# Patient Record
Sex: Female | Born: 1935
Health system: Southern US, Community
[De-identification: ages and names within clinical notes are randomized; demographics above are authoritative.]

## PROBLEM LIST (undated history)

## (undated) DIAGNOSIS — G629 Polyneuropathy, unspecified: Secondary | ICD-10-CM

## (undated) DIAGNOSIS — E119 Type 2 diabetes mellitus without complications: Secondary | ICD-10-CM

## (undated) DIAGNOSIS — J45909 Unspecified asthma, uncomplicated: Secondary | ICD-10-CM

## (undated) DIAGNOSIS — Z8709 Personal history of other diseases of the respiratory system: Secondary | ICD-10-CM

## (undated) DIAGNOSIS — K5730 Diverticulosis of large intestine without perforation or abscess without bleeding: Secondary | ICD-10-CM

## (undated) DIAGNOSIS — K635 Polyp of colon: Secondary | ICD-10-CM

## (undated) DIAGNOSIS — E039 Hypothyroidism, unspecified: Secondary | ICD-10-CM

## (undated) DIAGNOSIS — N183 Chronic kidney disease, stage 3 unspecified: Secondary | ICD-10-CM

## (undated) DIAGNOSIS — H409 Unspecified glaucoma: Secondary | ICD-10-CM

## (undated) DIAGNOSIS — I639 Cerebral infarction, unspecified: Secondary | ICD-10-CM

## (undated) DIAGNOSIS — R011 Cardiac murmur, unspecified: Secondary | ICD-10-CM

## (undated) DIAGNOSIS — M1990 Unspecified osteoarthritis, unspecified site: Secondary | ICD-10-CM

## (undated) DIAGNOSIS — I10 Essential (primary) hypertension: Secondary | ICD-10-CM

## (undated) DIAGNOSIS — Z972 Presence of dental prosthetic device (complete) (partial): Secondary | ICD-10-CM

## (undated) DIAGNOSIS — E785 Hyperlipidemia, unspecified: Secondary | ICD-10-CM

## (undated) DIAGNOSIS — Z974 Presence of external hearing-aid: Secondary | ICD-10-CM

## (undated) DIAGNOSIS — K8020 Calculus of gallbladder without cholecystitis without obstruction: Secondary | ICD-10-CM

## (undated) DIAGNOSIS — Z794 Long term (current) use of insulin: Secondary | ICD-10-CM

## (undated) DIAGNOSIS — F419 Anxiety disorder, unspecified: Secondary | ICD-10-CM

## (undated) DIAGNOSIS — D649 Anemia, unspecified: Secondary | ICD-10-CM

## (undated) DIAGNOSIS — Z8719 Personal history of other diseases of the digestive system: Secondary | ICD-10-CM

## (undated) DIAGNOSIS — K8050 Calculus of bile duct without cholangitis or cholecystitis without obstruction: Secondary | ICD-10-CM

## (undated) DIAGNOSIS — K259 Gastric ulcer, unspecified as acute or chronic, without hemorrhage or perforation: Secondary | ICD-10-CM

## (undated) HISTORY — DX: Hypothyroidism, unspecified: E03.9

## (undated) HISTORY — DX: Unspecified glaucoma: H40.9

## (undated) HISTORY — PX: ABDOMINAL HYSTERECTOMY: SHX81

## (undated) HISTORY — DX: Polyp of colon: K63.5

## (undated) HISTORY — PX: OTHER SURGICAL HISTORY: SHX169

## (undated) HISTORY — DX: Gastric ulcer, unspecified as acute or chronic, without hemorrhage or perforation: K25.9

## (undated) HISTORY — DX: Cerebral infarction, unspecified: I63.9

## (undated) HISTORY — DX: Unspecified asthma, uncomplicated: J45.909

---

## 2006-11-10 ENCOUNTER — Encounter: Payer: Self-pay | Admitting: Gastroenterology

## 2007-01-09 ENCOUNTER — Encounter: Payer: Self-pay | Admitting: Gastroenterology

## 2007-01-26 ENCOUNTER — Encounter: Payer: Self-pay | Admitting: Gastroenterology

## 2007-05-03 ENCOUNTER — Emergency Department (HOSPITAL_COMMUNITY): Admission: EM | Admit: 2007-05-03 | Discharge: 2007-05-03 | Payer: Self-pay | Admitting: Emergency Medicine

## 2008-09-24 ENCOUNTER — Encounter: Admission: RE | Admit: 2008-09-24 | Discharge: 2008-09-24 | Payer: Self-pay | Admitting: Family Medicine

## 2008-10-08 ENCOUNTER — Encounter: Admission: RE | Admit: 2008-10-08 | Discharge: 2008-10-08 | Payer: Self-pay | Admitting: Family Medicine

## 2008-12-20 ENCOUNTER — Encounter: Admission: RE | Admit: 2008-12-20 | Discharge: 2008-12-20 | Payer: Self-pay | Admitting: Family Medicine

## 2008-12-31 ENCOUNTER — Encounter: Admission: RE | Admit: 2008-12-31 | Discharge: 2008-12-31 | Payer: Self-pay | Admitting: Family Medicine

## 2009-01-14 ENCOUNTER — Encounter: Admission: RE | Admit: 2009-01-14 | Discharge: 2009-01-14 | Payer: Self-pay | Admitting: Neurosurgery

## 2009-07-28 ENCOUNTER — Encounter: Admission: RE | Admit: 2009-07-28 | Discharge: 2009-07-28 | Payer: Self-pay | Admitting: Family Medicine

## 2009-07-30 HISTORY — PX: CARDIOVASCULAR STRESS TEST: SHX262

## 2009-09-22 ENCOUNTER — Encounter: Admission: RE | Admit: 2009-09-22 | Discharge: 2009-09-22 | Payer: Self-pay | Admitting: Neurosurgery

## 2010-05-06 ENCOUNTER — Encounter: Admission: RE | Admit: 2010-05-06 | Discharge: 2010-05-06 | Payer: Self-pay | Admitting: Neurosurgery

## 2010-05-20 ENCOUNTER — Encounter: Admission: RE | Admit: 2010-05-20 | Discharge: 2010-05-20 | Payer: Self-pay | Admitting: Neurosurgery

## 2010-08-17 ENCOUNTER — Encounter (INDEPENDENT_AMBULATORY_CARE_PROVIDER_SITE_OTHER): Payer: Self-pay | Admitting: *Deleted

## 2010-08-25 ENCOUNTER — Encounter: Admission: RE | Admit: 2010-08-25 | Discharge: 2010-08-25 | Payer: Self-pay | Admitting: Family Medicine

## 2010-09-14 ENCOUNTER — Encounter (INDEPENDENT_AMBULATORY_CARE_PROVIDER_SITE_OTHER): Payer: Self-pay | Admitting: *Deleted

## 2010-09-16 ENCOUNTER — Encounter (INDEPENDENT_AMBULATORY_CARE_PROVIDER_SITE_OTHER): Payer: Self-pay | Admitting: *Deleted

## 2010-09-16 ENCOUNTER — Encounter: Admission: RE | Admit: 2010-09-16 | Discharge: 2010-09-16 | Payer: Self-pay | Admitting: Family Medicine

## 2010-09-16 ENCOUNTER — Ambulatory Visit: Payer: Self-pay | Admitting: Gastroenterology

## 2010-09-30 ENCOUNTER — Ambulatory Visit: Payer: Self-pay | Admitting: Gastroenterology

## 2010-12-20 ENCOUNTER — Encounter: Payer: Self-pay | Admitting: Family Medicine

## 2010-12-20 ENCOUNTER — Encounter: Payer: Self-pay | Admitting: Neurosurgery

## 2010-12-30 NOTE — Letter (Signed)
Summary: Diabetic Instructions  [facility]  [place], [place] [address]   Phone: [phone]  Fax: [phone]    Diana Harper [birth year] MRN: [id number]     X  _   INSULIN PUMP MEDICATION INSTRUCTIONS  We will contact the physician managing your diabetic care for written dosage instructions for the day before your procedure and the day of your procedure.  Once we have received the instructions, we will contact you.

## 2010-12-30 NOTE — Letter (Signed)
Summary: [facility] Instructions  [facility]  [place], [place] [address]   Phone: [phone]  Fax: [phone]       Diana Harper    1936/01/17    MRN: [id number]        Procedure Day Sudie Grumbling: Wednesday 09/30/10     Arrival Time: 9:30 AM      Procedure Time: 10:30 AM     Location of Procedure:                    _X_  [facility] (4th Floor)              [place]   Starting 5 days prior to your procedure _Friday 10/28/11_ do not eat nuts, seeds, popcorn, corn, beans, peas,  salads, or any raw vegetables.  Do not take any fiber supplements (e.g. Metamucil, Citrucel, and Benefiber).  THE DAY BEFORE YOUR PROCEDURE         DATE:  09/29/10  DAY: Tuesday    1.  Drink clear liquids the entire day-NO SOLID FOOD  2.  Do not drink anything colored red or purple.  Avoid juices with pulp.  No orange juice.  3.  Drink at least 64 oz. (8 glasses) of fluid/clear liquids during the day to prevent dehydration and help the prep work efficiently.  CLEAR LIQUIDS INCLUDE: Water Jello Ice Popsicles Tea (sugar ok, no milk/cream) Powdered fruit flavored drinks Coffee (sugar ok, no milk/cream) Gatorade Juice: apple, white grape, white cranberry  Lemonade Clear bullion, consomm, broth Carbonated beverages (any kind) Strained chicken noodle soup Hard Candy                             4.  In the morning, mix first dose of MoviPrep solution:    Empty 1 Pouch A and 1 Pouch B into the disposable container    Add lukewarm drinking water to the top line of the container. Mix to dissolve    Refrigerate (mixed solution should be used within 24 hrs)  5.  Begin drinking the prep at 5:00 p.m. The MoviPrep container is divided by 4 marks.   Every 15 minutes drink the solution down to the next mark (approximately 8 oz) until the full liter is complete.   6.  Follow completed prep with 16 oz of clear liquid of your choice  (Nothing red or purple).  Continue to drink clear liquids until bedtime.  7.  Before going to bed, mix second dose of MoviPrep solution:    Empty 1 Pouch A and 1 Pouch B into the disposable container    Add lukewarm drinking water to the top line of the container. Mix to dissolve    Refrigerate  THE DAY OF YOUR PROCEDURE      DATE: 09/30/10   DAY: Wednesday   Beginning at 5:30 a.m. (5 hours before procedure):         1. Every 15 minutes, drink the solution down to the next mark (approx 8 oz) until the full liter is complete.  2. Follow completed prep with 16 oz. of clear liquid of your choice.    3. You may drink clear liquids until 8:30 AM (2 HOURS BEFORE PROCEDURE).   MEDICATION INSTRUCTIONS  Unless otherwise instructed, you should take regular prescription medications with a small sip of water   as early as possible the morning of your procedure.  Diabetic patients - see separate instructions.   Additional medication instructions: Hold HCTZ the morning  of procedure.         OTHER INSTRUCTIONS  You will need a responsible adult at least 73 years of age to accompany you and drive you home.   This person must remain in the waiting room during your procedure.  Wear loose fitting clothing that is easily removed.  Leave jewelry and other valuables at home.  However, you may wish to bring a book to read or  an iPod/MP3 player to listen to music as you wait for your procedure to start.  Remove all body piercing jewelry and leave at home.  Total time from sign-in until discharge is approximately 2-3 hours.  You should go home directly after your procedure and rest.  You can resume normal activities the  day after your procedure.  The day of your procedure you should not:   Drive   Make legal decisions   Operate machinery   Drink alcohol   Return to work  You will receive specific instructions about eating, activities and medications before you  leave.    The above instructions have been reviewed and explained to me by   Emerson Monte RN  September 16, 2010 8:47 AM     I fully understand and can verbalize these instructions _____________________________ Date _________

## 2010-12-30 NOTE — Miscellaneous (Signed)
Summary: [facility] Previsit/prep  Clinical Lists Changes  Medications: Added new medication of MOVIPREP 100 GM  SOLR (PEG-KCL-NACL-NASULF-NA ASC-C) As per prep instructions. - Signed Rx of MOVIPREP 100 GM  SOLR (PEG-KCL-NACL-NASULF-NA ASC-C) As per prep instructions.;  #1 x 0;  Signed;  Entered by: Emerson Monte RN;  Authorized by: Sable Feil MD [facility];  Method used: Electronically to [facility] [address], [place], [place], [place]  [address], Ph: [phone], Fax: [phone] Allergies: Added new allergy or adverse reaction of * LATEX Added new allergy or adverse reaction of ZOCOR Added new allergy or adverse reaction of TETRACYCLINE Added new allergy or adverse reaction of ACCROMYCIN Observations: Added new observation of NKA: F (09/16/2010 8:03)    Prescriptions: MOVIPREP 100 GM  SOLR (PEG-KCL-NACL-NASULF-NA ASC-C) As per prep instructions.  #1 x 0   Entered by:   Emerson Monte RN   Authorized by:   Sable Feil MD [facility]   Signed by:   Emerson Monte RN on 09/16/2010   Method used:   Electronically to        [facility] (retail)       [address]       [place], [place]  [address]       Ph: [phone]       Fax: [phone]   RxID:   [id number]

## 2010-12-30 NOTE — Procedures (Signed)
Summary: Colonoscopy  Patient: Diana Harper Note: All result statuses are Final unless otherwise noted.  Tests: (1) Colonoscopy (COL)   COL Colonoscopy           [facility]     [place], [place]  [id number]           COLONOSCOPY PROCEDURE REPORT           PATIENT:  Diana, Harper  MR#:  [id number]     BIRTHDATE:  [birth year], 84 yrs. old  GENDER:  female     ENDOSCOPIST:  Loralee Pacas. Sharlett Iles, MD, [facility]     REF. BY:  Rachell Cipro, M.D.     PROCEDURE DATE:  09/30/2010     PROCEDURE:  Higher-risk screening colonoscopy G0105           ASA CLASS:  Class III     INDICATIONS:  history of polyps RECENT DIVERTICULITIS.     MEDICATIONS:   Fentanyl 75 mcg IV, Versed 8 mg IV           DESCRIPTION OF PROCEDURE:   After the risks benefits and     alternatives of the procedure were thoroughly explained, informed     consent was obtained.  Digital rectal exam was performed and     revealed no abnormalities.   The LB CF-[id number] endoscope     was introduced through the anus and advanced to the cecum, which     was identified by both the appendix and ileocecal valve, without     limitations.  The quality of the prep was excellent, using     MoviPrep.  The instrument was then slowly withdrawn as the colon     was fully examined.     <<PROCEDUREIMAGES>>           FINDINGS:  Moderate diverticulosis was found in the sigmoid to     descending colon segments.  No polyps or cancers were seen.     Retroflexed views in the rectum revealed no abnormalities.    The     scope was then withdrawn from the patient and the procedure     completed.           COMPLICATIONS:  None     ENDOSCOPIC IMPRESSION:     1) Moderate diverticulosis in the sigmoid to descending colon     segments     2) No polyps or cancers     RECOMMENDATIONS:     1) high fiber diet     2) Continue current colorectal screening recommendations for     "routine risk" patients with a repeat  colonoscopy in 10 years.     REPEAT EXAM:  No           ______________________________     Loralee Pacas. Sharlett Iles, MD, [facility]           CC:           n.     eSIGNED:   Loralee Pacas. Aundreya Souffrant at 09/30/2010 10:51 AM           [name], [id number]  Note: An exclamation mark (!) indicates a result that was not dispersed into the flowsheet. Document Creation Date: 09/30/2010 10:52 AM _______________________________________________________________________  (1) Order result status: Final Collection or observation date-time: 09/30/2010 10:43 Requested date-time:  Receipt date-time:  Reported date-time:  Referring Physician:   Ordering Physician: Verl Blalock ([phone]) Specimen Source:  Source: Tawanna Cooler Order Number: [id number] Lab site:   Appended Document: Colonoscopy    Clinical Lists Changes  Observations: Added new observation of COLONNXTDUE: 09/2020 (09/30/2010 12:04)

## 2010-12-30 NOTE — Procedures (Signed)
Summary: [facility]  [facility]   Imported By: Bubba Hales 10/01/2010 10:17:58  _____________________________________________________________________  External Attachment:    Type:   Image     Comment:   External Document

## 2010-12-30 NOTE — Letter (Signed)
Summary: Insulin pump letter-Colonoscopy  [facility]  [place], [place] [address]   Phone: [phone]  Fax: [phone]      Date: September 16, 2010  Re: Diana Harper DOB: 12/21/1935 MRN: [id number]     Dear Dr. Ernie Hew:   Dr. Sharlett Iles has scheduled the above patient for a colonoscopy at 10:30 am on      October 01, 2010.  Our records show that she is on insulin therapy via an insulin pump.  Our colonoscopy prep protocol requires that:   the patient must be on a clear liquid diet the entire day prior to the procedure date as well as the morning of the procedure   the patient must be NPO for 2 hours prior to the procedure    the patient must consume a PEG 3350 solution to prepare for the procedure.  Please advise [name] of any adjustments that need to be made to the patient's insulin pump therapy prior to the above procedure date.    Please fax this completed form to [facility] [place] at [phone].    If you have any question, please call me at [phone].  Thank you for your help with this matter.  Sincerely,  Abelino Derrick, CMA Deborra Medina)    Physician Recommendation:  ________________________________________________  ________________________________________________________________________  ________________________________________________________________________  ________________________________________________________________________  Appended Document: Insulin pump letter-Colonoscopy Left a message on for the nurse to call me back about the letter.   Appended Document: Insulin pump letter-Colonoscopy letter with adjustments sent down to be scanned and a copy for [name] chart given to [employer]. I called pt to make sure she understood the adjustments she does.

## 2010-12-30 NOTE — Letter (Signed)
Summary: [facility] pump [facility]  Insulin pump therapy/[facility]   Imported By: Phillis Knack 09/28/2010 10:11:44  _____________________________________________________________________  External Attachment:    Type:   Image     Comment:   External Document

## 2010-12-30 NOTE — Letter (Signed)
Summary: Pre Visit Letter Revised  [facility]  [place], [place] [address]   Phone: [phone]  Fax: [phone]        08/17/2010 MRN: [id number] Diana Harper [address] [place], [place]  [address]             Procedure Date:  09/30/2010   Welcome to the Gastroenterology Division at [facility].    You are scheduled to see a nurse for your pre-procedure visit on 09/16/2010 at 8:00AM on the 3rd floor at [facility], [place] [employer].  We ask that you try to arrive at our office 15 minutes prior to your appointment time to allow for check-in.  Please take a minute to review the attached form.  If you answer "Yes" to one or more of the questions on the first page, we ask that you call the person listed at your earliest opportunity.  If you answer "No" to all of the questions, please complete the rest of the form and bring it to your appointment.    Your nurse visit will consist of discussing your medical and surgical history, your immediate family medical history, and your medications.   If you are unable to list all of your medications on the form, please bring the medication bottles to your appointment and we will list them.  We will need to be aware of both prescribed and over the counter drugs.  We will need to know exact dosage information as well.    Please be prepared to read and sign documents such as consent forms, a financial agreement, and acknowledgement forms.  If necessary, and with your consent, a friend or relative is welcome to sit-in on the nurse visit with you.  Please bring your insurance card so that we may make a copy of it.  If your insurance requires a referral to see a specialist, please bring your referral form from your primary care physician.  No co-pay is required for this nurse visit.     If you cannot keep your appointment, please call [phone] to cancel or reschedule prior to your appointment date.  This  allows [name] the opportunity to schedule an appointment for another patient in need of care.    Thank you for choosing [facility] for your medical needs.  We appreciate the opportunity to care for you.  Please visit [name] at our website  to learn more about our practice.  Sincerely, The Gastroenterology Division

## 2010-12-30 NOTE — Letter (Signed)
[facility]   Imported By: Bubba Hales 10/01/2010 10:19:14  _____________________________________________________________________  External Attachment:    Type:   Image     Comment:   External Document

## 2010-12-30 NOTE — Letter (Signed)
[facility]   Imported By: Bubba Hales 10/01/2010 10:20:31  _____________________________________________________________________  External Attachment:    Type:   Image     Comment:   External Document

## 2011-02-09 LAB — GLUCOSE, CAPILLARY
Glucose-Capillary: 118 mg/dL — ABNORMAL HIGH (ref 70–99)
Glucose-Capillary: 126 mg/dL — ABNORMAL HIGH (ref 70–99)

## 2011-09-16 LAB — I-STAT 8, (EC8 V) (CONVERTED LAB)
BUN: 26 — ABNORMAL HIGH
Bicarbonate: 27.1 — ABNORMAL HIGH
Glucose, Bld: 250 — ABNORMAL HIGH
pCO2, Ven: 47

## 2011-09-16 LAB — POCT CARDIAC MARKERS: Myoglobin, poc: 93.6

## 2011-09-16 LAB — POCT I-STAT CREATININE: Creatinine, Ser: 1.5 — ABNORMAL HIGH

## 2011-11-14 ENCOUNTER — Other Ambulatory Visit: Payer: Self-pay

## 2011-11-14 ENCOUNTER — Emergency Department (HOSPITAL_COMMUNITY): Payer: Medicare Other

## 2011-11-14 ENCOUNTER — Observation Stay (HOSPITAL_COMMUNITY)
Admission: EM | Admit: 2011-11-14 | Discharge: 2011-11-15 | Disposition: A | Discharge status: Home / Self Care | Payer: Medicare Other | Attending: Family Medicine | Admitting: Family Medicine

## 2011-11-14 ENCOUNTER — Encounter: Payer: Self-pay | Admitting: Adult Health

## 2011-11-14 DIAGNOSIS — R55 Syncope and collapse: Principal | ICD-10-CM | POA: Diagnosis present

## 2011-11-14 DIAGNOSIS — K5732 Diverticulitis of large intestine without perforation or abscess without bleeding: Secondary | ICD-10-CM | POA: Insufficient documentation

## 2011-11-14 DIAGNOSIS — Z79899 Other long term (current) drug therapy: Secondary | ICD-10-CM | POA: Insufficient documentation

## 2011-11-14 DIAGNOSIS — R109 Unspecified abdominal pain: Secondary | ICD-10-CM | POA: Insufficient documentation

## 2011-11-14 DIAGNOSIS — K5730 Diverticulosis of large intestine without perforation or abscess without bleeding: Secondary | ICD-10-CM | POA: Insufficient documentation

## 2011-11-14 DIAGNOSIS — Z794 Long term (current) use of insulin: Secondary | ICD-10-CM | POA: Insufficient documentation

## 2011-11-14 DIAGNOSIS — K5792 Diverticulitis of intestine, part unspecified, without perforation or abscess without bleeding: Secondary | ICD-10-CM | POA: Diagnosis present

## 2011-11-14 DIAGNOSIS — R51 Headache: Secondary | ICD-10-CM | POA: Insufficient documentation

## 2011-11-14 HISTORY — DX: Essential (primary) hypertension: I10

## 2011-11-14 LAB — DIFFERENTIAL
Basophils Relative: 0 % (ref 0–1)
Lymphs Abs: 1.9 10*3/uL (ref 0.7–4.0)
Monocytes Relative: 7 % (ref 3–12)
Neutro Abs: 6.5 10*3/uL (ref 1.7–7.7)
Neutrophils Relative %: 71 % (ref 43–77)

## 2011-11-14 LAB — POCT I-STAT, CHEM 8
BUN: 26 mg/dL — ABNORMAL HIGH (ref 6–23)
Chloride: 106 mEq/L (ref 96–112)
Creatinine, Ser: 1.8 mg/dL — ABNORMAL HIGH (ref 0.50–1.10)
Glucose, Bld: 78 mg/dL (ref 70–99)
Hemoglobin: 11.6 g/dL — ABNORMAL LOW (ref 12.0–15.0)
Potassium: 3.7 mEq/L (ref 3.5–5.1)
Sodium: 143 mEq/L (ref 135–145)

## 2011-11-14 LAB — CBC
Hemoglobin: 10.6 g/dL — ABNORMAL LOW (ref 12.0–15.0)
MCHC: 33.3 g/dL (ref 30.0–36.0)
Platelets: 244 10*3/uL (ref 150–400)
RBC: 3.56 MIL/uL — ABNORMAL LOW (ref 3.87–5.11)

## 2011-11-14 LAB — POCT I-STAT TROPONIN I

## 2011-11-14 LAB — GLUCOSE, CAPILLARY
Glucose-Capillary: 81 mg/dL (ref 70–99)
Glucose-Capillary: 65 mg/dL — ABNORMAL LOW (ref 70–99)

## 2011-11-14 MED ORDER — ONDANSETRON HCL 4 MG/2ML IJ SOLN
4.0000 mg | Freq: Once | INTRAMUSCULAR | Status: AC
  Administered 2011-11-14: 4 mg via INTRAVENOUS
  Filled 2011-11-14: qty 2

## 2011-11-14 MED ORDER — SODIUM CHLORIDE 0.9 % IV BOLUS (SEPSIS)
500.0000 mL | Freq: Once | INTRAVENOUS | Status: AC
  Administered 2011-11-14: 500 mL via INTRAVENOUS

## 2011-11-14 MED ORDER — MORPHINE SULFATE 2 MG/ML IJ SOLN
2.0000 mg | Freq: Once | INTRAMUSCULAR | Status: AC
  Administered 2011-11-14: 2 mg via INTRAVENOUS
  Filled 2011-11-14: qty 1

## 2011-11-14 MED ORDER — DEXTROSE 50 % IV SOLN
25.0000 mL | Freq: Once | INTRAVENOUS | Status: AC
  Administered 2011-11-14: 25 mL via INTRAVENOUS
  Filled 2011-11-14: qty 50

## 2011-11-14 MED ORDER — ONDANSETRON HCL 4 MG/2ML IJ SOLN: 4.0000 mg | Freq: Three times a day (TID) | INTRAMUSCULAR | Status: DC | PRN

## 2011-11-14 NOTE — ED Provider Notes (Signed)
8:00 PM patient discussed in sign out. Patient awaiting CT scan of head and abdomen. Patient with questionable syncopal/near syncopal episode while having BM. Will await results.  11:30 PM spoke with triad hospitalists. They will see patient and admit to observation bed on telemetry floor under Dr. Wendee Beavers.  Martie Lee, [place] 11/14/11 [phone]

## 2011-11-14 NOTE — ED Notes (Signed)
Called EMS due to syncope while sitting on toilet. Pt became light headed and had tunnel vision. CBG 88, pt c/o lower abdominal pain and back pain, headache. Denies Dysuria and hematuria.

## 2011-11-14 NOTE — ED Notes (Signed)
Blood glucose is 65 . Made md aware and order was given

## 2011-11-14 NOTE — ED Provider Notes (Signed)
History     CSN: [id number] Arrival date & time: 11/14/2011  4:14 PM   First MD Initiated Contact with Patient 11/14/11 1751      Chief Complaint  Patient presents with  . Near Syncope  . Abdominal Pain    (Consider location/radiation/quality/duration/timing/severity/associated sxs/prior treatment) Patient is a 75 y.o. female presenting with abdominal pain. The history is provided by the patient and a relative.  Abdominal Pain The primary symptoms of the illness include abdominal pain. The primary symptoms of the illness do not include fever, nausea, vomiting, diarrhea or dysuria. The current episode started 6 to 12 hours ago. The onset of the illness was gradual. The problem has been rapidly worsening.  Associated with: She had been having lower abdominal pain since this morning. She went to the bathroom and strained for a bowel movement. She then became diaphoretic and had a near syncopal episode. The patient has not had a change in bowel habit. Risk factors for an acute abdominal problem include being elderly. Symptoms associated with the illness do not include chills. Associated symptoms comments: At this time, she reports "I just feel so bad". No nausea, bloody bowel movements, fever. She does complain of back pain, also since this morning..    Past Medical History  Diagnosis Date  . Back pain   . Hypertension   . Asthma   . Diabetes mellitus     Past Surgical History  Procedure Date  . Abdominal hysterectomy     History reviewed. No pertinent family history.  History  Substance Use Topics  . Smoking status: Never Smoker   . Smokeless tobacco: Not on file  . Alcohol Use: No    OB History    Grav Para Term Preterm Abortions TAB SAB Ect Mult Living                  Review of Systems  Constitutional: Negative for fever and chills.  Respiratory: Negative.   Cardiovascular: Negative.   Gastrointestinal: Positive for abdominal pain. Negative for nausea, vomiting,  diarrhea and blood in stool.  Genitourinary: Negative for dysuria.  Musculoskeletal: Negative.   Skin: Negative.   Neurological: Positive for weakness, light-headedness and headaches.    Allergies  Latex; Simvastatin; and Tetracycline  Home Medications   Current Outpatient Rx  Name Route Sig Dispense Refill  . AMLODIPINE BESYLATE 5 MG PO TABS Oral Take 5 mg by mouth daily.      [name] BIOTIN PO Oral Take 1 tablet by mouth daily.      [name] CALCIUM CARBONATE 600 MG PO TABS Oral Take 600 mg by mouth daily.      [name] GLIMEPIRIDE 4 MG PO TABS Oral Take 4 mg by mouth daily before breakfast.      . HYDROCHLOROTHIAZIDE 12.5 MG PO CAPS Oral Take 12.5 mg by mouth daily.      . INSULIN ASPART PROT & ASPART (70-30) 100 UNIT/ML [place] SUSP Subcutaneous Inject 10 Units into the skin 2 (two) times daily with a meal.      . INSULIN GLARGINE 100 UNIT/ML [place] SOLN Subcutaneous Inject 60 Units into the skin at bedtime.      [name] LOSARTAN POTASSIUM 100 MG PO TABS Oral Take 100 mg by mouth daily.      . TRAMADOL HCL 50 MG PO TABS Oral Take 50 mg by mouth every 6 (six) hours as needed. Pain.Marland KitchenMarland KitchenMaximum dose= 8 tablets per day       BP 107/59  Pulse 77  Temp(Src)  99 F (37.2 C) (Oral)  Resp 20  SpO2 100%  Physical Exam  Constitutional: She appears well-developed and well-nourished.       Uncomfortable appearing.  HENT:  Head: Normocephalic.  Neck: Normal range of motion. Neck supple.  Cardiovascular: Normal rate and regular rhythm.   Pulmonary/Chest: Effort normal and breath sounds normal.  Abdominal: Soft. Bowel sounds are normal. There is no rebound and no guarding.  Musculoskeletal: Normal range of motion.  Neurological: She is alert. No cranial nerve deficit.  Skin: Skin is warm and dry. No rash noted.  Psychiatric: She has a normal mood and affect.    ED Course  Procedures (including critical care time)   Labs Reviewed  POCT CBG MONITORING  GLUCOSE, CAPILLARY  CBC  DIFFERENTIAL  URINALYSIS, ROUTINE W  REFLEX MICROSCOPIC  URINE CULTURE  I-STAT, CHEM 8  I-STAT TROPONIN I  OCCULT BLOOD X 1 CARD TO LAB, STOOL   No results found.   No diagnosis found.    MDM  Patient care left to Dr. Tomi Bamberger, who has seen the patient, and with Hazel Sams, PA-C for disposition after CT scans resulted.        Leotis Shames, PA 11/14/11 [phone]

## 2011-11-14 NOTE — ED Notes (Signed)
Patient taken to bathroom to void unable to void at this time. Patient aware of need for urine specimen. Patient advised to call when needing to void.

## 2011-11-14 NOTE — ED Provider Notes (Signed)
Patient presents with abdominal pain and syncope. Physical Exam  BP 116/57  Pulse 82  Temp(Src) 99 F (37.2 C) (Oral)  Resp 20  SpO2 100%  Physical Exam  Nursing note and vitals reviewed. Abdominal: Soft. Normal appearance. She exhibits no pulsatile midline mass. There is tenderness in the suprapubic area. There is no guarding. No hernia.    ED Course  Procedures  Date: 11/14/2011  Rate: 78  Rhythm: normal sinus rhythm  QRS Axis: normal  Intervals: normal  ST/T Wave abnormalities: normal  Conduction Disutrbances:none  Narrative Interpretation:   Old EKG Reviewed: unchanged   MDM Pt with abdominal pain and syncope.  No pulsatile mass on exam and prior ct did not show aneurysm , will include abdominal CT imaging today.      Kathalene Frames, MD 11/14/11 [phone]

## 2011-11-15 DIAGNOSIS — K5792 Diverticulitis of intestine, part unspecified, without perforation or abscess without bleeding: Secondary | ICD-10-CM | POA: Diagnosis present

## 2011-11-15 DIAGNOSIS — R55 Syncope and collapse: Secondary | ICD-10-CM | POA: Diagnosis present

## 2011-11-15 LAB — URINALYSIS, ROUTINE W REFLEX MICROSCOPIC
Hgb urine dipstick: NEGATIVE
Specific Gravity, Urine: 1.004 — ABNORMAL LOW (ref 1.005–1.030)
Urobilinogen, UA: 0.2 mg/dL (ref 0.0–1.0)

## 2011-11-15 LAB — GLUCOSE, CAPILLARY: Glucose-Capillary: 93 mg/dL (ref 70–99)

## 2011-11-15 LAB — CBC
HCT: 29.6 % — ABNORMAL LOW (ref 36.0–46.0)
MCH: 29.7 pg (ref 26.0–34.0)
MCHC: 33.4 g/dL (ref 30.0–36.0)
MCV: 88.9 fL (ref 78.0–100.0)
Platelets: 223 10*3/uL (ref 150–400)
RDW: 12.5 % (ref 11.5–15.5)
WBC: 8.9 10*3/uL (ref 4.0–10.5)

## 2011-11-15 LAB — BASIC METABOLIC PANEL
BUN: 21 mg/dL (ref 6–23)
CO2: 29 mEq/L (ref 19–32)
Calcium: 9.3 mg/dL (ref 8.4–10.5)
Chloride: 103 mEq/L (ref 96–112)
Creatinine, Ser: 1.6 mg/dL — ABNORMAL HIGH (ref 0.50–1.10)

## 2011-11-15 LAB — CARDIAC PANEL(CRET KIN+CKTOT+MB+TROPI)
Relative Index: 1.1 (ref 0.0–2.5)
Relative Index: 1.1 (ref 0.0–2.5)
Total CK: 307 U/L — ABNORMAL HIGH (ref 7–177)
Total CK: 302 U/L — ABNORMAL HIGH (ref 7–177)

## 2011-11-15 LAB — URINE MICROSCOPIC-ADD ON

## 2011-11-15 MED ORDER — OXYCODONE HCL 5 MG PO TABS
5.0000 mg | ORAL_TABLET | ORAL | Status: DC | PRN
  Administered 2011-11-15 (×2): 5 mg via ORAL
  Filled 2011-11-15 (×2): qty 1

## 2011-11-15 MED ORDER — HYDROCODONE-ACETAMINOPHEN 5-500 MG PO TABS: 1.0000 | ORAL_TABLET | Freq: Four times a day (QID) | ORAL | Status: DC | PRN

## 2011-11-15 MED ORDER — ONDANSETRON HCL 4 MG/2ML IJ SOLN: 4.0000 mg | Freq: Four times a day (QID) | INTRAMUSCULAR | Status: DC | PRN

## 2011-11-15 MED ORDER — HYDROCODONE-ACETAMINOPHEN 5-500 MG PO TABS
1.0000 | ORAL_TABLET | Freq: Four times a day (QID) | ORAL | Status: DC | PRN
Start: 2011-11-15 — End: 2011-11-15

## 2011-11-15 MED ORDER — CIPROFLOXACIN HCL 500 MG PO TABS: 500.0000 mg | ORAL_TABLET | Freq: Two times a day (BID) | ORAL | Status: AC

## 2011-11-15 MED ORDER — ALUM & MAG HYDROXIDE-SIMETH 200-200-20 MG/5ML PO SUSP: 30.0000 mL | Freq: Four times a day (QID) | ORAL | Status: DC | PRN

## 2011-11-15 MED ORDER — ZOLPIDEM TARTRATE 5 MG PO TABS: 5.0000 mg | ORAL_TABLET | Freq: Every evening | ORAL | Status: DC | PRN

## 2011-11-15 MED ORDER — INSULIN ASPART 100 UNIT/ML ~~LOC~~ SOLN: 0.0000 [IU] | Freq: Every day | SUBCUTANEOUS | Status: DC

## 2011-11-15 MED ORDER — INSULIN ASPART 100 UNIT/ML ~~LOC~~ SOLN
0.0000 [IU] | Freq: Three times a day (TID) | SUBCUTANEOUS | Status: DC
  Administered 2011-11-15: 2 [IU] via SUBCUTANEOUS
  Administered 2011-11-15: 3 [IU] via SUBCUTANEOUS
  Filled 2011-11-15: qty 3

## 2011-11-15 MED ORDER — METRONIDAZOLE 500 MG PO TABS: 500.0000 mg | ORAL_TABLET | Freq: Three times a day (TID) | ORAL | Status: AC

## 2011-11-15 MED ORDER — HYDROCODONE-ACETAMINOPHEN 5-500 MG PO TABS: 1.0000 | ORAL_TABLET | Freq: Four times a day (QID) | ORAL | Status: AC | PRN

## 2011-11-15 MED ORDER — ONDANSETRON HCL 4 MG PO TABS: 4.0000 mg | ORAL_TABLET | Freq: Four times a day (QID) | ORAL | Status: DC | PRN

## 2011-11-15 MED ORDER — SODIUM CHLORIDE 0.9 % IV SOLN
INTRAVENOUS | Status: DC
  Administered 2011-11-15 (×2): via INTRAVENOUS

## 2011-11-15 MED ORDER — ENOXAPARIN SODIUM 40 MG/0.4ML ~~LOC~~ SOLN
40.0000 mg | SUBCUTANEOUS | Status: DC
  Administered 2011-11-15: 40 mg via SUBCUTANEOUS
  Filled 2011-11-15: qty 0.4

## 2011-11-15 MED ORDER — CIPROFLOXACIN IN D5W 200 MG/100ML IV SOLN
200.0000 mg | Freq: Two times a day (BID) | INTRAVENOUS | Status: DC
  Administered 2011-11-15 (×2): 200 mg via INTRAVENOUS
  Filled 2011-11-15 (×4): qty 100

## 2011-11-15 MED ORDER — HYDROMORPHONE HCL PF 1 MG/ML IJ SOLN: 0.5000 mg | INTRAMUSCULAR | Status: DC | PRN

## 2011-11-15 MED ORDER — ACETAMINOPHEN 650 MG RE SUPP: 650.0000 mg | Freq: Four times a day (QID) | RECTAL | Status: DC | PRN

## 2011-11-15 MED ORDER — METRONIDAZOLE IN NACL 5-0.79 MG/ML-% IV SOLN
500.0000 mg | Freq: Three times a day (TID) | INTRAVENOUS | Status: DC
  Administered 2011-11-15 (×2): 500 mg via INTRAVENOUS
  Filled 2011-11-15 (×5): qty 100

## 2011-11-15 MED ORDER — ACETAMINOPHEN 325 MG PO TABS: 650.0000 mg | ORAL_TABLET | Freq: Four times a day (QID) | ORAL | Status: DC | PRN

## 2011-11-15 NOTE — ED Notes (Signed)
Attempt to call report to 5E unsuccessful; receiving RN will return call to ED when available for report.

## 2011-11-15 NOTE — ED Notes (Signed)
Jenkins. MD at bedside.

## 2011-11-15 NOTE — Progress Notes (Signed)
Subjective: Pt feels better mentions that she felt like she was going to pass out yesterday when she sat down to have a bowel movement.  Denies any falling, palpitations, urine or fecal incontinence.  States that in seconds it self resolved.  Currently denies any palpitations, chest pain, fever, chills.  But has had some abdominal discomfort.  Objective: Filed Vitals:   11/15/11 0132 11/15/11 0550 11/15/11 1018 11/15/11 1355  BP: 123/73 120/76 113/55 121/52  Pulse: 81 77 88 87  Temp: 98.2 F (36.8 C) 98.1 F (36.7 C) 98.6 F (37 C) 98.1 F (36.7 C)  TempSrc: Oral Oral Oral Oral  Resp: 18 18 18 18   Height: 5\' 6"  (1.676 m)     Weight: 74.571 kg (164 lb 6.4 oz)     SpO2: 97% 94% 98% 94%   Weight change:   Intake/Output Summary (Last 24 hours) at 11/15/11 1451 Last data filed at 11/15/11 1354  Gross per 24 hour  Intake   1995 ml  Output      1 ml  Net   1994 ml    General: Alert, awake, oriented x3, in no acute distress.  HEENT: No bruits, no goiter.  Heart: Regular rate and rhythm, without murmurs, rubs, gallops.  Lungs: CTA BL Abdomen: Soft, Left lower quadrant pain no guarding, nondistended, positive bowel sounds.  Neuro: Grossly intact, nonfocal.   Lab Results:  Basename 11/15/11 0157 11/14/11 1830  NA 138 143  K 3.6 3.7  CL 103 106  CO2 29 --  GLUCOSE 106* 78  BUN 21 26*  CREATININE 1.60* 1.80*  CALCIUM 9.3 --  MG -- --  PHOS -- --   No results found for this basename: AST:2,ALT:2,ALKPHOS:2,BILITOT:2,PROT:2,ALBUMIN:2 in the last 72 hours No results found for this basename: LIPASE:2,AMYLASE:2 in the last 72 hours  Basename 11/15/11 0157 11/14/11 1830 11/14/11 1818  WBC 8.9 -- 9.2  NEUTROABS -- -- 6.5  HGB 9.9* 11.6* --  HCT 29.6* 34.0* --  MCV 88.9 -- 89.3  PLT 223 -- 244    Basename 11/15/11 0930 11/15/11 0157  CKTOTAL 302* 307*  CKMB 3.2 3.4  CKMBINDEX -- --  TROPONINI <0.30 <0.30   No components found with this basename: POCBNP:3 No results  found for this basename: DDIMER:2 in the last 72 hours  Basename 11/15/11 0156  HGBA1C 9.4*   No results found for this basename: CHOL:2,HDL:2,LDLCALC:2,TRIG:2,CHOLHDL:2,LDLDIRECT:2 in the last 72 hours No results found for this basename: TSH,T4TOTAL,FREET3,T3FREE,THYROIDAB in the last 72 hours No results found for this basename: VITAMINB12:2,FOLATE:2,FERRITIN:2,TIBC:2,IRON:2,RETICCTPCT:2 in the last 72 hours  Micro Results: No results found for this or any previous visit (from the past 240 hour(s)).  Studies/Results: Ct Abdomen Pelvis Wo Contrast  11/14/2011  *RADIOLOGY REPORT*  Clinical Data: Abdominal pain, history of diverticulitis  CT ABDOMEN AND PELVIS WITHOUT CONTRAST  Technique:  Multidetector CT imaging of the abdomen and pelvis was performed following the standard protocol without intravenous contrast.  Comparison: [facility] CT abdomen pelvis dated 12/20/2008  Findings: Mild subpleural reticulation in the lung bases.  Unenhanced liver, spleen, pancreas, and adrenal glands are within normal limits.  Gallbladder is grossly unremarkable.  No intrahepatic or extrahepatic ductal dilatation.  Kidneys are within normal limits.  No renal calculi or hydronephrosis.  No evidence of bowel obstruction.  Normal appendix.  Extensive colonic diverticulosis with mild inflammatory stranding involving a loop of sigmoid colon, compatible with sigmoid diverticulitis (series 2/image 65).  No drainable fluid collection or abscess.  No free air.  No evidence of abdominal aortic aneurysm.  No abdominopelvic ascites.  No suspicious abdominopelvic lymphadenopathy.  Uterus and bilateral ovaries are unremarkable.  Bladder is within normal limits.  Degenerative changes of the visualized thoracolumbar spine.  IMPRESSION: Mild sigmoid diverticulitis.  No drainable fluid collection or abscess.  No free air.  Original Report Authenticated By: Julian Hy, M.D.   Ct Head Wo Contrast  11/14/2011  *RADIOLOGY  REPORT*  Clinical Data: Headache, near syncope  CT HEAD WITHOUT CONTRAST  Technique:  Contiguous axial images were obtained from the base of the skull through the vertex without contrast.  Comparison: [facility] CT head dated 09/24/2008  Findings: No evidence of parenchymal hemorrhage or extra-axial fluid collection. No mass lesion, mass effect, or midline shift.  No CT evidence of acute infarction.  Mild subcortical white matter and periventricular small vessel ischemic changes.  Cerebral volume is age appropriate.  No ventriculomegaly.  The visualized paranasal sinuses are essentially clear. The mastoid air cells are unopacified.  No evidence of calvarial fracture.  IMPRESSION: No evidence of acute intracranial abnormality.  Mild small vessel ischemic changes.  Original Report Authenticated By: Julian Hy, M.D.    Medications: I have reviewed the patient's current medications.  Syncope:  Pt has been on Tele.  No arrythmias present on monitor and patient's ecg was sinus rhythm as well.  Pt has not had any other syncopal episodes while observed at the hospital  Diverticulitis:  Will plan on discharging patient on cipro and metronidazole for a 10 day total course.      LOS: 1 day   Velvet Bathe M.D.  [facility] 11/15/2011, 2:51 PM

## 2011-11-15 NOTE — H&P (Signed)
DATE OF ADMISSION:  11/15/2011  PCP:   Dr. Rachell Cipro  Chief Complaint:  Passed out   HPI: Diana Harper is an 75 y.o. female who complains of passing out while on the commode at about 3 pm PTA. When she regained consciousness she called her daughter who called EMS.  She reports also having increased lower Abdominal pain for the past 2 days, especially in the left lower quadrant.  She reports taking her tramadol which she takes for her chronic back pain, which gave her some temporary relief.  She reports having nausea as well but no vomiting.    She denies having fevers or chills, and also denies having any hematemesis, hematochezia or melena passage.    Past Medical History  Diagnosis Date  . Back pain   . Hypertension   . Asthma   . Diabetes mellitus     Past Surgical History  Procedure Date  . Abdominal hysterectomy        Bilateral Cataract Surgeries      Cyst Excision LUE  Medications:  HOME MEDS: Prior to Admission medications   Medication Sig Start Date End Date Taking? Authorizing Provider  amLODipine (NORVASC) 5 MG tablet Take 5 mg by mouth daily.     Yes Historical Provider, MD  BIOTIN PO Take 1 tablet by mouth daily.     Yes Historical Provider, MD  calcium carbonate (OS-CAL) 600 MG TABS Take 600 mg by mouth daily.     Yes Historical Provider, MD  glimepiride (AMARYL) 4 MG tablet Take 4 mg by mouth daily before breakfast.     Yes Historical Provider, MD  hydrochlorothiazide (MICROZIDE) 12.5 MG capsule Take 12.5 mg by mouth daily.     Yes Historical Provider, MD  insulin aspart protamine-insulin aspart (NOVOLOG 70/30) (70-30) 100 UNIT/ML injection Inject 10 Units into the skin 2 (two) times daily with a meal.     Yes Historical Provider, MD  insulin glargine (LANTUS) 100 UNIT/ML injection Inject 60 Units into the skin at bedtime.     Yes Historical Provider, MD  losartan (COZAAR) 100 MG tablet Take 100 mg by mouth daily.     Yes Historical Provider, MD  traMADol  (ULTRAM) 50 MG tablet Take 50 mg by mouth every 6 (six) hours as needed. Pain.Marland KitchenMarland KitchenMaximum dose= 8 tablets per day    Yes Historical Provider, MD    Allergies:  Allergies  Allergen Reactions  . Latex     REACTION: severe respiratory distress  . Simvastatin     REACTION: rash  . Tetracycline     REACTION: rash    Social History: lives with Daughter  reports that she has never smoked. She does not have any smokeless tobacco history on file. She reports that she does not drink alcohol or use illicit drugs.  Family History: HTN- in all 5 siblings DM2- in 4 siblings    Review of Systems:  The patient denies anorexia, fever, weight loss, vision loss, decreased hearing, hoarseness, chest pain, syncope, dyspnea on exertion, peripheral edema, balance deficits, hemoptysis, severe indigestion/heartburn, hematuria, incontinence, genital sores, muscle weakness, suspicious skin lesions, transient blindness, difficulty walking,  depression, unusual weight change, abnormal bleeding, enlarged lymph nodes, angioedema, and breast masses.   Physical Exam:  GEN:  Pleasant Obese 75 year old African American Female examined  and in no acute distress; cooperative with exam Filed Vitals:   11/14/11 1840 11/14/11 1840 11/14/11 2029 11/15/11 0043  BP: 116/57 123/59 121/57 121/45  Pulse: 82 89 82  78  Temp:   98 F (36.7 C) 98.4 F (36.9 C)  TempSrc:   Oral Oral  Resp:   18   SpO2:   98% 98%   Blood pressure 121/45, pulse 78, temperature 98.4 F (36.9 C), temperature source Oral, resp. rate 18, SpO2 98.00%. PSYCH: SHe is alert and oriented x4; does not appear anxious does not appear depressed; affect is normal HEENT: Normocephalic and Atraumatic, Mucous membranes pink; PERRLA; EOM intact; Fundi:  Benign;  No scleral icterus, Nares: Patent, Oropharynx: Clear, Fair Dentition, Neck:  FROM, no cervical lymphadenopathy nor thyromegaly or carotid bruit; no JVD; Breasts:: Not examined CHEST WALL: No  tenderness CHEST: Normal respiration, clear to auscultation bilaterally HEART: Regular rate and rhythm; no murmurs rubs or gallops BACK: No kyphosis or scoliosis; no CVA tenderness ABDOMEN: Positive Bowel Sounds, Obese, soft non-tender; no masses, no organomegaly. Rectal Exam: Not done EXTREMITIES: No bone or joint deformity; age-appropriate arthropathy of the hands and knees; no cyanosis, clubbing or edema; no ulcerations. Genitalia: not examined PULSES: 2+ and symmetric SKIN: Normal hydration no rash or ulceration CNS: Cranial nerves 2-12 grossly intact no focal neurologic deficit   Labs & Imaging Results for orders placed during the hospital encounter of 11/14/11 (from the past 48 hour(s))  GLUCOSE, CAPILLARY     Status: Normal   Collection Time   11/14/11  6:12 PM      Component Value Range Comment   Glucose-Capillary 81  70 - 99 (mg/dL)   CBC     Status: Abnormal   Collection Time   11/14/11  6:18 PM      Component Value Range Comment   WBC 9.2  4.0 - 10.5 (K/uL)    RBC 3.56 (*) 3.87 - 5.11 (MIL/uL)    Hemoglobin 10.6 (*) 12.0 - 15.0 (g/dL)    HCT 31.8 (*) 36.0 - 46.0 (%)    MCV 89.3  78.0 - 100.0 (fL)    MCH 29.8  26.0 - 34.0 (pg)    MCHC 33.3  30.0 - 36.0 (g/dL)    RDW 12.4  11.5 - 15.5 (%)    Platelets 244  150 - 400 (K/uL)   DIFFERENTIAL     Status: Normal   Collection Time   11/14/11  6:18 PM      Component Value Range Comment   Neutrophils Relative 71  43 - 77 (%)    Neutro Abs 6.5  1.7 - 7.7 (K/uL)    Lymphocytes Relative 20  12 - 46 (%)    Lymphs Abs 1.9  0.7 - 4.0 (K/uL)    Monocytes Relative 7  3 - 12 (%)    Monocytes Absolute 0.7  0.1 - 1.0 (K/uL)    Eosinophils Relative 1  0 - 5 (%)    Eosinophils Absolute 0.1  0.0 - 0.7 (K/uL)    Basophils Relative 0  0 - 1 (%)    Basophils Absolute 0.0  0.0 - 0.1 (K/uL)   OCCULT BLOOD, POC DEVICE     Status: Normal   Collection Time   11/14/11  6:23 PM      Component Value Range Comment   Fecal Occult Bld NEGATIVE      POCT I-STAT, CHEM 8     Status: Abnormal   Collection Time   11/14/11  6:30 PM      Component Value Range Comment   Sodium 143  135 - 145 (mEq/L)    Potassium 3.7  3.5 - 5.1 (mEq/L)  Chloride 106  96 - 112 (mEq/L)    BUN 26 (*) 6 - 23 (mg/dL)    Creatinine, Ser 1.80 (*) 0.50 - 1.10 (mg/dL)    Glucose, Bld 78  70 - 99 (mg/dL)    Calcium, Ion 1.24  1.12 - 1.32 (mmol/L)    TCO2 27  0 - 100 (mmol/L)    Hemoglobin 11.6 (*) 12.0 - 15.0 (g/dL)    HCT 34.0 (*) 36.0 - 46.0 (%)   POCT I-STAT TROPONIN I     Status: Normal   Collection Time   11/14/11  6:35 PM      Component Value Range Comment   Troponin i, poc 0.00  0.00 - 0.08 (ng/mL)    Comment 3            GLUCOSE, CAPILLARY     Status: Abnormal   Collection Time   11/14/11  8:47 PM      Component Value Range Comment   Glucose-Capillary 65 (*) 70 - 99 (mg/dL)   URINALYSIS, ROUTINE W REFLEX MICROSCOPIC     Status: Abnormal   Collection Time   11/14/11 11:32 PM      Component Value Range Comment   Color, Urine YELLOW  YELLOW     APPearance CLEAR  CLEAR     Specific Gravity, Urine 1.004 (*) 1.005 - 1.030     pH 6.0  5.0 - 8.0     Glucose, UA NEGATIVE  NEGATIVE (mg/dL)    Hgb urine dipstick NEGATIVE  NEGATIVE     Bilirubin Urine NEGATIVE  NEGATIVE     Ketones, ur NEGATIVE  NEGATIVE (mg/dL)    Protein, ur NEGATIVE  NEGATIVE (mg/dL)    Urobilinogen, UA 0.2  0.0 - 1.0 (mg/dL)    Nitrite NEGATIVE  NEGATIVE     Leukocytes, UA TRACE (*) NEGATIVE    URINE MICROSCOPIC-ADD ON     Status: Normal   Collection Time   11/14/11 11:32 PM      Component Value Range Comment   WBC, UA 0-2  <3 (WBC/hpf)   GLUCOSE, CAPILLARY     Status: Normal   Collection Time   11/15/11 12:38 AM      Component Value Range Comment   Glucose-Capillary 93  70 - 99 (mg/dL)    Comment 1 Notify RN      Comment 2 Documented in Chart      Ct Abdomen Pelvis Wo Contrast  11/14/2011  *RADIOLOGY REPORT*  Clinical Data: Abdominal pain, history of diverticulitis   CT ABDOMEN AND PELVIS WITHOUT CONTRAST  Technique:  Multidetector CT imaging of the abdomen and pelvis was performed following the standard protocol without intravenous contrast.  Comparison: [facility] CT abdomen pelvis dated 12/20/2008  Findings: Mild subpleural reticulation in the lung bases.  Unenhanced liver, spleen, pancreas, and adrenal glands are within normal limits.  Gallbladder is grossly unremarkable.  No intrahepatic or extrahepatic ductal dilatation.  Kidneys are within normal limits.  No renal calculi or hydronephrosis.  No evidence of bowel obstruction.  Normal appendix.  Extensive colonic diverticulosis with mild inflammatory stranding involving a loop of sigmoid colon, compatible with sigmoid diverticulitis (series 2/image 65).  No drainable fluid collection or abscess.  No free air.  No evidence of abdominal aortic aneurysm.  No abdominopelvic ascites.  No suspicious abdominopelvic lymphadenopathy.  Uterus and bilateral ovaries are unremarkable.  Bladder is within normal limits.  Degenerative changes of the visualized thoracolumbar spine.  IMPRESSION: Mild sigmoid diverticulitis.  No drainable fluid  collection or abscess.  No free air.  Original Report Authenticated By: Julian Hy, M.D.   Ct Head Wo Contrast  11/14/2011  *RADIOLOGY REPORT*  Clinical Data: Headache, near syncope  CT HEAD WITHOUT CONTRAST  Technique:  Contiguous axial images were obtained from the base of the skull through the vertex without contrast.  Comparison: [facility] CT head dated 09/24/2008  Findings: No evidence of parenchymal hemorrhage or extra-axial fluid collection. No mass lesion, mass effect, or midline shift.  No CT evidence of acute infarction.  Mild subcortical white matter and periventricular small vessel ischemic changes.  Cerebral volume is age appropriate.  No ventriculomegaly.  The visualized paranasal sinuses are essentially clear. The mastoid air cells are unopacified.  No evidence  of calvarial fracture.  IMPRESSION: No evidence of acute intracranial abnormality.  Mild small vessel ischemic changes.  Original Report Authenticated By: Julian Hy, M.D.      Assessment/Plan: 1.  Syncope- due to ?Vasovagal event, orthostatic and neuro checks ordered, IVFs for rehydration.   2.  Acute Diverticulitis- IV Cipro and flagyl ordered, and Pain Control PRN.  3.  ABD Pain- due to #2., plan the same as #2.   4.  MIld Anemia-  monitor 5.  HTN-  Monitor.   6.  DM 2-  Continue Reg Meds, and SSI coverage ordered 7.  Obesity- stable. 8.  Other plans as per orders.    CODE STATUS:      FULL CODE         Ezmae Speers C 11/15/2011, 1:22 AM

## 2011-11-15 NOTE — ED Provider Notes (Signed)
Medical screening examination/treatment/procedure(s) were conducted as a shared visit with non-physician practitioner(s) and myself.  I personally evaluated the patient during the encounter   Kathalene Frames, MD 11/15/11 [phone]

## 2011-11-15 NOTE — Discharge Summary (Signed)
Physician Discharge Summary  Patient ID: Diana Harper MRN: [id number] DOB/AGE: 03-03-1936 74 y.o.  Admit date: 11/14/2011 Discharge date: 11/15/2011  Admission Diagnoses:  Discharge Diagnoses:  Active Problems:  Syncope  Diverticulitis   Discharged Condition: good  Hospital Course: Pt is a 75 y/o AAF that was admitted secondary to syncope and diverticulitis.  Head CT was negative and impression states No evidence of acute intracranial abnormality.  Pt was also placed on Tele and was sinus rhythm.  Her ecg also showed a sinus rhythm.  Pt was placed on IV abx's and mentions that she has been eating well.  While hospitalized had no other events of syncope.  Consults: none   Significant Diagnostic Studies: Pt had ct of abdomen, ct of head, ECG.  Reports are in chart please review for details.  Treatments: IV abx's and opiods for discomfort please review chart for details.   Discharge Exam: Blood pressure 121/52, pulse 87, temperature 98.1 F (36.7 C), temperature source Oral, resp. rate 18, height 5\' 6"  (1.676 m), weight 74.571 kg (164 lb 6.4 oz), SpO2 94.00%. General appearance: alert and cooperative Head: Normocephalic, without obvious abnormality, atraumatic Eyes: conjunctivae/corneas clear. PERRL, EOM's intact. Fundi benign. Throat: lips, mucosa, and tongue normal; teeth and gums normal Neck: no adenopathy, no carotid bruit, no JVD, supple, symmetrical, trachea midline and thyroid not enlarged, symmetric, no tenderness/mass/nodules Back: symmetric, no curvature. ROM normal. No CVA tenderness. Resp: clear to auscultation bilaterally Chest wall: no tenderness Cardio: regular rate and rhythm, S1, S2 normal, no murmur, click, rub or gallop GI: abnormal findings: abdominal tenderness at RLQ no rebound tenderness or guarding. + BS Extremities: extremities normal, atraumatic, no cyanosis or edema Skin: Skin color, texture, turgor normal. No rashes or lesions  Disposition: Final  discharge disposition not confirmed  Discharge Orders    Future Orders Please Complete By Expires   Diet - low sodium heart healthy      Increase activity slowly      Driving Restrictions      Comments:   May not drive on opiod medication   Call MD for:  temperature >100.4      Call MD for:  persistant nausea and vomiting      Call MD for:  severe uncontrolled pain      Discharge instructions      Comments:   Pt is to take medication as directed and complete full course.  Is to f/u with primary care physician in 1 wk.  If taking Vicodin patient is not to take tramadol.     Medication List  As of 11/15/2011  3:11 PM   START taking these medications         ciprofloxacin 500 MG tablet   Commonly known as: CIPRO   Take 1 tablet (500 mg total) by mouth 2 (two) times daily.      HYDROcodone-acetaminophen 5-500 MG per tablet   Commonly known as: VICODIN   Take 1 tablet by mouth every 6 (six) hours as needed for pain.      metroNIDAZOLE 500 MG tablet   Commonly known as: FLAGYL   Take 1 tablet (500 mg total) by mouth 3 (three) times daily.         CONTINUE taking these medications         amLODipine 5 MG tablet   Commonly known as: NORVASC      BIOTIN PO      calcium carbonate 600 MG Tabs   Commonly known as: OS-CAL  glimepiride 4 MG tablet   Commonly known as: AMARYL      hydrochlorothiazide 12.5 MG capsule   Commonly known as: MICROZIDE      insulin aspart protamine-insulin aspart (70-30) 100 UNIT/ML injection   Commonly known as: NOVOLOG 70/30      insulin glargine 100 UNIT/ML injection   Commonly known as: LANTUS      losartan 100 MG tablet   Commonly known as: COZAAR      traMADol 50 MG tablet   Commonly known as: ULTRAM          Where to get your medications    These are the prescriptions that you need to pick up.   You may get these medications from any pharmacy.         ciprofloxacin 500 MG tablet   HYDROcodone-acetaminophen 5-500 MG per  tablet   metroNIDAZOLE 500 MG tablet             Signed: Velvet Bathe 11/15/2011, 3:11 PM

## 2011-11-16 LAB — URINE CULTURE: Culture: NO GROWTH

## 2012-04-10 ENCOUNTER — Emergency Department (HOSPITAL_COMMUNITY): Payer: Medicare Other

## 2012-04-10 ENCOUNTER — Emergency Department (HOSPITAL_COMMUNITY)
Admission: EM | Admit: 2012-04-10 | Discharge: 2012-04-10 | Disposition: A | Discharge status: Home / Self Care | Payer: Medicare Other | Attending: Emergency Medicine | Admitting: Emergency Medicine

## 2012-04-10 ENCOUNTER — Encounter (HOSPITAL_COMMUNITY): Payer: Self-pay | Admitting: *Deleted

## 2012-04-10 DIAGNOSIS — E119 Type 2 diabetes mellitus without complications: Secondary | ICD-10-CM | POA: Insufficient documentation

## 2012-04-10 DIAGNOSIS — E860 Dehydration: Secondary | ICD-10-CM | POA: Insufficient documentation

## 2012-04-10 DIAGNOSIS — G9389 Other specified disorders of brain: Secondary | ICD-10-CM | POA: Insufficient documentation

## 2012-04-10 DIAGNOSIS — N289 Disorder of kidney and ureter, unspecified: Secondary | ICD-10-CM | POA: Insufficient documentation

## 2012-04-10 DIAGNOSIS — Z794 Long term (current) use of insulin: Secondary | ICD-10-CM | POA: Insufficient documentation

## 2012-04-10 DIAGNOSIS — R55 Syncope and collapse: Secondary | ICD-10-CM | POA: Insufficient documentation

## 2012-04-10 DIAGNOSIS — J45909 Unspecified asthma, uncomplicated: Secondary | ICD-10-CM | POA: Insufficient documentation

## 2012-04-10 DIAGNOSIS — Z79899 Other long term (current) drug therapy: Secondary | ICD-10-CM | POA: Insufficient documentation

## 2012-04-10 DIAGNOSIS — I10 Essential (primary) hypertension: Secondary | ICD-10-CM | POA: Insufficient documentation

## 2012-04-10 LAB — DIFFERENTIAL
Basophils Absolute: 0 10*3/uL (ref 0.0–0.1)
Eosinophils Relative: 0 % (ref 0–5)
Lymphocytes Relative: 42 % (ref 12–46)
Lymphs Abs: 5.3 10*3/uL — ABNORMAL HIGH (ref 0.7–4.0)
Monocytes Absolute: 0.7 10*3/uL (ref 0.1–1.0)
Monocytes Relative: 6 % (ref 3–12)
Neutro Abs: 6.5 10*3/uL (ref 1.7–7.7)

## 2012-04-10 LAB — CBC
HCT: 33.5 % — ABNORMAL LOW (ref 36.0–46.0)
Hemoglobin: 11 g/dL — ABNORMAL LOW (ref 12.0–15.0)
MCV: 90.5 fL (ref 78.0–100.0)
RBC: 3.7 MIL/uL — ABNORMAL LOW (ref 3.87–5.11)
RDW: 13 % (ref 11.5–15.5)
WBC: 12.6 10*3/uL — ABNORMAL HIGH (ref 4.0–10.5)

## 2012-04-10 LAB — COMPREHENSIVE METABOLIC PANEL
ALT: 11 U/L (ref 0–35)
AST: 10 U/L (ref 0–37)
Albumin: 3.2 g/dL — ABNORMAL LOW (ref 3.5–5.2)
Alkaline Phosphatase: 27 U/L — ABNORMAL LOW (ref 39–117)
BUN: 35 mg/dL — ABNORMAL HIGH (ref 6–23)
Chloride: 103 mEq/L (ref 96–112)
Potassium: 3.6 mEq/L (ref 3.5–5.1)
Sodium: 139 mEq/L (ref 135–145)
Total Bilirubin: 0.3 mg/dL (ref 0.3–1.2)
Total Protein: 5.9 g/dL — ABNORMAL LOW (ref 6.0–8.3)

## 2012-04-10 LAB — POCT I-STAT TROPONIN I: Troponin i, poc: 0.01 ng/mL (ref 0.00–0.08)

## 2012-04-10 MED ORDER — SODIUM CHLORIDE 0.9 % IV BOLUS (SEPSIS)
1000.0000 mL | Freq: Once | INTRAVENOUS | Status: AC
  Administered 2012-04-10: 1000 mL via INTRAVENOUS

## 2012-04-10 MED ORDER — SODIUM CHLORIDE 0.9 % IV SOLN: Freq: Once | INTRAVENOUS | Status: DC

## 2012-04-10 NOTE — ED Notes (Signed)
Patient aware of need for urine specimen. Patient unable to void at this time. Patient given urinal. Encouraged to call for assistance if needed.   

## 2012-04-10 NOTE — ED Provider Notes (Signed)
History     CSN: [id number]  Arrival date & time 04/10/12  1124   First MD Initiated Contact with Patient 04/10/12 1134      Chief Complaint  Patient presents with  . Hypotension    (Consider location/radiation/quality/duration/timing/severity/associated sxs/prior treatment) The history is provided by the patient and a relative.   Pt had a syncopal event today at home 1-2 hours after taken vicodin on an empty stomach--no preceeding sob, chest pain, abd pain, or headache. No recent vomiting, fever, diarrhea--cbg was > 200--relative found pt slumped over in her chair with questionable seizure activity without post-ictal period--no prior h/o same Past Medical History  Diagnosis Date  . Back pain   . Hypertension   . Asthma   . Diabetes mellitus     Past Surgical History  Procedure Date  . Abdominal hysterectomy     No family history on file.  History  Substance Use Topics  . Smoking status: Never Smoker   . Smokeless tobacco: Not on file  . Alcohol Use: No    OB History    Grav Para Term Preterm Abortions TAB SAB Ect Mult Living                  Review of Systems  All other systems reviewed and are negative.    Allergies  Latex; Simvastatin; and Tetracycline  Home Medications   Current Outpatient Rx  Name Route Sig Dispense Refill  . AMLODIPINE BESYLATE 5 MG PO TABS Oral Take 5 mg by mouth daily.      [name] BIOTIN PO Oral Take 1 tablet by mouth daily.      [name] CALCIUM CARBONATE 600 MG PO TABS Oral Take 600 mg by mouth daily.      [name] GLIMEPIRIDE 4 MG PO TABS Oral Take 4 mg by mouth daily before breakfast.      . HYDROCHLOROTHIAZIDE 12.5 MG PO CAPS Oral Take 12.5 mg by mouth daily.      . INSULIN ASPART PROT & ASPART (70-30) 100 UNIT/ML [place] SUSP Subcutaneous Inject 10 Units into the skin 2 (two) times daily with a meal.      . INSULIN GLARGINE 100 UNIT/ML [place] SOLN Subcutaneous Inject 60 Units into the skin at bedtime.      [name] LOSARTAN POTASSIUM 100 MG PO TABS Oral  Take 100 mg by mouth daily.        BP 98/48  Pulse 77  Resp 20  SpO2 99%  Physical Exam  Nursing note and vitals reviewed. Constitutional: She is oriented to person, place, and time. She appears well-developed and well-nourished.  Non-toxic appearance. No distress.  HENT:  Head: Normocephalic and atraumatic.  Eyes: Conjunctivae, EOM and lids are normal. Pupils are equal, round, and reactive to light.  Neck: Normal range of motion. Neck supple. No tracheal deviation present. No mass present.  Cardiovascular: Normal rate, regular rhythm and normal heart sounds.  Exam reveals no gallop.   No murmur heard. Pulmonary/Chest: Effort normal and breath sounds normal. No stridor. No respiratory distress. She has no decreased breath sounds. She has no wheezes. She has no rhonchi. She has no rales.  Abdominal: Soft. Normal appearance and bowel sounds are normal. She exhibits no distension. There is no tenderness. There is no rebound and no CVA tenderness.  Musculoskeletal: Normal range of motion. She exhibits no edema and no tenderness.  Neurological: She is alert and oriented to person, place, and time. She has normal strength. No cranial nerve deficit or  sensory deficit. GCS eye subscore is 4. GCS verbal subscore is 5. GCS motor subscore is 6.  Skin: Skin is warm and dry. No abrasion and no rash noted.  Psychiatric: She has a normal mood and affect. Her speech is normal and behavior is normal.    ED Course  Procedures (including critical care time)   Labs Reviewed  CBC  DIFFERENTIAL  COMPREHENSIVE METABOLIC PANEL  URINALYSIS, ROUTINE W REFLEX MICROSCOPIC  URINE CULTURE   No results found.   No diagnosis found.    MDM   Date: 04/10/2012  Rate: 65  Rhythm: normal sinus rhythm  QRS Axis: normal  Intervals: normal  ST/T Wave abnormalities: normal  Conduction Disutrbances:none  Narrative Interpretation:   Old EKG Reviewed: unchanged   2:15 PM Patient given IV fluids here.  Renal insufficiency likely due 2 dehydration. Repeat orthostatics were negative. She felt much better at this time. She is stable for discharge       Leota Jacobsen, MD 04/10/12 1416

## 2012-04-10 NOTE — ED Notes (Signed)
Asked pt if she can provide urine spec, pt states she cannot go at this time

## 2012-04-10 NOTE — ED Notes (Signed)
Pt reports taking vicodin this am prior to syncopal episode.  Pt reports she does not remember what happened.  Takes vicodin for her chronic back pain.  Pt's granddaughter reports pt was "slumped over on the kitchen table and was not responding."  She reports that pt was shaking "like she's nervous".  She reports that pt was confused when she came to.

## 2012-04-10 NOTE — ED Notes (Signed)
Pt's daughter at bedside to take pt home. Pt denied feeling lightheaded or dizzy when she stood up.

## 2012-04-10 NOTE — ED Notes (Signed)
BO:[id number] Expected date:04/10/12<BR> Expected time:<BR> Means of arrival:<BR> Comments:<BR> EMS 31 Gc - weakness/orthostatic

## 2012-04-10 NOTE — ED Notes (Signed)
Family at bedside. 

## 2012-04-10 NOTE — ED Notes (Signed)
Patient transported to CT 

## 2012-04-10 NOTE — Discharge Instructions (Signed)
Follow up with your doctor next week for repeat kidney function tests Dehydration, Adult Dehydration is when you lose more fluids from the body than you take in. Vital organs like the kidneys, brain, and heart cannot function without a proper amount of fluids and salt. Any loss of fluids from the body can cause dehydration.  CAUSES   Vomiting.   Diarrhea.   Excessive sweating.   Excessive urine output.   Fever.  SYMPTOMS  Mild dehydration  Thirst.   Dry lips.   Slightly dry mouth.  Moderate dehydration  Very dry mouth.   Sunken eyes.   Skin does not bounce back quickly when lightly pinched and released.   Dark urine and decreased urine production.   Decreased tear production.   Headache.  Severe dehydration  Very dry mouth.   Extreme thirst.   Rapid, weak pulse (more than 100 beats per minute at rest).   Cold hands and feet.   Not able to sweat in spite of heat and temperature.   Rapid breathing.   Blue lips.   Confusion and lethargy.   Difficulty being awakened.   Minimal urine production.   No tears.  DIAGNOSIS  Your caregiver will diagnose dehydration based on your symptoms and your exam. Blood and urine tests will help confirm the diagnosis. The diagnostic evaluation should also identify the cause of dehydration. TREATMENT  Treatment of mild or moderate dehydration can often be done at home by increasing the amount of fluids that you drink. It is best to drink small amounts of fluid more often. Drinking too much at one time can make vomiting worse. Refer to the home care instructions below. Severe dehydration needs to be treated at the hospital where you will probably be given intravenous (IV) fluids that contain water and electrolytes. HOME CARE INSTRUCTIONS   Ask your caregiver about specific rehydration instructions.   Drink enough fluids to keep your urine clear or pale yellow.   Drink small amounts frequently if you have nausea and  vomiting.   Eat as you normally do.   Avoid:   Foods or drinks high in sugar.   Carbonated drinks.   Juice.   Extremely hot or cold fluids.   Drinks with caffeine.   Fatty, greasy foods.   Alcohol.   Tobacco.   Overeating.   Gelatin desserts.   Wash your hands well to avoid spreading bacteria and viruses.   Only take over-the-counter or prescription medicines for pain, discomfort, or fever as directed by your caregiver.   Ask your caregiver if you should continue all prescribed and over-the-counter medicines.   Keep all follow-up appointments with your caregiver.  SEEK MEDICAL CARE IF:  You have abdominal pain and it increases or stays in one area (localizes).   You have a rash, stiff neck, or severe headache.   You are irritable, sleepy, or difficult to awaken.   You are weak, dizzy, or extremely thirsty.  SEEK IMMEDIATE MEDICAL CARE IF:   You are unable to keep fluids down or you get worse despite treatment.   You have frequent episodes of vomiting or diarrhea.   You have blood or green matter (bile) in your vomit.   You have blood in your stool or your stool looks black and tarry.   You have not urinated in 6 to 8 hours, or you have only urinated a small amount of very dark urine.   You have a fever.   You faint.  MAKE SURE YOU:  Understand these instructions.   Will watch your condition.   Will get help right away if you are not doing well or get worse.  Document Released: 11/15/2005 Document Revised: 11/04/2011 Document Reviewed: 07/05/2011 [facility] Patient Information 2012 [place].

## 2012-04-10 NOTE — ED Notes (Signed)
Per EMS, pt from home, pt's PMD reports weakness and  Near syncopal episode.  Pt is A&Ox 4.  Pt denies any pain at this time.

## 2012-04-15 ENCOUNTER — Encounter (HOSPITAL_COMMUNITY): Payer: Self-pay | Admitting: *Deleted

## 2012-04-15 ENCOUNTER — Emergency Department (INDEPENDENT_AMBULATORY_CARE_PROVIDER_SITE_OTHER): Payer: Medicare Other

## 2012-04-15 ENCOUNTER — Emergency Department (HOSPITAL_COMMUNITY): Payer: Medicare Other

## 2012-04-15 ENCOUNTER — Emergency Department (INDEPENDENT_AMBULATORY_CARE_PROVIDER_SITE_OTHER)
Admission: EM | Admit: 2012-04-15 | Discharge: 2012-04-15 | Disposition: A | Discharge status: Nursing Home (Includes ICF) | Payer: Medicare Other | Source: Home / Self Care | Attending: Emergency Medicine | Admitting: Emergency Medicine

## 2012-04-15 ENCOUNTER — Emergency Department (HOSPITAL_COMMUNITY)
Admission: EM | Admit: 2012-04-15 | Discharge: 2012-04-15 | Disposition: A | Discharge status: Home / Self Care | Payer: Medicare Other | Attending: Emergency Medicine | Admitting: Emergency Medicine

## 2012-04-15 ENCOUNTER — Encounter (HOSPITAL_COMMUNITY): Payer: Self-pay | Admitting: Emergency Medicine

## 2012-04-15 DIAGNOSIS — K5732 Diverticulitis of large intestine without perforation or abscess without bleeding: Secondary | ICD-10-CM

## 2012-04-15 DIAGNOSIS — E7800 Pure hypercholesterolemia, unspecified: Secondary | ICD-10-CM | POA: Insufficient documentation

## 2012-04-15 DIAGNOSIS — N179 Acute kidney failure, unspecified: Secondary | ICD-10-CM

## 2012-04-15 DIAGNOSIS — R5383 Other fatigue: Secondary | ICD-10-CM | POA: Insufficient documentation

## 2012-04-15 DIAGNOSIS — R142 Eructation: Secondary | ICD-10-CM | POA: Insufficient documentation

## 2012-04-15 DIAGNOSIS — K5792 Diverticulitis of intestine, part unspecified, without perforation or abscess without bleeding: Secondary | ICD-10-CM

## 2012-04-15 DIAGNOSIS — J45909 Unspecified asthma, uncomplicated: Secondary | ICD-10-CM | POA: Insufficient documentation

## 2012-04-15 DIAGNOSIS — Z794 Long term (current) use of insulin: Secondary | ICD-10-CM | POA: Insufficient documentation

## 2012-04-15 DIAGNOSIS — E119 Type 2 diabetes mellitus without complications: Secondary | ICD-10-CM | POA: Insufficient documentation

## 2012-04-15 DIAGNOSIS — I10 Essential (primary) hypertension: Secondary | ICD-10-CM | POA: Insufficient documentation

## 2012-04-15 DIAGNOSIS — R1032 Left lower quadrant pain: Secondary | ICD-10-CM | POA: Insufficient documentation

## 2012-04-15 DIAGNOSIS — R110 Nausea: Secondary | ICD-10-CM | POA: Insufficient documentation

## 2012-04-15 DIAGNOSIS — E1165 Type 2 diabetes mellitus with hyperglycemia: Secondary | ICD-10-CM

## 2012-04-15 DIAGNOSIS — R5381 Other malaise: Secondary | ICD-10-CM | POA: Insufficient documentation

## 2012-04-15 DIAGNOSIS — R141 Gas pain: Secondary | ICD-10-CM | POA: Insufficient documentation

## 2012-04-15 DIAGNOSIS — R51 Headache: Secondary | ICD-10-CM | POA: Insufficient documentation

## 2012-04-15 DIAGNOSIS — Z79899 Other long term (current) drug therapy: Secondary | ICD-10-CM | POA: Insufficient documentation

## 2012-04-15 HISTORY — DX: Cardiac murmur, unspecified: R01.1

## 2012-04-15 HISTORY — DX: Anxiety disorder, unspecified: F41.9

## 2012-04-15 HISTORY — DX: Anemia, unspecified: D64.9

## 2012-04-15 LAB — DIFFERENTIAL
Basophils Absolute: 0 10*3/uL (ref 0.0–0.1)
Basophils Relative: 0 % (ref 0–1)
Eosinophils Relative: 1 % (ref 0–5)
Eosinophils Relative: 1 % (ref 0–5)
Lymphocytes Relative: 27 % (ref 12–46)
Lymphocytes Relative: 32 % (ref 12–46)
Monocytes Absolute: 0.6 10*3/uL (ref 0.1–1.0)
Monocytes Absolute: 0.7 10*3/uL (ref 0.1–1.0)
Monocytes Relative: 7 % (ref 3–12)
Neutro Abs: 5 10*3/uL (ref 1.7–7.7)

## 2012-04-15 LAB — POCT I-STAT, CHEM 8
BUN: 30 mg/dL — ABNORMAL HIGH (ref 6–23)
Calcium, Ion: 1.3 mmol/L (ref 1.12–1.32)
Chloride: 103 mEq/L (ref 96–112)
Glucose, Bld: 344 mg/dL — ABNORMAL HIGH (ref 70–99)
TCO2: 27 mmol/L (ref 0–100)

## 2012-04-15 LAB — POCT URINALYSIS DIP (DEVICE)
Bilirubin Urine: NEGATIVE
Ketones, ur: NEGATIVE mg/dL
Protein, ur: NEGATIVE mg/dL
Specific Gravity, Urine: 1.015 (ref 1.005–1.030)
pH: 6 (ref 5.0–8.0)

## 2012-04-15 LAB — CBC
HCT: 30.9 % — ABNORMAL LOW (ref 36.0–46.0)
HCT: 31 % — ABNORMAL LOW (ref 36.0–46.0)
Hemoglobin: 10.2 g/dL — ABNORMAL LOW (ref 12.0–15.0)
MCHC: 33 g/dL (ref 30.0–36.0)
MCHC: 33.2 g/dL (ref 30.0–36.0)
MCV: 90.6 fL (ref 78.0–100.0)
MCV: 90.1 fL (ref 78.0–100.0)
RDW: 13 % (ref 11.5–15.5)
WBC: 7.5 10*3/uL (ref 4.0–10.5)

## 2012-04-15 LAB — COMPREHENSIVE METABOLIC PANEL
ALT: 14 U/L (ref 0–35)
AST: 12 U/L (ref 0–37)
Albumin: 3.5 g/dL (ref 3.5–5.2)
Alkaline Phosphatase: 33 U/L — ABNORMAL LOW (ref 39–117)
Potassium: 4.3 mEq/L (ref 3.5–5.1)
Sodium: 137 mEq/L (ref 135–145)
Total Protein: 7 g/dL (ref 6.0–8.3)

## 2012-04-15 MED ORDER — IOHEXOL 300 MG/ML  SOLN
20.0000 mL | INTRAMUSCULAR | Status: AC
  Administered 2012-04-15: 20 mL via ORAL

## 2012-04-15 MED ORDER — CIPROFLOXACIN IN D5W 400 MG/200ML IV SOLN
400.0000 mg | Freq: Once | INTRAVENOUS | Status: AC
  Administered 2012-04-15: 400 mg via INTRAVENOUS
  Filled 2012-04-15: qty 200

## 2012-04-15 MED ORDER — METRONIDAZOLE IN NACL 5-0.79 MG/ML-% IV SOLN
500.0000 mg | Freq: Once | INTRAVENOUS | Status: AC
  Administered 2012-04-15: 500 mg via INTRAVENOUS
  Filled 2012-04-15: qty 100

## 2012-04-15 MED ORDER — ONDANSETRON HCL 4 MG PO TABS: 4.0000 mg | ORAL_TABLET | Freq: Four times a day (QID) | ORAL | Status: AC

## 2012-04-15 MED ORDER — METRONIDAZOLE 500 MG PO TABS: 500.0000 mg | ORAL_TABLET | Freq: Two times a day (BID) | ORAL | Status: AC

## 2012-04-15 MED ORDER — CIPROFLOXACIN HCL 250 MG PO TABS: 500.0000 mg | ORAL_TABLET | Freq: Two times a day (BID) | ORAL | Status: AC

## 2012-04-15 MED ORDER — OXYCODONE-ACETAMINOPHEN 5-325 MG PO TABS: 1.0000 | ORAL_TABLET | ORAL | Status: AC | PRN

## 2012-04-15 MED ORDER — SODIUM CHLORIDE 0.9 % IV BOLUS (SEPSIS)
1000.0000 mL | Freq: Once | INTRAVENOUS | Status: AC
  Administered 2012-04-15: 1000 mL via INTRAVENOUS

## 2012-04-15 MED ORDER — MORPHINE SULFATE 4 MG/ML IJ SOLN
4.0000 mg | Freq: Once | INTRAMUSCULAR | Status: AC
  Administered 2012-04-15: 4 mg via INTRAVENOUS
  Filled 2012-04-15: qty 1

## 2012-04-15 MED ORDER — ONDANSETRON HCL 4 MG/2ML IJ SOLN
4.0000 mg | Freq: Once | INTRAMUSCULAR | Status: AC
  Administered 2012-04-15: 4 mg via INTRAVENOUS
  Filled 2012-04-15: qty 2

## 2012-04-15 NOTE — ED Notes (Signed)
Pt reports abdominal pain with nausea and headache onset Sunday. Pt reports had a syncopal episode on Monday where her heart rate and BP dropped. Pt was seen at [facility].

## 2012-04-15 NOTE — ED Notes (Signed)
CBG 276 Rn notified angela

## 2012-04-15 NOTE — ED Notes (Signed)
Dr. Lita Mains updated re: abx complete and pt status.

## 2012-04-15 NOTE — ED Provider Notes (Signed)
Chief Complaint  Patient presents with  . Headache  . Abdominal Pain  . Shoulder Pain  . Nausea    History of Present Illness:   [name] is a 76 year old female with diabetes who has had a six-day history of left lower corner abdominal pain which radiates up in her chest and towards her left shoulder. She feels abdominal bloating. The pain also radiates to the right lower quadrant and is better after bowel movement or after passing gas. She has had a history of diverticulitis in the past and was hospitalized for this at least once. She feels achy all over, tired, fatigued, has no energy, and feels weak. She was seen Monday in the emergency department because of an episode of syncope with a systolic blood pressure in the 80s and a pulse in the 50s. This was thought to be due to dehydration. She was given some IV fluids and sent home. She states she was having the left lower quadrant pain at that time but wasn't very severe. She also has chronic back pain and had an MRI is on meds for that. She's been somewhat constipated and felt nauseated and not had much of an appetite. She denies any fever, chills, diaphoresis, vomiting, or blood in the stool. She has no urinary or GYN complaints.  Review of Systems:  Other than noted above, the patient denies any of the following symptoms: Constitutional:  No fever, chills, fatigue, weight loss or anorexia. Lungs:  No cough or shortness of breath. Heart:  No chest pain, palpitations, syncope or edema.  No cardiac history. Abdomen:  No nausea, vomiting, hematememesis, melena, diarrhea, or hematochezia. GU:  No dysuria, frequency, urgency, or hematuria. Gyn:  No vaginal discharge, itching, abnormal bleeding or pelvic pain. Skin:  No rash or itching.  [place]:  Past medical history, family history, social history, meds, and allergies were reviewed.  No prior abdominal surgeries, past history of GI problems, STDs or GYN problems.  No history of aspirin or NSAID  use.  No excessive alcohol intake.  Physical Exam:   Vital signs:  BP 121/45  Pulse 79  Temp(Src) 98.2 F (36.8 C) (Oral)  Resp 19  SpO2 99% Gen:  Alert, oriented, in no distress. Lungs:  Breath sounds clear and equal bilaterally.  No wheezes, rales or rhonchi. Heart:  Regular rhythm.  No gallops or murmers.   Abdomen:  Soft, flat, nondistended, and she has moderate tenderness to palpation in the left lower quadrant without guarding or rebound. No organomegaly or mass. Bowel sounds were not heard. Skin:  Clear, warm and dry.  No rash.  Labs:   Results for orders placed during the hospital encounter of 04/15/12  POCT URINALYSIS DIP (DEVICE)      Component Value Range   Glucose, UA 500 (*) NEGATIVE (mg/dL)   Bilirubin Urine NEGATIVE  NEGATIVE    Ketones, ur NEGATIVE  NEGATIVE (mg/dL)   Specific Gravity, Urine 1.015  1.005 - 1.030    Hgb urine dipstick NEGATIVE  NEGATIVE    pH 6.0  5.0 - 8.0    Protein, ur NEGATIVE  NEGATIVE (mg/dL)   Urobilinogen, UA 0.2  0.0 - 1.0 (mg/dL)   Nitrite NEGATIVE  NEGATIVE    Leukocytes, UA TRACE (*) NEGATIVE   CBC      Component Value Range   WBC 7.7  4.0 - 10.5 (K/uL)   RBC 3.41 (*) 3.87 - 5.11 (MIL/uL)   Hemoglobin 10.2 (*) 12.0 - 15.0 (g/dL)   HCT  30.9 (*) 36.0 - 46.0 (%)   MCV 90.6  78.0 - 100.0 (fL)   MCH 29.9  26.0 - 34.0 (pg)   MCHC 33.0  30.0 - 36.0 (g/dL)   RDW 13.2  11.5 - 15.5 (%)   Platelets 217  150 - 400 (K/uL)  DIFFERENTIAL      Component Value Range   Neutrophils Relative 65  43 - 77 (%)   Neutro Abs 5.0  1.7 - 7.7 (K/uL)   Lymphocytes Relative 27  12 - 46 (%)   Lymphs Abs 2.1  0.7 - 4.0 (K/uL)   Monocytes Relative 7  3 - 12 (%)   Monocytes Absolute 0.6  0.1 - 1.0 (K/uL)   Eosinophils Relative 1  0 - 5 (%)   Eosinophils Absolute 0.0  0.0 - 0.7 (K/uL)   Basophils Relative 0  0 - 1 (%)   Basophils Absolute 0.0  0.0 - 0.1 (K/uL)  POCT I-STAT, CHEM 8      Component Value Range   Sodium 137  135 - 145 (mEq/L)   Potassium  3.9  3.5 - 5.1 (mEq/L)   Chloride 103  96 - 112 (mEq/L)   BUN 30 (*) 6 - 23 (mg/dL)   Creatinine, Ser 1.90 (*) 0.50 - 1.10 (mg/dL)   Glucose, Bld 344 (*) 70 - 99 (mg/dL)   Calcium, Ion 1.30  1.12 - 1.32 (mmol/L)   TCO2 27  0 - 100 (mmol/L)   Hemoglobin 10.9 (*) 12.0 - 15.0 (g/dL)   HCT 32.0 (*) 36.0 - 46.0 (%)     Radiology:  Dg Abd Acute W/chest  04/15/2012  *RADIOLOGY REPORT*  Clinical Data: Left lower quadrant abdominal pain for the past week.  ACUTE ABDOMEN SERIES (ABDOMEN 2 VIEW & CHEST 1 VIEW)  Comparison: Chest x-ray 09/16/2010.  Findings: Lung volumes are normal.  No consolidative airspace disease.  No pleural effusions.  No pneumothorax.  No pulmonary nodule or mass noted.  Pulmonary vasculature and the cardiomediastinal silhouette are within normal limits.  No pneumoperitoneum.  Gas and stool is seen scattered throughout the colon extending to the level of the distal rectum.  No pathologic distension of small bowel.  S-shaped scoliosis of the thoracolumbar spine is incidentally noted, convex to the right in the lower thoracic region and convex to the left in the upper lumbar region.  IMPRESSION: 1.  Nonobstructive bowel gas pattern. 2.  No pneumoperitoneum. 3.  No radiographic evidence of acute cardiopulmonary disease. 4.  Thoracolumbar scoliosis, as above.  Original Report Authenticated By: Etheleen Mayhew, M.D.     Date: 04/15/2012  Rate: 78  Rhythm: normal sinus rhythm  QRS Axis: normal  Intervals: normal  ST/T Wave abnormalities: normal  Conduction Disutrbances:none  Narrative Interpretation: Normal sinus rhythm, normal EKG.  Old EKG Reviewed: none available  Assessment:  The primary encounter diagnosis was Diverticulitis. Diagnoses of Acute renal failure and Diabetes mellitus type 2, uncontrolled were also pertinent to this visit.  Plan:   1.  The following meds were prescribed:   New Prescriptions   No medications on file   2.  The patient will be transported to the  emergency department via shuttle.  Harden Mo, MD 04/15/12 [phone]

## 2012-04-15 NOTE — ED Notes (Signed)
Pt with c/o abdominal pain onset 5/12 increased gas - syncopal episode 13th seen ED head CT at that time - this am increased abdominal pain - gas increases when eating - c/o shoulder pain bilateral - and onset of headache this am - frontal headache - has not taken pain medication today - left lower abd tender to palpation - per pt last bm this am has had loose bm's x 3 - 4 days once a day

## 2012-04-15 NOTE — ED Notes (Signed)
Pt requesting something to drink and eat. Spoke with Dr. Lita Mains and pt can have something to drink only. Pt updated.

## 2012-04-15 NOTE — ED Provider Notes (Signed)
History     CSN: [id number]  Arrival date & time 04/15/12  1351   First MD Initiated Contact with Patient 04/15/12 1501      Chief Complaint  Patient presents with  . Abdominal Pain  . Headache  . Nausea    (Consider location/radiation/quality/duration/timing/severity/associated sxs/prior treatment) HPI Pt p/w with progressively worsening LLQ pain, bloating, nausea, generalized weakness x 6 days. No fever, chills, urinary complaints. Seen in ED on Monday after syncopal episode and had CT of head. Given IVF and d/c home. Seen earlier in the urgent care clinic. Referred to ED for further workup of suspected diverticulitis.  Past Medical History  Diagnosis Date  . Back pain   . Hypertension   . Asthma   . Diabetes mellitus   . Murmur   . Eczema   . Degenerative disc disease   . Vitamin d deficiency   . Anemia   . Anxiety   . Kidney disease   . High cholesterol   . Hyperkalemia     Past Surgical History  Procedure Date  . Abdominal hysterectomy     No family history on file.  History  Substance Use Topics  . Smoking status: Never Smoker   . Smokeless tobacco: Not on file  . Alcohol Use: No    OB History    Grav Para Term Preterm Abortions TAB SAB Ect Mult Living                  Review of Systems  Constitutional: Positive for fatigue. Negative for fever and chills.  Respiratory: Negative for shortness of breath.   Cardiovascular: Negative for chest pain.  Gastrointestinal: Positive for nausea and abdominal pain. Negative for vomiting, diarrhea, constipation and blood in stool.  Genitourinary: Negative for dysuria and flank pain.  Neurological: Negative for dizziness and numbness.    Allergies  Latex; Metformin and related; Simvastatin; Tetracycline; and Zithromax  Home Medications   Current Outpatient Rx  Name Route Sig Dispense Refill  . AMLODIPINE BESYLATE 5 MG PO TABS Oral Take 5 mg by mouth daily.      [name] CALCIUM CARBONATE 600 MG PO TABS Oral  Take 600 mg by mouth daily.      [name] VITAMIN D 1000 UNITS PO TABS Oral Take 1,000 Units by mouth daily.    [name] CITALOPRAM HYDROBROMIDE 40 MG PO TABS Oral Take 40 mg by mouth daily.    . FENOFIBRATE 160 MG PO TABS Oral Take 160 mg by mouth daily.    [name] GLIMEPIRIDE 4 MG PO TABS Oral Take 4 mg by mouth daily before breakfast.      . GLUCAGON EMERGENCY IJ Injection Inject 1 application as directed daily as needed. For low blood sugar    . HYDROCHLOROTHIAZIDE 12.5 MG PO CAPS Oral Take 12.5 mg by mouth daily.      [name] HYDROCODONE-ACETAMINOPHEN 5-325 MG PO TABS Oral Take 1 tablet by mouth every 6 (six) hours as needed.    . INSULIN ASPART PROT & ASPART (70-30) 100 UNIT/ML [place] SUSP Subcutaneous Inject 10 Units into the skin 2 (two) times daily with a meal.      . INSULIN GLARGINE 100 UNIT/ML [place] SOLN Subcutaneous Inject 60 Units into the skin at bedtime.      [name] LORATADINE 10 MG PO TABS Oral Take 10 mg by mouth daily as needed. For allergies    . LOSARTAN POTASSIUM 100 MG PO TABS Oral Take 100 mg by mouth daily.      [name]  PRO HERBS VISION HEALTH PO Oral Take 1 capsule by mouth daily.    [name] OVER THE COUNTER MEDICATION Oral Take 1 tablet by mouth daily.    [name] OVER THE COUNTER MEDICATION Oral Take 1 tablet by mouth daily. Multivitamin for hair, skin and nails    . PRAVASTATIN SODIUM 40 MG PO TABS Oral Take 40 mg by mouth daily.    [name] PRESCRIPTION MEDICATION  lipoderm patch    . PSYLLIUM 58.6 % PO PACK Oral Take 1 packet by mouth daily.    [name] TIZANIDINE HCL 2 MG PO CAPS Oral Take by mouth 3 (three) times daily.    . TRAMADOL HCL 50 MG PO TABS Oral Take 50 mg by mouth every 6 (six) hours as needed. For pain    . CIPROFLOXACIN HCL 250 MG PO TABS Oral Take 2 tablets (500 mg total) by mouth 2 (two) times daily. 20 tablet 0  . METRONIDAZOLE 500 MG PO TABS Oral Take 1 tablet (500 mg total) by mouth 2 (two) times daily. 20 tablet 0  . ONDANSETRON HCL 4 MG PO TABS Oral Take 1 tablet (4 mg total) by mouth every 6 (six) hours. 12  tablet 0  . OXYCODONE-ACETAMINOPHEN 5-325 MG PO TABS Oral Take 1 tablet by mouth every 4 (four) hours as needed for pain. 15 tablet 0    BP 111/62  Pulse 69  Temp(Src) 98.5 F (36.9 C) (Oral)  Resp 20  SpO2 96%  Physical Exam  Nursing note and vitals reviewed. Constitutional: She is oriented to person, place, and time. She appears well-developed and well-nourished. No distress.  HENT:  Head: Normocephalic and atraumatic.  Mouth/Throat: Oropharynx is clear and moist.  Eyes: EOM are normal. Pupils are equal, round, and reactive to light.  Neck: Normal range of motion. Neck supple.  Cardiovascular: Normal rate and regular rhythm.   Pulmonary/Chest: Effort normal and breath sounds normal. No respiratory distress. She has no wheezes. She has no rales.  Abdominal: Soft. Bowel sounds are normal. She exhibits no mass. There is tenderness (LLQ TTP w/o R/G). There is no rebound and no guarding.  Musculoskeletal: Normal range of motion. She exhibits no edema and no tenderness.  Neurological: She is alert and oriented to person, place, and time.       No focal motor deficits  Skin: Skin is warm and dry. No rash noted. No erythema.  Psychiatric: She has a normal mood and affect. Her behavior is normal.    ED Course  Procedures (including critical care time)  Labs Reviewed  COMPREHENSIVE METABOLIC PANEL - Abnormal; Notable for the following:    Glucose, Bld 287 (*)    BUN 28 (*)    Creatinine, Ser 1.65 (*)    Alkaline Phosphatase 33 (*)    GFR calc non Af Amer 30 (*)    GFR calc Af Amer 34 (*)    All other components within normal limits  CBC - Abnormal; Notable for the following:    RBC 3.44 (*)    Hemoglobin 10.3 (*)    HCT 31.0 (*)    All other components within normal limits  GLUCOSE, CAPILLARY - Abnormal; Notable for the following:    Glucose-Capillary 276 (*)    All other components within normal limits  DIFFERENTIAL   Ct Abdomen Pelvis Wo Contrast  04/15/2012  *RADIOLOGY  REPORT*  Clinical Data: Left lower quadrant pain  CT ABDOMEN AND PELVIS WITHOUT CONTRAST  Technique:  Multidetector CT imaging of the abdomen and  pelvis was performed following the standard protocol without intravenous contrast.  Comparison: CT 11/14/2011  Findings: Sigmoid diverticulitis.  This is the same segment involved on the prior study.  There is thickening of the sigmoid colon with associated diverticula.  Scattered diverticula also present throughout the remainder of  the colon.  There is mild constipation.  No abscess or free fluid is present. No free air. The appendix is normal.  Lung bases are clear.  The liver and gallbladder are normal. Pancreas and spleen are normal.  Kidneys are normal without kidney stone or obstruction.  Lumbar scoliosis and spondylosis.  IMPRESSION: Sigmoid diverticulitis without abscess or free fluid.  Original Report Authenticated By: Truett Perna, M.D.   Dg Abd Acute W/chest  04/15/2012  *RADIOLOGY REPORT*  Clinical Data: Left lower quadrant abdominal pain for the past week.  ACUTE ABDOMEN SERIES (ABDOMEN 2 VIEW & CHEST 1 VIEW)  Comparison: Chest x-ray 09/16/2010.  Findings: Lung volumes are normal.  No consolidative airspace disease.  No pleural effusions.  No pneumothorax.  No pulmonary nodule or mass noted.  Pulmonary vasculature and the cardiomediastinal silhouette are within normal limits.  No pneumoperitoneum.  Gas and stool is seen scattered throughout the colon extending to the level of the distal rectum.  No pathologic distension of small bowel.  S-shaped scoliosis of the thoracolumbar spine is incidentally noted, convex to the right in the lower thoracic region and convex to the left in the upper lumbar region.  IMPRESSION: 1.  Nonobstructive bowel gas pattern. 2.  No pneumoperitoneum. 3.  No radiographic evidence of acute cardiopulmonary disease. 4.  Thoracolumbar scoliosis, as above.  Original Report Authenticated By: Etheleen Mayhew, M.D.     1.  Diverticulitis       MDM   Pt is well-appearing, VSS. Pain is improved. Tolerating PO's. Will d/c home with PO abx and GI f/u.        Julianne Rice, MD 04/15/12 2252

## 2012-04-15 NOTE — Discharge Instructions (Signed)
Low Fiber and Residue Restricted Diet A low fiber diet restricts foods that contain carbohydrates that are not digested in the small intestine. A diet containing about 10 g of fiber is considered low fiber. The diet needs to be individualized to suit patient tolerances and preferences and to avoid unnecessary restrictions. Generally, the foods emphasized in a low fiber diet have no skins or seeds. They may have been processed to remove bran, germ, or husks. Cooking may not necessarily eliminate the fiber. Cooking may, in fact, enable a greater quantity of fiber to be consumed in a lesser volume. Legumes and nuts are also restricted. The term low residue has also been used to describe low fiber diets, although the two are not the same. Residue refers to any substance that adds to bowel (colonic) contents, such as sloughed cells and intestinal bacteria, in addition to fiber. Residue-containing foods, prunes and prune juice, milk, and connective tissue from meats may also need to be eliminated. It is important to eliminate these foods during sudden (acute) attacks of inflammatory bowel disease, when there is a partial obstruction due to another reason, or when minimal fecal output is desired. When these problems are gone, a more normal diet may be used. PURPOSE  Prevent blockage of a partially obstructed or narrowed gastrointestinal tract.   Reduce stool weight and volume.   Slow the movement of waste.  WHEN IS THIS DIET USED?  Acute phase of Crohn's disease, ulcerative colitis, regional enteritis, or diverticulitis.   Narrowing (stenosis) of intestinal or esophageal tubes (lumina).   Transitional diet following surgery, injury (trauma), or illness.  ADEQUACY This diet is nutritionally adequate based on individual food choices according to the Recommended Dietary Allowances of the [employer]. CHOOSING FOODS Check labels, especially on foods from the starch list. Often, dietary fiber  content is listed with the Nutrition Facts panel.  Breads and Starches  Allowed: White, [place], and pita breads, plain rolls, buns, or sweet rolls, doughnuts, waffles, pancakes, bagels. Plain muffins, sweet breads, biscuits, matzoth. Flour. Soda, saltine, or graham crackers. Pretzels, rusks, melba toast, zwieback. Cooked cereals: cornmeal, farina, cream cereals. Dry cereals: refined corn, wheat, rice, and oat cereals (check label). Potatoes prepared any way without skins, refined macaroni, spaghetti, noodles, refined rice.   Avoid: Bread, rolls, or crackers made with whole-wheat, multigrains, rye, bran seeds, nuts, or coconut. Corn tortillas, table-shells. Corn chips, tortilla chips. Cereals containing whole-grains, multigrains, bran, coconut, nuts, or raisins. Cooked or dry oatmeal. Coarse wheat cereals, granola. Cereals advertised as "high fiber." Potato skins. Whole-grain pasta, wild or brown rice. Popcorn.  Vegetables  Allowed:  Strained tomato and vegetable juices. Fresh: tender lettuce, cucumber, cabbage, spinach, bean sprouts. Cooked, canned: asparagus, bean sprouts, cut green or wax beans, cauliflower, pumpkin, beets, mushrooms, olives, spinach, yellow squash, tomato, tomato sauce (no seeds), zucchini (peeled), turnips. Canned sweet potatoes. Small amounts of celery, onion, radish, and green pepper may be used. Keep servings limited to  cup.   Avoid: Fresh, cooked, or canned: artichokes, baked beans, beet greens, broccoli, Brussels sprouts, French-style green beans, corn, kale, legumes, peas, sweet potatoes. Cooked: green or red cabbage, spinach. Avoid large servings of any vegetables.  Fruit  Allowed:  All fruit juices except prune juice. Cooked or canned: apricots applesauce, cantaloupe, cherries, grapefruit, grapes, kiwi, mandarin oranges, peaches, pears, fruit cocktail, pineapple, plums, watermelon. Fresh: banana, grapes, cantaloupe, avocado, cherries, pineapple, grapefruit, kiwi,  nectarines, peaches, oranges, blueberries, plums. Keep servings limited to  cup or 1 piece.  Avoid: Fresh: apple with or without skin, apricots, mango, pears, raspberries, strawberries. Prune juice, stewed or dried prunes. Dried fruits, raisins, dates. Avoid large servings of all fresh fruits.  Meat and Meat Substitutes  Allowed:  Ground or well-cooked tender beef, ham, veal, lamb, pork, or poultry. Eggs, plain cheese. Fish, oysters, shrimp, lobster, other seafood. Liver, organ meats.   Avoid: Tough, fibrous meats with gristle. Peanut butter, smooth or chunky. Cheese with seeds, nuts, or other foods not allowed. Nuts, seeds, legumes, dried peas, beans, lentils.  Milk  Allowed:  All milk products except those not allowed. Milk and milk product consumption should be minimal when low residue is desired.   Avoid: Yogurt that contains nuts or seeds.  Soups and Combination Foods  Allowed:  Bouillon, broth, or cream soups made from allowed foods. Any strained soup. Casseroles or mixed dishes made with allowed foods.   Avoid: Soups made from vegetables that are not allowed or that contain other foods not allowed.  Desserts and Sweets  Allowed:  Plain cakes and cookies, pie made with allowed fruit, pudding, custard, cream pie. Gelatin, fruit, ice, sherbet, frozen ice pops. Ice cream, ice milk without nuts. Plain hard candy, honey, jelly, molasses, syrup, sugar, chocolate syrup, gumdrops, marshmallows.   Avoid: Desserts, cookies, or candies that contain nuts, peanut butter, or dried fruits. Jams, preserves with seeds, marmalade.  Fats and Oils  Allowed:  Margarine, butter, cream, mayonnaise, salad oils, plain salad dressings made from allowed foods. Plain gravy, crisp bacon without rind.   Avoid: Seeds, nuts, olives. Avocados.  Beverages  Allowed:  All, except those listed to avoid.   Avoid: Fruit juices with high pulp, prune juice.  Condiments  Allowed:  Ketchup, mustard, horseradish,  vinegar, cream sauce, cheese sauce, cocoa powder. Spices in moderation: allspice, basil, bay leaves, celery powder or leaves, cinnamon, cumin powder, curry powder, ginger, mace, marjoram, onion or garlic powder, oregano, paprika, parsley flakes, ground pepper, rosemary, sage, savory, tarragon, thyme, turmeric.   Avoid: Coconut, pickles.  SAMPLE MEAL PLAN The following menu is provided as a sample. Your daily menu plans will vary. Be sure to include a minimum of the following each day in order to provide essential nutrients for the adult:  Starch/Bread/Cereal Group, 6 servings.   Fruit/Vegetable Group, 5 servings.   Meat/Meat Substitute Group, 2 servings.   Milk/Milk Substitute Group, 2 servings.  A serving is equal to  cup for fruits, vegetables, and cooked cereals or 1 piece for foods such as a piece of bread, 1 orange, or 1 apple. For dry cereals and crackers, use serving sizes listed on the label. Combination foods may count as full or partial servings from various food groups. Fats, desserts, and sweets may be added to the meal plan after the requirements for essential nutrients are met. SAMPLE MENU Breakfast   cup orange juice.   1 boiled egg.   1 slice white toast.   Margarine.    cup cornflakes.   1 cup milk.   Beverage.  Lunch   cup chicken noodle soup.   2 to 3 oz sliced roast beef.   2 slices seedless rye bread.   Mayonnaise.    cup tomato juice.   1 small banana.   Beverage.  Dinner  3 oz baked chicken.    cup scalloped potatoes.    cup cooked beets.   White dinner roll.   Margarine.    cup canned peaches.   Beverage.  Document Released: 05/07/2002 Document Revised: 11/04/2011  Document Reviewed: 10/18/2011 [facility] Patient Information 2012 [place].  Diverticulitis A diverticulum is a small pouch or sac on the colon. Diverticulosis is the presence of these diverticula on the colon. Diverticulitis is the irritation  (inflammation) or infection of diverticula. CAUSES  The colon and its diverticula contain bacteria. If food particles block the tiny opening to a diverticulum, the bacteria inside can grow and cause an increase in pressure. This leads to infection and inflammation and is called diverticulitis. SYMPTOMS   Abdominal pain and tenderness. Usually, the pain is located on the left side of your abdomen. However, it could be located elsewhere.   Fever.   Bloating.   Feeling sick to your stomach (nausea).   Throwing up (vomiting).   Abnormal stools.  DIAGNOSIS  Your caregiver will take a history and perform a physical exam. Since many things can cause abdominal pain, other tests may be necessary. Tests may include:  Blood tests.   Urine tests.   X-ray of the abdomen.   CT scan of the abdomen.  Sometimes, surgery is needed to determine if diverticulitis or other conditions are causing your symptoms. TREATMENT  Most of the time, you can be treated without surgery. Treatment includes:  Resting the bowels by only having liquids for a few days. As you improve, you will need to eat a low-fiber diet.   Intravenous (IV) fluids if you are losing body fluids (dehydrated).   Antibiotic medicines that treat infections may be given.   Pain and nausea medicine, if needed.   Surgery if the inflamed diverticulum has burst.  HOME CARE INSTRUCTIONS   Try a clear liquid diet (broth, tea, or water for as long as directed by your caregiver). You may then gradually begin a low-fiber diet as tolerated. A low-fiber diet is a diet with less than 10 grams of fiber. Choose the foods below to reduce fiber in the diet:   White breads, cereals, rice, and pasta.   Cooked fruits and vegetables or soft fresh fruits and vegetables without the skin.   Ground or well-cooked tender beef, ham, veal, lamb, pork, or poultry.   Eggs and seafood.   After your diverticulitis symptoms have improved, your caregiver may  put you on a high-fiber diet. A high-fiber diet includes 14 grams of fiber for every 1000 calories consumed. For a standard 2000 calorie diet, you would need 28 grams of fiber. Follow these diet guidelines to help you increase the fiber in your diet. It is important to slowly increase the amount fiber in your diet to avoid gas, constipation, and bloating.   Choose whole-grain breads, cereals, pasta, and brown rice.   Choose fresh fruits and vegetables with the skin on. Do not overcook vegetables because the more vegetables are cooked, the more fiber is lost.   Choose more nuts, seeds, legumes, dried peas, beans, and lentils.   Look for food products that have greater than 3 grams of fiber per serving on the Nutrition Facts label.   Take all medicine as directed by your caregiver.   If your caregiver has given you a follow-up appointment, it is very important that you go. Not going could result in lasting (chronic) or permanent injury, pain, and disability. If there is any problem keeping the appointment, call to reschedule.  SEEK MEDICAL CARE IF:   Your pain does not improve.   You have a hard time advancing your diet beyond clear liquids.   Your bowel movements do not return to normal.  SEEK IMMEDIATE MEDICAL CARE IF:   Your pain becomes worse.   You have an oral temperature above 102 F (38.9 C), not controlled by medicine.   You have repeated vomiting.   You have bloody or black, tarry stools.   Symptoms that brought you to your caregiver become worse or are not getting better.  MAKE SURE YOU:   Understand these instructions.   Will watch your condition.   Will get help right away if you are not doing well or get worse.  Document Released: 08/25/2005 Document Revised: 11/04/2011 Document Reviewed: 12/21/2010 [facility] Patient Information 2012 [employer].

## 2012-04-15 NOTE — Discharge Instructions (Signed)
We have determined that your problem requires further evaluation in the emergency department.  We will take care of your transport there.  Once at the emergency department, you will be evaluated by a provider and they will order whatever treatment or tests they deem necessary.  We cannot guarantee that they will do any specific test or do any specific treatment.    Diverticulitis A diverticulum is a small pouch or sac on the colon. Diverticulosis is the presence of these diverticula on the colon. Diverticulitis is the irritation (inflammation) or infection of diverticula. CAUSES  The colon and its diverticula contain bacteria. If food particles block the tiny opening to a diverticulum, the bacteria inside can grow and cause an increase in pressure. This leads to infection and inflammation and is called diverticulitis. SYMPTOMS   Abdominal pain and tenderness. Usually, the pain is located on the left side of your abdomen. However, it could be located elsewhere.   Fever.   Bloating.   Feeling sick to your stomach (nausea).   Throwing up (vomiting).   Abnormal stools.  DIAGNOSIS  Your caregiver will take a history and perform a physical exam. Since many things can cause abdominal pain, other tests may be necessary. Tests may include:  Blood tests.   Urine tests.   X-ray of the abdomen.   CT scan of the abdomen.  Sometimes, surgery is needed to determine if diverticulitis or other conditions are causing your symptoms. TREATMENT  Most of the time, you can be treated without surgery. Treatment includes:  Resting the bowels by only having liquids for a few days. As you improve, you will need to eat a low-fiber diet.   Intravenous (IV) fluids if you are losing body fluids (dehydrated).   Antibiotic medicines that treat infections may be given.   Pain and nausea medicine, if needed.   Surgery if the inflamed diverticulum has burst.  HOME CARE INSTRUCTIONS   Try a clear liquid diet  (broth, tea, or water for as long as directed by your caregiver). You may then gradually begin a low-fiber diet as tolerated. A low-fiber diet is a diet with less than 10 grams of fiber. Choose the foods below to reduce fiber in the diet:   White breads, cereals, rice, and pasta.   Cooked fruits and vegetables or soft fresh fruits and vegetables without the skin.   Ground or well-cooked tender beef, ham, veal, lamb, pork, or poultry.   Eggs and seafood.   After your diverticulitis symptoms have improved, your caregiver may put you on a high-fiber diet. A high-fiber diet includes 14 grams of fiber for every 1000 calories consumed. For a standard 2000 calorie diet, you would need 28 grams of fiber. Follow these diet guidelines to help you increase the fiber in your diet. It is important to slowly increase the amount fiber in your diet to avoid gas, constipation, and bloating.   Choose whole-grain breads, cereals, pasta, and brown rice.   Choose fresh fruits and vegetables with the skin on. Do not overcook vegetables because the more vegetables are cooked, the more fiber is lost.   Choose more nuts, seeds, legumes, dried peas, beans, and lentils.   Look for food products that have greater than 3 grams of fiber per serving on the Nutrition Facts label.   Take all medicine as directed by your caregiver.   If your caregiver has given you a follow-up appointment, it is very important that you go. Not going could result in lasting (  chronic) or permanent injury, pain, and disability. If there is any problem keeping the appointment, call to reschedule.  SEEK MEDICAL CARE IF:   Your pain does not improve.   You have a hard time advancing your diet beyond clear liquids.   Your bowel movements do not return to normal.  SEEK IMMEDIATE MEDICAL CARE IF:   Your pain becomes worse.   You have an oral temperature above 102 F (38.9 C), not controlled by medicine.   You have repeated vomiting.    You have bloody or black, tarry stools.   Symptoms that brought you to your caregiver become worse or are not getting better.  MAKE SURE YOU:   Understand these instructions.   Will watch your condition.   Will get help right away if you are not doing well or get worse.  Document Released: 08/25/2005 Document Revised: 11/04/2011 Document Reviewed: 12/21/2010 [facility] Patient Information 2012 [place].

## 2012-04-15 NOTE — ED Notes (Signed)
Pt given something to drink.  °

## 2012-04-15 NOTE — ED Notes (Signed)
Returned from CT.

## 2012-05-04 ENCOUNTER — Encounter: Payer: Self-pay | Admitting: Cardiovascular Disease

## 2012-11-29 HISTORY — PX: CATARACT EXTRACTION W/ INTRAOCULAR LENS  IMPLANT, BILATERAL: SHX1307

## 2012-11-29 HISTORY — PX: LUMBAR SPINE SURGERY: SHX701

## 2013-03-27 ENCOUNTER — Encounter: Payer: Self-pay | Admitting: Cardiovascular Disease

## 2013-03-27 ENCOUNTER — Other Ambulatory Visit: Payer: Self-pay

## 2013-06-02 ENCOUNTER — Encounter (HOSPITAL_COMMUNITY): Payer: Self-pay | Admitting: Emergency Medicine

## 2013-06-02 ENCOUNTER — Emergency Department (INDEPENDENT_AMBULATORY_CARE_PROVIDER_SITE_OTHER)
Admission: EM | Admit: 2013-06-02 | Discharge: 2013-06-02 | Disposition: A | Discharge status: Nursing Home (Includes ICF) | Payer: Medicare Other | Source: Home / Self Care | Attending: Emergency Medicine | Admitting: Emergency Medicine

## 2013-06-02 ENCOUNTER — Emergency Department (HOSPITAL_COMMUNITY): Payer: Medicare Other

## 2013-06-02 ENCOUNTER — Emergency Department (HOSPITAL_COMMUNITY)
Admission: EM | Admit: 2013-06-02 | Discharge: 2013-06-02 | Disposition: A | Discharge status: Home / Self Care | Payer: Medicare Other | Attending: Emergency Medicine | Admitting: Emergency Medicine

## 2013-06-02 DIAGNOSIS — Z79899 Other long term (current) drug therapy: Secondary | ICD-10-CM | POA: Insufficient documentation

## 2013-06-02 DIAGNOSIS — K5732 Diverticulitis of large intestine without perforation or abscess without bleeding: Secondary | ICD-10-CM | POA: Insufficient documentation

## 2013-06-02 DIAGNOSIS — Z8639 Personal history of other endocrine, nutritional and metabolic disease: Secondary | ICD-10-CM | POA: Insufficient documentation

## 2013-06-02 DIAGNOSIS — Z90710 Acquired absence of both cervix and uterus: Secondary | ICD-10-CM | POA: Insufficient documentation

## 2013-06-02 DIAGNOSIS — Z794 Long term (current) use of insulin: Secondary | ICD-10-CM | POA: Insufficient documentation

## 2013-06-02 DIAGNOSIS — K5792 Diverticulitis of intestine, part unspecified, without perforation or abscess without bleeding: Secondary | ICD-10-CM

## 2013-06-02 DIAGNOSIS — R1032 Left lower quadrant pain: Secondary | ICD-10-CM

## 2013-06-02 DIAGNOSIS — R011 Cardiac murmur, unspecified: Secondary | ICD-10-CM | POA: Insufficient documentation

## 2013-06-02 DIAGNOSIS — R110 Nausea: Secondary | ICD-10-CM | POA: Insufficient documentation

## 2013-06-02 DIAGNOSIS — Z862 Personal history of diseases of the blood and blood-forming organs and certain disorders involving the immune mechanism: Secondary | ICD-10-CM | POA: Insufficient documentation

## 2013-06-02 DIAGNOSIS — E559 Vitamin D deficiency, unspecified: Secondary | ICD-10-CM | POA: Insufficient documentation

## 2013-06-02 DIAGNOSIS — Z872 Personal history of diseases of the skin and subcutaneous tissue: Secondary | ICD-10-CM | POA: Insufficient documentation

## 2013-06-02 DIAGNOSIS — J45909 Unspecified asthma, uncomplicated: Secondary | ICD-10-CM | POA: Insufficient documentation

## 2013-06-02 DIAGNOSIS — R509 Fever, unspecified: Secondary | ICD-10-CM | POA: Insufficient documentation

## 2013-06-02 DIAGNOSIS — Z91040 Latex allergy status: Secondary | ICD-10-CM | POA: Insufficient documentation

## 2013-06-02 DIAGNOSIS — IMO0002 Reserved for concepts with insufficient information to code with codable children: Secondary | ICD-10-CM | POA: Insufficient documentation

## 2013-06-02 DIAGNOSIS — I10 Essential (primary) hypertension: Secondary | ICD-10-CM | POA: Insufficient documentation

## 2013-06-02 DIAGNOSIS — Z87448 Personal history of other diseases of urinary system: Secondary | ICD-10-CM | POA: Insufficient documentation

## 2013-06-02 DIAGNOSIS — F411 Generalized anxiety disorder: Secondary | ICD-10-CM | POA: Insufficient documentation

## 2013-06-02 DIAGNOSIS — E7800 Pure hypercholesterolemia, unspecified: Secondary | ICD-10-CM | POA: Insufficient documentation

## 2013-06-02 DIAGNOSIS — E119 Type 2 diabetes mellitus without complications: Secondary | ICD-10-CM | POA: Insufficient documentation

## 2013-06-02 LAB — CBC WITH DIFFERENTIAL/PLATELET
Basophils Absolute: 0 10*3/uL (ref 0.0–0.1)
Hemoglobin: 11.3 g/dL — ABNORMAL LOW (ref 12.0–15.0)
MCH: 29.8 pg (ref 26.0–34.0)
MCHC: 34.1 g/dL (ref 30.0–36.0)
Monocytes Absolute: 0.5 10*3/uL (ref 0.1–1.0)
Monocytes Relative: 6 % (ref 3–12)
Neutrophils Relative %: 61 % (ref 43–77)

## 2013-06-02 LAB — COMPREHENSIVE METABOLIC PANEL
ALT: 14 U/L (ref 0–35)
AST: 14 U/L (ref 0–37)
Alkaline Phosphatase: 42 U/L (ref 39–117)
CO2: 26 mEq/L (ref 19–32)
Chloride: 103 mEq/L (ref 96–112)
GFR calc Af Amer: 33 mL/min — ABNORMAL LOW (ref >90)
GFR calc non Af Amer: 29 mL/min — ABNORMAL LOW (ref >90)
Glucose, Bld: 129 mg/dL — ABNORMAL HIGH (ref 70–99)
Sodium: 140 mEq/L (ref 135–145)
Total Bilirubin: 0.4 mg/dL (ref 0.3–1.2)

## 2013-06-02 LAB — OCCULT BLOOD, POC DEVICE: Fecal Occult Bld: POSITIVE — AB

## 2013-06-02 MED ORDER — IOHEXOL 300 MG/ML  SOLN
60.0000 mL | Freq: Once | INTRAMUSCULAR | Status: AC | PRN
  Administered 2013-06-02: 60 mL via INTRAVENOUS

## 2013-06-02 MED ORDER — CIPROFLOXACIN HCL 500 MG PO TABS: 500.0000 mg | ORAL_TABLET | Freq: Two times a day (BID) | ORAL | Status: DC

## 2013-06-02 MED ORDER — METRONIDAZOLE 500 MG PO TABS: 500.0000 mg | ORAL_TABLET | Freq: Two times a day (BID) | ORAL | Status: DC

## 2013-06-02 MED ORDER — HYDROCODONE-ACETAMINOPHEN 5-325 MG PO TABS: 1.0000 | ORAL_TABLET | ORAL | Status: DC | PRN

## 2013-06-02 MED ORDER — IOHEXOL 300 MG/ML  SOLN
25.0000 mL | Freq: Once | INTRAMUSCULAR | Status: AC | PRN
  Administered 2013-06-02: 25 mL via ORAL

## 2013-06-02 MED ORDER — MORPHINE SULFATE 4 MG/ML IJ SOLN
4.0000 mg | Freq: Once | INTRAMUSCULAR | Status: AC
  Administered 2013-06-02: 4 mg via INTRAVENOUS
  Filled 2013-06-02: qty 1

## 2013-06-02 MED ORDER — ONDANSETRON HCL 4 MG/2ML IJ SOLN
4.0000 mg | Freq: Once | INTRAMUSCULAR | Status: AC
  Administered 2013-06-02: 4 mg via INTRAVENOUS
  Filled 2013-06-02: qty 2

## 2013-06-02 NOTE — ED Provider Notes (Signed)
History    CSN: [id number] Arrival date & time 06/02/13  1125  First MD Initiated Contact with Patient 06/02/13 1139     Chief Complaint  Patient presents with  . Abdominal Pain    epigastic and lower abdominal pain since yesterday with nausea and bloating.    (Consider location/radiation/quality/duration/timing/severity/associated sxs/prior Treatment) HPI Comments: 77 year old female presents complaining of worsening left lower quadrant abdominal pain past 6 days, getting much worse yesterday and today. She also complains of a bloating feeling in the epigastrium as well as upper left back pain beneath the shoulder blade. She has been nauseous as well but has not vomited. She did have a bowel movement today which made the pain in the left lower quadrant worse. She has a history of diverticulitis, 3 attacks in the past. She states that her pain today he feels like it did previously with diverticulitis with the only difference being that this time she also has epigastric pain/bloating and pain in her upper back. She denies any urinary symptoms, diarrhea, chest pain, shortness of breath, diaphoresis. She has not tried taking anything for the pain. She currently states the pain is about a 4/10 in the left lower quadrant  Patient is a 77 y.o. female presenting with abdominal pain.  Abdominal Pain Associated symptoms include abdominal pain. Pertinent negatives include no chest pain and no shortness of breath.   Past Medical History  Diagnosis Date  . Back pain   . Hypertension   . Asthma   . Diabetes mellitus   . Murmur   . Eczema   . Degenerative disc disease   . Vitamin D deficiency   . Anemia   . Anxiety   . Kidney disease   . High cholesterol   . Hyperkalemia    Past Surgical History  Procedure Laterality Date  . Abdominal hysterectomy     History reviewed. No pertinent family history. History  Substance Use Topics  . Smoking status: Never Smoker   . Smokeless tobacco: Not  on file  . Alcohol Use: No   OB History   Grav Para Term Preterm Abortions TAB SAB Ect Mult Living                 Review of Systems  Constitutional: Positive for fever. Negative for chills.  Eyes: Negative for visual disturbance.  Respiratory: Negative for cough and shortness of breath.   Cardiovascular: Negative for chest pain, palpitations and leg swelling.  Gastrointestinal: Positive for nausea and abdominal pain. Negative for vomiting, diarrhea and constipation.  Endocrine: Negative for polydipsia and polyuria.  Genitourinary: Negative for dysuria, urgency and frequency.  Musculoskeletal: Positive for back pain. Negative for myalgias and arthralgias.  Skin: Negative for rash.  Neurological: Negative for dizziness, weakness and light-headedness.    Allergies  Latex; Metformin and related; Simvastatin; Tetracycline; and Zithromax  Home Medications   Current Outpatient Rx  Name  Route  Sig  Dispense  Refill  . amLODipine (NORVASC) 5 MG tablet   Oral   Take 5 mg by mouth daily.           . calcium carbonate (OS-CAL) 600 MG TABS   Oral   Take 600 mg by mouth daily.           . cholecalciferol (VITAMIN D) 1000 UNITS tablet   Oral   Take 1,000 Units by mouth daily.         . citalopram (CELEXA) 40 MG tablet   Oral   Take  40 mg by mouth daily.         . fenofibrate 160 MG tablet   Oral   Take 160 mg by mouth daily.         [name] glimepiride (AMARYL) 4 MG tablet   Oral   Take 4 mg by mouth daily before breakfast.           . hydrochlorothiazide (MICROZIDE) 12.5 MG capsule   Oral   Take 12.5 mg by mouth daily.           . insulin glargine (LANTUS) 100 UNIT/ML injection   Subcutaneous   Inject 60 Units into the skin at bedtime.           [name] loratadine (CLARITIN) 10 MG tablet   Oral   Take 10 mg by mouth daily as needed. For allergies         . losartan (COZAAR) 100 MG tablet   Oral   Take 100 mg by mouth daily.           . pravastatin  (PRAVACHOL) 40 MG tablet   Oral   Take 40 mg by mouth daily.         . Glucagon, rDNA, (GLUCAGON EMERGENCY IJ)   Injection   Inject 1 application as directed daily as needed. For low blood sugar         . HYDROcodone-acetaminophen (NORCO) 5-325 MG per tablet   Oral   Take 1 tablet by mouth every 6 (six) hours as needed.         . insulin aspart protamine-insulin aspart (NOVOLOG 70/30) (70-30) 100 UNIT/ML injection   Subcutaneous   Inject 10 Units into the skin 2 (two) times daily with a meal.           . Misc Natural Products (PRO HERBS VISION HEALTH PO)   Oral   Take 1 capsule by mouth daily.         [name] OVER THE COUNTER MEDICATION   Oral   Take 1 tablet by mouth daily.         [name] OVER THE COUNTER MEDICATION   Oral   Take 1 tablet by mouth daily. Multivitamin for hair, skin and nails         . PRESCRIPTION MEDICATION      lipoderm patch         . psyllium (METAMUCIL) 58.6 % packet   Oral   Take 1 packet by mouth daily.         . tizanidine (ZANAFLEX) 2 MG capsule   Oral   Take by mouth 3 (three) times daily.         . traMADol (ULTRAM) 50 MG tablet   Oral   Take 50 mg by mouth every 6 (six) hours as needed. For pain          BP 124/48  Pulse 95  Temp(Src) 100.4 F (38 C) (Oral)  Resp 20  SpO2 97% Physical Exam  Nursing note and vitals reviewed. Constitutional: She is oriented to person, place, and time. Vital signs are normal. She appears well-developed and well-nourished. She appears ill. She appears distressed.  HENT:  Head: Atraumatic.  Eyes: EOM are normal. Pupils are equal, round, and reactive to light.  Abdominal: Soft. There is tenderness (diffuse, but much worse in the LLQ). There is no rebound and no guarding.  Neurological: She is alert and oriented to person, place, and time. She has normal strength.  Skin: Skin is warm and  dry. She is not diaphoretic.  Psychiatric: She has a normal mood and affect. Her behavior is normal.  Judgment normal.    ED Course  Procedures (including critical care time) Labs Reviewed - No data to display No results found. 1. Abdominal pain, left lower quadrant     MDM  Suspect this patient has recurrence of diverticulitis. This is been going on for almost one week. I believe this patient needs a CT to rule out abscess formation. She is being sent to the emergency department for further violation and treatment.  Liam Graham, PA-C 06/02/13 [id number]

## 2013-06-02 NOTE — ED Provider Notes (Signed)
History    CSN: [id number] Arrival date & time 06/02/13  1234  First MD Initiated Contact with Patient 06/02/13 1243     Chief Complaint  Patient presents with  . Abdominal Pain   (Consider location/radiation/quality/duration/timing/severity/associated sxs/prior Treatment) The history is provided by the patient, a relative and medical records. No language interpreter was used.   Diana Harper is a 77 y.o. female  with a hx of diverticulitis, hypertension, diabetes, anemia, high cholesterol presents to the Emergency Department complaining of gradual, persistent, progressively worsening diffuse and left lower quadrant abdominal pain beginning 3 days ago. The patient has a history of diverticulitis and states that this feels the same. She has taken Vicodin at home with minimal relief. She presents today from the urgent care who sent her here for further evaluation. Patient also endorses associated low-grade fevers at home with chills. She denies headache, neck pain, chest pain, shortness of breath, nausea, vomiting, diarrhea weakness, dizziness, syncope, dysuria, hematuria.  Past Medical History  Diagnosis Date  . Back pain   . Hypertension   . Asthma   . Diabetes mellitus   . Murmur   . Eczema   . Degenerative disc disease   . Vitamin D deficiency   . Anemia   . Anxiety   . Kidney disease   . High cholesterol   . Hyperkalemia    Past Surgical History  Procedure Laterality Date  . Abdominal hysterectomy     No family history on file. History  Substance Use Topics  . Smoking status: Never Smoker   . Smokeless tobacco: Not on file  . Alcohol Use: No   OB History   Grav Para Term Preterm Abortions TAB SAB Ect Mult Living                 Review of Systems  Constitutional: Negative for fever, diaphoresis, appetite change, fatigue and unexpected weight change.  HENT: Negative for mouth sores, trouble swallowing, neck pain and neck stiffness.   Respiratory: Negative for  cough, chest tightness, shortness of breath, wheezing and stridor.   Cardiovascular: Negative for chest pain and palpitations.  Gastrointestinal: Positive for nausea and abdominal pain. Negative for vomiting, diarrhea, constipation, blood in stool, abdominal distention and rectal pain.  Genitourinary: Negative for dysuria, urgency, frequency, hematuria, flank pain and difficulty urinating.  Musculoskeletal: Negative for back pain.  Skin: Negative for rash.  Neurological: Negative for weakness.  Hematological: Negative for adenopathy.  Psychiatric/Behavioral: Negative for confusion.  All other systems reviewed and are negative.    Allergies  Latex; Metformin and related; Simvastatin; Tetracycline; and Zithromax  Home Medications   Current Outpatient Rx  Name  Route  Sig  Dispense  Refill  . amLODipine (NORVASC) 5 MG tablet   Oral   Take 5 mg by mouth daily.           . calcium carbonate (OS-CAL) 600 MG TABS   Oral   Take 600 mg by mouth daily.          . cholecalciferol (VITAMIN D) 1000 UNITS tablet   Oral   Take 1,000 Units by mouth daily.         [name] escitalopram (LEXAPRO) 20 MG tablet   Oral   Take 20 mg by mouth daily.         . fenofibrate 160 MG tablet   Oral   Take 160 mg by mouth daily.         [name] glimepiride (AMARYL) 4 MG tablet  Oral   Take 4 mg by mouth daily before breakfast.           . Glucagon, rDNA, (GLUCAGON EMERGENCY IJ)   Injection   Inject 1 application as directed daily as needed. For low blood sugar         . hydrochlorothiazide (MICROZIDE) 12.5 MG capsule   Oral   Take 12.5 mg by mouth daily.           [name] HYDROcodone-acetaminophen (NORCO) 5-325 MG per tablet   Oral   Take 1 tablet by mouth every 6 (six) hours as needed.         . insulin aspart protamine-insulin aspart (NOVOLOG 70/30) (70-30) 100 UNIT/ML injection   Subcutaneous   Inject 15 Units into the skin 2 (two) times daily with a meal.          . insulin detemir  (LEVEMIR) 100 UNIT/ML injection   Subcutaneous   Inject 40 Units into the skin 2 (two) times daily.         [name] loratadine (CLARITIN) 10 MG tablet   Oral   Take 10 mg by mouth daily as needed. For allergies         . losartan (COZAAR) 100 MG tablet   Oral   Take 100 mg by mouth daily.           . Misc Natural Products (PRO HERBS VISION HEALTH PO)   Oral   Take 1 capsule by mouth daily.         [name] OVER THE COUNTER MEDICATION   Oral   Take 1 tablet by mouth daily. Multivitamin for hair, skin and nails         . pravastatin (PRAVACHOL) 40 MG tablet   Oral   Take 40 mg by mouth every evening.          . psyllium (METAMUCIL) 58.6 % packet   Oral   Take 1 packet by mouth daily.         . ciprofloxacin (CIPRO) 500 MG tablet   Oral   Take 1 tablet (500 mg total) by mouth every 12 (twelve) hours.   20 tablet   0   . HYDROcodone-acetaminophen (NORCO/VICODIN) 5-325 MG per tablet   Oral   Take 1-2 tablets by mouth every 4 (four) hours as needed for pain.   9 tablet   0   . metroNIDAZOLE (FLAGYL) 500 MG tablet   Oral   Take 1 tablet (500 mg total) by mouth 2 (two) times daily. One po bid x 7 days   14 tablet   0    BP 105/34  Pulse 49  Temp(Src) 99.1 F (37.3 C) (Oral)  Resp 20  SpO2 98% Physical Exam  Nursing note and vitals reviewed. Constitutional: She is oriented to person, place, and time. She appears well-developed and well-nourished. No distress.  HENT:  Head: Normocephalic and atraumatic.  Mouth/Throat: Uvula is midline, oropharynx is clear and moist and mucous membranes are normal. No edematous. No oropharyngeal exudate, posterior oropharyngeal edema, posterior oropharyngeal erythema or tonsillar abscesses.  Eyes: Conjunctivae and EOM are normal. Pupils are equal, round, and reactive to light. No scleral icterus.  Neck: Normal range of motion. Neck supple.  Cardiovascular: Normal rate, regular rhythm, S1 normal, S2 normal, normal heart sounds and  intact distal pulses.   Pulses:      Radial pulses are 2+ on the right side, and 2+ on the left side.  Dorsalis pedis pulses are 2+ on the right side, and 2+ on the left side.  Pulmonary/Chest: Effort normal and breath sounds normal. No accessory muscle usage. Not tachypneic. No respiratory distress. She has no wheezes. She has no rhonchi. She has no rales. She exhibits no tenderness and no bony tenderness.  Abdominal: Soft. Normal appearance and bowel sounds are normal. She exhibits no mass. There is tenderness in the left lower quadrant. There is guarding. There is no rigidity, no rebound, no CVA tenderness, no tenderness at McBurney's point and negative Murphy's sign.  Musculoskeletal: Normal range of motion. She exhibits no edema.  Neurological: She is alert and oriented to person, place, and time. She exhibits normal muscle tone. Coordination normal.  Speech is clear and goal oriented Moves extremities without ataxia  Skin: Skin is warm and dry. No rash noted. She is not diaphoretic. No erythema.  Psychiatric: She has a normal mood and affect.    ED Course  Procedures (including critical care time) Labs Reviewed  CBC WITH DIFFERENTIAL - Abnormal; Notable for the following:    RBC 3.79 (*)    Hemoglobin 11.3 (*)    HCT 33.1 (*)    All other components within normal limits  COMPREHENSIVE METABOLIC PANEL - Abnormal; Notable for the following:    Glucose, Bld 129 (*)    Creatinine, Ser 1.69 (*)    GFR calc non Af Amer 29 (*)    GFR calc Af Amer 33 (*)    All other components within normal limits  OCCULT BLOOD, POC DEVICE - Abnormal; Notable for the following:    Fecal Occult Bld POSITIVE (*)    All other components within normal limits  LIPASE, BLOOD  URINALYSIS, ROUTINE W REFLEX MICROSCOPIC   Ct Abdomen Pelvis W Contrast  06/02/2013   *RADIOLOGY REPORT*  Clinical Data: Epigastric/lower abdominal pain, nausea  CT ABDOMEN AND PELVIS WITH CONTRAST  Technique:  Multidetector CT  imaging of the abdomen and pelvis was performed following the standard protocol during bolus administration of intravenous contrast.  Contrast: 38mL OMNIPAQUE IOHEXOL 300 MG/ML  SOLN  Comparison: 04/15/2012  Findings: Minimal dependent atelectasis at the lung bases.  Liver, spleen, pancreas, and adrenal glands within normal limits.  Suspected calcified gallstones.  No intrahepatic ductal lesion. Common duct measures 11 mm (series 5/image 40), mildly prominent, although of questionable clinical significance in a setting of normal LFTs.  Kidneys are within normal limits.  No hydronephrosis.  No evidence of bowel obstruction.  Extensive colonic diverticulosis with mild diverticulitis along the distal descending colon (series 2/image 45).  No drainable fluid collection/abscess.  No free air.  No evidence of abdominal aortic aneurysm.  Trace pelvic ascites.  No suspicious abdominopelvic lymphadenopathy.  Status post hysterectomy.  Bilateral ovaries are unremarkable.  Bladder is within normal limits.  Degenerative changes of the visualized thoracolumbar spine.  IMPRESSION: Mild colonic diverticulitis.  No drainable fluid collection/abscess.  No free air.  Suspected noncalcified gallstones.  No associated inflammatory changes.   Original Report Authenticated By: Julian Hy, M.D.   1. Diverticulitis     MDM  Diana Harper presents with abd pain, hx of diverticulitis and low grade fever (100.4).  Likely recurrent diverticulitis.  Also consideration of abd abscess and/or perforation.  NO peritoneal signs on exam, but TTP LLQ of abd.  Will obtain labs, hemoccult and CT scan along with pain control.    2:08 PM  Pt without leukocytosis but with mild anemia and positive fecal occult.  CT scan  pending.    3:54 PM CT with extensive colonic diverticulosis with mild diverticulitis along the distal descending colon without drainable fluid collection/abscess or free air.  I personally reviewed the imaging tests  through PACS system.  Patient is nontoxic, nonseptic appearing, in no apparent distress.  Patient's pain and other symptoms adequately managed in emergency department.  Fluid bolus given.  Labs, imaging and vitals reviewed.  Patient does not meet the SIRS or Sepsis criteria.  On repeat exam patient does not have a surgical abdomin and there are no peritoneal signs.  I reviewed available ER/hospitalization records through the EMR.  Will d/c home with flagyl and cipro and f/u with PCP and GI for re-evaluation.  I have also discussed reasons to return immediately to the ER.  Patient expresses understanding and agrees with plan.  Dr. Kingsley Spittle was consulted, evaluated this patient with me and agrees with the plan.      Diana Soho Ermagene Saidi, PA-C 06/02/13 1610

## 2013-06-02 NOTE — ED Provider Notes (Signed)
Medical screening examination/treatment/procedure(s) were performed by non-physician practitioner and as supervising physician I was immediately available for consultation/collaboration.  Philipp Deputy, M.D.  Harden Mo, MD 06/02/13 1728

## 2013-06-02 NOTE — ED Notes (Signed)
Reg. Asst. Requests evaluation of this pt for c/o severe abdominal pn. Pt states pn has been building for previous three days, but felt worse this morning with added nausea and "gassy, bloated" sensation in belly/upper abd. Vitals were obtained; 127/42, HR 94, Temp. 99.67F, Resp. 20, and 100% spO2. Pt roomed for expedited evaluation by provider.

## 2013-06-02 NOTE — ED Notes (Signed)
Pt reports severe epigastic and lower abdominal pain with nausea.  Also c/o pain in upper left back below shoulder blade. Symptoms present since yesterday. Pt used an enema yesterday to have bowel movement which was normal but no relief in symptoms.  Pt states that she has a bloat feeling in the epigastric area. Pt has a hx of diverticulitis.  Pt is alert and oriented no signs of acute distress.

## 2013-06-02 NOTE — ED Notes (Signed)
Pt here from urgent care with c/o abd pain that started 3 days ago , pt does have a history of diverticulosis, pt has nausea but no vomiting

## 2013-06-02 NOTE — ED Notes (Signed)
Patient unable to give urine specimen at this time.

## 2013-06-02 NOTE — ED Provider Notes (Signed)
Medical screening examination/treatment/procedure(s) were conducted as a shared visit with non-physician practitioner(s) and myself.  I personally evaluated the patient during the encounter  Pt with h/o recurrent diverticulitis, has similar pain with gradual onset over 3 days, possible fevers at home.  Had fever, abd pain at urgent care, sent to the ED for further evaluation and likely needs CT scan.  Pt with diffuse lower but more on left side tenderness on exam.  Pt has nausea, no vomiting, declines need for pain meds at this moment.    Plan to obtain abd CT.  If uncomplicated diverticulitis, will start abx and rx for same plus analgesics and refer to pcp and gi for follow up.   Saddie Benders. Dorna Mai, MD 06/02/13 2217

## 2013-07-04 ENCOUNTER — Other Ambulatory Visit: Payer: Self-pay

## 2013-10-04 ENCOUNTER — Other Ambulatory Visit: Payer: Self-pay

## 2013-10-12 ENCOUNTER — Encounter (INDEPENDENT_AMBULATORY_CARE_PROVIDER_SITE_OTHER): Payer: Medicare Other | Admitting: Ophthalmology

## 2013-10-12 DIAGNOSIS — H3530 Unspecified macular degeneration: Secondary | ICD-10-CM

## 2013-10-12 DIAGNOSIS — I10 Essential (primary) hypertension: Secondary | ICD-10-CM

## 2013-10-12 DIAGNOSIS — E11319 Type 2 diabetes mellitus with unspecified diabetic retinopathy without macular edema: Secondary | ICD-10-CM

## 2013-10-12 DIAGNOSIS — E1139 Type 2 diabetes mellitus with other diabetic ophthalmic complication: Secondary | ICD-10-CM

## 2013-10-12 DIAGNOSIS — H3581 Retinal edema: Secondary | ICD-10-CM

## 2013-10-12 DIAGNOSIS — H35039 Hypertensive retinopathy, unspecified eye: Secondary | ICD-10-CM

## 2013-10-12 DIAGNOSIS — H43819 Vitreous degeneration, unspecified eye: Secondary | ICD-10-CM

## 2013-12-10 DIAGNOSIS — E1165 Type 2 diabetes mellitus with hyperglycemia: Secondary | ICD-10-CM | POA: Diagnosis not present

## 2013-12-10 DIAGNOSIS — N390 Urinary tract infection, site not specified: Secondary | ICD-10-CM | POA: Diagnosis not present

## 2013-12-10 DIAGNOSIS — E1129 Type 2 diabetes mellitus with other diabetic kidney complication: Secondary | ICD-10-CM | POA: Diagnosis not present

## 2013-12-10 DIAGNOSIS — R300 Dysuria: Secondary | ICD-10-CM | POA: Diagnosis not present

## 2014-01-14 DIAGNOSIS — N289 Disorder of kidney and ureter, unspecified: Secondary | ICD-10-CM | POA: Diagnosis not present

## 2014-01-14 DIAGNOSIS — N183 Chronic kidney disease, stage 3 unspecified: Secondary | ICD-10-CM | POA: Diagnosis not present

## 2014-01-14 DIAGNOSIS — E559 Vitamin D deficiency, unspecified: Secondary | ICD-10-CM | POA: Diagnosis not present

## 2014-01-14 DIAGNOSIS — D649 Anemia, unspecified: Secondary | ICD-10-CM | POA: Diagnosis not present

## 2014-01-14 DIAGNOSIS — E039 Hypothyroidism, unspecified: Secondary | ICD-10-CM | POA: Diagnosis not present

## 2014-01-14 DIAGNOSIS — F329 Major depressive disorder, single episode, unspecified: Secondary | ICD-10-CM | POA: Diagnosis not present

## 2014-01-14 DIAGNOSIS — E785 Hyperlipidemia, unspecified: Secondary | ICD-10-CM | POA: Diagnosis not present

## 2014-01-14 DIAGNOSIS — E1165 Type 2 diabetes mellitus with hyperglycemia: Secondary | ICD-10-CM | POA: Diagnosis not present

## 2014-01-14 DIAGNOSIS — F3289 Other specified depressive episodes: Secondary | ICD-10-CM | POA: Diagnosis not present

## 2014-01-14 DIAGNOSIS — I129 Hypertensive chronic kidney disease with stage 1 through stage 4 chronic kidney disease, or unspecified chronic kidney disease: Secondary | ICD-10-CM | POA: Diagnosis not present

## 2014-01-14 DIAGNOSIS — E1129 Type 2 diabetes mellitus with other diabetic kidney complication: Secondary | ICD-10-CM | POA: Diagnosis not present

## 2014-01-29 DIAGNOSIS — E1129 Type 2 diabetes mellitus with other diabetic kidney complication: Secondary | ICD-10-CM | POA: Diagnosis not present

## 2014-02-11 DIAGNOSIS — Z1231 Encounter for screening mammogram for malignant neoplasm of breast: Secondary | ICD-10-CM | POA: Diagnosis not present

## 2014-02-20 ENCOUNTER — Ambulatory Visit (INDEPENDENT_AMBULATORY_CARE_PROVIDER_SITE_OTHER): Payer: Medicare Other | Admitting: Ophthalmology

## 2014-02-20 DIAGNOSIS — E1139 Type 2 diabetes mellitus with other diabetic ophthalmic complication: Secondary | ICD-10-CM | POA: Diagnosis not present

## 2014-02-20 DIAGNOSIS — H35039 Hypertensive retinopathy, unspecified eye: Secondary | ICD-10-CM

## 2014-02-20 DIAGNOSIS — E1165 Type 2 diabetes mellitus with hyperglycemia: Secondary | ICD-10-CM

## 2014-02-20 DIAGNOSIS — H3530 Unspecified macular degeneration: Secondary | ICD-10-CM

## 2014-02-20 DIAGNOSIS — E11319 Type 2 diabetes mellitus with unspecified diabetic retinopathy without macular edema: Secondary | ICD-10-CM

## 2014-02-20 DIAGNOSIS — I10 Essential (primary) hypertension: Secondary | ICD-10-CM

## 2014-04-02 DIAGNOSIS — H1045 Other chronic allergic conjunctivitis: Secondary | ICD-10-CM | POA: Diagnosis not present

## 2014-04-02 DIAGNOSIS — H04129 Dry eye syndrome of unspecified lacrimal gland: Secondary | ICD-10-CM | POA: Diagnosis not present

## 2014-04-02 DIAGNOSIS — Z961 Presence of intraocular lens: Secondary | ICD-10-CM | POA: Diagnosis not present

## 2014-05-01 DIAGNOSIS — E1129 Type 2 diabetes mellitus with other diabetic kidney complication: Secondary | ICD-10-CM | POA: Diagnosis not present

## 2014-05-14 DIAGNOSIS — F3289 Other specified depressive episodes: Secondary | ICD-10-CM | POA: Diagnosis not present

## 2014-05-14 DIAGNOSIS — F329 Major depressive disorder, single episode, unspecified: Secondary | ICD-10-CM | POA: Diagnosis not present

## 2014-05-14 DIAGNOSIS — Z6379 Other stressful life events affecting family and household: Secondary | ICD-10-CM | POA: Diagnosis not present

## 2014-05-14 DIAGNOSIS — I10 Essential (primary) hypertension: Secondary | ICD-10-CM | POA: Diagnosis not present

## 2014-05-14 DIAGNOSIS — E1129 Type 2 diabetes mellitus with other diabetic kidney complication: Secondary | ICD-10-CM | POA: Diagnosis not present

## 2014-05-14 DIAGNOSIS — E1165 Type 2 diabetes mellitus with hyperglycemia: Secondary | ICD-10-CM | POA: Diagnosis not present

## 2014-05-27 DIAGNOSIS — E559 Vitamin D deficiency, unspecified: Secondary | ICD-10-CM | POA: Diagnosis not present

## 2014-05-27 DIAGNOSIS — D638 Anemia in other chronic diseases classified elsewhere: Secondary | ICD-10-CM | POA: Diagnosis not present

## 2014-05-27 DIAGNOSIS — N183 Chronic kidney disease, stage 3 unspecified: Secondary | ICD-10-CM | POA: Diagnosis not present

## 2014-05-27 DIAGNOSIS — N2581 Secondary hyperparathyroidism of renal origin: Secondary | ICD-10-CM | POA: Diagnosis not present

## 2014-06-10 DIAGNOSIS — E1129 Type 2 diabetes mellitus with other diabetic kidney complication: Secondary | ICD-10-CM | POA: Diagnosis not present

## 2014-06-12 DIAGNOSIS — M549 Dorsalgia, unspecified: Secondary | ICD-10-CM | POA: Diagnosis not present

## 2014-06-12 DIAGNOSIS — R55 Syncope and collapse: Secondary | ICD-10-CM | POA: Diagnosis not present

## 2014-06-12 DIAGNOSIS — G8929 Other chronic pain: Secondary | ICD-10-CM | POA: Diagnosis not present

## 2014-06-12 DIAGNOSIS — N289 Disorder of kidney and ureter, unspecified: Secondary | ICD-10-CM | POA: Diagnosis not present

## 2014-06-12 DIAGNOSIS — E1129 Type 2 diabetes mellitus with other diabetic kidney complication: Secondary | ICD-10-CM | POA: Diagnosis not present

## 2014-06-12 DIAGNOSIS — R002 Palpitations: Secondary | ICD-10-CM | POA: Diagnosis not present

## 2014-07-30 DIAGNOSIS — E1129 Type 2 diabetes mellitus with other diabetic kidney complication: Secondary | ICD-10-CM | POA: Diagnosis not present

## 2014-08-28 DIAGNOSIS — N2581 Secondary hyperparathyroidism of renal origin: Secondary | ICD-10-CM | POA: Diagnosis not present

## 2014-08-28 DIAGNOSIS — I129 Hypertensive chronic kidney disease with stage 1 through stage 4 chronic kidney disease, or unspecified chronic kidney disease: Secondary | ICD-10-CM | POA: Diagnosis not present

## 2014-08-28 DIAGNOSIS — D638 Anemia in other chronic diseases classified elsewhere: Secondary | ICD-10-CM | POA: Diagnosis not present

## 2014-08-28 DIAGNOSIS — N183 Chronic kidney disease, stage 3 unspecified: Secondary | ICD-10-CM | POA: Diagnosis not present

## 2014-09-12 DIAGNOSIS — J309 Allergic rhinitis, unspecified: Secondary | ICD-10-CM | POA: Diagnosis not present

## 2014-09-12 DIAGNOSIS — J45998 Other asthma: Secondary | ICD-10-CM | POA: Diagnosis not present

## 2014-09-12 DIAGNOSIS — I10 Essential (primary) hypertension: Secondary | ICD-10-CM | POA: Diagnosis not present

## 2014-09-12 DIAGNOSIS — E1165 Type 2 diabetes mellitus with hyperglycemia: Secondary | ICD-10-CM | POA: Diagnosis not present

## 2014-09-12 DIAGNOSIS — Z23 Encounter for immunization: Secondary | ICD-10-CM | POA: Diagnosis not present

## 2014-10-29 DIAGNOSIS — E1129 Type 2 diabetes mellitus with other diabetic kidney complication: Secondary | ICD-10-CM | POA: Diagnosis not present

## 2014-10-29 DIAGNOSIS — E1165 Type 2 diabetes mellitus with hyperglycemia: Secondary | ICD-10-CM | POA: Diagnosis not present

## 2014-11-04 DIAGNOSIS — E1165 Type 2 diabetes mellitus with hyperglycemia: Secondary | ICD-10-CM | POA: Diagnosis not present

## 2014-11-04 DIAGNOSIS — M5416 Radiculopathy, lumbar region: Secondary | ICD-10-CM | POA: Diagnosis not present

## 2014-11-12 DIAGNOSIS — E1165 Type 2 diabetes mellitus with hyperglycemia: Secondary | ICD-10-CM | POA: Diagnosis not present

## 2014-11-12 DIAGNOSIS — H9193 Unspecified hearing loss, bilateral: Secondary | ICD-10-CM | POA: Diagnosis not present

## 2014-11-12 DIAGNOSIS — I10 Essential (primary) hypertension: Secondary | ICD-10-CM | POA: Diagnosis not present

## 2014-11-12 DIAGNOSIS — M5416 Radiculopathy, lumbar region: Secondary | ICD-10-CM | POA: Diagnosis not present

## 2014-11-25 ENCOUNTER — Ambulatory Visit (INDEPENDENT_AMBULATORY_CARE_PROVIDER_SITE_OTHER): Payer: Medicare Other | Admitting: Ophthalmology

## 2015-01-28 DIAGNOSIS — E1129 Type 2 diabetes mellitus with other diabetic kidney complication: Secondary | ICD-10-CM | POA: Diagnosis not present

## 2015-01-28 DIAGNOSIS — E1165 Type 2 diabetes mellitus with hyperglycemia: Secondary | ICD-10-CM | POA: Diagnosis not present

## 2015-02-06 DIAGNOSIS — E1165 Type 2 diabetes mellitus with hyperglycemia: Secondary | ICD-10-CM | POA: Diagnosis not present

## 2015-02-06 DIAGNOSIS — E1129 Type 2 diabetes mellitus with other diabetic kidney complication: Secondary | ICD-10-CM | POA: Diagnosis not present

## 2015-03-04 DIAGNOSIS — M19049 Primary osteoarthritis, unspecified hand: Secondary | ICD-10-CM | POA: Diagnosis not present

## 2015-03-04 DIAGNOSIS — E1165 Type 2 diabetes mellitus with hyperglycemia: Secondary | ICD-10-CM | POA: Diagnosis not present

## 2015-03-04 DIAGNOSIS — Z136 Encounter for screening for cardiovascular disorders: Secondary | ICD-10-CM | POA: Diagnosis not present

## 2015-03-04 DIAGNOSIS — M65359 Trigger finger, unspecified little finger: Secondary | ICD-10-CM | POA: Diagnosis not present

## 2015-03-04 DIAGNOSIS — F330 Major depressive disorder, recurrent, mild: Secondary | ICD-10-CM | POA: Diagnosis not present

## 2015-03-10 ENCOUNTER — Ambulatory Visit (INDEPENDENT_AMBULATORY_CARE_PROVIDER_SITE_OTHER): Payer: Medicare Other | Admitting: Ophthalmology

## 2015-03-12 DIAGNOSIS — L918 Other hypertrophic disorders of the skin: Secondary | ICD-10-CM | POA: Diagnosis not present

## 2015-04-30 DIAGNOSIS — E1129 Type 2 diabetes mellitus with other diabetic kidney complication: Secondary | ICD-10-CM | POA: Diagnosis not present

## 2015-04-30 DIAGNOSIS — E1165 Type 2 diabetes mellitus with hyperglycemia: Secondary | ICD-10-CM | POA: Diagnosis not present

## 2015-05-26 ENCOUNTER — Other Ambulatory Visit: Payer: Self-pay

## 2015-05-28 DIAGNOSIS — I129 Hypertensive chronic kidney disease with stage 1 through stage 4 chronic kidney disease, or unspecified chronic kidney disease: Secondary | ICD-10-CM | POA: Diagnosis not present

## 2015-05-28 DIAGNOSIS — D638 Anemia in other chronic diseases classified elsewhere: Secondary | ICD-10-CM | POA: Diagnosis not present

## 2015-05-28 DIAGNOSIS — E1129 Type 2 diabetes mellitus with other diabetic kidney complication: Secondary | ICD-10-CM | POA: Diagnosis not present

## 2015-05-28 DIAGNOSIS — N2581 Secondary hyperparathyroidism of renal origin: Secondary | ICD-10-CM | POA: Diagnosis not present

## 2015-05-28 DIAGNOSIS — N183 Chronic kidney disease, stage 3 (moderate): Secondary | ICD-10-CM | POA: Diagnosis not present

## 2015-06-04 DIAGNOSIS — E1165 Type 2 diabetes mellitus with hyperglycemia: Secondary | ICD-10-CM | POA: Diagnosis not present

## 2015-06-04 DIAGNOSIS — E782 Mixed hyperlipidemia: Secondary | ICD-10-CM | POA: Diagnosis not present

## 2015-06-04 DIAGNOSIS — M65359 Trigger finger, unspecified little finger: Secondary | ICD-10-CM | POA: Diagnosis not present

## 2015-06-04 DIAGNOSIS — I10 Essential (primary) hypertension: Secondary | ICD-10-CM | POA: Diagnosis not present

## 2015-06-13 DIAGNOSIS — R2232 Localized swelling, mass and lump, left upper limb: Secondary | ICD-10-CM | POA: Diagnosis not present

## 2015-06-13 DIAGNOSIS — E119 Type 2 diabetes mellitus without complications: Secondary | ICD-10-CM | POA: Diagnosis not present

## 2015-06-13 DIAGNOSIS — M65351 Trigger finger, right little finger: Secondary | ICD-10-CM | POA: Diagnosis not present

## 2015-06-13 DIAGNOSIS — M65332 Trigger finger, left middle finger: Secondary | ICD-10-CM | POA: Diagnosis not present

## 2015-07-02 ENCOUNTER — Encounter: Payer: Self-pay | Admitting: Gastroenterology

## 2015-07-25 ENCOUNTER — Ambulatory Visit
Admission: RE | Admit: 2015-07-25 | Discharge: 2015-07-25 | Disposition: A | Discharge status: Home / Self Care | Payer: Medicare Other | Source: Ambulatory Visit | Attending: Family Medicine | Admitting: Family Medicine

## 2015-07-25 ENCOUNTER — Other Ambulatory Visit: Payer: Self-pay | Admitting: Family Medicine

## 2015-07-25 DIAGNOSIS — M179 Osteoarthritis of knee, unspecified: Secondary | ICD-10-CM | POA: Diagnosis not present

## 2015-07-25 DIAGNOSIS — S8991XA Unspecified injury of right lower leg, initial encounter: Secondary | ICD-10-CM

## 2015-07-25 DIAGNOSIS — M25561 Pain in right knee: Secondary | ICD-10-CM | POA: Diagnosis not present

## 2015-08-20 DIAGNOSIS — E1165 Type 2 diabetes mellitus with hyperglycemia: Secondary | ICD-10-CM | POA: Diagnosis not present

## 2015-08-20 DIAGNOSIS — E1129 Type 2 diabetes mellitus with other diabetic kidney complication: Secondary | ICD-10-CM | POA: Diagnosis not present

## 2015-08-27 DIAGNOSIS — Z23 Encounter for immunization: Secondary | ICD-10-CM | POA: Diagnosis not present

## 2015-09-02 DIAGNOSIS — Z961 Presence of intraocular lens: Secondary | ICD-10-CM | POA: Diagnosis not present

## 2015-09-02 DIAGNOSIS — E113293 Type 2 diabetes mellitus with mild nonproliferative diabetic retinopathy without macular edema, bilateral: Secondary | ICD-10-CM | POA: Diagnosis not present

## 2015-10-07 DIAGNOSIS — E782 Mixed hyperlipidemia: Secondary | ICD-10-CM | POA: Diagnosis not present

## 2015-10-07 DIAGNOSIS — M179 Osteoarthritis of knee, unspecified: Secondary | ICD-10-CM | POA: Diagnosis not present

## 2015-10-07 DIAGNOSIS — E1165 Type 2 diabetes mellitus with hyperglycemia: Secondary | ICD-10-CM | POA: Diagnosis not present

## 2015-10-21 DIAGNOSIS — M179 Osteoarthritis of knee, unspecified: Secondary | ICD-10-CM | POA: Diagnosis not present

## 2015-10-21 DIAGNOSIS — I10 Essential (primary) hypertension: Secondary | ICD-10-CM | POA: Diagnosis not present

## 2015-10-21 DIAGNOSIS — E782 Mixed hyperlipidemia: Secondary | ICD-10-CM | POA: Diagnosis not present

## 2015-10-21 DIAGNOSIS — E1165 Type 2 diabetes mellitus with hyperglycemia: Secondary | ICD-10-CM | POA: Diagnosis not present

## 2015-10-22 DIAGNOSIS — D638 Anemia in other chronic diseases classified elsewhere: Secondary | ICD-10-CM | POA: Diagnosis not present

## 2015-10-22 DIAGNOSIS — E559 Vitamin D deficiency, unspecified: Secondary | ICD-10-CM | POA: Diagnosis not present

## 2015-10-22 DIAGNOSIS — N2581 Secondary hyperparathyroidism of renal origin: Secondary | ICD-10-CM | POA: Diagnosis not present

## 2015-10-22 DIAGNOSIS — N183 Chronic kidney disease, stage 3 (moderate): Secondary | ICD-10-CM | POA: Diagnosis not present

## 2015-11-19 DIAGNOSIS — E1121 Type 2 diabetes mellitus with diabetic nephropathy: Secondary | ICD-10-CM | POA: Diagnosis not present

## 2015-11-19 DIAGNOSIS — K5792 Diverticulitis of intestine, part unspecified, without perforation or abscess without bleeding: Secondary | ICD-10-CM | POA: Diagnosis not present

## 2015-11-19 DIAGNOSIS — E1165 Type 2 diabetes mellitus with hyperglycemia: Secondary | ICD-10-CM | POA: Diagnosis not present

## 2015-11-19 DIAGNOSIS — K5900 Constipation, unspecified: Secondary | ICD-10-CM | POA: Diagnosis not present

## 2015-11-19 DIAGNOSIS — Z794 Long term (current) use of insulin: Secondary | ICD-10-CM | POA: Diagnosis not present

## 2015-11-25 DIAGNOSIS — E1140 Type 2 diabetes mellitus with diabetic neuropathy, unspecified: Secondary | ICD-10-CM | POA: Diagnosis not present

## 2015-11-25 DIAGNOSIS — M79604 Pain in right leg: Secondary | ICD-10-CM | POA: Diagnosis not present

## 2015-11-25 DIAGNOSIS — K5792 Diverticulitis of intestine, part unspecified, without perforation or abscess without bleeding: Secondary | ICD-10-CM | POA: Diagnosis not present

## 2015-12-07 ENCOUNTER — Emergency Department (HOSPITAL_COMMUNITY)
Admission: EM | Admit: 2015-12-07 | Discharge: 2015-12-08 | Disposition: A | Discharge status: Home / Self Care | Payer: Medicare Other | Attending: Emergency Medicine | Admitting: Emergency Medicine

## 2015-12-07 ENCOUNTER — Encounter (HOSPITAL_COMMUNITY): Payer: Self-pay

## 2015-12-07 DIAGNOSIS — E119 Type 2 diabetes mellitus without complications: Secondary | ICD-10-CM | POA: Diagnosis not present

## 2015-12-07 DIAGNOSIS — Z8739 Personal history of other diseases of the musculoskeletal system and connective tissue: Secondary | ICD-10-CM | POA: Diagnosis not present

## 2015-12-07 DIAGNOSIS — Z91040 Latex allergy status: Secondary | ICD-10-CM | POA: Insufficient documentation

## 2015-12-07 DIAGNOSIS — Z79899 Other long term (current) drug therapy: Secondary | ICD-10-CM | POA: Diagnosis not present

## 2015-12-07 DIAGNOSIS — R011 Cardiac murmur, unspecified: Secondary | ICD-10-CM | POA: Diagnosis not present

## 2015-12-07 DIAGNOSIS — Z862 Personal history of diseases of the blood and blood-forming organs and certain disorders involving the immune mechanism: Secondary | ICD-10-CM | POA: Insufficient documentation

## 2015-12-07 DIAGNOSIS — I129 Hypertensive chronic kidney disease with stage 1 through stage 4 chronic kidney disease, or unspecified chronic kidney disease: Secondary | ICD-10-CM | POA: Diagnosis not present

## 2015-12-07 DIAGNOSIS — E559 Vitamin D deficiency, unspecified: Secondary | ICD-10-CM | POA: Insufficient documentation

## 2015-12-07 DIAGNOSIS — J45909 Unspecified asthma, uncomplicated: Secondary | ICD-10-CM | POA: Insufficient documentation

## 2015-12-07 DIAGNOSIS — R1031 Right lower quadrant pain: Secondary | ICD-10-CM

## 2015-12-07 DIAGNOSIS — Z872 Personal history of diseases of the skin and subcutaneous tissue: Secondary | ICD-10-CM | POA: Diagnosis not present

## 2015-12-07 DIAGNOSIS — K8020 Calculus of gallbladder without cholecystitis without obstruction: Secondary | ICD-10-CM | POA: Diagnosis not present

## 2015-12-07 DIAGNOSIS — K5900 Constipation, unspecified: Secondary | ICD-10-CM | POA: Insufficient documentation

## 2015-12-07 DIAGNOSIS — Z794 Long term (current) use of insulin: Secondary | ICD-10-CM | POA: Diagnosis not present

## 2015-12-07 DIAGNOSIS — Z792 Long term (current) use of antibiotics: Secondary | ICD-10-CM | POA: Diagnosis not present

## 2015-12-07 DIAGNOSIS — N189 Chronic kidney disease, unspecified: Secondary | ICD-10-CM | POA: Insufficient documentation

## 2015-12-07 DIAGNOSIS — E7800 Pure hypercholesterolemia, unspecified: Secondary | ICD-10-CM | POA: Diagnosis not present

## 2015-12-07 DIAGNOSIS — Z8659 Personal history of other mental and behavioral disorders: Secondary | ICD-10-CM | POA: Diagnosis not present

## 2015-12-07 LAB — COMPREHENSIVE METABOLIC PANEL
ALBUMIN: 4 g/dL (ref 3.5–5.0)
ALK PHOS: 40 U/L (ref 38–126)
ALT: 14 U/L (ref 14–54)
ANION GAP: 11 (ref 5–15)
AST: 22 U/L (ref 15–41)
BUN: 23 mg/dL — AB (ref 6–20)
CALCIUM: 10 mg/dL (ref 8.9–10.3)
CO2: 25 mmol/L (ref 22–32)
Chloride: 106 mmol/L (ref 101–111)
Creatinine, Ser: 1.79 mg/dL — ABNORMAL HIGH (ref 0.44–1.00)
GFR calc Af Amer: 30 mL/min — ABNORMAL LOW (ref >60)
GFR calc non Af Amer: 26 mL/min — ABNORMAL LOW (ref >60)
GLUCOSE: 91 mg/dL (ref 65–99)
POTASSIUM: 4.3 mmol/L (ref 3.5–5.1)
SODIUM: 142 mmol/L (ref 135–145)
Total Bilirubin: 0.3 mg/dL (ref 0.3–1.2)
Total Protein: 6.9 g/dL (ref 6.5–8.1)

## 2015-12-07 LAB — CBC
HEMATOCRIT: 34 % — AB (ref 36.0–46.0)
HEMOGLOBIN: 11 g/dL — AB (ref 12.0–15.0)
MCH: 29.1 pg (ref 26.0–34.0)
MCHC: 32.4 g/dL (ref 30.0–36.0)
MCV: 89.9 fL (ref 78.0–100.0)
Platelets: 273 10*3/uL (ref 150–400)
RBC: 3.78 MIL/uL — ABNORMAL LOW (ref 3.87–5.11)
RDW: 13.2 % (ref 11.5–15.5)
WBC: 6 10*3/uL (ref 4.0–10.5)

## 2015-12-07 LAB — LIPASE, BLOOD: Lipase: 41 U/L (ref 11–51)

## 2015-12-07 MED ORDER — IOHEXOL 300 MG/ML  SOLN
50.0000 mL | INTRAMUSCULAR | Status: AC
  Administered 2015-12-07: 25 mL via ORAL

## 2015-12-07 NOTE — ED Notes (Signed)
Onset today lower abd pain, worse on right and radiates to right side of back and nausea.  Pt is currently on Flagyl and Cipro for diverticulitis pain- pain ion left side of abd has resolved.

## 2015-12-07 NOTE — ED Notes (Signed)
Pt could not void at this time

## 2015-12-07 NOTE — ED Provider Notes (Addendum)
CSN: [id number]     Arrival date & time 12/07/15  2027 History   First MD Initiated Contact with Patient 12/07/15 2148     Chief Complaint  Patient presents with  . Abdominal Pain     (Consider location/radiation/quality/duration/timing/severity/associated sxs/prior Treatment) HPI Comments: Patient is a 80 year old female with a history of hypertension, diabetes, anemia, chronic kidney disease who presents today with abrupt onset of severe abdominal tenderness which is improved in the last 3 hours with some Tylenol but is still present most pronounced in the right lower quadrant radiating to the right flank. She states within the last week she had an episode of diverticulitis treated with Cipro and Flagyl but that had resolved. Today she been eating and drinking normally and had no abdominal pain until abrupt onset at about 5:30. The pain is sharp and stabbing in nature. There are no exacerbating symptoms. Since the pain started she has not attempted to eat. She had a normal bowel movement prior to arrival which did not change the pain.  She took Tylenol and states the pain is now improved to a 5 out of 10 but is still present. She has never had pain like this before. She had some mild nausea but denies any vomiting or dysuria.  Patient is a 80 y.o. female presenting with abdominal pain. The history is provided by the patient.  Abdominal Pain Pain location:  RLQ Pain quality: cramping, sharp and shooting   Pain radiates to:  R flank Pain severity:  Moderate Onset quality:  Sudden Duration:  3 hours Timing:  Constant Progression:  Improving Chronicity:  New Context comment:  Started today suddenly around 5:30 for no apparent cause Relieved by:  Acetaminophen Worsened by:  Nothing tried Associated symptoms: nausea   Associated symptoms: no anorexia, no constipation, no cough, no diarrhea, no dysuria, no fever, no hematuria, no shortness of breath and no vomiting   Risk factors: being elderly    Risk factors: has not had multiple surgeries and no recent hospitalization     Past Medical History  Diagnosis Date  . Back pain   . Hypertension   . Asthma   . Diabetes mellitus   . Murmur   . Eczema   . Degenerative disc disease   . Vitamin D deficiency   . Anemia   . Anxiety   . Kidney disease   . High cholesterol   . Hyperkalemia    Past Surgical History  Procedure Laterality Date  . Abdominal hysterectomy     History reviewed. No pertinent family history. Social History  Substance Use Topics  . Smoking status: Never Smoker   . Smokeless tobacco: None  . Alcohol Use: No   OB History    No data available     Review of Systems  Constitutional: Negative for fever.  Respiratory: Negative for cough and shortness of breath.   Gastrointestinal: Positive for nausea and abdominal pain. Negative for vomiting, diarrhea, constipation and anorexia.  Genitourinary: Negative for dysuria and hematuria.  All other systems reviewed and are negative.     Allergies  Latex; Metformin and related; Simvastatin; Tetracycline; and Zithromax  Home Medications   Prior to Admission medications   Medication Sig Start Date End Date Taking? Authorizing Provider  amLODipine (NORVASC) 5 MG tablet Take 5 mg by mouth daily.      Historical Provider, MD  calcium carbonate (OS-CAL) 600 MG TABS Take 600 mg by mouth daily.     Historical Provider, MD  cholecalciferol (  VITAMIN D) 1000 UNITS tablet Take 1,000 Units by mouth daily.    Historical Provider, MD  ciprofloxacin (CIPRO) 500 MG tablet Take 1 tablet (500 mg total) by mouth every 12 (twelve) hours. 06/02/13   Hannah Muthersbaugh, PA-C  escitalopram (LEXAPRO) 20 MG tablet Take 20 mg by mouth daily.    Historical Provider, MD  fenofibrate 160 MG tablet Take 160 mg by mouth daily.    Historical Provider, MD  glimepiride (AMARYL) 4 MG tablet Take 4 mg by mouth daily before breakfast.      Historical Provider, MD  Glucagon, rDNA, (GLUCAGON  EMERGENCY IJ) Inject 1 application as directed daily as needed. For low blood sugar    Historical Provider, MD  hydrochlorothiazide (MICROZIDE) 12.5 MG capsule Take 12.5 mg by mouth daily.      Historical Provider, MD  HYDROcodone-acetaminophen (NORCO) 5-325 MG per tablet Take 1 tablet by mouth every 6 (six) hours as needed.    Historical Provider, MD  HYDROcodone-acetaminophen (NORCO/VICODIN) 5-325 MG per tablet Take 1-2 tablets by mouth every 4 (four) hours as needed for pain. 06/02/13   Hannah Muthersbaugh, PA-C  insulin aspart protamine-insulin aspart (NOVOLOG 70/30) (70-30) 100 UNIT/ML injection Inject 15 Units into the skin 2 (two) times daily with a meal.     Historical Provider, MD  insulin detemir (LEVEMIR) 100 UNIT/ML injection Inject 40 Units into the skin 2 (two) times daily.    Historical Provider, MD  loratadine (CLARITIN) 10 MG tablet Take 10 mg by mouth daily as needed. For allergies    Historical Provider, MD  losartan (COZAAR) 100 MG tablet Take 100 mg by mouth daily.      Historical Provider, MD  metroNIDAZOLE (FLAGYL) 500 MG tablet Take 1 tablet (500 mg total) by mouth 2 (two) times daily. One po bid x 7 days 06/02/13   Jarrett Soho Muthersbaugh, PA-C  Misc Natural Products (PRO HERBS VISION HEALTH PO) Take 1 capsule by mouth daily.    Historical Provider, MD  OVER THE COUNTER MEDICATION Take 1 tablet by mouth daily. Multivitamin for hair, skin and nails    Historical Provider, MD  pravastatin (PRAVACHOL) 40 MG tablet Take 40 mg by mouth every evening.     Historical Provider, MD  psyllium (METAMUCIL) 58.6 % packet Take 1 packet by mouth daily.    Historical Provider, MD   BP 123/57 mmHg  Pulse 79  Temp(Src) 98.4 F (36.9 C) (Oral)  Resp 16  Ht 5\' 6"  (1.676 m)  Wt 169 lb 6.4 oz (76.839 kg)  BMI 27.35 kg/m2  SpO2 97% Physical Exam  Constitutional: She is oriented to person, place, and time. She appears well-developed and well-nourished. No distress.  HENT:  Head: Normocephalic and  atraumatic.  Mouth/Throat: Oropharynx is clear and moist.  Eyes: Conjunctivae and EOM are normal. Pupils are equal, round, and reactive to light.  Neck: Normal range of motion. Neck supple.  Cardiovascular: Normal rate, regular rhythm and intact distal pulses.   No murmur heard. Pulmonary/Chest: Effort normal and breath sounds normal. No respiratory distress. She has no wheezes. She has no rales.  Abdominal: Soft. She exhibits no distension. There is tenderness in the right lower quadrant. There is guarding and CVA tenderness. There is no rebound.  Right flank pain  Musculoskeletal: Normal range of motion. She exhibits no edema or tenderness.  Neurological: She is alert and oriented to person, place, and time.  Skin: Skin is warm and dry. No rash noted. No erythema.  Psychiatric: She has a  normal mood and affect. Her behavior is normal.  Nursing note and vitals reviewed.   ED Course  Procedures (including critical care time) Labs Review Labs Reviewed  COMPREHENSIVE METABOLIC PANEL - Abnormal; Notable for the following:    BUN 23 (*)    Creatinine, Ser 1.79 (*)    GFR calc non Af Amer 26 (*)    GFR calc Af Amer 30 (*)    All other components within normal limits  CBC - Abnormal; Notable for the following:    RBC 3.78 (*)    Hemoglobin 11.0 (*)    HCT 34.0 (*)    All other components within normal limits  LIPASE, BLOOD  URINALYSIS, ROUTINE W REFLEX MICROSCOPIC (NOT AT [facility])    Imaging Review Ct Abdomen Pelvis Wo Contrast  12/08/2015  CLINICAL DATA:  80 year old female with right lower quadrant/right flank abdominal pain. EXAM: CT ABDOMEN AND PELVIS WITHOUT CONTRAST TECHNIQUE: Multidetector CT imaging of the abdomen and pelvis was performed following the standard protocol without IV contrast. COMPARISON:  CT dated 06/02/2013 FINDINGS: Evaluation of this exam is limited in the absence of intravenous contrast. The visualized lung bases are clear. A 3 mm right lung base subpleural  nodule (series 2 image 7). No intra-abdominal free air or free fluid. Noncalcified stones noted within the gallbladder. No definite pericholecystic fluid. Ultrasound may provide better evaluation of the gallbladder. The liver, pancreas, spleen, and the adrenal glands appear unremarkable. There is no hydronephrosis or nephrolithiasis on either side. The visualized ureters and urinary bladder appear unremarkable. Hysterectomy. There is sigmoid diverticulosis with muscular hypertrophy. Mild perisigmoid haziness likely represent chronic inflammation. There scattered colonic diverticula without active inflammatory changes. Constipation. No evidence of bowel obstruction. Normal appendix. Mild aortoiliac atherosclerotic disease. No portal venous gas identified. There is no adenopathy. The abdominal wall soft tissues appear unremarkable. There is multilevel degenerative changes of the spine. No acute fracture. IMPRESSION: Cholelithiasis. Ultrasound may provide better evaluation of the gallbladder. Diverticulosis. Constipation. No evidence of bowel obstruction or inflammation. Normal appendix. Electronically Signed   By: Anner Crete M.D.   On: 12/08/2015 00:59   I have personally reviewed and evaluated these images and lab results as part of my medical decision-making.   EKG Interpretation None      MDM   Final diagnoses:  Right lower quadrant abdominal pain  Constipation, unspecified constipation type    Patient is a 80 year old female who was recently treated for diverticulitis with Cipro and Flagyl who presents today with 5 hours of abrupt onset abdominal pain localized to the right lower quadrant and radiating to the right flank.  She states this feels different than her diverticulitis. She denies any alcohol use and she has no upper abdominal pain concerning for cholecystitis or pancreatitis.  She denies any urinary complaints.  Patient has normal distal pulses and normal blood pressure making a AAA  less likely.  On exam patient has right lower quadrant tenderness and right flank tenderness with mild guarding and no rebound. She has no pulsating masses. No left-sided abdominal tenderness.  CBC without acute findings. CMP with persistent kidney insufficiency which is unchanged. Lipase within normal limits. Patient currently is unable to urinate but pending. Concern for possible kidney stone versus perforated viscus versus appendicitis as the cause of patient's symptoms. Low suspicion for AAA or ovarian pathology.  CT of the abdomen and pelvis with oral contrast pending. At this time patient is refusing pain medication.  1:07 AM CT is negative for acute findings.  Cholelithiasis is seen however patient has no upper abdominal pain and on reevaluation still has no right upper quadrant pain and does not have pain after eating or any symptoms suggestive of cholecystitis at this time. Findings were discussed with the patient and she will follow-up with her doctor if she begins to have right upper quadrant pain concerning for gallbladder. Also CT showed constipation which is most likely the cause of her symptoms today but no evidence of appendicitis. Patient is pain-free at this time without intervention. She was discharged home with her daughter.   Blanchie Dessert, MD 12/08/15 [id number]  Blanchie Dessert, MD 12/08/15 [id number]

## 2015-12-08 ENCOUNTER — Emergency Department (HOSPITAL_COMMUNITY): Payer: Medicare Other

## 2015-12-08 ENCOUNTER — Encounter (HOSPITAL_COMMUNITY): Payer: Self-pay | Admitting: Radiology

## 2015-12-08 DIAGNOSIS — K8020 Calculus of gallbladder without cholecystitis without obstruction: Secondary | ICD-10-CM | POA: Diagnosis not present

## 2015-12-08 DIAGNOSIS — K5900 Constipation, unspecified: Secondary | ICD-10-CM | POA: Diagnosis not present

## 2015-12-08 LAB — URINALYSIS, ROUTINE W REFLEX MICROSCOPIC
BILIRUBIN URINE: NEGATIVE
GLUCOSE, UA: NEGATIVE mg/dL
HGB URINE DIPSTICK: NEGATIVE
KETONES UR: NEGATIVE mg/dL
Nitrite: NEGATIVE
Protein, ur: NEGATIVE mg/dL
Specific Gravity, Urine: 1.008 (ref 1.005–1.030)
pH: 6 (ref 5.0–8.0)

## 2015-12-08 LAB — URINE MICROSCOPIC-ADD ON: Bacteria, UA: NONE SEEN

## 2015-12-08 NOTE — Discharge Instructions (Signed)

## 2015-12-17 DIAGNOSIS — I10 Essential (primary) hypertension: Secondary | ICD-10-CM | POA: Diagnosis not present

## 2015-12-17 DIAGNOSIS — M5416 Radiculopathy, lumbar region: Secondary | ICD-10-CM | POA: Diagnosis not present

## 2015-12-17 DIAGNOSIS — L989 Disorder of the skin and subcutaneous tissue, unspecified: Secondary | ICD-10-CM | POA: Diagnosis not present

## 2015-12-17 DIAGNOSIS — M1711 Unilateral primary osteoarthritis, right knee: Secondary | ICD-10-CM | POA: Diagnosis not present

## 2016-01-22 DIAGNOSIS — D220 Melanocytic nevi of lip: Secondary | ICD-10-CM | POA: Diagnosis not present

## 2016-01-22 DIAGNOSIS — L820 Inflamed seborrheic keratosis: Secondary | ICD-10-CM | POA: Diagnosis not present

## 2016-01-22 DIAGNOSIS — D485 Neoplasm of uncertain behavior of skin: Secondary | ICD-10-CM | POA: Diagnosis not present

## 2016-01-29 DIAGNOSIS — Z0000 Encounter for general adult medical examination without abnormal findings: Secondary | ICD-10-CM | POA: Diagnosis not present

## 2016-01-29 DIAGNOSIS — I10 Essential (primary) hypertension: Secondary | ICD-10-CM | POA: Diagnosis not present

## 2016-01-29 DIAGNOSIS — E1165 Type 2 diabetes mellitus with hyperglycemia: Secondary | ICD-10-CM | POA: Diagnosis not present

## 2016-01-29 DIAGNOSIS — Z23 Encounter for immunization: Secondary | ICD-10-CM | POA: Diagnosis not present

## 2016-01-29 DIAGNOSIS — E782 Mixed hyperlipidemia: Secondary | ICD-10-CM | POA: Diagnosis not present

## 2016-02-18 DIAGNOSIS — E1165 Type 2 diabetes mellitus with hyperglycemia: Secondary | ICD-10-CM | POA: Diagnosis not present

## 2016-02-18 DIAGNOSIS — E1140 Type 2 diabetes mellitus with diabetic neuropathy, unspecified: Secondary | ICD-10-CM | POA: Diagnosis not present

## 2016-03-04 DIAGNOSIS — L304 Erythema intertrigo: Secondary | ICD-10-CM | POA: Diagnosis not present

## 2016-03-04 DIAGNOSIS — L821 Other seborrheic keratosis: Secondary | ICD-10-CM | POA: Diagnosis not present

## 2016-03-17 ENCOUNTER — Ambulatory Visit
Admission: RE | Admit: 2016-03-17 | Discharge: 2016-03-17 | Disposition: A | Discharge status: Home / Self Care | Payer: Medicare Other | Source: Ambulatory Visit | Attending: Family Medicine | Admitting: Family Medicine

## 2016-03-17 ENCOUNTER — Other Ambulatory Visit: Payer: Self-pay | Admitting: Family Medicine

## 2016-03-17 DIAGNOSIS — R1084 Generalized abdominal pain: Secondary | ICD-10-CM

## 2016-03-17 DIAGNOSIS — R109 Unspecified abdominal pain: Secondary | ICD-10-CM | POA: Diagnosis not present

## 2016-03-17 DIAGNOSIS — K5900 Constipation, unspecified: Secondary | ICD-10-CM | POA: Diagnosis not present

## 2016-03-17 DIAGNOSIS — K219 Gastro-esophageal reflux disease without esophagitis: Secondary | ICD-10-CM | POA: Diagnosis not present

## 2016-03-24 DIAGNOSIS — K5900 Constipation, unspecified: Secondary | ICD-10-CM | POA: Diagnosis not present

## 2016-03-24 DIAGNOSIS — I10 Essential (primary) hypertension: Secondary | ICD-10-CM | POA: Diagnosis not present

## 2016-03-24 DIAGNOSIS — K219 Gastro-esophageal reflux disease without esophagitis: Secondary | ICD-10-CM | POA: Diagnosis not present

## 2016-03-24 DIAGNOSIS — R109 Unspecified abdominal pain: Secondary | ICD-10-CM | POA: Diagnosis not present

## 2016-03-26 DIAGNOSIS — K5792 Diverticulitis of intestine, part unspecified, without perforation or abscess without bleeding: Secondary | ICD-10-CM | POA: Diagnosis not present

## 2016-04-21 DIAGNOSIS — N2581 Secondary hyperparathyroidism of renal origin: Secondary | ICD-10-CM | POA: Diagnosis not present

## 2016-04-21 DIAGNOSIS — I129 Hypertensive chronic kidney disease with stage 1 through stage 4 chronic kidney disease, or unspecified chronic kidney disease: Secondary | ICD-10-CM | POA: Diagnosis not present

## 2016-04-21 DIAGNOSIS — E559 Vitamin D deficiency, unspecified: Secondary | ICD-10-CM | POA: Diagnosis not present

## 2016-04-21 DIAGNOSIS — E11319 Type 2 diabetes mellitus with unspecified diabetic retinopathy without macular edema: Secondary | ICD-10-CM | POA: Diagnosis not present

## 2016-04-21 DIAGNOSIS — D638 Anemia in other chronic diseases classified elsewhere: Secondary | ICD-10-CM | POA: Diagnosis not present

## 2016-04-21 DIAGNOSIS — E039 Hypothyroidism, unspecified: Secondary | ICD-10-CM | POA: Diagnosis not present

## 2016-04-21 DIAGNOSIS — E1165 Type 2 diabetes mellitus with hyperglycemia: Secondary | ICD-10-CM | POA: Diagnosis not present

## 2016-04-21 DIAGNOSIS — E1129 Type 2 diabetes mellitus with other diabetic kidney complication: Secondary | ICD-10-CM | POA: Diagnosis not present

## 2016-04-21 DIAGNOSIS — N183 Chronic kidney disease, stage 3 (moderate): Secondary | ICD-10-CM | POA: Diagnosis not present

## 2016-05-13 DIAGNOSIS — Z794 Long term (current) use of insulin: Secondary | ICD-10-CM | POA: Diagnosis not present

## 2016-05-13 DIAGNOSIS — E1142 Type 2 diabetes mellitus with diabetic polyneuropathy: Secondary | ICD-10-CM | POA: Diagnosis not present

## 2016-05-13 DIAGNOSIS — E1165 Type 2 diabetes mellitus with hyperglycemia: Secondary | ICD-10-CM | POA: Diagnosis not present

## 2016-05-13 DIAGNOSIS — E1122 Type 2 diabetes mellitus with diabetic chronic kidney disease: Secondary | ICD-10-CM | POA: Diagnosis not present

## 2016-05-13 DIAGNOSIS — N183 Chronic kidney disease, stage 3 (moderate): Secondary | ICD-10-CM | POA: Diagnosis not present

## 2016-06-22 ENCOUNTER — Other Ambulatory Visit: Payer: Self-pay | Admitting: Family Medicine

## 2016-06-22 ENCOUNTER — Ambulatory Visit
Admission: RE | Admit: 2016-06-22 | Discharge: 2016-06-22 | Disposition: A | Discharge status: Home / Self Care | Payer: Medicare Other | Source: Ambulatory Visit | Attending: Family Medicine | Admitting: Family Medicine

## 2016-06-22 DIAGNOSIS — R197 Diarrhea, unspecified: Secondary | ICD-10-CM | POA: Diagnosis not present

## 2016-06-22 DIAGNOSIS — R1084 Generalized abdominal pain: Secondary | ICD-10-CM

## 2016-06-22 DIAGNOSIS — R1013 Epigastric pain: Secondary | ICD-10-CM | POA: Diagnosis not present

## 2016-06-22 DIAGNOSIS — K219 Gastro-esophageal reflux disease without esophagitis: Secondary | ICD-10-CM | POA: Diagnosis not present

## 2016-06-22 DIAGNOSIS — R109 Unspecified abdominal pain: Secondary | ICD-10-CM | POA: Diagnosis not present

## 2016-06-22 DIAGNOSIS — E860 Dehydration: Secondary | ICD-10-CM | POA: Diagnosis not present

## 2016-06-28 ENCOUNTER — Ambulatory Visit
Admission: RE | Admit: 2016-06-28 | Discharge: 2016-06-28 | Disposition: A | Discharge status: Home / Self Care | Payer: Medicare Other | Source: Ambulatory Visit | Attending: Family Medicine | Admitting: Family Medicine

## 2016-06-28 DIAGNOSIS — K8020 Calculus of gallbladder without cholecystitis without obstruction: Secondary | ICD-10-CM | POA: Diagnosis not present

## 2016-06-28 DIAGNOSIS — R1084 Generalized abdominal pain: Secondary | ICD-10-CM

## 2016-06-29 DIAGNOSIS — I10 Essential (primary) hypertension: Secondary | ICD-10-CM | POA: Diagnosis not present

## 2016-06-29 DIAGNOSIS — R109 Unspecified abdominal pain: Secondary | ICD-10-CM | POA: Diagnosis not present

## 2016-06-29 DIAGNOSIS — R7989 Other specified abnormal findings of blood chemistry: Secondary | ICD-10-CM | POA: Diagnosis not present

## 2016-06-29 DIAGNOSIS — K8020 Calculus of gallbladder without cholecystitis without obstruction: Secondary | ICD-10-CM | POA: Diagnosis not present

## 2016-06-29 DIAGNOSIS — K219 Gastro-esophageal reflux disease without esophagitis: Secondary | ICD-10-CM | POA: Diagnosis not present

## 2016-07-09 ENCOUNTER — Ambulatory Visit: Payer: Self-pay | Admitting: General Surgery

## 2016-07-09 DIAGNOSIS — K8050 Calculus of bile duct without cholangitis or cholecystitis without obstruction: Secondary | ICD-10-CM | POA: Diagnosis not present

## 2016-07-09 NOTE — H&P (Signed)
History of Present Illness Geoffery Spruce MD; 07/09/2016 11:44 AM) The patient is a 80 year old female who presents for evaluation of gall stones. Patient has multiple month history of different she notes 34 months ago having a one-month history of diarrhea. This is now resolved since then she has developed intermittent epigastric pain. She notes hamburger being the thing that brought this on. She has occasional nausea. She currently is more constipated than diarrhea. She has lost 3 pounds in the last 3 months. Her pain is epigastric that she also has a more gas-like pain happening in her lower abdomen.  Last A1c was 8.2.   Other Problems Davy Pique Cloer, CMA; 07/09/2016 11:19 AM) Arthritis Back Pain Cholelithiasis Chronic Renal Failure Syndrome Depression Diabetes Mellitus Diverticulosis Emphysema Of Lung Heart murmur High blood pressure Hypercholesterolemia Oophorectomy Bilateral. Transfusion history  Past Surgical History Janeshia Galas, CMA; 07/09/2016 11:19 AM) Cataract Surgery Bilateral. Colon Polyp Removal - Colonoscopy Hysterectomy (due to cancer) - Partial Hysterectomy (not due to cancer) - Partial Sentinel Lymph Node Biopsy Spinal Surgery - Lower Back  Diagnostic Studies History Antia Berish, CMA; 07/09/2016 11:19 AM) Colonoscopy >10 years ago Mammogram within last year Pap Smear >5 years ago  Allergies Kristyana Bisceglia, CMA; 07/09/2016 11:19 AM) No Known Drug Allergies08/09/2016  Medication History (Sonya Stamas, CMA; 07/09/2016 11:20 AM) AmLODIPine Besylate (5MG  Tablet, Oral) Active. Fenofibrate (160MG  Tablet, Oral) Active. HydrALAZINE HCl (25MG  Tablet, Oral) Active. Losartan Potassium-HCTZ (100-25MG  Tablet, Oral) Active. Omeprazole (40MG  Capsule DR, Oral) Active. Sertraline HCl (50MG  Tablet, Oral) Active. Trulicity (1.5MG /0.5ML Soln Pen-inj, Subcutaneous) Active. Medications Reconciled  Social History Pina Kelm, CMA; 07/09/2016  11:19 AM) Alcohol use Occasional alcohol use. Caffeine use Carbonated beverages, Coffee, Tea. No drug use Tobacco use Never smoker.  Family History Shirleen Snarski, CMA; 07/09/2016 11:19 AM) Alcohol Abuse Brother, Family Members In General. Arthritis Family Members In General, Mother. Bleeding disorder Mother. Cancer Family Members In General. Cerebrovascular Accident Brother, Family Members In General. Depression Brother, Daughter, Family Members In [place], Sister. Diabetes Mellitus Brother, Daughter, Family Members In [place], Father, Mother, Sister, Son. Heart Disease Brother, Family Members In General. Heart disease in female family member before age 3 Hypertension Brother, Daughter, Family Members In [place], Father, Mother, Sister. Kidney Disease Brother, Family Members In [place], Father. Migraine Headache Mother. Prostate Cancer Family Members In General. Respiratory Condition Family Members In General. Seizure disorder Brother.  Pregnancy / Birth History Amparo [place], [place]; 07/09/2016 11:19 AM) Age at menarche 70 years. Age of menopause 26-60 Contraceptive History Oral contraceptives. Gravida 4 Maternal age 43-20 Para 4    Review of Systems ([place]; 07/09/2016 11:19 AM) General Present- Appetite Loss, Fatigue and Weight Loss. Not Present- Chills, Fever, Night Sweats and Weight Gain. Skin Present- Dryness. Not Present- Change in Wart/Mole, Hives, Jaundice, New Lesions, Non-Healing Wounds, Rash and Ulcer. HEENT Present- Hearing Loss and Wears glasses/contact lenses. Not Present- Earache, Hoarseness, Nose Bleed, Oral Ulcers, Ringing in the Ears, Seasonal Allergies, Sinus Pain, Sore Throat, Visual Disturbances and Yellow Eyes. Respiratory Not Present- Bloody sputum, Chronic Cough, Difficulty Breathing, Snoring and Wheezing. Breast Not Present- Breast Mass, Breast Pain, Nipple Discharge and Skin Changes. Cardiovascular Present- Leg Cramps. Not  Present- Chest Pain, Difficulty Breathing Lying Down, Palpitations, Rapid Heart Rate, Shortness of Breath and Swelling of Extremities. Gastrointestinal Present- Abdominal Pain, Bloating, Change in Bowel Habits, Constipation, Excessive gas, Gets full quickly at meals and Nausea. Not Present- Bloody Stool, Chronic diarrhea, Difficulty Swallowing, Hemorrhoids, Indigestion, Rectal Pain and Vomiting. Female Genitourinary Present-  Urgency. Not Present- Frequency, Nocturia, Painful Urination and Pelvic Pain. Musculoskeletal Present- Back Pain, Joint Pain, Joint Stiffness, Muscle Pain and Muscle Weakness. Not Present- Swelling of Extremities. Neurological Present- Tingling, Trouble walking and Weakness. Not Present- Decreased Memory, Fainting, Headaches, Numbness, Seizures and Tremor. Psychiatric Present- Change in Sleep Pattern and Depression. Not Present- Anxiety, Bipolar, Fearful and Frequent crying. Endocrine Present- Cold Intolerance. Not Present- Excessive Hunger, Hair Changes, Heat Intolerance, Hot flashes and New Diabetes. Hematology Not Present- Blood Thinners, Easy Bruising, Excessive bleeding, Gland problems, HIV and Persistent Infections.  Vitals (Sonya Emigh CMA; 07/09/2016 11:19 AM) 07/09/2016 11:19 AM Weight: 158 lb Height: 66in Body Surface Area: 1.81 m Body Mass Index: 25.5 kg/m  Pulse: 80 (Regular)  BP: 128/72 (Sitting, Left Arm, Standard)       Physical Exam Geoffery Spruce, MD; 07/09/2016 11:45 AM) General Mental Status-Alert. General Appearance-Cooperative. Orientation-Oriented X4. Posture-Normal posture.  Integumentary Global Assessment Normal Exam - Head/Face: no rashes, ulcers, lesions or evidence of photo damage. No palpable nodules or masses and Neck: no visible lesions or palpable masses.  Head and Neck Head-normocephalic, atraumatic with no lesions or palpable masses. Face Global Assessment - atraumatic. Thyroid Gland Characteristics -  normal size and consistency.  Eye Eyeball - Bilateral-Extraocular movements intact. Sclera/Conjunctiva - Bilateral-No scleral icterus, No Discharge.  ENMT Nose and Sinuses Nose - no deformities observed, no swelling present.  Chest and Lung Exam Palpation Normal exam - Non-tender. Auscultation Breath sounds - Normal.  Cardiovascular Auscultation Rhythm - Regular. Heart Sounds - S1 WNL and S2 WNL. Carotid arteries - No Carotid bruit.  Abdomen Inspection Normal Exam - No Visible peristalsis, No Abnormal pulsations and No Paradoxical movements. Palpation/Percussion Normal exam - Soft, No Rebound tenderness, No Rigidity (guarding), No hepatosplenomegaly and No Palpable abdominal masses. Tenderness - Right Upper Quadrant. Gallbladder - Negative Murphy's sign.  Peripheral Vascular Upper Extremity Palpation - Pulses bilaterally normal. Lower Extremity Palpation - Edema - Bilateral - No edema.  Neurologic Neurologic evaluation reveals -normal sensation and normal coordination.  Neuropsychiatric Mental status exam performed with findings of-able to articulate well with normal speech/language, rate, volume and coherence and thought content normal with ability to perform basic computations and apply abstract reasoning.  Musculoskeletal Normal Exam - Bilateral-Upper Extremity Strength Normal and Lower Extremity Strength Normal.    Assessment & Plan Geoffery Spruce MD; 07/09/2016 11:45 AM) CHOLEDOCHOLITHIASIS (K80.50) Impression: 80 year old female with intermittent epigastric pain and ultrasound findings consistent with concern for choledocholithiasis without biliary obstruction. We discussed the etiology of gallstones and probably cause pain. We discussed exacerbating factors including fatty meals. We discussed the details of surgery for removal of the gallbladder including general anesthesia, 4 small incisions in the patient's abdomen, removal of the patient's  gallbladder with the liver and common bile duct, and most likely outpatient procedure. We discussed risks of common bile duct injury, cystic duct stump leak, injury to liver, bleeding, infection, need for open procedure, and this cholecystectomy syndrome. The patient showed good understanding and wanted to proceed with laparoscopic cholecystectomy.  We did discuss her lower abdominal pain. This sounds more like gas related pain I recommended continuing Gas-X. I do not think that this will improve from gallbladder removal but due to the concern for choledocholithiasis I think it is still best choice to remove the gallbladder at this time. Current Plans You are being scheduled for surgery - Our schedulers will call you.  You should hear from our office's scheduling department within 5 working days about the location,  date, and time of surgery. We try to make accommodations for patient's preferences in scheduling surgery, but sometimes the OR schedule or the surgeon's schedule prevents [name] from making those accommodations.  If you have not heard from our office [phone] in 5 working days, call the office and ask for your surgeon's nurse.  If you have other questions about your diagnosis, plan, or surgery, call the office and ask for your surgeon's nurse.  Pt Education - Pamphlet Given - Laparoscopic Gallbladder Surgery: discussed with patient and provided information. Pt Education - Laparoscopic Cholecystectomy: gallbladder The anatomy & physiology of hepatobiliary & pancreatic function was discussed. The pathophysiology of gallbladder dysfunction was discussed. Natural history risks without surgery was discussed. I feel the risks of no intervention will lead to serious problems that outweigh the operative risks; therefore, I recommended cholecystectomy to remove the pathology. I explained laparoscopic techniques with possible need for an open approach. Probable cholangiogram to evaluate the bilary  tract was explained as well.  Risks such as bleeding, infection, abscess, leak, injury to other organs, need for further treatment, heart attack, death, and other risks were discussed. I noted a good likelihood this will help address the problem. Possibility that this will not correct all abdominal symptoms was explained. Goals of post-operative recovery were discussed as well. We will work to minimize complications. An educational handout further explaining the pathology and treatment options was given as well. Questions were answered. The patient expresses understanding & wishes to proceed with surgery.

## 2016-08-04 DIAGNOSIS — I10 Essential (primary) hypertension: Secondary | ICD-10-CM | POA: Diagnosis not present

## 2016-08-04 DIAGNOSIS — Z79899 Other long term (current) drug therapy: Secondary | ICD-10-CM | POA: Diagnosis not present

## 2016-08-06 DIAGNOSIS — K219 Gastro-esophageal reflux disease without esophagitis: Secondary | ICD-10-CM | POA: Diagnosis not present

## 2016-08-06 DIAGNOSIS — E782 Mixed hyperlipidemia: Secondary | ICD-10-CM | POA: Diagnosis not present

## 2016-08-06 DIAGNOSIS — K8020 Calculus of gallbladder without cholecystitis without obstruction: Secondary | ICD-10-CM | POA: Diagnosis not present

## 2016-08-06 DIAGNOSIS — I10 Essential (primary) hypertension: Secondary | ICD-10-CM | POA: Diagnosis not present

## 2016-08-12 DIAGNOSIS — E1165 Type 2 diabetes mellitus with hyperglycemia: Secondary | ICD-10-CM | POA: Diagnosis not present

## 2016-08-12 DIAGNOSIS — E1140 Type 2 diabetes mellitus with diabetic neuropathy, unspecified: Secondary | ICD-10-CM | POA: Diagnosis not present

## 2016-08-23 ENCOUNTER — Encounter (HOSPITAL_BASED_OUTPATIENT_CLINIC_OR_DEPARTMENT_OTHER): Payer: Self-pay | Admitting: *Deleted

## 2016-08-23 NOTE — Progress Notes (Signed)
NPO AFTER MN.  ARRIVE AT 0945.  NEEDS CBC, CMET,  AND EKG.  WILL TAKE GABAPENTIN, NORVASC, AND ZOLOFT AM DOS W/ SIPS OF WATER.  WILL DO HIBICLENS SHOWER HS BEFORE AND AM DOS.

## 2016-08-25 ENCOUNTER — Other Ambulatory Visit: Payer: Self-pay

## 2016-08-25 ENCOUNTER — Encounter (HOSPITAL_COMMUNITY)
Admission: RE | Disposition: A | Discharge status: Home / Self Care | Payer: Self-pay | Source: Ambulatory Visit | Attending: General Surgery

## 2016-08-25 ENCOUNTER — Ambulatory Visit (HOSPITAL_BASED_OUTPATIENT_CLINIC_OR_DEPARTMENT_OTHER): Payer: Medicare Other | Admitting: Anesthesiology

## 2016-08-25 ENCOUNTER — Encounter (HOSPITAL_BASED_OUTPATIENT_CLINIC_OR_DEPARTMENT_OTHER): Payer: Self-pay | Admitting: *Deleted

## 2016-08-25 ENCOUNTER — Ambulatory Visit (HOSPITAL_COMMUNITY): Payer: Medicare Other

## 2016-08-25 ENCOUNTER — Observation Stay (HOSPITAL_BASED_OUTPATIENT_CLINIC_OR_DEPARTMENT_OTHER)
Admission: RE | Admit: 2016-08-25 | Discharge: 2016-08-26 | Disposition: A | Discharge status: Home / Self Care | Payer: Medicare Other | Source: Ambulatory Visit | Attending: General Surgery | Admitting: General Surgery

## 2016-08-25 DIAGNOSIS — D649 Anemia, unspecified: Secondary | ICD-10-CM | POA: Diagnosis not present

## 2016-08-25 DIAGNOSIS — K8080 Other cholelithiasis without obstruction: Secondary | ICD-10-CM | POA: Diagnosis not present

## 2016-08-25 DIAGNOSIS — N183 Chronic kidney disease, stage 3 (moderate): Secondary | ICD-10-CM | POA: Insufficient documentation

## 2016-08-25 DIAGNOSIS — K8010 Calculus of gallbladder with chronic cholecystitis without obstruction: Secondary | ICD-10-CM | POA: Diagnosis present

## 2016-08-25 DIAGNOSIS — E1122 Type 2 diabetes mellitus with diabetic chronic kidney disease: Secondary | ICD-10-CM | POA: Diagnosis not present

## 2016-08-25 DIAGNOSIS — I129 Hypertensive chronic kidney disease with stage 1 through stage 4 chronic kidney disease, or unspecified chronic kidney disease: Secondary | ICD-10-CM | POA: Insufficient documentation

## 2016-08-25 DIAGNOSIS — E1142 Type 2 diabetes mellitus with diabetic polyneuropathy: Secondary | ICD-10-CM | POA: Insufficient documentation

## 2016-08-25 DIAGNOSIS — K8020 Calculus of gallbladder without cholecystitis without obstruction: Secondary | ICD-10-CM

## 2016-08-25 DIAGNOSIS — K8044 Calculus of bile duct with chronic cholecystitis without obstruction: Secondary | ICD-10-CM | POA: Diagnosis not present

## 2016-08-25 DIAGNOSIS — R339 Retention of urine, unspecified: Secondary | ICD-10-CM | POA: Insufficient documentation

## 2016-08-25 DIAGNOSIS — Z794 Long term (current) use of insulin: Secondary | ICD-10-CM | POA: Diagnosis not present

## 2016-08-25 DIAGNOSIS — E785 Hyperlipidemia, unspecified: Secondary | ICD-10-CM | POA: Insufficient documentation

## 2016-08-25 DIAGNOSIS — Z0389 Encounter for observation for other suspected diseases and conditions ruled out: Secondary | ICD-10-CM | POA: Diagnosis not present

## 2016-08-25 HISTORY — DX: Chronic kidney disease, stage 3 (moderate): N18.3

## 2016-08-25 HISTORY — PX: LAPAROSCOPIC CHOLECYSTECTOMY SINGLE SITE WITH INTRAOPERATIVE CHOLANGIOGRAM: SHX6538

## 2016-08-25 HISTORY — DX: Calculus of bile duct without cholangitis or cholecystitis without obstruction: K80.50

## 2016-08-25 HISTORY — DX: Personal history of other diseases of the digestive system: Z87.19

## 2016-08-25 HISTORY — DX: Unspecified osteoarthritis, unspecified site: M19.90

## 2016-08-25 HISTORY — DX: Chronic kidney disease, stage 3 unspecified: N18.30

## 2016-08-25 HISTORY — DX: Diverticulosis of large intestine without perforation or abscess without bleeding: K57.30

## 2016-08-25 HISTORY — DX: Type 2 diabetes mellitus without complications: E11.9

## 2016-08-25 HISTORY — DX: Calculus of gallbladder without cholecystitis without obstruction: K80.20

## 2016-08-25 HISTORY — DX: Presence of dental prosthetic device (complete) (partial): Z97.2

## 2016-08-25 HISTORY — DX: Personal history of other diseases of the respiratory system: Z87.09

## 2016-08-25 HISTORY — DX: Type 2 diabetes mellitus without complications: Z79.4

## 2016-08-25 HISTORY — DX: Polyneuropathy, unspecified: G62.9

## 2016-08-25 HISTORY — DX: Presence of external hearing-aid: Z97.4

## 2016-08-25 HISTORY — DX: Hyperlipidemia, unspecified: E78.5

## 2016-08-25 LAB — CBC WITH DIFFERENTIAL/PLATELET
BASOS ABS: 0 10*3/uL (ref 0.0–0.1)
BASOS PCT: 1 %
EOS PCT: 3 %
Eosinophils Absolute: 0.2 10*3/uL (ref 0.0–0.7)
HCT: 31.8 % — ABNORMAL LOW (ref 36.0–46.0)
Hemoglobin: 10.5 g/dL — ABNORMAL LOW (ref 12.0–15.0)
LYMPHS PCT: 48 %
Lymphs Abs: 2.7 10*3/uL (ref 0.7–4.0)
MCH: 29.6 pg (ref 26.0–34.0)
MCHC: 33 g/dL (ref 30.0–36.0)
MCV: 89.6 fL (ref 78.0–100.0)
MONO ABS: 0.4 10*3/uL (ref 0.1–1.0)
Monocytes Relative: 7 %
NEUTROS ABS: 2.2 10*3/uL (ref 1.7–7.7)
Neutrophils Relative %: 41 %
PLATELETS: 237 10*3/uL (ref 150–400)
RBC: 3.55 MIL/uL — AB (ref 3.87–5.11)
RDW: 13.1 % (ref 11.5–15.5)
WBC: 5.5 10*3/uL (ref 4.0–10.5)

## 2016-08-25 LAB — COMPREHENSIVE METABOLIC PANEL
ALBUMIN: 4.2 g/dL (ref 3.5–5.0)
ALT: 16 U/L (ref 14–54)
AST: 22 U/L (ref 15–41)
Alkaline Phosphatase: 36 U/L — ABNORMAL LOW (ref 38–126)
Anion gap: 8 (ref 5–15)
BUN: 22 mg/dL — AB (ref 6–20)
CHLORIDE: 110 mmol/L (ref 101–111)
CO2: 24 mmol/L (ref 22–32)
CREATININE: 1.63 mg/dL — AB (ref 0.44–1.00)
Calcium: 9.5 mg/dL (ref 8.9–10.3)
GFR calc Af Amer: 34 mL/min — ABNORMAL LOW (ref >60)
GFR, EST NON AFRICAN AMERICAN: 29 mL/min — AB (ref >60)
GLUCOSE: 169 mg/dL — AB (ref 65–99)
POTASSIUM: 3.6 mmol/L (ref 3.5–5.1)
Sodium: 142 mmol/L (ref 135–145)
Total Bilirubin: 0.7 mg/dL (ref 0.3–1.2)
Total Protein: 7.2 g/dL (ref 6.5–8.1)

## 2016-08-25 LAB — GLUCOSE, CAPILLARY
GLUCOSE-CAPILLARY: 250 mg/dL — AB (ref 65–99)
GLUCOSE-CAPILLARY: 260 mg/dL — AB (ref 65–99)
GLUCOSE-CAPILLARY: 407 mg/dL — AB (ref 65–99)
Glucose-Capillary: 265 mg/dL — ABNORMAL HIGH (ref 65–99)
Glucose-Capillary: 404 mg/dL — ABNORMAL HIGH (ref 65–99)

## 2016-08-25 SURGERY — LAPAROSCOPIC CHOLECYSTECTOMY SINGLE SITE WITH INTRAOPERATIVE CHOLANGIOGRAM
Anesthesia: General | Site: Abdomen

## 2016-08-25 MED ORDER — DEXAMETHASONE SODIUM PHOSPHATE 10 MG/ML IJ SOLN
INTRAMUSCULAR | Status: AC
  Filled 2016-08-25: qty 1

## 2016-08-25 MED ORDER — ONDANSETRON HCL 4 MG/2ML IJ SOLN
INTRAMUSCULAR | Status: AC
  Filled 2016-08-25: qty 2

## 2016-08-25 MED ORDER — SUGAMMADEX SODIUM 200 MG/2ML IV SOLN
INTRAVENOUS | Status: DC | PRN
  Administered 2016-08-25: 400 mg via INTRAVENOUS

## 2016-08-25 MED ORDER — FENTANYL CITRATE (PF) 100 MCG/2ML IJ SOLN
INTRAMUSCULAR | Status: DC | PRN
  Administered 2016-08-25: 50 ug via INTRAVENOUS
  Administered 2016-08-25: 25 ug via INTRAVENOUS

## 2016-08-25 MED ORDER — DEXAMETHASONE SODIUM PHOSPHATE 4 MG/ML IJ SOLN
INTRAMUSCULAR | Status: DC | PRN
  Administered 2016-08-25: 10 mg via INTRAVENOUS

## 2016-08-25 MED ORDER — HEPARIN SODIUM (PORCINE) 5000 UNIT/ML IJ SOLN
5000.0000 [IU] | Freq: Three times a day (TID) | INTRAMUSCULAR | Status: DC
  Administered 2016-08-26 (×2): 5000 [IU] via SUBCUTANEOUS
  Filled 2016-08-25 (×2): qty 1

## 2016-08-25 MED ORDER — DIPHENHYDRAMINE HCL 50 MG/ML IJ SOLN: 12.5000 mg | Freq: Four times a day (QID) | INTRAMUSCULAR | Status: DC | PRN

## 2016-08-25 MED ORDER — INSULIN ASPART 100 UNIT/ML ~~LOC~~ SOLN
3.0000 [IU] | Freq: Once | SUBCUTANEOUS | Status: AC
  Administered 2016-08-25: 3 [IU] via INTRAVENOUS
  Filled 2016-08-25: qty 0.03

## 2016-08-25 MED ORDER — GABAPENTIN 300 MG PO CAPS
ORAL_CAPSULE | ORAL | Status: AC
  Filled 2016-08-25: qty 1

## 2016-08-25 MED ORDER — ACETAMINOPHEN 500 MG PO TABS
ORAL_TABLET | ORAL | Status: AC
  Filled 2016-08-25: qty 2

## 2016-08-25 MED ORDER — PROPOFOL 10 MG/ML IV BOLUS
INTRAVENOUS | Status: DC | PRN
  Administered 2016-08-25: 40 mg via INTRAVENOUS
  Administered 2016-08-25: 160 mg via INTRAVENOUS

## 2016-08-25 MED ORDER — ACETAMINOPHEN 500 MG PO TABS
1000.0000 mg | ORAL_TABLET | ORAL | Status: AC
  Administered 2016-08-25: 1000 mg via ORAL
  Filled 2016-08-25: qty 2

## 2016-08-25 MED ORDER — GLYCOPYRROLATE 0.2 MG/ML IJ SOLN
INTRAMUSCULAR | Status: DC | PRN
  Administered 2016-08-25: 0.4 mg via INTRAVENOUS

## 2016-08-25 MED ORDER — NALOXONE HCL 0.4 MG/ML IJ SOLN
INTRAMUSCULAR | Status: DC | PRN
  Administered 2016-08-25 (×3): .1 ug via INTRAVENOUS

## 2016-08-25 MED ORDER — ACETAMINOPHEN 325 MG PO TABS: 650.0000 mg | ORAL_TABLET | Freq: Four times a day (QID) | ORAL | Status: DC | PRN

## 2016-08-25 MED ORDER — GLYCOPYRROLATE 0.2 MG/ML IV SOSY
PREFILLED_SYRINGE | INTRAVENOUS | Status: AC
  Filled 2016-08-25: qty 3

## 2016-08-25 MED ORDER — ONDANSETRON HCL 4 MG/2ML IJ SOLN: 4.0000 mg | Freq: Four times a day (QID) | INTRAMUSCULAR | Status: DC | PRN

## 2016-08-25 MED ORDER — ONDANSETRON 4 MG PO TBDP: 4.0000 mg | ORAL_TABLET | Freq: Four times a day (QID) | ORAL | Status: DC | PRN

## 2016-08-25 MED ORDER — ROCURONIUM BROMIDE 10 MG/ML (PF) SYRINGE
PREFILLED_SYRINGE | INTRAVENOUS | Status: DC | PRN
  Administered 2016-08-25: 40 mg via INTRAVENOUS

## 2016-08-25 MED ORDER — HYDROCODONE-ACETAMINOPHEN 5-325 MG PO TABS: 1.0000 | ORAL_TABLET | Freq: Four times a day (QID) | ORAL | 0 refills | Status: DC | PRN

## 2016-08-25 MED ORDER — INSULIN ASPART 100 UNIT/ML ~~LOC~~ SOLN
SUBCUTANEOUS | Status: AC
  Filled 2016-08-25: qty 1

## 2016-08-25 MED ORDER — ONDANSETRON HCL 4 MG/2ML IJ SOLN
INTRAMUSCULAR | Status: DC | PRN
  Administered 2016-08-25: 4 mg via INTRAVENOUS

## 2016-08-25 MED ORDER — DEXTROSE 5 % IV SOLN
2.0000 g | INTRAVENOUS | Status: AC
  Administered 2016-08-25: 2 g via INTRAVENOUS
  Filled 2016-08-25: qty 2

## 2016-08-25 MED ORDER — SODIUM CHLORIDE 0.9 % IV SOLN
INTRAVENOUS | Status: DC
  Administered 2016-08-25 (×2): via INTRAVENOUS
  Filled 2016-08-25: qty 1000

## 2016-08-25 MED ORDER — CHLORHEXIDINE GLUCONATE CLOTH 2 % EX PADS
6.0000 | MEDICATED_PAD | Freq: Once | CUTANEOUS | Status: DC
  Filled 2016-08-25: qty 6

## 2016-08-25 MED ORDER — IOHEXOL 300 MG/ML  SOLN
INTRAMUSCULAR | Status: DC | PRN
  Administered 2016-08-25: 7.5 mL

## 2016-08-25 MED ORDER — DIPHENHYDRAMINE HCL 12.5 MG/5ML PO ELIX: 12.5000 mg | ORAL_SOLUTION | Freq: Four times a day (QID) | ORAL | Status: DC | PRN

## 2016-08-25 MED ORDER — FENTANYL CITRATE (PF) 100 MCG/2ML IJ SOLN
25.0000 ug | INTRAMUSCULAR | Status: DC | PRN
  Administered 2016-08-25: 25 ug via INTRAVENOUS
  Filled 2016-08-25: qty 1

## 2016-08-25 MED ORDER — MIDAZOLAM HCL 2 MG/2ML IJ SOLN
INTRAMUSCULAR | Status: AC
  Filled 2016-08-25: qty 2

## 2016-08-25 MED ORDER — DEXTROSE-NACL 5-0.45 % IV SOLN: INTRAVENOUS | Status: DC

## 2016-08-25 MED ORDER — EPHEDRINE SULFATE-NACL 50-0.9 MG/10ML-% IV SOSY
PREFILLED_SYRINGE | INTRAVENOUS | Status: DC | PRN
  Administered 2016-08-25 (×2): 15 mg via INTRAVENOUS

## 2016-08-25 MED ORDER — GABAPENTIN 300 MG PO CAPS
300.0000 mg | ORAL_CAPSULE | ORAL | Status: AC
  Administered 2016-08-25: 300 mg via ORAL
  Filled 2016-08-25: qty 1

## 2016-08-25 MED ORDER — ACETAMINOPHEN 650 MG RE SUPP: 650.0000 mg | Freq: Four times a day (QID) | RECTAL | Status: DC | PRN

## 2016-08-25 MED ORDER — SODIUM CHLORIDE 0.9 % IR SOLN
Status: DC | PRN
  Administered 2016-08-25: 1000 mL

## 2016-08-25 MED ORDER — INSULIN ASPART 100 UNIT/ML ~~LOC~~ SOLN
5.0000 [IU] | Freq: Once | SUBCUTANEOUS | Status: AC
  Administered 2016-08-25: 5 [IU] via SUBCUTANEOUS

## 2016-08-25 MED ORDER — WHITE PETROLATUM GEL
Status: AC
  Filled 2016-08-25: qty 10

## 2016-08-25 MED ORDER — HEPARIN SODIUM (PORCINE) 5000 UNIT/ML IJ SOLN
INTRAMUSCULAR | Status: AC
  Filled 2016-08-25: qty 1

## 2016-08-25 MED ORDER — BUPIVACAINE HCL (PF) 0.25 % IJ SOLN
INTRAMUSCULAR | Status: DC | PRN
  Administered 2016-08-25: 20 mL

## 2016-08-25 MED ORDER — BUPIVACAINE-EPINEPHRINE 0.25% -1:200000 IJ SOLN: INTRAMUSCULAR | Status: DC | PRN

## 2016-08-25 MED ORDER — FENTANYL CITRATE (PF) 100 MCG/2ML IJ SOLN
INTRAMUSCULAR | Status: AC
  Filled 2016-08-25: qty 4

## 2016-08-25 MED ORDER — LIDOCAINE 2% (20 MG/ML) 5 ML SYRINGE
INTRAMUSCULAR | Status: DC | PRN
  Administered 2016-08-25: 100 mg via INTRAVENOUS

## 2016-08-25 MED ORDER — FENTANYL CITRATE (PF) 100 MCG/2ML IJ SOLN
INTRAMUSCULAR | Status: AC
  Filled 2016-08-25: qty 2

## 2016-08-25 MED ORDER — SIMETHICONE 80 MG PO CHEW
40.0000 mg | CHEWABLE_TABLET | Freq: Four times a day (QID) | ORAL | Status: DC | PRN
Start: 2016-08-25 — End: 2016-08-26
  Administered 2016-08-26: 40 mg via ORAL
  Filled 2016-08-25: qty 1

## 2016-08-25 MED ORDER — HYDROCODONE-ACETAMINOPHEN 5-325 MG PO TABS
1.0000 | ORAL_TABLET | ORAL | Status: DC | PRN
  Administered 2016-08-26: 1 via ORAL
  Filled 2016-08-25: qty 1

## 2016-08-25 MED ORDER — HEPARIN SODIUM (PORCINE) 5000 UNIT/ML IJ SOLN
5000.0000 [IU] | Freq: Once | INTRAMUSCULAR | Status: AC
  Administered 2016-08-25: 5000 [IU] via SUBCUTANEOUS
  Filled 2016-08-25: qty 1

## 2016-08-25 MED ORDER — INSULIN ASPART 100 UNIT/ML ~~LOC~~ SOLN: 0.0000 [IU] | Freq: Every day | SUBCUTANEOUS | Status: DC

## 2016-08-25 MED ORDER — CEFOTETAN DISODIUM-DEXTROSE 2-2.08 GM-% IV SOLR
INTRAVENOUS | Status: AC
  Filled 2016-08-25: qty 50

## 2016-08-25 MED ORDER — INSULIN ASPART 100 UNIT/ML ~~LOC~~ SOLN
0.0000 [IU] | Freq: Three times a day (TID) | SUBCUTANEOUS | Status: DC
  Administered 2016-08-26: 5 [IU] via SUBCUTANEOUS
  Administered 2016-08-26: 3 [IU] via SUBCUTANEOUS
  Administered 2016-08-26: 5 [IU] via SUBCUTANEOUS

## 2016-08-25 SURGICAL SUPPLY — 39 items
BAG SPEC RTRVL 10 TROC 200 (ENDOMECHANICALS) ×1
BLADE SURG 11 STRL SS (BLADE) ×2 IMPLANT
CATH CHOLANG 76X19 KUMAR (CATHETERS) ×1 IMPLANT
CHLORAPREP W/TINT 26ML (MISCELLANEOUS) ×2 IMPLANT
CLIP LIGATING HEM O LOK PURPLE (MISCELLANEOUS) ×2 IMPLANT
COVER BACK TABLE 60X90IN (DRAPES) ×2 IMPLANT
COVER MAYO STAND STRL (DRAPES) ×2 IMPLANT
DEVICE PMI PUNCTURE CLOSURE (MISCELLANEOUS) ×1 IMPLANT
DRAPE C-ARM 42X72 X-RAY (DRAPES) ×1 IMPLANT
DRAPE LAPAROSCOPIC ABDOMINAL (DRAPES) ×2 IMPLANT
DRAPE UTILITY XL STRL (DRAPES) ×2 IMPLANT
ELECT REM PT RETURN 9FT ADLT (ELECTROSURGICAL) ×2
ELECTRODE REM PT RTRN 9FT ADLT (ELECTROSURGICAL) ×1 IMPLANT
GLOVE BIO SURGEON STRL SZ 6.5 (GLOVE) ×1 IMPLANT
GLOVE BIOGEL PI IND STRL 7.0 (GLOVE) ×1 IMPLANT
GLOVE BIOGEL PI INDICATOR 7.0 (GLOVE) ×2
GLOVE INDICATOR 6.5 STRL GRN (GLOVE) ×2 IMPLANT
GLOVE INDICATOR 7.0 STRL GRN (GLOVE) ×2 IMPLANT
GLOVE INDICATOR 7.5 STRL GRN (GLOVE) ×5 IMPLANT
GLOVE SURG SS PI 7.0 STRL IVOR (GLOVE) ×3 IMPLANT
GOWN STRL REUS W/TWL LRG LVL3 (GOWN DISPOSABLE) ×5 IMPLANT
IV NS 1000ML (IV SOLUTION) ×2
IV NS 1000ML BAXH (IV SOLUTION) ×1 IMPLANT
LIQUID BAND (GAUZE/BANDAGES/DRESSINGS) ×3 IMPLANT
NEEDLE HYPO 22GX1.5 SAFETY (NEEDLE) ×2 IMPLANT
PACK BASIN DAY SURGERY FS (CUSTOM PROCEDURE TRAY) ×2 IMPLANT
POUCH RETRIEVAL ECOSAC 10 (ENDOMECHANICALS) ×1 IMPLANT
POUCH RETRIEVAL ECOSAC 10MM (ENDOMECHANICALS) ×1
SET IRRIG TUBING LAPAROSCOPIC (IRRIGATION / IRRIGATOR) ×1 IMPLANT
SOLUTION ANTI FOG 6CC (MISCELLANEOUS) ×2 IMPLANT
STOPCOCK 4 WAY LG BORE MALE ST (IV SETS) ×1 IMPLANT
SUT MNCRL AB 4-0 PS2 18 (SUTURE) ×2 IMPLANT
SUT VICRYL 0 UR6 27IN ABS (SUTURE) ×1 IMPLANT
SYR 20CC LL (SYRINGE) ×1 IMPLANT
SYR CONTROL 10ML LL (SYRINGE) ×2 IMPLANT
TOWEL OR 17X24 6PK STRL BLUE (TOWEL DISPOSABLE) ×4 IMPLANT
TROCAR BLADELESS OPT 5 100 (ENDOMECHANICALS) ×6 IMPLANT
TROCAR XCEL 12X100 BLDLESS (ENDOMECHANICALS) ×2 IMPLANT
TUBING INSUF HEATED (TUBING) ×2 IMPLANT

## 2016-08-25 NOTE — Progress Notes (Signed)
Dr. Kieth Brightly aware of continued sleepiness, CBG 265, and insulin order from Dr. Smith Robert. Will admit overnight.

## 2016-08-25 NOTE — H&P (Signed)
Diana Harper is an 80 y.o. female.   Chief Complaint: abdominal pain HPI: 80 yo female with multiple gallbladder attacks. These are described and RUQ pain with some nausea. She went to the ER once and was diagnosed with cholelithiasis with CBD dilation. Since she has not had as severe attacks but does have occasional symptoms.  Past Medical History:  Diagnosis Date  . Anemia   . Anxiety   . Arthritis   . Biliary colic   . Cholelithiasis   . CKD (chronic kidney disease), stage III    nephrologist--  dr Meredeth Ide  . Diverticulosis of colon   . History of asthma    1980's  no longer problem since 1980's  . History of diverticulitis of colon    recurrent--  2014;  2013;  2012  . Hyperlipidemia   . Hypertension   . Insulin dependent type 2 diabetes mellitus ([place])    followed by dr Jenny Reichmann lambeth ([facility])   . Murmur   . Peripheral neuropathy ([place])   . Vitamin D deficiency   . Wears hearing aid    bilateral  . Wears partial dentures    lower partial and upper full    Past Surgical History:  Procedure Laterality Date  . ABDOMINAL HYSTERECTOMY  1970's  . CARDIOVASCULAR STRESS TEST  07/30/2009   normal exercise lexiscan nuclear study w/ no ischemia/  normal LV funciton and wall motion, ef 77%  . CATARACT EXTRACTION W/ INTRAOCULAR LENS  IMPLANT, BILATERAL  2014  . LUMBAR SPINE SURGERY  2014  . REMOVAL AXILLA CYST Right 1990's    History reviewed. No pertinent family history. Social History:  reports that she has never smoked. She has never used smokeless tobacco. She reports that she does not drink alcohol or use drugs.  Allergies:  Allergies  Allergen Reactions  . Latex Anaphylaxis and Shortness Of Breath    : severe respiratory distress  . Asa [Aspirin] Other (See Comments)    Avoids due to hx gastric ulcer  . Lisinopril Cough  . Metformin And Related Diarrhea  . Simvastatin Rash  . Tetracycline Rash  . Zithromax [Azithromycin] Rash    Medications Prior to Admission   Medication Sig Dispense Refill  . amLODipine (NORVASC) 5 MG tablet Take 10 mg by mouth every morning.     . Calcium Carbonate-Vitamin D (CALTRATE 600+D PO) Take 1 tablet by mouth daily.    . cholecalciferol (VITAMIN D) 1000 UNITS tablet Take 1,000 Units by mouth daily.    . fenofibrate 160 MG tablet Take 160 mg by mouth every morning.     . gabapentin (NEURONTIN) 100 MG capsule Take 100 mg by mouth every morning.    . hydrALAZINE (APRESOLINE) 25 MG tablet Take 25 mg by mouth 2 (two) times daily.    . insulin regular human CONCENTRATED (HUMULIN R) 500 UNIT/ML injection Inject 40-45 Units into the skin 2 (two) times daily with a meal. Takes 45u at 1100 am and 40u at 2000 pm    . losartan-hydrochlorothiazide (HYZAAR) 100-25 MG tablet Take 1 tablet by mouth every morning.    . Multiple Vitamins-Minerals (HAIR/SKIN/NAILS) CAPS Take 1 tablet by mouth every morning.    . Multiple Vitamins-Minerals (PRESERVISION/LUTEIN) CAPS Take 1 capsule by mouth daily.    . sertraline (ZOLOFT) 50 MG tablet Take 50 mg by mouth every morning.    . loratadine (CLARITIN) 10 MG tablet Take 10 mg by mouth daily as needed. For allergies      No results  found for this or any previous visit (from the past 48 hour(s)). No results found.  Review of Systems  Constitutional: Negative for chills and fever.  HENT: Negative for hearing loss.   Eyes: Negative for blurred vision and double vision.  Respiratory: Negative for cough and hemoptysis.   Cardiovascular: Negative for chest pain and palpitations.  Gastrointestinal: Negative for abdominal pain, nausea and vomiting.  Genitourinary: Negative for dysuria and urgency.  Musculoskeletal: Negative for myalgias and neck pain.  Skin: Negative for itching and rash.  Neurological: Negative for dizziness, tingling and headaches.  Endo/Heme/Allergies: Does not bruise/bleed easily.  Psychiatric/Behavioral: Negative for depression and suicidal ideas.    Blood pressure (!)  141/58, pulse 90, temperature 98.7 F (37.1 C), temperature source Oral, resp. rate 16, height 5\' 6"  (1.676 m), weight 72.6 kg (160 lb), SpO2 99 %. Physical Exam  Vitals reviewed. Constitutional: She is oriented to person, place, and time. She appears well-developed and well-nourished.  HENT:  Head: Normocephalic and atraumatic.  Eyes: Conjunctivae and EOM are normal. Pupils are equal, round, and reactive to light.  Neck: Normal range of motion. Neck supple.  Cardiovascular: Normal rate and regular rhythm.   Respiratory: Effort normal and breath sounds normal.  GI: Soft. Bowel sounds are normal. She exhibits no distension. There is no tenderness.  Musculoskeletal: Normal range of motion.  Neurological: She is alert and oriented to person, place, and time.  Skin: Skin is warm and dry.  Psychiatric: She has a normal mood and affect. Her behavior is normal.     Assessment/Plan 80 yo female with symptomatic cholelithiasis with possible choledocholithiasis by ductal dilation -lap chole w ioc today -planned outpatient -multimodal analgesia  [name], MD 08/25/2016, 10:29 AM

## 2016-08-25 NOTE — Anesthesia Preprocedure Evaluation (Addendum)
Anesthesia Evaluation  Patient identified by MRN, date of birth, ID band Patient awake    Reviewed: Allergy & Precautions, NPO status , Patient's Chart, lab work & pertinent test results  Airway Mallampati: II  TM Distance: >3 FB Neck ROM: Full    Dental no notable dental hx. (+) Dental Advisory Given, Edentulous Upper, Partial Lower   Pulmonary neg pulmonary ROS,    Pulmonary exam normal breath sounds clear to auscultation       Cardiovascular hypertension, Pt. on medications Normal cardiovascular exam Rhythm:Regular Rate:Normal     Neuro/Psych  Neuromuscular disease negative psych ROS   GI/Hepatic negative GI ROS, Neg liver ROS,   Endo/Other  diabetes, Type 2, Insulin Dependent  Renal/GU Renal disease  negative genitourinary   Musculoskeletal negative musculoskeletal ROS (+) Arthritis ,   Abdominal   Peds negative pediatric ROS (+)  Hematology  (+) anemia ,   Anesthesia Other Findings   Reproductive/Obstetrics negative OB ROS                          Anesthesia Physical Anesthesia Plan  ASA: III  Anesthesia Plan: General   Post-op Pain Management:    Induction: Intravenous  Airway Management Planned: Oral ETT  Additional Equipment:   Intra-op Plan:   Post-operative Plan: Extubation in OR  Informed Consent: I have reviewed the patients History and Physical, chart, labs and discussed the procedure including the risks, benefits and alternatives for the proposed anesthesia with the patient or authorized representative who has indicated his/her understanding and acceptance.   Dental advisory given  Plan Discussed with: CRNA  Anesthesia Plan Comments:         Anesthesia Quick Evaluation

## 2016-08-25 NOTE — Anesthesia Postprocedure Evaluation (Signed)
Anesthesia Post Note  Patient: Diana Harper  Procedure(s) Performed: Procedure(s) (LRB): LAPAROSCOPIC CHOLECYSTECTOMY WITH INTRAOPERATIVE CHOLANGIOGRAM (N/A)  Patient location during evaluation: PACU Anesthesia Type: General Level of consciousness: awake and alert Pain management: pain level controlled Vital Signs Assessment: post-procedure vital signs reviewed and stable Respiratory status: spontaneous breathing, nonlabored ventilation, respiratory function stable and patient connected to nasal cannula oxygen Cardiovascular status: blood pressure returned to baseline and stable Postop Assessment: no signs of nausea or vomiting Anesthetic complications: no    Last Vitals:  Vitals:   08/25/16 1745 08/25/16 1827  BP:  (!) 111/53  Pulse: 91 93  Resp: (!) 21 12  Temp: 36.1 C 36.6 C    Last Pain:  Vitals:   08/25/16 1730  TempSrc:   PainSc: 0-No pain                 Effie Berkshire

## 2016-08-25 NOTE — Discharge Instructions (Signed)
HOME CARE INSTRUCTIONS - LAPAROSCOPY ° °Wound Care: The bandaids or dressing which are placed over the skin openings may be removed the day after surgery. The incision should be kept clean and dry. The stitches do not need to be removed. Should the incision become sore, red, and swollen after the first week, check with your doctor. ° °Personal Hygiene: Shower the day after your procedure. Always wipe from front to back after elimination.  ° °Activity: Do not drive or operate any equipment today. The effects of the anesthesia are still present and drowsiness may result. Rest today, not necessarily flat bed rest, just take it easy. You may resume your normal activity in one to three days or as instructed by your physician. ° °Sexual Activity: You resume sexual activity as indicated by your physician. °If your laparoscopy was for a sterilization ( tubes tied ), continue current method of birth control until after your next period or ask for specific instructions from your doctor. ° °Diet: Eat a light diet as desired this evening. You may resume a regular diet tomorrow. ° °Return to Work: Two to three days or as indicated by your doctor. ° °Expectations °After Surgery: Your surgery will cause vaginal drainage or spotting which may continue for 2-3 days. Mild abdominal discomfort or tenderness is not unusual and some shoulder pain may also be noted which can be relieved by lying flat in pain. ° °Call Your Doctor °If these Occur:  Persistent or heavy bleeding at incision site °      Redness or swelling around incision °      Elevation of temperature greater than 100 degrees F ° °Call for follow-up appointment . ° ° ° ° °Post Anesthesia Home Care Instructions ° °Activity: °Get plenty of rest for the remainder of the day. A responsible adult should stay with you for 24 hours following the procedure.  °For the next 24 hours, DO NOT: °-Drive a car °-Operate machinery °-Drink alcoholic beverages °-Take any medication unless  instructed by your physician °-Make any legal decisions or sign important papers. ° °Meals: °Start with liquid foods such as gelatin or soup. Progress to regular foods as tolerated. Avoid greasy, spicy, heavy foods. If nausea and/or vomiting occur, drink only clear liquids until the nausea and/or vomiting subsides. Call your physician if vomiting continues. ° °Special Instructions/Symptoms: °Your throat may feel dry or sore from the anesthesia or the breathing tube placed in your throat during surgery. If this causes discomfort, gargle with warm salt water. The discomfort should disappear within 24 hours. ° °If you had a scopolamine patch placed behind your ear for the management of post- operative nausea and/or vomiting: ° °1. The medication in the patch is effective for 72 hours, after which it should be removed.  Wrap patch in a tissue and discard in the trash. Wash hands thoroughly with soap and water. °2. You may remove the patch earlier than 72 hours if you experience unpleasant side effects which may include dry mouth, dizziness or visual disturbances. °3. Avoid touching the patch. Wash your hands with soap and water after contact with the patch. °  ° °

## 2016-08-25 NOTE — Transfer of Care (Signed)
Immediate Anesthesia Transfer of Care Note  Patient: Diana Harper  Procedure(s) Performed: Procedure(s): LAPAROSCOPIC CHOLECYSTECTOMY WITH INTRAOPERATIVE CHOLANGIOGRAM (N/A)  Patient Location: PACU  Anesthesia Type:General  Level of Consciousness: awake, alert , oriented, patient cooperative and responds to stimulation  Airway & Oxygen Therapy: Patient Spontanous Breathing and Patient connected to nasal cannula oxygen  Post-op Assessment: Report given to RN and Post -op Vital signs reviewed and stable  Post vital signs: Reviewed and stable  Last Vitals:  Vitals:   08/25/16 0953  BP: (!) 141/58  Pulse: 90  Resp: 16  Temp: 37.1 C    Last Pain:  Vitals:   08/25/16 1009  TempSrc:   PainSc: 1       Patients Stated Pain Goal: 8 (56/25/63 8937)  Complications: No apparent anesthesia complications

## 2016-08-25 NOTE — Anesthesia Procedure Notes (Addendum)
Procedure Name: Intubation Date/Time: 08/25/2016 1:06 PM Performed by: Wanita Chamberlain Pre-anesthesia Checklist: Patient identified, Timeout performed, Emergency Drugs available, Suction available and Patient being monitored Patient Re-evaluated:Patient Re-evaluated prior to inductionOxygen Delivery Method: Circle system utilized Preoxygenation: Pre-oxygenation with 100% oxygen Intubation Type: IV induction Ventilation: Mask ventilation without difficulty Tube type: Oral Tube size: 7.0 mm Number of attempts: 1 Airway Equipment and Method: Stylet Placement Confirmation: ETT inserted through vocal cords under direct vision,  positive ETCO2 and breath sounds checked- equal and bilateral Secured at: 21 cm Tube secured with: Tape Dental Injury: Teeth and Oropharynx as per pre-operative assessment

## 2016-08-25 NOTE — Progress Notes (Signed)
CRITICAL VALUE ALERT  Critical value received:  CBG 407  Date of notification:  08/25/2016   Time of notification:  9:37 PM   Critical value read back:Yes.    Nurse who received alert:  Jeanie Sewer   MD notified (1st page):  DR Lamar Blinks  Time of first page:  9:38 PM   MD notified (2nd page):  Time of second page:  Responding MD:  Lamar Blinks  Time MD responded:

## 2016-08-25 NOTE — Op Note (Signed)
PATIENT:  Diana Harper  80 y.o. female  PRE-OPERATIVE DIAGNOSIS:   choledocholithiasis  POST-OPERATIVE DIAGNOSIS:   Chronic calculous cholecystitis  PROCEDURE:  Procedure(s): LAPAROSCOPIC CHOLECYSTECTOMY WITH INTRAOPERATIVE CHOLANGIOGRAM   SURGEON:  Surgeon(s): Arta Bruce Kinsinger, MD  ASSISTANT: none  ANESTHESIA:   local and general  Indications for procedure: [name] is a 80 y.o. female with symptoms of Abdominal pain, RUQ pain and Nausea consistent with gallbladder disease, Confirmed by Ultrasound.  Description of procedure: The patient was brought into the operative suite, placed supine. Anesthesia was administered with endotracheal tube. Patient was strapped in place and foot board was secured. All pressure points were offloaded by foam padding. The patient was prepped and draped in the usual sterile fashion.  A small incision was made to the right of the umbilicus. A 4mm trocar was inserted into the peritoneal cavity with optical entry. Pneumoperitoneum was applied with high flow low pressure. 2 28mm trocars were placed in the RUQ. A 44mm trocar was placed in the subxiphoid space. All trocars sites were first anesthesized with 0.25% marcaine with epinephrine in the subcutaneous and preperitoneal layers. Next the patient was placed in reverse trendelenberg. The gallbladder had a few small adhesions of the omentum, these were taken down with cautery.  The gallbladder was retracted cephalad and lateral. The peritoneum was reflected off the infundibulum working lateral to medial. The cystic duct and cystic artery were identified and further dissection revealed a critical view, due to concern for choledocholithiasis a cholangiogram was performed with [place]. Normal ductal anatomy was visualized. The cystic duct and cystic artery were doubly clipped and ligated.   The gallbladder was removed off the liver bed with cautery. The Gallbladder was placed in a specimen bag. The  gallbladder fossa was irrigated and hemostasis was applied with cautery. The gallbladder was removed via the 63mm trocar. The fascial defect was closed with interrupted 0 vicryl suture via laparoscopic trans-fascial suture passer. Pneumoperitoneum was removed, all trocar were removed. All incisions were closed with 4-0 monocryl subcuticular stitch. The patient woke from anesthesia and was brought to PACU in stable condition. All counts were correct  Findings: multiple medium sized stones of the gallbladder, normal ductal anatomy without filling defect.  Specimen: gallbladder  Blood loss: Total I/O In: 500 [I.V.:500] Out: -  ml  Local anesthesia: 53ml 3.2% marcaine  Complications: none  PLAN OF CARE: Discharge to home after PACU  PATIENT DISPOSITION:  PACU - hemodynamically stable.  Gurney Maxin, M.D. General, Bariatric, & Minimally Invasive Surgery [facility]

## 2016-08-25 NOTE — Progress Notes (Signed)
Received pt from PACU, stable, arousable, abd sites d/i, denies pain. VSS. Report to night shift RN to follow. Daughter at bedside, ordered pt CL tray. Able to tolerate ginger ale without nausea. SRP, RN

## 2016-08-26 ENCOUNTER — Encounter (HOSPITAL_BASED_OUTPATIENT_CLINIC_OR_DEPARTMENT_OTHER): Payer: Self-pay | Admitting: General Surgery

## 2016-08-26 DIAGNOSIS — N183 Chronic kidney disease, stage 3 (moderate): Secondary | ICD-10-CM | POA: Diagnosis not present

## 2016-08-26 DIAGNOSIS — E1122 Type 2 diabetes mellitus with diabetic chronic kidney disease: Secondary | ICD-10-CM | POA: Diagnosis not present

## 2016-08-26 DIAGNOSIS — I129 Hypertensive chronic kidney disease with stage 1 through stage 4 chronic kidney disease, or unspecified chronic kidney disease: Secondary | ICD-10-CM | POA: Diagnosis not present

## 2016-08-26 DIAGNOSIS — K8044 Calculus of bile duct with chronic cholecystitis without obstruction: Secondary | ICD-10-CM | POA: Diagnosis not present

## 2016-08-26 DIAGNOSIS — E1142 Type 2 diabetes mellitus with diabetic polyneuropathy: Secondary | ICD-10-CM | POA: Diagnosis not present

## 2016-08-26 DIAGNOSIS — E785 Hyperlipidemia, unspecified: Secondary | ICD-10-CM | POA: Diagnosis not present

## 2016-08-26 LAB — GLUCOSE, CAPILLARY
GLUCOSE-CAPILLARY: 243 mg/dL — AB (ref 65–99)
GLUCOSE-CAPILLARY: 188 mg/dL — AB (ref 65–99)
Glucose-Capillary: 248 mg/dL — ABNORMAL HIGH (ref 65–99)

## 2016-08-26 MED ORDER — SERTRALINE HCL 50 MG PO TABS
50.0000 mg | ORAL_TABLET | Freq: Every morning | ORAL | Status: DC
  Administered 2016-08-26: 50 mg via ORAL
  Filled 2016-08-26: qty 1

## 2016-08-26 MED ORDER — LOSARTAN POTASSIUM-HCTZ 100-25 MG PO TABS: 1.0000 | ORAL_TABLET | Freq: Every day | ORAL | Status: DC

## 2016-08-26 MED ORDER — LORATADINE 10 MG PO TABS: 10.0000 mg | ORAL_TABLET | Freq: Every day | ORAL | Status: DC | PRN

## 2016-08-26 MED ORDER — LOSARTAN POTASSIUM 50 MG PO TABS
100.0000 mg | ORAL_TABLET | Freq: Every day | ORAL | Status: DC
  Administered 2016-08-26: 100 mg via ORAL
  Filled 2016-08-26: qty 2

## 2016-08-26 MED ORDER — DULAGLUTIDE 1.5 MG/0.5ML ~~LOC~~ SOAJ: 1.5000 mg | SUBCUTANEOUS | Status: DC

## 2016-08-26 MED ORDER — INSULIN REGULAR HUMAN (CONC) 500 UNIT/ML ~~LOC~~ SOPN: 40.0000 [IU] | PEN_INJECTOR | Freq: Every day | SUBCUTANEOUS | Status: DC

## 2016-08-26 MED ORDER — FENOFIBRATE 160 MG PO TABS
160.0000 mg | ORAL_TABLET | Freq: Every morning | ORAL | Status: DC
  Administered 2016-08-26: 160 mg via ORAL
  Filled 2016-08-26: qty 1

## 2016-08-26 MED ORDER — HYDRALAZINE HCL 25 MG PO TABS
25.0000 mg | ORAL_TABLET | Freq: Two times a day (BID) | ORAL | Status: DC
Start: 2016-08-26 — End: 2016-08-26
  Administered 2016-08-26: 25 mg via ORAL
  Filled 2016-08-26: qty 1

## 2016-08-26 MED ORDER — AMLODIPINE BESYLATE 10 MG PO TABS
10.0000 mg | ORAL_TABLET | Freq: Every day | ORAL | Status: DC
  Administered 2016-08-26: 10 mg via ORAL
  Filled 2016-08-26: qty 1

## 2016-08-26 MED ORDER — INSULIN REGULAR HUMAN (CONC) 500 UNIT/ML ~~LOC~~ SOPN
45.0000 [IU] | PEN_INJECTOR | Freq: Every day | SUBCUTANEOUS | Status: DC
  Administered 2016-08-26: 45 [IU] via SUBCUTANEOUS
  Filled 2016-08-26: qty 3

## 2016-08-26 MED ORDER — GABAPENTIN 100 MG PO CAPS
200.0000 mg | ORAL_CAPSULE | Freq: Every day | ORAL | Status: DC
  Administered 2016-08-26: 200 mg via ORAL
  Filled 2016-08-26: qty 2

## 2016-08-26 MED ORDER — CALCIUM CARBONATE-VITAMIN D 500-200 MG-UNIT PO TABS
ORAL_TABLET | Freq: Every day | ORAL | Status: DC
  Administered 2016-08-26: 1 via ORAL
  Filled 2016-08-26: qty 1

## 2016-08-26 MED ORDER — HYDROCHLOROTHIAZIDE 25 MG PO TABS
25.0000 mg | ORAL_TABLET | Freq: Every day | ORAL | Status: DC
  Administered 2016-08-26: 25 mg via ORAL
  Filled 2016-08-26: qty 1

## 2016-08-26 NOTE — Progress Notes (Signed)
Inpatient Diabetes Program Recommendations  AACE/ADA: New Consensus Statement on Inpatient Glycemic Control (2015)  Target Ranges:  Prepandial:   less than 140 mg/dL      Peak postprandial:   less than 180 mg/dL (1-2 hours)      Critically ill patients:  140 - 180 mg/dL   Lab Results  Component Value Date   GLUCAP 248 (H) 08/26/2016   HGBA1C 9.4 (H) 11/15/2011   Review of Glycemic Control  Diabetes history: DM 2 Outpatient Diabetes medications: Trulicity 1.5 weekly on Friday, U-500 concentrated insulin 45 units lunch, 40 units supper Current orders for Inpatient glycemic control: Trulicity 1.5, E-332 concentrated insulin 45 units lunch, 40 units supper, Novolog Moderate + HS scale   Inpatient Diabetes Program Recommendations:   Patient received 10 of Decadron yesterday for surgery at 1336 pm. Patient also did not get his U-500 insulin as well. Home meds to be started today. Will watch trends while inpatient.  Thanks,  Tama Headings RN, MSN, [facility] Inpatient Diabetes Coordinator Team Pager [phone] (8a-5p)

## 2016-08-26 NOTE — Progress Notes (Signed)
  Progress Note: General Surgery Service   Subjective: Slow to wake up yesterday, urinary retention overnight, glc >400 overnight  Objective: Vital signs in last 24 hours: Temp:  [97 F (36.1 C)-99.1 F (37.3 C)] 98.8 F (37.1 C) (09/28 0506) Pulse Rate:  [90-104] 95 (09/28 0506) Resp:  [12-21] 18 (09/28 0506) BP: (96-146)/(50-71) 146/71 (09/28 0506) SpO2:  [90 %-100 %] 100 % (09/28 0506) Weight:  [72.6 kg (160 lb)] 72.6 kg (160 lb) (09/27 0953)    Intake/Output from previous day: 09/27 0701 - 09/28 0700 In: 800 [I.V.:800] Out: 1100 [Urine:1050; Blood:50] Intake/Output this shift: No intake/output data recorded.  Lungs: CTAB  Cardiovascular: RRR  Abd: soft, ATTP, nondistended, wounds c/d/i  Extremities: no edema  Neuro: AOx4  Lab Results: CBC   Recent Labs  08/25/16 1000  WBC 5.5  HGB 10.5*  HCT 31.8*  PLT 237   BMET  Recent Labs  08/25/16 1000  NA 142  K 3.6  CL 110  CO2 24  GLUCOSE 169*  BUN 22*  CREATININE 1.63*  CALCIUM 9.5   PT/INR No results for input(s): LABPROT, INR in the last 72 hours. ABG No results for input(s): PHART, HCO3 in the last 72 hours.  Invalid input(s): PCO2, PO2  Studies/Results:  Anti-infectives: Anti-infectives    Start     Dose/Rate Route Frequency Ordered Stop   08/25/16 1032  cefoTEtan (CEFOTAN) 2 g in dextrose 5 % 50 mL IVPB     2 g 100 mL/hr over 30 Minutes Intravenous On call to O.R. 08/25/16 1032 08/25/16 1310      Medications: Scheduled Meds: . heparin subcutaneous  5,000 Units Subcutaneous Q8H  . insulin aspart  0-15 Units Subcutaneous TID WC  . insulin aspart  0-5 Units Subcutaneous QHS   Continuous Infusions: . dextrose 5 % and 0.45% NaCl     PRN Meds:.acetaminophen **OR** acetaminophen, diphenhydrAMINE **OR** diphenhydrAMINE, HYDROcodone-acetaminophen, ondansetron **OR** ondansetron (ZOFRAN) IV, simethicone  Assessment/Plan: Patient Active Problem List   Diagnosis Date Noted  . Chronic  cholecystitis with calculus 08/25/2016  . Syncope 11/15/2011  . Diverticulitis 11/15/2011   s/p Procedure(s): LAPAROSCOPIC CHOLECYSTECTOMY WITH INTRAOPERATIVE CHOLANGIOGRAM 08/25/2016 POD 1 -restart home medications -dc foley at 0900 -ambulate with assistance -advance to diabetic diet   LOS: 0 days   [name], MD Pg# [phone] [facility]

## 2016-08-26 NOTE — Anesthesia Postprocedure Evaluation (Deleted)
Anesthesia Post Note  Patient: Diana Harper  Procedure(s) Performed: Procedure(s) (LRB): LAPAROSCOPIC CHOLECYSTECTOMY WITH INTRAOPERATIVE CHOLANGIOGRAM (N/A)  Patient location during evaluation: PACU Anesthesia Type: General Level of consciousness: awake and alert Pain management: pain level controlled Vital Signs Assessment: post-procedure vital signs reviewed and stable Respiratory status: spontaneous breathing, nonlabored ventilation, respiratory function stable and patient connected to nasal cannula oxygen Cardiovascular status: blood pressure returned to baseline and stable Postop Assessment: no signs of nausea or vomiting Anesthetic complications: no    Last Vitals:  Vitals:   08/26/16 0506 08/26/16 0945  BP: (!) 146/71 (!) 119/53  Pulse: 95   Resp: 18   Temp: 37.1 C     Last Pain:  Vitals:   08/26/16 0506  TempSrc: Oral  PainSc:                  Marshel Golubski J

## 2016-08-26 NOTE — Care Management Obs Status (Signed)
[place] NOTIFICATION   Patient Details  Name: Diana Harper MRN: [id number] Date of Birth: 03-Apr-1936   Medicare Observation Status Notification Given:  Yes    Purcell Mouton, RN 08/26/2016, 1:39 PM

## 2016-08-30 NOTE — Discharge Summary (Signed)
Physician Discharge Summary  Diana Harper YKD:[id number] DOB: December 11, 1935 DOA: 08/25/2016  PCP: Diana Pilar, MD  Admit date: 08/25/2016 Discharge date: 08/30/2016  Recommendations for Outpatient Follow-up:  1.  (include homehealth, outpatient follow-up instructions, specific recommendations for PCP to follow-up on, etc.)  Follow-up Information    Diana Skinner, MD In 3 weeks.   Specialty:  General Surgery Contact information: [name] [address] [phone]          Discharge Diagnoses:  Active Problems:   Chronic cholecystitis with calculus   Surgical Procedure: lap chole  Discharge Condition: Good Disposition: Home  Diet recommendation: low fat diet   Hospital Course:  80 yo female underwent lap chole, post op she was slow to awake and therefore kept overnight. POD 1 she had urinary retention and required foley placement. Later in the day she was able to void, ambulate and tolerate a diet and was discharged home.  Discharge Instructions  Discharge Instructions    Call MD for:  difficulty breathing, headache or visual disturbances    Complete by:  As directed    Call MD for:  difficulty breathing, headache or visual disturbances    Complete by:  As directed    Call MD for:  hives    Complete by:  As directed    Call MD for:  persistant nausea and vomiting    Complete by:  As directed    Call MD for:  persistant nausea and vomiting    Complete by:  As directed    Call MD for:  redness, tenderness, or signs of infection (pain, swelling, redness, odor or green/yellow discharge around incision site)    Complete by:  As directed    Call MD for:  redness, tenderness, or signs of infection (pain, swelling, redness, odor or green/yellow discharge around incision site)    Complete by:  As directed    Call MD for:  severe uncontrolled pain    Complete by:  As directed    Call MD for:  severe uncontrolled pain    Complete by:   As directed    Call MD for:  temperature >100.4    Complete by:  As directed    Call MD for:  temperature >100.4    Complete by:  As directed    Diet - low sodium heart healthy    Complete by:  As directed    Diet - low sodium heart healthy    Complete by:  As directed    Discharge wound care:    Complete by:  As directed    Ok to shower tomorrow. Glue will likely peel off in 1-3 weeks. No bandage required   Discharge wound care:    Complete by:  As directed    Ok to shower tomorrow  Glue will likely peel off in 1-3 weeks   Driving Restrictions    Complete by:  As directed    No driving while on narcotics   Increase activity slowly    Complete by:  As directed    Increase activity slowly    Complete by:  As directed    Lifting restrictions    Complete by:  As directed    No lifting greater than 20 pounds for 3 weeks   Lifting restrictions    Complete by:  As directed    Do not lift more than 20 pounds for 3-4 weeks       Medication List  TAKE these medications   acetaminophen 500 MG tablet Commonly known as:  TYLENOL Take 1,000 mg by mouth every 6 (six) hours as needed for mild pain, moderate pain, fever or headache.   amLODipine 5 MG tablet Commonly known as:  NORVASC Take 10 mg by mouth daily with breakfast.   CALTRATE 600+D PO Take 1 tablet by mouth daily with breakfast.   fenofibrate 160 MG tablet Take 160 mg by mouth every morning.   gabapentin 100 MG capsule Commonly known as:  NEURONTIN Take 200 mg by mouth daily with breakfast.   hydrALAZINE 25 MG tablet Commonly known as:  APRESOLINE Take 25 mg by mouth 2 (two) times daily.   HYDROcodone-acetaminophen 5-325 MG tablet Commonly known as:  NORCO/VICODIN Take 1-2 tablets by mouth every 6 (six) hours as needed for moderate pain.   insulin regular human CONCENTRATED 500 UNIT/ML injection Commonly known as:  HUMULIN R Inject 40-45 Units into the skin 2 (two) times daily with a meal. Takes 45u at  1100 am and 40u at 2000 pm   loratadine 10 MG tablet Commonly known as:  CLARITIN Take 10 mg by mouth daily as needed for allergies.   losartan-hydrochlorothiazide 100-25 MG tablet Commonly known as:  HYZAAR Take 1 tablet by mouth daily with breakfast.   PRESERVISION/LUTEIN Caps Take 1 capsule by mouth daily with breakfast.   HAIR/SKIN/NAILS Caps Take 1 tablet by mouth every morning.   sertraline 50 MG tablet Commonly known as:  ZOLOFT Take 50 mg by mouth every morning.   TRULICITY 1.5 WG/9.5AO Sopn Generic drug:  Dulaglutide Inject 1.5 mg into the skin every Friday.   Vitamin D3 5000 units Tabs Take 1 tablet by mouth daily with breakfast.      Follow-up Information    Diana Skinner, MD In 3 weeks.   Specialty:  General Surgery Contact information: [name] [address] [phone]            The results of significant diagnostics from this hospitalization (including imaging, microbiology, ancillary and laboratory) are listed below for reference.    Significant Diagnostic Studies: Dg Cholangiogram Operative  Result Date: 08/25/2016 CLINICAL DATA:  Intraoperative cholangiogram during laparoscopic cholecystectomy. EXAM: INTRAOPERATIVE CHOLANGIOGRAM FLUOROSCOPY TIME:  18 seconds COMPARISON:  Abdominal ultrasound - 06/28/2016 FINDINGS: Intraoperative cholangiographic images of the right upper abdominal quadrant during laparoscopic cholecystectomy are provided for review. Contrast injection demonstrates selective cannulation of the central aspect of the cystic duct. There is passage of contrast through the central aspect of the cystic duct with filling of a mildly dilated common bile duct. There is passage of contrast though the CBD and into the descending portion of the duodenum. There is minimal reflux of injected contrast into the common hepatic duct and central aspect of the non dilated intrahepatic biliary system. There are no discrete  filling defects within the opacified portions of the biliary system to suggest the presence of choledocholithiasis. IMPRESSION: No evidence of choledocholithiasis. Electronically Signed   By: Diana Harper M.D.   On: 08/25/2016 15:40    Labs: Basic Metabolic Panel:  Recent Labs Lab 08/25/16 1000  NA 142  K 3.6  CL 110  CO2 24  GLUCOSE 169*  BUN 22*  CREATININE 1.63*  CALCIUM 9.5   Liver Function Tests:  Recent Labs Lab 08/25/16 1000  AST 22  ALT 16  ALKPHOS 36*  BILITOT 0.7  PROT 7.2  ALBUMIN 4.2    CBC:  Recent Labs Lab 08/25/16  1000  WBC 5.5  NEUTROABS 2.2  HGB 10.5*  HCT 31.8*  MCV 89.6  PLT 237    CBG:  Recent Labs Lab 08/25/16 2131 08/25/16 2221 08/26/16 0747 08/26/16 1152 08/26/16 1649  GLUCAP 407* 404* 248* 243* 188*    Active Problems:   Chronic cholecystitis with calculus   Time coordinating discharge: <86min

## 2016-09-01 DIAGNOSIS — Z961 Presence of intraocular lens: Secondary | ICD-10-CM | POA: Diagnosis not present

## 2016-09-01 DIAGNOSIS — E113293 Type 2 diabetes mellitus with mild nonproliferative diabetic retinopathy without macular edema, bilateral: Secondary | ICD-10-CM | POA: Diagnosis not present

## 2016-09-01 DIAGNOSIS — H353131 Nonexudative age-related macular degeneration, bilateral, early dry stage: Secondary | ICD-10-CM | POA: Diagnosis not present

## 2016-09-21 DIAGNOSIS — I10 Essential (primary) hypertension: Secondary | ICD-10-CM | POA: Diagnosis not present

## 2016-09-21 DIAGNOSIS — R7989 Other specified abnormal findings of blood chemistry: Secondary | ICD-10-CM | POA: Diagnosis not present

## 2016-09-24 DIAGNOSIS — R944 Abnormal results of kidney function studies: Secondary | ICD-10-CM | POA: Diagnosis not present

## 2016-09-24 DIAGNOSIS — Z23 Encounter for immunization: Secondary | ICD-10-CM | POA: Diagnosis not present

## 2016-09-24 DIAGNOSIS — K8020 Calculus of gallbladder without cholecystitis without obstruction: Secondary | ICD-10-CM | POA: Diagnosis not present

## 2016-09-24 DIAGNOSIS — I10 Essential (primary) hypertension: Secondary | ICD-10-CM | POA: Diagnosis not present

## 2016-09-24 DIAGNOSIS — K219 Gastro-esophageal reflux disease without esophagitis: Secondary | ICD-10-CM | POA: Diagnosis not present

## 2016-10-12 IMAGING — CR DG ABDOMEN ACUTE W/ 1V CHEST
3 series · 3 of 3 positions shown · non-contrast
Comparison: 03/17/2016

CLINICAL DATA: Epigastric, lower abdominal pain. Diarrhea, nausea
for 4 weeks.

EXAM:
DG ABDOMEN ACUTE W/ 1V CHEST

[w chest pa]
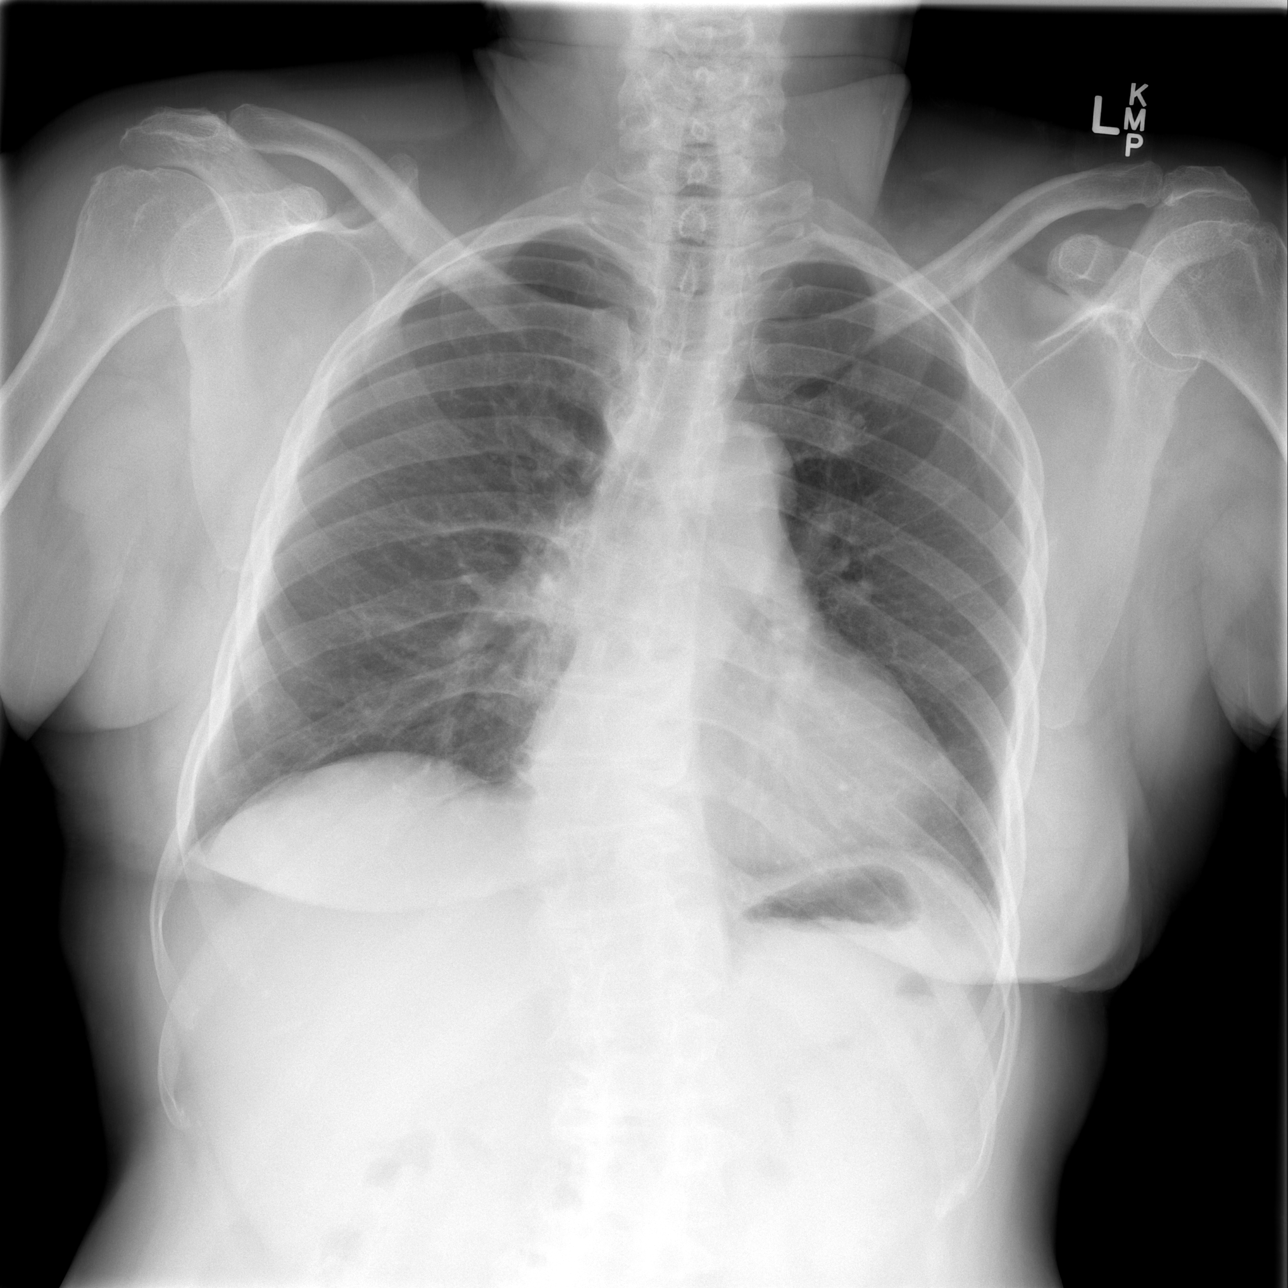

[w abdomen upright *]
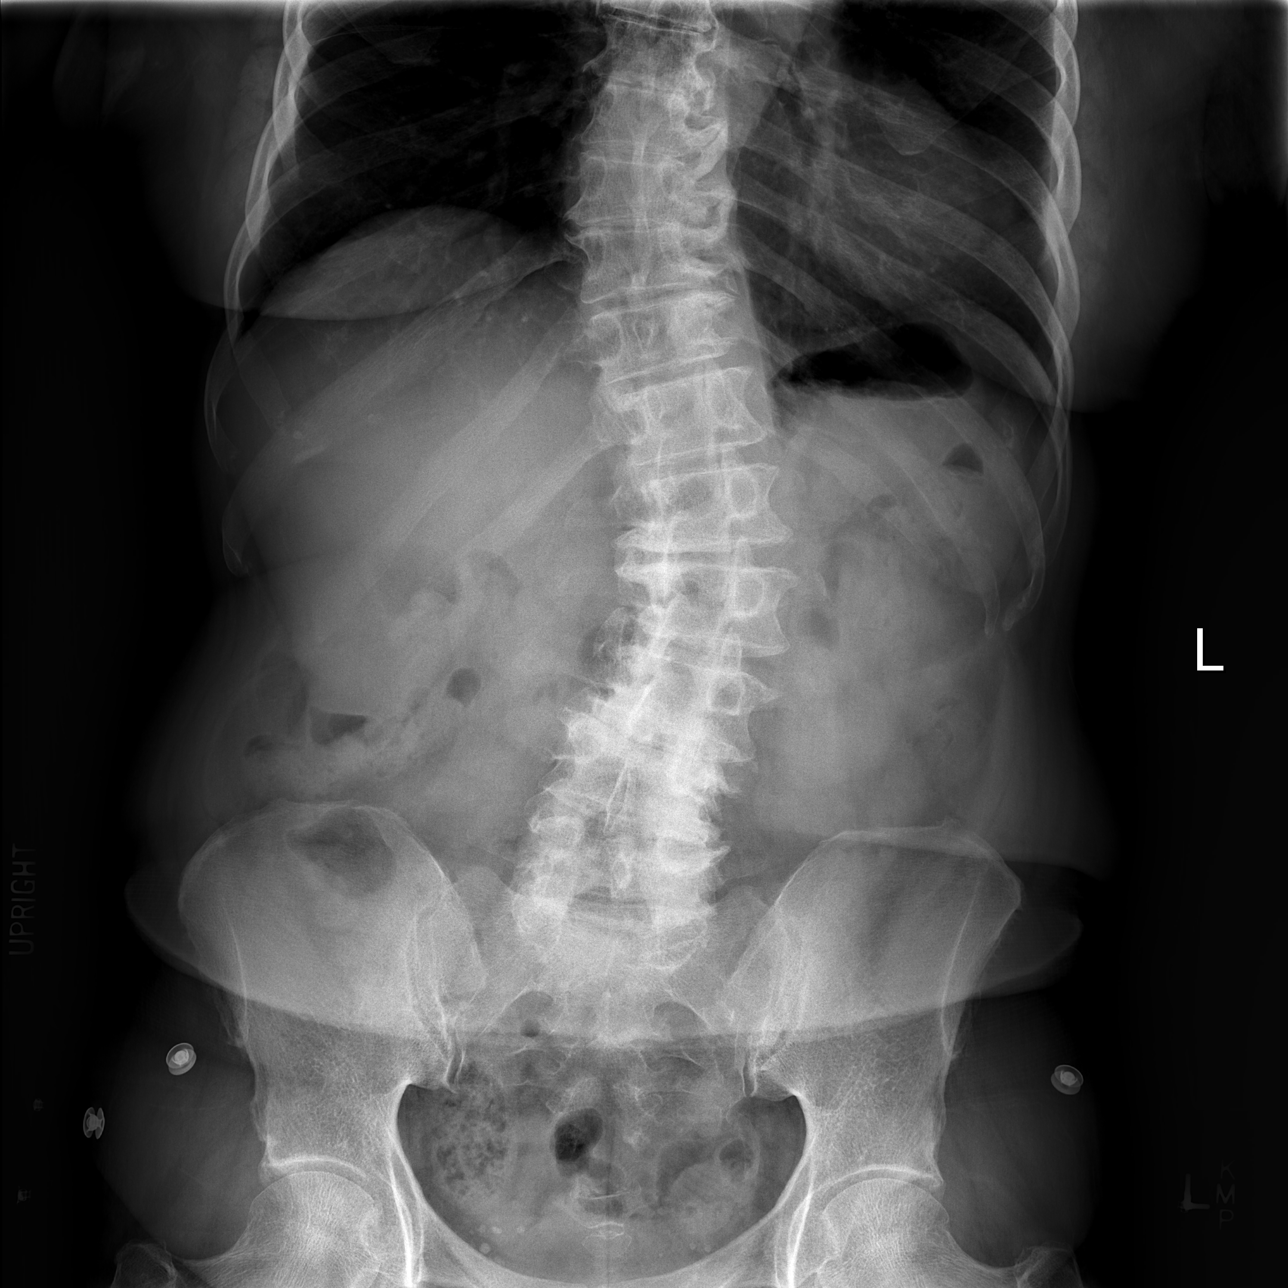

[t abdomen supine]
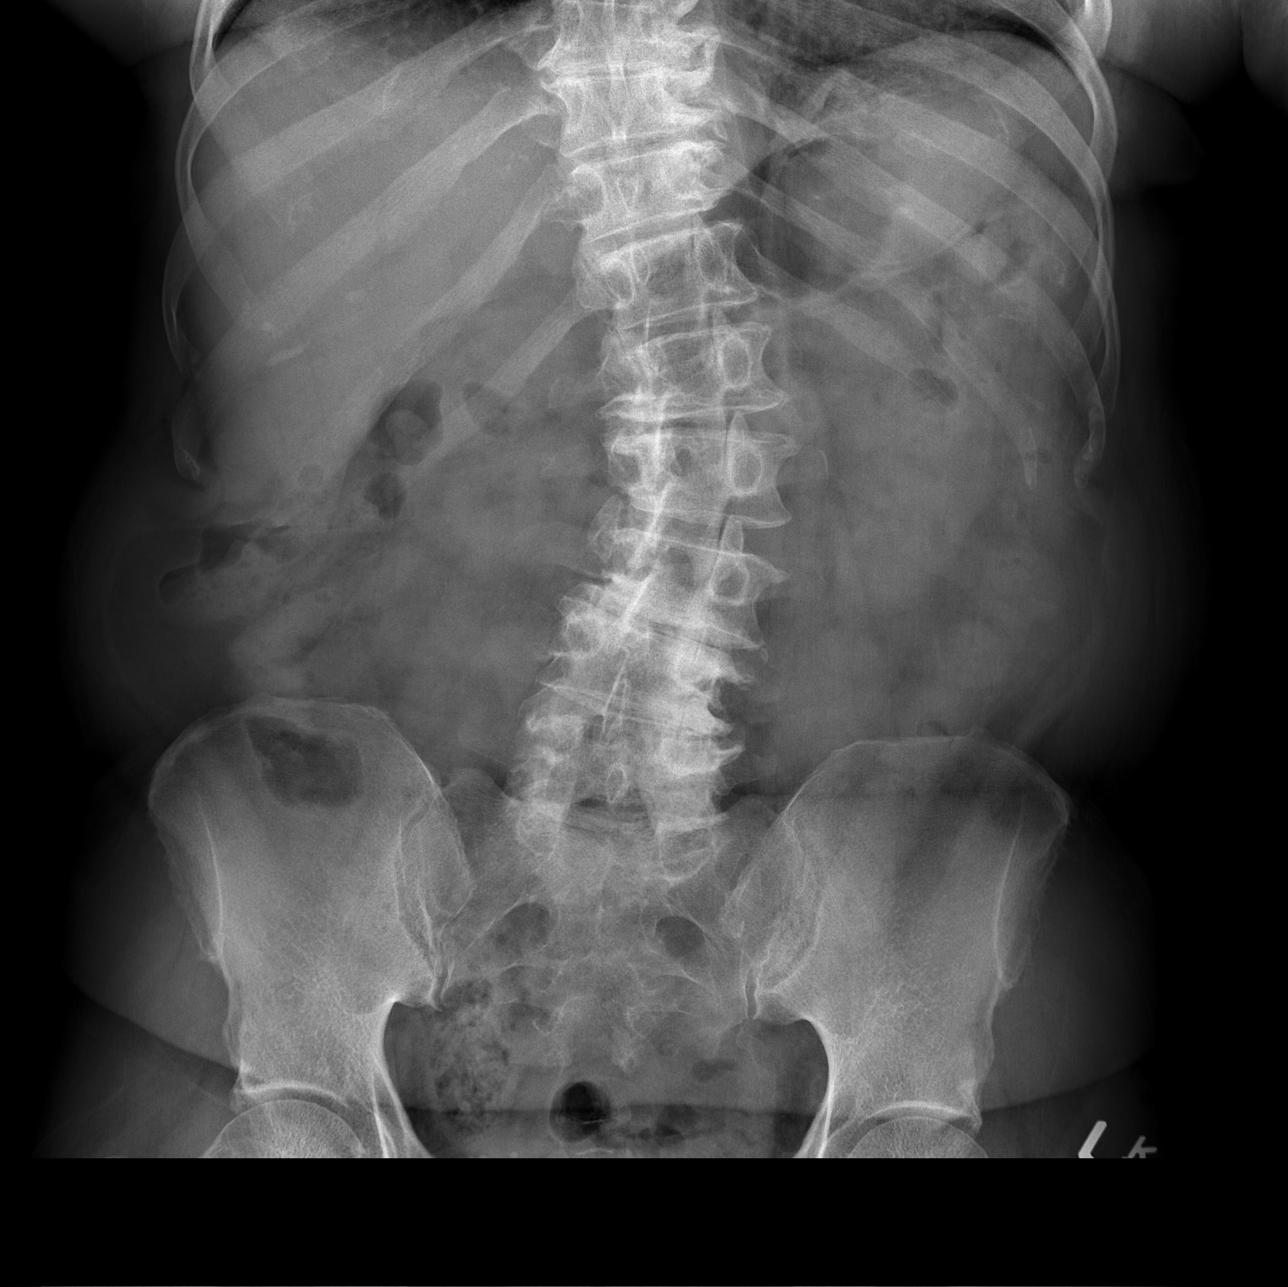

[3 of 3 positions shown; findings below may reference images not displayed]

FINDINGS: The bowel gas pattern is normal. There is no evidence of free
intraperitoneal air. No suspicious radio-opaque calculi or other
significant radiographic abnormality is seen. Heart size and
mediastinal contours are within normal limits. Both lungs are clear.
Thoracolumbar scoliosis. No acute bony abnormality.
IMPRESSION: No acute findings.

## 2016-11-11 DIAGNOSIS — I10 Essential (primary) hypertension: Secondary | ICD-10-CM | POA: Diagnosis not present

## 2016-11-11 DIAGNOSIS — E1165 Type 2 diabetes mellitus with hyperglycemia: Secondary | ICD-10-CM | POA: Diagnosis not present

## 2016-11-11 DIAGNOSIS — E1121 Type 2 diabetes mellitus with diabetic nephropathy: Secondary | ICD-10-CM | POA: Diagnosis not present

## 2016-12-29 DIAGNOSIS — E039 Hypothyroidism, unspecified: Secondary | ICD-10-CM | POA: Diagnosis not present

## 2016-12-29 DIAGNOSIS — N183 Chronic kidney disease, stage 3 (moderate): Secondary | ICD-10-CM | POA: Diagnosis not present

## 2016-12-29 DIAGNOSIS — E1129 Type 2 diabetes mellitus with other diabetic kidney complication: Secondary | ICD-10-CM | POA: Diagnosis not present

## 2016-12-29 DIAGNOSIS — N2581 Secondary hyperparathyroidism of renal origin: Secondary | ICD-10-CM | POA: Diagnosis not present

## 2016-12-29 DIAGNOSIS — E11319 Type 2 diabetes mellitus with unspecified diabetic retinopathy without macular edema: Secondary | ICD-10-CM | POA: Diagnosis not present

## 2016-12-29 DIAGNOSIS — D638 Anemia in other chronic diseases classified elsewhere: Secondary | ICD-10-CM | POA: Diagnosis not present

## 2016-12-29 DIAGNOSIS — E1165 Type 2 diabetes mellitus with hyperglycemia: Secondary | ICD-10-CM | POA: Diagnosis not present

## 2016-12-29 DIAGNOSIS — I129 Hypertensive chronic kidney disease with stage 1 through stage 4 chronic kidney disease, or unspecified chronic kidney disease: Secondary | ICD-10-CM | POA: Diagnosis not present

## 2017-02-09 DIAGNOSIS — Z794 Long term (current) use of insulin: Secondary | ICD-10-CM | POA: Diagnosis not present

## 2017-02-09 DIAGNOSIS — E1122 Type 2 diabetes mellitus with diabetic chronic kidney disease: Secondary | ICD-10-CM | POA: Diagnosis not present

## 2017-02-09 DIAGNOSIS — N183 Chronic kidney disease, stage 3 (moderate): Secondary | ICD-10-CM | POA: Diagnosis not present

## 2017-02-09 DIAGNOSIS — E1165 Type 2 diabetes mellitus with hyperglycemia: Secondary | ICD-10-CM | POA: Diagnosis not present

## 2017-02-09 DIAGNOSIS — E1121 Type 2 diabetes mellitus with diabetic nephropathy: Secondary | ICD-10-CM | POA: Diagnosis not present

## 2017-02-09 DIAGNOSIS — I10 Essential (primary) hypertension: Secondary | ICD-10-CM | POA: Diagnosis not present

## 2017-02-09 DIAGNOSIS — E1142 Type 2 diabetes mellitus with diabetic polyneuropathy: Secondary | ICD-10-CM | POA: Diagnosis not present

## 2017-02-11 DIAGNOSIS — K219 Gastro-esophageal reflux disease without esophagitis: Secondary | ICD-10-CM | POA: Diagnosis not present

## 2017-02-11 DIAGNOSIS — J45909 Unspecified asthma, uncomplicated: Secondary | ICD-10-CM | POA: Diagnosis not present

## 2017-02-11 DIAGNOSIS — R252 Cramp and spasm: Secondary | ICD-10-CM | POA: Diagnosis not present

## 2017-02-11 DIAGNOSIS — Z6827 Body mass index (BMI) 27.0-27.9, adult: Secondary | ICD-10-CM | POA: Diagnosis not present

## 2017-02-14 ENCOUNTER — Other Ambulatory Visit: Payer: Self-pay | Admitting: Family Medicine

## 2017-02-14 ENCOUNTER — Ambulatory Visit
Admission: RE | Admit: 2017-02-14 | Discharge: 2017-02-14 | Disposition: A | Discharge status: Home / Self Care | Payer: Medicare Other | Source: Ambulatory Visit | Attending: Family Medicine | Admitting: Family Medicine

## 2017-02-14 DIAGNOSIS — K219 Gastro-esophageal reflux disease without esophagitis: Secondary | ICD-10-CM | POA: Diagnosis not present

## 2017-02-14 DIAGNOSIS — R0602 Shortness of breath: Secondary | ICD-10-CM

## 2017-02-14 DIAGNOSIS — J45909 Unspecified asthma, uncomplicated: Secondary | ICD-10-CM | POA: Diagnosis not present

## 2017-02-14 DIAGNOSIS — R059 Cough, unspecified: Secondary | ICD-10-CM

## 2017-02-14 DIAGNOSIS — R05 Cough: Secondary | ICD-10-CM | POA: Diagnosis not present

## 2017-02-14 DIAGNOSIS — J309 Allergic rhinitis, unspecified: Secondary | ICD-10-CM | POA: Diagnosis not present

## 2017-02-23 DIAGNOSIS — J45909 Unspecified asthma, uncomplicated: Secondary | ICD-10-CM | POA: Diagnosis not present

## 2017-02-23 DIAGNOSIS — R55 Syncope and collapse: Secondary | ICD-10-CM | POA: Diagnosis not present

## 2017-02-23 DIAGNOSIS — Z6827 Body mass index (BMI) 27.0-27.9, adult: Secondary | ICD-10-CM | POA: Diagnosis not present

## 2017-02-23 DIAGNOSIS — J309 Allergic rhinitis, unspecified: Secondary | ICD-10-CM | POA: Diagnosis not present

## 2017-03-22 DIAGNOSIS — E782 Mixed hyperlipidemia: Secondary | ICD-10-CM | POA: Diagnosis not present

## 2017-03-22 DIAGNOSIS — R944 Abnormal results of kidney function studies: Secondary | ICD-10-CM | POA: Diagnosis not present

## 2017-03-22 DIAGNOSIS — I10 Essential (primary) hypertension: Secondary | ICD-10-CM | POA: Diagnosis not present

## 2017-03-22 DIAGNOSIS — Z79899 Other long term (current) drug therapy: Secondary | ICD-10-CM | POA: Diagnosis not present

## 2017-03-24 DIAGNOSIS — Z0000 Encounter for general adult medical examination without abnormal findings: Secondary | ICD-10-CM | POA: Diagnosis not present

## 2017-03-24 DIAGNOSIS — R944 Abnormal results of kidney function studies: Secondary | ICD-10-CM | POA: Diagnosis not present

## 2017-03-24 DIAGNOSIS — E782 Mixed hyperlipidemia: Secondary | ICD-10-CM | POA: Diagnosis not present

## 2017-03-24 DIAGNOSIS — M25519 Pain in unspecified shoulder: Secondary | ICD-10-CM | POA: Diagnosis not present

## 2017-03-24 DIAGNOSIS — I10 Essential (primary) hypertension: Secondary | ICD-10-CM | POA: Diagnosis not present

## 2017-05-02 DIAGNOSIS — Z79899 Other long term (current) drug therapy: Secondary | ICD-10-CM | POA: Diagnosis not present

## 2017-05-02 DIAGNOSIS — E039 Hypothyroidism, unspecified: Secondary | ICD-10-CM | POA: Diagnosis not present

## 2017-05-05 DIAGNOSIS — R944 Abnormal results of kidney function studies: Secondary | ICD-10-CM | POA: Diagnosis not present

## 2017-05-05 DIAGNOSIS — I10 Essential (primary) hypertension: Secondary | ICD-10-CM | POA: Diagnosis not present

## 2017-05-05 DIAGNOSIS — R7989 Other specified abnormal findings of blood chemistry: Secondary | ICD-10-CM | POA: Diagnosis not present

## 2017-05-05 DIAGNOSIS — F330 Major depressive disorder, recurrent, mild: Secondary | ICD-10-CM | POA: Diagnosis not present

## 2017-05-12 DIAGNOSIS — E1121 Type 2 diabetes mellitus with diabetic nephropathy: Secondary | ICD-10-CM | POA: Diagnosis not present

## 2017-05-12 DIAGNOSIS — E1165 Type 2 diabetes mellitus with hyperglycemia: Secondary | ICD-10-CM | POA: Diagnosis not present

## 2017-05-12 DIAGNOSIS — I10 Essential (primary) hypertension: Secondary | ICD-10-CM | POA: Diagnosis not present

## 2017-05-12 DIAGNOSIS — E1140 Type 2 diabetes mellitus with diabetic neuropathy, unspecified: Secondary | ICD-10-CM | POA: Diagnosis not present

## 2017-08-15 DIAGNOSIS — E1121 Type 2 diabetes mellitus with diabetic nephropathy: Secondary | ICD-10-CM | POA: Diagnosis not present

## 2017-08-15 DIAGNOSIS — E1165 Type 2 diabetes mellitus with hyperglycemia: Secondary | ICD-10-CM | POA: Diagnosis not present

## 2017-08-15 DIAGNOSIS — I10 Essential (primary) hypertension: Secondary | ICD-10-CM | POA: Diagnosis not present

## 2017-09-01 DIAGNOSIS — H43822 Vitreomacular adhesion, left eye: Secondary | ICD-10-CM | POA: Diagnosis not present

## 2017-09-01 DIAGNOSIS — Z961 Presence of intraocular lens: Secondary | ICD-10-CM | POA: Diagnosis not present

## 2017-09-01 DIAGNOSIS — E113393 Type 2 diabetes mellitus with moderate nonproliferative diabetic retinopathy without macular edema, bilateral: Secondary | ICD-10-CM | POA: Diagnosis not present

## 2017-09-01 DIAGNOSIS — H353131 Nonexudative age-related macular degeneration, bilateral, early dry stage: Secondary | ICD-10-CM | POA: Diagnosis not present

## 2017-09-01 DIAGNOSIS — H26493 Other secondary cataract, bilateral: Secondary | ICD-10-CM | POA: Diagnosis not present

## 2017-09-09 DIAGNOSIS — H26493 Other secondary cataract, bilateral: Secondary | ICD-10-CM | POA: Diagnosis not present

## 2017-09-09 DIAGNOSIS — Z961 Presence of intraocular lens: Secondary | ICD-10-CM | POA: Diagnosis not present

## 2017-10-05 DIAGNOSIS — H26491 Other secondary cataract, right eye: Secondary | ICD-10-CM | POA: Diagnosis not present

## 2017-10-05 DIAGNOSIS — Z961 Presence of intraocular lens: Secondary | ICD-10-CM | POA: Diagnosis not present

## 2017-10-10 ENCOUNTER — Emergency Department (HOSPITAL_COMMUNITY): Payer: Medicare Other

## 2017-10-10 ENCOUNTER — Other Ambulatory Visit: Payer: Self-pay

## 2017-10-10 ENCOUNTER — Inpatient Hospital Stay (HOSPITAL_COMMUNITY): Payer: Medicare Other

## 2017-10-10 ENCOUNTER — Observation Stay (HOSPITAL_COMMUNITY)
Admission: EM | Admit: 2017-10-10 | Discharge: 2017-10-11 | Disposition: A | Discharge status: Home / Self Care | Payer: Medicare Other | Attending: Family Medicine | Admitting: Family Medicine

## 2017-10-10 ENCOUNTER — Encounter (HOSPITAL_COMMUNITY): Payer: Self-pay

## 2017-10-10 DIAGNOSIS — R2681 Unsteadiness on feet: Secondary | ICD-10-CM | POA: Diagnosis not present

## 2017-10-10 DIAGNOSIS — G459 Transient cerebral ischemic attack, unspecified: Secondary | ICD-10-CM | POA: Diagnosis not present

## 2017-10-10 DIAGNOSIS — R4701 Aphasia: Secondary | ICD-10-CM | POA: Diagnosis not present

## 2017-10-10 DIAGNOSIS — E785 Hyperlipidemia, unspecified: Secondary | ICD-10-CM | POA: Diagnosis not present

## 2017-10-10 DIAGNOSIS — E1122 Type 2 diabetes mellitus with diabetic chronic kidney disease: Secondary | ICD-10-CM | POA: Insufficient documentation

## 2017-10-10 DIAGNOSIS — I6789 Other cerebrovascular disease: Secondary | ICD-10-CM | POA: Diagnosis not present

## 2017-10-10 DIAGNOSIS — Z79899 Other long term (current) drug therapy: Secondary | ICD-10-CM | POA: Diagnosis not present

## 2017-10-10 DIAGNOSIS — R4781 Slurred speech: Secondary | ICD-10-CM | POA: Diagnosis not present

## 2017-10-10 DIAGNOSIS — Z794 Long term (current) use of insulin: Secondary | ICD-10-CM | POA: Diagnosis not present

## 2017-10-10 DIAGNOSIS — N183 Chronic kidney disease, stage 3 (moderate): Secondary | ICD-10-CM | POA: Insufficient documentation

## 2017-10-10 DIAGNOSIS — I619 Nontraumatic intracerebral hemorrhage, unspecified: Secondary | ICD-10-CM

## 2017-10-10 DIAGNOSIS — E1140 Type 2 diabetes mellitus with diabetic neuropathy, unspecified: Secondary | ICD-10-CM | POA: Insufficient documentation

## 2017-10-10 DIAGNOSIS — F419 Anxiety disorder, unspecified: Secondary | ICD-10-CM | POA: Diagnosis not present

## 2017-10-10 DIAGNOSIS — I129 Hypertensive chronic kidney disease with stage 1 through stage 4 chronic kidney disease, or unspecified chronic kidney disease: Secondary | ICD-10-CM | POA: Insufficient documentation

## 2017-10-10 DIAGNOSIS — G9340 Encephalopathy, unspecified: Secondary | ICD-10-CM

## 2017-10-10 DIAGNOSIS — I609 Nontraumatic subarachnoid hemorrhage, unspecified: Secondary | ICD-10-CM

## 2017-10-10 DIAGNOSIS — R531 Weakness: Secondary | ICD-10-CM | POA: Diagnosis not present

## 2017-10-10 DIAGNOSIS — I639 Cerebral infarction, unspecified: Secondary | ICD-10-CM | POA: Diagnosis present

## 2017-10-10 DIAGNOSIS — I6330 Cerebral infarction due to thrombosis of unspecified cerebral artery: Secondary | ICD-10-CM

## 2017-10-10 DIAGNOSIS — Z8673 Personal history of transient ischemic attack (TIA), and cerebral infarction without residual deficits: Secondary | ICD-10-CM | POA: Diagnosis present

## 2017-10-10 DIAGNOSIS — I6340 Cerebral infarction due to embolism of unspecified cerebral artery: Secondary | ICD-10-CM

## 2017-10-10 DIAGNOSIS — R270 Ataxia, unspecified: Secondary | ICD-10-CM | POA: Diagnosis not present

## 2017-10-10 LAB — URINALYSIS, ROUTINE W REFLEX MICROSCOPIC
BACTERIA UA: NONE SEEN
Bilirubin Urine: NEGATIVE
Glucose, UA: NEGATIVE mg/dL
HGB URINE DIPSTICK: NEGATIVE
Ketones, ur: NEGATIVE mg/dL
NITRITE: NEGATIVE
Protein, ur: NEGATIVE mg/dL
SPECIFIC GRAVITY, URINE: 1.009 (ref 1.005–1.030)
pH: 6 (ref 5.0–8.0)

## 2017-10-10 LAB — COMPREHENSIVE METABOLIC PANEL
ALBUMIN: 4 g/dL (ref 3.5–5.0)
ALK PHOS: 58 U/L (ref 38–126)
ALT: 17 U/L (ref 14–54)
AST: 20 U/L (ref 15–41)
Anion gap: 6 (ref 5–15)
BUN: 22 mg/dL — ABNORMAL HIGH (ref 6–20)
CALCIUM: 9.4 mg/dL (ref 8.9–10.3)
CO2: 26 mmol/L (ref 22–32)
CREATININE: 1.71 mg/dL — AB (ref 0.44–1.00)
Chloride: 103 mmol/L (ref 101–111)
GFR calc Af Amer: 32 mL/min — ABNORMAL LOW (ref >60)
GFR calc non Af Amer: 27 mL/min — ABNORMAL LOW (ref >60)
GLUCOSE: 234 mg/dL — AB (ref 65–99)
Potassium: 4.1 mmol/L (ref 3.5–5.1)
SODIUM: 135 mmol/L (ref 135–145)
Total Bilirubin: 0.8 mg/dL (ref 0.3–1.2)
Total Protein: 7 g/dL (ref 6.5–8.1)

## 2017-10-10 LAB — CBC
HCT: 37.8 % (ref 36.0–46.0)
Hemoglobin: 12.8 g/dL (ref 12.0–15.0)
MCH: 30 pg (ref 26.0–34.0)
MCHC: 33.9 g/dL (ref 30.0–36.0)
MCV: 88.5 fL (ref 78.0–100.0)
PLATELETS: 239 10*3/uL (ref 150–400)
RBC: 4.27 MIL/uL (ref 3.87–5.11)
RDW: 13.6 % (ref 11.5–15.5)
WBC: 6.8 10*3/uL (ref 4.0–10.5)

## 2017-10-10 LAB — DIFFERENTIAL
Basophils Absolute: 0 10*3/uL (ref 0.0–0.1)
Basophils Relative: 1 %
Eosinophils Absolute: 0.1 10*3/uL (ref 0.0–0.7)
Eosinophils Relative: 2 %
Lymphocytes Relative: 34 %
Lymphs Abs: 2.3 10*3/uL (ref 0.7–4.0)
Monocytes Absolute: 0.3 10*3/uL (ref 0.1–1.0)
Monocytes Relative: 5 %
NEUTROS ABS: 4 10*3/uL (ref 1.7–7.7)
NEUTROS PCT: 58 %

## 2017-10-10 LAB — I-STAT CHEM 8, ED
BUN: 25 mg/dL — AB (ref 6–20)
CALCIUM ION: 1.14 mmol/L — AB (ref 1.15–1.40)
CHLORIDE: 102 mmol/L (ref 101–111)
Creatinine, Ser: 1.6 mg/dL — ABNORMAL HIGH (ref 0.44–1.00)
Glucose, Bld: 233 mg/dL — ABNORMAL HIGH (ref 65–99)
HCT: 38 % (ref 36.0–46.0)
Hemoglobin: 12.9 g/dL (ref 12.0–15.0)
Potassium: 4.1 mmol/L (ref 3.5–5.1)
SODIUM: 138 mmol/L (ref 135–145)
TCO2: 25 mmol/L (ref 22–32)

## 2017-10-10 LAB — GLUCOSE, CAPILLARY: Glucose-Capillary: 203 mg/dL — ABNORMAL HIGH (ref 65–99)

## 2017-10-10 LAB — RAPID URINE DRUG SCREEN, HOSP PERFORMED
Amphetamines: NOT DETECTED
Barbiturates: NOT DETECTED
Benzodiazepines: NOT DETECTED
Cocaine: NOT DETECTED
OPIATES: NOT DETECTED
TETRAHYDROCANNABINOL: NOT DETECTED

## 2017-10-10 LAB — APTT: aPTT: 36 seconds (ref 24–36)

## 2017-10-10 LAB — CBG MONITORING, ED: GLUCOSE-CAPILLARY: 229 mg/dL — AB (ref 65–99)

## 2017-10-10 LAB — I-STAT TROPONIN, ED: Troponin i, poc: 0 ng/mL (ref 0.00–0.08)

## 2017-10-10 LAB — PROTIME-INR
INR: 0.99
PROTHROMBIN TIME: 13 s (ref 11.4–15.2)

## 2017-10-10 LAB — ETHANOL: Alcohol, Ethyl (B): <10 mg/dL (ref <10)

## 2017-10-10 MED ORDER — ASPIRIN 325 MG PO TABS
325.0000 mg | ORAL_TABLET | Freq: Every day | ORAL | Status: DC
  Administered 2017-10-10: 325 mg via ORAL
  Filled 2017-10-10 (×2): qty 1

## 2017-10-10 MED ORDER — ACETAMINOPHEN 650 MG RE SUPP: 650.0000 mg | RECTAL | Status: DC | PRN

## 2017-10-10 MED ORDER — INSULIN ASPART 100 UNIT/ML ~~LOC~~ SOLN
0.0000 [IU] | Freq: Three times a day (TID) | SUBCUTANEOUS | Status: DC
  Administered 2017-10-11: 3 [IU] via SUBCUTANEOUS
  Administered 2017-10-11: 5 [IU] via SUBCUTANEOUS

## 2017-10-10 MED ORDER — STROKE: EARLY STAGES OF RECOVERY BOOK
Freq: Once | Status: AC
  Administered 2017-10-10: 22:00:00

## 2017-10-10 MED ORDER — INSULIN DEGLUDEC 200 UNIT/ML ~~LOC~~ SOPN: 40.0000 [IU] | PEN_INJECTOR | Freq: Every day | SUBCUTANEOUS | Status: DC

## 2017-10-10 MED ORDER — HYDRALAZINE HCL 25 MG PO TABS
25.0000 mg | ORAL_TABLET | Freq: Every day | ORAL | Status: DC
  Administered 2017-10-10 – 2017-10-11 (×2): 25 mg via ORAL
  Filled 2017-10-10 (×2): qty 1

## 2017-10-10 MED ORDER — ACETAMINOPHEN 325 MG PO TABS
650.0000 mg | ORAL_TABLET | ORAL | Status: DC | PRN
  Administered 2017-10-10: 650 mg via ORAL
  Filled 2017-10-10: qty 2

## 2017-10-10 MED ORDER — ENOXAPARIN SODIUM 30 MG/0.3ML ~~LOC~~ SOLN
30.0000 mg | SUBCUTANEOUS | Status: DC
Start: 2017-10-10 — End: 2017-10-12
  Administered 2017-10-10: 30 mg via SUBCUTANEOUS
  Filled 2017-10-10 (×2): qty 0.3

## 2017-10-10 MED ORDER — SENNOSIDES-DOCUSATE SODIUM 8.6-50 MG PO TABS
1.0000 | ORAL_TABLET | Freq: Every day | ORAL | Status: DC
  Administered 2017-10-10 – 2017-10-11 (×2): 1 via ORAL
  Filled 2017-10-10 (×2): qty 1

## 2017-10-10 MED ORDER — ASPIRIN 300 MG RE SUPP: 300.0000 mg | Freq: Every day | RECTAL | Status: DC

## 2017-10-10 MED ORDER — INSULIN GLARGINE 100 UNIT/ML ~~LOC~~ SOLN
40.0000 [IU] | Freq: Every day | SUBCUTANEOUS | Status: DC
  Administered 2017-10-10 – 2017-10-11 (×2): 40 [IU] via SUBCUTANEOUS
  Filled 2017-10-10 (×2): qty 0.4

## 2017-10-10 MED ORDER — AMLODIPINE BESYLATE 5 MG PO TABS
5.0000 mg | ORAL_TABLET | Freq: Every day | ORAL | Status: DC
  Administered 2017-10-11: 5 mg via ORAL
  Filled 2017-10-10: qty 1

## 2017-10-10 MED ORDER — ACETAMINOPHEN 160 MG/5ML PO SOLN: 650.0000 mg | ORAL | Status: DC | PRN

## 2017-10-10 MED ORDER — ACETAMINOPHEN 500 MG PO TABS: 1000.0000 mg | ORAL_TABLET | Freq: Four times a day (QID) | ORAL | Status: DC | PRN

## 2017-10-10 MED ORDER — GABAPENTIN 100 MG PO CAPS
100.0000 mg | ORAL_CAPSULE | Freq: Every day | ORAL | Status: DC
  Administered 2017-10-10 – 2017-10-11 (×2): 100 mg via ORAL
  Filled 2017-10-10 (×3): qty 1

## 2017-10-10 MED ORDER — FENOFIBRATE 160 MG PO TABS
160.0000 mg | ORAL_TABLET | Freq: Every morning | ORAL | Status: DC
  Administered 2017-10-11: 160 mg via ORAL
  Filled 2017-10-10: qty 1

## 2017-10-10 MED ORDER — INSULIN ASPART 100 UNIT/ML ~~LOC~~ SOLN
0.0000 [IU] | Freq: Every day | SUBCUTANEOUS | Status: DC
  Administered 2017-10-10: 2 [IU] via SUBCUTANEOUS

## 2017-10-10 MED ORDER — SERTRALINE HCL 50 MG PO TABS
50.0000 mg | ORAL_TABLET | Freq: Every day | ORAL | Status: DC
  Administered 2017-10-10 – 2017-10-11 (×2): 50 mg via ORAL
  Filled 2017-10-10 (×2): qty 1

## 2017-10-10 MED ORDER — ACETAMINOPHEN 325 MG PO TABS
650.0000 mg | ORAL_TABLET | Freq: Once | ORAL | Status: AC
  Administered 2017-10-10: 650 mg via ORAL
  Filled 2017-10-10: qty 2

## 2017-10-10 NOTE — ED Triage Notes (Signed)
Pt was at PCP's office and was unable to sign her name or talk in complete sentences. This occurred at approx 1515. Pt states last normal was 1315 today. Upon EMS arrival Pt exhibited normal mentation and skills however upon being loaded into the ambulance EMS reported expressive aphasia.

## 2017-10-10 NOTE — ED Provider Notes (Signed)
[facility] EMERGENCY DEPARTMENT Provider Note   CSN: [id number] Arrival date & time: 10/10/17  1606   An emergency department physician performed an initial assessment on this suspected stroke patient at 1606.  History   Chief Complaint Chief Complaint  Patient presents with  . Code Stroke    HPI Diana Harper is a 81 y.o. female.  HPI Patient with history of hypertension, hyperlipidemia, insulin-dependent diabetes, CKD comes in with chief complaint of slurred speech, right upper extremity numbness, aphasia.  Patient has no history of strokes.  She reports that she went to her primary care doctor because she was feeling unwell and wanted basic labs checked.  While she was at the clinic she started having difficulty writing her name, and she soon noticed numbness in her right upper extremity over the distal forearm and hands.  Patient then started having slurred speech and difficulty getting words out.  Patient is still stuttering, which is new for her, but all the other symptoms have resolved.  Patient has history of anxiety, but she does not have any history of symptoms similar to the current symptoms as a manifestation of her anxiety.  Past Medical History:  Diagnosis Date  . Anemia   . Anxiety   . Arthritis   . Biliary colic   . Cholelithiasis   . CKD (chronic kidney disease), stage III [facility])    nephrologist--  dr Meredeth Ide  . Diverticulosis of colon   . History of asthma    1980's  no longer problem since 1980's  . History of diverticulitis of colon    recurrent--  2014;  2013;  2012  . Hyperlipidemia   . Hypertension   . Insulin dependent type 2 diabetes mellitus ([place])    followed by dr Jenny Reichmann lambeth ([facility])   . Murmur   . Peripheral neuropathy   . Vitamin D deficiency   . Wears hearing aid    bilateral  . Wears partial dentures    lower partial and upper full    Patient Active Problem List   Diagnosis Date Noted  . Cerebral thrombosis with  cerebral infarction 10/10/2017  . Cerebral embolism with cerebral infarction 10/10/2017  . Subarachnoid hemorrhage 10/10/2017  . Intracerebral hemorrhage 10/10/2017  . Chronic cholecystitis with calculus 08/25/2016  . Syncope 11/15/2011  . Diverticulitis 11/15/2011    Past Surgical History:  Procedure Laterality Date  . ABDOMINAL HYSTERECTOMY  1970's  . CARDIOVASCULAR STRESS TEST  07/30/2009   normal exercise lexiscan nuclear study w/ no ischemia/  normal LV funciton and wall motion, ef 77%  . CATARACT EXTRACTION W/ INTRAOCULAR LENS  IMPLANT, BILATERAL  2014  . LUMBAR SPINE SURGERY  2014  . REMOVAL AXILLA CYST Right 1990's    OB History    No data available       Home Medications    Prior to Admission medications   Medication Sig Start Date End Date Taking? Authorizing Provider  amLODipine (NORVASC) 5 MG tablet Take 5 mg daily with breakfast by mouth.    Yes [provider]  losartan-hydrochlorothiazide (HYZAAR) 100-25 MG tablet Take 1 tablet by mouth daily with breakfast.    Yes [provider]  acetaminophen (TYLENOL) 500 MG tablet Take 1,000 mg by mouth every 6 (six) hours as needed for mild pain, moderate pain, fever or headache.    [provider]  Calcium Carbonate-Vitamin D (CALTRATE 600+D PO) Take 1 tablet by mouth daily with breakfast.     [provider]  Cholecalciferol (VITAMIN D3) 5000 units TABS Take 1 tablet by mouth daily with breakfast.    [provider]  Dulaglutide (TRULICITY) 1.5 FX/9.0WI SOPN Inject 1.5 mg into the skin every Friday.    [provider]  fenofibrate 160 MG tablet Take 160 mg by mouth every morning.     [provider]  gabapentin (NEURONTIN) 100 MG capsule Take 200 mg by mouth daily with breakfast.     [provider]  hydrALAZINE (APRESOLINE) 25 MG tablet Take 25 mg by mouth 2 (two) times daily.    [provider]  HYDROcodone-acetaminophen (NORCO/VICODIN)  5-325 MG tablet Take 1-2 tablets by mouth every 6 (six) hours as needed for moderate pain. 08/25/16   Kinsinger, Arta Bruce, MD  insulin regular human CONCENTRATED (HUMULIN R) 500 UNIT/ML injection Inject 40-45 Units into the skin 2 (two) times daily with a meal. Takes 45u at 1100 am and 40u at 2000 pm    [provider]  loratadine (CLARITIN) 10 MG tablet Take 10 mg by mouth daily as needed for allergies.     [provider]  Multiple Vitamins-Minerals (HAIR/SKIN/NAILS) CAPS Take 1 tablet by mouth every morning.    [provider]  Multiple Vitamins-Minerals (PRESERVISION/LUTEIN) CAPS Take 1 capsule by mouth daily with breakfast.     [provider]  sertraline (ZOLOFT) 50 MG tablet Take 50 mg by mouth every morning.    [provider]    Family History No family history on file.  Social History Social History   Tobacco Use  . Smoking status: Never Smoker  . Smokeless tobacco: Never Used  Substance Use Topics  . Alcohol use: No  . Drug use: No     Allergies   Latex; Onion; Asa [aspirin]; Caffeine; Lisinopril; Metformin and related; Simvastatin; Tetracycline; and Zithromax [azithromycin]   Review of Systems Review of Systems  Constitutional: Positive for activity change.  Respiratory: Negative for shortness of breath.   Cardiovascular: Negative for chest pain.  Gastrointestinal: Negative for abdominal pain.  Neurological: Positive for tremors, speech difficulty and numbness.  All other systems reviewed and are negative.    Physical Exam Updated Vital Signs BP (!) 157/77   Pulse 84   Temp 98.1 F (36.7 C)   Resp (!) 22   Ht 5\' 3"  (1.6 m)   Wt 72.4 kg (159 lb 9.8 oz)   SpO2 100%   BMI 28.27 kg/m   Physical Exam  Constitutional: She is oriented to person, place, and time. She appears well-developed.  HENT:  Head: Normocephalic and atraumatic.  Eyes: EOM are normal. Pupils are equal, round, and reactive to light.  Neck:  Normal range of motion. Neck supple.  Cardiovascular: Normal rate.  Pulmonary/Chest: Effort normal.  Abdominal: Bowel sounds are normal.  Neurological: She is alert and oriented to person, place, and time. No cranial nerve deficit. Coordination normal.  Stuttering of speech  Skin: Skin is warm and dry.  Nursing note and vitals reviewed.    ED Treatments / Results  Labs (all labs ordered are listed, but only abnormal results are displayed) Labs Reviewed  COMPREHENSIVE METABOLIC PANEL - Abnormal; Notable for the following components:      Result Value   Glucose, Bld 234 (*)    BUN 22 (*)    Creatinine, Ser 1.71 (*)    GFR calc non Af Amer 27 (*)    GFR calc Af Amer 32 (*)    All other components within normal limits  I-STAT CHEM 8, ED - Abnormal; Notable for the following components:   BUN 25 (*)    Creatinine, Ser 1.60 (*)    Glucose, Bld 233 (*)    Calcium, Ion 1.14 (*)    All other components within normal limits  CBG MONITORING, ED - Abnormal; Notable for the following components:   Glucose-Capillary 229 (*)    All other components within normal limits  ETHANOL  PROTIME-INR  APTT  CBC  DIFFERENTIAL  RAPID URINE DRUG SCREEN, HOSP PERFORMED  URINALYSIS, ROUTINE W REFLEX MICROSCOPIC  I-STAT TROPONIN, ED    EKG  EKG Interpretation  Date/Time:  Monday October 10 2017 16:28:37 EST Ventricular Rate:  84 PR Interval:    QRS Duration: 87 QT Interval:  370 QTC Calculation: 438 R Axis:   23 Text Interpretation:  Sinus rhythm Low voltage, precordial leads Anteroseptal infarct, old No acute changes No significant change since last tracing Confirmed by Varney Biles [phone]) on 10/10/2017 5:52:31 PM       Radiology Ct Head Code Stroke Wo Contrast  Result Date: 10/10/2017 CLINICAL DATA:  Code stroke.  Aphasia EXAM: CT HEAD WITHOUT CONTRAST TECHNIQUE: Contiguous axial images were obtained from the base of the skull through the vertex without intravenous contrast.  COMPARISON:  Head CT 04/10/2012 FINDINGS: Brain: No mass lesion or acute hemorrhage. No focal hypoattenuation of the basal ganglia or cortex to indicate infarcted tissue. There is periventricular hypoattenuation compatible with chronic microvascular disease. No hydrocephalus or extra-axial collection. Vascular: No hyperdense vessel. No advanced atherosclerotic calcification of the arteries at the skull base. Skull: Normal visualized skull base, calvarium and extracranial soft tissues. Sinuses/Orbits: No sinus fluid levels or advanced mucosal thickening. No mastoid effusion. Normal orbits. ASPECTS [facility] Stroke Program Early CT Score) - Ganglionic level infarction (caudate, lentiform nuclei, internal capsule, insula, M1-M3 cortex): 7 - Supraganglionic infarction (M4-M6 cortex): 3 Total score (0-10 with 10 being normal): 10 IMPRESSION: 1. No hemorrhage or mass effect. 2. Findings of chronic ischemic microangiopathy without acute abnormality. 3. ASPECTS is 10. These results were called by telephone at the time of interpretation on 10/10/2017 at 4:21 pm to Dr. Roland Rack who verbally acknowledged these results. Electronically Signed   By: Ulyses Jarred M.D.   On: 10/10/2017 16:22    Procedures Procedures (including critical care time)  Medications Ordered in ED Medications - No data to display   Initial Impression / Assessment and Plan / ED Course  I have reviewed the triage vital signs and the nursing notes.  Pertinent labs & imaging results that were available during my care of the patient were reviewed by me and considered in my medical decision making (see chart for details).     Patient comes in to the ER via EMS from her PCP clinic as code stroke. By the time patient arrived to the ER she has stuttering, all the other symptoms have resolved.  Patient has multiple risk factors for stroke therefore stroke/TIA considered high in the differential diagnosis.  Patient also has a history of  anxiety, and the symptoms could have been manifestation of anxiety.  Neuro team has evaluated the patient.  They to reach the same conclusion of stroke/TIA versus anxiety, and given the significant risk factors that patient has they would want her to be admitted to the hospital for TIA workup.  Results from the ER workup discussed with the patient face to face and all questions answered to the best of my ability.   Final Clinical Impressions(s) /  ED Diagnoses   Final diagnoses:  TIA (transient ischemic attack)  Slurred speech    ED Discharge Orders    None       Varney Biles, MD 10/10/17 1758

## 2017-10-10 NOTE — Code Documentation (Signed)
81yo female arriving to [facility] via [facility] at 36.  Patient went to the doctor's office where she had difficulty signing in at 1515.  Patient apparently had difficulty earlier today that resolved but reports she was LKW at 1315 when she took a shower.  Patient with h/o TIA.  Stroke team at the bedside on patient arrival.  Labs drawn and patient to CT with team.  CT completed. NIHSS 1, see documentation for details and code stroke times.  Patient with right arm and leg decreased sensation on exam.  Patient with stuttering speech at times as well as tremor which patient reports is new.  No acute stroke treatment at this time per MD.  Code stroke canceled.  Bedside handoff with ED RNs Larkin Ina and Lennette Bihari.

## 2017-10-10 NOTE — ED Notes (Addendum)
Family at bedside.Pt complains of a headache 4/10 but states "I feel better than I did before I got here"

## 2017-10-10 NOTE — Progress Notes (Signed)
Diana Harper VQM:[id number] DOB: [birth year] DOA: 10/10/2017  Referring physician: ED PCP: Saralyn Pilar, MD  Specialists: Neurology  Chief Complaint:   HPI:   81 year old female HTN Diabetes mellitus type 2 on insulin with neuropathy Osteoporosis stg III ckd seen by Nephro Prior diverticulitis Prior admission for syncope 11/15/2011 Prior hospital admission for lap chole 07/2017  Was at the primary care physician office earlier today and in the process of checking in episodic difficulty writing with the right hand and some a aphasia noted this is at around 315 this lasted until EMS tracks were called in Patient did not have any slurred speech but was unable to speak as above She states that her right hand felt numb as well and this is happened before the past couple months In the past 6 months she has had 4 syncopal episodes, 3 of them have been on the toilet while passing stool-the last 1 was not provoked by any positional changes or any orthostatic changes and was about a month ago she did not receive workup for the same There was no seizure-like activity, there was no tongue biting or loss of continence In the past when she has had syncopal episodes they usually last for about 4 to 5 minutes  She has had no other illnesses recently is eating and drinking okay normally no chest pain no shortness of breath no other findings She has not had any ill contact exposure Emergency room workup showed BUN/creatinine 25/1.6 potassium 4.1 hemoglobin 12.9 EKG was done showing normal sinus rhythm QRS axis XX 3 PR interval 0.02 no ST-T wave changes although wandering baseline based on EKG chest x-ray two-view did not show any findings   Review of Systems:    Past Medical History:  Diagnosis Date  . Anemia   . Anxiety   . Arthritis   . Biliary colic   . Cholelithiasis   . CKD (chronic kidney disease), stage III [facility])    nephrologist--  dr Meredeth Ide  . Diverticulosis of  colon   . History of asthma    1980's  no longer problem since 1980's  . History of diverticulitis of colon    recurrent--  2014;  2013;  2012  . Hyperlipidemia   . Hypertension   . Insulin dependent type 2 diabetes mellitus ([place])    followed by dr Jenny Reichmann lambeth ([facility])   . Murmur   . Peripheral neuropathy   . Vitamin D deficiency   . Wears hearing aid    bilateral  . Wears partial dentures    lower partial and upper full   Past Surgical History:  Procedure Laterality Date  . ABDOMINAL HYSTERECTOMY  1970's  . CARDIOVASCULAR STRESS TEST  07/30/2009   normal exercise lexiscan nuclear study w/ no ischemia/  normal LV funciton and wall motion, ef 77%  . CATARACT EXTRACTION W/ INTRAOCULAR LENS  IMPLANT, BILATERAL  2014  . LUMBAR SPINE SURGERY  2014  . REMOVAL AXILLA CYST Right 1990's   Social History:  reports that  has never smoked. she has never used smokeless tobacco. She reports that she does not drink alcohol or use drugs. Never smoker never drinker  Allergies  Allergen Reactions  . Latex Anaphylaxis and Shortness Of Breath    : severe respiratory distress  . Onion Diarrhea  . Asa [Aspirin] Other (See Comments)    Avoids due to hx gastric ulcer  . Caffeine Other (See Comments)    Stomach pain from pepsi  .  Lisinopril Cough  . Metformin And Related Diarrhea  . Simvastatin Rash  . Tetracycline Rash  . Zithromax [Azithromycin] Rash    No family history on file. Mother still alive lives with the patient and the patient lives with her daughter in addition Mother has diabetes mellitus Father died of acute renal failure Used to work as a Banker in the past Prior to Admission medications   Medication Sig Start Date End Date Taking? Authorizing Provider  amLODipine (NORVASC) 5 MG tablet Take 5 mg daily with breakfast by mouth.    Yes [provider]  losartan-hydrochlorothiazide (HYZAAR) 100-25 MG tablet Take 1 tablet by mouth daily with breakfast.     Yes [provider]  acetaminophen (TYLENOL) 500 MG tablet Take 1,000 mg by mouth every 6 (six) hours as needed for mild pain, moderate pain, fever or headache.    [provider]  Calcium Carbonate-Vitamin D (CALTRATE 600+D PO) Take 1 tablet by mouth daily with breakfast.     [provider]  Cholecalciferol (VITAMIN D3) 5000 units TABS Take 1 tablet by mouth daily with breakfast.    [provider]  Dulaglutide (TRULICITY) 1.5 XB/1.4NW SOPN Inject 1.5 mg into the skin every Friday.    [provider]  fenofibrate 160 MG tablet Take 160 mg by mouth every morning.     [provider]  gabapentin (NEURONTIN) 100 MG capsule Take 200 mg by mouth daily with breakfast.     [provider]  hydrALAZINE (APRESOLINE) 25 MG tablet Take 25 mg by mouth 2 (two) times daily.    [provider]  HYDROcodone-acetaminophen (NORCO/VICODIN) 5-325 MG tablet Take 1-2 tablets by mouth every 6 (six) hours as needed for moderate pain. 08/25/16   Kinsinger, Arta Bruce, MD  insulin regular human CONCENTRATED (HUMULIN R) 500 UNIT/ML injection Inject 40-45 Units into the skin 2 (two) times daily with a meal. Takes 45u at 1100 am and 40u at 2000 pm    [provider]  loratadine (CLARITIN) 10 MG tablet Take 10 mg by mouth daily as needed for allergies.     [provider]  Multiple Vitamins-Minerals (HAIR/SKIN/NAILS) CAPS Take 1 tablet by mouth every morning.    [provider]  Multiple Vitamins-Minerals (PRESERVISION/LUTEIN) CAPS Take 1 capsule by mouth daily with breakfast.     [provider]  sertraline (ZOLOFT) 50 MG tablet Take 50 mg by mouth every morning.    [provider]   Physical Exam: Vitals:   10/10/17 1700 10/10/17 1715  BP: (!) 145/77 (!) 157/77  Pulse: 89 84  Resp: 20 (!) 22  Temp:    SpO2: 100% 100%    Alert oriented alopecia No icterus no pallor extraocular movements intact smile  symmetric uvula midline neck soft supple finger-nose-finger test is within normal limits power is 5/5 sensory is grossly intact Chest is clinically clear without added sound Diminished soft nontender no rebound no guarding Plantars are downgoing Lower extremity power is 5/5 able to resist gravity with force    Labs on Admission:  Basic Metabolic Panel: Recent Labs  Lab 10/10/17 1605 10/10/17 1618  NA 135 138  K 4.1 4.1  CL 103 102  CO2 26  --   GLUCOSE 234* 233*  BUN 22* 25*  CREATININE 1.71* 1.60*  CALCIUM 9.4  --    Liver Function Tests: Recent Labs  Lab 10/10/17 1605  AST 20  ALT 17  ALKPHOS 58  BILITOT 0.8  PROT  7.0  ALBUMIN 4.0   No results for input(s): LIPASE, AMYLASE in the last 168 hours. No results for input(s): AMMONIA in the last 168 hours. CBC: Recent Labs  Lab 10/10/17 1605 10/10/17 1618  WBC 6.8  --   NEUTROABS 4.0  --   HGB 12.8 12.9  HCT 37.8 38.0  MCV 88.5  --   PLT 239  --    Cardiac Enzymes: No results for input(s): CKTOTAL, CKMB, CKMBINDEX, TROPONINI in the last 168 hours.  BNP (last 3 results) No results for input(s): BNP in the last 8760 hours.  ProBNP (last 3 results) No results for input(s): PROBNP in the last 8760 hours.  CBG: Recent Labs  Lab 10/10/17 1609  GLUCAP 229*    Radiological Exams on Admission: Ct Head Code Stroke Wo Contrast  Result Date: 10/10/2017 CLINICAL DATA:  Code stroke.  Aphasia EXAM: CT HEAD WITHOUT CONTRAST TECHNIQUE: Contiguous axial images were obtained from the base of the skull through the vertex without intravenous contrast. COMPARISON:  Head CT 04/10/2012 FINDINGS: Brain: No mass lesion or acute hemorrhage. No focal hypoattenuation of the basal ganglia or cortex to indicate infarcted tissue. There is periventricular hypoattenuation compatible with chronic microvascular disease. No hydrocephalus or extra-axial collection. Vascular: No hyperdense vessel. No advanced atherosclerotic calcification of  the arteries at the skull base. Skull: Normal visualized skull base, calvarium and extracranial soft tissues. Sinuses/Orbits: No sinus fluid levels or advanced mucosal thickening. No mastoid effusion. Normal orbits. ASPECTS [facility] Stroke Program Early CT Score) - Ganglionic level infarction (caudate, lentiform nuclei, internal capsule, insula, M1-M3 cortex): 7 - Supraganglionic infarction (M4-M6 cortex): 3 Total score (0-10 with 10 being normal): 10 IMPRESSION: 1. No hemorrhage or mass effect. 2. Findings of chronic ischemic microangiopathy without acute abnormality. 3. ASPECTS is 10. These results were called by telephone at the time of interpretation on 10/10/2017 at 4:21 pm to Dr. Roland Rack who verbally acknowledged these results. Electronically Signed   By: Ulyses Jarred M.D.   On: 10/10/2017 16:22    EKG: Independently reviewed.  As above  Assessment/Plan Active Problems:   Cerebral thrombosis with cerebral infarction   Cerebral embolism with cerebral infarction   Subarachnoid hemorrhage   Intracerebral hemorrhage   Possible TIA versus small stroke Placed on TIA pathway get MRI of the brain and further workup as determined by that For now feel reasonable to continue amlodipine 5 As CT head shows no hemorrhage or mass-effect but chronic ischemic microangiopathy, feel reasonable to continue aspirin that was started by emergency room 325 mg She has passed swallow eval and will need therapy evaluations as an outpatient  Hyperlipidemia continue fenofibrate 160 a.m.  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 Probably need to discontinue Hyzaar as has HCTZ has component and would discontinue at this time  Hypertension for now can control with amlodipine 5  Bipolar Continue sertraline 50 daily  Full  code Lovenox Inpatient     Time spent: [place], [facility] Pager [phone]  If 7PM-7AM, please contact night-coverage [email or web address] Password [facility] 10/10/2017, 5:45 PM

## 2017-10-10 NOTE — ED Notes (Signed)
ED Provider at bedside. 

## 2017-10-10 NOTE — Consult Note (Addendum)
Requesting Physician: ED MD    Chief Complaint: aphasia  History obtained from:  Patient     HPI:                                                                                                                                         Diana Harper is an 81 y.o. female presenting to the ED after noted to be normal at [address].  She went to her PCP and was noted to have expressive difficulties.  She states that she went to use her right hand to sign in, and the right arm was not working properly.  She states that she could not make it do what it was supposed to do.  Currently she still has some slow and methodical speech and appears anxious. Currently no other findings have been noted.   She currently feels like she is essentially back to her baseline.  Date last known well: Date: 10/10/2017 Time last known well: Time: 13:15 tPA Given: No: minimal symptoms   Modified Rankin: Rankin Score=0   Past Medical History:  Diagnosis Date  . Anemia   . Anxiety   . Arthritis   . Biliary colic   . Cholelithiasis   . CKD (chronic kidney disease), stage III    nephrologist--  dr Meredeth Ide  . Diverticulosis of colon   . History of asthma    1980's  no longer problem since 1980's  . History of diverticulitis of colon    recurrent--  2014;  2013;  2012  . Hyperlipidemia   . Hypertension   . Insulin dependent type 2 diabetes mellitus ([place])    followed by dr Jenny Reichmann lambeth ([facility])   . Murmur   . Peripheral neuropathy ([place])   . Vitamin D deficiency   . Wears hearing aid    bilateral  . Wears partial dentures    lower partial and upper full    Past Surgical History:  Procedure Laterality Date  . ABDOMINAL HYSTERECTOMY  1970's  . CARDIOVASCULAR STRESS TEST  07/30/2009   normal exercise lexiscan nuclear study w/ no ischemia/  normal LV funciton and wall motion, ef 77%  . CATARACT EXTRACTION W/ INTRAOCULAR LENS  IMPLANT, BILATERAL  2014  . LUMBAR SPINE SURGERY  2014  . REMOVAL AXILLA CYST  Right 1990's    No family history on file. Social History:  reports that  has never smoked. she has never used smokeless tobacco. She reports that she does not drink alcohol or use drugs.  Allergies:  Allergies  Allergen Reactions  . Latex Anaphylaxis and Shortness Of Breath    : severe respiratory distress  . Onion Diarrhea  . Asa [Aspirin] Other (See Comments)    Avoids due to hx gastric ulcer  . Lisinopril Cough  . Metformin And Related Diarrhea  . Simvastatin Rash  . Tetracycline Rash  . Zithromax [Azithromycin] Rash  Medications:                                                                                                                          No current facility-administered medications for this encounter.    Current Outpatient Medications  Medication Sig Dispense Refill  . acetaminophen (TYLENOL) 500 MG tablet Take 1,000 mg by mouth every 6 (six) hours as needed for mild pain, moderate pain, fever or headache.    [name] amLODipine (NORVASC) 5 MG tablet Take 10 mg by mouth daily with breakfast.     . Calcium Carbonate-Vitamin D (CALTRATE 600+D PO) Take 1 tablet by mouth daily with breakfast.     . Cholecalciferol (VITAMIN D3) 5000 units TABS Take 1 tablet by mouth daily with breakfast.    . Dulaglutide (TRULICITY) 1.5 IR/4.4RX SOPN Inject 1.5 mg into the skin every Friday.    . fenofibrate 160 MG tablet Take 160 mg by mouth every morning.     . gabapentin (NEURONTIN) 100 MG capsule Take 200 mg by mouth daily with breakfast.     . hydrALAZINE (APRESOLINE) 25 MG tablet Take 25 mg by mouth 2 (two) times daily.    [name] HYDROcodone-acetaminophen (NORCO/VICODIN) 5-325 MG tablet Take 1-2 tablets by mouth every 6 (six) hours as needed for moderate pain. 30 tablet 0  . insulin regular human CONCENTRATED (HUMULIN R) 500 UNIT/ML injection Inject 40-45 Units into the skin 2 (two) times daily with a meal. Takes 45u at 1100 am and 40u at 2000 pm    . loratadine (CLARITIN) 10 MG tablet Take  10 mg by mouth daily as needed for allergies.     [name] losartan-hydrochlorothiazide (HYZAAR) 100-25 MG tablet Take 1 tablet by mouth daily with breakfast.     . Multiple Vitamins-Minerals (HAIR/SKIN/NAILS) CAPS Take 1 tablet by mouth every morning.    . Multiple Vitamins-Minerals (PRESERVISION/LUTEIN) CAPS Take 1 capsule by mouth daily with breakfast.     . sertraline (ZOLOFT) 50 MG tablet Take 50 mg by mouth every morning.       ROS:                                                                                                                                       History obtained from the patient  General ROS: negative for - chills, fatigue, fever, night sweats, weight gain or weight loss Psychological  ROS: negative for - behavioral disorder, hallucinations, memory difficulties, mood swings or suicidal ideation Ophthalmic ROS: negative for - blurry vision, double vision, eye pain or loss of vision ENT ROS: negative for - epistaxis, nasal discharge, oral lesions, sore throat, tinnitus or vertigo Allergy and Immunology ROS: negative for - hives or itchy/watery eyes Hematological and Lymphatic ROS: negative for - bleeding problems, bruising or swollen lymph nodes Endocrine ROS: negative for - galactorrhea, hair pattern changes, polydipsia/polyuria or temperature intolerance Respiratory ROS: negative for - cough, hemoptysis, shortness of breath or wheezing Cardiovascular ROS: negative for - chest pain, dyspnea on exertion, edema or irregular heartbeat Gastrointestinal ROS: negative for - abdominal pain, diarrhea, hematemesis, nausea/vomiting or stool incontinence Genito-Urinary ROS: negative for - dysuria, hematuria, incontinence or urinary frequency/urgency Musculoskeletal ROS: negative for - joint swelling or muscular weakness Neurological ROS: as noted in HPI Dermatological ROS: negative for rash and skin lesion changes  Neurologic Examination:                                                                                                       There were no vitals taken for this visit.  HEENT-  Normocephalic, no lesions, without obvious abnormality.  Normal external eye and conjunctiva.  Normal TM's bilaterally.  Normal auditory canals and external ears. Normal external nose, mucus membranes and septum.  Normal pharynx. Cardiovascular- S1, S2 normal, pulses palpable throughout   Lungs- chest clear, no wheezing, rales, normal symmetric air entry Abdomen- normal findings: bowel sounds normal Extremities- no edema Lymph-no adenopathy palpable Musculoskeletal-no joint tenderness, deformity or swelling Skin-warm and dry, no hyperpigmentation, vitiligo, or suspicious lesions  Neurological Examination Mental Status: Alert, oriented, thought content appropriate.  Speech fluent with hesitation without evidence of aphasia.  Difficulty  Doing multiple step commands at once.  Cranial Nerves: II: Discs flat bilaterally; Visual fields grossly normal,  III,IV, VI: ptosis not present, extra-ocular motions intact bilaterally, pupils equal, round, reactive to light and accommodation V,VII: smile symmetric, facial light touch sensation normal bilaterally VIII: hearing normal bilaterally IX,X: uvula rises symmetrically XI: bilateral shoulder shrug XII: midline tongue extension Motor: Right : Upper extremity   5/5    Left:     Upper extremity   5/5  Lower extremity   5/5     Lower extremity   5/5 Tone and bulk:normal tone throughout; no atrophy noted She has a diffuse tremor which appears most consistent with a markedly enhanced physiologic tremor, she does not perform distraction well enough to tell if it is a distractible tremor or not. Sensory: decreased sensation on right arm and leg Deep Tendon Reflexes: 1+ and symmetric throughout but will not relax Plantars: Right: downgoing   Left: downgoing Cerebellar: normal finger-to-nose--but done with jerky motions  and normal heel-to-shin  test Gait: not tested       Lab Results: Basic Metabolic Panel: No results for input(s): NA, K, CL, CO2, GLUCOSE, BUN, CREATININE, CALCIUM, MG, PHOS in the last 168 hours.  Liver Function Tests: No results for input(s): AST, ALT, ALKPHOS, BILITOT, PROT, ALBUMIN in the last 168  hours. No results for input(s): LIPASE, AMYLASE in the last 168 hours. No results for input(s): AMMONIA in the last 168 hours.  CBC: No results for input(s): WBC, NEUTROABS, HGB, HCT, MCV, PLT in the last 168 hours.  Cardiac Enzymes: No results for input(s): CKTOTAL, CKMB, CKMBINDEX, TROPONINI in the last 168 hours.  Lipid Panel: No results for input(s): CHOL, TRIG, HDL, CHOLHDL, VLDL, LDLCALC in the last 168 hours.  CBG: No results for input(s): GLUCAP in the last 168 hours.  Microbiology: Results for orders placed or performed during the hospital encounter of 11/14/11  Urine culture     Status: None   Collection Time: 11/14/11 11:32 PM  Result Value Ref Range Status   Specimen Description URINE, RANDOM  Final   Special Requests NONE  Final   Culture  Setup Time [id number]  Final   Colony Count NO GROWTH  Final   Culture NO GROWTH  Final   Report Status 11/16/2011 FINAL  Final    Coagulation Studies: No results for input(s): LABPROT, INR in the last 72 hours.  Imaging: No results found.     Assessment and plan discussed with with attending physician and they are in agreement.    Etta Quill PA-C [facility] [phone]  10/10/2017, 4:09 PM   Assessment: 81 y.o. female with transient right arm dysfunction and difficulty speaking.  With her multiple risk factors, I think that there would be prudent to treat this as likely TIA.  Stroke Risk Factors - diabetes mellitus, hyperlipidemia and hypertension  1. HgbA1c, fasting lipid panel 2. MRI, MRA  of the brain without contrast 3. Frequent neuro checks 4. Echocardiogram 5. Carotid dopplers 6. Prophylactic  therapy-Antiplatelet med: Aspirin - dose 325mg  PO or 300mg  PR 7. Risk factor modification 8. Telemetry monitoring 9. please page stroke NP  Or  PA  Or MD  from 8am -4 pm as this patient will be followed by the stroke team at this point.   You can look them up on [email or web address]    Roland Rack, MD [facility] [phone]  If 7pm- 7am, please page neurology on call as listed in [place].

## 2017-10-10 NOTE — ED Notes (Signed)
Pt is aware that a urine sample is needed.  

## 2017-10-11 ENCOUNTER — Inpatient Hospital Stay (HOSPITAL_BASED_OUTPATIENT_CLINIC_OR_DEPARTMENT_OTHER): Payer: Medicare Other

## 2017-10-11 DIAGNOSIS — I6310 Cerebral infarction due to embolism of unspecified precerebral artery: Secondary | ICD-10-CM | POA: Diagnosis not present

## 2017-10-11 DIAGNOSIS — G459 Transient cerebral ischemic attack, unspecified: Secondary | ICD-10-CM | POA: Diagnosis not present

## 2017-10-11 DIAGNOSIS — I5030 Unspecified diastolic (congestive) heart failure: Secondary | ICD-10-CM | POA: Diagnosis not present

## 2017-10-11 LAB — LIPID PANEL
CHOL/HDL RATIO: 5.4 ratio
Cholesterol: 167 mg/dL (ref 0–200)
HDL: 31 mg/dL — ABNORMAL LOW (ref >40)
LDL Cholesterol: 96 mg/dL (ref 0–99)
Triglycerides: 198 mg/dL — ABNORMAL HIGH (ref <150)
VLDL: 40 mg/dL (ref 0–40)

## 2017-10-11 LAB — HEMOGLOBIN A1C
Hgb A1c MFr Bld: 7.9 % — ABNORMAL HIGH (ref 4.8–5.6)
MEAN PLASMA GLUCOSE: 180.03 mg/dL

## 2017-10-11 LAB — ECHOCARDIOGRAM COMPLETE
HEIGHTINCHES: 66 in
WEIGHTICAEL: 2566.4 [oz_av]

## 2017-10-11 LAB — GLUCOSE, CAPILLARY
GLUCOSE-CAPILLARY: 86 mg/dL (ref 65–99)
Glucose-Capillary: 186 mg/dL — ABNORMAL HIGH (ref 65–99)
Glucose-Capillary: 229 mg/dL — ABNORMAL HIGH (ref 65–99)
Glucose-Capillary: 194 mg/dL — ABNORMAL HIGH (ref 65–99)

## 2017-10-11 MED ORDER — ASPIRIN 81 MG PO TBEC: 81.0000 mg | DELAYED_RELEASE_TABLET | Freq: Every day | ORAL | 12 refills | Status: DC

## 2017-10-11 MED ORDER — ROSUVASTATIN CALCIUM 5 MG PO TABS
2.5000 mg | ORAL_TABLET | Freq: Every day | ORAL | Status: DC
  Administered 2017-10-11: 2.5 mg via ORAL
  Filled 2017-10-11: qty 1

## 2017-10-11 MED ORDER — ATORVASTATIN CALCIUM 40 MG PO TABS: 40.0000 mg | ORAL_TABLET | Freq: Every day | ORAL | 12 refills | Status: DC

## 2017-10-11 MED ORDER — ASPIRIN EC 81 MG PO TBEC
81.0000 mg | DELAYED_RELEASE_TABLET | Freq: Every day | ORAL | Status: DC
  Administered 2017-10-11: 81 mg via ORAL
  Filled 2017-10-11: qty 1

## 2017-10-11 NOTE — Evaluation (Signed)
Speech Language Pathology Evaluation Patient Details Name: Diana Harper MRN: [id number] DOB: May 11, 1936 Today's Date: 10/11/2017 Time: 4098-1191 SLP Time Calculation (min) (ACUTE ONLY): 33 min  Problem List:  Patient Active Problem List   Diagnosis Date Noted  . Cerebral thrombosis with cerebral infarction 10/10/2017  . Cerebral embolism with cerebral infarction 10/10/2017  . Subarachnoid hemorrhage 10/10/2017  . Intracerebral hemorrhage 10/10/2017  . CVA (cerebral vascular accident) ([place]) 10/10/2017  . Chronic cholecystitis with calculus 08/25/2016  . Syncope 11/15/2011  . Diverticulitis 11/15/2011   Past Medical History:  Past Medical History:  Diagnosis Date  . Anemia   . Anxiety   . Arthritis   . Biliary colic   . Cholelithiasis   . CKD (chronic kidney disease), stage III [facility])    nephrologist--  dr Meredeth Ide  . Diverticulosis of colon   . History of asthma    1980's  no longer problem since 1980's  . History of diverticulitis of colon    recurrent--  2014;  2013;  2012  . Hyperlipidemia   . Hypertension   . Insulin dependent type 2 diabetes mellitus ([place])    followed by dr Jenny Reichmann lambeth ([facility])   . Murmur   . Peripheral neuropathy   . Vitamin D deficiency   . Wears hearing aid    bilateral  . Wears partial dentures    lower partial and upper full   Past Surgical History:  Past Surgical History:  Procedure Laterality Date  . ABDOMINAL HYSTERECTOMY  1970's  . CARDIOVASCULAR STRESS TEST  07/30/2009   normal exercise lexiscan nuclear study w/ no ischemia/  normal LV funciton and wall motion, ef 77%  . CATARACT EXTRACTION W/ INTRAOCULAR LENS  IMPLANT, BILATERAL  2014  . LUMBAR SPINE SURGERY  2014  . REMOVAL AXILLA CYST Right 1990's   HPI:  81 yo female adm to [facility] with increased RUE weakness, aphasia, and ataxia.  PMH + for CKD, asthma, bilateral hearing aids, no h/o CVA and she is a retired Banker.  Speech eval ordered.  MRi negative for acute  event.    Assessment / Plan / Recommendation Clinical Impression  Pt scoring on MOCA 7.1 was 19/30 indicative of cognitive deficits.  She was noted to require repetition of directions - likely due to decreased attention *as SLP spoke clearly for pt to understand given no hearing aids present.  Pt with difficulties with clock drawing, memory, attention and verbal fluency.  She is noted to have some dysfluencies with verbal expression and required repetition of several directions concerning for decreased attention.  Delayed processing of information apparent.  Of note, pt reports progressive memory loss x several months resulting in her missing her medications despite using a smart speaker to alarm at appropriate times.  She demonstrates excellent awareness to these difficulties and is concerned.  Given lack of new findings on MRI - and progressive nature of impairment, would recommend consider neuropsychcologist consult to help determine source.  Of note, pt's mother and sister have dementia.  Will follow up x1 for family education and confirmation of premorbid status as only pt present.  Educated pt to recommendations and provided memory strategies to pt.     SLP Assessment  SLP Recommendation/Assessment: Patient needs continued Speech Lanaguage Pathology Services SLP Visit Diagnosis: Cognitive communication deficit (R41.841)    Follow Up Recommendations  Other (comment)(consider neuropyschology consult as OP)    Frequency and Duration min 1 x/week  1 week      SLP Evaluation  Cognition  Overall Cognitive Status: No family/caregiver present to determine baseline cognitive functioning(pt reports progressive memory deficits) Arousal/Alertness: Awake/alert Orientation Level: Oriented to person;Oriented to place;Oriented to time;Oriented to situation Attention: Sustained Sustained Attention: Impaired Sustained Attention Impairment: Verbal complex(pt required repetition of directions) Memory:  Impaired(recalled 3/5 words independently, 2/5 with cues) Memory Impairment: Retrieval deficit Awareness: Impaired Awareness Impairment: Other (comment)(pt reporting difficulty with drawing for inaccurate clock drawing when she has placed hands in incorrect position) Problem Solving: Impaired Problem Solving Impairment: Functional complex(self correcting clock drawing, uses alexa at home for medicine reminders but can still forget) Safety/Judgment: Other (comment)(concern for awareness of memory deficits and ramification of missing medications)       Comprehension  Auditory Comprehension Overall Auditory Comprehension: Impaired Yes/No Questions: Not tested Commands: Within Functional Limits(for basic ) Interfering Components: Attention;Processing speed;Working memory EffectiveTechniques: Extra processing time;Repetition;Slowed speech Reading Comprehension Reading Status: Not tested    Expression Expression Primary Mode of Expression: Verbal Verbal Expression Overall Verbal Expression: Appears within functional limits for tasks assessed Initiation: No impairment Repetition: Impaired Level of Impairment: Sentence level(pt paraphrased correctly but did not repeat exact wording of end of sentence, suspect decreased attention) Naming: No impairment Pragmatics: No impairment Other Verbal Expression Comments: fluency impaired with pauses during sentence level pt initiated expression, naming words beginning with specific letter - pt able to name 5 F words only Written Expression Dominant Hand: Right Written Expression: Not tested   Oral / Motor  Oral Motor/Sensory Function Overall Oral Motor/Sensory Function: Within functional limits Motor Speech Overall Motor Speech: Appears within functional limits for tasks assessed Respiration: Within functional limits Resonance: Within functional limits Articulation: Within functional limitis Intelligibility: Intelligible Motor Planning: Witnin  functional limits   GO                    Macario Golds 10/11/2017, 1:04 PM  Luanna Salk, [facility] SLP [phone]

## 2017-10-11 NOTE — Progress Notes (Signed)
STROKE TEAM PROGRESS NOTE   SUBJECTIVE (INTERVAL HISTORY) No family  is at the bedside. Patient found laying in bed in NAD  Overall she feels her condition is completely resolved.  Voices no new complaints. No new events reported overnight. Patient asking if she can go home today.  OBJECTIVE Recent Labs  Lab 10/10/17 1609 10/10/17 2201 10/11/17 0629 10/11/17 1135  GLUCAP 229* 203* 86 186*   Recent Labs  Lab 10/10/17 1605 10/10/17 1618  NA 135 138  K 4.1 4.1  CL 103 102  CO2 26  --   GLUCOSE 234* 233*  BUN 22* 25*  CREATININE 1.71* 1.60*  CALCIUM 9.4  --    Recent Labs  Lab 10/10/17 1605  AST 20  ALT 17  ALKPHOS 58  BILITOT 0.8  PROT 7.0  ALBUMIN 4.0   Recent Labs  Lab 10/10/17 1605 10/10/17 1618  WBC 6.8  --   NEUTROABS 4.0  --   HGB 12.8 12.9  HCT 37.8 38.0  MCV 88.5  --   PLT 239  --    No results for input(s): CKTOTAL, CKMB, CKMBINDEX, TROPONINI in the last 168 hours. Recent Labs    10/10/17 1605  LABPROT 13.0  INR 0.99   Recent Labs    10/10/17 1830  COLORURINE STRAW*  LABSPEC 1.009  PHURINE 6.0  GLUCOSEU NEGATIVE  HGBUR NEGATIVE  BILIRUBINUR NEGATIVE  KETONESUR NEGATIVE  PROTEINUR NEGATIVE  NITRITE NEGATIVE  LEUKOCYTESUR SMALL*       Component Value Date/Time   CHOL 167 10/11/2017 0226   TRIG 198 (H) 10/11/2017 0226   HDL 31 (L) 10/11/2017 0226   CHOLHDL 5.4 10/11/2017 0226   VLDL 40 10/11/2017 0226   LDLCALC 96 10/11/2017 0226   Lab Results  Component Value Date   HGBA1C 7.9 (H) 10/11/2017      Component Value Date/Time   LABOPIA NONE DETECTED 10/10/2017 1844   COCAINSCRNUR NONE DETECTED 10/10/2017 1844   LABBENZ NONE DETECTED 10/10/2017 1844   AMPHETMU NONE DETECTED 10/10/2017 1844   THCU NONE DETECTED 10/10/2017 1844   LABBARB NONE DETECTED 10/10/2017 1844    Recent Labs  Lab 10/10/17 1605  ETH <10    IMAGING: I have personally reviewed the radiological images below and agree with the radiology  interpretations.  MRI/MRA Brain Wo Contrast Result Date: 10/10/2017 IMPRESSION: 1. Chronic ischemic microangiopathy without acute intracranial abnormality. 2. No emergent large vessel occlusion or high-grade intracranial stenosis.   Ct Head Code Stroke Wo Contrast Result Date: 10/10/2017 IMPRESSION: 1. No hemorrhage or mass effect. 2. Findings of chronic ischemic microangiopathy without acute abnormality. 3. ASPECTS is 10.   ECHO - PENDING CAROTID US/ - PENDING  PHYSICAL EXAM Temp:  [98.1 F (36.7 C)-99.5 F (37.5 C)] 98.7 F (37.1 C) (11/13 0945) Pulse Rate:  [84-99] 94 (11/13 0945) Resp:  [15-36] 18 (11/13 0945) BP: (121-173)/(55-103) 130/55 (11/13 0945) SpO2:  [97 %-100 %] 97 % (11/13 0945) Weight:  [72.4 kg (159 lb 9.8 oz)-72.8 kg (160 lb 6.4 oz)] 72.8 kg (160 lb 6.4 oz) (11/12 2000)  General -obese elderly African-American lady, in no apparent distress Respiratory - Lungs clear bilaterally. No wheezing. Cardiovascular - Regular rate and rhythm   Neurological Examination Mental Status: Alert, oriented, thought content appropriate.  Speech fluent with hesitation without evidence of aphasia.  Difficulty  Doing multiple step commands at once.  Cranial Nerves: II: Discs flat bilaterally; Visual fields grossly normal,  III,IV, VI: ptosis not present, extra-ocular motions intact bilaterally, pupils  equal, round, reactive to light and accommodation V,VII: smile symmetric, facial light touch sensation normal bilaterally VIII: hearing normal bilaterally IX,X: uvula rises symmetrically XI: bilateral shoulder shrug XII: midline tongue extension Motor: Right :  Upper extremity   5/5                                      Left:     Upper extremity   5/5             Lower extremity   5/5                                                  Lower extremity   5/5 Tone and bulk:normal tone throughout; no atrophy noted She has a diffuse tremor which appears most consistent with a markedly  enhanced physiologic tremor, she does not perform distraction well enough to tell if it is a distractible tremor or not. Sensory: decreased sensation on right arm and leg Deep Tendon Reflexes: 1+ and symmetric throughout but will not relax Plantars: Right: downgoing                                Left: downgoing Cerebellar: normal finger-to-nose--but done with jerky motions  and normal heel-to-shin test Gait: not tested   ASSESSMENT AND PLAN: [name] is a 81 y.o. female with PMH of HTN, HLD, DM2,  admitted for transient right arm dysfunction and difficulty speaking  Likely left brain TIA: secondary to small vessel disease with uncontrolled risk factors. Resultant Symptoms - All admission symptoms resolved Stroke Risk Factors - diabetes mellitus, hyperlipidemia and hypertension   MRA Head/Neck - No acute findings  CT Head- No acute findings   ECHO - PENDING CAROTID US/ - PENDING        Recommendations If ECHO and Carotid U/S have no acute findings, patient may be d/c'ed home with close PCP follow up  LDL - 96  HgbA1c 7.9  VTE prophylaxis - Lovenox  Diet - Diet heart healthy/carb modified Room service appropriate? Yes; Fluid consistency: Thin   CVA Meds-   No antithrombotic prior to admission, now on ASA 81 mg daily.   Please discharge patient on aspirin EC 81 mg daily - added today.  Patient has a remote Hx of ulcer-no Hx of GI bleeding. ASA EC 81 mg started today. Trial is warranted in setting of TIA symptoms. Patient instructed to stop immediately if any GI side effects.   Patient counseled to be compliant with her antithrombotic medications  Ongoing aggressive stroke risk factor management  Therapy recommendations:  HH  Disposition: HOME        Follow up Appointments:    PCP follow up   Follow up with [facility], Dr Leonie Man in 6 weeks  HYPERTENSION: Stable Permissive hypertension (OK if <220/120) for 24-48 hours post stroke and  then gradually normalized within 5-7 days.  Long term BP goal normotensive.   May slowly restart home B/P medications after 48 hours with close PCP follow up.  HYPERLIPIDEMIA:  Home meds:  Fenofibrate 160 mg daily, Triglycerides 198  LDL  96 , goal < 70  Started on Crestor 2.5 mg daily, Hx of  rash with Simvastatin. Trial of Crestor is warranted. Patient instructed to stop immediately if rash is noted  Continue statin at discharge  DIABETES:  HgbA1c   7.9    goal < 7.0  Uncontrolled  Currently on   CBG monitoring and SSI  DM education and close PCP follow up  Other Stroke Risk Factors:  Advanced age  Other Active Problems:  CKD III  Hypothyroidism  Hospital day # 1  Renie Ora Stroke Neurology Team 10/11/2017 2:41 PM I have personally examined this patient, reviewed notes, independently viewed imaging studies, participated in medical decision making and plan of care.ROS completed by me personally and pertinent positives fully documented  I have made any additions or clarifications directly to the above note. Agree with note above. Start aspirin for stroke prevention and low-dose Crestor. Aggressive risk factor modification. Greater than 50% time during this 25 minute visit was spent on counseling and coordination of care about her TIA and stroke prevention discussion. Discussed with Dr. Verlon Au.  Antony Contras, MD Medical Director [facility] Pager: [phone] 10/11/2017 3:29 PM  Neurology to sign-off at this time. Please call with any further questions or concerns. Thank you for this consultation.  To contact Stroke Continuity provider, please refer to [email or web address] After hours, contact General Neurology

## 2017-10-11 NOTE — Care Management Obs Status (Signed)
[place] NOTIFICATION   Patient Details  Name: Diana Harper MRN: [id number] Date of Birth: 1936/08/01   Medicare Observation Status Notification Given:  Yes    Apolonio Schneiders, RN 10/11/2017, 6:15 PM

## 2017-10-11 NOTE — Progress Notes (Signed)
Discharged with daughter via wheelchair.  Both verbalizes understanding of discharge instructions, follow-up & signs / symptoms of stroke /call 911.

## 2017-10-11 NOTE — Evaluation (Signed)
Occupational Therapy Evaluation Patient Details Name: Diana Harper MRN: [id number] DOB: 07/16/1936 Today's Date: 10/11/2017    History of Present Illness 81 yo female with PHM including HTN, DM type 2 with neuropathy, osteoporosis, and stg III ckd. Pt was at primary care physician and reports difficulty writing with R hand and some aphasia noted. EMS transport to ED for possible stroke work up. Pt with 4 syncopal episodes in last 6 months; 3 while on toilet passing stool. MRI showingchronic ischemic microangiopathy without acute intracranial abnormality.   Clinical Impression   PTA, pt was living with her mother and daughter and performed ADLs and light IADLs. Pt currently requiring Min Guard A for LB ADLs and functional mobility with SPC due to poor balance. Pt presenting with slow processing, poor coordination, and decreased balance. Pt would benefit from further acute OT to facilitate safe dc. Recommend dc home with HHOT to optimize safety and independence with ADLs and functional mobility.     Follow Up Recommendations  Home health OT;Supervision/Assistance - 24 hour    Equipment Recommendations  None recommended by OT    Recommendations for Other Services PT consult     Precautions / Restrictions Precautions Precautions: Fall Restrictions Weight Bearing Restrictions: No      Mobility Bed Mobility Overal bed mobility: Needs Assistance Bed Mobility: Supine to Sit     Supine to sit: Supervision     General bed mobility comments: supervision for safety  Transfers Overall transfer level: Needs assistance Equipment used: None;Straight cane Transfers: Sit to/from Stand Sit to Stand: Min guard         General transfer comment: Mi nguard for safety.     Balance Overall balance assessment: Needs assistance Sitting-balance support: No upper extremity supported;Feet supported Sitting balance-Leahy Scale: Fair     Standing balance support: No upper extremity  supported;During functional activity Standing balance-Leahy Scale: Fair Standing balance comment: Able to maintain static standing without UE support. Requires UE supportfor dyanmic balacne and mobility                           ADL either performed or assessed with clinical judgement   ADL Overall ADL's : Needs assistance/impaired Eating/Feeding: Set up;Sitting   Grooming: Set up;Supervision/safety;Standing;Oral care;Wash/dry face;Brushing hair;Sitting Grooming Details (indicate cue type and reason): Pt performing oral care and washing her face at sink with supervision adn set up. Pt demosntrating decreased grasp strength and coorindation. When asked if pt's hands shake normally she reports "they have been recently."  Pt requiring to sit to brush hair reporting that her back hurts when standing too long. Also states she has a stool in her bathroom to sit during grooming as needed Upper Body Bathing: Set up;Supervision/ safety;Sitting   Lower Body Bathing: Min guard;Sit to/from stand   Upper Body Dressing : Set up;Supervision/safety;Sitting   Lower Body Dressing: Min guard;Sit to/from stand Lower Body Dressing Details (indicate cue type and reason): Able to adjust socks by bringing ankel to need. Noted poor coordination during task Toilet Transfer: Min guard;Ambulation(single point cane; simualted to recliner)           Functional mobility during ADLs: Min guard;Cane General ADL Comments: Pt demonstrating decreased fucntional performance compared to baseline. Pt requiring increased time and demosntrating slow processing and poor coorindation.      Vision Baseline Vision/History: Wears glasses Wears Glasses: Reading only Patient Visual Report: No change from baseline;Other (comment)(Has had floaters in vision for past 4 weeks) Vision  Assessment?: Yes;Vision impaired- to be further tested in functional context Eye Alignment: Within Functional Limits Tracking/Visual  Pursuits: Requires cues, head turns, or add eye shifts to track Additional Comments: Pt needing additional cues to prevent head turns and unabel to hold eye gaze laterally and returns to midline     Perception     Praxis      Pertinent Vitals/Pain Pain Assessment: No/denies pain     Hand Dominance Right   Extremity/Trunk Assessment Upper Extremity Assessment Upper Extremity Assessment: RUE deficits/detail;LUE deficits/detail RUE Deficits / Details: Weakness in R compared to L. Pt with decreased coordination with intensional targeted movements as seen during finger-to-nose test and finger opposition. Pt demonstrating shaking and decreased coordination during oral care and grooming. However, able to brade hair demonstraiting FM skills RUE Coordination: decreased gross motor LUE Deficits / Details: Decreased coorination. Pt with difficulty performing finger to nose test and opposition.    Lower Extremity Assessment Lower Extremity Assessment: Defer to PT evaluation   Cervical / Trunk Assessment Cervical / Trunk Assessment: Other exceptions Cervical / Trunk Exceptions: prior back surgery   Communication Communication Communication: No difficulties;Other (comment)(Requires increased time. Pt stating "I am normally fluent")   Cognition Arousal/Alertness: Awake/alert Behavior During Therapy: WFL for tasks assessed/performed Overall Cognitive Status: Impaired/Different from baseline Area of Impairment: Following commands;Problem solving                       Following Commands: Follows multi-step commands with increased time;Follows one step commands with increased time     Problem Solving: Slow processing General Comments: Pt presenting with slow processing and requires increased time. Pt with difficulty following finger-to-nose steps and tracking test. Pt able to perform habitual ADLs such as brushing tooth and braiding hair.  Pt recalling 3/3 memory words with increased  time. Able to name 2 animals starting with letter F.   General Comments       Exercises     Shoulder Instructions      Home Living Family/patient expects to be discharged to:: Private residence Living Arrangements: Children;Parent Available Help at Discharge: Family;Available 24 hours/day Type of Home: House Home Access: Stairs to enter [employer] of Steps: 4 Entrance Stairs-Rails: Right;Left Home Layout: One level     Bathroom Shower/Tub: Tub/shower unit;Curtain   Biochemist, clinical: Standard(BSC over)     Home Equipment: Bedside commode;Walker - 2 wheels;Wheelchair - manual;Grab bars - tub/shower;Shower seat;Cane - single point   Additional Comments: Equipment for mother      Prior Functioning/Environment Level of Independence: Independent        Comments: ADLs, light ADLs, driving (short distance), caregiver for mother. walks with single point cane        OT Problem List: Decreased strength;Decreased range of motion;Decreased activity tolerance;Impaired balance (sitting and/or standing);Decreased coordination;Decreased cognition;Decreased safety awareness;Decreased knowledge of use of DME or AE      OT Treatment/Interventions: Self-care/ADL training;Therapeutic exercise;Energy conservation;DME and/or AE instruction;Therapeutic activities;Patient/family education    OT Goals(Current goals can be found in the care plan section) Acute Rehab OT Goals Patient Stated Goal: Go home OT Goal Formulation: With patient Time For Goal Achievement: 10/25/17 Potential to Achieve Goals: Good ADL Goals Pt Will Perform Grooming: with modified independence;standing Pt Will Perform Upper Body Dressing: with modified independence;sitting Pt Will Perform Lower Body Dressing: with modified independence;sit to/from stand Pt Will Transfer to Toilet: with modified independence;bedside commode;ambulating Pt Will Perform Toileting - Clothing Manipulation and hygiene: with  modified independence;sit to/from stand  Pt Will Perform Tub/Shower Transfer: Tub transfer;with supervision;ambulating;shower seat  OT Frequency: Min 3X/week   Barriers to D/C:            Co-evaluation              AM-PAC PT "6 Clicks" Daily Activity     Outcome Measure Help from another person eating meals?: None Help from another person taking care of personal grooming?: A Little Help from another person toileting, which includes using toliet, bedpan, or urinal?: A Little Help from another person bathing (including washing, rinsing, drying)?: A Little Help from another person to put on and taking off regular upper body clothing?: A Little Help from another person to put on and taking off regular lower body clothing?: A Little 6 Click Score: 19   End of Session Equipment Utilized During Treatment: Other (comment)(SPC) Nurse Communication: Mobility status  Activity Tolerance: Patient tolerated treatment well Patient left: in chair;with chair alarm set;with call bell/phone within reach  OT Visit Diagnosis: Unsteadiness on feet (R26.81);Other abnormalities of gait and mobility (R26.89);Muscle weakness (generalized) (M62.81);Other symptoms and signs involving cognitive function                Time: 1540-0867 OT Time Calculation (min): 27 min Charges:  OT General Charges $OT Visit: 1 Visit OT Evaluation $OT Eval Moderate Complexity: 1 Mod OT Treatments $Self Care/Home Management : 8-22 mins G-Codes:     [place], OTR/L Acute Rehab Pager: [phone] Office: [place] 10/11/2017, 8:48 AM

## 2017-10-11 NOTE — Care Management CC44 (Signed)
Condition Code 44 Documentation Completed  Patient Details  Name: Diana Harper MRN: [id number] Date of Birth: 22-Jan-1936   Condition Code 44 given:  Yes Patient signature on Condition Code 44 notice:  Yes Documentation of 2 MD's agreement:  Yes Code 44 added to claim:  Yes    Apolonio Schneiders, RN 10/11/2017, 6:15 PM

## 2017-10-11 NOTE — Care Management Note (Signed)
Case Management Note  Patient Details  Name: Diana Harper MRN: [id number] Date of Birth: 1936-08-17  Subjective/Objective:    Pt in to r/o CVA. She is from home with family.                Action/Plan: Awaiting PT eval. OT recommending [place] services. CM following for d/c needs, physician orders.   Expected Discharge Date:                  Expected Discharge Plan:  [place]  In-House Referral:     Discharge planning Services  CM Consult  Post Acute Care Choice:    Choice offered to:     DME Arranged:    DME Agency:     HH Arranged:    [place] Agency:     Status of Service:  In process, will continue to follow  If discussed at Long Length of Stay Meetings, dates discussed:    Additional Comments:  Pollie Friar, RN 10/11/2017, 10:10 AM

## 2017-10-11 NOTE — Evaluation (Deleted)
Clinical/Bedside Swallow Evaluation Patient Details  Name: Diana Harper MRN: [id number] Date of Birth: 14-Jan-1936  Today's Date: 10/11/2017 Time: SLP Start Time (ACUTE ONLY): 0815 SLP Stop Time (ACUTE ONLY): 0837 SLP Time Calculation (min) (ACUTE ONLY): 22 min  Past Medical History:  Past Medical History:  Diagnosis Date  . Anemia   . Anxiety   . Arthritis   . Biliary colic   . Cholelithiasis   . CKD (chronic kidney disease), stage III [facility])    nephrologist--  dr Meredeth Ide  . Diverticulosis of colon   . History of asthma    1980's  no longer problem since 1980's  . History of diverticulitis of colon    recurrent--  2014;  2013;  2012  . Hyperlipidemia   . Hypertension   . Insulin dependent type 2 diabetes mellitus ([place])    followed by dr Jenny Reichmann lamb[facility] ([facility])   . Murmur   . Peripheral neuropathy   . Vitamin D deficiency   . Wears hearing aid    bilateral  . Wears partial dentures    lower partial and upper full   Past Surgical History:  Past Surgical History:  Procedure Laterality Date  . ABDOMINAL HYSTERECTOMY  1970's  . CARDIOVASCULAR STRESS TEST  07/30/2009   normal exercise lexiscan nuclear study w/ no ischemia/  normal LV funciton and wall motion, ef 77%  . CATARACT EXTRACTION W/ INTRAOCULAR LENS  IMPLANT, BILATERAL  2014  . LUMBAR SPINE SURGERY  2014  . 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  Pt found to have left frontoparietal cva/mca cva.  Daughter present and reports pt with no dysphagia prior to admission.  Pt is mostly nonverbal but does follow commands per daughter.  Pt also with decreased po intake recently per her daughter resulting in approximately 20 pound weight loss.     Assessment / Plan / Recommendation Clinical Impression  Pt presents with functional oropharyngeal swallow ability.  NO  indications of aspiration/penetration with po observed.  She passed [facility] 3 ounce water test easily.  Pt is edentulous and does not use dentures, therefore recommend dys3/thin diet.  Educated pt and her daughter to findings/recommendations.  Advised daughter to symptoms of dysphagia.  Pt taken for vascular lab therefore SLE unable to be conducted at this time.  Will follow up for sle as able.  No follow up regarding swallowing indicated.   SLP Visit Diagnosis: Dysphagia, unspecified (R13.10)    Aspiration Risk  Mild aspiration risk    Diet Recommendation Dysphagia 3 (Mech soft);No liquids   Liquid Administration via: Cup Medication Administration: Whole meds with liquid Supervision: Staff to assist with self feeding Compensations: Slow rate;Small sips/bites Postural Changes: Remain upright for at least 30 minutes after po intake;Seated upright at 90 degrees    Other  Recommendations Oral Care Recommendations: Oral care BID   Follow up Recommendations   SLP for speech/language evaluation     Frequency and Duration   TBD         Prognosis   n/a     Swallow Study   General Date of Onset: 10/11/17 HPI: 81 yo female adm to [facility] with h/o dementia, HTN, aphasia, GERD, 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  Pt found to have left frontoparietal  cva/mca cva.  Daughter present and reports pt with no dysphagia prior to admission.  Pt is mostly nonverbal but does follow commands per daughter.  Pt also with decreased po intake recently per her daughter resulting in approximately 20 pound weight loss.   Type of Study: Bedside Swallow Evaluation Diet Prior to this Study: NPO Temperature Spikes Noted: No Respiratory Status: Room air History of Recent Intubation: No Behavior/Cognition: Alert Oral Cavity Assessment: Within Functional Limits Oral Care Completed by SLP: No Oral Cavity - Dentition: Edentulous(does not have dentures) Vision:  Functional for self-feeding Self-Feeding Abilities: Able to feed self;Needs assist Patient Positioning: Upright in bed Baseline Vocal Quality: Normal Volitional Cough: Cognitively unable to elicit Volitional Swallow: Unable to elicit    Oral/Motor/Sensory Function Overall Oral Motor/Sensory Function: Generalized oral weakness(pt leaning to the left which daughter states is normal, no gross focal cn deficits from limited instruction pt able to follow)   Ice Chips Ice chips: Within functional limits Presentation: Spoon   Thin Liquid Thin Liquid: Within functional limits Presentation: Cup;Self Fed;Spoon;Straw Other Comments: multiple swallows, likely piecemealing    Nectar Thick Nectar Thick Liquid: Not tested   Honey Thick Honey Thick Liquid: Not tested   Puree Puree: Within functional limits Presentation: Spoon   Solid   GO   Solid: Within functional limits Other Comments: graham cracker        Macario Golds 10/11/2017,9:00 AM  Luanna Salk, [place] [facility] SLP [phone]

## 2017-10-11 NOTE — Evaluation (Addendum)
Physical Therapy Evaluation Patient Details Name: Diana Harper MRN: [id number] DOB: [birth year] Today's Date: 10/11/2017   History of Present Illness  81 yo female with PMH including HTN, DM type 2 with neuropathy, osteoporosis, and CKD stage III.[name] Pt was at primary care physician and reports difficulty writing with R hand and some aphasia noted. EMS transport to ED for possible stroke work up. Pt with 4 syncopal episodes in last 6 months; 3 while on toilet passing stool. MRI brain- negative for anything acute.   Clinical Impression  Patient presents with generalized weakness, deconditioning, impaired activity tolerance, impaired balance, tremors BUEs and impaired mobility s/p above. Pt Mod I PTA and uses SPC for mobility. Helps care for mother with Alzheimer's per her report. Pt fatigues very quickly with mobility and demonstrates bil knee instability post stair training and with increased distance. Pt required multiple standing rest breaks. Pt has support of daughter at home. Will follow acutely to maximize independence and mobility prior to return home.     Follow Up Recommendations Home health PT;Supervision for mobility/OOB    Equipment Recommendations  None recommended by PT    Recommendations for Other Services       Precautions / Restrictions Precautions Precautions: Fall Restrictions Weight Bearing Restrictions: No      Mobility  Bed Mobility Overal bed mobility: Needs Assistance Bed Mobility: Sit to Supine       Sit to supine: Supervision;HOB elevated   General bed mobility comments: supervision for safety; no assist needed.   Transfers Overall transfer level: Needs assistance Equipment used: Straight cane Transfers: Sit to/from Stand Sit to Stand: Min guard         General transfer comment: Min guard for safety. Stood from Youth worker.  Ambulation/Gait Ambulation/Gait assistance: Min assist Ambulation Distance (Feet): 200 Feet Assistive device: Straight  cane Gait Pattern/deviations: Step-to pattern;Step-through pattern;Narrow base of support;Staggering right;Staggering left;Drifts right/left Gait velocity: decreared Gait velocity interpretation: <1.8 ft/sec, indicative of risk for recurrent falls General Gait Details: Slow, unsteady gait with bil knee instability noted towards end of ambulation and 4 standing rest breaks due to fatigue and weakness. HR ranged from 90-106 bpm.  Stairs Stairs: Yes Stairs assistance: Mod assist;Min assist Stair Management: Step to pattern;One rail Left;With cane Number of Stairs: 3 General stair comments: Cues for techinque, assist to ascend step and to descend due to LOB x2.   Wheelchair Mobility    Modified Rankin (Stroke Patients Only) Modified Rankin (Stroke Patients Only) Pre-Morbid Rankin Score: Moderate disability Modified Rankin: Moderately severe disability     Balance Overall balance assessment: Needs assistance Sitting-balance support: No upper extremity supported;Feet supported Sitting balance-Leahy Scale: Fair     Standing balance support: During functional activity;Single extremity supported   Standing balance comment: Requires UE support in standing esp when fatigued.                              Pertinent Vitals/Pain Pain Assessment: No/denies pain    Home Living Family/patient expects to be discharged to:: Private residence Living Arrangements: Children;Parent Available Help at Discharge: Family;Available 24 hours/day Type of Home: House Home Access: Stairs to enter Entrance Stairs-Rails: Psychiatric nurse of Steps: 4 Home Layout: One level Home Equipment: Bedside commode;Walker - 2 wheels;Wheelchair - manual;Grab bars - tub/shower;Shower seat;Cane - single point      Prior Function Level of Independence: Independent with assistive device(s)         Comments: ADLs, light  ADLs, driving (short distance), caregiver for mother. walks with  single point cane     Hand Dominance   Dominant Hand: Right    Extremity/Trunk Assessment   Upper Extremity Assessment Upper Extremity Assessment: Defer to OT evaluation    Lower Extremity Assessment Lower Extremity Assessment: Generalized weakness(pt with BLE weakness noted after walking with trembling LEs and almost knee buckling with increased distance. )    Cervical / Trunk Assessment Cervical / Trunk Assessment: Other exceptions Cervical / Trunk Exceptions: prior back surgery  Communication   Communication: Expressive difficulties(reports increased time to get words out)  Cognition Arousal/Alertness: Awake/alert Behavior During Therapy: WFL for tasks assessed/performed Overall Cognitive Status: Impaired/Different from baseline Area of Impairment: Following commands;Problem solving                       Following Commands: Follows multi-step commands with increased time;Follows one step commands with increased time     Problem Solving: Slow processing General Comments: Pt presenting with slow processing and requires increased time. Resting and intention tremor noted in BUEs which pt reports as new. Able to read clock across room. without difficulty. Reports some floaters in her eye since surgery 1 week ago.      General Comments      Exercises     Assessment/Plan    PT Assessment Patient needs continued PT services  PT Problem List Decreased mobility;Decreased strength;Decreased balance;Decreased activity tolerance;Decreased cognition       PT Treatment Interventions Functional mobility training;Balance training;Patient/family education;Gait training;Therapeutic activities;Therapeutic exercise;Cognitive remediation    PT Goals (Current goals can be found in the Care Plan section)  Acute Rehab PT Goals Patient Stated Goal: to go home PT Goal Formulation: With patient Time For Goal Achievement: 10/25/17 Potential to Achieve Goals: Good    Frequency  Min 3X/week   Barriers to discharge        Co-evaluation               AM-PAC PT "6 Clicks" Daily Activity  Outcome Measure Difficulty turning over in bed (including adjusting bedclothes, sheets and blankets)?: None Difficulty moving from lying on back to sitting on the side of the bed? : None Difficulty sitting down on and standing up from a chair with arms (e.g., wheelchair, bedside commode, etc,.)?: None Help needed moving to and from a bed to chair (including a wheelchair)?: A Little Help needed walking in hospital room?: A Little Help needed climbing 3-5 steps with a railing? : A Lot 6 Click Score: 20    End of Session Equipment Utilized During Treatment: Gait belt Activity Tolerance: Patient tolerated treatment well Patient left: with call bell/phone within reach;in bed;with bed alarm set Nurse Communication: Mobility status PT Visit Diagnosis: Muscle weakness (generalized) (M62.81);Unsteadiness on feet (R26.81);Other abnormalities of gait and mobility (R26.89)    Time: 7414-2395 PT Time Calculation (min) (ACUTE ONLY): 22 min   Charges:   PT Evaluation $PT Eval Low Complexity: 1 Low     PT G CodesWray Kearns, PT, DPT [phone]    Marguarite Arbour A Katharine Rochefort 10/11/2017, 11:14 AM

## 2017-10-11 NOTE — Progress Notes (Signed)
  Echocardiogram 2D Echocardiogram has been performed.  Diana Harper 10/11/2017, 3:32 PM

## 2017-10-11 NOTE — Progress Notes (Signed)
Preliminary results by tech - Carotid Duplex Completed. No evidence of a significant stenosis in bilateral carotid arteries. Vertebral arteries are patent with antegrade flow. Oda Cogan, BS, RDMS, RVT

## 2017-10-12 NOTE — Progress Notes (Signed)
PT eval addendum- added G-codes    2017/10/13 1119  PT G-Codes **NOT FOR INPATIENT CLASS**  Functional Assessment Tool Used Clinical judgement  Functional Limitation Mobility: Walking and moving around  Mobility: Walking and Moving Around Current Status (B2841) CJ  Mobility: Walking and Moving Around Goal Status (L2440) CI   Wray Kearns, PT, DPT [phone]

## 2017-10-12 NOTE — Progress Notes (Signed)
OT Evaluation - G-code Late Entry    10/11/17 0700  OT Time Calculation  OT Start Time (ACUTE ONLY) 0754  OT Stop Time (ACUTE ONLY) 1028  OT Time Calculation (min) 27 min  OT G-codes **NOT FOR INPATIENT CLASS**  Functional Assessment Tool Used Clinical judgement  Functional Limitation Self care  Self Care Current Status (D0228) CI  Self Care Goal Status (O0698) [place] MSOT, OTR/L Acute Rehab Pager: [phone] Office: [phone]

## 2017-10-13 NOTE — Progress Notes (Signed)
   10/11/17 1304  SLP G-Codes **NOT FOR INPATIENT CLASS**  Functional Assessment Tool Used MOCA  Functional Limitations Memory  Memory Current Status (K1828) CL  Memory Goal Status (Q3374) CL  Memory Discharge Status (G9170) CL  SLP Evaluations  $ SLP Speech Visit 1 Visit  SLP Evaluations  $ SLP EVAL LANGUAGE/SOUND PRODUCTION 1 Procedure  $Cognitive Skills Development 8-22  Diana Harper, [place] [facility] SLP [phone]

## 2017-10-17 DIAGNOSIS — E11319 Type 2 diabetes mellitus with unspecified diabetic retinopathy without macular edema: Secondary | ICD-10-CM | POA: Diagnosis not present

## 2017-10-17 DIAGNOSIS — D638 Anemia in other chronic diseases classified elsewhere: Secondary | ICD-10-CM | POA: Diagnosis not present

## 2017-10-17 DIAGNOSIS — I639 Cerebral infarction, unspecified: Secondary | ICD-10-CM | POA: Diagnosis not present

## 2017-10-17 DIAGNOSIS — N183 Chronic kidney disease, stage 3 (moderate): Secondary | ICD-10-CM | POA: Diagnosis not present

## 2017-10-17 DIAGNOSIS — I10 Essential (primary) hypertension: Secondary | ICD-10-CM | POA: Diagnosis not present

## 2017-10-17 DIAGNOSIS — I129 Hypertensive chronic kidney disease with stage 1 through stage 4 chronic kidney disease, or unspecified chronic kidney disease: Secondary | ICD-10-CM | POA: Diagnosis not present

## 2017-10-17 DIAGNOSIS — E782 Mixed hyperlipidemia: Secondary | ICD-10-CM | POA: Diagnosis not present

## 2017-10-17 DIAGNOSIS — E1165 Type 2 diabetes mellitus with hyperglycemia: Secondary | ICD-10-CM | POA: Diagnosis not present

## 2017-10-17 DIAGNOSIS — Z6827 Body mass index (BMI) 27.0-27.9, adult: Secondary | ICD-10-CM | POA: Diagnosis not present

## 2017-10-17 DIAGNOSIS — N2581 Secondary hyperparathyroidism of renal origin: Secondary | ICD-10-CM | POA: Diagnosis not present

## 2017-10-17 DIAGNOSIS — E1122 Type 2 diabetes mellitus with diabetic chronic kidney disease: Secondary | ICD-10-CM | POA: Diagnosis not present

## 2017-10-17 DIAGNOSIS — G459 Transient cerebral ischemic attack, unspecified: Secondary | ICD-10-CM | POA: Diagnosis not present

## 2017-10-17 DIAGNOSIS — E039 Hypothyroidism, unspecified: Secondary | ICD-10-CM | POA: Diagnosis not present

## 2017-11-01 NOTE — Discharge Summary (Signed)
Physician Discharge Summary  Diana Harper KCL:[id number] DOB: [birth year] DOA: 10/10/2017  PCP: Saralyn Pilar, MD  Admit date: 10/10/2017 Discharge date: 11/01/2017  Time spent: 25 minutes  Recommendations for Outpatient Follow-up:  1. Start ASA and crestor 2. Follow up with neurology as OP  Discharge Diagnoses:  Active Problems:   Cerebral thrombosis with cerebral infarction   Cerebral embolism with cerebral infarction   Subarachnoid hemorrhage   Intracerebral hemorrhage   CVA (cerebral vascular accident) [facility])   Discharge Condition: improved  Diet recommendation: heart healthy  Filed Weights   10/10/17 1600 10/10/17 1628 10/10/17 2000  Weight: 72.4 kg (159 lb 9.8 oz) 72.4 kg (159 lb 9.8 oz) 72.8 kg (160 lb 6.4 oz)    History of present illness:  81 year old female HTN Diabetes mellitus type 2 on insulin with neuropathy Osteoporosis stg III ckd seen by Nephro Prior diverticulitis Prior admission for syncope 11/15/2011 Prior hospital admission for lap chole 07/2017  Admit from primary care physician office - aphasia noted this is at around 315 this lasted until EMS tracks were called in Patient did not have any slurred speech but was unable to speak as above She states that her right hand felt numb as well and this is happened before the past couple months  Emergency room workup showed BUN/creatinine 25/1.6 potassium 4.1 hemoglobin 12.9 EKG was done showing normal sinus rhythm QRS axis XX 3 PR interval 0.02 no ST-T wave changes although wandering baseline based on EKG chest x-ray two-view did not show any findings   Hospital Course:  tia-work-up complete-patient place don asa crestor ckd iii-stable on d/c htn-home on amlodipine--2/2 prevention Bipolar cont sertraline 50 on d/c  Procedures: Mri Ct Echo Carotids   Consultations:  neurology  Discharge Exam: Vitals:   10/11/17 0600 10/11/17 0945  BP: 129/65 (!) 130/55  Pulse: 87 94  Resp: 20 18   Temp: 98.8 F (37.1 C) 98.7 F (37.1 C)  SpO2: 98% 97%    General: awake alert in nad Cardiovascular: s1 s2 no m/r/g Respiratory: clear no added sound Power intact  Discharge Instructions   Discharge Instructions    Ambulatory referral to Neurology   Complete by:  As directed    An appointment is requested in approximately: 6 weeks Follow up with stroke clinic (Dr Leonie Man preferred, if not available, then consider Caesar Chestnut, [facility] or Jaynee Eagles whoever is available) at [facility] in about 6-8 weeks. Thanks.   Diet - low sodium heart healthy   Complete by:  As directed    Discharge instructions   Complete by:  As directed    Take aspirin 81 enteric coated daily forstroke prevention Follow up with pcp for labs 1 week Best of luck   Increase activity slowly   Complete by:  As directed      Allergies as of 10/11/2017      Reactions   Latex Anaphylaxis, Shortness Of Breath   : severe respiratory distress   Onion Diarrhea   Asa [aspirin] Other (See Comments)   Avoids due to hx gastric ulcer   Caffeine Other (See Comments)   Stomach pain from pepsi   Lisinopril Cough   Metformin And Related Diarrhea   Simvastatin Rash   Tetracycline Rash   Zithromax [azithromycin] Rash      Medication List    TAKE these medications   acetaminophen 500 MG tablet Commonly known as:  TYLENOL Take 1,000 mg by mouth every 6 (six) hours as needed for mild pain, moderate pain, fever  or headache.   amLODipine 5 MG tablet Commonly known as:  NORVASC Take 5 mg daily with breakfast by mouth.   aspirin 81 MG EC tablet Take 1 tablet (81 mg total) daily by mouth.   atorvastatin 40 MG tablet Commonly known as:  LIPITOR Take 1 tablet (40 mg total) daily by mouth.   fenofibrate 160 MG tablet Take 160 mg by mouth every morning.   gabapentin 100 MG capsule Commonly known as:  NEURONTIN Take 100-200 mg at bedtime as needed by mouth (nerve pain).   HAIR/SKIN/NAILS Caps Take 1 tablet daily by  mouth.   PRESERVISION AREDS Tabs Take 1 tablet daily by mouth.   hydrALAZINE 25 MG tablet Commonly known as:  APRESOLINE Take 25 mg daily by mouth.   HYDROcodone-acetaminophen 5-325 MG tablet Commonly known as:  NORCO/VICODIN Take 1-2 tablets by mouth every 6 (six) hours as needed for moderate pain.   loratadine 10 MG tablet Commonly known as:  CLARITIN Take 10 mg daily as needed by mouth (seasonal allergies).   losartan-hydrochlorothiazide 100-25 MG tablet Commonly known as:  HYZAAR Take 1 tablet by mouth daily with breakfast.   sertraline 50 MG tablet Commonly known as:  ZOLOFT Take 50 mg daily by mouth.   TRESIBA FLEXTOUCH 200 UNIT/ML Sopn Generic drug:  Insulin Degludec Inject 80 Units daily with supper into the skin.   VICTOZA 18 MG/3ML Sopn Generic drug:  liraglutide Inject 1.2 mg daily into the skin.      Allergies  Allergen Reactions  . Latex Anaphylaxis and Shortness Of Breath    : severe respiratory distress  . Onion Diarrhea  . Asa [Aspirin] Other (See Comments)    Avoids due to hx gastric ulcer  . Caffeine Other (See Comments)    Stomach pain from pepsi  . Lisinopril Cough  . Metformin And Related Diarrhea  . Simvastatin Rash  . Tetracycline Rash  . Zithromax [Azithromycin] Rash   Follow-up Information    Garvin Fila, MD. Schedule an appointment as soon as possible for a visit in 6 day(s).   Specialties:  Neurology, Radiology Contact information: [address] [place] [address] [phone]            The results of significant diagnostics from this hospitalization (including imaging, microbiology, ancillary and laboratory) are listed below for reference.    Significant Diagnostic Studies: Mr Brain Wo Contrast  Result Date: 10/10/2017 CLINICAL DATA:  Ataxia possible stroke EXAM: MRI HEAD WITHOUT CONTRAST MRA HEAD WITHOUT CONTRAST TECHNIQUE: Multiplanar, multiecho pulse sequences of the brain and surrounding  structures were obtained without intravenous contrast. Angiographic images of the head were obtained using MRA technique without contrast. COMPARISON:  Head CT 10/10/2017 FINDINGS: MRI HEAD FINDINGS Brain: The midline structures are normal. No focal diffusion restriction to indicate acute infarct. No intraparenchymal hemorrhage. There is beginning confluent hyperintense T2-weighted signal within the periventricular and deep white matter, most often seen in the setting of chronic microvascular ischemia. No mass lesion. No chronic microhemorrhage or cerebral amyloid angiopathy. No hydrocephalus, age advanced atrophy or lobar predominant volume loss. No dural abnormality or extra-axial collection. Skull and upper cervical spine: The visualized skull base, calvarium, upper cervical spine and extracranial soft tissues are normal. Sinuses/Orbits: No fluid levels or advanced mucosal thickening. No mastoid effusion. Normal orbits. MRA HEAD FINDINGS Intracranial internal carotid arteries: Normal. Anterior cerebral arteries: Normal. Middle cerebral arteries: Normal. Posterior communicating arteries: Present bilaterally. Posterior cerebral arteries: Normal. Basilar artery: Normal. Vertebral arteries: Left dominant.  Normal. Superior cerebellar arteries: Normal. Anterior inferior cerebellar arteries: Not clearly visible. Posterior inferior cerebellar arteries: Normal. IMPRESSION: 1. Chronic ischemic microangiopathy without acute intracranial abnormality. 2. No emergent large vessel occlusion or high-grade intracranial stenosis. Electronically Signed   By: Ulyses Jarred M.D.   On: 10/10/2017 22:10   Mr Jodene Nam Head Wo Contrast  Result Date: 10/10/2017 CLINICAL DATA:  Ataxia possible stroke EXAM: MRI HEAD WITHOUT CONTRAST MRA HEAD WITHOUT CONTRAST TECHNIQUE: Multiplanar, multiecho pulse sequences of the brain and surrounding structures were obtained without intravenous contrast. Angiographic images of the head were obtained using  MRA technique without contrast. COMPARISON:  Head CT 10/10/2017 FINDINGS: MRI HEAD FINDINGS Brain: The midline structures are normal. No focal diffusion restriction to indicate acute infarct. No intraparenchymal hemorrhage. There is beginning confluent hyperintense T2-weighted signal within the periventricular and deep white matter, most often seen in the setting of chronic microvascular ischemia. No mass lesion. No chronic microhemorrhage or cerebral amyloid angiopathy. No hydrocephalus, age advanced atrophy or lobar predominant volume loss. No dural abnormality or extra-axial collection. Skull and upper cervical spine: The visualized skull base, calvarium, upper cervical spine and extracranial soft tissues are normal. Sinuses/Orbits: No fluid levels or advanced mucosal thickening. No mastoid effusion. Normal orbits. MRA HEAD FINDINGS Intracranial internal carotid arteries: Normal. Anterior cerebral arteries: Normal. Middle cerebral arteries: Normal. Posterior communicating arteries: Present bilaterally. Posterior cerebral arteries: Normal. Basilar artery: Normal. Vertebral arteries: Left dominant. Normal. Superior cerebellar arteries: Normal. Anterior inferior cerebellar arteries: Not clearly visible. Posterior inferior cerebellar arteries: Normal. IMPRESSION: 1. Chronic ischemic microangiopathy without acute intracranial abnormality. 2. No emergent large vessel occlusion or high-grade intracranial stenosis. Electronically Signed   By: Ulyses Jarred M.D.   On: 10/10/2017 22:10   Ct Head Code Stroke Wo Contrast  Result Date: 10/10/2017 CLINICAL DATA:  Code stroke.  Aphasia EXAM: CT HEAD WITHOUT CONTRAST TECHNIQUE: Contiguous axial images were obtained from the base of the skull through the vertex without intravenous contrast. COMPARISON:  Head CT 04/10/2012 FINDINGS: Brain: No mass lesion or acute hemorrhage. No focal hypoattenuation of the basal ganglia or cortex to indicate infarcted tissue. There is  periventricular hypoattenuation compatible with chronic microvascular disease. No hydrocephalus or extra-axial collection. Vascular: No hyperdense vessel. No advanced atherosclerotic calcification of the arteries at the skull base. Skull: Normal visualized skull base, calvarium and extracranial soft tissues. Sinuses/Orbits: No sinus fluid levels or advanced mucosal thickening. No mastoid effusion. Normal orbits. ASPECTS [facility] Stroke Program Early CT Score) - Ganglionic level infarction (caudate, lentiform nuclei, internal capsule, insula, M1-M3 cortex): 7 - Supraganglionic infarction (M4-M6 cortex): 3 Total score (0-10 with 10 being normal): 10 IMPRESSION: 1. No hemorrhage or mass effect. 2. Findings of chronic ischemic microangiopathy without acute abnormality. 3. ASPECTS is 10. These results were called by telephone at the time of interpretation on 10/10/2017 at 4:21 pm to Dr. Roland Rack who verbally acknowledged these results. Electronically Signed   By: Ulyses Jarred M.D.   On: 10/10/2017 16:22    Microbiology: No results found for this or any previous visit (from the past 240 hour(s)).   Labs: Basic Metabolic Panel: No results for input(s): NA, K, CL, CO2, GLUCOSE, BUN, CREATININE, CALCIUM, MG, PHOS in the last 168 hours. Liver Function Tests: No results for input(s): AST, ALT, ALKPHOS, BILITOT, PROT, ALBUMIN in the last 168 hours. No results for input(s): LIPASE, AMYLASE in the last 168 hours. No results for input(s): AMMONIA in the last 168 hours. CBC: No results for input(s): WBC, NEUTROABS, HGB,  HCT, MCV, PLT in the last 168 hours. Cardiac Enzymes: No results for input(s): CKTOTAL, CKMB, CKMBINDEX, TROPONINI in the last 168 hours. BNP: BNP (last 3 results) No results for input(s): BNP in the last 8760 hours.  ProBNP (last 3 results) No results for input(s): PROBNP in the last 8760 hours.  CBG: No results for input(s): GLUCAP in the last 168  hours.     Signed:  Nita Sells MD   [facility] 11/01/2017, 5:11 PM

## 2017-11-08 DIAGNOSIS — Z79899 Other long term (current) drug therapy: Secondary | ICD-10-CM | POA: Diagnosis not present

## 2017-11-08 DIAGNOSIS — I10 Essential (primary) hypertension: Secondary | ICD-10-CM | POA: Diagnosis not present

## 2017-11-08 DIAGNOSIS — E039 Hypothyroidism, unspecified: Secondary | ICD-10-CM | POA: Diagnosis not present

## 2017-11-10 DIAGNOSIS — I10 Essential (primary) hypertension: Secondary | ICD-10-CM | POA: Diagnosis not present

## 2017-11-10 DIAGNOSIS — E0590 Thyrotoxicosis, unspecified without thyrotoxic crisis or storm: Secondary | ICD-10-CM | POA: Diagnosis not present

## 2017-11-10 DIAGNOSIS — M19019 Primary osteoarthritis, unspecified shoulder: Secondary | ICD-10-CM | POA: Diagnosis not present

## 2017-11-10 DIAGNOSIS — Z6827 Body mass index (BMI) 27.0-27.9, adult: Secondary | ICD-10-CM | POA: Diagnosis not present

## 2017-11-15 DIAGNOSIS — E1121 Type 2 diabetes mellitus with diabetic nephropathy: Secondary | ICD-10-CM | POA: Diagnosis not present

## 2017-11-15 DIAGNOSIS — E1165 Type 2 diabetes mellitus with hyperglycemia: Secondary | ICD-10-CM | POA: Diagnosis not present

## 2017-11-15 DIAGNOSIS — E1140 Type 2 diabetes mellitus with diabetic neuropathy, unspecified: Secondary | ICD-10-CM | POA: Diagnosis not present

## 2017-11-15 DIAGNOSIS — I10 Essential (primary) hypertension: Secondary | ICD-10-CM | POA: Diagnosis not present

## 2017-12-06 DIAGNOSIS — N289 Disorder of kidney and ureter, unspecified: Secondary | ICD-10-CM | POA: Diagnosis not present

## 2017-12-06 DIAGNOSIS — I10 Essential (primary) hypertension: Secondary | ICD-10-CM | POA: Diagnosis not present

## 2017-12-09 DIAGNOSIS — N289 Disorder of kidney and ureter, unspecified: Secondary | ICD-10-CM | POA: Diagnosis not present

## 2017-12-09 DIAGNOSIS — I10 Essential (primary) hypertension: Secondary | ICD-10-CM | POA: Diagnosis not present

## 2017-12-09 DIAGNOSIS — E1165 Type 2 diabetes mellitus with hyperglycemia: Secondary | ICD-10-CM | POA: Diagnosis not present

## 2017-12-09 DIAGNOSIS — E782 Mixed hyperlipidemia: Secondary | ICD-10-CM | POA: Diagnosis not present

## 2017-12-26 ENCOUNTER — Other Ambulatory Visit: Payer: Self-pay

## 2017-12-26 ENCOUNTER — Ambulatory Visit (INDEPENDENT_AMBULATORY_CARE_PROVIDER_SITE_OTHER): Payer: Medicare Other | Admitting: Neurology

## 2017-12-26 ENCOUNTER — Encounter: Payer: Self-pay | Admitting: Neurology

## 2017-12-26 VITALS — BP 145/75 | HR 81 | Wt 158.2 lb

## 2017-12-26 DIAGNOSIS — G459 Transient cerebral ischemic attack, unspecified: Secondary | ICD-10-CM

## 2017-12-26 MED ORDER — COQ10 200 MG PO CAPS: 1.0000 | ORAL_CAPSULE | ORAL | 3 refills | Status: AC

## 2017-12-26 MED ORDER — COQ10 200 MG PO CAPS: 1.0000 | ORAL_CAPSULE | ORAL | 3 refills | Status: DC

## 2017-12-26 NOTE — Patient Instructions (Signed)
I had a long d/w patient and her daughter about her recent TIA, risk for recurrent stroke/TIAs, personally independently reviewed imaging studies and stroke evaluation results and answered questions.Continue aspirin 81 mg daily  for secondary stroke prevention and maintain strict control of hypertension with blood pressure goal below 130/90, diabetes with hemoglobin A1c goal below 6.5% and lipids with LDL cholesterol goal below 70 mg/dL. I also advised the patient to eat a healthy diet with plenty of whole grains, cereals, fruits and vegetables, exercise regularly and maintain ideal body weight .I recommend she start taking CoQ10 200 mg daily to help with her statin myalgias. Followup in the future with my nurse practitioner Janett Billow in 6 months or call earlier if necessary   Stroke Prevention Some medical conditions and behaviors are associated with a higher chance of having a stroke. You can help prevent a stroke by making nutrition, lifestyle, and other changes, including managing any medical conditions you may have. What nutrition changes can be made?  Eat healthy foods. You can do this by: ? Choosing foods high in fiber, such as fresh fruits and vegetables and whole grains. ? Eating at least 5 or more servings of fruits and vegetables a day. Try to fill half of your plate at each meal with fruits and vegetables. ? Choosing lean protein foods, such as lean cuts of meat, poultry without skin, fish, tofu, beans, and nuts. ? Eating low-fat dairy products. ? Avoiding foods that are high in salt (sodium). This can help lower blood pressure. ? Avoiding foods that have saturated fat, trans fat, and cholesterol. This can help prevent high cholesterol. ? Avoiding processed and premade foods.  Follow your health care provider's specific guidelines for losing weight, controlling high blood pressure (hypertension), lowering high cholesterol, and managing diabetes. These may include: ? Reducing your daily  calorie intake. ? Limiting your daily sodium intake to 1,500 milligrams (mg). ? Using only healthy fats for cooking, such as olive oil, canola oil, or sunflower oil. ? Counting your daily carbohydrate intake. What lifestyle changes can be made?  Maintain a healthy weight. Talk to your health care provider about your ideal weight.  Get at least 30 minutes of moderate physical activity at least 5 days a week. Moderate activity includes brisk walking, biking, and swimming.  Do not use any products that contain nicotine or tobacco, such as cigarettes and e-cigarettes. If you need help quitting, ask your health care provider. It may also be helpful to avoid exposure to secondhand smoke.  Limit alcohol intake to no more than 1 drink a day for nonpregnant women and 2 drinks a day for men. One drink equals 12 oz of beer, 5 oz of wine, or 1 oz of hard liquor.  Stop any illegal drug use.  Avoid taking birth control pills. Talk to your health care provider about the risks of taking birth control pills if: ? You are over 26 years old. ? You smoke. ? You get migraines. ? You have ever had a blood clot. What other changes can be made?  Manage your cholesterol levels. ? Eating a healthy diet is important for preventing high cholesterol. If cholesterol cannot be managed through diet alone, you may also need to take medicines. ? Take any prescribed medicines to control your cholesterol as told by your health care provider.  Manage your diabetes. ? Eating a healthy diet and exercising regularly are important parts of managing your blood sugar. If your blood sugar cannot be managed through diet  and exercise, you may need to take medicines. ? Take any prescribed medicines to control your diabetes as told by your health care provider.  Control your hypertension. ? To reduce your risk of stroke, try to keep your blood pressure below 130/80. ? Eating a healthy diet and exercising regularly are an  important part of controlling your blood pressure. If your blood pressure cannot be managed through diet and exercise, you may need to take medicines. ? Take any prescribed medicines to control hypertension as told by your health care provider. ? Ask your health care provider if you should monitor your blood pressure at home. ? Have your blood pressure checked every year, even if your blood pressure is normal. Blood pressure increases with age and some medical conditions.  Get evaluated for sleep disorders (sleep apnea). Talk to your health care provider about getting a sleep evaluation if you snore a lot or have excessive sleepiness.  Take over-the-counter and prescription medicines only as told by your health care provider. Aspirin or blood thinners (antiplatelets or anticoagulants) may be recommended to reduce your risk of forming blood clots that can lead to stroke.  Make sure that any other medical conditions you have, such as atrial fibrillation or atherosclerosis, are managed. What are the warning signs of a stroke? The warning signs of a stroke can be easily remembered as BEFAST.  B is for balance. Signs include: ? Dizziness. ? Loss of balance or coordination. ? Sudden trouble walking.  E is for eyes. Signs include: ? A sudden change in vision. ? Trouble seeing.  F is for face. Signs include: ? Sudden weakness or numbness of the face. ? The face or eyelid drooping to one side.  A is for arms. Signs include: ? Sudden weakness or numbness of the arm, usually on one side of the body.  S is for speech. Signs include: ? Trouble speaking (aphasia). ? Trouble understanding.  T is for time. ? These symptoms may represent a serious problem that is an emergency. Do not wait to see if the symptoms will go away. Get medical help right away. Call your local emergency services (911 in the [place] Do not drive yourself to the hospital.  Other signs of stroke may include: ? A sudden,  severe headache with no known cause. ? Nausea or vomiting. ? Seizure.  Where to find more information: For more information, visit:  American Stroke Association: www.strokeassociation.org  National Stroke Association: www.stroke.org  Summary  You can prevent a stroke by eating healthy, exercising, not smoking, limiting alcohol intake, and managing any medical conditions you may have.  Do not use any products that contain nicotine or tobacco, such as cigarettes and e-cigarettes. If you need help quitting, ask your health care provider. It may also be helpful to avoid exposure to secondhand smoke.  Remember BEFAST for warning signs of stroke. Get help right away if you or a loved one has any of these signs. This information is not intended to replace advice given to you by your health care provider. Make sure you discuss any questions you have with your health care provider. Document Released: 12/23/2004 Document Revised: 12/21/2016 Document Reviewed: 12/21/2016 Elsevier Interactive Patient Education  [employer].

## 2017-12-26 NOTE — Progress Notes (Signed)
[facility] [address] [place]. [place] [address] [phone]       OFFICE FOLLOW-UP NOTE  Diana. Diana Harper Date of Birth:  [birth year] Medical Record Number:  [id number]   HPI: Diana Harper is a pleasant 51 year African-American lady seen today for first office follow-up visit following hospital admission for TIA in 10/18/1917. She is accompanied by her daughter Diana Harper. History is obtained from them and review of electronic medical records. I have personally reviewed imaging films.Diana Harper is an 82 y.o. female presenting to the ED after noted to be normal at [address].  She went to her PCP and was noted to have expressive difficulties.  She states that she went to use her right hand to sign in, and the right arm was not working properly.  She states that she could not make it do what it was supposed to do.  Currently she still has some slow and methodical speech and appears anxious. Currently no other findings have been noted. Date last known well: Date: 10/10/2017.Time last known well: Time: 13:15.tPA Given: No: minimal symptoms MRI scan of the brain showed age-related changes of small vessel disease. No acute findings or infarct. CT scan head  showed no acute abnormalities. Echocardiogram and carotid ultrasound were unremarkable. LDL cholesterol was elevated at 96 mg percent and hemoglobin A1c was 7.9. Patient was started on aspirin for stroke prevention and Lipitor. She states she's done well since discharge without recurrent TIA or speech disturbance. She is tolerating aspirin well without bleeding or bruising or upset stomach. She states her blood pressure at home usually runs in the 120s but today it is elevated in office at 145/75. Her blood sugars have been fluctuating but fasting sugar ranging in the 1 20-1 30 range. She had trouble tolerating Lipitor due to muscle aches and cramps in her primary care physician has reduced the dose. She has chronic back pain and uses a  cane to walk. She has not been able to lose any weight. Though she states she is eating healthy ROS:   14 system review of systems is positive for  fatigue, hearing loss, ringing in the ears, eye itching, shortness of breath, murmur, cold intolerance, abdominal pain, constipation, restless legs, food allergies, urgency, joint and back pain, aching muscles and cramps, walking difficulty, skin moles, memory loss, dizziness, numbness, weakness, tremors, passing out, depression, anxiety and all systems negative  PMH:  Past Medical History:  Diagnosis Date  . Anemia   . Anxiety   . Arthritis   . Biliary colic   . Cholelithiasis   . CKD (chronic kidney disease), stage III [facility])    nephrologist--  dr Meredeth Ide  . Diverticulosis of colon   . History of asthma    1980's  no longer problem since 1980's  . History of diverticulitis of colon    recurrent--  2014;  2013;  2012  . Hyperlipidemia   . Hypertension   . Insulin dependent type 2 diabetes mellitus ([place])    followed by dr Jenny Reichmann lambeth ([facility])   . Murmur   . Peripheral neuropathy   . Vitamin D deficiency   . Wears hearing aid    bilateral  . Wears partial dentures    lower partial and upper full    Social History:  Social History   Socioeconomic History  . Marital status: Widowed    Spouse name: Not on file  . Number of children: Not on file  . Years of education: Not on  file  . Highest education level: Not on file  Social Needs  . Financial resource strain: Not on file  . Food insecurity - worry: Not on file  . Food insecurity - inability: Not on file  . Transportation needs - medical: Not on file  . Transportation needs - non-medical: Not on file  Occupational History  . Not on file  Tobacco Use  . Smoking status: Never Smoker  . Smokeless tobacco: Never Used  Substance and Sexual Activity  . Alcohol use: No  . Drug use: No  . Sexual activity: Not on file  Other Topics Concern  . Not on file  Social History  Narrative  . Not on file    Medications:   Current Outpatient Medications on File Prior to Visit  Medication Sig Dispense Refill  . acetaminophen (TYLENOL) 500 MG tablet Take 1,000 mg by mouth every 6 (six) hours as needed for mild pain, moderate pain, fever or headache.    [name] amLODipine (NORVASC) 5 MG tablet Take 5 mg daily with breakfast by mouth.     [name] aspirin EC 81 MG EC tablet Take 1 tablet (81 mg total) daily by mouth. 30 tablet 12  . atorvastatin (LIPITOR) 40 MG tablet Take 1 tablet (40 mg total) daily by mouth. (Patient taking differently: Take 20 mg by mouth daily. ) 30 tablet 12  . fenofibrate 160 MG tablet Take 160 mg by mouth every morning.     . gabapentin (NEURONTIN) 100 MG capsule Take 100-200 mg at bedtime as needed by mouth (nerve pain).     . hydrALAZINE (APRESOLINE) 25 MG tablet Take 25 mg daily by mouth.     [name] HYDROcodone-acetaminophen (NORCO/VICODIN) 5-325 MG tablet Take 1-2 tablets by mouth every 6 (six) hours as needed for moderate pain. 30 tablet 0  . Insulin Degludec (TRESIBA FLEXTOUCH) 200 UNIT/ML SOPN Inject 80 Units daily with supper into the skin.    [name] liraglutide (VICTOZA) 18 MG/3ML SOPN Inject 1.2 mg daily into the skin.    [name] loratadine (CLARITIN) 10 MG tablet Take 10 mg daily as needed by mouth (seasonal allergies).     . losartan-hydrochlorothiazide (HYZAAR) 100-25 MG tablet Take 1 tablet by mouth daily with breakfast.     . Multiple Vitamins-Minerals (HAIR/SKIN/NAILS) CAPS Take 1 tablet daily by mouth.     . Multiple Vitamins-Minerals (PRESERVISION AREDS) TABS Take 1 tablet daily by mouth.    . sertraline (ZOLOFT) 50 MG tablet Take 50 mg daily by mouth.      No current facility-administered medications on file prior to visit.     Allergies:   Allergies  Allergen Reactions  . Latex Anaphylaxis and Shortness Of Breath    : severe respiratory distress  . Onion Diarrhea  . Asa [Aspirin] Other (See Comments)    Avoids due to hx gastric ulcer  . Caffeine  Other (See Comments)    Stomach pain from pepsi  . Lisinopril Cough  . Metformin And Related Diarrhea  . Simvastatin Rash  . Tetracycline Rash  . Zithromax [Azithromycin] Rash    Physical Exam General: well developed, well nourished elderly African-American lady, seated, in no evident distress Head: head normocephalic and atraumatic.  Neck: supple with no carotid or supraclavicular bruits Cardiovascular: regular rate and rhythm, no murmurs Musculoskeletal: no deformity Skin:  no rash/petichiae Vascular:  Normal pulses all extremities Vitals:   12/26/17 1225  BP: (!) 145/75  Pulse: 81   Neurologic Exam Mental Status: Awake and fully alert. Oriented  to place and time. Recent and remote memory intact. Attention span, concentration and fund of knowledge appropriate. Mood and affect appropriate.  Cranial Nerves: Fundoscopic exam reveals sharp disc margins. Pupils equal, briskly reactive to light. Extraocular movements full without nystagmus. Visual fields full to confrontation. Hearing intact. Facial sensation intact. Face, tongue, palate moves normally and symmetrically.  Motor: Normal bulk and tone. Normal strength in all tested extremity muscles. Sensory.: intact to touch ,pinprick .position and vibratory sensation.  Coordination: Rapid alternating movements normal in all extremities. Finger-to-nose and heel-to-shin performed accurately bilaterally. Gait and Station: Use a cane. Arises from chair without difficulty. Stance is antalgic with favoring her back.   Peri Jefferson to heel, toe and tandem walk  .  Reflexes: 1+ and symmetric. Toes downgoing.   NIHSS  0 Modified Rankin  1   ASSESSMENT: 83 year African-American lady with 2 episodes of transient speech and word finding difficulties likely left hemispheric TIA from small vessel disease in October and November 2018. Vascular risk factors of diabetes, hypertension, hyperlipidemia and age    PLAN: I had a long d/w patient and her  daughter about her recent TIA, risk for recurrent stroke/TIAs, personally independently reviewed imaging studies and stroke evaluation results and answered questions.Continue aspirin 81 mg daily  for secondary stroke prevention and maintain strict control of hypertension with blood pressure goal below 130/90, diabetes with hemoglobin A1c goal below 6.5% and lipids with LDL cholesterol goal below 70 mg/dL. I also advised the patient to eat a healthy diet with plenty of whole grains, cereals, fruits and vegetables, exercise regularly and maintain ideal body weight .I recommend she start taking CoQ10 200 mg daily to help with her statin myalgias. Followup in the future with my nurse practitioner Janett Billow in 6 months or call earlier if necessary Greater than 50% of time during this 25 minute visit was spent on counseling,explanation of diagnosis, planning of further management, discussion with patient and family and coordination of care Antony Contras, MD  [facility] [address] [place], [place] [address]  Phone [phone] Fax [phone] Note: This document was prepared with digital dictation and possible smart phrase technology. Any transcriptional errors that result from this process are unintentional

## 2018-01-23 DIAGNOSIS — H04412 Chronic dacryocystitis of left lacrimal passage: Secondary | ICD-10-CM | POA: Diagnosis not present

## 2018-01-23 DIAGNOSIS — Z961 Presence of intraocular lens: Secondary | ICD-10-CM | POA: Diagnosis not present

## 2018-01-30 IMAGING — CT CT HEAD CODE STROKE
4 series · 15 of 47 positions shown, 17 images · non-contrast
Comparison: Head CT 04/10/2012

CLINICAL DATA: Code stroke.  Aphasia

EXAM:
CT HEAD WITHOUT CONTRAST
TECHNIQUE: Contiguous axial images were obtained from the base of the skull
through the vertex without intravenous contrast.

[Series 3: head wo · axial · 0.39mm/px · z∈[+780,+886]mm · 7 of 29 slices shown, 9 images]
[im 4/29  brain]
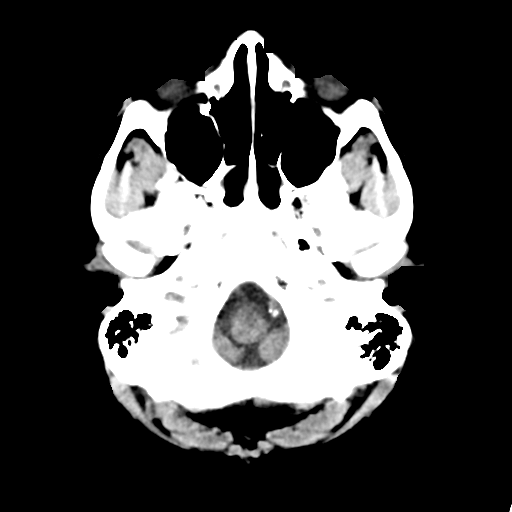
[im 4/29  bone]
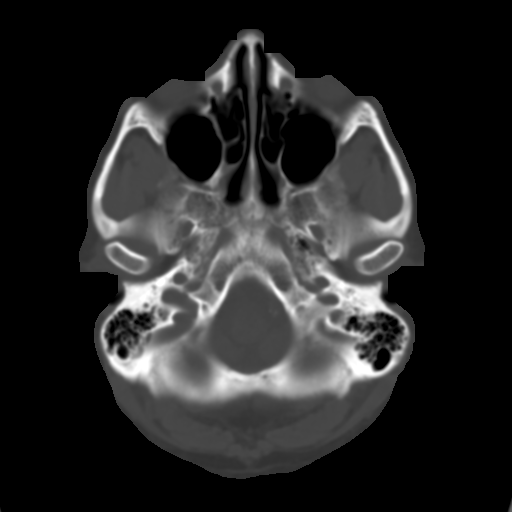
[im 8/29  brain]
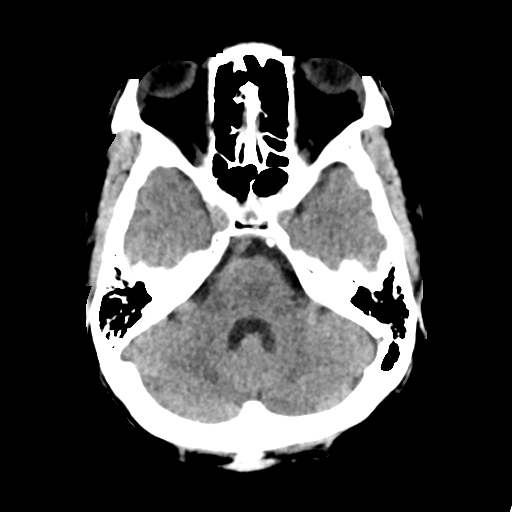
[im 11/29  brain]
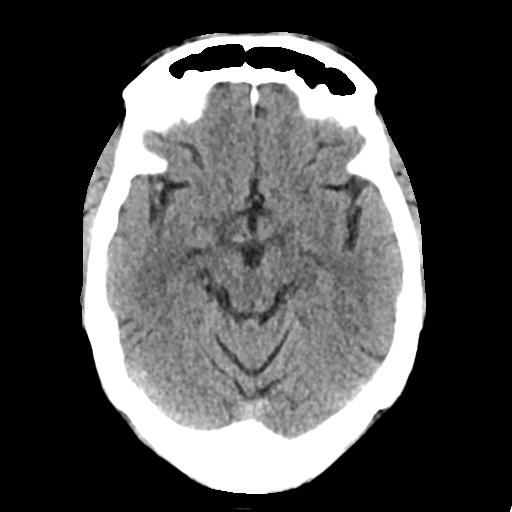
[im 15/29  brain]
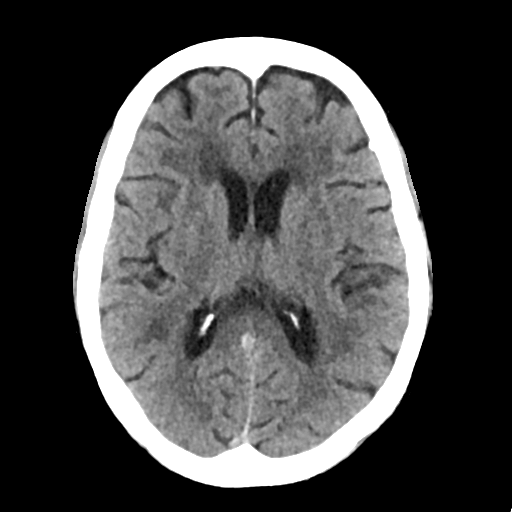
[im 18/29  brain]
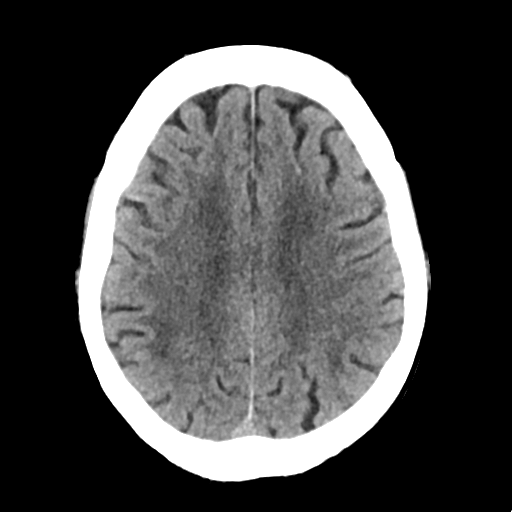
[im 18/29  bone]
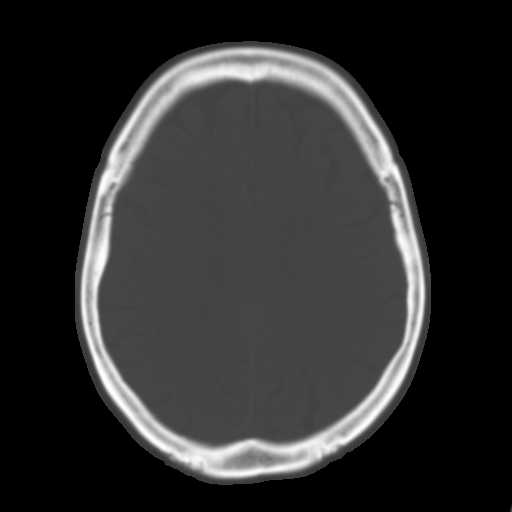
[im 22/29  brain]
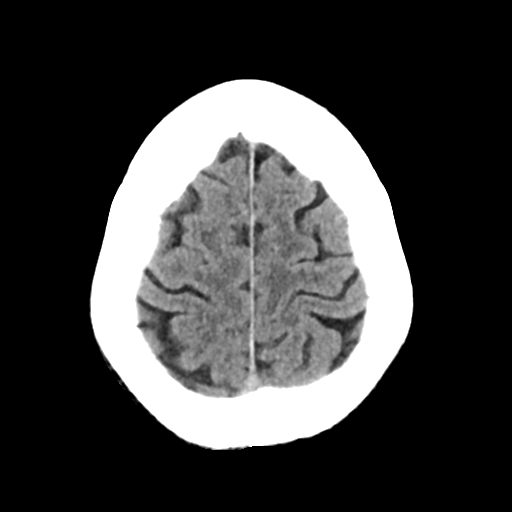
[im 25/29  brain]
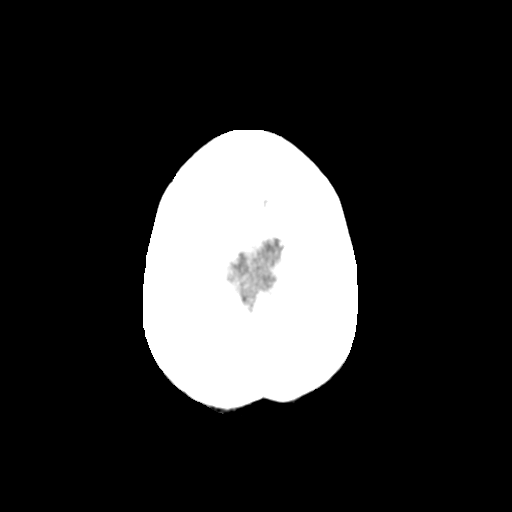

[Series 4: head bone · axial · 0.39mm/px · z∈[+778,+792]mm · 2 of 70 slices shown]
[im 7/70  bone]
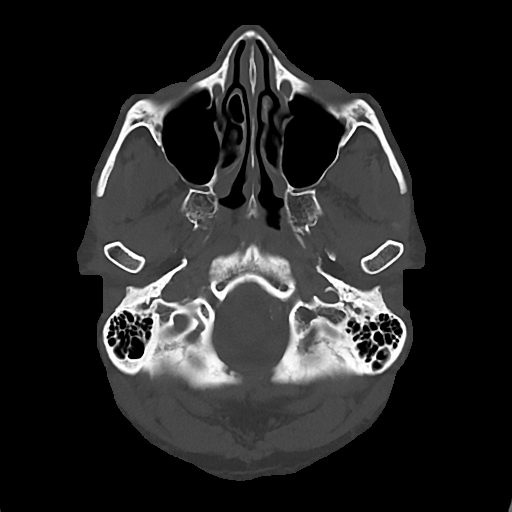
[im 14/70  bone]
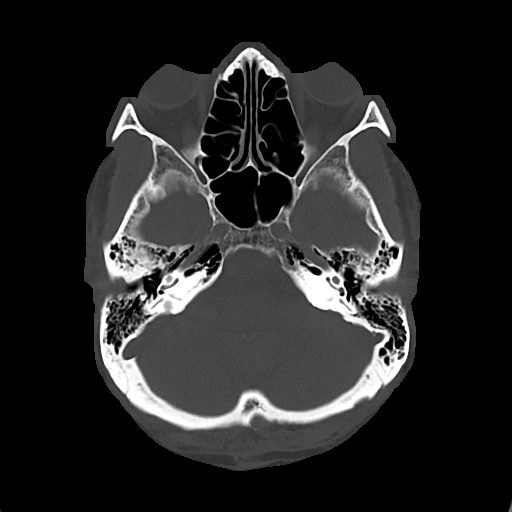

[Series 5: cor soft · coronal · 0.28mm/px · 3 of 67 slices shown]
[im 23/67  brain]
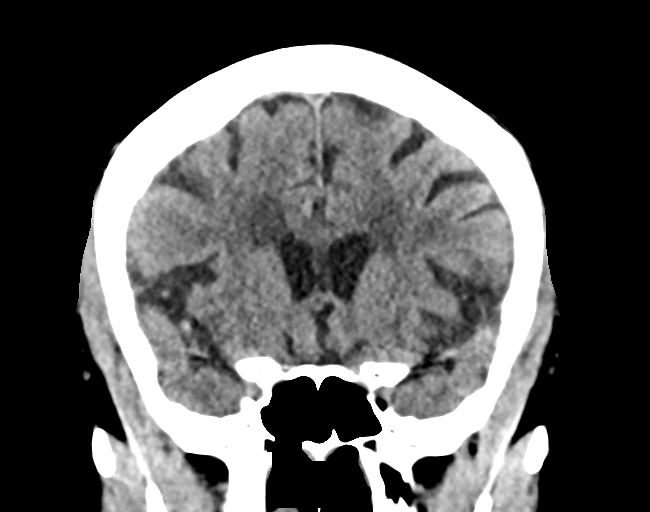
[im 30/67  brain]
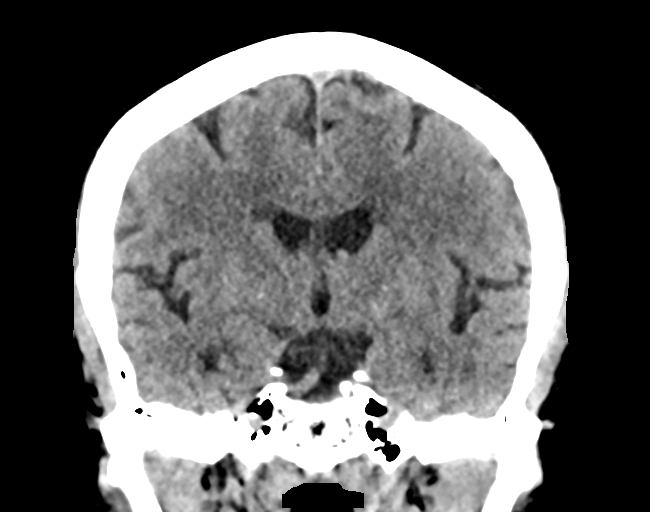
[im 37/67  brain]
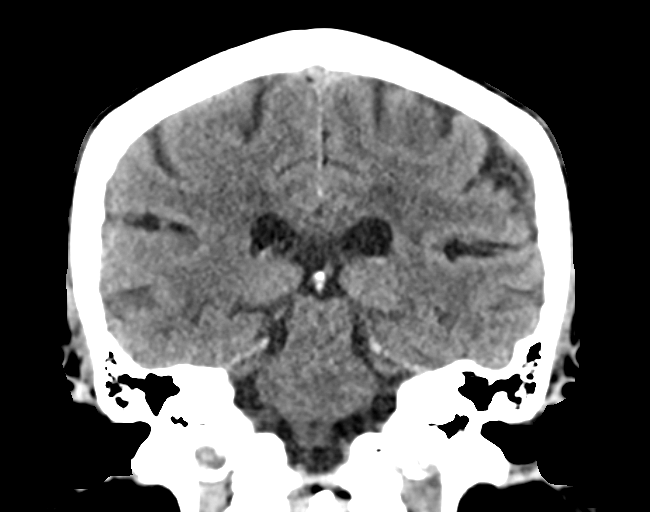

[Series 6: sag soft · sagittal · 0.27mm/px · 3 of 53 slices shown]
[im 18/53  brain]
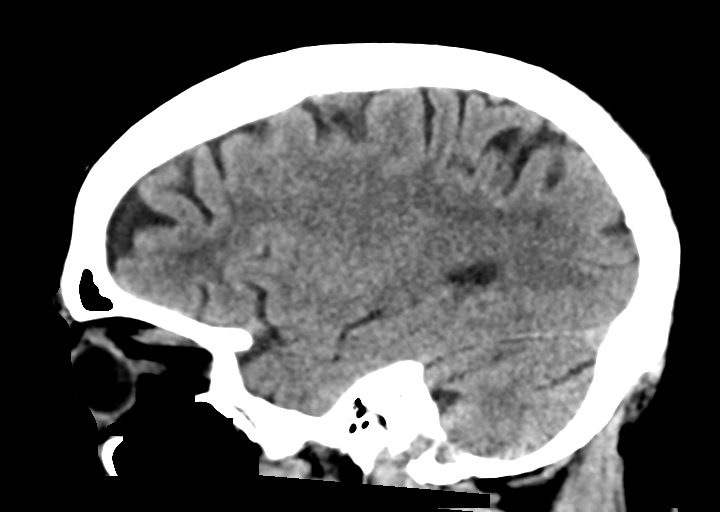
[im 27/53  brain]
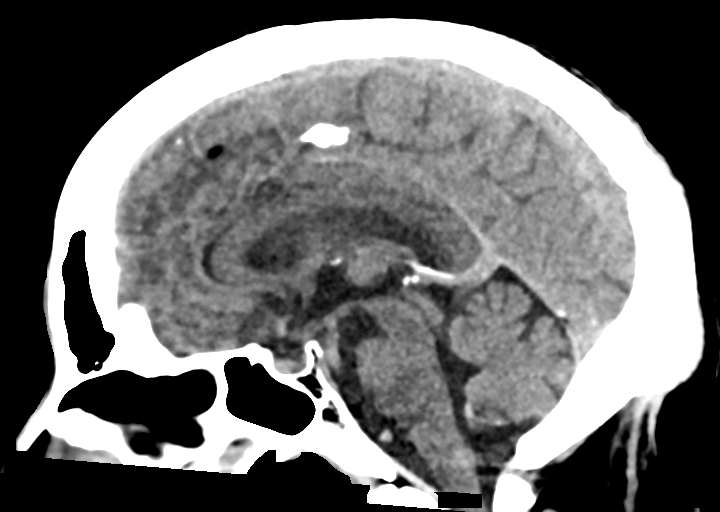
[im 35/53  brain]
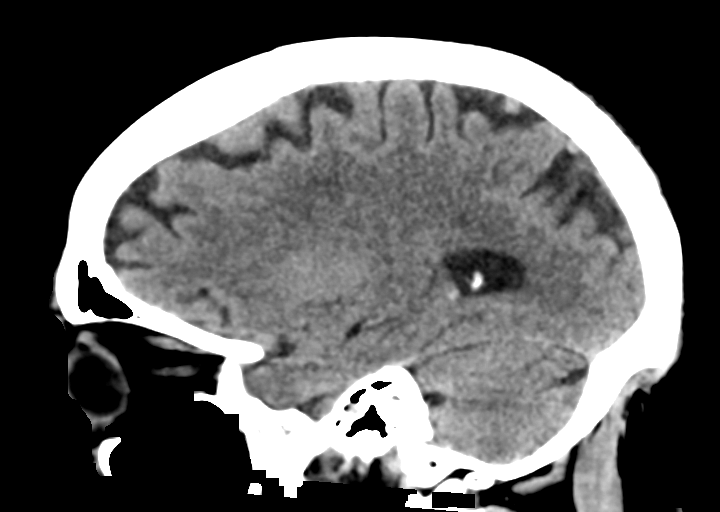

[15 of 47 positions shown; findings below may reference images not displayed]

FINDINGS: Brain: No mass lesion or acute hemorrhage. No focal hypoattenuation
of the basal ganglia or cortex to indicate infarcted tissue. There
is periventricular hypoattenuation compatible with chronic
microvascular disease. No hydrocephalus or extra-axial collection.

Vascular: No hyperdense vessel. No advanced atherosclerotic
calcification of the arteries at the skull base.

Skull: Normal visualized skull base, calvarium and extracranial soft
tissues.

Sinuses/Orbits: No sinus fluid levels or advanced mucosal
thickening. No mastoid effusion. Normal orbits.

ASPECTS (Alberta Stroke Program Early CT Score)

- Ganglionic level infarction (caudate, lentiform nuclei, internal
capsule, insula, M1-M3 cortex): 7

- Supraganglionic infarction (M4-M6 cortex): 3

Total score (0-10 with 10 being normal): 10
IMPRESSION: 1. No hemorrhage or mass effect.
2. Findings of chronic ischemic microangiopathy without acute
abnormality.
3. ASPECTS is 10.
These results were called by telephone at the time of interpretation
on 10/10/2017 at [DATE] to Dr. MEDHAT DAM who verbally
acknowledged these results.

## 2018-02-13 DIAGNOSIS — Z794 Long term (current) use of insulin: Secondary | ICD-10-CM | POA: Diagnosis not present

## 2018-02-13 DIAGNOSIS — I10 Essential (primary) hypertension: Secondary | ICD-10-CM | POA: Diagnosis not present

## 2018-02-13 DIAGNOSIS — E1121 Type 2 diabetes mellitus with diabetic nephropathy: Secondary | ICD-10-CM | POA: Diagnosis not present

## 2018-02-13 DIAGNOSIS — E1165 Type 2 diabetes mellitus with hyperglycemia: Secondary | ICD-10-CM | POA: Diagnosis not present

## 2018-04-05 DIAGNOSIS — R252 Cramp and spasm: Secondary | ICD-10-CM | POA: Diagnosis not present

## 2018-04-05 DIAGNOSIS — E559 Vitamin D deficiency, unspecified: Secondary | ICD-10-CM | POA: Diagnosis not present

## 2018-04-05 DIAGNOSIS — E782 Mixed hyperlipidemia: Secondary | ICD-10-CM | POA: Diagnosis not present

## 2018-04-05 DIAGNOSIS — Z79899 Other long term (current) drug therapy: Secondary | ICD-10-CM | POA: Diagnosis not present

## 2018-04-05 DIAGNOSIS — I10 Essential (primary) hypertension: Secondary | ICD-10-CM | POA: Diagnosis not present

## 2018-04-07 DIAGNOSIS — E782 Mixed hyperlipidemia: Secondary | ICD-10-CM | POA: Diagnosis not present

## 2018-04-07 DIAGNOSIS — Z6826 Body mass index (BMI) 26.0-26.9, adult: Secondary | ICD-10-CM | POA: Diagnosis not present

## 2018-04-07 DIAGNOSIS — N289 Disorder of kidney and ureter, unspecified: Secondary | ICD-10-CM | POA: Diagnosis not present

## 2018-04-07 DIAGNOSIS — I10 Essential (primary) hypertension: Secondary | ICD-10-CM | POA: Diagnosis not present

## 2018-04-07 DIAGNOSIS — F330 Major depressive disorder, recurrent, mild: Secondary | ICD-10-CM | POA: Diagnosis not present

## 2018-05-23 DIAGNOSIS — E1121 Type 2 diabetes mellitus with diabetic nephropathy: Secondary | ICD-10-CM | POA: Diagnosis not present

## 2018-05-23 DIAGNOSIS — E1165 Type 2 diabetes mellitus with hyperglycemia: Secondary | ICD-10-CM | POA: Diagnosis not present

## 2018-05-23 DIAGNOSIS — E1140 Type 2 diabetes mellitus with diabetic neuropathy, unspecified: Secondary | ICD-10-CM | POA: Diagnosis not present

## 2018-05-23 DIAGNOSIS — Z794 Long term (current) use of insulin: Secondary | ICD-10-CM | POA: Diagnosis not present

## 2018-06-23 NOTE — Progress Notes (Signed)
[facility] [address] [place]. [place] [address] [phone]       OFFICE FOLLOW-UP NOTE  Diana. Diana Harper Date of Birth:  07/25/1936 Medical Record Number:  [id number]   Diana Harper is a pleasant 82 year African-American lady seen today for follow-up visit for TIA in 10/19/2079. She is accompanied by her daughter [name]. History is obtained from them and review of electronic medical records. I have personally reviewed imaging films.  HPI: Aleathea Pugmire is an 82 y.o. female presenting to the ED after noted to be normal at [address].  She went to her PCP and was noted to have expressive difficulties.  She states that she went to use her right hand to sign in, and the right arm was not working properly.  She states that she could not make it do what it was supposed to do.  Currently she still has some slow and methodical speech and appears anxious. Currently no other findings have been noted. Date last known well: Date: 10/10/2017.Time last known well: Time: 13:15.tPA Given: No: minimal symptoms. MRI scan of the brain showed age-related changes of small vessel disease. No acute findings or infarct. CT scan head  showed no acute abnormalities. Echocardiogram and carotid ultrasound were unremarkable. LDL cholesterol was elevated at 96 mg percent and hemoglobin A1c was 7.9. Patient was started on aspirin for stroke prevention and Lipitor.   12/26/17 visit PS: She states she's done well since discharge without recurrent TIA or speech disturbance. She is tolerating aspirin well without bleeding or bruising or upset stomach. She states her blood pressure at home usually runs in the 120s but today it is elevated in office at 145/75. Her blood sugars have been fluctuating but fasting sugar ranging in the 1 20-1 30 range. She had trouble tolerating Lipitor due to muscle aches and cramps in her primary care physician has reduced the dose. She has chronic back pain and uses a cane to walk. She  has not been able to lose any weight. Though she states she is eating healthy  06/23/18 UPDATE: Patient is being seen today for routine follow-up appointment and is accompanied by her daughter.  From a TIA standpoint, patient has been doing well.  She continues to take aspirin without bleeding or bruising.  Continues to take Lipitor without side effects myalgias.  Patient does monitor glucose levels at home and she states recently they have been "good" and this is managed by endocrinology.  Blood pressure today mildly low at 106/72 and his patient does continue to monitor this at home typically range 120/60 to 70s.  Patient does have ongoing balance issues for which she uses a cane for.  She states she can start having balance/dizziness episodes that lasts approximately 2 days and then will resolve and this happens once every couple months.  Patient also states that she has been having vasovagal responses while having bowel movements and has occurred approximately 3-4 times since previous visit.  She has been seen in ED for these as well as being managed by PCP.  Patient has does have a history of GI issues such as diverticulitis and was encouraged to follow-up with GI specialist.  Continues to live with daughter but is able to continue all ADLs independently.  Denies new or worsening stroke/TIA symptoms.    ROS:   14 system review of systems is positive for appetite change, fatigue, hearing loss, murmur, cold intolerance, abdominal pain, diarrhea, restless leg, snoring, food allergies, urgency, neck pain, aching  muscles, muscle cramps, walking difficulty, moles, memory loss, dizziness, weakness, tremors, passing out, agitation, confusion, and depression  and all systems negative  PMH:  Past Medical History:  Diagnosis Date  . Anemia   . Anxiety   . Arthritis   . Biliary colic   . Cholelithiasis   . CKD (chronic kidney disease), stage III [facility])    nephrologist--  dr Meredeth Ide  . Diverticulosis of  colon   . History of asthma    1980's  no longer problem since 1980's  . History of diverticulitis of colon    recurrent--  2014;  2013;  2012  . Hyperlipidemia   . Hypertension   . Insulin dependent type 2 diabetes mellitus ([place])    followed by dr Jenny Reichmann lambeth ([facility])   . Murmur   . Peripheral neuropathy   . Stroke ([place])   . Vitamin D deficiency   . Wears hearing aid    bilateral  . Wears partial dentures    lower partial and upper full    Social History:  Social History   Socioeconomic History  . Marital status: Widowed    Spouse name: Not on file  . Number of children: Not on file  . Years of education: Not on file  . Highest education level: Not on file  Occupational History  . Not on file  Social Needs  . Financial resource strain: Not on file  . Food insecurity:    Worry: Not on file    Inability: Not on file  . Transportation needs:    Medical: Not on file    Non-medical: Not on file  Tobacco Use  . Smoking status: Never Smoker  . Smokeless tobacco: Never Used  Substance and Sexual Activity  . Alcohol use: No  . Drug use: No  . Sexual activity: Not on file  Lifestyle  . Physical activity:    Days per week: Not on file    Minutes per session: Not on file  . Stress: Not on file  Relationships  . Social connections:    Talks on phone: Not on file    Gets together: Not on file    Attends religious service: Not on file    Active member of club or organization: Not on file    Attends meetings of clubs or organizations: Not on file    Relationship status: Not on file  . Intimate partner violence:    Fear of current or ex partner: Not on file    Emotionally abused: Not on file    Physically abused: Not on file    Forced sexual activity: Not on file  Other Topics Concern  . Not on file  Social History Narrative  . Not on file    Medications:   Current Outpatient Medications on File Prior to Visit  Medication Sig Dispense Refill  . acetaminophen  (TYLENOL) 500 MG tablet Take 1,000 mg by mouth every 6 (six) hours as needed for mild pain, moderate pain, fever or headache.    [name] amLODipine (NORVASC) 5 MG tablet Take 5 mg daily with breakfast by mouth.     [name] aspirin 81 MG EC tablet Take by mouth.    [name] atorvastatin (LIPITOR) 40 MG tablet Take 1 tablet (40 mg total) daily by mouth. (Patient taking differently: Take 20 mg by mouth daily. ) 30 tablet 12  . Cholecalciferol 1000 units capsule Take 5,000 Units by mouth.     . gabapentin (NEURONTIN) 100 MG capsule Take  100-200 mg at bedtime as needed by mouth (nerve pain).     . hydrALAZINE (APRESOLINE) 25 MG tablet Take 25 mg daily by mouth.     [name] HYDROcodone-acetaminophen (NORCO/VICODIN) 5-325 MG tablet Take 1-2 tablets by mouth every 6 (six) hours as needed for moderate pain. 30 tablet 0  . Insulin Degludec (TRESIBA FLEXTOUCH) 200 UNIT/ML SOPN Inject 80 Units daily with supper into the skin.    [name] liraglutide (VICTOZA) 18 MG/3ML SOPN Inject 1.2 mg daily into the skin.    [name] loratadine (CLARITIN) 10 MG tablet Take 10 mg daily as needed by mouth (seasonal allergies).     . losartan-hydrochlorothiazide (HYZAAR) 100-25 MG tablet Take 1 tablet by mouth daily with breakfast.     . sertraline (ZOLOFT) 50 MG tablet Take 50 mg daily by mouth.      No current facility-administered medications on file prior to visit.     Allergies:   Allergies  Allergen Reactions  . Latex Anaphylaxis and Shortness Of Breath    : severe respiratory distress  . Onion Diarrhea  . Caffeine Other (See Comments)    Stomach pain from pepsi  . Lisinopril Cough  . Metformin And Related Diarrhea  . Simvastatin Rash  . Tetracycline Rash  . Zithromax [Azithromycin] Rash    Physical Exam General: well developed, well nourished elderly African-American lady, seated, in no evident distress Head: head normocephalic and atraumatic.  Neck: supple with no carotid or supraclavicular bruits Cardiovascular: regular rate and rhythm,  no murmurs Musculoskeletal: no deformity Skin:  no rash/petichiae Vascular:  Normal pulses all extremities Vitals:   06/26/18 1317  BP: 106/72  Pulse: 77   Neurologic Exam Mental Status: Awake and fully alert. Oriented to place and time. Recent and remote memory intact. Attention span, concentration and fund of knowledge appropriate. Mood and affect appropriate.  Cranial Nerves: Fundoscopic exam reveals sharp disc margins. Pupils equal, briskly reactive to light. Extraocular movements full without nystagmus. Visual fields full to confrontation. Hearing intact. Facial sensation intact. Face, tongue, palate moves normally and symmetrically.  Motor: Normal bulk and tone. Normal strength in all tested extremity muscles except for chronic mild left lower extremity weakness from prior back surgery Sensory.: intact to touch ,pinprick .position and vibratory sensation.  Coordination: Rapid alternating movements normal in all extremities. Finger-to-nose and heel-to-shin performed accurately bilaterally. Gait and Station: Use a cane due to balance issues and chronic left leg weakness from prior back surgery. Arises from chair without difficulty. Stance is antalgic with favoring her back.   Peri Jefferson to heel, toe and tandem walk Reflexes: 1+ and symmetric. Toes downgoing.     ASSESSMENT: 47 year African-American lady with 2 episodes of transient speech and word finding difficulties likely left hemispheric TIA from small vessel disease in October and November 2018. Vascular risk factors of diabetes, hypertension, hyperlipidemia and age.  Patient returns today for routine follow-up and overall continues to do well.   PLAN: -Continue aspirin 81 mg daily  and lipitor  for secondary stroke prevention -F/u with PCP regarding your HLD, and HTN management -f/u with endocrinologist regarding diabetic management -continue to monitor BP at home -Advised to continue to follow with PCP regarding vasovagal  responses and possibly consider follow-up with GI specialist -Advised that if balance/dizziness spells become more frequent, can consider trial of physical therapy in the future -Maintain strict control of hypertension with blood pressure goal below 130/90, diabetes with hemoglobin A1c goal below 6.5% and cholesterol with LDL cholesterol (bad cholesterol)  goal below 70 mg/dL. I also advised the patient to eat a healthy diet with plenty of whole grains, cereals, fruits and vegetables, exercise regularly and maintain ideal body weight.  Follow up as needed as patient stable from stroke standpoint or call earlier if needed  Greater than 50% of time during this 25 minute visit was spent on counseling,explanation of diagnosis, planning of further management, discussion with patient and family and coordination of care  Venancio Poisson, [facility]  [facility] [facility] [address] [place], [place] [address]  Phone [phone] Fax [phone] Note: This document was prepared with digital dictation and possible smart phrase technology. Any transcriptional errors that result from this process are unintentional.

## 2018-06-26 ENCOUNTER — Ambulatory Visit (INDEPENDENT_AMBULATORY_CARE_PROVIDER_SITE_OTHER): Payer: Medicare Other | Admitting: Adult Health

## 2018-06-26 ENCOUNTER — Encounter: Payer: Self-pay | Admitting: Adult Health

## 2018-06-26 VITALS — BP 106/72 | HR 77 | Wt 158.0 lb

## 2018-06-26 DIAGNOSIS — E785 Hyperlipidemia, unspecified: Secondary | ICD-10-CM

## 2018-06-26 DIAGNOSIS — I10 Essential (primary) hypertension: Secondary | ICD-10-CM

## 2018-06-26 DIAGNOSIS — G459 Transient cerebral ischemic attack, unspecified: Secondary | ICD-10-CM

## 2018-06-26 DIAGNOSIS — E1141 Type 2 diabetes mellitus with diabetic mononeuropathy: Secondary | ICD-10-CM

## 2018-06-26 DIAGNOSIS — Z794 Long term (current) use of insulin: Secondary | ICD-10-CM

## 2018-06-26 NOTE — Patient Instructions (Signed)
Continue aspirin 81 mg daily  and lipitor  for secondary stroke prevention  Continue to follow up with PCP regarding cholesterol and blood pressure management   Continue to follow up with endocrinologist for diabetes management  Continue to monitor blood pressure at home  Continue to stay active and eat healthy  Maintain strict control of hypertension with blood pressure goal below 130/90, diabetes with hemoglobin A1c goal below 6.5% and cholesterol with LDL cholesterol (bad cholesterol) goal below 70 mg/dL. I also advised the patient to eat a healthy diet with plenty of whole grains, cereals, fruits and vegetables, exercise regularly and maintain ideal body weight.  Followup in the future with me as needed or call earlier if needed       Thank you for coming to see [name] at [facility]. I hope we have been able to provide you high quality care today.  You may receive a patient satisfaction survey over the next few weeks. We would appreciate your feedback and comments so that we may continue to improve ourselves and the health of our patients.

## 2018-06-30 ENCOUNTER — Other Ambulatory Visit: Payer: Self-pay

## 2018-07-13 NOTE — Progress Notes (Signed)
I agree with the above plan 

## 2018-08-02 DIAGNOSIS — E11319 Type 2 diabetes mellitus with unspecified diabetic retinopathy without macular edema: Secondary | ICD-10-CM | POA: Diagnosis not present

## 2018-08-02 DIAGNOSIS — I129 Hypertensive chronic kidney disease with stage 1 through stage 4 chronic kidney disease, or unspecified chronic kidney disease: Secondary | ICD-10-CM | POA: Diagnosis not present

## 2018-08-02 DIAGNOSIS — E1122 Type 2 diabetes mellitus with diabetic chronic kidney disease: Secondary | ICD-10-CM | POA: Diagnosis not present

## 2018-08-02 DIAGNOSIS — G459 Transient cerebral ischemic attack, unspecified: Secondary | ICD-10-CM | POA: Diagnosis not present

## 2018-08-02 DIAGNOSIS — N2581 Secondary hyperparathyroidism of renal origin: Secondary | ICD-10-CM | POA: Diagnosis not present

## 2018-08-02 DIAGNOSIS — D638 Anemia in other chronic diseases classified elsewhere: Secondary | ICD-10-CM | POA: Diagnosis not present

## 2018-08-02 DIAGNOSIS — E1165 Type 2 diabetes mellitus with hyperglycemia: Secondary | ICD-10-CM | POA: Diagnosis not present

## 2018-08-02 DIAGNOSIS — N183 Chronic kidney disease, stage 3 (moderate): Secondary | ICD-10-CM | POA: Diagnosis not present

## 2018-08-02 DIAGNOSIS — E1129 Type 2 diabetes mellitus with other diabetic kidney complication: Secondary | ICD-10-CM | POA: Diagnosis not present

## 2018-08-02 DIAGNOSIS — E039 Hypothyroidism, unspecified: Secondary | ICD-10-CM | POA: Diagnosis not present

## 2018-08-23 DIAGNOSIS — E1165 Type 2 diabetes mellitus with hyperglycemia: Secondary | ICD-10-CM | POA: Diagnosis not present

## 2018-08-23 DIAGNOSIS — Z794 Long term (current) use of insulin: Secondary | ICD-10-CM | POA: Diagnosis not present

## 2018-08-23 DIAGNOSIS — E1121 Type 2 diabetes mellitus with diabetic nephropathy: Secondary | ICD-10-CM | POA: Diagnosis not present

## 2018-10-03 DIAGNOSIS — N289 Disorder of kidney and ureter, unspecified: Secondary | ICD-10-CM | POA: Diagnosis not present

## 2018-10-03 DIAGNOSIS — I10 Essential (primary) hypertension: Secondary | ICD-10-CM | POA: Diagnosis not present

## 2018-10-03 DIAGNOSIS — E782 Mixed hyperlipidemia: Secondary | ICD-10-CM | POA: Diagnosis not present

## 2018-10-03 DIAGNOSIS — R944 Abnormal results of kidney function studies: Secondary | ICD-10-CM | POA: Diagnosis not present

## 2018-10-06 DIAGNOSIS — R944 Abnormal results of kidney function studies: Secondary | ICD-10-CM | POA: Diagnosis not present

## 2018-10-06 DIAGNOSIS — E1165 Type 2 diabetes mellitus with hyperglycemia: Secondary | ICD-10-CM | POA: Diagnosis not present

## 2018-10-06 DIAGNOSIS — Z23 Encounter for immunization: Secondary | ICD-10-CM | POA: Diagnosis not present

## 2018-10-06 DIAGNOSIS — I10 Essential (primary) hypertension: Secondary | ICD-10-CM | POA: Diagnosis not present

## 2018-10-20 DIAGNOSIS — R002 Palpitations: Secondary | ICD-10-CM | POA: Diagnosis not present

## 2018-10-20 DIAGNOSIS — R079 Chest pain, unspecified: Secondary | ICD-10-CM | POA: Diagnosis not present

## 2018-10-20 DIAGNOSIS — R0602 Shortness of breath: Secondary | ICD-10-CM | POA: Diagnosis not present

## 2018-10-20 DIAGNOSIS — R0609 Other forms of dyspnea: Secondary | ICD-10-CM | POA: Diagnosis not present

## 2018-10-24 DIAGNOSIS — N390 Urinary tract infection, site not specified: Secondary | ICD-10-CM | POA: Diagnosis not present

## 2018-10-24 DIAGNOSIS — I10 Essential (primary) hypertension: Secondary | ICD-10-CM | POA: Diagnosis not present

## 2018-10-24 DIAGNOSIS — R0609 Other forms of dyspnea: Secondary | ICD-10-CM | POA: Diagnosis not present

## 2018-10-30 DIAGNOSIS — Z6826 Body mass index (BMI) 26.0-26.9, adult: Secondary | ICD-10-CM | POA: Diagnosis not present

## 2018-10-30 DIAGNOSIS — N390 Urinary tract infection, site not specified: Secondary | ICD-10-CM | POA: Diagnosis not present

## 2018-10-30 DIAGNOSIS — Z0000 Encounter for general adult medical examination without abnormal findings: Secondary | ICD-10-CM | POA: Diagnosis not present

## 2018-11-07 DIAGNOSIS — R0602 Shortness of breath: Secondary | ICD-10-CM | POA: Diagnosis not present

## 2018-11-07 DIAGNOSIS — R079 Chest pain, unspecified: Secondary | ICD-10-CM | POA: Diagnosis not present

## 2018-11-13 DIAGNOSIS — R0789 Other chest pain: Secondary | ICD-10-CM | POA: Diagnosis not present

## 2018-11-13 DIAGNOSIS — R0602 Shortness of breath: Secondary | ICD-10-CM | POA: Diagnosis not present

## 2018-11-18 DIAGNOSIS — R002 Palpitations: Secondary | ICD-10-CM | POA: Diagnosis not present

## 2018-12-03 ENCOUNTER — Ambulatory Visit (HOSPITAL_COMMUNITY)
Admission: EM | Admit: 2018-12-03 | Discharge: 2018-12-03 | Disposition: A | Discharge status: Home / Self Care | Payer: Medicare Other | Attending: Family Medicine | Admitting: Family Medicine

## 2018-12-03 ENCOUNTER — Encounter (HOSPITAL_COMMUNITY): Payer: Self-pay | Admitting: Emergency Medicine

## 2018-12-03 ENCOUNTER — Other Ambulatory Visit: Payer: Self-pay

## 2018-12-03 DIAGNOSIS — J0110 Acute frontal sinusitis, unspecified: Secondary | ICD-10-CM | POA: Diagnosis not present

## 2018-12-03 DIAGNOSIS — N183 Chronic kidney disease, stage 3 unspecified: Secondary | ICD-10-CM

## 2018-12-03 MED ORDER — AMOXICILLIN 500 MG PO CAPS: 500.0000 mg | ORAL_CAPSULE | Freq: Two times a day (BID) | ORAL | 0 refills | Status: DC

## 2018-12-03 MED ORDER — ACETAMINOPHEN 325 MG PO TABS
ORAL_TABLET | ORAL | Status: AC
  Filled 2018-12-03: qty 2

## 2018-12-03 MED ORDER — ACETAMINOPHEN 325 MG PO TABS
650.0000 mg | ORAL_TABLET | Freq: Once | ORAL | Status: AC
  Administered 2018-12-03: 650 mg via ORAL

## 2018-12-03 MED ORDER — BENZONATATE 100 MG PO CAPS: 100.0000 mg | ORAL_CAPSULE | Freq: Three times a day (TID) | ORAL | 0 refills | Status: DC | PRN

## 2018-12-03 NOTE — ED Provider Notes (Signed)
MRN: [id number] DOB: 1936-07-11  Subjective:   Diana Harper is a 83 y.o. female presenting for 3 day history of worsening persistent/constant throat pain that elicits uppermost chest pain over her throat, has nasal congestion/pressure, moderate-severe malaise. Has partial dentures but feels pain there as well. Has productive cough that is clear in color, mild intermittent body aches, a moderate frontal headache. Has tried APAP, flu medication, sore throat medications all otc with minimal relief. Denies headaches, confusion, dizziness, ear pain, n/v, abdominal pain, dysuria, urinary frequency. Denies smoking cigarettes. Has CKD, sees a nephrologist. Last GFR was 36 on 02/13/2018 and has been consistent for the past 5 years.   No current facility-administered medications for this encounter.   Current Outpatient Medications:  .  acetaminophen (TYLENOL) 500 MG tablet, Take 1,000 mg by mouth every 6 (six) hours as needed for mild pain, moderate pain, fever or headache., Disp: , Rfl:  .  amLODipine (NORVASC) 5 MG tablet, Take 5 mg daily with breakfast by mouth. , Disp: , Rfl:  .  aspirin 81 MG EC tablet, Take by mouth., Disp: , Rfl:  .  atorvastatin (LIPITOR) 40 MG tablet, Take 1 tablet (40 mg total) daily by mouth. (Patient taking differently: Take 20 mg by mouth daily. ), Disp: 30 tablet, Rfl: 12 .  Cholecalciferol 1000 units capsule, Take 5,000 Units by mouth. , Disp: , Rfl:  .  gabapentin (NEURONTIN) 100 MG capsule, Take 100-200 mg at bedtime as needed by mouth (nerve pain). , Disp: , Rfl:  .  hydrALAZINE (APRESOLINE) 25 MG tablet, Take 25 mg daily by mouth. , Disp: , Rfl:  .  Insulin Degludec (TRESIBA FLEXTOUCH) 200 UNIT/ML SOPN, Inject 80 Units daily with supper into the skin., Disp: , Rfl:  .  liraglutide (VICTOZA) 18 MG/3ML SOPN, Inject 1.2 mg daily into the skin., Disp: , Rfl:  .  loratadine (CLARITIN) 10 MG tablet, Take 10 mg daily as needed by mouth (seasonal allergies). , Disp: , Rfl:  .   losartan-hydrochlorothiazide (HYZAAR) 100-25 MG tablet, Take 1 tablet by mouth daily with breakfast. , Disp: , Rfl:  .  sertraline (ZOLOFT) 50 MG tablet, Take 50 mg daily by mouth. , Disp: , Rfl:  .  HYDROcodone-acetaminophen (NORCO/VICODIN) 5-325 MG tablet, Take 1-2 tablets by mouth every 6 (six) hours as needed for moderate pain., Disp: 30 tablet, Rfl: 0   Allergies  Allergen Reactions  . Latex Anaphylaxis and Shortness Of Breath    : severe respiratory distress  . Onion Diarrhea  . Caffeine Other (See Comments)    Stomach pain from pepsi  . Lisinopril Cough  . Metformin And Related Diarrhea  . Simvastatin Rash  . Tetracycline Rash  . Zithromax [Azithromycin] Rash    Past Medical History:  Diagnosis Date  . Anemia   . Anxiety   . Arthritis   . Biliary colic   . Cholelithiasis   . CKD (chronic kidney disease), stage III [facility])    nephrologist--  dr Meredeth Ide  . Diverticulosis of colon   . History of asthma    1980's  no longer problem since 1980's  . History of diverticulitis of colon    recurrent--  2014;  2013;  2012  . Hyperlipidemia   . Hypertension   . Insulin dependent type 2 diabetes mellitus ([place])    followed by dr Jenny Reichmann lambeth ([facility])   . Murmur   . Peripheral neuropathy   . Stroke ([place])   . Vitamin D deficiency   . Wears  hearing aid    bilateral  . Wears partial dentures    lower partial and upper full     Past Surgical History:  Procedure Laterality Date  . ABDOMINAL HYSTERECTOMY  1970's  . CARDIOVASCULAR STRESS TEST  07/30/2009   normal exercise lexiscan nuclear study w/ no ischemia/  normal LV funciton and wall motion, ef 77%  . CATARACT EXTRACTION W/ INTRAOCULAR LENS  IMPLANT, BILATERAL  2014  . LAPAROSCOPIC CHOLECYSTECTOMY SINGLE SITE WITH INTRAOPERATIVE CHOLANGIOGRAM N/A 08/25/2016   Procedure: LAPAROSCOPIC CHOLECYSTECTOMY WITH INTRAOPERATIVE CHOLANGIOGRAM;  Surgeon: [name], MD;  Location: [place];  Service:  General;  Laterality: N/A;  . LUMBAR SPINE SURGERY  2014  . REMOVAL AXILLA CYST Right 1990's    Objective:   Vitals: BP (!) 93/52 (BP Location: Right Arm)   Pulse 89   Temp 99.6 F (37.6 C) (Oral)   SpO2 98%   Physical Exam Constitutional:      General: She is not in acute distress.    Appearance: Normal appearance. She is well-developed and normal weight. She is ill-appearing. She is not toxic-appearing or diaphoretic.  HENT:     Head: Normocephalic and atraumatic.     Right Ear: Tympanic membrane, ear canal and external ear normal. There is no impacted cerumen.     Left Ear: Tympanic membrane, ear canal and external ear normal. There is no impacted cerumen.     Nose: Congestion and rhinorrhea present.     Comments: Mild frontal sinus tenderness.    Mouth/Throat:     Mouth: Mucous membranes are moist.     Pharynx: Oropharynx is clear. No oropharyngeal exudate or posterior oropharyngeal erythema.     Comments: Partial upper dentures in place. Eyes:     General: No scleral icterus.       Right eye: No discharge.        Left eye: No discharge.     Extraocular Movements: Extraocular movements intact.     Conjunctiva/sclera: Conjunctivae normal.     Pupils: Pupils are equal, round, and reactive to light.  Cardiovascular:     Rate and Rhythm: Normal rate and regular rhythm.     Pulses: Normal pulses.     Heart sounds: Murmur (Soft systolic best heard at left upper sternal border) present. No friction rub. No gallop.   Pulmonary:     Effort: Pulmonary effort is normal. No respiratory distress.     Breath sounds: Normal breath sounds. No stridor. No wheezing, rhonchi or rales.  Skin:    General: Skin is warm and dry.     Findings: No rash.  Neurological:     Mental Status: She is alert and oriented to person, place, and time.  Psychiatric:        Mood and Affect: Mood normal.        Behavior: Behavior normal.        Thought Content: Thought content normal.    Assessment  and Plan :   Acute non-recurrent frontal sinusitis  CKD (chronic kidney disease) stage 3, GFR 30-59 ml/min (HCC)  Will cover for sinusitis with amoxicillin at 500 mg twice daily.  Her last GFR was March 2020 and was consistent with CKD stage III.  Recommended supportive care, restarting her Claritin.  Offered Tessalon capsules.  Patient and I both agreed to hold off on a chest x-ray today given that her lung sounds are clear and most of her symptoms are upper respiratory in nature.  Counseled patient on  potential for adverse effects with medications prescribed today, patient verbalized understanding. Strict ER precautions reviewed.  Follow-up with PCP otherwise.   Jaynee Eagles, PA-C 12/03/18 1145

## 2018-12-03 NOTE — ED Triage Notes (Signed)
Pt states she started with a sore throat three days ago and progressed to nasal congestion, cough, and SOB.  She thinks she may have a fever as well.

## 2018-12-03 NOTE — Discharge Instructions (Addendum)
You may take 500mg  Tylenol every 6 hours for pain and inflammation. For sore throat try using a honey-based tea. Use 3 teaspoons of honey with juice squeezed from half lemon. Place shaved pieces of ginger into 1/2-1 cup of water and warm over stove top. Then mix the ingredients and repeat every 4 hours as needed. Restart your Claritin. Make sure you keep hydrating well.

## 2019-01-08 DIAGNOSIS — N289 Disorder of kidney and ureter, unspecified: Secondary | ICD-10-CM | POA: Diagnosis not present

## 2019-01-08 DIAGNOSIS — I10 Essential (primary) hypertension: Secondary | ICD-10-CM | POA: Diagnosis not present

## 2019-01-10 DIAGNOSIS — I10 Essential (primary) hypertension: Secondary | ICD-10-CM | POA: Diagnosis not present

## 2019-01-10 DIAGNOSIS — E1140 Type 2 diabetes mellitus with diabetic neuropathy, unspecified: Secondary | ICD-10-CM | POA: Diagnosis not present

## 2019-01-10 DIAGNOSIS — N289 Disorder of kidney and ureter, unspecified: Secondary | ICD-10-CM | POA: Diagnosis not present

## 2019-01-10 DIAGNOSIS — Z6827 Body mass index (BMI) 27.0-27.9, adult: Secondary | ICD-10-CM | POA: Diagnosis not present

## 2019-01-10 DIAGNOSIS — E1165 Type 2 diabetes mellitus with hyperglycemia: Secondary | ICD-10-CM | POA: Diagnosis not present

## 2019-03-13 ENCOUNTER — Other Ambulatory Visit: Payer: Self-pay

## 2019-03-13 ENCOUNTER — Other Ambulatory Visit: Payer: Self-pay | Admitting: Cardiology

## 2019-03-13 DIAGNOSIS — F330 Major depressive disorder, recurrent, mild: Secondary | ICD-10-CM | POA: Diagnosis not present

## 2019-03-13 MED ORDER — METOPROLOL TARTRATE 25 MG PO TABS: 25.0000 mg | ORAL_TABLET | Freq: Two times a day (BID) | ORAL | 1 refills | Status: DC

## 2019-05-11 DIAGNOSIS — E1165 Type 2 diabetes mellitus with hyperglycemia: Secondary | ICD-10-CM | POA: Diagnosis not present

## 2019-05-11 DIAGNOSIS — E1121 Type 2 diabetes mellitus with diabetic nephropathy: Secondary | ICD-10-CM | POA: Diagnosis not present

## 2019-05-11 DIAGNOSIS — Z794 Long term (current) use of insulin: Secondary | ICD-10-CM | POA: Diagnosis not present

## 2019-05-16 DIAGNOSIS — E1165 Type 2 diabetes mellitus with hyperglycemia: Secondary | ICD-10-CM | POA: Diagnosis not present

## 2019-05-16 DIAGNOSIS — Z794 Long term (current) use of insulin: Secondary | ICD-10-CM | POA: Diagnosis not present

## 2019-05-16 DIAGNOSIS — E1121 Type 2 diabetes mellitus with diabetic nephropathy: Secondary | ICD-10-CM | POA: Diagnosis not present

## 2019-06-13 DIAGNOSIS — E1121 Type 2 diabetes mellitus with diabetic nephropathy: Secondary | ICD-10-CM | POA: Diagnosis not present

## 2019-06-13 DIAGNOSIS — E1165 Type 2 diabetes mellitus with hyperglycemia: Secondary | ICD-10-CM | POA: Diagnosis not present

## 2019-06-13 DIAGNOSIS — Z794 Long term (current) use of insulin: Secondary | ICD-10-CM | POA: Diagnosis not present

## 2019-06-13 DIAGNOSIS — E1140 Type 2 diabetes mellitus with diabetic neuropathy, unspecified: Secondary | ICD-10-CM | POA: Diagnosis not present

## 2019-07-05 DIAGNOSIS — Z79899 Other long term (current) drug therapy: Secondary | ICD-10-CM | POA: Diagnosis not present

## 2019-07-05 DIAGNOSIS — E559 Vitamin D deficiency, unspecified: Secondary | ICD-10-CM | POA: Diagnosis not present

## 2019-07-05 DIAGNOSIS — I10 Essential (primary) hypertension: Secondary | ICD-10-CM | POA: Diagnosis not present

## 2019-07-05 DIAGNOSIS — E782 Mixed hyperlipidemia: Secondary | ICD-10-CM | POA: Diagnosis not present

## 2019-07-10 DIAGNOSIS — M25512 Pain in left shoulder: Secondary | ICD-10-CM | POA: Diagnosis not present

## 2019-07-10 DIAGNOSIS — E782 Mixed hyperlipidemia: Secondary | ICD-10-CM | POA: Diagnosis not present

## 2019-07-10 DIAGNOSIS — E559 Vitamin D deficiency, unspecified: Secondary | ICD-10-CM | POA: Diagnosis not present

## 2019-07-10 DIAGNOSIS — I10 Essential (primary) hypertension: Secondary | ICD-10-CM | POA: Diagnosis not present

## 2019-07-31 DIAGNOSIS — I10 Essential (primary) hypertension: Secondary | ICD-10-CM | POA: Diagnosis not present

## 2019-07-31 DIAGNOSIS — M19019 Primary osteoarthritis, unspecified shoulder: Secondary | ICD-10-CM | POA: Diagnosis not present

## 2019-07-31 DIAGNOSIS — M25512 Pain in left shoulder: Secondary | ICD-10-CM | POA: Diagnosis not present

## 2019-07-31 DIAGNOSIS — M542 Cervicalgia: Secondary | ICD-10-CM | POA: Diagnosis not present

## 2019-08-17 DIAGNOSIS — Z794 Long term (current) use of insulin: Secondary | ICD-10-CM | POA: Diagnosis not present

## 2019-08-17 DIAGNOSIS — E1121 Type 2 diabetes mellitus with diabetic nephropathy: Secondary | ICD-10-CM | POA: Diagnosis not present

## 2019-08-17 DIAGNOSIS — E1165 Type 2 diabetes mellitus with hyperglycemia: Secondary | ICD-10-CM | POA: Diagnosis not present

## 2019-08-20 DIAGNOSIS — E1121 Type 2 diabetes mellitus with diabetic nephropathy: Secondary | ICD-10-CM | POA: Diagnosis not present

## 2019-08-20 DIAGNOSIS — E1165 Type 2 diabetes mellitus with hyperglycemia: Secondary | ICD-10-CM | POA: Diagnosis not present

## 2019-08-20 DIAGNOSIS — E1140 Type 2 diabetes mellitus with diabetic neuropathy, unspecified: Secondary | ICD-10-CM | POA: Diagnosis not present

## 2019-08-20 DIAGNOSIS — Z794 Long term (current) use of insulin: Secondary | ICD-10-CM | POA: Diagnosis not present

## 2019-10-05 DIAGNOSIS — E1165 Type 2 diabetes mellitus with hyperglycemia: Secondary | ICD-10-CM | POA: Diagnosis not present

## 2019-10-05 DIAGNOSIS — I959 Hypotension, unspecified: Secondary | ICD-10-CM | POA: Diagnosis not present

## 2019-10-05 DIAGNOSIS — R1084 Generalized abdominal pain: Secondary | ICD-10-CM | POA: Diagnosis not present

## 2019-10-05 DIAGNOSIS — R55 Syncope and collapse: Secondary | ICD-10-CM | POA: Diagnosis not present

## 2019-10-05 DIAGNOSIS — R1030 Lower abdominal pain, unspecified: Secondary | ICD-10-CM | POA: Diagnosis not present

## 2019-10-09 DIAGNOSIS — R109 Unspecified abdominal pain: Secondary | ICD-10-CM | POA: Diagnosis not present

## 2019-10-09 DIAGNOSIS — I10 Essential (primary) hypertension: Secondary | ICD-10-CM | POA: Diagnosis not present

## 2019-10-09 DIAGNOSIS — E1165 Type 2 diabetes mellitus with hyperglycemia: Secondary | ICD-10-CM | POA: Diagnosis not present

## 2019-10-09 DIAGNOSIS — E559 Vitamin D deficiency, unspecified: Secondary | ICD-10-CM | POA: Diagnosis not present

## 2019-10-30 DIAGNOSIS — E559 Vitamin D deficiency, unspecified: Secondary | ICD-10-CM | POA: Diagnosis not present

## 2019-10-30 DIAGNOSIS — I10 Essential (primary) hypertension: Secondary | ICD-10-CM | POA: Diagnosis not present

## 2019-10-30 DIAGNOSIS — Z6827 Body mass index (BMI) 27.0-27.9, adult: Secondary | ICD-10-CM | POA: Diagnosis not present

## 2019-10-30 DIAGNOSIS — N390 Urinary tract infection, site not specified: Secondary | ICD-10-CM | POA: Diagnosis not present

## 2019-10-30 DIAGNOSIS — N289 Disorder of kidney and ureter, unspecified: Secondary | ICD-10-CM | POA: Diagnosis not present

## 2019-10-31 DIAGNOSIS — D649 Anemia, unspecified: Secondary | ICD-10-CM | POA: Diagnosis not present

## 2019-10-31 DIAGNOSIS — E559 Vitamin D deficiency, unspecified: Secondary | ICD-10-CM | POA: Diagnosis not present

## 2019-11-02 DIAGNOSIS — Z0000 Encounter for general adult medical examination without abnormal findings: Secondary | ICD-10-CM | POA: Diagnosis not present

## 2019-11-06 DIAGNOSIS — Z23 Encounter for immunization: Secondary | ICD-10-CM | POA: Diagnosis not present

## 2019-11-06 DIAGNOSIS — I10 Essential (primary) hypertension: Secondary | ICD-10-CM | POA: Diagnosis not present

## 2019-11-06 DIAGNOSIS — N289 Disorder of kidney and ureter, unspecified: Secondary | ICD-10-CM | POA: Diagnosis not present

## 2019-11-30 HISTORY — PX: CATARACT EXTRACTION, BILATERAL: SHX1313

## 2019-12-06 DIAGNOSIS — E1165 Type 2 diabetes mellitus with hyperglycemia: Secondary | ICD-10-CM | POA: Diagnosis not present

## 2019-12-06 DIAGNOSIS — R0902 Hypoxemia: Secondary | ICD-10-CM | POA: Diagnosis not present

## 2019-12-06 DIAGNOSIS — S0990XA Unspecified injury of head, initial encounter: Secondary | ICD-10-CM | POA: Diagnosis not present

## 2019-12-06 DIAGNOSIS — R0602 Shortness of breath: Secondary | ICD-10-CM | POA: Diagnosis not present

## 2019-12-06 DIAGNOSIS — R001 Bradycardia, unspecified: Secondary | ICD-10-CM | POA: Diagnosis not present

## 2019-12-06 DIAGNOSIS — R55 Syncope and collapse: Secondary | ICD-10-CM | POA: Diagnosis not present

## 2019-12-06 DIAGNOSIS — R4020 Unspecified coma: Secondary | ICD-10-CM | POA: Diagnosis not present

## 2019-12-06 DIAGNOSIS — I10 Essential (primary) hypertension: Secondary | ICD-10-CM | POA: Diagnosis not present

## 2019-12-07 ENCOUNTER — Other Ambulatory Visit: Payer: Self-pay | Admitting: Family Medicine

## 2019-12-07 DIAGNOSIS — W19XXXA Unspecified fall, initial encounter: Secondary | ICD-10-CM

## 2019-12-07 DIAGNOSIS — S0990XA Unspecified injury of head, initial encounter: Secondary | ICD-10-CM

## 2019-12-10 ENCOUNTER — Other Ambulatory Visit: Payer: Medicare Other

## 2019-12-12 ENCOUNTER — Inpatient Hospital Stay: Admission: RE | Admit: 2019-12-12 | Payer: Medicare Other | Source: Ambulatory Visit

## 2019-12-12 ENCOUNTER — Emergency Department (HOSPITAL_COMMUNITY): Payer: Medicare Other

## 2019-12-12 ENCOUNTER — Encounter (HOSPITAL_COMMUNITY): Payer: Self-pay

## 2019-12-12 ENCOUNTER — Inpatient Hospital Stay (HOSPITAL_COMMUNITY)
Admission: EM | Admit: 2019-12-12 | Discharge: 2019-12-14 | DRG: 684 | Disposition: A | Discharge status: Home / Self Care | Payer: Medicare Other | Attending: Internal Medicine | Admitting: Internal Medicine

## 2019-12-12 ENCOUNTER — Other Ambulatory Visit: Payer: Self-pay

## 2019-12-12 DIAGNOSIS — Z7982 Long term (current) use of aspirin: Secondary | ICD-10-CM

## 2019-12-12 DIAGNOSIS — E1122 Type 2 diabetes mellitus with diabetic chronic kidney disease: Secondary | ICD-10-CM | POA: Diagnosis present

## 2019-12-12 DIAGNOSIS — Z91040 Latex allergy status: Secondary | ICD-10-CM

## 2019-12-12 DIAGNOSIS — Z20822 Contact with and (suspected) exposure to covid-19: Secondary | ICD-10-CM | POA: Diagnosis not present

## 2019-12-12 DIAGNOSIS — R197 Diarrhea, unspecified: Secondary | ICD-10-CM | POA: Diagnosis not present

## 2019-12-12 DIAGNOSIS — I959 Hypotension, unspecified: Secondary | ICD-10-CM | POA: Diagnosis present

## 2019-12-12 DIAGNOSIS — Z8673 Personal history of transient ischemic attack (TIA), and cerebral infarction without residual deficits: Secondary | ICD-10-CM

## 2019-12-12 DIAGNOSIS — M1990 Unspecified osteoarthritis, unspecified site: Secondary | ICD-10-CM | POA: Diagnosis present

## 2019-12-12 DIAGNOSIS — R52 Pain, unspecified: Secondary | ICD-10-CM | POA: Diagnosis not present

## 2019-12-12 DIAGNOSIS — E860 Dehydration: Secondary | ICD-10-CM | POA: Diagnosis present

## 2019-12-12 DIAGNOSIS — N1831 Chronic kidney disease, stage 3a: Secondary | ICD-10-CM | POA: Diagnosis not present

## 2019-12-12 DIAGNOSIS — E785 Hyperlipidemia, unspecified: Secondary | ICD-10-CM | POA: Diagnosis not present

## 2019-12-12 DIAGNOSIS — R0789 Other chest pain: Secondary | ICD-10-CM | POA: Diagnosis not present

## 2019-12-12 DIAGNOSIS — Z91018 Allergy to other foods: Secondary | ICD-10-CM

## 2019-12-12 DIAGNOSIS — N179 Acute kidney failure, unspecified: Secondary | ICD-10-CM | POA: Diagnosis not present

## 2019-12-12 DIAGNOSIS — A084 Viral intestinal infection, unspecified: Secondary | ICD-10-CM | POA: Diagnosis present

## 2019-12-12 DIAGNOSIS — R1084 Generalized abdominal pain: Secondary | ICD-10-CM | POA: Diagnosis not present

## 2019-12-12 DIAGNOSIS — I129 Hypertensive chronic kidney disease with stage 1 through stage 4 chronic kidney disease, or unspecified chronic kidney disease: Secondary | ICD-10-CM | POA: Diagnosis present

## 2019-12-12 DIAGNOSIS — Z794 Long term (current) use of insulin: Secondary | ICD-10-CM

## 2019-12-12 DIAGNOSIS — Z888 Allergy status to other drugs, medicaments and biological substances status: Secondary | ICD-10-CM

## 2019-12-12 DIAGNOSIS — I639 Cerebral infarction, unspecified: Secondary | ICD-10-CM | POA: Diagnosis present

## 2019-12-12 DIAGNOSIS — E1142 Type 2 diabetes mellitus with diabetic polyneuropathy: Secondary | ICD-10-CM | POA: Diagnosis present

## 2019-12-12 DIAGNOSIS — A09 Infectious gastroenteritis and colitis, unspecified: Secondary | ICD-10-CM | POA: Diagnosis not present

## 2019-12-12 DIAGNOSIS — Z881 Allergy status to other antibiotic agents status: Secondary | ICD-10-CM

## 2019-12-12 DIAGNOSIS — Z823 Family history of stroke: Secondary | ICD-10-CM

## 2019-12-12 DIAGNOSIS — Z79899 Other long term (current) drug therapy: Secondary | ICD-10-CM

## 2019-12-12 DIAGNOSIS — R109 Unspecified abdominal pain: Secondary | ICD-10-CM | POA: Diagnosis not present

## 2019-12-12 DIAGNOSIS — I10 Essential (primary) hypertension: Secondary | ICD-10-CM | POA: Diagnosis not present

## 2019-12-12 LAB — COMPREHENSIVE METABOLIC PANEL
ALT: 39 U/L (ref 0–44)
AST: 38 U/L (ref 15–41)
Albumin: 4.1 g/dL (ref 3.5–5.0)
Alkaline Phosphatase: 78 U/L (ref 38–126)
Anion gap: 9 (ref 5–15)
BUN: 52 mg/dL — ABNORMAL HIGH (ref 8–23)
CO2: 22 mmol/L (ref 22–32)
Calcium: 9.3 mg/dL (ref 8.9–10.3)
Chloride: 106 mmol/L (ref 98–111)
Creatinine, Ser: 2.44 mg/dL — ABNORMAL HIGH (ref 0.44–1.00)
GFR calc Af Amer: 21 mL/min — ABNORMAL LOW (ref >60)
GFR calc non Af Amer: 18 mL/min — ABNORMAL LOW (ref >60)
Glucose, Bld: 87 mg/dL (ref 70–99)
Potassium: 3.6 mmol/L (ref 3.5–5.1)
Sodium: 137 mmol/L (ref 135–145)
Total Bilirubin: 0.9 mg/dL (ref 0.3–1.2)
Total Protein: 7.2 g/dL (ref 6.5–8.1)

## 2019-12-12 LAB — CBC
HCT: 36.6 % (ref 36.0–46.0)
Hemoglobin: 11.9 g/dL — ABNORMAL LOW (ref 12.0–15.0)
MCH: 30.3 pg (ref 26.0–34.0)
MCHC: 32.5 g/dL (ref 30.0–36.0)
MCV: 93.1 fL (ref 80.0–100.0)
Platelets: 232 10*3/uL (ref 150–400)
RBC: 3.93 MIL/uL (ref 3.87–5.11)
RDW: 13.2 % (ref 11.5–15.5)
WBC: 7.7 10*3/uL (ref 4.0–10.5)
nRBC: 0 % (ref 0.0–0.2)

## 2019-12-12 LAB — RESPIRATORY PANEL BY RT PCR (FLU A&B, COVID)
Influenza A by PCR: NEGATIVE
Influenza B by PCR: NEGATIVE
SARS Coronavirus 2 by RT PCR: NEGATIVE

## 2019-12-12 LAB — HEMOGLOBIN A1C
Hgb A1c MFr Bld: 13.2 % — ABNORMAL HIGH (ref 4.8–5.6)
Mean Plasma Glucose: 332.14 mg/dL

## 2019-12-12 LAB — GLUCOSE, CAPILLARY
Glucose-Capillary: 170 mg/dL — ABNORMAL HIGH (ref 70–99)
Glucose-Capillary: 154 mg/dL — ABNORMAL HIGH (ref 70–99)

## 2019-12-12 LAB — TROPONIN I (HIGH SENSITIVITY): Troponin I (High Sensitivity): 5 ng/L (ref <18)

## 2019-12-12 LAB — LIPASE, BLOOD: Lipase: 37 U/L (ref 11–51)

## 2019-12-12 LAB — LACTIC ACID, PLASMA: Lactic Acid, Venous: 0.5 mmol/L (ref 0.5–1.9)

## 2019-12-12 MED ORDER — ATORVASTATIN CALCIUM 10 MG PO TABS
20.0000 mg | ORAL_TABLET | Freq: Every day | ORAL | Status: DC
  Administered 2019-12-12 – 2019-12-14 (×3): 20 mg via ORAL
  Filled 2019-12-12 (×3): qty 2

## 2019-12-12 MED ORDER — SODIUM CHLORIDE (PF) 0.9 % IJ SOLN
INTRAMUSCULAR | Status: AC
  Filled 2019-12-12: qty 50

## 2019-12-12 MED ORDER — ONDANSETRON HCL 4 MG/2ML IJ SOLN: 4.0000 mg | Freq: Four times a day (QID) | INTRAMUSCULAR | Status: DC | PRN

## 2019-12-12 MED ORDER — HYDROCODONE-ACETAMINOPHEN 5-325 MG PO TABS
1.0000 | ORAL_TABLET | ORAL | Status: DC | PRN
  Administered 2019-12-13: 1 via ORAL
  Filled 2019-12-12: qty 1

## 2019-12-12 MED ORDER — GABAPENTIN 100 MG PO CAPS: 100.0000 mg | ORAL_CAPSULE | Freq: Every evening | ORAL | Status: DC | PRN

## 2019-12-12 MED ORDER — INSULIN DETEMIR 100 UNIT/ML ~~LOC~~ SOLN
40.0000 [IU] | Freq: Every day | SUBCUTANEOUS | Status: DC
  Administered 2019-12-12 – 2019-12-13 (×2): 40 [IU] via SUBCUTANEOUS
  Filled 2019-12-12 (×3): qty 0.4

## 2019-12-12 MED ORDER — VITAMIN D3 25 MCG (1000 UNIT) PO TABS
5000.0000 [IU] | ORAL_TABLET | Freq: Every day | ORAL | Status: DC
  Administered 2019-12-12 – 2019-12-13 (×2): 5000 [IU] via ORAL
  Filled 2019-12-12 (×4): qty 5

## 2019-12-12 MED ORDER — HEPARIN SODIUM (PORCINE) 5000 UNIT/ML IJ SOLN
5000.0000 [IU] | Freq: Three times a day (TID) | INTRAMUSCULAR | Status: DC
  Administered 2019-12-12 – 2019-12-14 (×6): 5000 [IU] via SUBCUTANEOUS
  Filled 2019-12-12 (×6): qty 1

## 2019-12-12 MED ORDER — SODIUM CHLORIDE 0.9 % IV BOLUS
1000.0000 mL | Freq: Once | INTRAVENOUS | Status: AC
  Administered 2019-12-12: 08:00:00 1000 mL via INTRAVENOUS

## 2019-12-12 MED ORDER — INSULIN ASPART 100 UNIT/ML ~~LOC~~ SOLN
0.0000 [IU] | Freq: Three times a day (TID) | SUBCUTANEOUS | Status: DC
  Administered 2019-12-12 – 2019-12-13 (×2): 2 [IU] via SUBCUTANEOUS

## 2019-12-12 MED ORDER — INSULIN ASPART 100 UNIT/ML ~~LOC~~ SOLN: 0.0000 [IU] | Freq: Every day | SUBCUTANEOUS | Status: DC

## 2019-12-12 MED ORDER — LORATADINE 10 MG PO TABS: 10.0000 mg | ORAL_TABLET | Freq: Every day | ORAL | Status: DC | PRN

## 2019-12-12 MED ORDER — SODIUM CHLORIDE 0.9 % IV SOLN: INTRAVENOUS | Status: DC

## 2019-12-12 MED ORDER — ONDANSETRON HCL 4 MG PO TABS: 4.0000 mg | ORAL_TABLET | Freq: Four times a day (QID) | ORAL | Status: DC | PRN

## 2019-12-12 MED ORDER — SERTRALINE HCL 50 MG PO TABS
150.0000 mg | ORAL_TABLET | Freq: Every day | ORAL | Status: DC
  Administered 2019-12-13 – 2019-12-14 (×2): 150 mg via ORAL
  Filled 2019-12-12 (×2): qty 1

## 2019-12-12 MED ORDER — ACETAMINOPHEN 650 MG RE SUPP: 650.0000 mg | Freq: Four times a day (QID) | RECTAL | Status: DC | PRN

## 2019-12-12 MED ORDER — ACETAMINOPHEN 325 MG PO TABS: 650.0000 mg | ORAL_TABLET | Freq: Four times a day (QID) | ORAL | Status: DC | PRN

## 2019-12-12 MED ORDER — FENTANYL CITRATE (PF) 100 MCG/2ML IJ SOLN
25.0000 ug | Freq: Once | INTRAMUSCULAR | Status: AC
  Administered 2019-12-12: 08:00:00 25 ug via INTRAVENOUS
  Filled 2019-12-12: qty 2

## 2019-12-12 MED ORDER — ASPIRIN EC 81 MG PO TBEC
81.0000 mg | DELAYED_RELEASE_TABLET | Freq: Every day | ORAL | Status: DC
  Administered 2019-12-12 – 2019-12-14 (×3): 81 mg via ORAL
  Filled 2019-12-12 (×3): qty 1

## 2019-12-12 NOTE — ED Notes (Signed)
Asked to undress pt into a hospital gown, but pt states she will have a BM if she moves/rolls in the bed. Pt still dressed at this time until "she has to get undressed".

## 2019-12-12 NOTE — ED Triage Notes (Addendum)
Patient arrived from home with complaints of diarrhea for the last 3 days. Family reports trying OTC medication and fluids with no relief. Patient also reporting some nausea and generalized abdominal pain.

## 2019-12-12 NOTE — ED Notes (Signed)
Pt given warm blankets and has call bell within reach.

## 2019-12-12 NOTE — ED Provider Notes (Signed)
[facility] Emergency Department Provider Note MRN:  [id number]  Arrival date & time: 12/12/19     Chief Complaint   Diarrhea   History of Present Illness   Diana Harper is a 84 y.o. year-old female with a history of diabetes, stroke, CKD presenting to the ED with chief complaint of diarrhea.  Patient is endorsing 3 days of the worst diarrhea she has ever had.  Accompanied by diffuse abdominal discomfort.  Denies fever.  No cough.  Endorsing nausea and one episode of nonbloody nonbilious emesis this morning.  No blood in the stool, no recent robotics, no recent travel.  Denies shortness of breath.  Endorsing mild chest pressure.  Symptoms are constant, moderate to severe.  Feels dehydrated.  Review of Systems  A complete 10 system review of systems was obtained and all systems are negative except as noted in the HPI and PMH.   Patient's Health History    Past Medical History:  Diagnosis Date  . Anemia   . Anxiety   . Arthritis   . Biliary colic   . Cholelithiasis   . CKD (chronic kidney disease), stage III    nephrologist--  dr Meredeth Ide  . Diverticulosis of colon   . History of asthma    1980's  no longer problem since 1980's  . History of diverticulitis of colon    recurrent--  2014;  2013;  2012  . Hyperlipidemia   . Hypertension   . Insulin dependent type 2 diabetes mellitus ([place])    followed by dr Jenny Reichmann lambeth ([facility])   . Murmur   . Peripheral neuropathy   . Stroke ([place])   . Vitamin D deficiency   . Wears hearing aid    bilateral  . Wears partial dentures    lower partial and upper full    Past Surgical History:  Procedure Laterality Date  . ABDOMINAL HYSTERECTOMY  1970's  . CARDIOVASCULAR STRESS TEST  07/30/2009   normal exercise lexiscan nuclear study w/ no ischemia/  normal LV funciton and wall motion, ef 77%  . CATARACT EXTRACTION W/ INTRAOCULAR LENS  IMPLANT, BILATERAL  2014  . LAPAROSCOPIC CHOLECYSTECTOMY SINGLE SITE WITH  INTRAOPERATIVE CHOLANGIOGRAM N/A 08/25/2016   Procedure: LAPAROSCOPIC CHOLECYSTECTOMY WITH INTRAOPERATIVE CHOLANGIOGRAM;  Surgeon: [name], MD;  Location: [place];  Service: General;  Laterality: N/A;  . LUMBAR SPINE SURGERY  2014  . REMOVAL AXILLA CYST Right 1990's    Family History  Problem Relation Age of Onset  . Stroke Mother   . Stroke Brother     Social History   Socioeconomic History  . Marital status: Widowed    Spouse name: Not on file  . Number of children: Not on file  . Years of education: Not on file  . Highest education level: Not on file  Occupational History  . Not on file  Tobacco Use  . Smoking status: Never Smoker  . Smokeless tobacco: Never Used  Substance and Sexual Activity  . Alcohol use: No  . Drug use: No  . Sexual activity: Not on file  Other Topics Concern  . Not on file  Social History Narrative  . Not on file   Social Determinants of Health   Financial Resource Strain:   . Difficulty of Paying Living Expenses: Not on file  Food Insecurity:   . Worried About Charity fundraiser in the Last Year: Not on file  . Ran Out of Food in the Last Year: Not  on file  Transportation Needs:   . Lack of Transportation (Medical): Not on file  . Lack of Transportation (Non-Medical): Not on file  Physical Activity:   . Days of Exercise per Week: Not on file  . Minutes of Exercise per Session: Not on file  Stress:   . Feeling of Stress : Not on file  Social Connections:   . Frequency of Communication with Friends and Family: Not on file  . Frequency of Social Gatherings with Friends and Family: Not on file  . Attends Religious Services: Not on file  . Active Member of Clubs or Organizations: Not on file  . Attends Archivist Meetings: Not on file  . Marital Status: Not on file  Intimate Partner Violence:   . Fear of Current or Ex-Partner: Not on file  . Emotionally Abused: Not on file  . Physically Abused:  Not on file  . Sexually Abused: Not on file     Physical Exam  Vital Signs and Nursing Notes reviewed Vitals:   12/12/19 0915 12/12/19 1000  BP:  (!) 106/53  Pulse: 77 77  Resp: 17 19  Temp:    SpO2: 98% 98%    CONSTITUTIONAL: Well-appearing, NAD NEURO:  Alert and oriented x 3, no focal deficits EYES:  eyes equal and reactive ENT/NECK:  no LAD, no JVD CARDIO: Regular rate, well-perfused, normal S1 and S2 PULM:  CTAB no wheezing or rhonchi GI/GU:  normal bowel sounds, non-distended, non-tender MSK/SPINE:  No gross deformities, no edema SKIN:  no rash, atraumatic PSYCH:  Appropriate speech and behavior  Diagnostic and Interventional Summary    EKG Interpretation  Date/Time:  Wednesday December 12 2019 07:26:15 EST Ventricular Rate:  72 PR Interval:    QRS Duration: 93 QT Interval:  392 QTC Calculation: 429 R Axis:   22 Text Interpretation: Sinus rhythm Low voltage, precordial leads Borderline T abnormalities, inferior leads Confirmed by Gerlene Fee [phone]) on 12/12/2019 7:37:58 AM      Labs Reviewed  CBC - Abnormal; Notable for the following components:      Result Value   Hemoglobin 11.9 (*)    All other components within normal limits  COMPREHENSIVE METABOLIC PANEL - Abnormal; Notable for the following components:   BUN 52 (*)    Creatinine, Ser 2.44 (*)    GFR calc non Af Amer 18 (*)    GFR calc Af Amer 21 (*)    All other components within normal limits  RESPIRATORY PANEL BY RT PCR (FLU A&B, COVID)  CULTURE, BLOOD (SINGLE)  LACTIC ACID, PLASMA  LIPASE, BLOOD  TROPONIN I (HIGH SENSITIVITY)    CT ABDOMEN PELVIS WO CONTRAST  Final Result    DG Chest Portable 1 View  Final Result      Medications  sodium chloride 0.9 % bolus 1,000 mL (0 mLs Intravenous Stopped 12/12/19 0837)  fentaNYL (SUBLIMAZE) injection 25 mcg (25 mcg Intravenous Given 12/12/19 0740)     Procedures  /  Critical Care Procedures  ED Course and Medical Decision Making  I have  reviewed the triage vital signs, the nursing notes, and pertinent available records from the EMR.  Pertinent labs & imaging results that were available during my care of the patient were reviewed by me and considered in my medical decision making (see below for details).     Favoring viral process, abdominal exam largely reassuring.  However given patient's abdominal pain, advanced age, comorbidities, history of diverticular disease, will CT to exclude diverticulitis.  CT is without acute process.  Labs reveal acute kidney injury, will request admission.  Barth Kirks. Sedonia Small, [place] [email or web address]  Final Clinical Impressions(s) / ED Diagnoses     ICD-10-CM   1. Diarrhea of presumed infectious origin  R19.7   2. Acute kidney injury ([place])  N17.9     ED Discharge Orders    None       Discharge Instructions Discussed with and Provided to Patient:   Discharge Instructions   None       Maudie Flakes, MD 12/12/19 1045

## 2019-12-12 NOTE — ED Notes (Addendum)
Patient given meal tray. Patient repositioned in bed. Call bell within reach.

## 2019-12-12 NOTE — H&P (Signed)
History and Physical        Hospital Admission Note Date: 12/12/2019  Patient name: Diana Harper Medical record number: [id number] Date of birth: 03/07/1936 Age: 84 y.o. Gender: female  PCP: [name], MD    Patient coming from: Home  I have reviewed all records in the [facility].    Chief Complaint:  Diarrhea for last 3 days, nausea with 1 episode of vomiting today, abdominal discomfort  HPI: Patient is 84 year old female with history of hypertension, hyperlipidemia, IDDM, prior history of stroke, CKD stage III, history of diverticulitis presented to ED with diarrhea for the last 3 days.  Patient reported that she had the worst diarrhea she has ever had in the last 3 days, multiple episode during the day, watery.  Denied any hematochezia or melena.  No fevers.  She did have nausea with loss of appetite, 1 episode of nonbloody nonbilious vomiting this morning.  No recent travel.  She had mild diffuse abdominal discomfort but no severe abdominal pain.  Denied any chest pain or shortness of breath. She did have penicillin use for dental work in the last 3 months.  Feels very dehydrated and weak.  States she was not even able to get up and ambulate this morning due to weakness.   ED work-up/course:  In ED temp 98.1, blood pressure 98/55, respiratory rate 15, heart rate 74, O2 sats 98% on room air CMET showed BUN 52, creatinine 2.44. CBC unremarkable, lactic acid 0.5.  Review of Systems: Positives marked in 'bold' Constitutional: Denies fever, chills, diaphoresis, +poor appetite and fatigue.  HEENT: Denies photophobia, eye pain, redness, hearing loss, ear pain, congestion, sore throat, rhinorrhea, sneezing, mouth sores, trouble swallowing, neck pain, neck stiffness and tinnitus.   Respiratory: Denies SOB, DOE, cough, chest tightness,  and wheezing.     Cardiovascular: Denies chest pain, palpitations and leg swelling.  Gastrointestinal: Please see HPI Genitourinary: Denies dysuria, urgency, frequency, hematuria, flank pain and difficulty urinating.  Musculoskeletal: Denies myalgias, back pain, joint swelling, arthralgias and gait problem.  Skin: Denies pallor, rash and wound.  Neurological: Feels dizzy, lightheaded, generalized weakness.  No syncope. Hematological: Denies adenopathy. Easy bruising, personal or family bleeding history  Psychiatric/Behavioral: Denies suicidal ideation, mood changes, confusion, nervousness, sleep disturbance and agitation  Past Medical History: Past Medical History:  Diagnosis Date  . Anemia   . Anxiety   . Arthritis   . Biliary colic   . Cholelithiasis   . CKD (chronic kidney disease), stage III    nephrologist--  dr Meredeth Ide  . Diverticulosis of colon   . History of asthma    1980's  no longer problem since 1980's  . History of diverticulitis of colon    recurrent--  2014;  2013;  2012  . Hyperlipidemia   . Hypertension   . Insulin dependent type 2 diabetes mellitus ([place])    followed by dr Jenny Reichmann lambeth ([facility])   . Murmur   . Peripheral neuropathy   . Stroke ([place])   . Vitamin D deficiency   . Wears hearing aid    bilateral  . Wears partial dentures    lower partial and upper full  Past Surgical History:  Procedure Laterality Date  . ABDOMINAL HYSTERECTOMY  1970's  . CARDIOVASCULAR STRESS TEST  07/30/2009   normal exercise lexiscan nuclear study w/ no ischemia/  normal LV funciton and wall motion, ef 77%  . CATARACT EXTRACTION W/ INTRAOCULAR LENS  IMPLANT, BILATERAL  2014  . LAPAROSCOPIC CHOLECYSTECTOMY SINGLE SITE WITH INTRAOPERATIVE CHOLANGIOGRAM N/A 08/25/2016   Procedure: LAPAROSCOPIC CHOLECYSTECTOMY WITH INTRAOPERATIVE CHOLANGIOGRAM;  Surgeon: [name], MD;  Location: [place];  Service: General;  Laterality: N/A;  . LUMBAR SPINE SURGERY  2014  .  REMOVAL AXILLA CYST Right 1990's    Medications: Prior to Admission medications   Medication Sig Start Date End Date Taking? Authorizing Provider  acetaminophen (TYLENOL) 500 MG tablet Take 1,000 mg by mouth every 6 (six) hours as needed for mild pain, moderate pain, fever or headache.   Yes [provider]  amLODipine (NORVASC) 10 MG tablet Take 10 mg by mouth daily with breakfast.    Yes [provider]  aspirin 81 MG EC tablet Take by mouth. 10/12/17  Yes [provider]  atorvastatin (LIPITOR) 40 MG tablet Take 1 tablet (40 mg total) daily by mouth. Patient taking differently: Take 20 mg by mouth daily.  10/11/17  Yes Nita Sells, MD  Cholecalciferol 1000 units capsule Take 5,000 Units by mouth daily.    Yes [provider]  gabapentin (NEURONTIN) 100 MG capsule Take 100-200 mg at bedtime as needed by mouth (nerve pain).    Yes [provider]  hydrALAZINE (APRESOLINE) 25 MG tablet Take 25 mg daily by mouth.    Yes [provider]  hydrochlorothiazide (HYDRODIURIL) 25 MG tablet Take 25 mg by mouth every morning. 11/26/19  Yes [provider]  Insulin Degludec (TRESIBA FLEXTOUCH) 200 UNIT/ML SOPN Inject 90 Units into the skin daily with supper.    Yes [provider]  liraglutide (VICTOZA) 18 MG/3ML SOPN Inject 1.2 mg daily into the skin.   Yes [provider]  loratadine (CLARITIN) 10 MG tablet Take 10 mg daily as needed by mouth (seasonal allergies).    Yes [provider]  losartan (COZAAR) 100 MG tablet Take 100 mg by mouth daily. 11/26/19  Yes [provider]  metoprolol succinate (TOPROL-XL) 50 MG 24 hr tablet Take 50 mg by mouth daily. 10/01/19  Yes [provider]  sertraline (ZOLOFT) 50 MG tablet Take 150 mg by mouth daily.    Yes [provider]  amoxicillin (AMOXIL) 500 MG capsule Take 1 capsule (500 mg total) by mouth 2 (two) times daily. Patient not  taking: Reported on 12/12/2019 12/03/18   Jaynee Eagles, PA-C  benzonatate (TESSALON) 100 MG capsule Take 1-2 capsules (100-200 mg total) by mouth 3 (three) times daily as needed. Patient not taking: Reported on 12/12/2019 12/03/18   Jaynee Eagles, PA-C  HYDROcodone-acetaminophen (NORCO/VICODIN) 5-325 MG tablet Take 1-2 tablets by mouth every 6 (six) hours as needed for moderate pain. Patient not taking: Reported on 12/12/2019 08/25/16   Kinsinger, Arta Bruce, MD  metoprolol tartrate (LOPRESSOR) 25 MG tablet Take 1 tablet (25 mg total) by mouth 2 (two) times daily. Patient not taking: Reported on 12/12/2019 03/13/19   Adrian Prows, MD    Allergies:   Allergies  Allergen Reactions  . Latex Anaphylaxis and Shortness Of Breath    : severe respiratory distress  . Onion Diarrhea  . Caffeine Other (See Comments)    Stomach pain from pepsi  . Lisinopril Cough  . Metformin  And Related Diarrhea  . Simvastatin Rash  . Tetracycline Rash  . Zithromax [Azithromycin] Rash    Social History:  reports that she has never smoked. She has never used smokeless tobacco. She reports that she does not drink alcohol or use drugs.  Family History: Family History  Problem Relation Age of Onset  . Stroke Mother   . Stroke Brother     Physical Exam: Blood pressure 119/60, pulse 72, temperature 98.1 F (36.7 C), temperature source Oral, resp. rate 16, height 5\' 3"  (1.6 m), weight 65.8 kg, SpO2 100 %. General: Alert, awake, oriented x3, in no acute distress. Eyes: pink conjunctiva,anicteric sclera, pupils equal and reactive to light and accomodation, HEENT: normocephalic, atraumatic, oropharynx clear, dry mucosal membranes Neck: supple, no masses or lymphadenopathy, no goiter, no bruits, no JVD CVS: Regular rate and rhythm, without murmurs, rubs or gallops. No lower extremity edema Resp : Clear to auscultation bilaterally, no wheezing, rales or rhonchi. GI : Soft, nontender, nondistended, positive bowel sounds, no  masses. No hernia.  Musculoskeletal: No clubbing or cyanosis, positive pedal pulses. No contracture. ROM intact  Neuro: Grossly intact, no focal neurological deficits, strength 5/5 upper and lower extremities bilaterally Psych: alert and oriented x 3, normal mood and affect Skin: no rashes or lesions, warm and dry   LABS on Admission: I have personally reviewed all the labs and imagings below    Basic Metabolic Panel: Recent Labs  Lab 12/12/19 0736  NA 137  K 3.6  CL 106  CO2 22  GLUCOSE 87  BUN 52*  CREATININE 2.44*  CALCIUM 9.3   Liver Function Tests: Recent Labs  Lab 12/12/19 0736  AST 38  ALT 39  ALKPHOS 78  BILITOT 0.9  PROT 7.2  ALBUMIN 4.1   Recent Labs  Lab 12/12/19 0736  LIPASE 37   No results for input(s): AMMONIA in the last 168 hours. CBC: Recent Labs  Lab 12/12/19 0736  WBC 7.7  HGB 11.9*  HCT 36.6  MCV 93.1  PLT 232   Cardiac Enzymes: No results for input(s): CKTOTAL, CKMB, CKMBINDEX, TROPONINI in the last 168 hours. BNP: Invalid input(s): POCBNP CBG: No results for input(s): GLUCAP in the last 168 hours.  Radiological Exams on Admission:  CT ABDOMEN PELVIS WO CONTRAST  Result Date: 12/12/2019 CLINICAL DATA:  Diarrhea and abdominal pain. History of diverticulitis. EXAM: CT ABDOMEN AND PELVIS WITHOUT CONTRAST TECHNIQUE: Multidetector CT imaging of the abdomen and pelvis was performed following the standard protocol without IV contrast. COMPARISON:  12/08/2015 FINDINGS: Lower chest: Mild dependent atelectasis. Trace anterior pericardial fluid. Hepatobiliary: No focal liver abnormality.Cholecystectomy which may account for chronic common bile duct dilatation to 13 mm, stable appearing Pancreas: Unremarkable. Spleen: Unremarkable. Adrenals/Urinary Tract: Negative adrenals. No hydronephrosis or stone. Unremarkable bladder. Stomach/Bowel: No obstruction. No appendicitis. Colonic diverticulosis without active inflammation. No colonic wall thickening.  Vascular/Lymphatic: No acute vascular abnormality. No mass or adenopathy. Reproductive:Hysterectomy with negative adnexa Other: No ascites or pneumoperitoneum. Trace pelvic fluid, nonspecific. Musculoskeletal: No acute abnormalities. Advanced spinal degeneration with levoscoliosis. IMPRESSION: No acute finding.  No evidence of diverticulitis or colitis. Electronically Signed   By: Monte Fantasia M.D.   On: 12/12/2019 10:32   DG Chest Portable 1 View  Result Date: 12/12/2019 CLINICAL DATA:  Chest pressure EXAM: PORTABLE CHEST 1 VIEW COMPARISON:  02/14/2017 FINDINGS: Generous heart size accentuated by low volumes. Negative mediastinal contours. Mild interstitial crowding. There is no edema, consolidation, effusion, or pneumothorax. Thoracolumbar scoliosis. IMPRESSION: Low volume chest without  acute finding. Electronically Signed   By: Monte Fantasia M.D.   On: 12/12/2019 08:14      EKG: Independently reviewed.  Rate 72, normal sinus rhythm   Assessment/Plan Principal Problem:   Acute kidney injury ([place]) secondary to dehydration and diarrhea superimposed on CKD stage III -Baseline creatinine 1.6-1.7 -Presented with BUN of 52, creatinine 2.4.  Likely prerenal secondary to dehydration, decreased renal perfusion due to hypotension, medications -Placed on IV fluid hydration.  If no significant improvement by tomorrow, obtain renal ultrasound. -Hold losartan, HCTZ and other antihypertensives due to borderline BP  Active Problems: Diarrhea, acute -Likely due to viral gastroenteritis.  COVID-19 is negative -CT abdomen did not show colitis or diverticulitis.  As patient reported antibiotic use in the last 3 months for dental work, will rule out C. difficile, also obtain GI pathogen panel -Currently no fevers, leukocytosis or abdominal pain. -Start on full liquid diet and advance as tolerated  History of CVA  (cerebral vascular accident) ([place]) -Currently no new neurological deficits. -Continue  aspirin, Lipitor  Essential hypertension -Borderline BP due to dehydration and diarrhea -For now hold all antihypertensives including amlodipine, hydralazine, HCTZ, losartan, Toprol-XL.  Gradually restart when BP allows.  Diabetes mellitus type 2, -On Tresiba 90 units at supper outpatient, will place on Levemir 40 units as patient is on full liquid diet only -Obtain hemoglobin A1c   DVT prophylaxis: Heparin subcu  CODE STATUS: Full CODE STATUS  Consults called: None  Family Communication: Admission, patients condition and plan of care including tests being ordered have been discussed with the patient  who indicates understanding and agree with the plan and Code Status  Admission status:   The medical decision making on this patient was of high complexity and the patient is at high risk for clinical deterioration, therefore this is a level 3 admission.  Severity of Illness:      The appropriate patient status for this patient is OBSERVATION. Observation status is judged to be reasonable and necessary in order to provide the required intensity of service to ensure the patient's safety. The patient's presenting symptoms, physical exam findings, and initial radiographic and laboratory data in the context of their medical condition is felt to place them at decreased risk for further clinical deterioration. Furthermore, it is anticipated that the patient will be medically stable for discharge from the hospital within 2 midnights of admission. The following factors support the patient status of observation.   " The patient's presenting symptoms include dehydration, diarrhea, acute kidney injury " The physical exam findings include dry mucosal membranes " The initial radiographic and laboratory data are acute kidney injury     Time Spent on Admission: 60 mins      Nickalos Petersen M.D. [facility] 12/12/2019, 11:57 AM

## 2019-12-13 DIAGNOSIS — E785 Hyperlipidemia, unspecified: Secondary | ICD-10-CM | POA: Diagnosis present

## 2019-12-13 DIAGNOSIS — Z20822 Contact with and (suspected) exposure to covid-19: Secondary | ICD-10-CM | POA: Diagnosis present

## 2019-12-13 DIAGNOSIS — E1122 Type 2 diabetes mellitus with diabetic chronic kidney disease: Secondary | ICD-10-CM | POA: Diagnosis present

## 2019-12-13 DIAGNOSIS — E860 Dehydration: Secondary | ICD-10-CM | POA: Diagnosis present

## 2019-12-13 DIAGNOSIS — M1990 Unspecified osteoarthritis, unspecified site: Secondary | ICD-10-CM | POA: Diagnosis present

## 2019-12-13 DIAGNOSIS — Z888 Allergy status to other drugs, medicaments and biological substances status: Secondary | ICD-10-CM | POA: Diagnosis not present

## 2019-12-13 DIAGNOSIS — Z91040 Latex allergy status: Secondary | ICD-10-CM | POA: Diagnosis not present

## 2019-12-13 DIAGNOSIS — Z7982 Long term (current) use of aspirin: Secondary | ICD-10-CM | POA: Diagnosis not present

## 2019-12-13 DIAGNOSIS — Z823 Family history of stroke: Secondary | ICD-10-CM | POA: Diagnosis not present

## 2019-12-13 DIAGNOSIS — N179 Acute kidney failure, unspecified: Secondary | ICD-10-CM | POA: Diagnosis present

## 2019-12-13 DIAGNOSIS — E1142 Type 2 diabetes mellitus with diabetic polyneuropathy: Secondary | ICD-10-CM | POA: Diagnosis present

## 2019-12-13 DIAGNOSIS — A084 Viral intestinal infection, unspecified: Secondary | ICD-10-CM | POA: Diagnosis present

## 2019-12-13 DIAGNOSIS — N1831 Chronic kidney disease, stage 3a: Secondary | ICD-10-CM | POA: Diagnosis present

## 2019-12-13 DIAGNOSIS — I959 Hypotension, unspecified: Secondary | ICD-10-CM | POA: Diagnosis present

## 2019-12-13 DIAGNOSIS — R197 Diarrhea, unspecified: Secondary | ICD-10-CM | POA: Diagnosis not present

## 2019-12-13 DIAGNOSIS — Z794 Long term (current) use of insulin: Secondary | ICD-10-CM | POA: Diagnosis not present

## 2019-12-13 DIAGNOSIS — Z79899 Other long term (current) drug therapy: Secondary | ICD-10-CM | POA: Diagnosis not present

## 2019-12-13 DIAGNOSIS — I129 Hypertensive chronic kidney disease with stage 1 through stage 4 chronic kidney disease, or unspecified chronic kidney disease: Secondary | ICD-10-CM | POA: Diagnosis present

## 2019-12-13 DIAGNOSIS — Z8673 Personal history of transient ischemic attack (TIA), and cerebral infarction without residual deficits: Secondary | ICD-10-CM | POA: Diagnosis not present

## 2019-12-13 DIAGNOSIS — Z91018 Allergy to other foods: Secondary | ICD-10-CM | POA: Diagnosis not present

## 2019-12-13 DIAGNOSIS — Z881 Allergy status to other antibiotic agents status: Secondary | ICD-10-CM | POA: Diagnosis not present

## 2019-12-13 LAB — CBC
HCT: 31.6 % — ABNORMAL LOW (ref 36.0–46.0)
Hemoglobin: 10 g/dL — ABNORMAL LOW (ref 12.0–15.0)
MCH: 29.8 pg (ref 26.0–34.0)
MCHC: 31.6 g/dL (ref 30.0–36.0)
MCV: 94 fL (ref 80.0–100.0)
Platelets: 199 10*3/uL (ref 150–400)
RBC: 3.36 MIL/uL — ABNORMAL LOW (ref 3.87–5.11)
RDW: 13.4 % (ref 11.5–15.5)
WBC: 5.7 10*3/uL (ref 4.0–10.5)
nRBC: 0 % (ref 0.0–0.2)

## 2019-12-13 LAB — BASIC METABOLIC PANEL
Anion gap: 8 (ref 5–15)
BUN: 42 mg/dL — ABNORMAL HIGH (ref 8–23)
CO2: 18 mmol/L — ABNORMAL LOW (ref 22–32)
Calcium: 8.6 mg/dL — ABNORMAL LOW (ref 8.9–10.3)
Chloride: 115 mmol/L — ABNORMAL HIGH (ref 98–111)
Creatinine, Ser: 1.81 mg/dL — ABNORMAL HIGH (ref 0.44–1.00)
GFR calc Af Amer: 30 mL/min — ABNORMAL LOW (ref >60)
GFR calc non Af Amer: 26 mL/min — ABNORMAL LOW (ref >60)
Glucose, Bld: 67 mg/dL — ABNORMAL LOW (ref 70–99)
Potassium: 3.7 mmol/L (ref 3.5–5.1)
Sodium: 141 mmol/L (ref 135–145)

## 2019-12-13 LAB — GLUCOSE, CAPILLARY
Glucose-Capillary: 109 mg/dL — ABNORMAL HIGH (ref 70–99)
Glucose-Capillary: 112 mg/dL — ABNORMAL HIGH (ref 70–99)
Glucose-Capillary: 157 mg/dL — ABNORMAL HIGH (ref 70–99)
Glucose-Capillary: 199 mg/dL — ABNORMAL HIGH (ref 70–99)

## 2019-12-13 MED ORDER — LACTATED RINGERS IV SOLN: INTRAVENOUS | Status: DC

## 2019-12-13 NOTE — Progress Notes (Signed)
RN called into room by patient c/o chest discomfort 2/10. Radiating to shoulder blades. Pt states "it doesn't feel like heartburn it feels like pressure". Vitals stable. EKG done shows NSR. Not transferring to epic, in patient's chart. On call notified. Will administer oxy and wait for new orders.   Today's Vitals   12/12/19 1425 12/12/19 2151 12/12/19 2221 12/13/19 0052  BP: 131/66 (!) 119/37  121/60  Pulse: 79 73  71  Resp: 20 18  20   Temp: 97.8 F (36.6 C) 98.1 F (36.7 C)  98.3 F (36.8 C)  TempSrc: Oral Oral  Oral  SpO2: 100% 100%  100%  Weight:      Height:      PainSc:   0-No pain 2    Body mass index is 25.69 kg/m.

## 2019-12-13 NOTE — Progress Notes (Signed)
Pt ambulated hall with RN after lunch today. Ambulated approximately 160 yd and tolerated well. Denied orthostatic hypotension symptoms such has dizziness, sob. BP post ambulation 130/62 , HR 83, O2 100% RA.

## 2019-12-13 NOTE — Progress Notes (Signed)
PROGRESS NOTE    Diana Harper  QIW:[id number] DOB: 26-Apr-1936 DOA: 12/12/2019 PCP: [name], MD    Brief Narrative:  84 year old female with history of hypertension, hyperlipidemia, insulin-dependent diabetes, prior history of stroke with no deficits, CKD stage III with baseline creatinine about 1.7, history of diverticulitis presented to the emergency room with diarrhea for last 3 days.  Patient had loss of appetite and nonbloody bilious vomiting the morning of presentation.  In the emergency room, afebrile.  Blood pressure is 98/55.  BUN 52/creatinine 2.44.  Admitted to the hospital with ongoing diarrhea causing acute kidney injury.  Assessment & Plan:   Principal Problem:   Acute kidney injury ([place]) Active Problems:   CVA (cerebral vascular accident) ([place])   Diarrhea   Dehydration   AKI (acute kidney injury) ([place])  Acute kidney injury secondary to dehydration and diarrhea superimposed on chronic kidney disease stage III: Baseline creatinine reported about 1.6-1.7. Treated with IV fluids with clinical improvement.  Renal functions improving.  Hold losartan hydrochlorothiazide.  Chloride is 117. Change IV fluids to Ringer lactate.  Recheck levels tomorrow morning.  Recheck other electrolytes including magnesium and phosphorus.  Acute diarrhea: Self resolving.  No loose stool for last 24 hours.  Discontinue C. difficile and GI pathogen panel testings.  Discontinue enteric precautions.  Advance to regular diet and mobilize.  Hypertension: Blood pressure stable.  Patient on multiple medications including amlodipine, hydrochlorothiazide, hydralazine and losartan.  Her blood pressures are still low normal.  Will resume beta-blockers to avoid rebound tachycardia.  Holding all other antihypertensives.  Type 2 diabetes: On insulin at home.  Hemoglobin A1c 13.  Recently changed insulin regimen.  Continues to be insulin in the hospital.  Once her oral intake improves, will increase  dose of insulin.  History of stroke with no deficits: Patient on aspirin.  She is on Lipitor that she will continue.  Challenged with regular diet, continue maintenance IV fluids, check intake and output.  Mobilize and check orthostatic blood pressures.  Due to significant symptoms, patient will still need IV fluids and monitoring in the hospital.  Anticipate to stay more than 2 midnights.  DVT prophylaxis: Heparin subcu Code Status: Full code Family Communication: None Disposition Plan: Anticipate home tomorrow.   Consultants:   None  Procedures:   None  Antimicrobials:   None   Subjective: Patient seen and examined.  Denies any nausea.  Had some chest discomfort last night but that is improved. She is eager to advance her diet, will challenge with regular diet. No more loose stool since last 24 hours.  No fever.  Objective: Vitals:   12/12/19 1425 12/12/19 2151 12/13/19 0052 12/13/19 0633  BP: 131/66 (!) 119/37 121/60 (!) 103/54  Pulse: 79 73 71 74  Resp: 20 18 20 14   Temp: 97.8 F (36.6 C) 98.1 F (36.7 C) 98.3 F (36.8 C) 98.6 F (37 C)  TempSrc: Oral Oral Oral Oral  SpO2: 100% 100% 100% 99%  Weight:      Height:        Intake/Output Summary (Last 24 hours) at 12/13/2019 1408 Last data filed at 12/13/2019 0600 Gross per 24 hour  Intake 1725.31 ml  Output 500 ml  Net 1225.31 ml   Filed Weights   12/12/19 0626  Weight: 65.8 kg    Examination:  General exam: Appears calm and comfortable, on room air. Respiratory system: Clear to auscultation. Respiratory effort normal. Cardiovascular system: S1 & S2 heard, RRR. No JVD, murmurs, rubs, gallops  or clicks. No pedal edema. Gastrointestinal system: Abdomen is nondistended, soft and nontender. No organomegaly or masses felt. Normal bowel sounds heard. Central nervous system: Alert and oriented. No focal neurological deficits. Extremities: Symmetric 5 x 5 power. Skin: No rashes, lesions or ulcers Psychiatry:  Judgement and insight appear normal. Mood & affect appropriate.     Data Reviewed: I have personally reviewed following labs and imaging studies  CBC: Recent Labs  Lab 12/12/19 0736 12/13/19 0502  WBC 7.7 5.7  HGB 11.9* 10.0*  HCT 36.6 31.6*  MCV 93.1 94.0  PLT 232 790   Basic Metabolic Panel: Recent Labs  Lab 12/12/19 0736 12/13/19 0502  NA 137 141  K 3.6 3.7  CL 106 115*  CO2 22 18*  GLUCOSE 87 67*  BUN 52* 42*  CREATININE 2.44* 1.81*  CALCIUM 9.3 8.6*   GFR: Estimated Creatinine Clearance: 22.2 mL/min (A) (by C-G formula based on SCr of 1.81 mg/dL (H)). Liver Function Tests: Recent Labs  Lab 12/12/19 0736  AST 38  ALT 39  ALKPHOS 78  BILITOT 0.9  PROT 7.2  ALBUMIN 4.1   Recent Labs  Lab 12/12/19 0736  LIPASE 37   No results for input(s): AMMONIA in the last 168 hours. Coagulation Profile: No results for input(s): INR, PROTIME in the last 168 hours. Cardiac Enzymes: No results for input(s): CKTOTAL, CKMB, CKMBINDEX, TROPONINI in the last 168 hours. BNP (last 3 results) No results for input(s): PROBNP in the last 8760 hours. HbA1C: Recent Labs    12/12/19 0736  HGBA1C 13.2*   CBG: Recent Labs  Lab 12/12/19 1702 12/12/19 2153 12/13/19 0802 12/13/19 1128  GLUCAP 170* 154* 109* 112*   Lipid Profile: No results for input(s): CHOL, HDL, LDLCALC, TRIG, CHOLHDL, LDLDIRECT in the last 72 hours. Thyroid Function Tests: No results for input(s): TSH, T4TOTAL, FREET4, T3FREE, THYROIDAB in the last 72 hours. Anemia Panel: No results for input(s): VITAMINB12, FOLATE, FERRITIN, TIBC, IRON, RETICCTPCT in the last 72 hours. Sepsis Labs: Recent Labs  Lab 12/12/19 0737  LATICACIDVEN 0.5    Recent Results (from the past 240 hour(s))  Respiratory Panel by RT PCR (Flu A&B, Covid) - Nasopharyngeal Swab     Status: None   Collection Time: 12/12/19  7:06 AM   Specimen: Nasopharyngeal Swab  Result Value Ref Range Status   SARS Coronavirus 2 by RT PCR  NEGATIVE NEGATIVE Final    Comment: (NOTE) SARS-CoV-2 target nucleic acids are NOT DETECTED. The SARS-CoV-2 RNA is generally detectable in upper respiratoy specimens during the acute phase of infection. The lowest concentration of SARS-CoV-2 viral copies this assay can detect is 131 copies/mL. A negative result does not preclude SARS-Cov-2 infection and should not be used as the sole basis for treatment or other patient management decisions. A negative result may occur with  improper specimen collection/handling, submission of specimen other than nasopharyngeal swab, presence of viral mutation(s) within the areas targeted by this assay, and inadequate number of viral copies (<131 copies/mL). A negative result must be combined with clinical observations, patient history, and epidemiological information. The expected result is Negative. Fact Sheet for Patients:  PinkCheek.be Fact Sheet for Healthcare Providers:  GravelBags.it This test is not yet ap proved or cleared by the [place] FDA and  has been authorized for detection and/or diagnosis of SARS-CoV-2 by FDA under an Emergency Use Authorization (EUA). This EUA will remain  in effect (meaning this test can be used) for the duration of the COVID-19 declaration under Section  564(b)(1) of the Act, 21 U.S.C. section 360bbb-3(b)(1), unless the authorization is terminated or revoked sooner.    Influenza A by PCR NEGATIVE NEGATIVE Final   Influenza B by PCR NEGATIVE NEGATIVE Final    Comment: (NOTE) The Xpert Xpress SARS-CoV-2/FLU/RSV assay is intended as an aid in  the diagnosis of influenza from Nasopharyngeal swab specimens and  should not be used as a sole basis for treatment. Nasal washings and  aspirates are unacceptable for Xpert Xpress SARS-CoV-2/FLU/RSV  testing. Fact Sheet for Patients: PinkCheek.be Fact Sheet for Healthcare  Providers: GravelBags.it This test is not yet approved or cleared by the [place] FDA and  has been authorized for detection and/or diagnosis of SARS-CoV-2 by  FDA under an Emergency Use Authorization (EUA). This EUA will remain  in effect (meaning this test can be used) for the duration of the  Covid-19 declaration under Section 564(b)(1) of the Act, 21  U.S.C. section 360bbb-3(b)(1), unless the authorization is  terminated or revoked. Performed at [facility], [place] [address] [place], [place] [address]   Blood Culture x 1     Status: None (Preliminary result)   Collection Time: 12/12/19  7:18 AM   Specimen: BLOOD  Result Value Ref Range Status   Specimen Description   Final    BLOOD LEFT ANTECUBITAL Performed at [facility] [address], [place], [place] [address]    Special Requests   Final    BOTTLES DRAWN AEROBIC AND ANAEROBIC Blood Culture results may not be optimal due to an excessive volume of blood received in culture bottles Performed at [facility] [address], [place], [place] [address]    Culture   Final    NO GROWTH 1 DAY Performed at [facility], [place] [address], [place], [place] [address]    Report Status PENDING  Incomplete         Radiology Studies: CT ABDOMEN PELVIS WO CONTRAST  Result Date: 12/12/2019 CLINICAL DATA:  Diarrhea and abdominal pain. History of diverticulitis. EXAM: CT ABDOMEN AND PELVIS WITHOUT CONTRAST TECHNIQUE: Multidetector CT imaging of the abdomen and pelvis was performed following the standard protocol without IV contrast. COMPARISON:  12/08/2015 FINDINGS: Lower chest: Mild dependent atelectasis. Trace anterior pericardial fluid. Hepatobiliary: No focal liver abnormality.Cholecystectomy which may account for chronic common bile duct dilatation to 13 mm, stable appearing Pancreas: Unremarkable. Spleen: Unremarkable. Adrenals/Urinary  Tract: Negative adrenals. No hydronephrosis or stone. Unremarkable bladder. Stomach/Bowel: No obstruction. No appendicitis. Colonic diverticulosis without active inflammation. No colonic wall thickening. Vascular/Lymphatic: No acute vascular abnormality. No mass or adenopathy. Reproductive:Hysterectomy with negative adnexa Other: No ascites or pneumoperitoneum. Trace pelvic fluid, nonspecific. Musculoskeletal: No acute abnormalities. Advanced spinal degeneration with levoscoliosis. IMPRESSION: No acute finding.  No evidence of diverticulitis or colitis. Electronically Signed   By: Monte Fantasia M.D.   On: 12/12/2019 10:32   DG Chest Portable 1 View  Result Date: 12/12/2019 CLINICAL DATA:  Chest pressure EXAM: PORTABLE CHEST 1 VIEW COMPARISON:  02/14/2017 FINDINGS: Generous heart size accentuated by low volumes. Negative mediastinal contours. Mild interstitial crowding. There is no edema, consolidation, effusion, or pneumothorax. Thoracolumbar scoliosis. IMPRESSION: Low volume chest without acute finding. Electronically Signed   By: Monte Fantasia M.D.   On: 12/12/2019 08:14        Scheduled Meds: . aspirin EC  81 mg Oral Daily  . atorvastatin  20 mg Oral Daily  . cholecalciferol  5,000 Units Oral Daily  . heparin  5,000 Units Subcutaneous Q8H  . insulin aspart  0-5 Units Subcutaneous QHS  . insulin aspart  0-9 Units Subcutaneous TID WC  . insulin detemir  40 Units Subcutaneous QHS  . sertraline  150 mg Oral Daily   Continuous Infusions: . lactated ringers 100 mL/hr at 12/13/19 0911     LOS: 0 days    Time spent: 30 minutes    Barb Merino, MD [facility] Pager [phone]

## 2019-12-14 LAB — BASIC METABOLIC PANEL
Anion gap: 8 (ref 5–15)
BUN: 32 mg/dL — ABNORMAL HIGH (ref 8–23)
CO2: 21 mmol/L — ABNORMAL LOW (ref 22–32)
Calcium: 8.8 mg/dL — ABNORMAL LOW (ref 8.9–10.3)
Chloride: 113 mmol/L — ABNORMAL HIGH (ref 98–111)
Creatinine, Ser: 1.47 mg/dL — ABNORMAL HIGH (ref 0.44–1.00)
GFR calc Af Amer: 38 mL/min — ABNORMAL LOW (ref >60)
GFR calc non Af Amer: 33 mL/min — ABNORMAL LOW (ref >60)
Glucose, Bld: 134 mg/dL — ABNORMAL HIGH (ref 70–99)
Potassium: 4.1 mmol/L (ref 3.5–5.1)
Sodium: 142 mmol/L (ref 135–145)

## 2019-12-14 LAB — PHOSPHORUS: Phosphorus: 2.5 mg/dL (ref 2.5–4.6)

## 2019-12-14 LAB — MAGNESIUM: Magnesium: 1.9 mg/dL (ref 1.7–2.4)

## 2019-12-14 LAB — GLUCOSE, CAPILLARY: Glucose-Capillary: 96 mg/dL (ref 70–99)

## 2019-12-14 NOTE — Discharge Summary (Signed)
Physician Discharge Summary  Diana Harper JSH:[id number] DOB: 1936/06/29 DOA: 12/12/2019  PCP: [name], MD  Admit date: 12/12/2019 Discharge date: 12/14/2019  Admitted From: Home Disposition: Home  Recommendations for Outpatient Follow-up:  1. Follow up with PCP in 1-2 weeks 2. Please obtain BMP/CBC in one week   Discharge Condition: Stable CODE STATUS: Full code Diet recommendation: Low-carb diet  Discharge summary: 84 year old female with history of hypertension, hyperlipidemia, insulin-dependent diabetes and history of prior stroke with no deficits, stage IIIa chronic kidney disease with baseline creatinine about 1.6-1.7 presented to the emergency room with diarrhea and poor appetite for last 3 days.  She also had loss of appetite and nonbloody bilious vomiting.  In the emergency room blood pressure was 98/55.  BUN 52, creatinine 2.44.  She was admitted to the hospital with ongoing diarrhea and acute kidney injury.  Patient was admitted to hospital and treated with IV fluids.  She had no more diarrhea after coming to the hospital.  Symptoms gradually improved.  Renal functions improved and creatinine is 1.47 today.  This is her historically based creatinine.  Appetite is normal.  Taking adequate liquids.  Blood pressure stabilized.  With adequate improvement, patient is going home, she will gradually resume all her blood pressure medications.  A1c is 13, blood sugars are poorly controlled but she recently had a change in her insulin.  No changes in medications done.  Discharge Diagnoses:  Principal Problem:   Acute kidney injury ([place]) Active Problems:   CVA (cerebral vascular accident) ([place])   Diarrhea   Dehydration   AKI (acute kidney injury) [facility])    Discharge Instructions  Discharge Instructions    Diet - low sodium heart healthy   Complete by: As directed    Drink plenty of water if having diarrhea   Discharge instructions   Complete by: As directed     Start taking your blood pressure medicines tomorrow 1/16 at home .   Increase activity slowly   Complete by: As directed      Allergies as of 12/14/2019      Reactions   Latex Anaphylaxis, Shortness Of Breath   : severe respiratory distress   Onion Diarrhea   Caffeine Other (See Comments)   Stomach pain from pepsi   Lisinopril Cough   Metformin And Related Diarrhea   Simvastatin Rash   Tetracycline Rash   Zithromax [azithromycin] Rash      Medication List    STOP taking these medications   amoxicillin 500 MG capsule Commonly known as: AMOXIL   benzonatate 100 MG capsule Commonly known as: TESSALON   HYDROcodone-acetaminophen 5-325 MG tablet Commonly known as: NORCO/VICODIN   metoprolol tartrate 25 MG tablet Commonly known as: LOPRESSOR     TAKE these medications   acetaminophen 500 MG tablet Commonly known as: TYLENOL Take 1,000 mg by mouth every 6 (six) hours as needed for mild pain, moderate pain, fever or headache.   amLODipine 10 MG tablet Commonly known as: NORVASC Take 10 mg by mouth daily with breakfast.   aspirin 81 MG EC tablet Take by mouth.   atorvastatin 40 MG tablet Commonly known as: Lipitor Take 1 tablet (40 mg total) daily by mouth. What changed: how much to take   Cholecalciferol 25 MCG (1000 UT) capsule Take 5,000 Units by mouth daily.   gabapentin 100 MG capsule Commonly known as: NEURONTIN Take 100-200 mg at bedtime as needed by mouth (nerve pain).   hydrALAZINE 25 MG tablet Commonly known as:  APRESOLINE Take 25 mg daily by mouth.   hydrochlorothiazide 25 MG tablet Commonly known as: HYDRODIURIL Take 25 mg by mouth every morning.   loratadine 10 MG tablet Commonly known as: CLARITIN Take 10 mg daily as needed by mouth (seasonal allergies).   losartan 100 MG tablet Commonly known as: COZAAR Take 100 mg by mouth daily.   metoprolol succinate 50 MG 24 hr tablet Commonly known as: TOPROL-XL Take 50 mg by mouth daily.    sertraline 50 MG tablet Commonly known as: ZOLOFT Take 150 mg by mouth daily.   Tyler Aas FlexTouch 200 UNIT/ML Sopn Generic drug: Insulin Degludec Inject 90 Units into the skin daily with supper.   Victoza 18 MG/3ML Sopn Generic drug: liraglutide Inject 1.2 mg daily into the skin.      Follow-up Information    [name], MD Follow up in 1 week(s).   Specialty: Family Medicine Why: Hospital follow- up with primary care provider. Contact information: [address] [place] [address] [phone]          Allergies  Allergen Reactions  . Latex Anaphylaxis and Shortness Of Breath    : severe respiratory distress  . Onion Diarrhea  . Caffeine Other (See Comments)    Stomach pain from pepsi  . Lisinopril Cough  . Metformin And Related Diarrhea  . Simvastatin Rash  . Tetracycline Rash  . Zithromax [Azithromycin] Rash     Procedures/Studies: CT ABDOMEN PELVIS WO CONTRAST  Result Date: 12/12/2019 CLINICAL DATA:  Diarrhea and abdominal pain. History of diverticulitis. EXAM: CT ABDOMEN AND PELVIS WITHOUT CONTRAST TECHNIQUE: Multidetector CT imaging of the abdomen and pelvis was performed following the standard protocol without IV contrast. COMPARISON:  12/08/2015 FINDINGS: Lower chest: Mild dependent atelectasis. Trace anterior pericardial fluid. Hepatobiliary: No focal liver abnormality.Cholecystectomy which may account for chronic common bile duct dilatation to 13 mm, stable appearing Pancreas: Unremarkable. Spleen: Unremarkable. Adrenals/Urinary Tract: Negative adrenals. No hydronephrosis or stone. Unremarkable bladder. Stomach/Bowel: No obstruction. No appendicitis. Colonic diverticulosis without active inflammation. No colonic wall thickening. Vascular/Lymphatic: No acute vascular abnormality. No mass or adenopathy. Reproductive:Hysterectomy with negative adnexa Other: No ascites or pneumoperitoneum. Trace pelvic fluid, nonspecific. Musculoskeletal: No  acute abnormalities. Advanced spinal degeneration with levoscoliosis. IMPRESSION: No acute finding.  No evidence of diverticulitis or colitis. Electronically Signed   By: Monte Fantasia M.D.   On: 12/12/2019 10:32   DG Chest Portable 1 View  Result Date: 12/12/2019 CLINICAL DATA:  Chest pressure EXAM: PORTABLE CHEST 1 VIEW COMPARISON:  02/14/2017 FINDINGS: Generous heart size accentuated by low volumes. Negative mediastinal contours. Mild interstitial crowding. There is no edema, consolidation, effusion, or pneumothorax. Thoracolumbar scoliosis. IMPRESSION: Low volume chest without acute finding. Electronically Signed   By: Monte Fantasia M.D.   On: 12/12/2019 08:14   Subjective: Patient seen and examined.  Ambulating around the hallway.  No dizziness lightheadedness.  Blood pressures adequate.  Passing gas but no loose stool per last 48 hours.  Able to eat and drink with no nausea.   Discharge Exam: Vitals:   12/13/19 2116 12/14/19 0534  BP: 130/70 (!) 128/58  Pulse: 80 73  Resp: 16 16  Temp: 98.4 F (36.9 C) 98.7 F (37.1 C)  SpO2: 99% 99%   Vitals:   12/13/19 1437 12/13/19 1750 12/13/19 2116 12/14/19 0534  BP: 124/61 130/62 130/70 (!) 128/58  Pulse: 71 83 80 73  Resp: 18  16 16   Temp: 98.2 F (36.8 C)  98.4 F (36.9 C)  98.7 F (37.1 C)  TempSrc: Oral     SpO2: 99% 100% 99% 99%  Weight:      Height:        General: Pt is alert, awake, not in acute distress Cardiovascular: RRR, S1/S2 +, no rubs, no gallops Respiratory: CTA bilaterally, no wheezing, no rhonchi Abdominal: Soft, NT, ND, bowel sounds + Extremities: no edema, no cyanosis    The results of significant diagnostics from this hospitalization (including imaging, microbiology, ancillary and laboratory) are listed below for reference.     Microbiology: Recent Results (from the past 240 hour(s))  Respiratory Panel by RT PCR (Flu A&B, Covid) - Nasopharyngeal Swab     Status: None   Collection Time: 12/12/19   7:06 AM   Specimen: Nasopharyngeal Swab  Result Value Ref Range Status   SARS Coronavirus 2 by RT PCR NEGATIVE NEGATIVE Final    Comment: (NOTE) SARS-CoV-2 target nucleic acids are NOT DETECTED. The SARS-CoV-2 RNA is generally detectable in upper respiratoy specimens during the acute phase of infection. The lowest concentration of SARS-CoV-2 viral copies this assay can detect is 131 copies/mL. A negative result does not preclude SARS-Cov-2 infection and should not be used as the sole basis for treatment or other patient management decisions. A negative result may occur with  improper specimen collection/handling, submission of specimen other than nasopharyngeal swab, presence of viral mutation(s) within the areas targeted by this assay, and inadequate number of viral copies (<131 copies/mL). A negative result must be combined with clinical observations, patient history, and epidemiological information. The expected result is Negative. Fact Sheet for Patients:  PinkCheek.be Fact Sheet for Healthcare Providers:  GravelBags.it This test is not yet ap proved or cleared by the [place] FDA and  has been authorized for detection and/or diagnosis of SARS-CoV-2 by FDA under an Emergency Use Authorization (EUA). This EUA will remain  in effect (meaning this test can be used) for the duration of the COVID-19 declaration under Section 564(b)(1) of the Act, 21 U.S.C. section 360bbb-3(b)(1), unless the authorization is terminated or revoked sooner.    Influenza A by PCR NEGATIVE NEGATIVE Final   Influenza B by PCR NEGATIVE NEGATIVE Final    Comment: (NOTE) The Xpert Xpress SARS-CoV-2/FLU/RSV assay is intended as an aid in  the diagnosis of influenza from Nasopharyngeal swab specimens and  should not be used as a sole basis for treatment. Nasal washings and  aspirates are unacceptable for Xpert Xpress SARS-CoV-2/FLU/RSV   testing. Fact Sheet for Patients: PinkCheek.be Fact Sheet for Healthcare Providers: GravelBags.it This test is not yet approved or cleared by the [place] FDA and  has been authorized for detection and/or diagnosis of SARS-CoV-2 by  FDA under an Emergency Use Authorization (EUA). This EUA will remain  in effect (meaning this test can be used) for the duration of the  Covid-19 declaration under Section 564(b)(1) of the Act, 21  U.S.C. section 360bbb-3(b)(1), unless the authorization is  terminated or revoked. Performed at [facility], [place] [address] [place], [place] [address]   Blood Culture x 1     Status: None (Preliminary result)   Collection Time: 12/12/19  7:18 AM   Specimen: BLOOD  Result Value Ref Range Status   Specimen Description   Final    BLOOD LEFT ANTECUBITAL Performed at [place] [address], [place], [place] [address]    Special Requests   Final    BOTTLES DRAWN AEROBIC AND ANAEROBIC Blood Culture results may  not be optimal due to an excessive volume of blood received in culture bottles Performed at [place] [address], [place], [place] [address]    Culture   Final    NO GROWTH 1 DAY Performed at [facility], [place] [address], [place], [place] [address]    Report Status PENDING  Incomplete     Labs: BNP (last 3 results) No results for input(s): BNP in the last 8760 hours. Basic Metabolic Panel: Recent Labs  Lab 12/12/19 0736 12/13/19 0502 12/14/19 0511  NA 137 141 142  K 3.6 3.7 4.1  CL 106 115* 113*  CO2 22 18* 21*  GLUCOSE 87 67* 134*  BUN 52* 42* 32*  CREATININE 2.44* 1.81* 1.47*  CALCIUM 9.3 8.6* 8.8*  MG  --   --  1.9  PHOS  --   --  2.5   Liver Function Tests: Recent Labs  Lab 12/12/19 0736  AST 38  ALT 39  ALKPHOS 78  BILITOT 0.9  PROT 7.2  ALBUMIN 4.1   Recent Labs  Lab 12/12/19 0736   LIPASE 37   No results for input(s): AMMONIA in the last 168 hours. CBC: Recent Labs  Lab 12/12/19 0736 12/13/19 0502  WBC 7.7 5.7  HGB 11.9* 10.0*  HCT 36.6 31.6*  MCV 93.1 94.0  PLT 232 199   Cardiac Enzymes: No results for input(s): CKTOTAL, CKMB, CKMBINDEX, TROPONINI in the last 168 hours. BNP: Invalid input(s): POCBNP CBG: Recent Labs  Lab 12/13/19 0802 12/13/19 1128 12/13/19 1656 12/13/19 2120 12/14/19 0758  GLUCAP 109* 112* 157* 199* 96   D-Dimer No results for input(s): DDIMER in the last 72 hours. Hgb A1c Recent Labs    12/12/19 0736  HGBA1C 13.2*   Lipid Profile No results for input(s): CHOL, HDL, LDLCALC, TRIG, CHOLHDL, LDLDIRECT in the last 72 hours. Thyroid function studies No results for input(s): TSH, T4TOTAL, T3FREE, THYROIDAB in the last 72 hours.  Invalid input(s): FREET3 Anemia work up No results for input(s): VITAMINB12, FOLATE, FERRITIN, TIBC, IRON, RETICCTPCT in the last 72 hours. Urinalysis    Component Value Date/Time   COLORURINE STRAW (A) 10/10/2017 1830   APPEARANCEUR CLEAR 10/10/2017 1830   LABSPEC 1.009 10/10/2017 1830   PHURINE 6.0 10/10/2017 1830   GLUCOSEU NEGATIVE 10/10/2017 1830   HGBUR NEGATIVE 10/10/2017 1830   BILIRUBINUR NEGATIVE 10/10/2017 1830   KETONESUR NEGATIVE 10/10/2017 1830   PROTEINUR NEGATIVE 10/10/2017 1830   UROBILINOGEN 0.2 04/15/2012 1217   NITRITE NEGATIVE 10/10/2017 1830   LEUKOCYTESUR SMALL (A) 10/10/2017 1830   Sepsis Labs Invalid input(s): PROCALCITONIN,  WBC,  LACTICIDVEN Microbiology Recent Results (from the past 240 hour(s))  Respiratory Panel by RT PCR (Flu A&B, Covid) - Nasopharyngeal Swab     Status: None   Collection Time: 12/12/19  7:06 AM   Specimen: Nasopharyngeal Swab  Result Value Ref Range Status   SARS Coronavirus 2 by RT PCR NEGATIVE NEGATIVE Final    Comment: (NOTE) SARS-CoV-2 target nucleic acids are NOT DETECTED. The SARS-CoV-2 RNA is generally detectable in upper  respiratoy specimens during the acute phase of infection. The lowest concentration of SARS-CoV-2 viral copies this assay can detect is 131 copies/mL. A negative result does not preclude SARS-Cov-2 infection and should not be used as the sole basis for treatment or other patient management decisions. A negative result may occur with  improper specimen collection/handling, submission of specimen other than nasopharyngeal swab, presence of viral mutation(s) within the areas targeted by  this assay, and inadequate number of viral copies (<131 copies/mL). A negative result must be combined with clinical observations, patient history, and epidemiological information. The expected result is Negative. Fact Sheet for Patients:  PinkCheek.be Fact Sheet for Healthcare Providers:  GravelBags.it This test is not yet ap proved or cleared by the [place] FDA and  has been authorized for detection and/or diagnosis of SARS-CoV-2 by FDA under an Emergency Use Authorization (EUA). This EUA will remain  in effect (meaning this test can be used) for the duration of the COVID-19 declaration under Section 564(b)(1) of the Act, 21 U.S.C. section 360bbb-3(b)(1), unless the authorization is terminated or revoked sooner.    Influenza A by PCR NEGATIVE NEGATIVE Final   Influenza B by PCR NEGATIVE NEGATIVE Final    Comment: (NOTE) The Xpert Xpress SARS-CoV-2/FLU/RSV assay is intended as an aid in  the diagnosis of influenza from Nasopharyngeal swab specimens and  should not be used as a sole basis for treatment. Nasal washings and  aspirates are unacceptable for Xpert Xpress SARS-CoV-2/FLU/RSV  testing. Fact Sheet for Patients: PinkCheek.be Fact Sheet for Healthcare Providers: GravelBags.it This test is not yet approved or cleared by the [place] FDA and  has been authorized for  detection and/or diagnosis of SARS-CoV-2 by  FDA under an Emergency Use Authorization (EUA). This EUA will remain  in effect (meaning this test can be used) for the duration of the  Covid-19 declaration under Section 564(b)(1) of the Act, 21  U.S.C. section 360bbb-3(b)(1), unless the authorization is  terminated or revoked. Performed at [facility], [place] [address] [place], [place] [address]   Blood Culture x 1     Status: None (Preliminary result)   Collection Time: 12/12/19  7:18 AM   Specimen: BLOOD  Result Value Ref Range Status   Specimen Description   Final    BLOOD LEFT ANTECUBITAL Performed at [facility] [address] [place], [place] [address]    Special Requests   Final    BOTTLES DRAWN AEROBIC AND ANAEROBIC Blood Culture results may not be optimal due to an excessive volume of blood received in culture bottles Performed at [facility] [address] [place], [place] [address]    Culture   Final    NO GROWTH 1 DAY Performed at [facility], [place] [address] [place], [place] [address]    Report Status PENDING  Incomplete     Time coordinating discharge:  35 minutes  SIGNED:   Barb Merino, MD  [facility] 12/14/2019, 1:16 PM

## 2019-12-14 NOTE — Progress Notes (Signed)
Pt is being discharged home. Discharge instructions including medications and follow appts given. Pt has no further questions at this time.

## 2019-12-17 LAB — CULTURE, BLOOD (SINGLE): Culture: NO GROWTH

## 2019-12-21 DIAGNOSIS — I10 Essential (primary) hypertension: Secondary | ICD-10-CM | POA: Diagnosis not present

## 2019-12-21 DIAGNOSIS — R197 Diarrhea, unspecified: Secondary | ICD-10-CM | POA: Diagnosis not present

## 2019-12-21 DIAGNOSIS — R1110 Vomiting, unspecified: Secondary | ICD-10-CM | POA: Diagnosis not present

## 2019-12-21 DIAGNOSIS — E1165 Type 2 diabetes mellitus with hyperglycemia: Secondary | ICD-10-CM | POA: Diagnosis not present

## 2019-12-27 DIAGNOSIS — E1165 Type 2 diabetes mellitus with hyperglycemia: Secondary | ICD-10-CM | POA: Diagnosis not present

## 2019-12-27 DIAGNOSIS — Z794 Long term (current) use of insulin: Secondary | ICD-10-CM | POA: Diagnosis not present

## 2019-12-27 DIAGNOSIS — E1121 Type 2 diabetes mellitus with diabetic nephropathy: Secondary | ICD-10-CM | POA: Diagnosis not present

## 2019-12-28 DIAGNOSIS — E1140 Type 2 diabetes mellitus with diabetic neuropathy, unspecified: Secondary | ICD-10-CM | POA: Diagnosis not present

## 2019-12-28 DIAGNOSIS — E1121 Type 2 diabetes mellitus with diabetic nephropathy: Secondary | ICD-10-CM | POA: Diagnosis not present

## 2019-12-28 DIAGNOSIS — Z794 Long term (current) use of insulin: Secondary | ICD-10-CM | POA: Diagnosis not present

## 2019-12-28 DIAGNOSIS — E1165 Type 2 diabetes mellitus with hyperglycemia: Secondary | ICD-10-CM | POA: Diagnosis not present

## 2020-01-23 DIAGNOSIS — E1122 Type 2 diabetes mellitus with diabetic chronic kidney disease: Secondary | ICD-10-CM | POA: Diagnosis not present

## 2020-01-23 DIAGNOSIS — E1165 Type 2 diabetes mellitus with hyperglycemia: Secondary | ICD-10-CM | POA: Diagnosis not present

## 2020-01-23 DIAGNOSIS — G459 Transient cerebral ischemic attack, unspecified: Secondary | ICD-10-CM | POA: Diagnosis not present

## 2020-01-23 DIAGNOSIS — I129 Hypertensive chronic kidney disease with stage 1 through stage 4 chronic kidney disease, or unspecified chronic kidney disease: Secondary | ICD-10-CM | POA: Diagnosis not present

## 2020-01-23 DIAGNOSIS — E11319 Type 2 diabetes mellitus with unspecified diabetic retinopathy without macular edema: Secondary | ICD-10-CM | POA: Diagnosis not present

## 2020-01-23 DIAGNOSIS — E1129 Type 2 diabetes mellitus with other diabetic kidney complication: Secondary | ICD-10-CM | POA: Diagnosis not present

## 2020-01-23 DIAGNOSIS — E039 Hypothyroidism, unspecified: Secondary | ICD-10-CM | POA: Diagnosis not present

## 2020-01-23 DIAGNOSIS — N179 Acute kidney failure, unspecified: Secondary | ICD-10-CM | POA: Diagnosis not present

## 2020-01-23 DIAGNOSIS — N2581 Secondary hyperparathyroidism of renal origin: Secondary | ICD-10-CM | POA: Diagnosis not present

## 2020-01-23 DIAGNOSIS — D638 Anemia in other chronic diseases classified elsewhere: Secondary | ICD-10-CM | POA: Diagnosis not present

## 2020-01-23 DIAGNOSIS — N1832 Chronic kidney disease, stage 3b: Secondary | ICD-10-CM | POA: Diagnosis not present

## 2020-01-28 DIAGNOSIS — I10 Essential (primary) hypertension: Secondary | ICD-10-CM | POA: Diagnosis not present

## 2020-01-28 DIAGNOSIS — N289 Disorder of kidney and ureter, unspecified: Secondary | ICD-10-CM | POA: Diagnosis not present

## 2020-01-28 DIAGNOSIS — E1121 Type 2 diabetes mellitus with diabetic nephropathy: Secondary | ICD-10-CM | POA: Diagnosis not present

## 2020-01-28 DIAGNOSIS — E1165 Type 2 diabetes mellitus with hyperglycemia: Secondary | ICD-10-CM | POA: Diagnosis not present

## 2020-01-29 DIAGNOSIS — E1165 Type 2 diabetes mellitus with hyperglycemia: Secondary | ICD-10-CM | POA: Diagnosis not present

## 2020-01-29 DIAGNOSIS — E1140 Type 2 diabetes mellitus with diabetic neuropathy, unspecified: Secondary | ICD-10-CM | POA: Diagnosis not present

## 2020-01-29 DIAGNOSIS — Z794 Long term (current) use of insulin: Secondary | ICD-10-CM | POA: Diagnosis not present

## 2020-01-29 DIAGNOSIS — E1121 Type 2 diabetes mellitus with diabetic nephropathy: Secondary | ICD-10-CM | POA: Diagnosis not present

## 2020-02-03 ENCOUNTER — Ambulatory Visit: Payer: Medicare Other | Attending: Internal Medicine

## 2020-02-03 DIAGNOSIS — Z23 Encounter for immunization: Secondary | ICD-10-CM | POA: Insufficient documentation

## 2020-02-03 NOTE — Progress Notes (Signed)
   Covid-19 Vaccination Clinic  Name:  Magdalene Tardiff    MRN: [id number] DOB: 1936/05/10  02/03/2020  Ms. Mclaine was observed post Covid-19 immunization for 15 minutes without incident. She was provided with Vaccine Information Sheet and instruction to access the V-Safe system.   Ms. Zertuche was instructed to call 911 with any severe reactions post vaccine: [name] Difficulty breathing  . Swelling of face and throat  . A fast heartbeat  . A bad rash all over body  . Dizziness and weakness   Immunizations Administered    Name Date Dose VIS Date Route   Pfizer COVID-19 Vaccine 02/03/2020 11:26 AM 0.3 mL 11/09/2019 Intramuscular   Manufacturer: [place]   Lot: [id number]   [place]: [id number]

## 2020-03-03 ENCOUNTER — Ambulatory Visit: Payer: Medicare Other | Attending: Internal Medicine

## 2020-03-03 DIAGNOSIS — Z23 Encounter for immunization: Secondary | ICD-10-CM

## 2020-03-03 NOTE — Progress Notes (Signed)
   Covid-19 Vaccination Clinic  Name:  Darlette Dubow    MRN: [id number] DOB: 1936-08-02  03/03/2020  Ms. Buzan was observed post Covid-19 immunization for 15 minutes without incident. She was provided with Vaccine Information Sheet and instruction to access the V-Safe system.   Ms. Messerschmidt was instructed to call 911 with any severe reactions post vaccine: [name] Difficulty breathing  . Swelling of face and throat  . A fast heartbeat  . A bad rash all over body  . Dizziness and weakness   Immunizations Administered    Name Date Dose VIS Date Route   Pfizer COVID-19 Vaccine 03/03/2020 11:12 AM 0.3 mL 11/09/2019 Intramuscular   Manufacturer: [place]   Lot: [id number]   [place]: [id number]

## 2020-03-04 ENCOUNTER — Ambulatory Visit: Payer: Medicare Other

## 2020-05-14 DIAGNOSIS — Z794 Long term (current) use of insulin: Secondary | ICD-10-CM | POA: Diagnosis not present

## 2020-05-14 DIAGNOSIS — E1140 Type 2 diabetes mellitus with diabetic neuropathy, unspecified: Secondary | ICD-10-CM | POA: Diagnosis not present

## 2020-05-14 DIAGNOSIS — E1121 Type 2 diabetes mellitus with diabetic nephropathy: Secondary | ICD-10-CM | POA: Diagnosis not present

## 2020-05-14 DIAGNOSIS — E1165 Type 2 diabetes mellitus with hyperglycemia: Secondary | ICD-10-CM | POA: Diagnosis not present

## 2020-06-27 DIAGNOSIS — E1142 Type 2 diabetes mellitus with diabetic polyneuropathy: Secondary | ICD-10-CM | POA: Diagnosis not present

## 2020-06-27 DIAGNOSIS — K219 Gastro-esophageal reflux disease without esophagitis: Secondary | ICD-10-CM | POA: Diagnosis not present

## 2020-06-27 DIAGNOSIS — M542 Cervicalgia: Secondary | ICD-10-CM | POA: Diagnosis not present

## 2020-07-15 DIAGNOSIS — K219 Gastro-esophageal reflux disease without esophagitis: Secondary | ICD-10-CM | POA: Diagnosis not present

## 2020-07-17 DIAGNOSIS — D638 Anemia in other chronic diseases classified elsewhere: Secondary | ICD-10-CM | POA: Diagnosis not present

## 2020-07-17 DIAGNOSIS — E039 Hypothyroidism, unspecified: Secondary | ICD-10-CM | POA: Diagnosis not present

## 2020-07-17 DIAGNOSIS — E1129 Type 2 diabetes mellitus with other diabetic kidney complication: Secondary | ICD-10-CM | POA: Diagnosis not present

## 2020-07-17 DIAGNOSIS — E1165 Type 2 diabetes mellitus with hyperglycemia: Secondary | ICD-10-CM | POA: Diagnosis not present

## 2020-07-17 DIAGNOSIS — I129 Hypertensive chronic kidney disease with stage 1 through stage 4 chronic kidney disease, or unspecified chronic kidney disease: Secondary | ICD-10-CM | POA: Diagnosis not present

## 2020-07-17 DIAGNOSIS — N1832 Chronic kidney disease, stage 3b: Secondary | ICD-10-CM | POA: Diagnosis not present

## 2020-07-17 DIAGNOSIS — E1122 Type 2 diabetes mellitus with diabetic chronic kidney disease: Secondary | ICD-10-CM | POA: Diagnosis not present

## 2020-07-17 DIAGNOSIS — N2581 Secondary hyperparathyroidism of renal origin: Secondary | ICD-10-CM | POA: Diagnosis not present

## 2020-07-17 DIAGNOSIS — E11319 Type 2 diabetes mellitus with unspecified diabetic retinopathy without macular edema: Secondary | ICD-10-CM | POA: Diagnosis not present

## 2020-07-17 DIAGNOSIS — N179 Acute kidney failure, unspecified: Secondary | ICD-10-CM | POA: Diagnosis not present

## 2020-07-18 DIAGNOSIS — K219 Gastro-esophageal reflux disease without esophagitis: Secondary | ICD-10-CM | POA: Diagnosis not present

## 2020-07-18 DIAGNOSIS — R198 Other specified symptoms and signs involving the digestive system and abdomen: Secondary | ICD-10-CM | POA: Diagnosis not present

## 2020-07-18 DIAGNOSIS — I10 Essential (primary) hypertension: Secondary | ICD-10-CM | POA: Diagnosis not present

## 2020-07-18 DIAGNOSIS — R109 Unspecified abdominal pain: Secondary | ICD-10-CM | POA: Diagnosis not present

## 2020-07-18 DIAGNOSIS — R141 Gas pain: Secondary | ICD-10-CM | POA: Diagnosis not present

## 2020-07-18 DIAGNOSIS — E1121 Type 2 diabetes mellitus with diabetic nephropathy: Secondary | ICD-10-CM | POA: Diagnosis not present

## 2020-07-18 DIAGNOSIS — E1165 Type 2 diabetes mellitus with hyperglycemia: Secondary | ICD-10-CM | POA: Diagnosis not present

## 2020-08-14 DIAGNOSIS — Z794 Long term (current) use of insulin: Secondary | ICD-10-CM | POA: Diagnosis not present

## 2020-08-14 DIAGNOSIS — Z23 Encounter for immunization: Secondary | ICD-10-CM | POA: Diagnosis not present

## 2020-08-14 DIAGNOSIS — E1165 Type 2 diabetes mellitus with hyperglycemia: Secondary | ICD-10-CM | POA: Diagnosis not present

## 2020-08-14 DIAGNOSIS — E1140 Type 2 diabetes mellitus with diabetic neuropathy, unspecified: Secondary | ICD-10-CM | POA: Diagnosis not present

## 2020-08-14 DIAGNOSIS — E1121 Type 2 diabetes mellitus with diabetic nephropathy: Secondary | ICD-10-CM | POA: Diagnosis not present

## 2020-08-15 ENCOUNTER — Other Ambulatory Visit: Payer: Self-pay | Admitting: Family Medicine

## 2020-08-15 DIAGNOSIS — R109 Unspecified abdominal pain: Secondary | ICD-10-CM

## 2020-08-15 DIAGNOSIS — E1142 Type 2 diabetes mellitus with diabetic polyneuropathy: Secondary | ICD-10-CM | POA: Diagnosis not present

## 2020-08-15 DIAGNOSIS — E1165 Type 2 diabetes mellitus with hyperglycemia: Secondary | ICD-10-CM | POA: Diagnosis not present

## 2020-08-15 DIAGNOSIS — E1121 Type 2 diabetes mellitus with diabetic nephropathy: Secondary | ICD-10-CM | POA: Diagnosis not present

## 2020-08-15 DIAGNOSIS — K219 Gastro-esophageal reflux disease without esophagitis: Secondary | ICD-10-CM | POA: Diagnosis not present

## 2020-08-19 ENCOUNTER — Other Ambulatory Visit: Payer: Self-pay | Admitting: Family Medicine

## 2020-08-19 ENCOUNTER — Ambulatory Visit
Admission: RE | Admit: 2020-08-19 | Discharge: 2020-08-19 | Disposition: A | Discharge status: Home / Self Care | Payer: Medicare Other | Source: Ambulatory Visit | Attending: Family Medicine | Admitting: Family Medicine

## 2020-08-19 DIAGNOSIS — R109 Unspecified abdominal pain: Secondary | ICD-10-CM

## 2020-08-19 DIAGNOSIS — K5730 Diverticulosis of large intestine without perforation or abscess without bleeding: Secondary | ICD-10-CM | POA: Diagnosis not present

## 2020-08-26 DIAGNOSIS — E782 Mixed hyperlipidemia: Secondary | ICD-10-CM | POA: Diagnosis not present

## 2020-08-26 DIAGNOSIS — E1165 Type 2 diabetes mellitus with hyperglycemia: Secondary | ICD-10-CM | POA: Diagnosis not present

## 2020-08-26 DIAGNOSIS — E1121 Type 2 diabetes mellitus with diabetic nephropathy: Secondary | ICD-10-CM | POA: Diagnosis not present

## 2020-08-26 DIAGNOSIS — R195 Other fecal abnormalities: Secondary | ICD-10-CM | POA: Diagnosis not present

## 2020-08-29 DIAGNOSIS — E782 Mixed hyperlipidemia: Secondary | ICD-10-CM | POA: Diagnosis not present

## 2020-08-29 DIAGNOSIS — R7989 Other specified abnormal findings of blood chemistry: Secondary | ICD-10-CM | POA: Diagnosis not present

## 2020-08-29 DIAGNOSIS — E0590 Thyrotoxicosis, unspecified without thyrotoxic crisis or storm: Secondary | ICD-10-CM | POA: Diagnosis not present

## 2020-08-29 DIAGNOSIS — N289 Disorder of kidney and ureter, unspecified: Secondary | ICD-10-CM | POA: Diagnosis not present

## 2020-09-02 DIAGNOSIS — K529 Noninfective gastroenteritis and colitis, unspecified: Secondary | ICD-10-CM | POA: Diagnosis not present

## 2020-09-02 DIAGNOSIS — E782 Mixed hyperlipidemia: Secondary | ICD-10-CM | POA: Diagnosis not present

## 2020-09-02 DIAGNOSIS — Z23 Encounter for immunization: Secondary | ICD-10-CM | POA: Diagnosis not present

## 2020-09-02 DIAGNOSIS — K219 Gastro-esophageal reflux disease without esophagitis: Secondary | ICD-10-CM | POA: Diagnosis not present

## 2020-09-02 DIAGNOSIS — E1165 Type 2 diabetes mellitus with hyperglycemia: Secondary | ICD-10-CM | POA: Diagnosis not present

## 2020-09-02 DIAGNOSIS — R109 Unspecified abdominal pain: Secondary | ICD-10-CM | POA: Diagnosis not present

## 2020-09-05 DIAGNOSIS — E1121 Type 2 diabetes mellitus with diabetic nephropathy: Secondary | ICD-10-CM | POA: Diagnosis not present

## 2020-09-05 DIAGNOSIS — E1165 Type 2 diabetes mellitus with hyperglycemia: Secondary | ICD-10-CM | POA: Diagnosis not present

## 2020-09-19 DIAGNOSIS — E1169 Type 2 diabetes mellitus with other specified complication: Secondary | ICD-10-CM | POA: Diagnosis not present

## 2020-09-19 DIAGNOSIS — E1165 Type 2 diabetes mellitus with hyperglycemia: Secondary | ICD-10-CM | POA: Diagnosis not present

## 2020-09-19 DIAGNOSIS — E782 Mixed hyperlipidemia: Secondary | ICD-10-CM | POA: Diagnosis not present

## 2020-09-19 DIAGNOSIS — I152 Hypertension secondary to endocrine disorders: Secondary | ICD-10-CM | POA: Diagnosis not present

## 2020-09-19 DIAGNOSIS — E1159 Type 2 diabetes mellitus with other circulatory complications: Secondary | ICD-10-CM | POA: Diagnosis not present

## 2020-09-29 DIAGNOSIS — I10 Essential (primary) hypertension: Secondary | ICD-10-CM | POA: Diagnosis not present

## 2020-09-29 DIAGNOSIS — E0590 Thyrotoxicosis, unspecified without thyrotoxic crisis or storm: Secondary | ICD-10-CM | POA: Diagnosis not present

## 2020-09-29 DIAGNOSIS — E782 Mixed hyperlipidemia: Secondary | ICD-10-CM | POA: Diagnosis not present

## 2020-10-01 DIAGNOSIS — M791 Myalgia, unspecified site: Secondary | ICD-10-CM | POA: Diagnosis not present

## 2020-10-01 DIAGNOSIS — Z789 Other specified health status: Secondary | ICD-10-CM | POA: Diagnosis not present

## 2020-10-01 DIAGNOSIS — F411 Generalized anxiety disorder: Secondary | ICD-10-CM | POA: Diagnosis not present

## 2020-10-01 DIAGNOSIS — E1165 Type 2 diabetes mellitus with hyperglycemia: Secondary | ICD-10-CM | POA: Diagnosis not present

## 2020-10-01 DIAGNOSIS — I10 Essential (primary) hypertension: Secondary | ICD-10-CM | POA: Diagnosis not present

## 2020-10-01 DIAGNOSIS — E782 Mixed hyperlipidemia: Secondary | ICD-10-CM | POA: Diagnosis not present

## 2020-10-02 DIAGNOSIS — K219 Gastro-esophageal reflux disease without esophagitis: Secondary | ICD-10-CM | POA: Diagnosis not present

## 2020-10-02 DIAGNOSIS — R195 Other fecal abnormalities: Secondary | ICD-10-CM | POA: Diagnosis not present

## 2020-10-02 DIAGNOSIS — R197 Diarrhea, unspecified: Secondary | ICD-10-CM | POA: Diagnosis not present

## 2020-10-20 DIAGNOSIS — E663 Overweight: Secondary | ICD-10-CM | POA: Diagnosis not present

## 2020-10-20 DIAGNOSIS — E782 Mixed hyperlipidemia: Secondary | ICD-10-CM | POA: Diagnosis not present

## 2020-10-20 DIAGNOSIS — E1165 Type 2 diabetes mellitus with hyperglycemia: Secondary | ICD-10-CM | POA: Diagnosis not present

## 2020-11-03 DIAGNOSIS — Z1339 Encounter for screening examination for other mental health and behavioral disorders: Secondary | ICD-10-CM | POA: Diagnosis not present

## 2020-11-03 DIAGNOSIS — Z1331 Encounter for screening for depression: Secondary | ICD-10-CM | POA: Diagnosis not present

## 2020-11-03 DIAGNOSIS — Z0000 Encounter for general adult medical examination without abnormal findings: Secondary | ICD-10-CM | POA: Diagnosis not present

## 2020-11-06 DIAGNOSIS — E0590 Thyrotoxicosis, unspecified without thyrotoxic crisis or storm: Secondary | ICD-10-CM | POA: Diagnosis not present

## 2020-11-06 DIAGNOSIS — I10 Essential (primary) hypertension: Secondary | ICD-10-CM | POA: Diagnosis not present

## 2020-11-10 DIAGNOSIS — E039 Hypothyroidism, unspecified: Secondary | ICD-10-CM | POA: Diagnosis not present

## 2020-11-10 DIAGNOSIS — E1165 Type 2 diabetes mellitus with hyperglycemia: Secondary | ICD-10-CM | POA: Diagnosis not present

## 2020-11-10 DIAGNOSIS — R0602 Shortness of breath: Secondary | ICD-10-CM | POA: Diagnosis not present

## 2020-11-10 DIAGNOSIS — E1159 Type 2 diabetes mellitus with other circulatory complications: Secondary | ICD-10-CM | POA: Diagnosis not present

## 2020-11-10 DIAGNOSIS — I152 Hypertension secondary to endocrine disorders: Secondary | ICD-10-CM | POA: Diagnosis not present

## 2020-11-10 DIAGNOSIS — I10 Essential (primary) hypertension: Secondary | ICD-10-CM | POA: Diagnosis not present

## 2020-11-10 DIAGNOSIS — E782 Mixed hyperlipidemia: Secondary | ICD-10-CM | POA: Diagnosis not present

## 2020-11-10 DIAGNOSIS — E1121 Type 2 diabetes mellitus with diabetic nephropathy: Secondary | ICD-10-CM | POA: Diagnosis not present

## 2020-11-10 DIAGNOSIS — E1140 Type 2 diabetes mellitus with diabetic neuropathy, unspecified: Secondary | ICD-10-CM | POA: Diagnosis not present

## 2020-11-10 DIAGNOSIS — Z794 Long term (current) use of insulin: Secondary | ICD-10-CM | POA: Diagnosis not present

## 2020-11-19 ENCOUNTER — Other Ambulatory Visit: Payer: Self-pay | Admitting: Family Medicine

## 2020-11-19 DIAGNOSIS — E2839 Other primary ovarian failure: Secondary | ICD-10-CM

## 2020-12-11 ENCOUNTER — Encounter: Payer: Self-pay | Admitting: Gastroenterology

## 2020-12-23 ENCOUNTER — Encounter: Payer: Self-pay | Admitting: Gastroenterology

## 2021-01-01 DIAGNOSIS — E559 Vitamin D deficiency, unspecified: Secondary | ICD-10-CM | POA: Diagnosis not present

## 2021-01-01 DIAGNOSIS — E039 Hypothyroidism, unspecified: Secondary | ICD-10-CM | POA: Diagnosis not present

## 2021-01-01 DIAGNOSIS — N289 Disorder of kidney and ureter, unspecified: Secondary | ICD-10-CM | POA: Diagnosis not present

## 2021-01-05 DIAGNOSIS — E559 Vitamin D deficiency, unspecified: Secondary | ICD-10-CM | POA: Diagnosis not present

## 2021-01-05 DIAGNOSIS — R7989 Other specified abnormal findings of blood chemistry: Secondary | ICD-10-CM | POA: Diagnosis not present

## 2021-01-05 DIAGNOSIS — R0602 Shortness of breath: Secondary | ICD-10-CM | POA: Diagnosis not present

## 2021-01-05 DIAGNOSIS — U071 COVID-19: Secondary | ICD-10-CM | POA: Diagnosis not present

## 2021-01-05 DIAGNOSIS — I10 Essential (primary) hypertension: Secondary | ICD-10-CM | POA: Diagnosis not present

## 2021-01-05 DIAGNOSIS — E039 Hypothyroidism, unspecified: Secondary | ICD-10-CM | POA: Diagnosis not present

## 2021-01-07 ENCOUNTER — Ambulatory Visit
Admission: RE | Admit: 2021-01-07 | Discharge: 2021-01-07 | Disposition: A | Discharge status: Home / Self Care | Payer: Medicare Other | Source: Ambulatory Visit | Attending: Family Medicine | Admitting: Family Medicine

## 2021-01-07 ENCOUNTER — Other Ambulatory Visit: Payer: Self-pay | Admitting: Family Medicine

## 2021-01-07 DIAGNOSIS — E039 Hypothyroidism, unspecified: Secondary | ICD-10-CM | POA: Diagnosis not present

## 2021-01-07 DIAGNOSIS — E1165 Type 2 diabetes mellitus with hyperglycemia: Secondary | ICD-10-CM | POA: Diagnosis not present

## 2021-01-07 DIAGNOSIS — R0602 Shortness of breath: Secondary | ICD-10-CM

## 2021-01-07 DIAGNOSIS — E11319 Type 2 diabetes mellitus with unspecified diabetic retinopathy without macular edema: Secondary | ICD-10-CM | POA: Diagnosis not present

## 2021-01-07 DIAGNOSIS — D638 Anemia in other chronic diseases classified elsewhere: Secondary | ICD-10-CM | POA: Diagnosis not present

## 2021-01-07 DIAGNOSIS — N1832 Chronic kidney disease, stage 3b: Secondary | ICD-10-CM | POA: Diagnosis not present

## 2021-01-07 DIAGNOSIS — N179 Acute kidney failure, unspecified: Secondary | ICD-10-CM | POA: Diagnosis not present

## 2021-01-07 DIAGNOSIS — I129 Hypertensive chronic kidney disease with stage 1 through stage 4 chronic kidney disease, or unspecified chronic kidney disease: Secondary | ICD-10-CM | POA: Diagnosis not present

## 2021-01-07 DIAGNOSIS — E1122 Type 2 diabetes mellitus with diabetic chronic kidney disease: Secondary | ICD-10-CM | POA: Diagnosis not present

## 2021-01-08 DIAGNOSIS — I10 Essential (primary) hypertension: Secondary | ICD-10-CM | POA: Diagnosis not present

## 2021-01-08 DIAGNOSIS — E1165 Type 2 diabetes mellitus with hyperglycemia: Secondary | ICD-10-CM | POA: Diagnosis not present

## 2021-01-08 DIAGNOSIS — R0602 Shortness of breath: Secondary | ICD-10-CM | POA: Diagnosis not present

## 2021-01-19 ENCOUNTER — Other Ambulatory Visit: Payer: Self-pay

## 2021-01-19 ENCOUNTER — Encounter: Payer: Self-pay | Admitting: Student

## 2021-01-19 ENCOUNTER — Ambulatory Visit: Payer: Medicare Other | Admitting: Student

## 2021-01-19 VITALS — BP 145/65 | HR 64 | Temp 98.0°F | Resp 16 | Ht 63.0 in | Wt 169.0 lb

## 2021-01-19 DIAGNOSIS — R0600 Dyspnea, unspecified: Secondary | ICD-10-CM | POA: Diagnosis not present

## 2021-01-19 DIAGNOSIS — I5189 Other ill-defined heart diseases: Secondary | ICD-10-CM | POA: Diagnosis not present

## 2021-01-19 DIAGNOSIS — R072 Precordial pain: Secondary | ICD-10-CM

## 2021-01-19 DIAGNOSIS — R0609 Other forms of dyspnea: Secondary | ICD-10-CM

## 2021-01-19 DIAGNOSIS — E781 Pure hyperglyceridemia: Secondary | ICD-10-CM | POA: Diagnosis not present

## 2021-01-19 DIAGNOSIS — R600 Localized edema: Secondary | ICD-10-CM

## 2021-01-19 DIAGNOSIS — R0989 Other specified symptoms and signs involving the circulatory and respiratory systems: Secondary | ICD-10-CM | POA: Diagnosis not present

## 2021-01-19 NOTE — Progress Notes (Signed)
Primary Physician/Referring:  [name], MD  Patient ID: Diana Harper, female    DOB: Jul 18, 1936, 85 y.o.   MRN: [id number]  Chief Complaint  Patient presents with  . Shortness of Breath  . Follow-up   HPI:    Diana Harper  is a 85 y.o. AA female with history of uncontrolled type 2 diabetes mellitus, hyperlipidemia, history of TIA in 2019, hypertension, and chronic kidney disease.  She was last seen in our office by Dr. Virgina Jock on 10/20/2018, she now presents back to our office by Dr. Rachell Cipro for evaluation of shortness of breath, dyspnea on exertion.  These are the same symptoms patient was originally referred to our office for in 2019.  Since last visit patient was diagnosed with COVID-19 in 10/2020, symptoms included sore throat, loss of taste and smell, and muscle soreness.  Patient has chronic dyspnea on exertion, however over the last 2 weeks she has noticed worsening dyspnea which is not associated occasionally with chest pressure.  Symptoms are worse with exertion and relieved after 2-3 minutes of rest.  She is also noticed over the last 2 weeks worsening bilateral lower leg edema.  She was recently seen by her PCP who is closely managing patient's hypertension, patient was switched to clonidine patch for management of blood pressure approximately 1-2 weeks ago per patient.  In review of PCPs recent office visit note, patient had negative chest x-ray.  Patient denies active chest pain.  Denies palpitations, dizziness, syncope, near syncope.  Denies nausea, vomiting.  Past Medical History:  Diagnosis Date  . Anemia   . Anxiety   . Arthritis   . Biliary colic   . Cholelithiasis   . CKD (chronic kidney disease), stage III [facility])    nephrologist--  dr Meredeth Ide  . Diverticulosis of colon   . History of asthma    1980's  no longer problem since 1980's  . History of diverticulitis of colon    recurrent--  2014;  2013;  2012  . Hyperlipidemia   . Hypertension    . Insulin dependent type 2 diabetes mellitus ([place])    followed by dr Jenny Reichmann lambeth ([facility])   . Murmur   . Peripheral neuropathy   . Stroke ([place])   . Vitamin D deficiency   . Wears hearing aid    bilateral  . Wears partial dentures    lower partial and upper full   Past Surgical History:  Procedure Laterality Date  . ABDOMINAL HYSTERECTOMY  1970's  . CARDIOVASCULAR STRESS TEST  07/30/2009   normal exercise lexiscan nuclear study w/ no ischemia/  normal LV funciton and wall motion, ef 77%  . CATARACT EXTRACTION W/ INTRAOCULAR LENS  IMPLANT, BILATERAL  2014  . LAPAROSCOPIC CHOLECYSTECTOMY SINGLE SITE WITH INTRAOPERATIVE CHOLANGIOGRAM N/A 08/25/2016   Procedure: LAPAROSCOPIC CHOLECYSTECTOMY WITH INTRAOPERATIVE CHOLANGIOGRAM;  Surgeon: [name], MD;  Location: [place];  Service: General;  Laterality: N/A;  . LUMBAR SPINE SURGERY  2014  . REMOVAL AXILLA CYST Right 1990's   Family History  Problem Relation Age of Onset  . Stroke Mother   . Stroke Brother     Social History   Tobacco Use  . Smoking status: Never Smoker  . Smokeless tobacco: Never Used  Substance Use Topics  . Alcohol use: No   Marital Status: Widowed   ROS  Review of Systems  Constitutional: Positive for malaise/fatigue. Negative for weight gain.  Cardiovascular: Positive for dyspnea on exertion and leg swelling. Negative  for chest pain, claudication, near-syncope, orthopnea, palpitations, paroxysmal nocturnal dyspnea and syncope.  Hematologic/Lymphatic: Does not bruise/bleed easily.  Gastrointestinal: Negative for melena.  Neurological: Negative for dizziness and weakness.    Objective  Blood pressure (!) 145/65, pulse 64, temperature 98 F (36.7 C), temperature source Temporal, resp. rate 16, height 5\' 3"  (1.6 m), weight 169 lb (76.7 kg), SpO2 96 %.  Vitals with BMI 01/19/2021 12/14/2019 12/13/2019  Height 5\' 3"  - -  Weight 169 lbs - -  BMI 26.37 - -  Systolic 858 850 277   Diastolic 65 58 70  Pulse 64 73 80     Physical Exam Vitals reviewed.  HENT:     Head: Normocephalic and atraumatic.  Neck:     Vascular: Carotid bruit present. No JVD.  Cardiovascular:     Rate and Rhythm: Normal rate and regular rhythm.     Pulses: Intact distal pulses.          Carotid pulses are 1+ on the right side and 1+ on the left side.      Radial pulses are 2+ on the right side and 2+ on the left side.       Dorsalis pedis pulses are 1+ on the right side and 1+ on the left side.       Posterior tibial pulses are 1+ on the right side and 1+ on the left side.     Heart sounds: S1 normal and S2 normal. No murmur heard. No gallop.   Pulmonary:     Effort: Pulmonary effort is normal. No respiratory distress.     Breath sounds: No wheezing, rhonchi or rales.  Musculoskeletal:     Right lower leg: Edema (trace) present.     Left lower leg: Edema (trace) present.  Skin:    General: Skin is warm and dry.  Neurological:     Mental Status: She is alert.     Laboratory examination:   No results for input(s): NA, K, CL, CO2, GLUCOSE, BUN, CREATININE, CALCIUM, GFRNONAA, GFRAA in the last 8760 hours. CrCl cannot be calculated (Patient's most recent lab result is older than the maximum 21 days allowed.).  CMP Latest Ref Rng & Units 12/14/2019 12/13/2019 12/12/2019  Glucose 70 - 99 mg/dL 134(H) 67(L) 87  BUN 8 - 23 mg/dL 32(H) 42(H) 52(H)  Creatinine 0.44 - 1.00 mg/dL 1.47(H) 1.81(H) 2.44(H)  Sodium 135 - 145 mmol/L 142 141 137  Potassium 3.5 - 5.1 mmol/L 4.1 3.7 3.6  Chloride 98 - 111 mmol/L 113(H) 115(H) 106  CO2 22 - 32 mmol/L 21(L) 18(L) 22  Calcium 8.9 - 10.3 mg/dL 8.8(L) 8.6(L) 9.3  Total Protein 6.5 - 8.1 g/dL - - 7.2  Total Bilirubin 0.3 - 1.2 mg/dL - - 0.9  Alkaline Phos 38 - 126 U/L - - 78  AST 15 - 41 U/L - - 38  ALT 0 - 44 U/L - - 39   CBC Latest Ref Rng & Units 12/13/2019 12/12/2019 10/10/2017  WBC 4.0 - 10.5 K/uL 5.7 7.7 -  Hemoglobin 12.0 - 15.0 g/dL 10.0(L)  11.9(L) 12.9  Hematocrit 36.0 - 46.0 % 31.6(L) 36.6 38.0  Platelets 150 - 400 K/uL 199 232 -    Lipid Panel No results for input(s): CHOL, TRIG, LDLCALC, VLDL, HDL, CHOLHDL, LDLDIRECT in the last 8760 hours.  HEMOGLOBIN A1C Lab Results  Component Value Date   HGBA1C 13.2 (H) 12/12/2019   MPG 332.14 12/12/2019   TSH No results for input(s): TSH in the  last 8760 hours.  External labs:  01/02/2021: Glucose 205, BUN 26, creatinine 1.74, GFR 27, sodium 143, potassium 4.5, AST 21, ALT 20 TSH 4.11 Vitamin D 16.6   11/10/2020: A1c 8.4%  05/14/2020: Glucose 117, BUN 25, creatinine 1.74, GFR 27, sodium 144, potassium 4.5 Total cholesterol 126, triglycerides 169, HDL 42, LDL 56 A1c 9.1%  01/29/2020: Hemoglobin 11.0, hematocrit 33.4, MCV 92, platelet 231   Medications and allergies   Allergies  Allergen Reactions  . Latex Anaphylaxis and Shortness Of Breath    : severe respiratory distress  . Onion Diarrhea  . Caffeine Other (See Comments)    Stomach pain from pepsi  . Lisinopril Cough  . Metformin And Related Diarrhea  . Simvastatin Rash  . Tetracycline Rash  . Zithromax [Azithromycin] Rash     Outpatient Medications Prior to Visit  Medication Sig Dispense Refill  . acetaminophen (TYLENOL) 500 MG tablet Take 1,000 mg by mouth every 6 (six) hours as needed for mild pain, moderate pain, fever or headache.    [name] amLODipine (NORVASC) 10 MG tablet Take 10 mg by mouth daily with breakfast.    . aspirin 81 MG EC tablet Take by mouth.    [name] atorvastatin (LIPITOR) 40 MG tablet Take 1 tablet (40 mg total) daily by mouth. (Patient taking differently: Take 20 mg by mouth daily.) 30 tablet 12  . Cholecalciferol 1000 units capsule Take 5,000 Units by mouth daily.     . cloNIDine (CATAPRES - DOSED IN MG/24 HR) 0.1 mg/24hr patch 0.1 mg once a week.    . gabapentin (NEURONTIN) 100 MG capsule Take 100-200 mg at bedtime as needed by mouth (nerve pain).     . hydrochlorothiazide (HYDRODIURIL)  25 MG tablet Take 25 mg by mouth every morning.    . insulin degludec (TRESIBA) 200 UNIT/ML FlexTouch Pen Inject 90 Units into the skin daily with supper.     . levothyroxine (SYNTHROID) 25 MCG tablet Take 25 mcg by mouth every morning.    . loratadine (CLARITIN) 10 MG tablet Take 10 mg daily as needed by mouth (seasonal allergies).     . losartan (COZAAR) 100 MG tablet Take 50 mg by mouth daily.    . metoprolol succinate (TOPROL-XL) 50 MG 24 hr tablet Take 25 mg by mouth in the morning and at bedtime.    . sertraline (ZOLOFT) 50 MG tablet Take 150 mg by mouth daily.     . hydrALAZINE (APRESOLINE) 25 MG tablet Take 25 mg daily by mouth.     . liraglutide (VICTOZA) 18 MG/3ML SOPN Inject 1.2 mg daily into the skin.     No facility-administered medications prior to visit.     Radiology:   No results found.  Cardiac Studies:   Echocardiogram 11/07/2018: 1. Left ventricle cavity is normal in size.  Mild concentric hypertrophy of the left ventricle.  Normal global wall motion.  Doppler evidence of grade 1 diastolic dysfunction, elevated LAP.  Calculated EF 73%. 2. Trace aortic regurgitation.  Mild aortic valve leaflet calcification. 3. Trace mitral regurgitation 4. Trace tricuspid regurgitation  Event monitor 10/20/2018-11/18/2018 (no tracings available for review): Sinus rhythm with heart rate of 60 bpm.  With no evidence of atrial fibrillation, or other significant cardiac arrhythmias.  Lexiscan nuclear stress test 11/13/2018: 1.  Lexiscan stress test was performed.  Exercise capacity was not assessed.  No stress symptoms reported.  Normal blood pressure.  The resting stress EKG demonstrated normal sinus rhythm, normal resting conduction, no resting arrhythmias and  normal rest repolarization.  Stress EKG nondiagnostic for ischemia as it is a pharmacological stress test. 2.  Overall quality of the study is excellent.  There is no evidence of abnormal lung activity.  Stress and rest SPECT  images demonstrate homogenous tracer distribution throughout the myocardium.  Gated SPECT imaging reveals normal myocardial thickness and wall motion.  The left ventricular ejection fraction was normal at 68%. 3.  Low risk study.  EKG:   EKG 01/19/2021: Sinus rhythm at a rate of 66 bpm.  Normal axis.  Poor refreshing, cannot exclude anteroseptal infarct old.  Nonspecific T wave abnormality.  No significant change compared to EKG 12/17/2023 1.  EKG 10/20/2018: Sinus rhythm at 75 bpm.  Normal axis.  Normal conduction.  Normal EKG.  Assessment     ICD-10-CM   1. Dyspnea on exertion  R06.00 EKG 12-Lead    Brain natriuretic peptide    Basic metabolic panel    PCV ECHOCARDIOGRAM COMPLETE    PCV MYOCARDIAL PERFUSION WITH LEXISCAN  2. Hypertriglyceridemia  E78.1 PCV CAROTID DUPLEX (BILATERAL)  3. Bilateral leg edema  R60.0 Brain natriuretic peptide    Basic metabolic panel    PCV ECHOCARDIOGRAM COMPLETE  4. Diastolic dysfunction  V76.16 Brain natriuretic peptide    Basic metabolic panel    PCV ECHOCARDIOGRAM COMPLETE  5. Bilateral carotid bruits  R09.89   6. Precordial pain  R07.2 PCV MYOCARDIAL PERFUSION WITH LEXISCAN     Medications Discontinued During This Encounter  Medication Reason  . hydrALAZINE (APRESOLINE) 25 MG tablet Error  . liraglutide (VICTOZA) 18 MG/3ML SOPN Error    No orders of the defined types were placed in this encounter.   Recommendations:   Diana Harper is a 85 y.o. AA female with history of uncontrolled type 2 diabetes mellitus, hyperlipidemia, history of TIA, hypertension, and chronic kidney disease.  She was last seen in our office by Dr. Virgina Jock on 10/20/2018, she now presents back to our office by Dr. Rachell Cipro for evaluation of shortness of breath, dyspnea on exertion.  These are the same symptoms patient was originally referred to our office for in 2019.  Patient presents to our office referred back by Dr. Rachell Cipro for evaluation of  shortness of breath and dyspnea on exertion, her daughter is present at bedside.  Patient symptoms are concerning for anginal equivalent.  Although patient's EKG is unchanged compared to previous and without evidence of acute ischemia or underlying injury pattern.  Counseled patient regarding signs and symptoms that would warrant emergent evaluation in the emergency department, patient and her daughter verbalized understanding agreement.  Symptoms also concerning for underlying heart failure.  Will obtain BNP and repeat BMP.  We will also obtain echocardiogram and nuclear stress test.  We will consider addition of diuretic therapy, furosemide, pending lab salts.  Advised patient to avoid strenuous physical activity pending results of nuclear stress testing, he verbalized understanding agreement.  Patient's underlying hypertension may also be contributing to symptoms.  Her PCP recently switched her to clonidine patch, patient is following closely with PCP regarding hypertension management.  I will continue to follow this well, however will not make changes today in view of recent changes by PCP.  I personally reviewed external labs, triglycerides are elevated likely secondary to uncontrolled diabetes, however otherwise lipids are well controlled.  On exam noted new carotid artery bruit bilaterally, will obtain carotid artery duplex.  S/L NTG was prescribed and explained how to and when to use it and to notify us  if there is change in frequency of use.   Follow-up in 2 weeks, sooner if needed, for anginal symptoms and hypertension.    This was a 45-minute encounter with face-to-face counseling, medical records review, coordination of care, explanation of complex medical issues, complex medical decision making.   During this visit I reviewed and updated: Tobacco history  allergies medication reconciliation  medical history  surgical history  family history  social history.  This note was created  using a voice recognition software as a result there may be grammatical errors inadvertently enclosed that do not reflect the nature of this encounter. Every attempt is made to correct such errors.   Diana Berthold, PA-C 01/19/2021, 4:53 PM Office: [phone]

## 2021-01-20 LAB — BASIC METABOLIC PANEL
BUN/Creatinine Ratio: 16 (ref 12–28)
BUN: 26 mg/dL (ref 8–27)
CO2: 22 mmol/L (ref 20–29)
Calcium: 9.9 mg/dL (ref 8.7–10.3)
Chloride: 104 mmol/L (ref 96–106)
Creatinine, Ser: 1.66 mg/dL — ABNORMAL HIGH (ref 0.57–1.00)
GFR calc Af Amer: 33 mL/min/{1.73_m2} — ABNORMAL LOW (ref >59)
GFR calc non Af Amer: 28 mL/min/{1.73_m2} — ABNORMAL LOW (ref >59)
Glucose: 145 mg/dL — ABNORMAL HIGH (ref 65–99)
Potassium: 4.6 mmol/L (ref 3.5–5.2)
Sodium: 145 mmol/L — ABNORMAL HIGH (ref 134–144)

## 2021-01-20 LAB — BRAIN NATRIURETIC PEPTIDE: BNP: 50.2 pg/mL (ref 0.0–100.0)

## 2021-01-20 MED ORDER — NITROGLYCERIN 0.4 MG SL SUBL: 0.4000 mg | SUBLINGUAL_TABLET | SUBLINGUAL | 3 refills | Status: AC | PRN

## 2021-01-20 NOTE — Progress Notes (Signed)
Please notify patient her BNP is normal, does not suggest heart failure/volume overload. Kidney function is stable.

## 2021-01-21 ENCOUNTER — Other Ambulatory Visit: Payer: Self-pay

## 2021-01-21 ENCOUNTER — Ambulatory Visit: Payer: Medicare Other

## 2021-01-21 DIAGNOSIS — R0609 Other forms of dyspnea: Secondary | ICD-10-CM | POA: Diagnosis not present

## 2021-01-21 DIAGNOSIS — R072 Precordial pain: Secondary | ICD-10-CM | POA: Diagnosis not present

## 2021-01-21 DIAGNOSIS — R0600 Dyspnea, unspecified: Secondary | ICD-10-CM

## 2021-01-21 NOTE — Progress Notes (Signed)
Called and spoke with pt regarding lab results. Pt voiced understanding.

## 2021-01-21 NOTE — Progress Notes (Signed)
Attempted to call pt. No answer, left vm requesting call back.

## 2021-01-22 DIAGNOSIS — I10 Essential (primary) hypertension: Secondary | ICD-10-CM | POA: Diagnosis not present

## 2021-01-22 DIAGNOSIS — R0602 Shortness of breath: Secondary | ICD-10-CM | POA: Diagnosis not present

## 2021-01-22 DIAGNOSIS — M7989 Other specified soft tissue disorders: Secondary | ICD-10-CM | POA: Diagnosis not present

## 2021-01-26 ENCOUNTER — Telehealth: Payer: Self-pay

## 2021-01-26 NOTE — Progress Notes (Signed)
Please inform patient low risk stress test. Normal blood flow to heart muscle.

## 2021-01-26 NOTE — Telephone Encounter (Signed)
-----   Message from Alethia Berthold, [place] sent at 01/26/2021  1:38 PM EST ----- Please inform patient low risk stress test. Normal blood flow to heart muscle.

## 2021-01-26 NOTE — Progress Notes (Signed)
Patient is aware of results.

## 2021-02-03 ENCOUNTER — Ambulatory Visit: Payer: Medicare Other

## 2021-02-03 ENCOUNTER — Other Ambulatory Visit: Payer: Self-pay

## 2021-02-03 DIAGNOSIS — R600 Localized edema: Secondary | ICD-10-CM | POA: Diagnosis not present

## 2021-02-03 DIAGNOSIS — R0609 Other forms of dyspnea: Secondary | ICD-10-CM | POA: Diagnosis not present

## 2021-02-03 DIAGNOSIS — I5189 Other ill-defined heart diseases: Secondary | ICD-10-CM

## 2021-02-03 DIAGNOSIS — E781 Pure hyperglyceridemia: Secondary | ICD-10-CM

## 2021-02-03 DIAGNOSIS — R0989 Other specified symptoms and signs involving the circulatory and respiratory systems: Secondary | ICD-10-CM | POA: Diagnosis not present

## 2021-02-03 DIAGNOSIS — R0600 Dyspnea, unspecified: Secondary | ICD-10-CM

## 2021-02-04 ENCOUNTER — Other Ambulatory Visit (INDEPENDENT_AMBULATORY_CARE_PROVIDER_SITE_OTHER): Payer: Medicare Other

## 2021-02-04 ENCOUNTER — Ambulatory Visit (INDEPENDENT_AMBULATORY_CARE_PROVIDER_SITE_OTHER): Payer: Medicare Other | Admitting: Gastroenterology

## 2021-02-04 ENCOUNTER — Encounter: Payer: Self-pay | Admitting: Gastroenterology

## 2021-02-04 VITALS — BP 140/62 | HR 80 | Ht 63.0 in | Wt 165.5 lb

## 2021-02-04 DIAGNOSIS — R159 Full incontinence of feces: Secondary | ICD-10-CM

## 2021-02-04 DIAGNOSIS — Z85038 Personal history of other malignant neoplasm of large intestine: Secondary | ICD-10-CM

## 2021-02-04 DIAGNOSIS — K649 Unspecified hemorrhoids: Secondary | ICD-10-CM

## 2021-02-04 DIAGNOSIS — R630 Anorexia: Secondary | ICD-10-CM | POA: Diagnosis not present

## 2021-02-04 DIAGNOSIS — I10 Essential (primary) hypertension: Secondary | ICD-10-CM

## 2021-02-04 DIAGNOSIS — R151 Fecal smearing: Secondary | ICD-10-CM

## 2021-02-04 DIAGNOSIS — R140 Abdominal distension (gaseous): Secondary | ICD-10-CM | POA: Diagnosis not present

## 2021-02-04 DIAGNOSIS — K625 Hemorrhage of anus and rectum: Secondary | ICD-10-CM

## 2021-02-04 DIAGNOSIS — J45909 Unspecified asthma, uncomplicated: Secondary | ICD-10-CM | POA: Diagnosis not present

## 2021-02-04 DIAGNOSIS — R0600 Dyspnea, unspecified: Secondary | ICD-10-CM | POA: Diagnosis not present

## 2021-02-04 DIAGNOSIS — Z86010 Personal history of colonic polyps: Secondary | ICD-10-CM

## 2021-02-04 LAB — BASIC METABOLIC PANEL
BUN: 33 mg/dL — ABNORMAL HIGH (ref 6–23)
CO2: 28 mEq/L (ref 19–32)
Calcium: 9.8 mg/dL (ref 8.4–10.5)
Chloride: 104 mEq/L (ref 96–112)
Creatinine, Ser: 2 mg/dL — ABNORMAL HIGH (ref 0.40–1.20)
GFR: 22.89 mL/min — ABNORMAL LOW (ref >60.00)
Glucose, Bld: 178 mg/dL — ABNORMAL HIGH (ref 70–99)
Potassium: 4.1 mEq/L (ref 3.5–5.1)
Sodium: 140 mEq/L (ref 135–145)

## 2021-02-04 MED ORDER — PLENVU 140 G PO SOLR: 1.0000 | ORAL | 0 refills | Status: DC

## 2021-02-04 MED ORDER — HYOSCYAMINE SULFATE 0.125 MG SL SUBL: 0.1250 mg | SUBLINGUAL_TABLET | SUBLINGUAL | 1 refills | Status: DC | PRN

## 2021-02-04 NOTE — Patient Instructions (Signed)
If you are age 83 or older, your body mass index should be between 23-30. Your Body mass index is 29.32 kg/m. If this is out of the aforementioned range listed, please consider follow up with your Primary Care Provider.  You have been scheduled for an endoscopy and colonoscopy. Please follow the written instructions given to you at your visit today. Please pick up your prep supplies at the pharmacy within the next 1-3 days. If you use inhalers (even only as needed), please bring them with you on the day of your procedure.   We have sent the following medications to your pharmacy for you to pick up at your convenience: Levsin, Plenvu   A high fiber diet with plenty of fluids (up to 8 glasses of water daily) is suggested to relieve these symptoms.  Metamucil, 1 tablespoon once or twice daily can be used to keep bowels regular if needed.  Your provider has requested that you go to the basement level for lab work before leaving today. Press "B" on the elevator. The lab is located at the first door on the left as you exit the elevator.  Due to recent changes in healthcare laws, you may see the results of your imaging and laboratory studies on MyChart before your provider has had a chance to review them.  We understand that in some cases there may be results that are confusing or concerning to you. Not all laboratory results come back in the same time frame and the provider may be waiting for multiple results in order to interpret others.  Please give [name] 48 hours in order for your provider to thoroughly review all the results before contacting the office for clarification of your results.   Thank you for choosing me and [facility].  Dr. Rush Landmark

## 2021-02-05 LAB — CBC
HCT: 35.7 % — ABNORMAL LOW (ref 36.0–46.0)
Hemoglobin: 12 g/dL (ref 12.0–15.0)
MCHC: 33.7 g/dL (ref 30.0–36.0)
MCV: 87 fl (ref 78.0–100.0)
Platelets: 265 10*3/uL (ref 150.0–400.0)
RBC: 4.1 Mil/uL (ref 3.87–5.11)
RDW: 15.5 % (ref 11.5–15.5)
WBC: 6.8 10*3/uL (ref 4.0–10.5)

## 2021-02-05 LAB — TSH: TSH: 2.67 u[IU]/mL (ref 0.35–4.50)

## 2021-02-05 LAB — TISSUE TRANSGLUTAMINASE, IGA: (tTG) Ab, IgA: <1 U/mL

## 2021-02-05 LAB — IBC + FERRITIN
Ferritin: 70.8 ng/mL (ref 10.0–291.0)
Iron: 53 ug/dL (ref 42–145)
Saturation Ratios: 16 % — ABNORMAL LOW (ref 20.0–50.0)
Transferrin: 237 mg/dL (ref 212.0–360.0)

## 2021-02-05 LAB — IGA: Immunoglobulin A: 289 mg/dL (ref 70–320)

## 2021-02-05 NOTE — Progress Notes (Signed)
Will discuss at office visit tomorrow

## 2021-02-05 NOTE — Progress Notes (Signed)
Primary Physician/Referring:  [name], MD  Patient ID: Diana Harper, female    DOB: [birth year], 85 y.o.   MRN: [id number]  Chief Complaint  Patient presents with   Dyspnea on exertion   Follow-up   HPI:    Diana Harper  is a 85 y.o. AA female with history of uncontrolled type 2 diabetes mellitus, hyperlipidemia, history of TIA in 2019, hypertension, and chronic kidney disease.  She was last seen in our office by Dr. Virgina Jock on 10/20/2018, she now presents back to our office by Dr. Rachell Cipro for evaluation of shortness of breath, dyspnea on exertion.  These are the same symptoms patient was originally referred to our office for in 2019.  History of COVID-19 infection in 10/2020.  Patient presents for 2-week follow-up of precordial pain and dyspnea on exertion, as well as results of cardiac testing.  Since last office visit patient has undergone carotid artery duplex revealing only minimal plaque, and low risk nuclear stress test.  Results of echocardiogram are currently pending.  Patient states she continues to experience dyspnea on exertion and fatigue which are stable.  She also continues to have chest discomfort which she describes as pressure with exertion, particularly when she is very short of breath.  She states her dyspnea and chest discomfort improve after 3 to 4 minutes of rest.  As cardiac work-up thus far has been unyielding, patient's PCP prescribed albuterol inhaler, as she was suspicious of underlying pulmonary etiology for patient's symptoms.  Patient's PCP has also increased her clonidine earlier this week in order to improve blood pressure control.  Patient denies palpitations, dizziness, syncope, near syncope.  Denies nausea, vomiting.  Past Medical History:  Diagnosis Date   Anemia    Anxiety    Arthritis    Asthma    Biliary colic    Cholelithiasis    CKD (chronic kidney disease), stage III [facility])    nephrologist--  dr Meredeth Ide    Colon polyps    Diverticulosis of colon    Gastric ulcer    Glaucoma    History of asthma    1980's  no longer problem since 1980's   History of diverticulitis of colon    recurrent--  2014;  2013;  2012   Hyperlipidemia    Hypertension    Hypothyroidism    Insulin dependent type 2 diabetes mellitus ([place])    followed by dr Jenny Reichmann lambeth ([facility])    Murmur    Peripheral neuropathy    Stroke [facility])    Vitamin D deficiency    Wears hearing aid    bilateral   Wears partial dentures    lower partial and upper full   Past Surgical History:  Procedure Laterality Date   ABDOMINAL HYSTERECTOMY  1970's   CARDIOVASCULAR STRESS TEST  07/30/2009   normal exercise lexiscan nuclear study w/ no ischemia/  normal LV funciton and wall motion, ef 77%   CATARACT EXTRACTION W/ INTRAOCULAR LENS  IMPLANT, BILATERAL  2014   LAPAROSCOPIC CHOLECYSTECTOMY SINGLE SITE WITH INTRAOPERATIVE CHOLANGIOGRAM N/A 08/25/2016   Procedure: LAPAROSCOPIC CHOLECYSTECTOMY WITH INTRAOPERATIVE CHOLANGIOGRAM;  Surgeon: [name], MD;  Location: [place];  Service: General;  Laterality: N/A;   LUMBAR SPINE SURGERY  2014   REMOVAL AXILLA CYST Right 67's   Family History  Problem Relation Age of Onset   Stroke Mother    Colon polyps Mother    Diabetes Mother    Diabetes Father    Kidney failure  Father    COPD Father    Colon polyps Sister    Diabetes Sister    Stroke Brother    Diabetes Brother    Heart attack Maternal Grandmother    Heart disease Maternal Grandfather    Diabetes Paternal Grandmother    Stroke Paternal Grandfather    Hypertension Paternal Grandfather    Diabetes Sister    Diabetes Sister    Diabetes Son    Diabetes Daughter     Social History   Tobacco Use   Smoking status: Never Smoker   Smokeless tobacco: Never Used  Substance Use Topics   Alcohol use: No   Marital Status: Widowed   ROS  Review of Systems   Constitutional: Positive for malaise/fatigue. Negative for weight gain.  Cardiovascular: Positive for dyspnea on exertion and leg swelling. Negative for chest pain, claudication, near-syncope, orthopnea, palpitations, paroxysmal nocturnal dyspnea and syncope.  Hematologic/Lymphatic: Does not bruise/bleed easily.  Gastrointestinal: Negative for melena.  Neurological: Negative for dizziness and weakness.    Objective  Blood pressure 136/68, pulse 79, temperature 97.8 F (36.6 C), temperature source Temporal, resp. rate 16, height _0  (1.6 m), weight 163 lb 6.4 oz (74.1 kg), SpO2 94 %.  Vitals with BMI 02/06/2021 02/04/2021 01/19/2021  Height _1  _2  _3   Weight 163 lbs 6 oz 165 lbs 8 oz 169 lbs  BMI 28.95 68.37 29.02  Systolic 111 552 080  Diastolic 68 62 65  Pulse 79 80 64     Physical Exam Vitals reviewed.  HENT:     Head: Normocephalic and atraumatic.  Neck:     Vascular: Carotid bruit present. No JVD.  Cardiovascular:     Rate and Rhythm: Normal rate and regular rhythm.     Pulses: Intact distal pulses.          Carotid pulses are 1+ on the right side and 1+ on the left side.      Radial pulses are 2+ on the right side and 2+ on the left side.       Dorsalis pedis pulses are 1+ on the right side and 1+ on the left side.       Posterior tibial pulses are 1+ on the right side and 1+ on the left side.     Heart sounds: S1 normal and S2 normal. No murmur heard. No gallop.   Pulmonary:     Effort: Pulmonary effort is normal. No respiratory distress.     Breath sounds: No wheezing, rhonchi or rales.  Musculoskeletal:     Right lower leg: Edema (trace) present.     Left lower leg: Edema (trace) present.  Skin:    General: Skin is warm and dry.     Capillary Refill: Capillary refill takes less than 2 seconds.  Neurological:     General: No focal deficit present.     Mental Status: She is alert and oriented to person, place, and time.     Laboratory examination:    Recent Labs    01/19/21 1504 02/04/21 1700  NA 145* 140  K 4.6 4.1  CL 104 104  CO2 22 28  GLUCOSE 145* 178*  BUN 26 33*  CREATININE 1.66* 2.00*  CALCIUM 9.9 9.8  GFRNONAA 28*  --   GFRAA 33*  --    estimated creatinine clearance is 20.9 mL/min (A) (by C-G formula based on SCr of 2 mg/dL (H)).  CMP Latest Ref Rng & Units 02/04/2021 01/19/2021 12/14/2019  Glucose  70 - 99 mg/dL 178(H) 145(H) 134(H)  BUN 6 - 23 mg/dL 33(H) 26 32(H)  Creatinine 0.40 - 1.20 mg/dL 2.00(H) 1.66(H) 1.47(H)  Sodium 135 - 145 mEq/L 140 145(H) 142  Potassium 3.5 - 5.1 mEq/L 4.1 4.6 4.1  Chloride 96 - 112 mEq/L 104 104 113(H)  CO2 19 - 32 mEq/L 28 22 21(L)  Calcium 8.4 - 10.5 mg/dL 9.8 9.9 8.8(L)  Total Protein 6.5 - 8.1 g/dL - - -  Total Bilirubin 0.3 - 1.2 mg/dL - - -  Alkaline Phos 38 - 126 U/L - - -  AST 15 - 41 U/L - - -  ALT 0 - 44 U/L - - -   CBC Latest Ref Rng & Units 02/04/2021 12/13/2019 12/12/2019  WBC 4.0 - 10.5 K/uL 6.8 5.7 7.7  Hemoglobin 12.0 - 15.0 g/dL 12.0 10.0(L) 11.9(L)  Hematocrit 36.0 - 46.0 % 35.7(L) 31.6(L) 36.6  Platelets 150.0 - 400.0 K/uL 265.0 199 232    Lipid Panel No results for input(s): CHOL, TRIG, LDLCALC, VLDL, HDL, CHOLHDL, LDLDIRECT in the last 8760 hours.  HEMOGLOBIN A1C Lab Results  Component Value Date   HGBA1C 13.2 (H) 12/12/2019   MPG 332.14 12/12/2019   TSH Recent Labs    02/04/21 1700  TSH 2.67    External labs:  01/02/2021: Glucose 205, BUN 26, creatinine 1.74, GFR 27, sodium 143, potassium 4.5, AST 21, ALT 20 TSH 4.11 Vitamin D 16.6   11/10/2020: A1c 8.4%  05/14/2020: Glucose 117, BUN 25, creatinine 1.74, GFR 27, sodium 144, potassium 4.5 Total cholesterol 126, triglycerides 169, HDL 42, LDL 56 A1c 9.1%  01/29/2020: Hemoglobin 11.0, hematocrit 33.4, MCV 92, platelet 231   Medications and allergies   Allergies  Allergen Reactions   Latex Anaphylaxis and Shortness Of Breath    : severe respiratory distress   Onion Diarrhea    Caffeine Other (See Comments)    Stomach pain from pepsi   Lisinopril Cough   Metformin And Related Diarrhea   Simvastatin Rash   Tetracycline Rash   Zithromax [Azithromycin] Rash     Outpatient Medications Prior to Visit  Medication Sig Dispense Refill   acetaminophen (TYLENOL) 500 MG tablet Take 1,000 mg by mouth every 6 (six) hours as needed for mild pain, moderate pain, fever or headache.     albuterol (VENTOLIN HFA) 108 (90 Base) MCG/ACT inhaler Inhale 1-2 puffs into the lungs as directed.     amLODipine (NORVASC) 10 MG tablet Take 10 mg by mouth daily with breakfast.     aspirin 81 MG EC tablet Take by mouth.     atorvastatin (LIPITOR) 40 MG tablet Take 1 tablet (40 mg total) daily by mouth. (Patient taking differently: Take 20 mg by mouth daily.) 30 tablet 12   Calcium Carbonate (CALTRATE 600 PO) Take 1 tablet by mouth daily.     Cholecalciferol 1000 units capsule Take 5,000 Units by mouth daily.      cloNIDine (CATAPRES - DOSED IN MG/24 HR) 0.1 mg/24hr patch 0.1 mg once a week.     cyclobenzaprine (FLEXERIL) 10 MG tablet Take 10 mg by mouth 3 (three) times daily as needed for muscle spasms.     gabapentin (NEURONTIN) 100 MG capsule Take 100-200 mg at bedtime as needed by mouth (nerve pain).      Glucagon HCl 1 MG SOLR Inject as directed as needed.     hydrochlorothiazide (HYDRODIURIL) 25 MG tablet Take 25 mg by mouth every morning.     hyoscyamine (LEVSIN  SL) 0.125 MG SL tablet Place 1 tablet (0.125 mg total) under the tongue every 4 (four) hours as needed. When having large meals 30 tablet 1   Insulin Aspart (NOVOLOG FLEXPEN [place]) Inject 20 Units into the skin daily after supper.     insulin degludec (TRESIBA) 200 UNIT/ML FlexTouch Pen Inject 90 Units into the skin daily with supper.      levothyroxine (SYNTHROID) 25 MCG tablet Take 25 mcg by mouth every morning.     loratadine (CLARITIN) 10 MG tablet Take 10 mg daily as needed by mouth (seasonal allergies).       losartan (COZAAR) 100 MG tablet Take 25 mg by mouth daily.     nitroGLYCERIN (NITROSTAT) 0.4 MG SL tablet Place 1 tablet (0.4 mg total) under the tongue every 5 (five) minutes as needed for chest pain. 30 tablet 3   PEG-KCl-NaCl-NaSulf-Na Asc-C (PLENVU) 140 g SOLR Take 1 kit by mouth as directed. Use coupon: BIN: 409811 PNC: CNRX Group: [id number] ID: [id number] 1 each 0   sertraline (ZOLOFT) 50 MG tablet Take 150 mg by mouth daily.      traMADol (ULTRAM) 50 MG tablet Take 50 mg by mouth as needed.     omeprazole (PRILOSEC) 40 MG capsule Take 40-80 mg by mouth daily as needed.     No facility-administered medications prior to visit.     Radiology:   No results found.  Cardiac Studies:  Echocardiogram: pending   Carotid artery duplex 02/03/2021:  Minimal plaque noted in the bilateral common carotid arteries. No  hemodynamically significant stenosis noted in bilateral ICA.  Antegrade right vertebral artery flow. Antegrade left vertebral artery  flow.  PCV MYOCARDIAL PERFUSION WITH LEXISCAN 01/21/2021 Lexiscan nuclear stress test performed using 1-day protocol. Normal myocardial perfusion. Stress LVEF 56%. Low risk study.  Echocardiogram 11/07/2018: 1. Left ventricle cavity is normal in size.  Mild concentric hypertrophy of the left ventricle.  Normal global wall motion.  Doppler evidence of grade 1 diastolic dysfunction, elevated LAP.  Calculated EF 73%. 2. Trace aortic regurgitation.  Mild aortic valve leaflet calcification. 3. Trace mitral regurgitation 4. Trace tricuspid regurgitation  Event monitor 10/20/2018-11/18/2018 (no tracings available for review): Sinus rhythm with heart rate of 60 bpm.  With no evidence of atrial fibrillation, or other significant cardiac arrhythmias.  Lexiscan nuclear stress test 11/13/2018: 1.  Lexiscan stress test was performed.  Exercise capacity was not assessed.  No stress symptoms reported.  Normal blood pressure.  The resting stress  EKG demonstrated normal sinus rhythm, normal resting conduction, no resting arrhythmias and normal rest repolarization.  Stress EKG nondiagnostic for ischemia as it is a pharmacological stress test. 2.  Overall quality of the study is excellent.  There is no evidence of abnormal lung activity.  Stress and rest SPECT images demonstrate homogenous tracer distribution throughout the myocardium.  Gated SPECT imaging reveals normal myocardial thickness and wall motion.  The left ventricular ejection fraction was normal at 68%. 3.  Low risk study.  EKG:   EKG 01/19/2021: Sinus rhythm at a rate of 66 bpm.  Normal axis.  Poor refreshing, cannot exclude anteroseptal infarct old.  Nonspecific T wave abnormality.  No significant change compared to EKG 12/17/2023 1.  EKG 10/20/2018: Sinus rhythm at 75 bpm.  Normal axis.  Normal conduction.  Normal EKG.  Assessment     ICD-10-CM   1. Dyspnea on exertion  R06.00   2. Precordial pain  R07.2   3. Bilateral leg edema  R60.0  Medications Discontinued During This Encounter  Medication Reason   omeprazole (PRILOSEC) 40 MG capsule Error    No orders of the defined types were placed in this encounter.   Recommendations:   Toccara Alford is a 85 y.o. AA female with history of uncontrolled type 2 diabetes mellitus, hyperlipidemia, history of TIA, hypertension, and chronic kidney disease.  She was last seen in our office by Dr. Virgina Jock on 10/20/2018, she now presents back to our office by Dr. Rachell Cipro for evaluation of shortness of breath, dyspnea on exertion.  These are the same symptoms patient was originally referred to our office for in 2019.  Patient with history of COVID-19 infection in 10/2020.  Patient presents for 2-week follow-up of precordial pain and dyspnea on exertion, as well as results of cardiac testing.  Since last office visit patient has undergone carotid artery duplex revealing only minimal plaque, and low risk nuclear stress  test.  Results of echocardiogram are currently pending, therefore advised patient I will have to call her with the results of this testing.  Reviewed and discussed with patient regarding results of carotid artery duplex, cardiac stress testing, and lab work.  Patient's BNP was normal, I have a low suspicion symptoms are related to underlying heart failure, however echocardiogram is pending.  Also advised patient stress test is reassuring that her symptoms are not related to significant underlying coronary artery disease.  If echocardiogram is without significant abnormality to explain etiology of patient's symptoms, recommend considering pulmonary evaluation of dyspnea.  I do suspect patient's dyspnea on exertion to be multifactorial including deconditioning as well as potential long-term effects of COVID-19 infection.  Patient is in the process of being evaluated by GI for chest pressure symptoms and fecal incontinence, GI provider has recommended both upper endoscopy and colonoscopy.  Patient is likely low risk from a cardiovascular standpoint if her echocardiogram is without significant abnormality, however would recommend waiting for echocardiogram results before proceeding with endoscopic procedures.  In regard to hypertension, blood pressure is well controlled in the office today.  Patient's PCP has again made recent changes to patient's antihypertensive medications, therefore I will not make changes at this time.  Unless there significant abnormalities on echocardiogram, recommend follow-up in 3 months, sooner if needed, for dyspnea on exertion, hyperlipidemia, hypertension.   Alethia Berthold, PA-C 02/06/2021, 4:19 PM Office: [phone]

## 2021-02-06 ENCOUNTER — Ambulatory Visit: Payer: Medicare Other | Admitting: Student

## 2021-02-06 ENCOUNTER — Other Ambulatory Visit: Payer: Self-pay

## 2021-02-06 ENCOUNTER — Encounter: Payer: Self-pay | Admitting: Student

## 2021-02-06 VITALS — BP 136/68 | HR 79 | Temp 97.8°F | Resp 16 | Ht 63.0 in | Wt 163.4 lb

## 2021-02-06 DIAGNOSIS — R600 Localized edema: Secondary | ICD-10-CM

## 2021-02-06 DIAGNOSIS — R0609 Other forms of dyspnea: Secondary | ICD-10-CM

## 2021-02-06 DIAGNOSIS — R072 Precordial pain: Secondary | ICD-10-CM | POA: Diagnosis not present

## 2021-02-06 DIAGNOSIS — R0600 Dyspnea, unspecified: Secondary | ICD-10-CM

## 2021-02-09 ENCOUNTER — Encounter: Payer: Self-pay | Admitting: Gastroenterology

## 2021-02-09 DIAGNOSIS — E1165 Type 2 diabetes mellitus with hyperglycemia: Secondary | ICD-10-CM | POA: Diagnosis not present

## 2021-02-09 DIAGNOSIS — E1121 Type 2 diabetes mellitus with diabetic nephropathy: Secondary | ICD-10-CM | POA: Diagnosis not present

## 2021-02-09 DIAGNOSIS — K649 Unspecified hemorrhoids: Secondary | ICD-10-CM | POA: Insufficient documentation

## 2021-02-09 DIAGNOSIS — R151 Fecal smearing: Secondary | ICD-10-CM | POA: Insufficient documentation

## 2021-02-09 DIAGNOSIS — K625 Hemorrhage of anus and rectum: Secondary | ICD-10-CM | POA: Insufficient documentation

## 2021-02-09 DIAGNOSIS — Z86010 Personal history of colonic polyps: Secondary | ICD-10-CM | POA: Insufficient documentation

## 2021-02-09 DIAGNOSIS — Z794 Long term (current) use of insulin: Secondary | ICD-10-CM | POA: Diagnosis not present

## 2021-02-09 NOTE — Progress Notes (Signed)
[facility] VISIT   Primary Care Provider [name], [address] [place] [address] [phone]  Referring Provider [name], [address] [place],  [place] [address] [phone]  Patient Profile: Diana Harper is a 85 y.o. female with a pmh significant for hypertension, hyperlipidemia, hypothyroidism, diabetes, chronic renal insufficiency, prior stroke, status post cholecystectomy, prior colon polyps, diverticulosis (with history of diverticulitis).  The patient presents to the [facility] for an evaluation and management of problem(s) noted below:  Problem List 1. Rectal bleeding   2. Hemorrhoids, unspecified hemorrhoid type   3. Fecal smearing   4. History of colon polyps   5. Anorexia   6. Bloating symptom     History of Present Illness This is the patient's first visit to the outpatient [facility] in years.  The patient has been experiencing multiple issues over the course of the last few years.  She describes issues of recurrent abdominal pain.  She has experienced episodes of bloating and abdominal distention.  Every few months she will have anorexia.  Weight thankfully has been stable to increasing slightly rather than losing.  She has noted changes in her bowel habits where she will have alternating diarrhea and constipation.  However, most often the patient is experiencing abdominal discomfort that occurs within 15 minutes of eating leading to her needing to use the bathroom quickly as a result of a quick gastrocolic reflex.  This occurs 2-3 times per day.  She has experienced bright red blood per rectum in the past but is attributed that to hemorrhoids.  When she has her bowel movement the lower abdominal discomfort improves.  She has experienced episodes of fecal smearing.  She has never been on Bentyl or Levsin.  She is not sure if she wants to undergo any further endoscopic  evaluation.  GI Review of Systems Positive as above Negative for dysphagia, odynophagia, nausea, vomiting, melena  Review of Systems General: Denies fevers/chills/weight loss unintentionally Cardiovascular: Denies chest pain/palpitations Pulmonary: Denies shortness of breath Gastroenterological: See HPI Genitourinary: Denies darkened urine or hematuria Hematological: Denies easy bruising/bleeding Endocrine: Denies temperature intolerance Dermatological: Denies jaundice Psychological: Mood is stable   Medications Current Outpatient Medications  Medication Sig Dispense Refill  . acetaminophen (TYLENOL) 500 MG tablet Take 1,000 mg by mouth every 6 (six) hours as needed for mild pain, moderate pain, fever or headache.    . albuterol (VENTOLIN HFA) 108 (90 Base) MCG/ACT inhaler Inhale 1-2 puffs into the lungs as directed.    [name] amLODipine (NORVASC) 10 MG tablet Take 10 mg by mouth daily with breakfast.    . aspirin 81 MG EC tablet Take by mouth.    [name] atorvastatin (LIPITOR) 40 MG tablet Take 1 tablet (40 mg total) daily by mouth. (Patient taking differently: Take 20 mg by mouth daily.) 30 tablet 12  . Calcium Carbonate (CALTRATE 600 PO) Take 1 tablet by mouth daily.    . Cholecalciferol 1000 units capsule Take 5,000 Units by mouth daily.     . cloNIDine (CATAPRES - DOSED IN MG/24 HR) 0.1 mg/24hr patch 0.1 mg once a week.    . cyclobenzaprine (FLEXERIL) 10 MG tablet Take 10 mg by mouth 3 (three) times daily as needed for muscle spasms.    [name] gabapentin (NEURONTIN) 100 MG capsule Take 100-200 mg at bedtime as needed by mouth (nerve pain).     . Glucagon HCl 1 MG SOLR Inject as  directed as needed.    . hydrochlorothiazide (HYDRODIURIL) 25 MG tablet Take 25 mg by mouth every morning.    . hyoscyamine (LEVSIN SL) 0.125 MG SL tablet Place 1 tablet (0.125 mg total) under the tongue every 4 (four) hours as needed. When having large meals 30 tablet 1  . Insulin Aspart (NOVOLOG FLEXPEN [place]) Inject 20  Units into the skin daily after supper.    . insulin degludec (TRESIBA) 200 UNIT/ML FlexTouch Pen Inject 90 Units into the skin daily with supper.     . levothyroxine (SYNTHROID) 25 MCG tablet Take 25 mcg by mouth every morning.    . loratadine (CLARITIN) 10 MG tablet Take 10 mg daily as needed by mouth (seasonal allergies).     . losartan (COZAAR) 100 MG tablet Take 25 mg by mouth daily.    . nitroGLYCERIN (NITROSTAT) 0.4 MG SL tablet Place 1 tablet (0.4 mg total) under the tongue every 5 (five) minutes as needed for chest pain. 30 tablet 3  . PEG-KCl-NaCl-NaSulf-Na Asc-C (PLENVU) 140 g SOLR Take 1 kit by mouth as directed. Use coupon: BIN: [id number] PNC: CNRX Group: [id number] ID: [id number] 1 each 0  . sertraline (ZOLOFT) 50 MG tablet Take 150 mg by mouth daily.     . traMADol (ULTRAM) 50 MG tablet Take 50 mg by mouth as needed.     No current facility-administered medications for this visit.    Allergies Allergies  Allergen Reactions  . Latex Anaphylaxis and Shortness Of Breath    : severe respiratory distress  . Onion Diarrhea  . Caffeine Other (See Comments)    Stomach pain from pepsi  . Lisinopril Cough  . Metformin And Related Diarrhea  . Simvastatin Rash  . Tetracycline Rash  . Zithromax [Azithromycin] Rash    Histories Past Medical History:  Diagnosis Date  . Anemia   . Anxiety   . Arthritis   . Asthma   . Biliary colic   . Cholelithiasis   . CKD (chronic kidney disease), stage III [facility])    nephrologist--  dr Meredeth Ide  . Colon polyps   . Diverticulosis of colon   . Gastric ulcer   . Glaucoma   . History of asthma    1980's  no longer problem since 1980's  . History of diverticulitis of colon    recurrent--  2014;  2013;  2012  . Hyperlipidemia   . Hypertension   . Hypothyroidism   . Insulin dependent type 2 diabetes mellitus ([facility])    followed by dr Jenny Reichmann [facility] ([facility])   . Murmur   . Peripheral neuropathy   . Stroke ([facility])   . Vitamin D deficiency   .  Wears hearing aid    bilateral  . Wears partial dentures    lower partial and upper full   Past Surgical History:  Procedure Laterality Date  . ABDOMINAL HYSTERECTOMY  1970's  . CARDIOVASCULAR STRESS TEST  07/30/2009   normal exercise lexiscan nuclear study w/ no ischemia/  normal LV funciton and wall motion, ef 77%  . CATARACT EXTRACTION W/ INTRAOCULAR LENS  IMPLANT, BILATERAL  2014  . LAPAROSCOPIC CHOLECYSTECTOMY SINGLE SITE WITH INTRAOPERATIVE CHOLANGIOGRAM N/A 08/25/2016   Procedure: LAPAROSCOPIC CHOLECYSTECTOMY WITH INTRAOPERATIVE CHOLANGIOGRAM;  Surgeon: [name], MD;  Location: [facility];  Service: General;  Laterality: N/A;  . LUMBAR SPINE SURGERY  2014  . REMOVAL AXILLA CYST Right 1990's   Social History   Socioeconomic History  . Marital status: Widowed  Spouse name: Not on file  . Number of children: 4  . Years of education: Not on file  . Highest education level: Not on file  Occupational History  . Occupation: retired  Tobacco Use  . Smoking status: Never Smoker  . Smokeless tobacco: Never Used  Vaping Use  . Vaping Use: Never used  Substance and Sexual Activity  . Alcohol use: No  . Drug use: No  . Sexual activity: Not on file  Other Topics Concern  . Not on file  Social History Narrative  . Not on file   Social Determinants of Health   Financial Resource Strain: Not on file  Food Insecurity: Not on file  Transportation Needs: Not on file  Physical Activity: Not on file  Stress: Not on file  Social Connections: Not on file  Intimate Partner Violence: Not on file   Family History  Problem Relation Age of Onset  . Stroke Mother   . Colon polyps Mother   . Diabetes Mother   . Diabetes Father   . Kidney failure Father   . COPD Father   . Colon polyps Sister   . Diabetes Sister   . Stroke Brother   . Diabetes Brother   . Heart attack Maternal Grandmother   . Heart disease Maternal Grandfather   . Diabetes Paternal  Grandmother   . Stroke Paternal Grandfather   . Hypertension Paternal Grandfather   . Diabetes Sister   . Diabetes Sister   . Diabetes Son   . Diabetes Daughter   . Colon cancer Neg Hx   . Esophageal cancer Neg Hx   . Inflammatory bowel disease Neg Hx   . Liver disease Neg Hx   . Pancreatic cancer Neg Hx   . Rectal cancer Neg Hx   . Stomach cancer Neg Hx    I have reviewed her medical, social, and family history in detail and updated the electronic medical record as necessary.    PHYSICAL EXAMINATION  BP 140/62 (BP Location: Left Arm, Patient Position: Sitting, Cuff Size: Normal)   Pulse 80   Ht 5' 3"  (1.6 m)   Wt 165 lb 8 oz (75.1 kg)   BMI 29.32 kg/m  Wt Readings from Last 3 Encounters:  02/06/21 163 lb 6.4 oz (74.1 kg)  02/04/21 165 lb 8 oz (75.1 kg)  01/19/21 169 lb (76.7 kg)  GEN: NAD, appears elderly but doesn't appear chronically ill and is not toxic PSYCH: Cooperative, without pressured speech EYE: Conjunctivae pink, sclerae anicteric ENT: Masked CV: Nontachycardic RESP: No audible wheezing GI: NABS, soft, NT/ND, without rebound or guarding, no HSM appreciated MSK/EXT: Lower extremity edema present SKIN: No jaundice NEURO:  Alert & Oriented x 3, no focal deficits   REVIEW OF DATA  I reviewed the following data at the time of this encounter:  GI Procedures and Studies  2011 colonoscopy Moderate diverticulosis was found in the sigmoid to descending colon.  No polyps or cancers were seen.  Retroflexed views in the rectum that revealed no abnormalities.  Laboratory Studies  Reviewed those in epic  Imaging Studies  September 2021 CT abdomen pelvis without contrast IMPRESSION: Colonic diverticulosis. No radiographic evidence of diverticulitis or other acute findings. Aortic Atherosclerosis (ICD10-I70.0).   ASSESSMENT  Ms. Peterkin is a 85 y.o. female with a pmh significant for hypertension, hyperlipidemia, hypothyroidism, diabetes, chronic renal  insufficiency, prior stroke, status post cholecystectomy, prior colon polyps, diverticulosis (with history of diverticulitis).  The patient is seen today for evaluation and  management of:  1. Rectal bleeding   2. Hemorrhoids, unspecified hemorrhoid type   3. Fecal smearing   4. History of colon polyps   5. Anorexia   6. Bloating symptom    The patient is hemodynamically stable.  Patient has been experiencing episodes of rectal bleeding and fecal smearing/incontinence.  Hemorrhoidal disease most likely causing her issues.  With that being said she is due for consideration of colon cancer screening.  In setting of her symptoms and history, colonoscopy is recommended.  Due to her bloating and episodes of anorexia a diagnostic endoscopy is also recommended.  We will obtain iron indices and blood counts to ensure she does not have overt iron deficiency as well as obtain some other laboratories to rule out etiologies of her bloating and discomfort.  She may be at risk of PPI as well as SIBO we will consider that in the future.  We are going to trial Levsin for her lower abdominal discomfort that occurs prior to using the restroom, as I suspect she likely has irritable bowel syndrome.  I have asked her to initiate higher fiber foods as well as initiate Metamucil or FiberCon once to twice daily.  The risks and benefits of endoscopic evaluation were discussed with the patient; these include but are not limited to the risk of perforation, infection, bleeding, missed lesions, lack of diagnosis, severe illness requiring hospitalization, as well as anesthesia and sedation related illnesses.  The patient is agreeable to proceed.  All patient questions were answered to the best of my ability, and the patient agrees to the aforementioned plan of action with follow-up as indicated.   PLAN  Laboratories as outlined below Initiate Metamucil or FiberCon once to twice daily Initiate Levsin 1-4 times daily as needed for  lower abdominal discomfort and cramping prior or right after a bowel movement occurs (even after 4 weeks no benefit then may stop) Diagnostic endoscopy (gastric and duodenal biopsies at minimum to be obtained Screening/diagnostic colonoscopy for issues of bright red blood per rectum and ensure only hemorrhoids May use Preparation H at this time Further recommendations after endoscopic evaluation and laboratories have been performed   Orders Placed This Encounter  Procedures  . CBC  . Basic Metabolic Panel (BMET)  . IBC + Ferritin  . TSH  . Tissue transglutaminase, IgA  . IgA  . Ambulatory referral to Gastroenterology    New Prescriptions   HYOSCYAMINE (LEVSIN SL) 0.125 MG SL TABLET    Place 1 tablet (0.125 mg total) under the tongue every 4 (four) hours as needed. When having large meals   PEG-KCL-NACL-NASULF-NA ASC-C (PLENVU) 140 G SOLR    Take 1 kit by mouth as directed. Use coupon: BIN: [id number] PNC: CNRX Group: [id number] ID: [id number]   Modified Medications   No medications on file    Planned Follow Up No follow-ups on file.   Total Time in Face-to-Face and in Coordination of Care for patient including independent/personal interpretation/review of prior testing, medical history, examination, medication adjustment, communicating results with the patient directly, and documentation with the EHR is 45 minutes.  Justice Britain, MD [facility] Advanced [facility]oscopy Office # [phone]

## 2021-02-11 DIAGNOSIS — R140 Abdominal distension (gaseous): Secondary | ICD-10-CM | POA: Insufficient documentation

## 2021-02-11 DIAGNOSIS — R630 Anorexia: Secondary | ICD-10-CM | POA: Insufficient documentation

## 2021-02-12 NOTE — Progress Notes (Signed)
I have attempted to call patient multiple times and left voicemail. Please inform patient that her echo is unchanged compared to previous and there is nothing prohibitive of proceeding with GI evaluation.

## 2021-02-18 DIAGNOSIS — J45901 Unspecified asthma with (acute) exacerbation: Secondary | ICD-10-CM | POA: Diagnosis not present

## 2021-02-18 DIAGNOSIS — D649 Anemia, unspecified: Secondary | ICD-10-CM | POA: Diagnosis not present

## 2021-02-18 DIAGNOSIS — I10 Essential (primary) hypertension: Secondary | ICD-10-CM | POA: Diagnosis not present

## 2021-02-18 DIAGNOSIS — K5900 Constipation, unspecified: Secondary | ICD-10-CM | POA: Diagnosis not present

## 2021-02-18 DIAGNOSIS — E1165 Type 2 diabetes mellitus with hyperglycemia: Secondary | ICD-10-CM | POA: Diagnosis not present

## 2021-02-18 DIAGNOSIS — J4540 Moderate persistent asthma, uncomplicated: Secondary | ICD-10-CM | POA: Diagnosis not present

## 2021-02-25 ENCOUNTER — Ambulatory Visit
Admission: RE | Admit: 2021-02-25 | Discharge: 2021-02-25 | Disposition: A | Discharge status: Home / Self Care | Payer: Medicare Other | Source: Ambulatory Visit | Attending: Family Medicine | Admitting: Family Medicine

## 2021-02-25 ENCOUNTER — Other Ambulatory Visit: Payer: Self-pay

## 2021-02-25 DIAGNOSIS — E2839 Other primary ovarian failure: Secondary | ICD-10-CM

## 2021-02-25 DIAGNOSIS — Z780 Asymptomatic menopausal state: Secondary | ICD-10-CM | POA: Diagnosis not present

## 2021-02-27 DIAGNOSIS — R0602 Shortness of breath: Secondary | ICD-10-CM | POA: Diagnosis not present

## 2021-02-27 DIAGNOSIS — E0590 Thyrotoxicosis, unspecified without thyrotoxic crisis or storm: Secondary | ICD-10-CM | POA: Diagnosis not present

## 2021-02-27 DIAGNOSIS — D649 Anemia, unspecified: Secondary | ICD-10-CM | POA: Diagnosis not present

## 2021-03-03 DIAGNOSIS — I10 Essential (primary) hypertension: Secondary | ICD-10-CM | POA: Diagnosis not present

## 2021-03-03 DIAGNOSIS — E039 Hypothyroidism, unspecified: Secondary | ICD-10-CM | POA: Diagnosis not present

## 2021-03-03 DIAGNOSIS — E1165 Type 2 diabetes mellitus with hyperglycemia: Secondary | ICD-10-CM | POA: Diagnosis not present

## 2021-03-03 DIAGNOSIS — D649 Anemia, unspecified: Secondary | ICD-10-CM | POA: Diagnosis not present

## 2021-03-03 DIAGNOSIS — E1121 Type 2 diabetes mellitus with diabetic nephropathy: Secondary | ICD-10-CM | POA: Diagnosis not present

## 2021-03-03 DIAGNOSIS — J45909 Unspecified asthma, uncomplicated: Secondary | ICD-10-CM | POA: Diagnosis not present

## 2021-03-05 ENCOUNTER — Ambulatory Visit (AMBULATORY_SURGERY_CENTER): Payer: Medicare Other | Admitting: Gastroenterology

## 2021-03-05 ENCOUNTER — Other Ambulatory Visit: Payer: Self-pay

## 2021-03-05 ENCOUNTER — Other Ambulatory Visit: Payer: Self-pay | Admitting: Gastroenterology

## 2021-03-05 ENCOUNTER — Encounter: Payer: Self-pay | Admitting: Gastroenterology

## 2021-03-05 VITALS — BP 183/69 | HR 79 | Resp 11 | Ht 63.0 in | Wt 165.0 lb

## 2021-03-05 DIAGNOSIS — K635 Polyp of colon: Secondary | ICD-10-CM

## 2021-03-05 DIAGNOSIS — K259 Gastric ulcer, unspecified as acute or chronic, without hemorrhage or perforation: Secondary | ICD-10-CM

## 2021-03-05 DIAGNOSIS — K5730 Diverticulosis of large intestine without perforation or abscess without bleeding: Secondary | ICD-10-CM | POA: Diagnosis not present

## 2021-03-05 DIAGNOSIS — R140 Abdominal distension (gaseous): Secondary | ICD-10-CM

## 2021-03-05 DIAGNOSIS — K219 Gastro-esophageal reflux disease without esophagitis: Secondary | ICD-10-CM

## 2021-03-05 DIAGNOSIS — K625 Hemorrhage of anus and rectum: Secondary | ICD-10-CM | POA: Diagnosis not present

## 2021-03-05 DIAGNOSIS — K319 Disease of stomach and duodenum, unspecified: Secondary | ICD-10-CM | POA: Diagnosis not present

## 2021-03-05 DIAGNOSIS — I129 Hypertensive chronic kidney disease with stage 1 through stage 4 chronic kidney disease, or unspecified chronic kidney disease: Secondary | ICD-10-CM | POA: Diagnosis not present

## 2021-03-05 DIAGNOSIS — K648 Other hemorrhoids: Secondary | ICD-10-CM | POA: Diagnosis not present

## 2021-03-05 DIAGNOSIS — N183 Chronic kidney disease, stage 3 unspecified: Secondary | ICD-10-CM | POA: Diagnosis not present

## 2021-03-05 DIAGNOSIS — K2289 Other specified disease of esophagus: Secondary | ICD-10-CM | POA: Diagnosis not present

## 2021-03-05 DIAGNOSIS — K31A11 Gastric intestinal metaplasia without dysplasia, involving the antrum: Secondary | ICD-10-CM | POA: Diagnosis not present

## 2021-03-05 DIAGNOSIS — E1122 Type 2 diabetes mellitus with diabetic chronic kidney disease: Secondary | ICD-10-CM | POA: Diagnosis not present

## 2021-03-05 DIAGNOSIS — Z8719 Personal history of other diseases of the digestive system: Secondary | ICD-10-CM | POA: Diagnosis not present

## 2021-03-05 MED ORDER — SODIUM CHLORIDE 0.9 % IV SOLN: 500.0000 mL | Freq: Once | INTRAVENOUS | Status: DC

## 2021-03-05 MED ORDER — OMEPRAZOLE 40 MG PO CPDR: 40.0000 mg | DELAYED_RELEASE_CAPSULE | Freq: Two times a day (BID) | ORAL | 3 refills | Status: DC

## 2021-03-05 NOTE — Op Note (Signed)
[facility] Patient Name: Diana Harper Procedure Date: 03/05/2021 3:48 PM MRN: [id number] Endoscopist: Justice Britain , MD Age: 85 Referring MD:  Date of Birth: 11/03/1936 Gender: Female Account #: [id number] Procedure:                Upper GI endoscopy Indications:              Anorexia, Abdominal bloating Medicines:                Monitored Anesthesia Care Procedure:                Pre-Anesthesia Assessment:                           - Prior to the procedure, a History and Physical                            was performed, and patient medications and                            allergies were reviewed. The patient's tolerance of                            previous anesthesia was also reviewed. The risks                            and benefits of the procedure and the sedation                            options and risks were discussed with the patient.                            All questions were answered, and informed consent                            was obtained. Prior Anticoagulants: The patient has                            taken no previous anticoagulant or antiplatelet                            agents except for aspirin. ASA Grade Assessment:                            III - A patient with severe systemic disease. After                            reviewing the risks and benefits, the patient was                            deemed in satisfactory condition to undergo the                            procedure.  After obtaining informed consent, the endoscope was                            passed under direct vision. Throughout the                            procedure, the patient's blood pressure, pulse, and                            oxygen saturations were monitored continuously. The                            Endoscope was introduced through the mouth, and                            advanced to the second part of duodenum. The upper                             GI endoscopy was accomplished without difficulty.                            The patient tolerated the procedure. Scope In: Scope Out: Findings:                 A few white nummular lesions were noted in the                            entire esophagus. Biopsies were taken with a cold                            forceps for histology to rule out Candida.                           The Z-line was regular and was found 38 cm from the                            incisors.                           One non-bleeding cratered gastric ulcer with a                            clean ulcer base (Forrest Class III) was found on                            the greater curvature of the gastric antrum. The                            lesion was 8 mm in largest dimension.                           Patchy moderate inflammation characterized by  erosions, erythema and friability was found in the                            entire examined stomach.                           No other gross lesions were noted in the entire                            examined stomach. Biopsies were taken with a cold                            forceps for histology and Helicobacter pylori                            testing.                           No gross lesions were noted in the duodenal bulb,                            in the first portion of the duodenum and in the                            second portion of the duodenum. Biopsies were taken                            with a cold forceps for histology. Complications:            No immediate complications. Estimated Blood Loss:     Estimated blood loss was minimal. Impression:               - White nummular lesions in esophageal mucosa.                            Biopsied.                           - Z-line regular, 38 cm from the incisors.                           - Non-bleeding gastric ulcer with a clean ulcer                             base (Forrest Class III). Gastritis. Biopsied.                           - No gross lesions in the duodenal bulb, in the                            first portion of the duodenum and in the second                            portion of the duodenum. Biopsied. Recommendation:           -  Proceed to scheduled colonoscopy.                           - Start Omeprazole 40 mg twice daily.                           - Observe patient's clinical course.                           - Await pathology results. Treat HP if positive.                           - Repeat upper endoscopy in 3-4 months to check                            healing of gastric ulcer.                           - The findings and recommendations were discussed                            with the patient.                           - The findings and recommendations were discussed                            with the patient's family. Justice Britain, MD 03/05/2021 4:40:49 PM

## 2021-03-05 NOTE — Op Note (Signed)
[facility] Patient Name: Diana Harper Procedure Date: 03/05/2021 3:48 PM MRN: [id number] Endoscopist: Justice Britain , MD Age: 85 Referring MD:  Date of Birth: [birth year] Gender: Female Account #: [id number] Procedure:                Colonoscopy Indications:              Screening for colorectal malignant neoplasm,                            Incidental - Change in bowel habits, Incidental -                            Fecal incontinence, Fecal smearing Medicines:                Monitored Anesthesia Care Procedure:                Pre-Anesthesia Assessment:                           - Prior to the procedure, a History and Physical                            was performed, and patient medications and                            allergies were reviewed. The patient's tolerance of                            previous anesthesia was also reviewed. The risks                            and benefits of the procedure and the sedation                            options and risks were discussed with the patient.                            All questions were answered, and informed consent                            was obtained. Prior Anticoagulants: The patient has                            taken no previous anticoagulant or antiplatelet                            agents except for aspirin. ASA Grade Assessment:                            III - A patient with severe systemic disease. After                            reviewing the risks and benefits, the patient was  deemed in satisfactory condition to undergo the                            procedure.                           After obtaining informed consent, the colonoscope                            was passed under direct vision. Throughout the                            procedure, the patient's blood pressure, pulse, and                            oxygen saturations were monitored continuously. The                             Olympus PCF-H190DL ([id number]) Colonoscope was                            introduced through the anus and advanced to the the                            cecum, identified by appendiceal orifice and                            ileocecal valve. The colonoscopy was somewhat                            difficult due to restricted mobility of the colon                            and a tortuous colon. Successful completion of the                            procedure was aided by changing the patient's                            position, using manual pressure, straightening and                            shortening the scope to obtain bowel loop reduction                            and using scope torsion. The patient tolerated the                            procedure. The quality of the bowel preparation was                            adequate. The ileocecal valve, appendiceal orifice,  and rectum were photographed. Scope In: 4:11:36 PM Scope Out: 4:30:16 PM Scope Withdrawal Time: 0 hours 12 minutes 52 seconds  Total Procedure Duration: 0 hours 18 minutes 40 seconds  Findings:                 The digital rectal exam findings include                            hemorrhoids. Pertinent negatives include no                            palpable rectal lesions.                           The right colon was significantly tortuous - see                            above for maneuvers to pass this region.                           A large amount of liquid and semi-liquid stool was                            found in the entire colon, interfering with                            visualization. Lavage of the area was performed                            using copious amounts, resulting in clearance with                            adequate visualization.                           A 5 mm polyp was found in the cecum. The polyp was                             sessile. The polyp was removed with a cold snare.                            Resection and retrieval were complete.                           Multiple small and large-mouthed diverticula were                            found in the entire colon.                           Normal mucosa was found in the entire colon                            otherwise.  Bleeding non-thrombosed prolapsed hemorrhoids were                            found during retroflexion, during perianal exam and                            during digital exam. The hemorrhoids were Grade III                            (internal hemorrhoids that prolapse but require                            manual reduction) and smaller external hemorrhoids. Complications:            No immediate complications. Estimated Blood Loss:     Estimated blood loss was minimal. Impression:               - Hemorrhoids found on digital rectal exam.                           - Tortuous colon.                           - Stool in the entire examined colon - lavaged with                            adequate visualization.                           - One 5 mm polyp in the cecum, removed with a cold                            snare. Resected and retrieved.                           - Diverticulosis in the entire examined colon.                           - Normal mucosa in the entire examined colon                            otherwise.                           - Bleeding non-thrombosed prolapsed hemorrhoids. Recommendation:           - The patient will be observed post-procedure,                            until all discharge criteria are met.                           - Discharge patient to home.                           - Patient has a contact number available for  emergencies. The signs and symptoms of potential                            delayed complications were discussed with the                             patient. Return to normal activities tomorrow.                            Written discharge instructions were provided to the                            patient.                           - High fiber diet.                           - Continue Fiber supplementation as discussed in                            clinic.                           - Await pathology results.                           - Repeat colonoscopy is not recommended.                           - Take Sitz baths Daily as needed.                           - Preparation H cream: Apply externally once to BID.                           - Anusol suppositories can be considered. If so,                            then nightly for 1-week and then every other night                            until prescription is done. In future can use                            3-nights in a row and then off. If issues persist,                            will review imaging with my colleagues to consider                            band ligation with GI vs referral to Colorectal                            surgery for hemorrhoidal therapies.                           -  The findings and recommendations were discussed                            with the patient.                           - The findings and recommendations were discussed                            with the patient's family. Justice Britain, MD 03/05/2021 4:48:29 PM

## 2021-03-05 NOTE — Progress Notes (Signed)
Report given to PACU, vss 

## 2021-03-05 NOTE — Patient Instructions (Signed)
Handouts on gastritis, hemorrhoids, diverticulosis, and polyps given to you today  Await pathology results  Begin omeprazole 40 mg twice a day  High fiber diet recommended , continue fiber supplement  Use Preparation H cream 1-2 times a day as needed    YOU HAD AN ENDOSCOPIC PROCEDURE TODAY AT [facility]:   Refer to the procedure report that was given to you for any specific questions about what was found during the examination.  If the procedure report does not answer your questions, please call your gastroenterologist to clarify.  If you requested that your care partner not be given the details of your procedure findings, then the procedure report has been included in a sealed envelope for you to review at your convenience later.  YOU SHOULD EXPECT: Some feelings of bloating in the abdomen. Passage of more gas than usual.  Walking can help get rid of the air that was put into your GI tract during the procedure and reduce the bloating. If you had a lower endoscopy (such as a colonoscopy or flexible sigmoidoscopy) you may notice spotting of blood in your stool or on the toilet paper. If you underwent a bowel prep for your procedure, you may not have a normal bowel movement for a few days.  Please Note:  You might notice some irritation and congestion in your nose or some drainage.  This is from the oxygen used during your procedure.  There is no need for concern and it should clear up in a day or so.  SYMPTOMS TO REPORT IMMEDIATELY:   Following lower endoscopy (colonoscopy or flexible sigmoidoscopy):  Excessive amounts of blood in the stool  Significant tenderness or worsening of abdominal pains  Swelling of the abdomen that is new, acute  Fever of 100F or higher   Following upper endoscopy (EGD)  Vomiting of blood or coffee ground material  New chest pain or pain under the shoulder blades  Painful or persistently difficult swallowing  New shortness of breath  Fever of  100F or higher  Black, tarry-looking stools  For urgent or emergent issues, a gastroenterologist can be reached at any hour by calling [phone]. Do not use MyChart messaging for urgent concerns.    DIET:  We do recommend a small meal at first, but then you may proceed to your regular diet.  Drink plenty of fluids but you should avoid alcoholic beverages for 24 hours.  ACTIVITY:  You should plan to take it easy for the rest of today and you should NOT DRIVE or use heavy machinery until tomorrow (because of the sedation medicines used during the test).    FOLLOW UP: Our staff will call the number listed on your records 48-72 hours following your procedure to check on you and address any questions or concerns that you may have regarding the information given to you following your procedure. If we do not reach you, we will leave a message.  We will attempt to reach you two times.  During this call, we will ask if you have developed any symptoms of COVID 19. If you develop any symptoms (ie: fever, flu-like symptoms, shortness of breath, cough etc.) before then, please call [phone].  If you test positive for Covid 19 in the 2 weeks post procedure, please call and report this information to [name].    If any biopsies were taken you will be contacted by phone or by letter within the next 1-3 weeks.  Please call us at [phone]  [phone] if you have not heard about the biopsies in 3 weeks.    SIGNATURES/CONFIDENTIALITY: You and/or your care partner have signed paperwork which will be entered into your electronic medical record.  These signatures attest to the fact that that the information above on your After Visit Summary has been reviewed and is understood.  Full responsibility of the confidentiality of this discharge information lies with you and/or your care-partner.

## 2021-03-05 NOTE — Progress Notes (Signed)
[facility]  History reviewed.

## 2021-03-05 NOTE — Progress Notes (Signed)
Called to room to assist during endoscopic procedure.  Patient ID and intended procedure confirmed with present staff. Received instructions for my participation in the procedure from the performing physician.  

## 2021-03-05 NOTE — Progress Notes (Signed)
1555 Robinul 0.1 mg IV given due large amount of secretions upon assessment.  MD made aware, vss  

## 2021-03-05 NOTE — Progress Notes (Signed)
Will hold off on scheduling repeat egd at this time per Dr. Rush Landmark. Follow up in office in 6-8 weeks.

## 2021-03-09 ENCOUNTER — Telehealth: Payer: Self-pay

## 2021-03-09 NOTE — Telephone Encounter (Signed)
  Follow up Call-  Call back number 03/05/2021  Post procedure Call Back phone  # [phone]  Permission to leave phone message Yes  Some recent data might be hidden     Patient questions:  Do you have a fever, pain , or abdominal swelling? No. Pain Score  0 *  Have you tolerated food without any problems? Yes.    Have you been able to return to your normal activities? Yes.    Do you have any questions about your discharge instructions: Diet   No. Medications  No. Follow up visit  No.  Do you have questions or concerns about your Care? No.  Actions: * If pain score is 4 or above: No action needed, pain <4. 1. Have you developed a fever since your procedure? no  2.   Have you had an respiratory symptoms (SOB or cough) since your procedure? no  3.   Have you tested positive for COVID 19 since your procedure no  4.   Have you had any family members/close contacts diagnosed with the COVID 19 since your procedure?  no   If yes to any of these questions please route to Joylene John, RN and Joella Prince, RN

## 2021-03-20 ENCOUNTER — Encounter: Payer: Self-pay | Admitting: Gastroenterology

## 2021-03-31 DIAGNOSIS — E039 Hypothyroidism, unspecified: Secondary | ICD-10-CM | POA: Diagnosis not present

## 2021-03-31 DIAGNOSIS — E1122 Type 2 diabetes mellitus with diabetic chronic kidney disease: Secondary | ICD-10-CM | POA: Diagnosis not present

## 2021-03-31 DIAGNOSIS — E11319 Type 2 diabetes mellitus with unspecified diabetic retinopathy without macular edema: Secondary | ICD-10-CM | POA: Diagnosis not present

## 2021-03-31 DIAGNOSIS — N1832 Chronic kidney disease, stage 3b: Secondary | ICD-10-CM | POA: Diagnosis not present

## 2021-03-31 DIAGNOSIS — D638 Anemia in other chronic diseases classified elsewhere: Secondary | ICD-10-CM | POA: Diagnosis not present

## 2021-03-31 DIAGNOSIS — N179 Acute kidney failure, unspecified: Secondary | ICD-10-CM | POA: Diagnosis not present

## 2021-03-31 DIAGNOSIS — E1165 Type 2 diabetes mellitus with hyperglycemia: Secondary | ICD-10-CM | POA: Diagnosis not present

## 2021-03-31 DIAGNOSIS — I129 Hypertensive chronic kidney disease with stage 1 through stage 4 chronic kidney disease, or unspecified chronic kidney disease: Secondary | ICD-10-CM | POA: Diagnosis not present

## 2021-04-06 ENCOUNTER — Encounter (HOSPITAL_COMMUNITY): Payer: Self-pay

## 2021-04-06 ENCOUNTER — Other Ambulatory Visit: Payer: Self-pay

## 2021-04-06 ENCOUNTER — Emergency Department (HOSPITAL_COMMUNITY): Payer: Medicare Other

## 2021-04-06 ENCOUNTER — Inpatient Hospital Stay (HOSPITAL_COMMUNITY)
Admission: EM | Admit: 2021-04-06 | Discharge: 2021-04-09 | DRG: 392 | Disposition: A | Discharge status: Home / Self Care | Payer: Medicare Other | Attending: Family Medicine | Admitting: Family Medicine

## 2021-04-06 DIAGNOSIS — Z823 Family history of stroke: Secondary | ICD-10-CM

## 2021-04-06 DIAGNOSIS — Z825 Family history of asthma and other chronic lower respiratory diseases: Secondary | ICD-10-CM

## 2021-04-06 DIAGNOSIS — R630 Anorexia: Secondary | ICD-10-CM | POA: Diagnosis present

## 2021-04-06 DIAGNOSIS — N179 Acute kidney failure, unspecified: Secondary | ICD-10-CM | POA: Diagnosis present

## 2021-04-06 DIAGNOSIS — E785 Hyperlipidemia, unspecified: Secondary | ICD-10-CM | POA: Diagnosis present

## 2021-04-06 DIAGNOSIS — Z8371 Family history of colonic polyps: Secondary | ICD-10-CM | POA: Diagnosis not present

## 2021-04-06 DIAGNOSIS — E559 Vitamin D deficiency, unspecified: Secondary | ICD-10-CM | POA: Diagnosis present

## 2021-04-06 DIAGNOSIS — Z974 Presence of external hearing-aid: Secondary | ICD-10-CM

## 2021-04-06 DIAGNOSIS — Z91040 Latex allergy status: Secondary | ICD-10-CM | POA: Diagnosis not present

## 2021-04-06 DIAGNOSIS — Z961 Presence of intraocular lens: Secondary | ICD-10-CM | POA: Diagnosis present

## 2021-04-06 DIAGNOSIS — N183 Chronic kidney disease, stage 3 unspecified: Secondary | ICD-10-CM | POA: Diagnosis present

## 2021-04-06 DIAGNOSIS — Z79890 Hormone replacement therapy (postmenopausal): Secondary | ICD-10-CM

## 2021-04-06 DIAGNOSIS — N1832 Chronic kidney disease, stage 3b: Secondary | ICD-10-CM | POA: Diagnosis present

## 2021-04-06 DIAGNOSIS — K5792 Diverticulitis of intestine, part unspecified, without perforation or abscess without bleeding: Secondary | ICD-10-CM | POA: Diagnosis not present

## 2021-04-06 DIAGNOSIS — Z833 Family history of diabetes mellitus: Secondary | ICD-10-CM

## 2021-04-06 DIAGNOSIS — I129 Hypertensive chronic kidney disease with stage 1 through stage 4 chronic kidney disease, or unspecified chronic kidney disease: Secondary | ICD-10-CM | POA: Diagnosis present

## 2021-04-06 DIAGNOSIS — E1122 Type 2 diabetes mellitus with diabetic chronic kidney disease: Secondary | ICD-10-CM | POA: Diagnosis present

## 2021-04-06 DIAGNOSIS — Z9841 Cataract extraction status, right eye: Secondary | ICD-10-CM

## 2021-04-06 DIAGNOSIS — I639 Cerebral infarction, unspecified: Secondary | ICD-10-CM | POA: Diagnosis present

## 2021-04-06 DIAGNOSIS — I700 Atherosclerosis of aorta: Secondary | ICD-10-CM | POA: Diagnosis present

## 2021-04-06 DIAGNOSIS — F32A Depression, unspecified: Secondary | ICD-10-CM | POA: Diagnosis present

## 2021-04-06 DIAGNOSIS — I10 Essential (primary) hypertension: Secondary | ICD-10-CM | POA: Diagnosis present

## 2021-04-06 DIAGNOSIS — E039 Hypothyroidism, unspecified: Secondary | ICD-10-CM | POA: Diagnosis present

## 2021-04-06 DIAGNOSIS — Z8673 Personal history of transient ischemic attack (TIA), and cerebral infarction without residual deficits: Secondary | ICD-10-CM | POA: Diagnosis present

## 2021-04-06 DIAGNOSIS — Z9842 Cataract extraction status, left eye: Secondary | ICD-10-CM

## 2021-04-06 DIAGNOSIS — Z6829 Body mass index (BMI) 29.0-29.9, adult: Secondary | ICD-10-CM

## 2021-04-06 DIAGNOSIS — Z794 Long term (current) use of insulin: Secondary | ICD-10-CM

## 2021-04-06 DIAGNOSIS — Z20822 Contact with and (suspected) exposure to covid-19: Secondary | ICD-10-CM | POA: Diagnosis present

## 2021-04-06 DIAGNOSIS — Z7982 Long term (current) use of aspirin: Secondary | ICD-10-CM

## 2021-04-06 DIAGNOSIS — J449 Chronic obstructive pulmonary disease, unspecified: Secondary | ICD-10-CM | POA: Diagnosis present

## 2021-04-06 DIAGNOSIS — K5732 Diverticulitis of large intestine without perforation or abscess without bleeding: Secondary | ICD-10-CM | POA: Diagnosis present

## 2021-04-06 DIAGNOSIS — Z8249 Family history of ischemic heart disease and other diseases of the circulatory system: Secondary | ICD-10-CM

## 2021-04-06 DIAGNOSIS — Z888 Allergy status to other drugs, medicaments and biological substances status: Secondary | ICD-10-CM | POA: Diagnosis not present

## 2021-04-06 DIAGNOSIS — Z79899 Other long term (current) drug therapy: Secondary | ICD-10-CM

## 2021-04-06 DIAGNOSIS — F419 Anxiety disorder, unspecified: Secondary | ICD-10-CM | POA: Diagnosis present

## 2021-04-06 DIAGNOSIS — N189 Chronic kidney disease, unspecified: Secondary | ICD-10-CM | POA: Diagnosis not present

## 2021-04-06 LAB — CBC WITH DIFFERENTIAL/PLATELET
Abs Immature Granulocytes: 0.04 10*3/uL (ref 0.00–0.07)
Basophils Absolute: 0.1 10*3/uL (ref 0.0–0.1)
Basophils Relative: 1 %
Eosinophils Absolute: 0.1 10*3/uL (ref 0.0–0.5)
Eosinophils Relative: 1 %
HCT: 37.7 % (ref 36.0–46.0)
Hemoglobin: 12.4 g/dL (ref 12.0–15.0)
Immature Granulocytes: 0 %
Lymphocytes Relative: 20 %
Lymphs Abs: 1.8 10*3/uL (ref 0.7–4.0)
MCH: 29.5 pg (ref 26.0–34.0)
MCHC: 32.9 g/dL (ref 30.0–36.0)
MCV: 89.5 fL (ref 80.0–100.0)
Monocytes Absolute: 0.6 10*3/uL (ref 0.1–1.0)
Monocytes Relative: 7 %
Neutro Abs: 6.5 10*3/uL (ref 1.7–7.7)
Neutrophils Relative %: 71 %
Platelets: 250 10*3/uL (ref 150–400)
RBC: 4.21 MIL/uL (ref 3.87–5.11)
RDW: 14.2 % (ref 11.5–15.5)
WBC: 9.1 10*3/uL (ref 4.0–10.5)
nRBC: 0 % (ref 0.0–0.2)

## 2021-04-06 LAB — COMPREHENSIVE METABOLIC PANEL
ALT: 35 U/L (ref 0–44)
AST: 35 U/L (ref 15–41)
Albumin: 4.5 g/dL (ref 3.5–5.0)
Alkaline Phosphatase: 58 U/L (ref 38–126)
Anion gap: 9 (ref 5–15)
BUN: 36 mg/dL — ABNORMAL HIGH (ref 8–23)
CO2: 26 mmol/L (ref 22–32)
Calcium: 10.1 mg/dL (ref 8.9–10.3)
Chloride: 103 mmol/L (ref 98–111)
Creatinine, Ser: 1.71 mg/dL — ABNORMAL HIGH (ref 0.44–1.00)
GFR, Estimated: 30 mL/min — ABNORMAL LOW (ref >60)
Glucose, Bld: 225 mg/dL — ABNORMAL HIGH (ref 70–99)
Potassium: 4.2 mmol/L (ref 3.5–5.1)
Sodium: 138 mmol/L (ref 135–145)
Total Bilirubin: 0.6 mg/dL (ref 0.3–1.2)
Total Protein: 8.4 g/dL — ABNORMAL HIGH (ref 6.5–8.1)

## 2021-04-06 LAB — URINALYSIS, ROUTINE W REFLEX MICROSCOPIC
Bacteria, UA: NONE SEEN
Bilirubin Urine: NEGATIVE
Glucose, UA: 50 mg/dL — AB
Ketones, ur: NEGATIVE mg/dL
Nitrite: NEGATIVE
Protein, ur: 30 mg/dL — AB
Specific Gravity, Urine: 1.012 (ref 1.005–1.030)
pH: 5 (ref 5.0–8.0)

## 2021-04-06 LAB — HEMOGLOBIN A1C
Hgb A1c MFr Bld: 7.8 % — ABNORMAL HIGH (ref 4.8–5.6)
Mean Plasma Glucose: 177.16 mg/dL

## 2021-04-06 LAB — CBG MONITORING, ED: Glucose-Capillary: 215 mg/dL — ABNORMAL HIGH (ref 70–99)

## 2021-04-06 LAB — GLUCOSE, CAPILLARY
Glucose-Capillary: 141 mg/dL — ABNORMAL HIGH (ref 70–99)
Glucose-Capillary: 148 mg/dL — ABNORMAL HIGH (ref 70–99)

## 2021-04-06 LAB — RESP PANEL BY RT-PCR (FLU A&B, COVID) ARPGX2
Influenza A by PCR: NEGATIVE
Influenza B by PCR: NEGATIVE
SARS Coronavirus 2 by RT PCR: NEGATIVE

## 2021-04-06 LAB — LIPASE, BLOOD: Lipase: 105 U/L — ABNORMAL HIGH (ref 11–51)

## 2021-04-06 MED ORDER — PANTOPRAZOLE SODIUM 40 MG IV SOLR
40.0000 mg | INTRAVENOUS | Status: DC
  Administered 2021-04-06 – 2021-04-07 (×2): 40 mg via INTRAVENOUS
  Filled 2021-04-06 (×2): qty 40

## 2021-04-06 MED ORDER — ENOXAPARIN SODIUM 40 MG/0.4ML IJ SOSY: 40.0000 mg | PREFILLED_SYRINGE | INTRAMUSCULAR | Status: DC

## 2021-04-06 MED ORDER — AMLODIPINE BESYLATE 10 MG PO TABS
10.0000 mg | ORAL_TABLET | Freq: Every day | ORAL | Status: DC
  Administered 2021-04-08 – 2021-04-09 (×2): 10 mg via ORAL
  Filled 2021-04-06 (×3): qty 1

## 2021-04-06 MED ORDER — ALBUTEROL SULFATE HFA 108 (90 BASE) MCG/ACT IN AERS
1.0000 | INHALATION_SPRAY | Freq: Four times a day (QID) | RESPIRATORY_TRACT | Status: DC | PRN
  Administered 2021-04-07: 2 via RESPIRATORY_TRACT
  Filled 2021-04-06: qty 6.7

## 2021-04-06 MED ORDER — ENOXAPARIN SODIUM 30 MG/0.3ML IJ SOSY
30.0000 mg | PREFILLED_SYRINGE | INTRAMUSCULAR | Status: DC
  Administered 2021-04-06 – 2021-04-07 (×2): 30 mg via SUBCUTANEOUS
  Filled 2021-04-06 (×3): qty 0.3

## 2021-04-06 MED ORDER — ONDANSETRON HCL 4 MG PO TABS: 4.0000 mg | ORAL_TABLET | Freq: Four times a day (QID) | ORAL | Status: DC | PRN

## 2021-04-06 MED ORDER — MORPHINE SULFATE (PF) 4 MG/ML IV SOLN
4.0000 mg | Freq: Once | INTRAVENOUS | Status: AC
Start: 2021-04-06 — End: 2021-04-06
  Administered 2021-04-06: 4 mg via INTRAVENOUS
  Filled 2021-04-06: qty 1

## 2021-04-06 MED ORDER — SODIUM CHLORIDE 0.9 % IV BOLUS
500.0000 mL | Freq: Once | INTRAVENOUS | Status: AC
  Administered 2021-04-06: 500 mL via INTRAVENOUS

## 2021-04-06 MED ORDER — NITROGLYCERIN 0.4 MG SL SUBL: 0.4000 mg | SUBLINGUAL_TABLET | SUBLINGUAL | Status: DC | PRN

## 2021-04-06 MED ORDER — ONDANSETRON HCL 4 MG/2ML IJ SOLN
2.0000 mg | Freq: Once | INTRAMUSCULAR | Status: AC
  Administered 2021-04-06: 2 mg via INTRAVENOUS
  Filled 2021-04-06: qty 2

## 2021-04-06 MED ORDER — SODIUM CHLORIDE 0.45 % IV SOLN: INTRAVENOUS | Status: DC

## 2021-04-06 MED ORDER — HYDROCHLOROTHIAZIDE 25 MG PO TABS
25.0000 mg | ORAL_TABLET | Freq: Every morning | ORAL | Status: DC
  Administered 2021-04-06 – 2021-04-07 (×2): 25 mg via ORAL
  Filled 2021-04-06 (×3): qty 1

## 2021-04-06 MED ORDER — CALCIUM CARBONATE 1250 (500 CA) MG PO TABS
1.0000 | ORAL_TABLET | Freq: Every day | ORAL | Status: DC
  Administered 2021-04-07 – 2021-04-09 (×3): 500 mg via ORAL
  Filled 2021-04-06 (×3): qty 1

## 2021-04-06 MED ORDER — FERROUS SULFATE 325 (65 FE) MG PO TABS
325.0000 mg | ORAL_TABLET | Freq: Every day | ORAL | Status: DC
  Administered 2021-04-07 – 2021-04-09 (×3): 325 mg via ORAL
  Filled 2021-04-06 (×3): qty 1

## 2021-04-06 MED ORDER — LORATADINE 10 MG PO TABS: 10.0000 mg | ORAL_TABLET | Freq: Every day | ORAL | Status: DC | PRN

## 2021-04-06 MED ORDER — MORPHINE SULFATE (PF) 4 MG/ML IV SOLN
4.0000 mg | Freq: Once | INTRAVENOUS | Status: AC
  Administered 2021-04-06: 4 mg via INTRAVENOUS
  Filled 2021-04-06: qty 1

## 2021-04-06 MED ORDER — SERTRALINE HCL 50 MG PO TABS
150.0000 mg | ORAL_TABLET | Freq: Every day | ORAL | Status: DC
  Administered 2021-04-06 – 2021-04-08 (×3): 150 mg via ORAL
  Filled 2021-04-06 (×3): qty 1

## 2021-04-06 MED ORDER — GABAPENTIN 100 MG PO CAPS: 100.0000 mg | ORAL_CAPSULE | Freq: Two times a day (BID) | ORAL | Status: DC | PRN

## 2021-04-06 MED ORDER — VITAMIN D3 25 MCG (1000 UNIT) PO TABS
1000.0000 [IU] | ORAL_TABLET | Freq: Every day | ORAL | Status: DC
  Administered 2021-04-06 – 2021-04-09 (×4): 1000 [IU] via ORAL
  Filled 2021-04-06 (×5): qty 1

## 2021-04-06 MED ORDER — LEVOTHYROXINE SODIUM 25 MCG PO TABS
25.0000 ug | ORAL_TABLET | Freq: Every day | ORAL | Status: DC
  Administered 2021-04-07 – 2021-04-09 (×3): 25 ug via ORAL
  Filled 2021-04-06 (×3): qty 1

## 2021-04-06 MED ORDER — ASPIRIN EC 81 MG PO TBEC
81.0000 mg | DELAYED_RELEASE_TABLET | Freq: Every day | ORAL | Status: DC
  Administered 2021-04-06 – 2021-04-09 (×4): 81 mg via ORAL
  Filled 2021-04-06 (×4): qty 1

## 2021-04-06 MED ORDER — ONDANSETRON HCL 4 MG/2ML IJ SOLN
4.0000 mg | Freq: Four times a day (QID) | INTRAMUSCULAR | Status: DC | PRN
  Administered 2021-04-06: 4 mg via INTRAVENOUS
  Filled 2021-04-06: qty 2

## 2021-04-06 MED ORDER — LOSARTAN POTASSIUM 50 MG PO TABS
50.0000 mg | ORAL_TABLET | Freq: Every day | ORAL | Status: DC
  Administered 2021-04-06 – 2021-04-07 (×2): 50 mg via ORAL
  Filled 2021-04-06 (×2): qty 1

## 2021-04-06 MED ORDER — MOMETASONE FURO-FORMOTEROL FUM 200-5 MCG/ACT IN AERO
2.0000 | INHALATION_SPRAY | Freq: Two times a day (BID) | RESPIRATORY_TRACT | Status: DC
  Administered 2021-04-06 – 2021-04-09 (×6): 2 via RESPIRATORY_TRACT
  Filled 2021-04-06: qty 8.8

## 2021-04-06 MED ORDER — MORPHINE SULFATE (PF) 2 MG/ML IV SOLN
2.0000 mg | INTRAVENOUS | Status: DC | PRN
Start: 2021-04-06 — End: 2021-04-08

## 2021-04-06 MED ORDER — ACETAMINOPHEN 500 MG PO TABS: 1000.0000 mg | ORAL_TABLET | Freq: Four times a day (QID) | ORAL | Status: DC | PRN

## 2021-04-06 MED ORDER — PIPERACILLIN-TAZOBACTAM 3.375 G IVPB 30 MIN
3.3750 g | Freq: Once | INTRAVENOUS | Status: AC
  Administered 2021-04-06: 3.375 g via INTRAVENOUS
  Filled 2021-04-06: qty 50

## 2021-04-06 MED ORDER — INSULIN ASPART 100 UNIT/ML IJ SOLN
0.0000 [IU] | INTRAMUSCULAR | Status: DC
  Administered 2021-04-06 – 2021-04-07 (×3): 2 [IU] via SUBCUTANEOUS
  Administered 2021-04-07: 5 [IU] via SUBCUTANEOUS
  Administered 2021-04-07: 8 [IU] via SUBCUTANEOUS
  Administered 2021-04-07: 5 [IU] via SUBCUTANEOUS
  Administered 2021-04-07: 3 [IU] via SUBCUTANEOUS
  Administered 2021-04-07: 2 [IU] via SUBCUTANEOUS
  Administered 2021-04-08 (×2): 3 [IU] via SUBCUTANEOUS
  Administered 2021-04-08: 11 [IU] via SUBCUTANEOUS
  Administered 2021-04-08: 5 [IU] via SUBCUTANEOUS
  Administered 2021-04-09 (×3): 3 [IU] via SUBCUTANEOUS
  Filled 2021-04-06: qty 0.15

## 2021-04-06 MED ORDER — METRONIDAZOLE 500 MG/100ML IV SOLN
500.0000 mg | Freq: Once | INTRAVENOUS | Status: AC
  Administered 2021-04-06: 500 mg via INTRAVENOUS
  Filled 2021-04-06: qty 100

## 2021-04-06 MED ORDER — CLONIDINE HCL 0.2 MG/24HR TD PTWK
0.2000 mg | MEDICATED_PATCH | TRANSDERMAL | Status: DC
  Administered 2021-04-09: 0.2 mg via TRANSDERMAL
  Filled 2021-04-06: qty 1

## 2021-04-06 MED ORDER — PIPERACILLIN-TAZOBACTAM 3.375 G IVPB
3.3750 g | Freq: Three times a day (TID) | INTRAVENOUS | Status: DC
  Administered 2021-04-06 – 2021-04-07 (×3): 3.375 g via INTRAVENOUS
  Filled 2021-04-06 (×3): qty 50

## 2021-04-06 MED ORDER — CYCLOBENZAPRINE HCL 10 MG PO TABS: 10.0000 mg | ORAL_TABLET | Freq: Three times a day (TID) | ORAL | Status: DC | PRN

## 2021-04-06 NOTE — ED Provider Notes (Signed)
[facility] DEPT Provider Note   CSN: [id number] Arrival date & time: 04/06/21  0623     History Chief Complaint  Patient presents with  . Flank Pain    Diana Harper is a 85 y.o. female.  85 year old female with prior medical history detailed below presents for evaluation.  Patient reports right-sided flank and low abdominal discomfort.  Onset of symptoms was gradually over the course of yesterday.  She reports associated nausea and vomiting.  She denies fever.  She denies urinary or bowel movement changes.  She reports taking Tylenol at home with some improvement in her symptoms.  The history is provided by the patient and medical records.  Flank Pain This is a new problem. The current episode started yesterday. The problem occurs rarely. The problem has not changed since onset.Associated symptoms include abdominal pain. Pertinent negatives include no chest pain, no headaches and no shortness of breath. Nothing aggravates the symptoms. Nothing relieves the symptoms.       Past Medical History:  Diagnosis Date  . Anemia   . Anxiety   . Arthritis   . Asthma   . Biliary colic   . Cholelithiasis   . CKD (chronic kidney disease), stage III [facility])    nephrologist--  dr Meredeth Ide  . Colon polyps   . Diverticulosis of colon   . Gastric ulcer   . Glaucoma   . History of asthma    1980's  no longer problem since 1980's  . History of diverticulitis of colon    recurrent--  2014;  2013;  2012  . Hyperlipidemia   . Hypertension   . Hypothyroidism   . Insulin dependent type 2 diabetes mellitus ([place])    followed by dr Jenny Reichmann lambeth ([facility])   . Murmur   . Peripheral neuropathy   . Stroke ([place])   . Vitamin D deficiency   . Wears hearing aid    bilateral  . Wears partial dentures    lower partial and upper full    Patient Active Problem List   Diagnosis Date Noted  . Anorexia 02/11/2021  . Bloating symptom 02/11/2021  . History of colon  polyps 02/09/2021  . Hemorrhoids 02/09/2021  . Rectal bleeding 02/09/2021  . Fecal smearing 02/09/2021  . AKI (acute kidney injury) ([place]) 12/13/2019  . Acute kidney injury ([place]) 12/12/2019  . Diarrhea 12/12/2019  . Dehydration 12/12/2019  . Cerebral thrombosis with cerebral infarction 10/10/2017  . Cerebral embolism with cerebral infarction 10/10/2017  . Subarachnoid hemorrhage 10/10/2017  . Intracerebral hemorrhage 10/10/2017  . CVA (cerebral vascular accident) ([place]) 10/10/2017  . Chronic cholecystitis with calculus 08/25/2016  . Syncope 11/15/2011  . Diverticulitis 11/15/2011    Past Surgical History:  Procedure Laterality Date  . ABDOMINAL HYSTERECTOMY  1970's  . CARDIOVASCULAR STRESS TEST  07/30/2009   normal exercise lexiscan nuclear study w/ no ischemia/  normal LV funciton and wall motion, ef 77%  . CATARACT EXTRACTION W/ INTRAOCULAR LENS  IMPLANT, BILATERAL  2014  . LAPAROSCOPIC CHOLECYSTECTOMY SINGLE SITE WITH INTRAOPERATIVE CHOLANGIOGRAM N/A 08/25/2016   Procedure: LAPAROSCOPIC CHOLECYSTECTOMY WITH INTRAOPERATIVE CHOLANGIOGRAM;  Surgeon: [name], MD;  Location: [facility];  Service: General;  Laterality: N/A;  . LUMBAR SPINE SURGERY  2014  . REMOVAL AXILLA CYST Right 1990's     OB History   No obstetric history on file.     Family History  Problem Relation Age of Onset  . Stroke Mother   . Colon polyps Mother   .  Diabetes Mother   . Diabetes Father   . Kidney failure Father   . COPD Father   . Colon polyps Sister   . Diabetes Sister   . Stroke Brother   . Diabetes Brother   . Heart attack Maternal Grandmother   . Heart disease Maternal Grandfather   . Diabetes Paternal Grandmother   . Stroke Paternal Grandfather   . Hypertension Paternal Grandfather   . Diabetes Sister   . Diabetes Sister   . Diabetes Son   . Diabetes Daughter   . Colon cancer Neg Hx   . Esophageal cancer Neg Hx   . Inflammatory bowel disease Neg Hx   .  Liver disease Neg Hx   . Pancreatic cancer Neg Hx   . Rectal cancer Neg Hx   . Stomach cancer Neg Hx     Social History   Tobacco Use  . Smoking status: Never Smoker  . Smokeless tobacco: Never Used  Vaping Use  . Vaping Use: Never used  Substance Use Topics  . Alcohol use: No  . Drug use: No    Home Medications Prior to Admission medications   Medication Sig Start Date End Date Taking? Authorizing Provider  acetaminophen (TYLENOL) 500 MG tablet Take 1,000 mg by mouth every 6 (six) hours as needed for mild pain, moderate pain, fever or headache.    [provider]  albuterol (VENTOLIN HFA) 108 (90 Base) MCG/ACT inhaler Inhale 1-2 puffs into the lungs as directed. 02/04/21   [provider]  amLODipine (NORVASC) 10 MG tablet Take 10 mg by mouth daily with breakfast.    [provider]  aspirin 81 MG EC tablet Take by mouth. 10/12/17   [provider]  atorvastatin (LIPITOR) 40 MG tablet Take 1 tablet (40 mg total) daily by mouth. Patient taking differently: Take 20 mg by mouth daily. 10/11/17   Nita Sells, MD  Calcium Carbonate (CALTRATE 600 PO) Take 1 tablet by mouth daily.    [provider]  Cholecalciferol 1000 units capsule Take 5,000 Units by mouth daily.     [provider]  cloNIDine (CATAPRES - DOSED IN MG/24 HR) 0.1 mg/24hr patch 0.1 mg once a week. 01/05/21   [provider]  cyclobenzaprine (FLEXERIL) 10 MG tablet Take 10 mg by mouth 3 (three) times daily as needed for muscle spasms.    [provider]  gabapentin (NEURONTIN) 100 MG capsule Take 100-200 mg at bedtime as needed by mouth (nerve pain).     [provider]  Glucagon HCl 1 MG SOLR Inject as directed as needed.    [provider]  hydrochlorothiazide (HYDRODIURIL) 25 MG tablet Take 25 mg by mouth every morning. 11/26/19   [provider]  hyoscyamine (LEVSIN SL) 0.125 MG SL tablet Place 1 tablet (0.125 mg  total) under the tongue every 4 (four) hours as needed. When having large meals 02/04/21   Mansouraty, Telford Nab., MD  Insulin Aspart (NOVOLOG FLEXPEN [place]) Inject 20 Units into the skin daily after supper.    [provider]  insulin degludec (TRESIBA) 200 UNIT/ML FlexTouch Pen Inject 90 Units into the skin daily with supper.     [provider]  levothyroxine (SYNTHROID) 25 MCG tablet Take 25 mcg by mouth every morning. 11/10/20   [provider]  loratadine (CLARITIN) 10 MG tablet Take 10 mg daily as needed by mouth (seasonal allergies).     [provider]  losartan (COZAAR) 100 MG tablet Take 25  mg by mouth daily. 11/26/19   [provider]  nitroGLYCERIN (NITROSTAT) 0.4 MG SL tablet Place 1 tablet (0.4 mg total) under the tongue every 5 (five) minutes as needed for chest pain. 01/20/21 04/20/21  Cantwell, Celeste C, PA-C  omeprazole (PRILOSEC) 40 MG capsule Take 1 capsule (40 mg total) by mouth 2 (two) times daily. 03/05/21   Mansouraty, Telford Nab., MD  sertraline (ZOLOFT) 50 MG tablet Take 150 mg by mouth daily.     [provider]  traMADol (ULTRAM) 50 MG tablet Take 50 mg by mouth as needed.    [provider]    Allergies    Latex, Onion, Caffeine, Lisinopril, Metformin and related, Simvastatin, Tetracycline, and Zithromax [azithromycin]  Review of Systems   Review of Systems  Respiratory: Negative for shortness of breath.   Cardiovascular: Negative for chest pain.  Gastrointestinal: Positive for abdominal pain.  Genitourinary: Positive for flank pain.  Neurological: Negative for headaches.  All other systems reviewed and are negative.   Physical Exam Updated Vital Signs BP (!) 148/74 (BP Location: Right Arm)   Pulse 87   Temp 98.8 F (37.1 C) (Oral)   Resp 14   Ht 5\' 3"  (1.6 m)   Wt 74.8 kg   SpO2 98%   BMI 29.21 kg/m   Physical Exam Vitals and nursing note reviewed.  Constitutional:      General: She is not  in acute distress.    Appearance: Normal appearance. She is well-developed.  HENT:     Head: Normocephalic and atraumatic.  Eyes:     Conjunctiva/sclera: Conjunctivae normal.     Pupils: Pupils are equal, round, and reactive to light.  Cardiovascular:     Rate and Rhythm: Normal rate and regular rhythm.     Heart sounds: Normal heart sounds.  Pulmonary:     Effort: Pulmonary effort is normal. No respiratory distress.     Breath sounds: Normal breath sounds.  Abdominal:     General: There is no distension.     Palpations: Abdomen is soft.     Tenderness: There is abdominal tenderness.     Comments: Mild diffuse tenderness to right flank and right lower quadrant of abdomen  Musculoskeletal:        General: No deformity. Normal range of motion.     Cervical back: Normal range of motion and neck supple.  Skin:    General: Skin is warm and dry.  Neurological:     General: No focal deficit present.     Mental Status: She is alert and oriented to person, place, and time.     ED Results / Procedures / Treatments   Labs (all labs ordered are listed, but only abnormal results are displayed) Labs Reviewed  COMPREHENSIVE METABOLIC PANEL - Abnormal; Notable for the following components:      Result Value   Glucose, Bld 225 (*)    BUN 36 (*)    Creatinine, Ser 1.71 (*)    Total Protein 8.4 (*)    GFR, Estimated 30 (*)    All other components within normal limits  LIPASE, BLOOD - Abnormal; Notable for the following components:   Lipase 105 (*)    All other components within normal limits  CBG MONITORING, ED - Abnormal; Notable for the following components:   Glucose-Capillary 215 (*)    All other components within normal limits  RESP PANEL BY RT-PCR (FLU A&B, COVID) ARPGX2  CBC WITH DIFFERENTIAL/PLATELET  URINALYSIS, ROUTINE W REFLEX MICROSCOPIC  EKG None  Radiology CT ABDOMEN PELVIS WO CONTRAST  Result Date: 04/06/2021 CLINICAL DATA:  85 year old female with right flank  pain since yesterday. EXAM: CT ABDOMEN AND PELVIS WITHOUT CONTRAST TECHNIQUE: Multidetector CT imaging of the abdomen and pelvis was performed following the standard protocol without IV contrast. COMPARISON:  CT Abdomen and Pelvis 08/19/2020 and earlier. FINDINGS: Lower chest: Stable since last year with mild linear scarring and atelectasis in the lingula, and mildly involving the right minor fissure. No pericardial or pleural effusion. Hepatobiliary: Chronically absent gallbladder. Stable noncontrast liver. Pancreas: Negative. Spleen: Negative. Adrenals/Urinary Tract: Normal adrenal glands. Stable and negative noncontrast kidneys. Decompressed ureters. Urinary bladder remains normal. Stomach/Bowel: Negative rectum. Diverticulosis throughout the sigmoid colon with active inflammation involving a 3-4 cm segment of proximal to mid sigmoid seen on series 2 images 64 and 65. Underlying wall thickening. No extraluminal gas. Extensive diverticulosis continues in the descending colon, and moderately affects the transverse and right colon. No other active inflammation identified. Normal appendix in the right hemipelvis on coronal image 94. Decompressed and negative terminal ileum. No dilated or abnormal small bowel. Negative stomach and duodenum. No free fluid. Vascular/Lymphatic: Aortoiliac calcified atherosclerosis. Normal caliber abdominal aorta. No lymphadenopathy. Reproductive: Surgically absent as before. Other: No pelvic free fluid. Incidental numerous pelvic phleboliths. Musculoskeletal: Widespread advanced spinal disc and endplate degeneration. Widespread vacuum disc. Widespread lumbar facet degeneration. No acute osseous abnormality identified. IMPRESSION: 1. Acute Diverticulitis involving a 3-4 cm segment of the sigmoid colon. No perforation or abscess. 2. No other acute or inflammatory process identified in the non-contrast abdomen and pelvis. 3. Aortic Atherosclerosis (ICD10-I70.0). Electronically Signed   By:  Genevie Ann M.D.   On: 04/06/2021 10:44    Procedures Procedures   Medications Ordered in ED Medications  sodium chloride 0.9 % bolus 500 mL (500 mLs Intravenous New Bag/Given 04/06/21 0731)  morphine 4 MG/ML injection 4 mg (4 mg Intravenous Given 04/06/21 0741)  ondansetron (ZOFRAN) injection 2 mg (2 mg Intravenous Given 04/06/21 0741)    ED Course  I have reviewed the triage vital signs and the nursing notes.  Pertinent labs & imaging results that were available during my care of the patient were reviewed by me and considered in my medical decision making (see chart for details).    MDM Rules/Calculators/A&P                          MDM  MSE complete  Diana Harper was evaluated in Emergency Department on 04/06/2021 for the symptoms described in the history of present illness. She was evaluated in the context of the global COVID-19 pandemic, which necessitated consideration that the patient might be at risk for infection with the SARS-CoV-2 virus that causes COVID-19. Institutional protocols and algorithms that pertain to the evaluation of patients at risk for COVID-19 are in a state of rapid change based on information released by regulatory bodies including the CDC and federal and state organizations. These policies and algorithms were followed during the patient's care in the ED.  Patient is presenting with complaint of abdominal discomfort.  CT imaging suggests acute diverticulitis.  Patient would likely benefit from admission for pain control and IV antibiotics.  Hospitalist service is aware of case and will evaluate for admission.    Final Clinical Impression(s) / ED Diagnoses Final diagnoses:  Diverticulitis    Rx / DC Orders ED Discharge Orders    None       Valarie Merino,  MD 04/06/21 1159

## 2021-04-06 NOTE — Progress Notes (Signed)
Delay in patient coming up to the unit do to request of full set of VS including temp.

## 2021-04-06 NOTE — Progress Notes (Signed)
Pharmacy Antibiotic Note  Diana Harper is a 85 y.o. female admitted on 04/06/2021 with Acute diverticulitis.  Pharmacy has been consulted for Zosyn dosing.  Plan: Zosyn 3.375g IV q8h (4 hour infusion).  Height: 5\' 3"  (160 cm) Weight: 74.8 kg (164 lb 14.5 oz) IBW/kg (Calculated) : 52.4  Temp (24hrs), Avg:98.8 F (37.1 C), Min:98.8 F (37.1 C), Max:98.9 F (37.2 C)  Recent Labs  Lab 04/06/21 0709  WBC 9.1  CREATININE 1.71*    Estimated Creatinine Clearance: 24.6 mL/min (A) (by C-G formula based on SCr of 1.71 mg/dL (H)).    Allergies  Allergen Reactions  . Latex Anaphylaxis and Shortness Of Breath    : severe respiratory distress  . Onion Diarrhea  . Lisinopril Cough  . Metformin And Related Diarrhea  . Simvastatin Hives and Rash  . Tetracycline Rash  . Zithromax [Azithromycin] Rash    Antimicrobials this admission: 5/9 Zosyn >>   Dose adjustments this admission:   Microbiology results:  Thank you for allowing pharmacy to be a part of this patient's care.  Gretta Arab PharmD, BCPS Clinical Pharmacist [facility] main pharmacy [phone] 04/06/2021 1:30 PM

## 2021-04-06 NOTE — H&P (Signed)
History and Physical    Diana Harper VZC:[id number] DOB: 17-Apr-1936 DOA: 04/06/2021  PCP: [name], MD   Patient coming from: Home  I have personally briefly reviewed patient's old medical records in [place]  Chief Complaint: Abdominal pain  HPI: Diana Harper is a 85 y.o. female with medical history significant for hypothyroidism, diabetes mellitus with complications of chronic kidney disease, hypertension, dyslipidemia, diverticulosis who presents to the emergency room for evaluation of abdominal pain which she has had for about 2 days.  Pain is mostly in the right lower quadrant with radiation to the her back and is associated with nausea and anorexia.  She rates her pain a 7 x 10 in intensity at its worst and describes it as a constant pain She denies having any fever, no chills, no changes in her bowel habits, no urinary symptoms, no chest pain, no shortness of breath, no dizziness, no lightheadedness, no headache, no blurred vision, no palpitations, no diaphoresis. Labs show sodium 138, potassium 4.2, chloride 103, bicarb 26, glucose 225, BUN 36, creatinine 1.71, calcium 10.1, alkaline phosphatase 58, albumin 4.5, lipase 105, AST 35, ALT 35, total protein 8.4, white count 9.1, hemoglobin 12.4, hematocrit 37.7, MCV 89.5, RDW 14.2, platelet count 250 Respiratory viral panel is negative Urine analysis is sterile CT scan of abdomen and pelvis shows acute diverticulitis involving a 3-4 cm segment of the sigmoid colon. No perforation or abscess. No other acute or inflammatory process identified in the non-contrast abdomen and pelvis. Aortic Atherosclerosis   ED Course: Patient is an 85 year old African-American female who presents to the emergency room for evaluation of a 2-day history of abdominal pain mostly in the right lower quadrant associated with nausea.  Imaging shows acute diverticulitis involving the sigmoid colon given the patient has pain on her right side.   Normal appendix noted and decompressed and negative terminal ileum.  She will be admitted to the hospital for IV antibiotics.   Review of Systems: As per HPI otherwise all other systems reviewed and negative.    Past Medical History:  Diagnosis Date  . Anemia   . Anxiety   . Arthritis   . Asthma   . Biliary colic   . Cholelithiasis   . CKD (chronic kidney disease), stage III [facility])    nephrologist--  dr Meredeth Ide  . Colon polyps   . Diverticulosis of colon   . Gastric ulcer   . Glaucoma   . History of asthma    1980's  no longer problem since 1980's  . History of diverticulitis of colon    recurrent--  2014;  2013;  2012  . Hyperlipidemia   . Hypertension   . Hypothyroidism   . Insulin dependent type 2 diabetes mellitus ([place])    followed by dr Jenny Reichmann [facility] ([facility])   . Murmur   . Peripheral neuropathy   . Stroke ([place])   . Vitamin D deficiency   . Wears hearing aid    bilateral  . Wears partial dentures    lower partial and upper full    Past Surgical History:  Procedure Laterality Date  . ABDOMINAL HYSTERECTOMY  1970's  . CARDIOVASCULAR STRESS TEST  07/30/2009   normal exercise lexiscan nuclear study w/ no ischemia/  normal LV funciton and wall motion, ef 77%  . CATARACT EXTRACTION W/ INTRAOCULAR LENS  IMPLANT, BILATERAL  2014  . LAPAROSCOPIC CHOLECYSTECTOMY SINGLE SITE WITH INTRAOPERATIVE CHOLANGIOGRAM N/A 08/25/2016   Procedure: LAPAROSCOPIC CHOLECYSTECTOMY WITH INTRAOPERATIVE CHOLANGIOGRAM;  Surgeon: Lurena Joiner  Sondra Come, MD;  Location: [facility];  Service: General;  Laterality: N/A;  . LUMBAR SPINE SURGERY  2014  . REMOVAL AXILLA CYST Right 1990's     reports that she has never smoked. She has never used smokeless tobacco. She reports that she does not drink alcohol and does not use drugs.  Allergies  Allergen Reactions  . Latex Anaphylaxis and Shortness Of Breath    : severe respiratory distress  . Onion Diarrhea  . Lisinopril Cough  .  Metformin And Related Diarrhea  . Simvastatin Hives and Rash  . Tetracycline Rash  . Zithromax [Azithromycin] Rash    Family History  Problem Relation Age of Onset  . Stroke Mother   . Colon polyps Mother   . Diabetes Mother   . Diabetes Father   . Kidney failure Father   . COPD Father   . Colon polyps Sister   . Diabetes Sister   . Stroke Brother   . Diabetes Brother   . Heart attack Maternal Grandmother   . Heart disease Maternal Grandfather   . Diabetes Paternal Grandmother   . Stroke Paternal Grandfather   . Hypertension Paternal Grandfather   . Diabetes Sister   . Diabetes Sister   . Diabetes Son   . Diabetes Daughter   . Colon cancer Neg Hx   . Esophageal cancer Neg Hx   . Inflammatory bowel disease Neg Hx   . Liver disease Neg Hx   . Pancreatic cancer Neg Hx   . Rectal cancer Neg Hx   . Stomach cancer Neg Hx       Prior to Admission medications   Medication Sig Start Date End Date Taking? Authorizing Provider  acetaminophen (TYLENOL) 500 MG tablet Take 1,000 mg by mouth every 6 (six) hours as needed for mild pain, moderate pain, fever or headache.    [provider]  albuterol (VENTOLIN HFA) 108 (90 Base) MCG/ACT inhaler Inhale 1-2 puffs into the lungs as directed. 02/04/21   [provider]  amLODipine (NORVASC) 10 MG tablet Take 10 mg by mouth daily with breakfast.    [provider]  aspirin 81 MG EC tablet Take by mouth. 10/12/17   [provider]  atorvastatin (LIPITOR) 40 MG tablet Take 1 tablet (40 mg total) daily by mouth. Patient taking differently: Take 20 mg by mouth daily. 10/11/17   Nita Sells, MD  Calcium Carbonate (CALTRATE 600 PO) Take 1 tablet by mouth daily.    [provider]  Cholecalciferol 1000 units capsule Take 5,000 Units by mouth daily.     [provider]  cloNIDine (CATAPRES - DOSED IN MG/24 HR) 0.1 mg/24hr patch 0.1 mg once a week. 01/05/21   [provider]   cyclobenzaprine (FLEXERIL) 10 MG tablet Take 10 mg by mouth 3 (three) times daily as needed for muscle spasms.    [provider]  gabapentin (NEURONTIN) 100 MG capsule Take 100-200 mg at bedtime as needed by mouth (nerve pain).     [provider]  Glucagon HCl 1 MG SOLR Inject as directed as needed.    [provider]  hydrochlorothiazide (HYDRODIURIL) 25 MG tablet Take 25 mg by mouth every morning. 11/26/19   [provider]  hyoscyamine (LEVSIN SL) 0.125 MG SL tablet Place 1 tablet (0.125 mg total) under the tongue every 4 (four) hours as needed. When having large meals 02/04/21   Mansouraty, Telford Nab., MD  Insulin Aspart (NOVOLOG FLEXPEN [place]) Inject 20  Units into the skin daily after supper.    [provider]  insulin degludec (TRESIBA) 200 UNIT/ML FlexTouch Pen Inject 90 Units into the skin daily with supper.     [provider]  levothyroxine (SYNTHROID) 25 MCG tablet Take 25 mcg by mouth every morning. 11/10/20   [provider]  loratadine (CLARITIN) 10 MG tablet Take 10 mg daily as needed by mouth (seasonal allergies).     [provider]  losartan (COZAAR) 100 MG tablet Take 25 mg by mouth daily. 11/26/19   [provider]  nitroGLYCERIN (NITROSTAT) 0.4 MG SL tablet Place 1 tablet (0.4 mg total) under the tongue every 5 (five) minutes as needed for chest pain. 01/20/21 04/20/21  Cantwell, Celeste C, PA-C  omeprazole (PRILOSEC) 40 MG capsule Take 1 capsule (40 mg total) by mouth 2 (two) times daily. 03/05/21   Mansouraty, Telford Nab., MD  sertraline (ZOLOFT) 50 MG tablet Take 150 mg by mouth daily.     [provider]  traMADol (ULTRAM) 50 MG tablet Take 50 mg by mouth as needed.    [provider]    Physical Exam: Vitals:   04/06/21 0648 04/06/21 0650 04/06/21 0817 04/06/21 1046  BP: (!) 156/68  (!) 148/74 135/62  Pulse: 90  87 85  Resp: 18  14 16   Temp: 98.9 F (37.2 C)  98.8 F (37.1  C) 98.8 F (37.1 C)  TempSrc: Oral  Oral   SpO2: 97%  98% 98%  Weight:  74.8 kg    Height:  5\' 3"  (1.6 m)       Vitals:   04/06/21 0648 04/06/21 0650 04/06/21 0817 04/06/21 1046  BP: (!) 156/68  (!) 148/74 135/62  Pulse: 90  87 85  Resp: 18  14 16   Temp: 98.9 F (37.2 C)  98.8 F (37.1 C) 98.8 F (37.1 C)  TempSrc: Oral  Oral   SpO2: 97%  98% 98%  Weight:  74.8 kg    Height:  5\' 3"  (1.6 m)        Constitutional: Alert and oriented x 3.  Appears uncomfortable HEENT:      Head: Normocephalic and atraumatic.         Eyes: PERLA, EOMI, Conjunctivae are normal. Sclera is non-icteric.       Mouth/Throat: Mucous membranes are moist.       Neck: Supple with no signs of meningismus. Cardiovascular: Regular rate and rhythm. No murmurs, gallops, or rubs. 2+ symmetrical distal pulses are present . No JVD. No LE edema Respiratory: Respiratory effort normal .Lungs sounds clear bilaterally. No wheezes, crackles, or rhonchi.  Gastrointestinal: Soft, bilateral lower quadrant tenderness (Rt > Lt) , and non distended with positive bowel sounds.  Genitourinary: No CVA tenderness. Musculoskeletal: Nontender with normal range of motion in all extremities. No cyanosis, or erythema of extremities. Neurologic:  Face is symmetric. Moving all extremities. No gross focal neurologic deficits  Skin: Skin is warm, dry.  No rash or ulcers Psychiatric: Mood and affect are normal   Labs on Admission: I have personally reviewed following labs and imaging studies  CBC: Recent Labs  Lab 04/06/21 0709  WBC 9.1  NEUTROABS 6.5  HGB 12.4  HCT 37.7  MCV 89.5  PLT 161   Basic Metabolic Panel: Recent Labs  Lab 04/06/21 0709  NA 138  K 4.2  CL 103  CO2 26  GLUCOSE 225*  BUN 36*  CREATININE 1.71*  CALCIUM 10.1   GFR: Estimated Creatinine Clearance:  24.6 mL/min (A) (by C-G formula based on SCr of 1.71 mg/dL (H)). Liver Function Tests: Recent Labs  Lab 04/06/21 0709  AST 35  ALT 35   ALKPHOS 58  BILITOT 0.6  PROT 8.4*  ALBUMIN 4.5   Recent Labs  Lab 04/06/21 0709  LIPASE 105*   No results for input(s): AMMONIA in the last 168 hours. Coagulation Profile: No results for input(s): INR, PROTIME in the last 168 hours. Cardiac Enzymes: No results for input(s): CKTOTAL, CKMB, CKMBINDEX, TROPONINI in the last 168 hours. BNP (last 3 results) No results for input(s): PROBNP in the last 8760 hours. HbA1C: No results for input(s): HGBA1C in the last 72 hours. CBG: Recent Labs  Lab 04/06/21 0715  GLUCAP 215*   Lipid Profile: No results for input(s): CHOL, HDL, LDLCALC, TRIG, CHOLHDL, LDLDIRECT in the last 72 hours. Thyroid Function Tests: No results for input(s): TSH, T4TOTAL, FREET4, T3FREE, THYROIDAB in the last 72 hours. Anemia Panel: No results for input(s): VITAMINB12, FOLATE, FERRITIN, TIBC, IRON, RETICCTPCT in the last 72 hours. Urine analysis:    Component Value Date/Time   COLORURINE YELLOW 04/06/2021 1026   APPEARANCEUR CLEAR 04/06/2021 1026   LABSPEC 1.012 04/06/2021 1026   PHURINE 5.0 04/06/2021 1026   GLUCOSEU 50 (A) 04/06/2021 1026   HGBUR SMALL (A) 04/06/2021 1026   BILIRUBINUR NEGATIVE 04/06/2021 1026   KETONESUR NEGATIVE 04/06/2021 1026   PROTEINUR 30 (A) 04/06/2021 1026   UROBILINOGEN 0.2 04/15/2012 1217   NITRITE NEGATIVE 04/06/2021 1026   LEUKOCYTESUR SMALL (A) 04/06/2021 1026    Radiological Exams on Admission: CT ABDOMEN PELVIS WO CONTRAST  Result Date: 04/06/2021 CLINICAL DATA:  85 year old female with right flank pain since yesterday. EXAM: CT ABDOMEN AND PELVIS WITHOUT CONTRAST TECHNIQUE: Multidetector CT imaging of the abdomen and pelvis was performed following the standard protocol without IV contrast. COMPARISON:  CT Abdomen and Pelvis 08/19/2020 and earlier. FINDINGS: Lower chest: Stable since last year with mild linear scarring and atelectasis in the lingula, and mildly involving the right minor fissure. No pericardial or  pleural effusion. Hepatobiliary: Chronically absent gallbladder. Stable noncontrast liver. Pancreas: Negative. Spleen: Negative. Adrenals/Urinary Tract: Normal adrenal glands. Stable and negative noncontrast kidneys. Decompressed ureters. Urinary bladder remains normal. Stomach/Bowel: Negative rectum. Diverticulosis throughout the sigmoid colon with active inflammation involving a 3-4 cm segment of proximal to mid sigmoid seen on series 2 images 64 and 65. Underlying wall thickening. No extraluminal gas. Extensive diverticulosis continues in the descending colon, and moderately affects the transverse and right colon. No other active inflammation identified. Normal appendix in the right hemipelvis on coronal image 94. Decompressed and negative terminal ileum. No dilated or abnormal small bowel. Negative stomach and duodenum. No free fluid. Vascular/Lymphatic: Aortoiliac calcified atherosclerosis. Normal caliber abdominal aorta. No lymphadenopathy. Reproductive: Surgically absent as before. Other: No pelvic free fluid. Incidental numerous pelvic phleboliths. Musculoskeletal: Widespread advanced spinal disc and endplate degeneration. Widespread vacuum disc. Widespread lumbar facet degeneration. No acute osseous abnormality identified. IMPRESSION: 1. Acute Diverticulitis involving a 3-4 cm segment of the sigmoid colon. No perforation or abscess. 2. No other acute or inflammatory process identified in the non-contrast abdomen and pelvis. 3. Aortic Atherosclerosis (ICD10-I70.0). Electronically Signed   By: Genevie Ann M.D.   On: 04/06/2021 10:44     Assessment/Plan Principal Problem:   Acute diverticulitis Active Problems:   CVA (cerebral vascular accident) ([place])   CKD stage 3 due to type 2 diabetes mellitus ([place])   Essential hypertension   Hypothyroidism   Depression  COPD (chronic obstructive pulmonary disease) ([facility])    Acute diverticulitis We will keep patient n.p.o. for now IV antibiotic therapy with  Zosyn Pain control      Diabetes mellitus with complications of stage III chronic kidney disease Renal function is stable and at baseline We will place patient on sliding scale coverage with Accu-Cheks every 4 hours while NPO    Hypothyroidism Continue Synthroid    Hypertension Continue hydrochlorothiazide, amlodipine, clonidine and Cozaar    Depression Continue sertraline    History of COPD Not acutely exacerbated Continue as needed bronchodilator therapy as well as inhaled steroids.   DVT prophylaxis: Lovenox Code Status: full code Family Communication: Greater than 50% of time was spent discussing patient's condition with her and her daughter at the bedside.  All questions and concerns have been addressed.  They verbalized understanding and agree with the plan. Disposition Plan: Back to previous home environment Consults called: none Status: At the time of admission, it appears that the appropriate admission status for this patient is inpatient. This is judged to be reasonable and necessary in order to provide the required intensity of service to ensure the patient's safety given the presenting symptoms, physical exam findings and initial radiographic and laboratory data in the context of their comorbid conditions. Patient requires inpatient status due to high intensity of service, high risk of further deterioration and high frequency of surveillance required.    Breona Cherubin MD [facility]     04/06/2021, 1:25 PM

## 2021-04-06 NOTE — ED Triage Notes (Signed)
Pt c/o right flank pain since yesterday. Reports a hx of right kidney issues and recurrent diverticulitis.

## 2021-04-07 ENCOUNTER — Inpatient Hospital Stay (HOSPITAL_COMMUNITY): Payer: Medicare Other

## 2021-04-07 ENCOUNTER — Encounter (HOSPITAL_COMMUNITY): Payer: Self-pay | Admitting: Internal Medicine

## 2021-04-07 DIAGNOSIS — N179 Acute kidney failure, unspecified: Secondary | ICD-10-CM

## 2021-04-07 DIAGNOSIS — E1122 Type 2 diabetes mellitus with diabetic chronic kidney disease: Secondary | ICD-10-CM

## 2021-04-07 DIAGNOSIS — N183 Chronic kidney disease, stage 3 unspecified: Secondary | ICD-10-CM

## 2021-04-07 LAB — CBC
HCT: 30.6 % — ABNORMAL LOW (ref 36.0–46.0)
Hemoglobin: 9.9 g/dL — ABNORMAL LOW (ref 12.0–15.0)
MCH: 29.9 pg (ref 26.0–34.0)
MCHC: 32.4 g/dL (ref 30.0–36.0)
MCV: 92.4 fL (ref 80.0–100.0)
Platelets: 199 10*3/uL (ref 150–400)
RBC: 3.31 MIL/uL — ABNORMAL LOW (ref 3.87–5.11)
RDW: 14.3 % (ref 11.5–15.5)
WBC: 7.5 10*3/uL (ref 4.0–10.5)
nRBC: 0 % (ref 0.0–0.2)

## 2021-04-07 LAB — GLUCOSE, CAPILLARY
Glucose-Capillary: 111 mg/dL — ABNORMAL HIGH (ref 70–99)
Glucose-Capillary: 132 mg/dL — ABNORMAL HIGH (ref 70–99)
Glucose-Capillary: 134 mg/dL — ABNORMAL HIGH (ref 70–99)
Glucose-Capillary: 169 mg/dL — ABNORMAL HIGH (ref 70–99)
Glucose-Capillary: 212 mg/dL — ABNORMAL HIGH (ref 70–99)
Glucose-Capillary: 215 mg/dL — ABNORMAL HIGH (ref 70–99)
Glucose-Capillary: 251 mg/dL — ABNORMAL HIGH (ref 70–99)

## 2021-04-07 LAB — BASIC METABOLIC PANEL
Anion gap: 9 (ref 5–15)
BUN: 42 mg/dL — ABNORMAL HIGH (ref 8–23)
CO2: 24 mmol/L (ref 22–32)
Calcium: 8.8 mg/dL — ABNORMAL LOW (ref 8.9–10.3)
Chloride: 105 mmol/L (ref 98–111)
Creatinine, Ser: 3.25 mg/dL — ABNORMAL HIGH (ref 0.44–1.00)
GFR, Estimated: 14 mL/min — ABNORMAL LOW (ref >60)
Glucose, Bld: 157 mg/dL — ABNORMAL HIGH (ref 70–99)
Potassium: 4.3 mmol/L (ref 3.5–5.1)
Sodium: 138 mmol/L (ref 135–145)

## 2021-04-07 MED ORDER — LACTATED RINGERS IV SOLN: INTRAVENOUS | Status: DC

## 2021-04-07 MED ORDER — PIPERACILLIN-TAZOBACTAM IN DEX 2-0.25 GM/50ML IV SOLN
2.2500 g | Freq: Three times a day (TID) | INTRAVENOUS | Status: DC
  Administered 2021-04-07 – 2021-04-08 (×3): 2.25 g via INTRAVENOUS
  Filled 2021-04-07 (×3): qty 50

## 2021-04-07 MED ORDER — CHLORHEXIDINE GLUCONATE CLOTH 2 % EX PADS
6.0000 | MEDICATED_PAD | Freq: Every day | CUTANEOUS | Status: DC
  Administered 2021-04-07 – 2021-04-08 (×2): 6 via TOPICAL

## 2021-04-07 NOTE — Progress Notes (Addendum)
Progress Note    Diana Harper  PNT:[id number] DOB: [birth year]  DOA: 04/06/2021 PCP: [name], MD      Brief Narrative:    Medical records reviewed and are as summarized below:  Diana Harper is a 85 y.o. female with medical history significant for diabetes mellitus, CKD stage IIIa, hypothyroidism, hypertension, dyslipidemia, depression, diverticulosis, history of diverticulitis x3, history of defecation syncope, history of stroke who presented to the hospital with abdominal pain and nausea.  She was found to have acute diverticulitis and acute kidney injury.  She was treated with empiric IV antibiotics and IV fluids.    Assessment/Plan:   Principal Problem:   Acute diverticulitis Active Problems:   CVA (cerebral vascular accident) ([place])   AKI (acute kidney injury) ([place])   CKD stage 3 due to type 2 diabetes mellitus ([place])   Essential hypertension   Hypothyroidism   Depression   COPD (chronic obstructive pulmonary disease) ([facility])   Body mass index is 29.21 kg/m.    Acute diverticulitis: Start clear liquid diet.  Continue empiric IV antibiotics and analgesics as needed for pain.  AKI on CKD stage IIIa: Creatinine has gotten worse.  Patient had no urine output since "early yesterday" so Foley catheter was placed this morning.  Renal ultrasound was done today did not show any evidence of obstruction or hydronephrosis.  There is evidence of medical renal disease.  Continue IV fluids for hydration.  Monitor BMP.  Discontinue HCTZ and losartan.  Hypertension: Continue amlodipine  COPD: stable.  Continue bronchodilators.  Other comorbidities include diabetes mellitus, history of stroke, depression  Diet Order            Diet clear liquid Room service appropriate? Yes; Fluid consistency: Thin  Diet effective now                    Consultants:  None  Procedures:  None    Medications:   . amLODipine  10 mg Oral Q breakfast  . aspirin EC   81 mg Oral Daily  . calcium carbonate  1 tablet Oral Q breakfast  . Chlorhexidine Gluconate Cloth  6 each Topical Daily  . cholecalciferol  1,000 Units Oral Daily  . [START ON 04/09/2021] cloNIDine  0.2 mg Transdermal Weekly  . enoxaparin (LOVENOX) injection  30 mg Subcutaneous Q24H  . ferrous sulfate  325 mg Oral Q breakfast  . insulin aspart  0-15 Units Subcutaneous Q4H  . levothyroxine  25 mcg Oral Q0600  . mometasone-formoterol  2 puff Inhalation BID  . pantoprazole (PROTONIX) IV  40 mg Intravenous Q24H  . sertraline  150 mg Oral QHS   Continuous Infusions: . lactated ringers 100 mL/hr at 04/07/21 0851  . piperacillin-tazobactam (ZOSYN)  IV 2.25 g (04/07/21 1213)     Anti-infectives (From admission, onward)   Start     Dose/Rate Route Frequency Ordered Stop   04/07/21 1400  piperacillin-tazobactam (ZOSYN) IVPB 2.25 g        2.25 g 100 mL/hr over 30 Minutes Intravenous Every 8 hours 04/07/21 0734     04/06/21 1600  piperacillin-tazobactam (ZOSYN) IVPB 3.375 g  Status:  Discontinued        3.375 g 12.5 mL/hr over 240 Minutes Intravenous Every 8 hours 04/06/21 1332 04/07/21 0734   04/06/21 1200  piperacillin-tazobactam (ZOSYN) IVPB 3.375 g        3.375 g 100 mL/hr over 30 Minutes Intravenous  Once 04/06/21 1147 04/06/21 1300   04/06/21  1200  metroNIDAZOLE (FLAGYL) IVPB 500 mg        500 mg 100 mL/hr over 60 Minutes Intravenous  Once 04/06/21 1147 04/06/21 1400             Family Communication/Anticipated D/C date and plan/Code Status   DVT prophylaxis: enoxaparin (LOVENOX) injection 30 mg Start: 04/06/21 1800     Code Status: Full Code  Family Communication: None Disposition Plan:    Status is: Inpatient  Remains inpatient appropriate because:IV treatments appropriate due to intensity of illness or inability to take PO   Dispo: The patient is from: Home              Anticipated d/c is to: Home              Patient currently is not medically stable to  d/c.   Difficult to place patient No           Subjective:   Abdominal pain is better 2/10. No urine output since "early yesterday". 4th episode of diverticulitis. Last one was about a year ago. Vagal episodes from defecation. Last episode about a year ago  Objective:    Vitals:   04/07/21 0300 04/07/21 0432 04/07/21 0757 04/07/21 1350  BP: (!) 114/52 (!) 109/54  119/87  Pulse: 64 80  80  Resp: 16 18  16   Temp: 99.6 F (37.6 C) 99.5 F (37.5 C)  97.7 F (36.5 C)  TempSrc:  Oral  Oral  SpO2: 96% 97% 95% 97%  Weight:      Height:       No data found.   Intake/Output Summary (Last 24 hours) at 04/07/2021 1559 Last data filed at 04/07/2021 1500 Gross per 24 hour  Intake 1848.5 ml  Output 1050 ml  Net 798.5 ml   Filed Weights   04/06/21 0650  Weight: 74.8 kg    Exam:  GEN: NAD SKIN: Warm and dry EYES: EOMI ENT: MMM CV: RRR PULM: CTA B ABD: soft, ND, mild RLQ tenderness, +BS CNS: AAO x 3, non focal EXT: No edema or tenderness        Data Reviewed:   I have personally reviewed following labs and imaging studies:  Labs: Labs show the following:   Basic Metabolic Panel: Recent Labs  Lab 04/06/21 0709 04/07/21 0531  NA 138 138  K 4.2 4.3  CL 103 105  CO2 26 24  GLUCOSE 225* 157*  BUN 36* 42*  CREATININE 1.71* 3.25*  CALCIUM 10.1 8.8*   GFR Estimated Creatinine Clearance: 12.9 mL/min (A) (by C-G formula based on SCr of 3.25 mg/dL (H)). Liver Function Tests: Recent Labs  Lab 04/06/21 0709  AST 35  ALT 35  ALKPHOS 58  BILITOT 0.6  PROT 8.4*  ALBUMIN 4.5   Recent Labs  Lab 04/06/21 0709  LIPASE 105*   No results for input(s): AMMONIA in the last 168 hours. Coagulation profile No results for input(s): INR, PROTIME in the last 168 hours.  CBC: Recent Labs  Lab 04/06/21 0709 04/07/21 0531  WBC 9.1 7.5  NEUTROABS 6.5  --   HGB 12.4 9.9*  HCT 37.7 30.6*  MCV 89.5 92.4  PLT 250 199   Cardiac Enzymes: No results for  input(s): CKTOTAL, CKMB, CKMBINDEX, TROPONINI in the last 168 hours. BNP (last 3 results) No results for input(s): PROBNP in the last 8760 hours. CBG: Recent Labs  Lab 04/06/21 2023 04/07/21 0011 04/07/21 0434 04/07/21 0751 04/07/21 1142  GLUCAP 148* 212* 169* 134*  132*   D-Dimer: No results for input(s): DDIMER in the last 72 hours. Hgb A1c: Recent Labs    04/06/21 0709  HGBA1C 7.8*   Lipid Profile: No results for input(s): CHOL, HDL, LDLCALC, TRIG, CHOLHDL, LDLDIRECT in the last 72 hours. Thyroid function studies: No results for input(s): TSH, T4TOTAL, T3FREE, THYROIDAB in the last 72 hours.  Invalid input(s): FREET3 Anemia work up: No results for input(s): VITAMINB12, FOLATE, FERRITIN, TIBC, IRON, RETICCTPCT in the last 72 hours. Sepsis Labs: Recent Labs  Lab 04/06/21 0709 04/07/21 0531  WBC 9.1 7.5    Microbiology Recent Results (from the past 240 hour(s))  Resp Panel by RT-PCR (Flu A&B, Covid) Nasopharyngeal Swab     Status: None   Collection Time: 04/06/21  7:30 AM   Specimen: Nasopharyngeal Swab; Nasopharyngeal(NP) swabs in vial transport medium  Result Value Ref Range Status   SARS Coronavirus 2 by RT PCR NEGATIVE NEGATIVE Final    Comment: (NOTE) SARS-CoV-2 target nucleic acids are NOT DETECTED.  The SARS-CoV-2 RNA is generally detectable in upper respiratory specimens during the acute phase of infection. The lowest concentration of SARS-CoV-2 viral copies this assay can detect is 138 copies/mL. A negative result does not preclude SARS-Cov-2 infection and should not be used as the sole basis for treatment or other patient management decisions. A negative result may occur with  improper specimen collection/handling, submission of specimen other than nasopharyngeal swab, presence of viral mutation(s) within the areas targeted by this assay, and inadequate number of viral copies(<138 copies/mL). A negative result must be combined with clinical  observations, patient history, and epidemiological information. The expected result is Negative.  Fact Sheet for Patients:  EntrepreneurPulse.com.au  Fact Sheet for Healthcare Providers:  IncredibleEmployment.be  This test is no t yet approved or cleared by the [place] FDA and  has been authorized for detection and/or diagnosis of SARS-CoV-2 by FDA under an Emergency Use Authorization (EUA). This EUA will remain  in effect (meaning this test can be used) for the duration of the COVID-19 declaration under Section 564(b)(1) of the Act, 21 U.S.C.section 360bbb-3(b)(1), unless the authorization is terminated  or revoked sooner.       Influenza A by PCR NEGATIVE NEGATIVE Final   Influenza B by PCR NEGATIVE NEGATIVE Final    Comment: (NOTE) The Xpert Xpress SARS-CoV-2/FLU/RSV plus assay is intended as an aid in the diagnosis of influenza from Nasopharyngeal swab specimens and should not be used as a sole basis for treatment. Nasal washings and aspirates are unacceptable for Xpert Xpress SARS-CoV-2/FLU/RSV testing.  Fact Sheet for Patients: EntrepreneurPulse.com.au  Fact Sheet for Healthcare Providers: IncredibleEmployment.be  This test is not yet approved or cleared by the [place] FDA and has been authorized for detection and/or diagnosis of SARS-CoV-2 by FDA under an Emergency Use Authorization (EUA). This EUA will remain in effect (meaning this test can be used) for the duration of the COVID-19 declaration under Section 564(b)(1) of the Act, 21 U.S.C. section 360bbb-3(b)(1), unless the authorization is terminated or revoked.  Performed at [facility], [place] [address], [place], [place] [address]     Procedures and diagnostic studies:  CT ABDOMEN PELVIS WO CONTRAST  Result Date: 04/06/2021 CLINICAL DATA:  85 year old female with right flank pain since yesterday. EXAM: CT  ABDOMEN AND PELVIS WITHOUT CONTRAST TECHNIQUE: Multidetector CT imaging of the abdomen and pelvis was performed following the standard protocol without IV contrast. COMPARISON:  CT Abdomen and Pelvis 08/19/2020 and earlier. FINDINGS: Lower chest:  Stable since last year with mild linear scarring and atelectasis in the lingula, and mildly involving the right minor fissure. No pericardial or pleural effusion. Hepatobiliary: Chronically absent gallbladder. Stable noncontrast liver. Pancreas: Negative. Spleen: Negative. Adrenals/Urinary Tract: Normal adrenal glands. Stable and negative noncontrast kidneys. Decompressed ureters. Urinary bladder remains normal. Stomach/Bowel: Negative rectum. Diverticulosis throughout the sigmoid colon with active inflammation involving a 3-4 cm segment of proximal to mid sigmoid seen on series 2 images 64 and 65. Underlying wall thickening. No extraluminal gas. Extensive diverticulosis continues in the descending colon, and moderately affects the transverse and right colon. No other active inflammation identified. Normal appendix in the right hemipelvis on coronal image 94. Decompressed and negative terminal ileum. No dilated or abnormal small bowel. Negative stomach and duodenum. No free fluid. Vascular/Lymphatic: Aortoiliac calcified atherosclerosis. Normal caliber abdominal aorta. No lymphadenopathy. Reproductive: Surgically absent as before. Other: No pelvic free fluid. Incidental numerous pelvic phleboliths. Musculoskeletal: Widespread advanced spinal disc and endplate degeneration. Widespread vacuum disc. Widespread lumbar facet degeneration. No acute osseous abnormality identified. IMPRESSION: 1. Acute Diverticulitis involving a 3-4 cm segment of the sigmoid colon. No perforation or abscess. 2. No other acute or inflammatory process identified in the non-contrast abdomen and pelvis. 3. Aortic Atherosclerosis (ICD10-I70.0). Electronically Signed   By: Genevie Ann M.D.   On: 04/06/2021  10:44   US RENAL  Result Date: 04/07/2021 CLINICAL DATA:  Acute kidney injury, history hypertension, stage III chronic kidney disease, type II diabetes mellitus EXAM: RENAL / URINARY TRACT ULTRASOUND COMPLETE COMPARISON:  Renal ultrasound 06/28/2016 FINDINGS: Right Kidney: Renal measurements: 8.5 x 4.5 x 4.2 cm = volume: 84 mL. Cortical thinning. Normal cortical echogenicity. No mass, hydronephrosis, or shadowing calcification. Left Kidney: Renal measurements: 8.3 x 4.7 x 4.3 cm = volume: 88 mL. Cortical thinning. Normal cortical echogenicity. No mass, hydronephrosis, or shadowing calcification. Bladder: Appears normal for degree of bladder distention. Other: N/A IMPRESSION: Age-related renal cortical thinning. No evidence of renal mass or hydronephrosis. Electronically Signed   By: Lavonia Dana M.D.   On: 04/07/2021 11:26               LOS: 1 day   Kristofer Schaffert  [facility]   Pager on [email or web address]. If 7PM-7AM, please contact night-coverage at [email or web address]     04/07/2021, 3:59 PM

## 2021-04-07 NOTE — Progress Notes (Signed)
Pharmacy Antibiotic Note  Diana Harper is a 85 y.o. female admitted on 04/06/2021 with Acute diverticulitis.  Pharmacy has been consulted for Zosyn dosing. 04/07/2021  AF, WBC WNL, SCr  1.71>>3.25; CrCl 12.9 ml/min  Plan: Change Zosyn to 2.25 gm IV q8hr F/u renal function  Height: 5\' 3"  (160 cm) Weight: 74.8 kg (164 lb 14.5 oz) IBW/kg (Calculated) : 52.4  Temp (24hrs), Avg:99.2 F (37.3 C), Min:98.8 F (37.1 C), Max:99.6 F (37.6 C)  Recent Labs  Lab 04/06/21 0709 04/07/21 0531  WBC 9.1 7.5  CREATININE 1.71* 3.25*    Estimated Creatinine Clearance: 12.9 mL/min (A) (by C-G formula based on SCr of 3.25 mg/dL (H)).    Allergies  Allergen Reactions  . Latex Anaphylaxis and Shortness Of Breath    : severe respiratory distress  . Onion Diarrhea  . Lisinopril Cough  . Metformin And Related Diarrhea  . Simvastatin Hives and Rash  . Tetracycline Rash  . Zithromax [Azithromycin] Rash    Antimicrobials this admission: 5/9 Zosyn >>  Dose adjustments this admission: 5/10 ZEI> 2.25 q8 Microbiology results:  Thank you for allowing pharmacy to be a part of this patient's care.  Eudelia Bunch, Pharm.D 04/07/2021 7:37 AM Clinical Pharmacist [facility] main pharmacy [phone] 04/07/2021 7:35 AM

## 2021-04-07 NOTE — Progress Notes (Signed)
Foley catheter inserted this day. Good urine output from foley cath.

## 2021-04-08 LAB — CBC WITH DIFFERENTIAL/PLATELET
Abs Immature Granulocytes: 0.01 10*3/uL (ref 0.00–0.07)
Basophils Absolute: 0 10*3/uL (ref 0.0–0.1)
Basophils Relative: 1 %
Eosinophils Absolute: 0.2 10*3/uL (ref 0.0–0.5)
Eosinophils Relative: 3 %
HCT: 32 % — ABNORMAL LOW (ref 36.0–46.0)
Hemoglobin: 10.5 g/dL — ABNORMAL LOW (ref 12.0–15.0)
Immature Granulocytes: 0 %
Lymphocytes Relative: 26 %
Lymphs Abs: 1.5 10*3/uL (ref 0.7–4.0)
MCH: 29.7 pg (ref 26.0–34.0)
MCHC: 32.8 g/dL (ref 30.0–36.0)
MCV: 90.7 fL (ref 80.0–100.0)
Monocytes Absolute: 0.5 10*3/uL (ref 0.1–1.0)
Monocytes Relative: 8 %
Neutro Abs: 3.8 10*3/uL (ref 1.7–7.7)
Neutrophils Relative %: 62 %
Platelets: 202 10*3/uL (ref 150–400)
RBC: 3.53 MIL/uL — ABNORMAL LOW (ref 3.87–5.11)
RDW: 14.4 % (ref 11.5–15.5)
WBC: 6 10*3/uL (ref 4.0–10.5)
nRBC: 0 % (ref 0.0–0.2)

## 2021-04-08 LAB — GLUCOSE, CAPILLARY
Glucose-Capillary: 107 mg/dL — ABNORMAL HIGH (ref 70–99)
Glucose-Capillary: 126 mg/dL — ABNORMAL HIGH (ref 70–99)
Glucose-Capillary: 162 mg/dL — ABNORMAL HIGH (ref 70–99)
Glucose-Capillary: 173 mg/dL — ABNORMAL HIGH (ref 70–99)
Glucose-Capillary: 236 mg/dL — ABNORMAL HIGH (ref 70–99)
Glucose-Capillary: 317 mg/dL — ABNORMAL HIGH (ref 70–99)

## 2021-04-08 LAB — RENAL FUNCTION PANEL
Albumin: 3.4 g/dL — ABNORMAL LOW (ref 3.5–5.0)
Anion gap: 6 (ref 5–15)
BUN: 41 mg/dL — ABNORMAL HIGH (ref 8–23)
CO2: 27 mmol/L (ref 22–32)
Calcium: 9 mg/dL (ref 8.9–10.3)
Chloride: 108 mmol/L (ref 98–111)
Creatinine, Ser: 2.71 mg/dL — ABNORMAL HIGH (ref 0.44–1.00)
GFR, Estimated: 17 mL/min — ABNORMAL LOW (ref >60)
Glucose, Bld: 120 mg/dL — ABNORMAL HIGH (ref 70–99)
Phosphorus: 3.5 mg/dL (ref 2.5–4.6)
Potassium: 4.2 mmol/L (ref 3.5–5.1)
Sodium: 141 mmol/L (ref 135–145)

## 2021-04-08 MED ORDER — BOOST / RESOURCE BREEZE PO LIQD CUSTOM
1.0000 | Freq: Three times a day (TID) | ORAL | Status: DC
  Administered 2021-04-08 – 2021-04-09 (×3): 1 via ORAL

## 2021-04-08 MED ORDER — PANTOPRAZOLE SODIUM 40 MG PO TBEC
40.0000 mg | DELAYED_RELEASE_TABLET | Freq: Every day | ORAL | Status: DC
  Administered 2021-04-08 – 2021-04-09 (×2): 40 mg via ORAL
  Filled 2021-04-08 (×2): qty 1

## 2021-04-08 MED ORDER — SODIUM CHLORIDE 0.9 % IV SOLN
2.0000 g | INTRAVENOUS | Status: DC
  Administered 2021-04-08 – 2021-04-09 (×2): 2 g via INTRAVENOUS
  Filled 2021-04-08 (×2): qty 2

## 2021-04-08 NOTE — Discharge Instructions (Signed)

## 2021-04-08 NOTE — Progress Notes (Signed)

## 2021-04-08 NOTE — Progress Notes (Signed)
Initial Nutrition Assessment  INTERVENTION:   -Will provide diet education for diverticulitis prior to discharge  -Boost Breeze po TID, each supplement provides 250 kcal and 9 grams of protein  NUTRITION DIAGNOSIS:   Increased nutrient needs related to acute illness as evidenced by estimated needs.  GOAL:   Patient will meet greater than or equal to 90% of their needs  MONITOR:   PO intake,Supplement acceptance,Labs,Weight trends,I & O's  REASON FOR ASSESSMENT:   Consult Diet education  ASSESSMENT:   85 y.o. female with medical history significant for diabetes mellitus, CKD stage IIIa, hypothyroidism, hypertension, dyslipidemia, depression, diverticulosis, history of diverticulitis x3, history of defecation syncope, history of stroke who presented to the hospital with abdominal pain and nausea.     She was found to have acute diverticulitis and acute kidney injury.   Attempted to provide diet education. Pt reports she is currently awaiting staff to help her. Seemed uncomfortable in bed.  RD left handout in room to review later with patient. Per chart review, no plans for discharge noted for today.  Diet was advanced to soft today. Will order Boost Breeze for additional kcals and protein.  Per weight records, pt with minimal weight loss since February 2022.  Medications: OSCAL, Vitamin D, Ferrous sulfate  Labs reviewed: CBGs: 107-251  NUTRITION - FOCUSED PHYSICAL EXAM:  Unable to complete  Diet Order:   Diet Order            DIET SOFT Room service appropriate? Yes; Fluid consistency: Thin  Diet effective now                 EDUCATION NEEDS:   Not appropriate for education at this time  Skin:  Skin Assessment: Reviewed RN Assessment  Last BM:  5/9 -type 6  Height:   Ht Readings from Last 1 Encounters:  04/06/21 5\' 3"  (1.6 m)    Weight:   Wt Readings from Last 1 Encounters:  04/06/21 74.8 kg    BMI:  Body mass index is 29.21 kg/m.  Estimated  Nutritional Needs:   Kcal:  1700-1900  Protein:  85-100g  Fluid:  1.9L/day  Clayton Bibles, MS, RD, LDN Inpatient Clinical Dietitian Contact information available via Amion

## 2021-04-08 NOTE — Progress Notes (Signed)
[facility] PROGRESS NOTE    Diana Harper  IRC:[id number] DOB: 25-May-1936 DOA: 04/06/2021 PCP: [name], MD      Brief Narrative:  Diana Harper is a 85 y.o. F with hx DM, CKD, hypothyroidism, HTN and recurrent diverticulitis and depression who presented with abdominal pain and nausea.    Found to have acute diverticulitis and AKI  Started on antibiotics and fluids.         Assessment & Plan:  Diverticulitis This is resolving -Transition to Rocephin, day 3 of antibiotics   AKI on CKD stage IIIb Cr baseline 1.5-1.9, was up to 3.2 on admission, improving with fluids -Stop fluids, push oral fluids -Trend Cr  Hypertension Blood pressure controlled - Continue amlodipine, clonidine  Diabetes Glucoses controlled - Continue sliding scale corrections  Hypothyroidism -Continue levothyroxine  COPD - No acute flare - Continue ICS/LABA           Disposition: Status is: Inpatient  Remains inpatient appropriate because:severe organ injury, only now starting to improve   Dispo: The patient is from: Home              Anticipated d/c is to: Home              Patient currently is not medically stable to d/c.   Difficult to place patient No       Level of care: Med-Surg       MDM: The below labs and imaging reports were reviewed and summarized above.  Medication management as above.    DVT prophylaxis: enoxaparin (LOVENOX) injection 30 mg Start: 04/06/21 1800  Code Status: FULL Family Communication:  Called to daughter, no answer, will call again tomorrow           Subjective: , Fever, chest discomfort, respiratory distress; abdominal pain is improving.  No vomiting.  Urine output.  Objective: Vitals:   04/07/21 1947 04/08/21 0339 04/08/21 0812 04/08/21 1313  BP: (!) 126/51 129/68  (!) 126/53  Pulse: 78 84  70  Resp: 17 17  16   Temp: 98.5 F (36.9 C) 98.4 F (36.9 C)  98.3 F (36.8 C)  TempSrc: Oral  Oral  Oral  SpO2: 95% 93% 91% 96%  Weight:      Height:        Intake/Output Summary (Last 24 hours) at 04/08/2021 1654 Last data filed at 04/08/2021 1500 Gross per 24 hour  Intake 340 ml  Output 2050 ml  Net -1710 ml   Filed Weights   04/06/21 0650  Weight: 74.8 kg    Examination: General appearance:  adult female, alert and in no distress.   HEENT: Anicteric, conjunctiva pink, lids and lashes normal. No nasal deformity, discharge, epistaxis.  Lips moist.   Skin: Warm and dry.  no jaundice.  No suspicious rashes or lesions. Cardiac: RRR, nl S1-S2, no murmurs appreciated.  Capillary refill is brisk.  JVP normal.  No LE edema.  Radial pulses 2+ and symmetric. Respiratory: Normal respiratory rate and rhythm.  CTAB without rales or wheezes. Abdomen: Abdomen soft.  Mild right lower quadrant TTP with voluntary guarding. No ascites, distension, hepatosplenomegaly.   MSK: No deformities or effusions. Neuro: Awake and alert.  EOMI, moves all extremities. Speech fluent.    Psych: Sensorium intact and responding to questions, attention normal. Affect normal.  Judgment and insight appear normal.    Data Reviewed: I have personally reviewed following labs and imaging studies:  CBC: Recent Labs  Lab 04/06/21 0709 04/07/21 0531  04/08/21 0514  WBC 9.1 7.5 6.0  NEUTROABS 6.5  --  3.8  HGB 12.4 9.9* 10.5*  HCT 37.7 30.6* 32.0*  MCV 89.5 92.4 90.7  PLT 250 199 371   Basic Metabolic Panel: Recent Labs  Lab 04/06/21 0709 04/07/21 0531 04/08/21 0514  NA 138 138 141  K 4.2 4.3 4.2  CL 103 105 108  CO2 26 24 27   GLUCOSE 225* 157* 120*  BUN 36* 42* 41*  CREATININE 1.71* 3.25* 2.71*  CALCIUM 10.1 8.8* 9.0  PHOS  --   --  3.5   GFR: Estimated Creatinine Clearance: 15.5 mL/min (A) (by C-G formula based on SCr of 2.71 mg/dL (H)). Liver Function Tests: Recent Labs  Lab 04/06/21 0709 04/08/21 0514  AST 35  --   ALT 35  --   ALKPHOS 58  --   BILITOT 0.6  --   PROT 8.4*  --    ALBUMIN 4.5 3.4*   Recent Labs  Lab 04/06/21 0709  LIPASE 105*   No results for input(s): AMMONIA in the last 168 hours. Coagulation Profile: No results for input(s): INR, PROTIME in the last 168 hours. Cardiac Enzymes: No results for input(s): CKTOTAL, CKMB, CKMBINDEX, TROPONINI in the last 168 hours. BNP (last 3 results) No results for input(s): PROBNP in the last 8760 hours. HbA1C: Recent Labs    04/06/21 0709  HGBA1C 7.8*   CBG: Recent Labs  Lab 04/07/21 2302 04/08/21 0340 04/08/21 0732 04/08/21 1251 04/08/21 1653  GLUCAP 111* 107* 173* 126* 236*   Lipid Profile: No results for input(s): CHOL, HDL, LDLCALC, TRIG, CHOLHDL, LDLDIRECT in the last 72 hours. Thyroid Function Tests: No results for input(s): TSH, T4TOTAL, FREET4, T3FREE, THYROIDAB in the last 72 hours. Anemia Panel: No results for input(s): VITAMINB12, FOLATE, FERRITIN, TIBC, IRON, RETICCTPCT in the last 72 hours. Urine analysis:    Component Value Date/Time   COLORURINE YELLOW 04/06/2021 1026   APPEARANCEUR CLEAR 04/06/2021 1026   LABSPEC 1.012 04/06/2021 1026   PHURINE 5.0 04/06/2021 1026   GLUCOSEU 50 (A) 04/06/2021 1026   HGBUR SMALL (A) 04/06/2021 1026   BILIRUBINUR NEGATIVE 04/06/2021 1026   KETONESUR NEGATIVE 04/06/2021 1026   PROTEINUR 30 (A) 04/06/2021 1026   UROBILINOGEN 0.2 04/15/2012 1217   NITRITE NEGATIVE 04/06/2021 1026   LEUKOCYTESUR SMALL (A) 04/06/2021 1026   Sepsis Labs: @LABRCNTIP (procalcitonin:4,lacticacidven:4)  ) Recent Results (from the past 240 hour(s))  Resp Panel by RT-PCR (Flu A&B, Covid) Nasopharyngeal Swab     Status: None   Collection Time: 04/06/21  7:30 AM   Specimen: Nasopharyngeal Swab; Nasopharyngeal(NP) swabs in vial transport medium  Result Value Ref Range Status   SARS Coronavirus 2 by RT PCR NEGATIVE NEGATIVE Final    Comment: (NOTE) SARS-CoV-2 target nucleic acids are NOT DETECTED.  The SARS-CoV-2 RNA is generally detectable in upper  respiratory specimens during the acute phase of infection. The lowest concentration of SARS-CoV-2 viral copies this assay can detect is 138 copies/mL. A negative result does not preclude SARS-Cov-2 infection and should not be used as the sole basis for treatment or other patient management decisions. A negative result may occur with  improper specimen collection/handling, submission of specimen other than nasopharyngeal swab, presence of viral mutation(s) within the areas targeted by this assay, and inadequate number of viral copies(<138 copies/mL). A negative result must be combined with clinical observations, patient history, and epidemiological information. The expected result is Negative.  Fact Sheet for Patients:  EntrepreneurPulse.com.au  Fact Sheet for Healthcare  Providers:  IncredibleEmployment.be  This test is no t yet approved or cleared by the [place] and  has been authorized for detection and/or diagnosis of SARS-CoV-2 by FDA under an Emergency Use Authorization (EUA). This EUA will remain  in effect (meaning this test can be used) for the duration of the COVID-19 declaration under Section 564(b)(1) of the Act, 21 U.S.C.section 360bbb-3(b)(1), unless the authorization is terminated  or revoked sooner.       Influenza A by PCR NEGATIVE NEGATIVE Final   Influenza B by PCR NEGATIVE NEGATIVE Final    Comment: (NOTE) The Xpert Xpress SARS-CoV-2/FLU/RSV plus assay is intended as an aid in the diagnosis of influenza from Nasopharyngeal swab specimens and should not be used as a sole basis for treatment. Nasal washings and aspirates are unacceptable for Xpert Xpress SARS-CoV-2/FLU/RSV testing.  Fact Sheet for Patients: EntrepreneurPulse.com.au  Fact Sheet for Healthcare Providers: IncredibleEmployment.be  This test is not yet approved or cleared by the [place] FDA and has been  authorized for detection and/or diagnosis of SARS-CoV-2 by FDA under an Emergency Use Authorization (EUA). This EUA will remain in effect (meaning this test can be used) for the duration of the COVID-19 declaration under Section 564(b)(1) of the Act, 21 U.S.C. section 360bbb-3(b)(1), unless the authorization is terminated or revoked.  Performed at [facility], [place] [address] [place], [place] [address]          Radiology Studies: US RENAL  Result Date: 04/07/2021 CLINICAL DATA:  Acute kidney injury, history hypertension, stage III chronic kidney disease, type II diabetes mellitus EXAM: RENAL / URINARY TRACT ULTRASOUND COMPLETE COMPARISON:  Renal ultrasound 06/28/2016 FINDINGS: Right Kidney: Renal measurements: 8.5 x 4.5 x 4.2 cm = volume: 84 mL. Cortical thinning. Normal cortical echogenicity. No mass, hydronephrosis, or shadowing calcification. Left Kidney: Renal measurements: 8.3 x 4.7 x 4.3 cm = volume: 88 mL. Cortical thinning. Normal cortical echogenicity. No mass, hydronephrosis, or shadowing calcification. Bladder: Appears normal for degree of bladder distention. Other: N/A IMPRESSION: Age-related renal cortical thinning. No evidence of renal mass or hydronephrosis. Electronically Signed   By: Lavonia Dana M.D.   On: 04/07/2021 11:26        Scheduled Meds: . amLODipine  10 mg Oral Q breakfast  . aspirin EC  81 mg Oral Daily  . calcium carbonate  1 tablet Oral Q breakfast  . Chlorhexidine Gluconate Cloth  6 each Topical Daily  . cholecalciferol  1,000 Units Oral Daily  . [START ON 04/09/2021] cloNIDine  0.2 mg Transdermal Weekly  . enoxaparin (LOVENOX) injection  30 mg Subcutaneous Q24H  . feeding supplement  1 Container Oral TID BM  . ferrous sulfate  325 mg Oral Q breakfast  . insulin aspart  0-15 Units Subcutaneous Q4H  . levothyroxine  25 mcg Oral Q0600  . mometasone-formoterol  2 puff Inhalation BID  . pantoprazole  40 mg Oral Daily  . sertraline   150 mg Oral QHS   Continuous Infusions: . cefTRIAXone (ROCEPHIN)  IV 2 g (04/08/21 1100)     LOS: 2 days    Time spent: 25 minutes    Edwin Dada, MD [facility] 04/08/2021, 4:54 PM     Please page though George or Epic secure chat:  For [employer], contact charge nurse      NUTRITION: The patient's BMI is: Body mass index is 29.21 kg/m.[name] Seen by dietician.  I agree with the assessment and plan as outlined below: Nutrition Status:  Nutrition Problem: Increased nutrient needs Etiology: acute illness Signs/Symptoms: estimated needs Interventions: [employer]  .     [name] amLODipine  10 mg Oral Q breakfast  . aspirin EC  81 mg Oral Daily  . calcium carbonate  1 tablet Oral Q breakfast  . Chlorhexidine Gluconate Cloth  6 each Topical Daily  . cholecalciferol  1,000 Units Oral Daily  . [START ON 04/09/2021] cloNIDine  0.2 mg Transdermal Weekly  . enoxaparin (LOVENOX) injection  30 mg Subcutaneous Q24H  . feeding supplement  1 Container Oral TID BM  . ferrous sulfate  325 mg Oral Q breakfast  . insulin aspart  0-15 Units Subcutaneous Q4H  . levothyroxine  25 mcg Oral Q0600  . mometasone-formoterol  2 puff Inhalation BID  . pantoprazole  40 mg Oral Daily  . sertraline  150 mg Oral QHS   . cefTRIAXone (ROCEPHIN)  IV 2 g (04/08/21 1100)    acetaminophen, albuterol, cyclobenzaprine, gabapentin, loratadine, nitroGLYCERIN, ondansetron **OR** ondansetron (ZOFRAN) IV   Current Meds  Medication Sig  . acetaminophen (TYLENOL) 500 MG tablet Take 1,000 mg by mouth every 6 (six) hours as needed for mild pain, moderate pain, fever or headache.  . albuterol (VENTOLIN HFA) 108 (90 Base) MCG/ACT inhaler Inhale 1-2 puffs into the lungs every 6 (six) hours as needed for wheezing or shortness of breath.  [name] amLODipine (NORVASC) 10 MG tablet Take 10 mg by mouth daily with breakfast.  . aspirin 81 MG EC tablet Take 81 mg by mouth daily.  .  budesonide-formoterol (SYMBICORT) 160-4.5 MCG/ACT inhaler Inhale 2 puffs into the lungs 2 (two) times daily as needed (shortness of breath).  . Calcium Carbonate (CALTRATE 600 PO) Take 600 mg by mouth daily.  . Cholecalciferol 1000 units capsule Take 1,000 Units by mouth daily.  . cloNIDine (CATAPRES - DOSED IN MG/24 HR) 0.2 mg/24hr patch Place 0.2 mg onto the skin once a week.  . cyclobenzaprine (FLEXERIL) 10 MG tablet Take 10 mg by mouth 3 (three) times daily as needed for muscle spasms.  . ferrous sulfate 325 (65 FE) MG EC tablet Take 325 mg by mouth daily with breakfast.  . gabapentin (NEURONTIN) 100 MG capsule Take 100 mg by mouth 2 (two) times daily as needed (nerve pain).  . Glucagon HCl 1 MG SOLR Inject 1 mg into the skin as needed (blood sugar <67).  . hydrochlorothiazide (HYDRODIURIL) 25 MG tablet Take 25 mg by mouth every morning.  . Insulin Aspart (NOVOLOG FLEXPEN [place]) Inject 0-20 Units into the skin daily with supper. Dose per sliding scale  . insulin degludec (TRESIBA) 200 UNIT/ML FlexTouch Pen Inject 100 Units into the skin daily.  [name] levothyroxine (SYNTHROID) 25 MCG tablet Take 25 mcg by mouth daily before breakfast.  . loratadine (CLARITIN) 10 MG tablet Take 10 mg daily as needed by mouth (seasonal allergies).   . losartan (COZAAR) 100 MG tablet Take 50 mg by mouth daily.  . nitroGLYCERIN (NITROSTAT) 0.4 MG SL tablet Place 1 tablet (0.4 mg total) under the tongue every 5 (five) minutes as needed for chest pain.  [name] omeprazole (PRILOSEC) 40 MG capsule Take 1 capsule (40 mg total) by mouth 2 (two) times daily. (Patient taking differently: Take 40 mg by mouth 2 (two) times daily as needed (acid reflux).)  . sertraline (ZOLOFT) 50 MG tablet Take 150 mg by mouth at bedtime.  . traMADol (ULTRAM) 50 MG tablet Take 50 mg by mouth every 12 (twelve) hours as needed for moderate pain.

## 2021-04-09 LAB — BASIC METABOLIC PANEL
Anion gap: 8 (ref 5–15)
BUN: 36 mg/dL — ABNORMAL HIGH (ref 8–23)
CO2: 25 mmol/L (ref 22–32)
Calcium: 9.5 mg/dL (ref 8.9–10.3)
Chloride: 109 mmol/L (ref 98–111)
Creatinine, Ser: 1.91 mg/dL — ABNORMAL HIGH (ref 0.44–1.00)
GFR, Estimated: 26 mL/min — ABNORMAL LOW (ref >60)
Glucose, Bld: 138 mg/dL — ABNORMAL HIGH (ref 70–99)
Potassium: 4.1 mmol/L (ref 3.5–5.1)
Sodium: 142 mmol/L (ref 135–145)

## 2021-04-09 LAB — GLUCOSE, CAPILLARY
Glucose-Capillary: 169 mg/dL — ABNORMAL HIGH (ref 70–99)
Glucose-Capillary: 195 mg/dL — ABNORMAL HIGH (ref 70–99)
Glucose-Capillary: 190 mg/dL — ABNORMAL HIGH (ref 70–99)

## 2021-04-09 MED ORDER — LOPERAMIDE HCL 2 MG PO CAPS: 2.0000 mg | ORAL_CAPSULE | ORAL | Status: DC | PRN

## 2021-04-09 MED ORDER — AMOXICILLIN-POT CLAVULANATE 875-125 MG PO TABS: 1.0000 | ORAL_TABLET | Freq: Two times a day (BID) | ORAL | 0 refills | Status: AC

## 2021-04-09 NOTE — Progress Notes (Signed)
Nutrition Education Note  RD consulted for nutrition education regarding nutrition management for diverticulosis/ Diverticulitis.    RD provided "Fiber Restricted Nutrition Therapy" handout as well as "High Fiber Nutrition Therapy" handout from the Academy of Nutrition and Dietetics. Reviewed patient's dietary recall and discussed ways for pt to meet nutrition goals over the next several weeks. Explained reasons for pt to follow a low fiber diet over the next 6 weeks. Reviewed low fiber foods and high fiber foods. Discussed best practice for long term management of diverticulosis is a high fiber diet and discussed ways to gradually increase fiber in the diet.   Teach back method used. Pt verbalizes understanding of information provided.   Expect good compliance. Pt had support of daughter at home.  Body mass index is Body mass index is 29.21 kg/m.[name] Pt meets criteria for overweight based on current BMI.  Current diet order is soft. Labs and medications reviewed. No further nutrition interventions warranted at this time. If additional nutrition issues arise, please re-consult RD.  Diana Bibles, MS, RD, LDN Inpatient Clinical Dietitian Contact information available via Amion

## 2021-04-09 NOTE — Progress Notes (Signed)
Patient will be discharging with daughter later this afternoon. Belongings were returned. Education on medications will be provided.

## 2021-04-09 NOTE — Care Management Important Message (Signed)
Important Message  Patient Details IM Letter given to the Patient. Name: Diana Harper MRN: [id number] Date of Birth: Dec 08, 1935   Medicare Important Message Given:  Yes     Kerin Salen 04/09/2021, 12:01 PM

## 2021-04-09 NOTE — Discharge Summary (Signed)
Physician Discharge Summary  Mischele Detter SFK:[id number] DOB: [birth year] DOA: 04/06/2021  PCP: [name], MD  Admit date: 04/06/2021 Discharge date: 04/09/2021  Admitted From: Home  Disposition:  Home   Recommendations for Outpatient Follow-up:  1. Follow up with PCP Dr. Ernie Hew in 1 week 2. Dr. Ernie Hew: Please obtain BMP in 1 week and resume losartan/HCTZ as needed     Home Health: None  Equipment/Devices: None new  Discharge Condition: Fair  CODE STATUS: FULL Diet recommendation: Soft  Brief/Interim Summary: Diana Harper is a 85 y.o. F with hx DM, CKD, hypothyroidism, HTN and recurrent diverticulitis and depression who presented with 2 days worsening abdominal pain and nausea.    In the ER, CT found to have acute diverticulitis. Started on antibiotics and fluids.     PRINCIPAL HOSPITAL DIAGNOSIS: Acute diverticulitis    Discharge Diagnoses:  Acute diverticulitis  Acute kidney injury on CKD IIIb While in the hospital, Cr went from 1.7 on admission up to 3.2.  Given fluids and losartan/diuretic stopped and Cr improved back to 1.9, close to baseline.  CKD IV ruled out.  Repeat BMP in 1 week and resume losartan if needed.   Hypertension Losartan and HCTZ held on d/c.  To resume with Dr. Ernie Hew if needed.  Diabetes, type 2, well controlled without complication Continue home regimen, A1c 7.8%  Hypothyroidism  COPD No active flare      Discharge Instructions  Discharge Instructions    Discharge instructions   Complete by: As directed    From Dr. Loleta Books: You were admitted for diverticulitis  While you were here, you had a mild kidney injury  For now: Finish the course of antibiotics with Augmentin 875-125 mg twice daily for 3 more days (starting tonight)  Eat soft foods for a little while, you can resume a normal diet as you feel stronger   Drink plenty of fluids Drink between 50 and 64 oz of water per day (64 oz is a half gallon)  For now,  do not take your HCTZ or losartan Your blood pressure here has been normal, and they are harsh on the kidneys See Dr. Ernie Hew in 1 week and have her check labs and tell you if you can restart your medicines   For the diarrhea Take loperamide/Imodium 2 mg up to 4 times per day   Increase activity slowly   Complete by: As directed      Allergies as of 04/09/2021      Reactions   Latex Anaphylaxis, Shortness Of Breath   : severe respiratory distress   Onion Diarrhea   Lisinopril Cough   Metformin And Related Diarrhea   Simvastatin Hives, Rash   Tetracycline Rash   Zithromax [azithromycin] Rash      Medication List    STOP taking these medications   hydrochlorothiazide 25 MG tablet Commonly known as: HYDRODIURIL   losartan 100 MG tablet Commonly known as: COZAAR     TAKE these medications   acetaminophen 500 MG tablet Commonly known as: TYLENOL Take 1,000 mg by mouth every 6 (six) hours as needed for mild pain, moderate pain, fever or headache.   albuterol 108 (90 Base) MCG/ACT inhaler Commonly known as: VENTOLIN HFA Inhale 1-2 puffs into the lungs every 6 (six) hours as needed for wheezing or shortness of breath.   amLODipine 10 MG tablet Commonly known as: NORVASC Take 10 mg by mouth daily with breakfast.   amoxicillin-clavulanate 875-125 MG tablet Commonly known as: Augmentin Take 1 tablet  by mouth 2 (two) times daily for 4 days.   aspirin 81 MG EC tablet Take 81 mg by mouth daily.   budesonide-formoterol 160-4.5 MCG/ACT inhaler Commonly known as: SYMBICORT Inhale 2 puffs into the lungs 2 (two) times daily as needed (shortness of breath).   CALTRATE 600 PO Take 600 mg by mouth daily.   Cholecalciferol 25 MCG (1000 UT) capsule Take 1,000 Units by mouth daily.   cloNIDine 0.2 mg/24hr patch Commonly known as: CATAPRES - Dosed in mg/24 hr Place 0.2 mg onto the skin once a week.   cyclobenzaprine 10 MG tablet Commonly known as: FLEXERIL Take 10 mg by mouth  3 (three) times daily as needed for muscle spasms.   ferrous sulfate 325 (65 FE) MG EC tablet Take 325 mg by mouth daily with breakfast.   gabapentin 100 MG capsule Commonly known as: NEURONTIN Take 100 mg by mouth 2 (two) times daily as needed (nerve pain).   Glucagon HCl 1 MG Solr Inject 1 mg into the skin as needed (blood sugar <67).   hyoscyamine 0.125 MG SL tablet Commonly known as: LEVSIN SL Place 1 tablet (0.125 mg total) under the tongue every 4 (four) hours as needed. When having large meals   insulin degludec 200 UNIT/ML FlexTouch Pen Commonly known as: TRESIBA Inject 100 Units into the skin daily.   levothyroxine 25 MCG tablet Commonly known as: SYNTHROID Take 25 mcg by mouth daily before breakfast.   loratadine 10 MG tablet Commonly known as: CLARITIN Take 10 mg daily as needed by mouth (seasonal allergies).   nitroGLYCERIN 0.4 MG SL tablet Commonly known as: NITROSTAT Place 1 tablet (0.4 mg total) under the tongue every 5 (five) minutes as needed for chest pain.   NOVOLOG FLEXPEN [place] Inject 0-20 Units into the skin daily with supper. Dose per sliding scale   omeprazole 40 MG capsule Commonly known as: PRILOSEC Take 1 capsule (40 mg total) by mouth 2 (two) times daily. What changed:   when to take this  reasons to take this   sertraline 50 MG tablet Commonly known as: ZOLOFT Take 150 mg by mouth at bedtime.   traMADol 50 MG tablet Commonly known as: ULTRAM Take 50 mg by mouth every 12 (twelve) hours as needed for moderate pain.       Follow-up Information    [name], MD. Schedule an appointment as soon as possible for a visit in 1 week(s).   Specialty: Family Medicine Contact information: [address] [place] [address] [phone]              Allergies  Allergen Reactions  . Latex Anaphylaxis and Shortness Of Breath    : severe respiratory distress  . Onion Diarrhea  . Lisinopril Cough  . Metformin And  Related Diarrhea  . Simvastatin Hives and Rash  . Tetracycline Rash  . Zithromax [Azithromycin] Rash      Procedures/Studies: CT ABDOMEN PELVIS WO CONTRAST  Result Date: 04/06/2021 CLINICAL DATA:  85 year old female with right flank pain since yesterday. EXAM: CT ABDOMEN AND PELVIS WITHOUT CONTRAST TECHNIQUE: Multidetector CT imaging of the abdomen and pelvis was performed following the standard protocol without IV contrast. COMPARISON:  CT Abdomen and Pelvis 08/19/2020 and earlier. FINDINGS: Lower chest: Stable since last year with mild linear scarring and atelectasis in the lingula, and mildly involving the right minor fissure. No pericardial or pleural effusion. Hepatobiliary: Chronically absent gallbladder. Stable noncontrast liver. Pancreas: Negative. Spleen: Negative. Adrenals/Urinary Tract: Normal  adrenal glands. Stable and negative noncontrast kidneys. Decompressed ureters. Urinary bladder remains normal. Stomach/Bowel: Negative rectum. Diverticulosis throughout the sigmoid colon with active inflammation involving a 3-4 cm segment of proximal to mid sigmoid seen on series 2 images 64 and 65. Underlying wall thickening. No extraluminal gas. Extensive diverticulosis continues in the descending colon, and moderately affects the transverse and right colon. No other active inflammation identified. Normal appendix in the right hemipelvis on coronal image 94. Decompressed and negative terminal ileum. No dilated or abnormal small bowel. Negative stomach and duodenum. No free fluid. Vascular/Lymphatic: Aortoiliac calcified atherosclerosis. Normal caliber abdominal aorta. No lymphadenopathy. Reproductive: Surgically absent as before. Other: No pelvic free fluid. Incidental numerous pelvic phleboliths. Musculoskeletal: Widespread advanced spinal disc and endplate degeneration. Widespread vacuum disc. Widespread lumbar facet degeneration. No acute osseous abnormality identified. IMPRESSION: 1. Acute  Diverticulitis involving a 3-4 cm segment of the sigmoid colon. No perforation or abscess. 2. No other acute or inflammatory process identified in the non-contrast abdomen and pelvis. 3. Aortic Atherosclerosis (ICD10-I70.0). Electronically Signed   By: Genevie Ann M.D.   On: 04/06/2021 10:44   US RENAL  Result Date: 04/07/2021 CLINICAL DATA:  Acute kidney injury, history hypertension, stage III chronic kidney disease, type II diabetes mellitus EXAM: RENAL / URINARY TRACT ULTRASOUND COMPLETE COMPARISON:  Renal ultrasound 06/28/2016 FINDINGS: Right Kidney: Renal measurements: 8.5 x 4.5 x 4.2 cm = volume: 84 mL. Cortical thinning. Normal cortical echogenicity. No mass, hydronephrosis, or shadowing calcification. Left Kidney: Renal measurements: 8.3 x 4.7 x 4.3 cm = volume: 88 mL. Cortical thinning. Normal cortical echogenicity. No mass, hydronephrosis, or shadowing calcification. Bladder: Appears normal for degree of bladder distention. Other: N/A IMPRESSION: Age-related renal cortical thinning. No evidence of renal mass or hydronephrosis. Electronically Signed   By: Lavonia Dana M.D.   On: 04/07/2021 11:26       Subjective: Feeling well.  No abdominal pain, no vomiting.  Mild diarrhea this morning.  No confusion, fever, chills.  Discharge Exam: Vitals:   04/09/21 0344 04/09/21 0821  BP: 114/63   Pulse: 77   Resp: 17   Temp: 98.7 F (37.1 C)   SpO2: 96% 96%   Vitals:   04/08/21 1935 04/08/21 1946 04/09/21 0344 04/09/21 0821  BP:  126/81 114/63   Pulse:  84 77   Resp:  17 17   Temp:  98.4 F (36.9 C) 98.7 F (37.1 C)   TempSrc:  Oral Oral   SpO2: 96% 98% 96% 96%  Weight:      Height:        General: Pt is alert, awake, not in acute distress, lying in bed. Cardiovascular: RRR, nl S1-S2, no murmurs appreciated.   No LE edema.   Respiratory: Normal respiratory rate and rhythm.  CTAB without rales or wheezes. Abdominal: Abdomen soft and non-tender.  No distension or HSM.   Neuro/Psych:  Strength symmetric in upper and lower extremities.  Judgment and insight appear normal.   The results of significant diagnostics from this hospitalization (including imaging, microbiology, ancillary and laboratory) are listed below for reference.     Microbiology: Recent Results (from the past 240 hour(s))  Resp Panel by RT-PCR (Flu A&B, Covid) Nasopharyngeal Swab     Status: None   Collection Time: 04/06/21  7:30 AM   Specimen: Nasopharyngeal Swab; Nasopharyngeal(NP) swabs in vial transport medium  Result Value Ref Range Status   SARS Coronavirus 2 by RT PCR NEGATIVE NEGATIVE Final    Comment: (NOTE) SARS-CoV-2 target nucleic  acids are NOT DETECTED.  The SARS-CoV-2 RNA is generally detectable in upper respiratory specimens during the acute phase of infection. The lowest concentration of SARS-CoV-2 viral copies this assay can detect is 138 copies/mL. A negative result does not preclude SARS-Cov-2 infection and should not be used as the sole basis for treatment or other patient management decisions. A negative result may occur with  improper specimen collection/handling, submission of specimen other than nasopharyngeal swab, presence of viral mutation(s) within the areas targeted by this assay, and inadequate number of viral copies(<138 copies/mL). A negative result must be combined with clinical observations, patient history, and epidemiological information. The expected result is Negative.  Fact Sheet for Patients:  EntrepreneurPulse.com.au  Fact Sheet for Healthcare Providers:  IncredibleEmployment.be  This test is no t yet approved or cleared by the [place] FDA and  has been authorized for detection and/or diagnosis of SARS-CoV-2 by FDA under an Emergency Use Authorization (EUA). This EUA will remain  in effect (meaning this test can be used) for the duration of the COVID-19 declaration under Section 564(b)(1) of the Act,  21 U.S.C.section 360bbb-3(b)(1), unless the authorization is terminated  or revoked sooner.       Influenza A by PCR NEGATIVE NEGATIVE Final   Influenza B by PCR NEGATIVE NEGATIVE Final    Comment: (NOTE) The Xpert Xpress SARS-CoV-2/FLU/RSV plus assay is intended as an aid in the diagnosis of influenza from Nasopharyngeal swab specimens and should not be used as a sole basis for treatment. Nasal washings and aspirates are unacceptable for Xpert Xpress SARS-CoV-2/FLU/RSV testing.  Fact Sheet for Patients: EntrepreneurPulse.com.au  Fact Sheet for Healthcare Providers: IncredibleEmployment.be  This test is not yet approved or cleared by the [place] FDA and has been authorized for detection and/or diagnosis of SARS-CoV-2 by FDA under an Emergency Use Authorization (EUA). This EUA will remain in effect (meaning this test can be used) for the duration of the COVID-19 declaration under Section 564(b)(1) of the Act, 21 U.S.C. section 360bbb-3(b)(1), unless the authorization is terminated or revoked.  Performed at [facility], [place] [address], [place], [place] [address]      Labs: BNP (last 3 results) Recent Labs    01/19/21 1504  BNP 71.6   Basic Metabolic Panel: Recent Labs  Lab 04/06/21 0709 04/07/21 0531 04/08/21 0514 04/09/21 0507  NA 138 138 141 142  K 4.2 4.3 4.2 4.1  CL 103 105 108 109  CO2 26 24 27 25   GLUCOSE 225* 157* 120* 138*  BUN 36* 42* 41* 36*  CREATININE 1.71* 3.25* 2.71* 1.91*  CALCIUM 10.1 8.8* 9.0 9.5  PHOS  --   --  3.5  --    Liver Function Tests: Recent Labs  Lab 04/06/21 0709 04/08/21 0514  AST 35  --   ALT 35  --   ALKPHOS 58  --   BILITOT 0.6  --   PROT 8.4*  --   ALBUMIN 4.5 3.4*   Recent Labs  Lab 04/06/21 0709  LIPASE 105*   No results for input(s): AMMONIA in the last 168 hours. CBC: Recent Labs  Lab 04/06/21 0709 04/07/21 0531 04/08/21 0514  WBC 9.1 7.5  6.0  NEUTROABS 6.5  --  3.8  HGB 12.4 9.9* 10.5*  HCT 37.7 30.6* 32.0*  MCV 89.5 92.4 90.7  PLT 250 199 202   Cardiac Enzymes: No results for input(s): CKTOTAL, CKMB, CKMBINDEX, TROPONINI in the last 168 hours. BNP: Invalid input(s): POCBNP CBG: Recent Labs  Lab 04/08/21 1947 04/08/21 2340 04/09/21 0346 04/09/21 0800 04/09/21 1115  GLUCAP 162* 317* 169* 195* 190*   D-Dimer No results for input(s): DDIMER in the last 72 hours. Hgb A1c No results for input(s): HGBA1C in the last 72 hours. Lipid Profile No results for input(s): CHOL, HDL, LDLCALC, TRIG, CHOLHDL, LDLDIRECT in the last 72 hours. Thyroid function studies No results for input(s): TSH, T4TOTAL, T3FREE, THYROIDAB in the last 72 hours.  Invalid input(s): FREET3 Anemia work up No results for input(s): VITAMINB12, FOLATE, FERRITIN, TIBC, IRON, RETICCTPCT in the last 72 hours. Urinalysis    Component Value Date/Time   COLORURINE YELLOW 04/06/2021 1026   APPEARANCEUR CLEAR 04/06/2021 1026   LABSPEC 1.012 04/06/2021 1026   PHURINE 5.0 04/06/2021 1026   GLUCOSEU 50 (A) 04/06/2021 1026   HGBUR SMALL (A) 04/06/2021 1026   BILIRUBINUR NEGATIVE 04/06/2021 1026   KETONESUR NEGATIVE 04/06/2021 1026   PROTEINUR 30 (A) 04/06/2021 1026   UROBILINOGEN 0.2 04/15/2012 1217   NITRITE NEGATIVE 04/06/2021 1026   LEUKOCYTESUR SMALL (A) 04/06/2021 1026   Sepsis Labs Invalid input(s): PROCALCITONIN,  WBC,  LACTICIDVEN Microbiology Recent Results (from the past 240 hour(s))  Resp Panel by RT-PCR (Flu A&B, Covid) Nasopharyngeal Swab     Status: None   Collection Time: 04/06/21  7:30 AM   Specimen: Nasopharyngeal Swab; Nasopharyngeal(NP) swabs in vial transport medium  Result Value Ref Range Status   SARS Coronavirus 2 by RT PCR NEGATIVE NEGATIVE Final    Comment: (NOTE) SARS-CoV-2 target nucleic acids are NOT DETECTED.  The SARS-CoV-2 RNA is generally detectable in upper respiratory specimens during the acute phase of  infection. The lowest concentration of SARS-CoV-2 viral copies this assay can detect is 138 copies/mL. A negative result does not preclude SARS-Cov-2 infection and should not be used as the sole basis for treatment or other patient management decisions. A negative result may occur with  improper specimen collection/handling, submission of specimen other than nasopharyngeal swab, presence of viral mutation(s) within the areas targeted by this assay, and inadequate number of viral copies(<138 copies/mL). A negative result must be combined with clinical observations, patient history, and epidemiological information. The expected result is Negative.  Fact Sheet for Patients:  EntrepreneurPulse.com.au  Fact Sheet for Healthcare Providers:  IncredibleEmployment.be  This test is no t yet approved or cleared by the [place] FDA and  has been authorized for detection and/or diagnosis of SARS-CoV-2 by FDA under an Emergency Use Authorization (EUA). This EUA will remain  in effect (meaning this test can be used) for the duration of the COVID-19 declaration under Section 564(b)(1) of the Act, 21 U.S.C.section 360bbb-3(b)(1), unless the authorization is terminated  or revoked sooner.       Influenza A by PCR NEGATIVE NEGATIVE Final   Influenza B by PCR NEGATIVE NEGATIVE Final    Comment: (NOTE) The Xpert Xpress SARS-CoV-2/FLU/RSV plus assay is intended as an aid in the diagnosis of influenza from Nasopharyngeal swab specimens and should not be used as a sole basis for treatment. Nasal washings and aspirates are unacceptable for Xpert Xpress SARS-CoV-2/FLU/RSV testing.  Fact Sheet for Patients: EntrepreneurPulse.com.au  Fact Sheet for Healthcare Providers: IncredibleEmployment.be  This test is not yet approved or cleared by the [place] FDA and has been authorized for detection and/or diagnosis of SARS-CoV-2  by FDA under an Emergency Use Authorization (EUA). This EUA will remain in effect (meaning this test can be used) for the duration of the COVID-19 declaration under Section 564(b)(1) of the  Act, 21 U.S.C. section 360bbb-3(b)(1), unless the authorization is terminated or revoked.  Performed at [facility], [place] [address], [place], [place] [address]      Time coordinating discharge: 25 minutes     30 Day Unplanned Readmission Risk Score   Flowsheet Row ED to Hosp-Admission (Discharged) from 04/06/2021 in [facility]  30 Day Unplanned Readmission Risk Score (%) 20.61 Filed at 04/09/2021 1200     This score is the patient's risk of an unplanned readmission within 30 days of being discharged (0 -100%). The score is based on dignosis, age, lab data, medications, orders, and past utilization.   Low:  0-14.9   Medium: 15-21.9   High: 22-29.9   Extreme: 30 and above           SIGNED:   Edwin Dada, MD  [facility] 04/09/2021, 5:19 PM

## 2021-04-20 DIAGNOSIS — I10 Essential (primary) hypertension: Secondary | ICD-10-CM | POA: Diagnosis not present

## 2021-04-20 DIAGNOSIS — E1121 Type 2 diabetes mellitus with diabetic nephropathy: Secondary | ICD-10-CM | POA: Diagnosis not present

## 2021-04-20 DIAGNOSIS — K219 Gastro-esophageal reflux disease without esophagitis: Secondary | ICD-10-CM | POA: Diagnosis not present

## 2021-04-20 DIAGNOSIS — R32 Unspecified urinary incontinence: Secondary | ICD-10-CM | POA: Diagnosis not present

## 2021-05-01 DIAGNOSIS — N289 Disorder of kidney and ureter, unspecified: Secondary | ICD-10-CM | POA: Diagnosis not present

## 2021-05-05 DIAGNOSIS — H9190 Unspecified hearing loss, unspecified ear: Secondary | ICD-10-CM | POA: Diagnosis not present

## 2021-05-05 DIAGNOSIS — J4540 Moderate persistent asthma, uncomplicated: Secondary | ICD-10-CM | POA: Diagnosis not present

## 2021-05-05 DIAGNOSIS — J45901 Unspecified asthma with (acute) exacerbation: Secondary | ICD-10-CM | POA: Diagnosis not present

## 2021-05-05 DIAGNOSIS — J45990 Exercise induced bronchospasm: Secondary | ICD-10-CM | POA: Diagnosis not present

## 2021-05-05 DIAGNOSIS — I10 Essential (primary) hypertension: Secondary | ICD-10-CM | POA: Diagnosis not present

## 2021-05-05 DIAGNOSIS — E1165 Type 2 diabetes mellitus with hyperglycemia: Secondary | ICD-10-CM | POA: Diagnosis not present

## 2021-05-11 NOTE — Progress Notes (Signed)
Primary Physician/Referring:  [name], MD  Patient ID: Diana Harper, female    DOB: Jan 19, 1936, 85 y.o.   MRN: [id number]  Chief Complaint  Patient presents with   Shortness of Breath   Follow-up   HPI:    Diana Harper  is a 85 y.o. AA female with history of uncontrolled type 2 diabetes mellitus, hyperlipidemia, history of TIA in 2019, hypertension, and chronic kidney disease.  Patient was originally referred to our office for evaluation of shortness of breath in 2019. History of COVID-19 infection in 10/2020.  Patient was hospitalized 04/06/2021 - 04/09/2021 with diverticulitis and mild acute on chronic kidney injury.  Patient's losartan and hydrochlorothiazide were held on discharge and she was advised to follow-up with PCP for further recommendations regarding antihypertensive medications.  Notably throughout patient's stay in the hospital her blood pressure was well controlled.  Patient now presents for hospital follow-up as well as 75-month follow-up of dyspnea on exertion, hyperlipidemia, and hypertension.  Since last office visit patient underwent upper endoscopy which revealed nonbleeding gastric ulcer and was started on omeprazole 40 mg twice daily.  Patient also underwent colonoscopy which revealed hemorrhoids and diverticulosis.   Patient reports her dyspnea on exertion is stable. She states is now taking Symbicort more consistently and using albuterol inhaler when needed, which seem to improve her dyspnea. She denies recurrence of chest pain. Denies palpitations, dizziness, syncope, near-syncope.   Past Medical History:  Diagnosis Date   Anemia    Anxiety    Arthritis    Asthma    Biliary colic    Cholelithiasis    CKD (chronic kidney disease), stage III [facility])    nephrologist--  dr Meredeth Ide   Colon polyps    Diverticulosis of colon    Gastric ulcer    Glaucoma    History of asthma    1980's  no longer problem since 1980's   History of diverticulitis of  colon    recurrent--  2014;  2013;  2012   Hyperlipidemia    Hypertension    Hypothyroidism    Insulin dependent type 2 diabetes mellitus ([place])    followed by dr Jenny Reichmann lambeth ([facility])    Murmur    Peripheral neuropathy    Stroke [facility])    Vitamin D deficiency    Wears hearing aid    bilateral   Wears partial dentures    lower partial and upper full   Past Surgical History:  Procedure Laterality Date   ABDOMINAL HYSTERECTOMY  1970's   CARDIOVASCULAR STRESS TEST  07/30/2009   normal exercise lexiscan nuclear study w/ no ischemia/  normal LV funciton and wall motion, ef 77%   CATARACT EXTRACTION W/ INTRAOCULAR LENS  IMPLANT, BILATERAL  2014   LAPAROSCOPIC CHOLECYSTECTOMY SINGLE SITE WITH INTRAOPERATIVE CHOLANGIOGRAM N/A 08/25/2016   Procedure: LAPAROSCOPIC CHOLECYSTECTOMY WITH INTRAOPERATIVE CHOLANGIOGRAM;  Surgeon: [name], MD;  Location: [facility];  Service: General;  Laterality: N/A;   LUMBAR SPINE SURGERY  2014   REMOVAL AXILLA CYST Right 74's   Family History  Problem Relation Age of Onset   Stroke Mother    Colon polyps Mother    Diabetes Mother    Diabetes Father    Kidney failure Father    COPD Father    Colon polyps Sister    Diabetes Sister    Stroke Brother    Diabetes Brother    Heart attack Maternal Grandmother    Heart disease Maternal Grandfather  Diabetes Paternal Grandmother    Stroke Paternal Grandfather    Hypertension Paternal Grandfather    Diabetes Sister    Diabetes Sister    Diabetes Son    Diabetes Daughter    Colon cancer Neg Hx    Esophageal cancer Neg Hx    Inflammatory bowel disease Neg Hx    Liver disease Neg Hx    Pancreatic cancer Neg Hx    Rectal cancer Neg Hx    Stomach cancer Neg Hx     Social History   Tobacco Use   Smoking status: Never   Smokeless tobacco: Never  Substance Use Topics   Alcohol use: No   Marital Status: Widowed   ROS  Review of Systems  Constitutional: Negative for  malaise/fatigue (improved) and weight gain.  Cardiovascular:  Positive for dyspnea on exertion (stable, better when she takes albuterol) and leg swelling (mild). Negative for chest pain, claudication, near-syncope, orthopnea, palpitations, paroxysmal nocturnal dyspnea and syncope.  Hematologic/Lymphatic: Does not bruise/bleed easily.  Gastrointestinal:  Negative for melena.  Neurological:  Negative for dizziness and weakness.   Objective  Blood pressure 115/61, pulse 75, temperature 98.1 F (36.7 C), height 5\' 3"  (1.6 m), weight 164 lb (74.4 kg), SpO2 96 %.  Vitals with BMI 05/12/2021 04/09/2021 04/08/2021  Height 5\' 3"  - -  Weight 164 lbs - -  BMI 54.62 - -  Systolic 703 500 938  Diastolic 61 63 81  Pulse 75 77 84     Physical Exam Vitals reviewed.  Neck:     Vascular: Carotid bruit present. No JVD.  Cardiovascular:     Rate and Rhythm: Normal rate and regular rhythm.     Pulses: Intact distal pulses.          Carotid pulses are 1+ on the right side and 1+ on the left side.      Radial pulses are 2+ on the right side and 2+ on the left side.       Dorsalis pedis pulses are 1+ on the right side and 1+ on the left side.       Posterior tibial pulses are 1+ on the right side and 1+ on the left side.     Heart sounds: S1 normal and S2 normal. No murmur heard.   No gallop.  Pulmonary:     Effort: Pulmonary effort is normal. No respiratory distress.     Breath sounds: No wheezing, rhonchi or rales.  Musculoskeletal:     Right lower leg: Edema (trace) present.     Left lower leg: Edema (trace) present.  Skin:    General: Skin is warm and dry.     Capillary Refill: Capillary refill takes less than 2 seconds.  Neurological:     General: No focal deficit present.     Mental Status: She is alert and oriented to person, place, and time.    Laboratory examination:   Recent Labs    01/19/21 1504 02/04/21 1700 04/07/21 0531 04/08/21 0514 04/09/21 0507  NA 145*   < > 138 141 142   K 4.6   < > 4.3 4.2 4.1  CL 104   < > 105 108 109  CO2 22   < > 24 27 25   GLUCOSE 145*   < > 157* 120* 138*  BUN 26   < > 42* 41* 36*  CREATININE 1.66*   < > 3.25* 2.71* 1.91*  CALCIUM 9.9   < > 8.8* 9.0 9.5  GFRNONAA  28*   < > 14* 17* 26*  GFRAA 33*  --   --   --   --    < > = values in this interval not displayed.   CrCl cannot be calculated (Patient's most recent lab result is older than the maximum 21 days allowed.).  CMP Latest Ref Rng & Units 04/09/2021 04/08/2021 04/07/2021  Glucose 70 - 99 mg/dL 138(H) 120(H) 157(H)  BUN 8 - 23 mg/dL 36(H) 41(H) 42(H)  Creatinine 0.44 - 1.00 mg/dL 1.91(H) 2.71(H) 3.25(H)  Sodium 135 - 145 mmol/L 142 141 138  Potassium 3.5 - 5.1 mmol/L 4.1 4.2 4.3  Chloride 98 - 111 mmol/L 109 108 105  CO2 22 - 32 mmol/L 25 27 24   Calcium 8.9 - 10.3 mg/dL 9.5 9.0 8.8(L)  Total Protein 6.5 - 8.1 g/dL - - -  Total Bilirubin 0.3 - 1.2 mg/dL - - -  Alkaline Phos 38 - 126 U/L - - -  AST 15 - 41 U/L - - -  ALT 0 - 44 U/L - - -   CBC Latest Ref Rng & Units 04/08/2021 04/07/2021 04/06/2021  WBC 4.0 - 10.5 K/uL 6.0 7.5 9.1  Hemoglobin 12.0 - 15.0 g/dL 10.5(L) 9.9(L) 12.4  Hematocrit 36.0 - 46.0 % 32.0(L) 30.6(L) 37.7  Platelets 150 - 400 K/uL 202 199 250    Lipid Panel No results for input(s): CHOL, TRIG, LDLCALC, VLDL, HDL, CHOLHDL, LDLDIRECT in the last 8760 hours.  HEMOGLOBIN A1C Lab Results  Component Value Date   HGBA1C 7.8 (H) 04/06/2021   MPG 177.16 04/06/2021   TSH Recent Labs    02/04/21 1700  TSH 2.67    External labs:  01/02/2021: Glucose 205, BUN 26, creatinine 1.74, GFR 27, sodium 143, potassium 4.5, AST 21, ALT 20 TSH 4.11 Vitamin D 16.6   11/10/2020: A1c 8.4%  05/14/2020: Glucose 117, BUN 25, creatinine 1.74, GFR 27, sodium 144, potassium 4.5 Total cholesterol 126, triglycerides 169, HDL 42, LDL 56 A1c 9.1%  01/29/2020: Hemoglobin 11.0, hematocrit 33.4, MCV 92, platelet 231  Allergies   Allergies  Allergen Reactions   Latex  Anaphylaxis and Shortness Of Breath    : severe respiratory distress   Onion Diarrhea   Lisinopril Cough   Metformin And Related Diarrhea   Simvastatin Hives and Rash   Tetracycline Rash   Zithromax [Azithromycin] Rash      Medications Prior to Visit:   Outpatient Medications Prior to Visit  Medication Sig Dispense Refill   acetaminophen (TYLENOL) 500 MG tablet Take 1,000 mg by mouth every 6 (six) hours as needed for mild pain, moderate pain, fever or headache.     albuterol (VENTOLIN HFA) 108 (90 Base) MCG/ACT inhaler Inhale 1-2 puffs into the lungs every 6 (six) hours as needed for wheezing or shortness of breath.     amLODipine (NORVASC) 10 MG tablet Take 10 mg by mouth daily with breakfast.     aspirin 81 MG EC tablet Take 81 mg by mouth daily.     budesonide-formoterol (SYMBICORT) 160-4.5 MCG/ACT inhaler Inhale 2 puffs into the lungs 2 (two) times daily as needed (shortness of breath).     Calcium Carbonate (CALTRATE 600 PO) Take 600 mg by mouth daily.     Cholecalciferol 1000 units capsule Take 1,000 Units by mouth daily.     cloNIDine (CATAPRES - DOSED IN MG/24 HR) 0.3 mg/24hr patch Place 0.3 mg onto the skin once a week.     cyclobenzaprine (FLEXERIL) 10 MG tablet Take  10 mg by mouth 3 (three) times daily as needed for muscle spasms.     ferrous sulfate 325 (65 FE) MG EC tablet Take 325 mg by mouth daily with breakfast.     gabapentin (NEURONTIN) 100 MG capsule Take 100 mg by mouth 2 (two) times daily as needed (nerve pain).     Glucagon HCl 1 MG SOLR Inject 1 mg into the skin as needed (blood sugar <67).     Insulin Aspart (NOVOLOG FLEXPEN [place]) Inject 0-20 Units into the skin daily with supper. Dose per sliding scale     insulin degludec (TRESIBA) 200 UNIT/ML FlexTouch Pen Inject 100 Units into the skin daily.     levothyroxine (SYNTHROID) 25 MCG tablet Take 25 mcg by mouth daily before breakfast.     loratadine (CLARITIN) 10 MG tablet Take 10 mg daily as needed by mouth  (seasonal allergies).      nitroGLYCERIN (NITROSTAT) 0.4 MG SL tablet Place 1 tablet (0.4 mg total) under the tongue every 5 (five) minutes as needed for chest pain. 30 tablet 3   omeprazole (PRILOSEC) 40 MG capsule Take 1 capsule (40 mg total) by mouth 2 (two) times daily. (Patient taking differently: Take 40 mg by mouth 2 (two) times daily as needed (acid reflux).) 90 capsule 3   oxybutynin (DITROPAN-XL) 10 MG 24 hr tablet Take 10 mg by mouth at bedtime.     sertraline (ZOLOFT) 50 MG tablet Take 150 mg by mouth at bedtime.     traMADol (ULTRAM) 50 MG tablet Take 50 mg by mouth every 12 (twelve) hours as needed for moderate pain.     cloNIDine (CATAPRES - DOSED IN MG/24 HR) 0.2 mg/24hr patch Place 0.2 mg onto the skin once a week.     hyoscyamine (LEVSIN SL) 0.125 MG SL tablet Place 1 tablet (0.125 mg total) under the tongue every 4 (four) hours as needed. When having large meals (Patient not taking: No sig reported) 30 tablet 1   No facility-administered medications prior to visit.     Final Medications at End of Visit    Current Meds  Medication Sig   acetaminophen (TYLENOL) 500 MG tablet Take 1,000 mg by mouth every 6 (six) hours as needed for mild pain, moderate pain, fever or headache.   albuterol (VENTOLIN HFA) 108 (90 Base) MCG/ACT inhaler Inhale 1-2 puffs into the lungs every 6 (six) hours as needed for wheezing or shortness of breath.   amLODipine (NORVASC) 10 MG tablet Take 10 mg by mouth daily with breakfast.   aspirin 81 MG EC tablet Take 81 mg by mouth daily.   budesonide-formoterol (SYMBICORT) 160-4.5 MCG/ACT inhaler Inhale 2 puffs into the lungs 2 (two) times daily as needed (shortness of breath).   Calcium Carbonate (CALTRATE 600 PO) Take 600 mg by mouth daily.   Cholecalciferol 1000 units capsule Take 1,000 Units by mouth daily.   cloNIDine (CATAPRES - DOSED IN MG/24 HR) 0.3 mg/24hr patch Place 0.3 mg onto the skin once a week.   cyclobenzaprine (FLEXERIL) 10 MG tablet Take  10 mg by mouth 3 (three) times daily as needed for muscle spasms.   ferrous sulfate 325 (65 FE) MG EC tablet Take 325 mg by mouth daily with breakfast.   gabapentin (NEURONTIN) 100 MG capsule Take 100 mg by mouth 2 (two) times daily as needed (nerve pain).   Glucagon HCl 1 MG SOLR Inject 1 mg into the skin as needed (blood sugar <67).   Insulin Aspart (NOVOLOG FLEXPEN [place])  Inject 0-20 Units into the skin daily with supper. Dose per sliding scale   insulin degludec (TRESIBA) 200 UNIT/ML FlexTouch Pen Inject 100 Units into the skin daily.   levothyroxine (SYNTHROID) 25 MCG tablet Take 25 mcg by mouth daily before breakfast.   loratadine (CLARITIN) 10 MG tablet Take 10 mg daily as needed by mouth (seasonal allergies).    nitroGLYCERIN (NITROSTAT) 0.4 MG SL tablet Place 1 tablet (0.4 mg total) under the tongue every 5 (five) minutes as needed for chest pain.   omeprazole (PRILOSEC) 40 MG capsule Take 1 capsule (40 mg total) by mouth 2 (two) times daily. (Patient taking differently: Take 40 mg by mouth 2 (two) times daily as needed (acid reflux).)   oxybutynin (DITROPAN-XL) 10 MG 24 hr tablet Take 10 mg by mouth at bedtime.   sertraline (ZOLOFT) 50 MG tablet Take 150 mg by mouth at bedtime.   traMADol (ULTRAM) 50 MG tablet Take 50 mg by mouth every 12 (twelve) hours as needed for moderate pain.   [DISCONTINUED] cloNIDine (CATAPRES - DOSED IN MG/24 HR) 0.2 mg/24hr patch Place 0.2 mg onto the skin once a week.   Radiology:   No results found.  Cardiac Studies:   Echocardiogram 02/03/2021:  Normal LV systolic function with visual EF 55-60%. Left ventricle cavity  is normal in size. Mild left ventricular hypertrophy. Normal global wall  motion. Normal diastolic filling pattern, normal LAP.  Mild (Grade I) aortic regurgitation.  Small pericardial effusion. There is no hemodynamic significance.  Compared to prior study dated 11/07/2018 no significant change.  Carotid artery duplex 02/03/2021:   Minimal plaque noted in the bilateral common carotid arteries. No  hemodynamically significant stenosis noted in bilateral ICA.  Antegrade right vertebral artery flow. Antegrade left vertebral artery  flow.  PCV MYOCARDIAL PERFUSION WITH LEXISCAN 01/21/2021 Lexiscan nuclear stress test performed using 1-day protocol. Normal myocardial perfusion. Stress LVEF 56%. Low risk study.  Event monitor 10/20/2018-11/18/2018 (no tracings available for review): Sinus rhythm with heart rate of 60 bpm.  With no evidence of atrial fibrillation, or other significant cardiac arrhythmias.  Lexiscan nuclear stress test 11/13/2018: 1.  Lexiscan stress test was performed.  Exercise capacity was not assessed.  No stress symptoms reported.  Normal blood pressure.  The resting stress EKG demonstrated normal sinus rhythm, normal resting conduction, no resting arrhythmias and normal rest repolarization.  Stress EKG nondiagnostic for ischemia as it is a pharmacological stress test. 2.  Overall quality of the study is excellent.  There is no evidence of abnormal lung activity.  Stress and rest SPECT images demonstrate homogenous tracer distribution throughout the myocardium.  Gated SPECT imaging reveals normal myocardial thickness and wall motion.  The left ventricular ejection fraction was normal at 68%. 3.  Low risk study.  EKG:   EKG 01/19/2021: Sinus rhythm at a rate of 66 bpm.  Normal axis.  Poor refreshing, cannot exclude anteroseptal infarct old.  Nonspecific T wave abnormality.  No significant change compared to EKG 12/17/2023 1.  EKG 10/20/2018: Sinus rhythm at 75 bpm.  Normal axis.  Normal conduction.  Normal EKG.  Assessment     ICD-10-CM   1. Dyspnea on exertion  R06.00     2. Hypercholesteremia  E78.00        Medications Discontinued During This Encounter  Medication Reason   hyoscyamine (LEVSIN SL) 0.125 MG SL tablet Error   cloNIDine (CATAPRES - DOSED IN MG/24 HR) 0.2 mg/24hr patch Completed  Course    No orders of  the defined types were placed in this encounter.   Recommendations:   Diana Harper is a 85 y.o. AA female with history of uncontrolled type 2 diabetes mellitus, hyperlipidemia, history of TIA in 2019, hypertension, and chronic kidney disease.  Patient was originally referred to our office for evaluation of shortness of breath in 2019. History of COVID-19 infection in 10/2020.  Patient was hospitalized 04/06/2021 - 04/09/2021 with diverticulitis and mild acute on chronic kidney injury.  Patient's losartan and hydrochlorothiazide were held on discharge and she was advised to follow-up with PCP for further recommendations regarding antihypertensive medications.  Notably throughout patient's stay in the hospital her blood pressure was well controlled.  Patient now presents for hospital follow-up as well as 64-month follow-up of dyspnea on exertion, hyperlipidemia, and hypertension.  Since last office visit patient underwent upper endoscopy which revealed nonbleeding gastric ulcer and was started on omeprazole 40 mg twice daily.  Patient also underwent colonoscopy which revealed hemorrhoids and diverticulosis.   Reviewed with patient echocardiogram, details above. Echocardiogram is stable compared to previous. Suspect underlying cause of patient's dyspnea on exertion to be due to underlying lung pathology. Encouraged to use her inhalers as directed and to continue to follow with PCP for further management. Patient is otherwise stable from cardiovascular standpoint. Blood pressure is well controlled.   Follow up in 6 months, sooner if needed, for hypertension, hyperlipidemia, history of TIA.    Alethia Berthold, PA-C 05/16/2021, 7:45 PM Office: [phone]

## 2021-05-12 ENCOUNTER — Encounter: Payer: Self-pay | Admitting: Student

## 2021-05-12 ENCOUNTER — Ambulatory Visit: Payer: Medicare Other | Admitting: Student

## 2021-05-12 ENCOUNTER — Other Ambulatory Visit: Payer: Self-pay

## 2021-05-12 VITALS — BP 115/61 | HR 75 | Temp 98.1°F | Ht 63.0 in | Wt 164.0 lb

## 2021-05-12 DIAGNOSIS — R0609 Other forms of dyspnea: Secondary | ICD-10-CM

## 2021-05-12 DIAGNOSIS — E7800 Pure hypercholesterolemia, unspecified: Secondary | ICD-10-CM

## 2021-05-14 DIAGNOSIS — E1140 Type 2 diabetes mellitus with diabetic neuropathy, unspecified: Secondary | ICD-10-CM | POA: Diagnosis not present

## 2021-05-14 DIAGNOSIS — E1165 Type 2 diabetes mellitus with hyperglycemia: Secondary | ICD-10-CM | POA: Diagnosis not present

## 2021-05-14 DIAGNOSIS — Z794 Long term (current) use of insulin: Secondary | ICD-10-CM | POA: Diagnosis not present

## 2021-05-14 DIAGNOSIS — E1121 Type 2 diabetes mellitus with diabetic nephropathy: Secondary | ICD-10-CM | POA: Diagnosis not present

## 2021-05-17 DIAGNOSIS — U071 COVID-19: Secondary | ICD-10-CM | POA: Diagnosis not present

## 2021-06-10 ENCOUNTER — Encounter: Payer: Self-pay | Admitting: Gastroenterology

## 2021-06-17 DIAGNOSIS — H43812 Vitreous degeneration, left eye: Secondary | ICD-10-CM | POA: Diagnosis not present

## 2021-06-17 DIAGNOSIS — H26492 Other secondary cataract, left eye: Secondary | ICD-10-CM | POA: Diagnosis not present

## 2021-06-17 DIAGNOSIS — Z961 Presence of intraocular lens: Secondary | ICD-10-CM | POA: Diagnosis not present

## 2021-06-17 DIAGNOSIS — E113391 Type 2 diabetes mellitus with moderate nonproliferative diabetic retinopathy without macular edema, right eye: Secondary | ICD-10-CM | POA: Diagnosis not present

## 2021-06-17 DIAGNOSIS — H31103 Choroidal degeneration, unspecified, bilateral: Secondary | ICD-10-CM | POA: Diagnosis not present

## 2021-06-17 DIAGNOSIS — E113492 Type 2 diabetes mellitus with severe nonproliferative diabetic retinopathy without macular edema, left eye: Secondary | ICD-10-CM | POA: Diagnosis not present

## 2021-07-31 DIAGNOSIS — E782 Mixed hyperlipidemia: Secondary | ICD-10-CM | POA: Diagnosis not present

## 2021-07-31 DIAGNOSIS — N289 Disorder of kidney and ureter, unspecified: Secondary | ICD-10-CM | POA: Diagnosis not present

## 2021-07-31 DIAGNOSIS — D649 Anemia, unspecified: Secondary | ICD-10-CM | POA: Diagnosis not present

## 2021-07-31 DIAGNOSIS — E039 Hypothyroidism, unspecified: Secondary | ICD-10-CM | POA: Diagnosis not present

## 2021-07-31 DIAGNOSIS — E559 Vitamin D deficiency, unspecified: Secondary | ICD-10-CM | POA: Diagnosis not present

## 2021-08-05 DIAGNOSIS — M791 Myalgia, unspecified site: Secondary | ICD-10-CM | POA: Diagnosis not present

## 2021-08-05 DIAGNOSIS — E1165 Type 2 diabetes mellitus with hyperglycemia: Secondary | ICD-10-CM | POA: Diagnosis not present

## 2021-08-05 DIAGNOSIS — E1121 Type 2 diabetes mellitus with diabetic nephropathy: Secondary | ICD-10-CM | POA: Diagnosis not present

## 2021-08-05 DIAGNOSIS — E039 Hypothyroidism, unspecified: Secondary | ICD-10-CM | POA: Diagnosis not present

## 2021-08-05 DIAGNOSIS — E1142 Type 2 diabetes mellitus with diabetic polyneuropathy: Secondary | ICD-10-CM | POA: Diagnosis not present

## 2021-08-05 DIAGNOSIS — E782 Mixed hyperlipidemia: Secondary | ICD-10-CM | POA: Diagnosis not present

## 2021-08-05 DIAGNOSIS — T466X5A Adverse effect of antihyperlipidemic and antiarteriosclerotic drugs, initial encounter: Secondary | ICD-10-CM | POA: Diagnosis not present

## 2021-08-05 DIAGNOSIS — D649 Anemia, unspecified: Secondary | ICD-10-CM | POA: Diagnosis not present

## 2021-08-05 DIAGNOSIS — E559 Vitamin D deficiency, unspecified: Secondary | ICD-10-CM | POA: Diagnosis not present

## 2021-08-17 DIAGNOSIS — Z794 Long term (current) use of insulin: Secondary | ICD-10-CM | POA: Diagnosis not present

## 2021-08-17 DIAGNOSIS — E1165 Type 2 diabetes mellitus with hyperglycemia: Secondary | ICD-10-CM | POA: Diagnosis not present

## 2021-08-17 DIAGNOSIS — E1140 Type 2 diabetes mellitus with diabetic neuropathy, unspecified: Secondary | ICD-10-CM | POA: Diagnosis not present

## 2021-08-17 DIAGNOSIS — E1121 Type 2 diabetes mellitus with diabetic nephropathy: Secondary | ICD-10-CM | POA: Diagnosis not present

## 2021-08-26 DIAGNOSIS — E039 Hypothyroidism, unspecified: Secondary | ICD-10-CM | POA: Diagnosis not present

## 2021-08-26 DIAGNOSIS — E11319 Type 2 diabetes mellitus with unspecified diabetic retinopathy without macular edema: Secondary | ICD-10-CM | POA: Diagnosis not present

## 2021-08-26 DIAGNOSIS — N179 Acute kidney failure, unspecified: Secondary | ICD-10-CM | POA: Diagnosis not present

## 2021-08-26 DIAGNOSIS — D638 Anemia in other chronic diseases classified elsewhere: Secondary | ICD-10-CM | POA: Diagnosis not present

## 2021-08-26 DIAGNOSIS — I129 Hypertensive chronic kidney disease with stage 1 through stage 4 chronic kidney disease, or unspecified chronic kidney disease: Secondary | ICD-10-CM | POA: Diagnosis not present

## 2021-08-26 DIAGNOSIS — E1165 Type 2 diabetes mellitus with hyperglycemia: Secondary | ICD-10-CM | POA: Diagnosis not present

## 2021-08-26 DIAGNOSIS — N1832 Chronic kidney disease, stage 3b: Secondary | ICD-10-CM | POA: Diagnosis not present

## 2021-08-26 DIAGNOSIS — E1122 Type 2 diabetes mellitus with diabetic chronic kidney disease: Secondary | ICD-10-CM | POA: Diagnosis not present

## 2021-10-30 DIAGNOSIS — J4540 Moderate persistent asthma, uncomplicated: Secondary | ICD-10-CM | POA: Diagnosis not present

## 2021-10-30 DIAGNOSIS — I10 Essential (primary) hypertension: Secondary | ICD-10-CM | POA: Diagnosis not present

## 2021-11-04 DIAGNOSIS — Z1331 Encounter for screening for depression: Secondary | ICD-10-CM | POA: Diagnosis not present

## 2021-11-04 DIAGNOSIS — F331 Major depressive disorder, recurrent, moderate: Secondary | ICD-10-CM | POA: Diagnosis not present

## 2021-11-04 DIAGNOSIS — Z0000 Encounter for general adult medical examination without abnormal findings: Secondary | ICD-10-CM | POA: Diagnosis not present

## 2021-11-04 DIAGNOSIS — G4700 Insomnia, unspecified: Secondary | ICD-10-CM | POA: Diagnosis not present

## 2021-11-04 DIAGNOSIS — F411 Generalized anxiety disorder: Secondary | ICD-10-CM | POA: Diagnosis not present

## 2021-11-04 DIAGNOSIS — Z1339 Encounter for screening examination for other mental health and behavioral disorders: Secondary | ICD-10-CM | POA: Diagnosis not present

## 2021-11-05 DIAGNOSIS — I10 Essential (primary) hypertension: Secondary | ICD-10-CM | POA: Diagnosis not present

## 2021-11-05 DIAGNOSIS — E0590 Thyrotoxicosis, unspecified without thyrotoxic crisis or storm: Secondary | ICD-10-CM | POA: Diagnosis not present

## 2021-11-05 DIAGNOSIS — E782 Mixed hyperlipidemia: Secondary | ICD-10-CM | POA: Diagnosis not present

## 2021-11-05 DIAGNOSIS — Z23 Encounter for immunization: Secondary | ICD-10-CM | POA: Diagnosis not present

## 2021-12-10 DIAGNOSIS — S300XXA Contusion of lower back and pelvis, initial encounter: Secondary | ICD-10-CM | POA: Diagnosis not present

## 2021-12-10 DIAGNOSIS — I10 Essential (primary) hypertension: Secondary | ICD-10-CM | POA: Diagnosis not present

## 2021-12-10 DIAGNOSIS — Z23 Encounter for immunization: Secondary | ICD-10-CM | POA: Diagnosis not present

## 2021-12-10 DIAGNOSIS — Z7185 Encounter for immunization safety counseling: Secondary | ICD-10-CM | POA: Diagnosis not present

## 2021-12-10 DIAGNOSIS — E1121 Type 2 diabetes mellitus with diabetic nephropathy: Secondary | ICD-10-CM | POA: Diagnosis not present

## 2021-12-21 DIAGNOSIS — E039 Hypothyroidism, unspecified: Secondary | ICD-10-CM | POA: Diagnosis not present

## 2021-12-21 DIAGNOSIS — E1122 Type 2 diabetes mellitus with diabetic chronic kidney disease: Secondary | ICD-10-CM | POA: Diagnosis not present

## 2021-12-21 DIAGNOSIS — N179 Acute kidney failure, unspecified: Secondary | ICD-10-CM | POA: Diagnosis not present

## 2021-12-21 DIAGNOSIS — D638 Anemia in other chronic diseases classified elsewhere: Secondary | ICD-10-CM | POA: Diagnosis not present

## 2021-12-21 DIAGNOSIS — E11319 Type 2 diabetes mellitus with unspecified diabetic retinopathy without macular edema: Secondary | ICD-10-CM | POA: Diagnosis not present

## 2021-12-21 DIAGNOSIS — N1832 Chronic kidney disease, stage 3b: Secondary | ICD-10-CM | POA: Diagnosis not present

## 2021-12-21 DIAGNOSIS — E1165 Type 2 diabetes mellitus with hyperglycemia: Secondary | ICD-10-CM | POA: Diagnosis not present

## 2021-12-21 DIAGNOSIS — I129 Hypertensive chronic kidney disease with stage 1 through stage 4 chronic kidney disease, or unspecified chronic kidney disease: Secondary | ICD-10-CM | POA: Diagnosis not present

## 2021-12-29 DIAGNOSIS — F331 Major depressive disorder, recurrent, moderate: Secondary | ICD-10-CM | POA: Diagnosis not present

## 2021-12-29 DIAGNOSIS — G4700 Insomnia, unspecified: Secondary | ICD-10-CM | POA: Diagnosis not present

## 2021-12-29 DIAGNOSIS — F411 Generalized anxiety disorder: Secondary | ICD-10-CM | POA: Diagnosis not present

## 2021-12-29 DIAGNOSIS — T148XXA Other injury of unspecified body region, initial encounter: Secondary | ICD-10-CM | POA: Diagnosis not present

## 2022-02-24 DIAGNOSIS — E039 Hypothyroidism, unspecified: Secondary | ICD-10-CM | POA: Diagnosis not present

## 2022-02-24 DIAGNOSIS — E1122 Type 2 diabetes mellitus with diabetic chronic kidney disease: Secondary | ICD-10-CM | POA: Diagnosis not present

## 2022-02-24 DIAGNOSIS — Z794 Long term (current) use of insulin: Secondary | ICD-10-CM | POA: Diagnosis not present

## 2022-02-24 DIAGNOSIS — N1831 Chronic kidney disease, stage 3a: Secondary | ICD-10-CM | POA: Diagnosis not present

## 2022-05-07 DIAGNOSIS — E0590 Thyrotoxicosis, unspecified without thyrotoxic crisis or storm: Secondary | ICD-10-CM | POA: Diagnosis not present

## 2022-05-11 DIAGNOSIS — G720 Drug-induced myopathy: Secondary | ICD-10-CM | POA: Diagnosis not present

## 2022-05-11 DIAGNOSIS — R0609 Other forms of dyspnea: Secondary | ICD-10-CM | POA: Diagnosis not present

## 2022-05-11 DIAGNOSIS — E782 Mixed hyperlipidemia: Secondary | ICD-10-CM | POA: Diagnosis not present

## 2022-05-11 DIAGNOSIS — R4789 Other speech disturbances: Secondary | ICD-10-CM | POA: Diagnosis not present

## 2022-05-11 DIAGNOSIS — U071 COVID-19: Secondary | ICD-10-CM | POA: Diagnosis not present

## 2022-05-11 DIAGNOSIS — I10 Essential (primary) hypertension: Secondary | ICD-10-CM | POA: Diagnosis not present

## 2022-05-11 DIAGNOSIS — E039 Hypothyroidism, unspecified: Secondary | ICD-10-CM | POA: Diagnosis not present

## 2022-05-11 DIAGNOSIS — Z789 Other specified health status: Secondary | ICD-10-CM | POA: Diagnosis not present

## 2022-05-11 DIAGNOSIS — R1310 Dysphagia, unspecified: Secondary | ICD-10-CM | POA: Diagnosis not present

## 2022-05-11 DIAGNOSIS — D649 Anemia, unspecified: Secondary | ICD-10-CM | POA: Diagnosis not present

## 2022-05-12 ENCOUNTER — Other Ambulatory Visit: Payer: Self-pay | Admitting: Family Medicine

## 2022-05-18 ENCOUNTER — Ambulatory Visit
Admission: RE | Admit: 2022-05-18 | Discharge: 2022-05-18 | Disposition: A | Discharge status: Home / Self Care | Payer: Medicare Other | Source: Ambulatory Visit | Attending: Family Medicine | Admitting: Family Medicine

## 2022-05-18 ENCOUNTER — Other Ambulatory Visit: Payer: Self-pay | Admitting: Family Medicine

## 2022-05-18 DIAGNOSIS — R0609 Other forms of dyspnea: Secondary | ICD-10-CM | POA: Diagnosis not present

## 2022-05-18 DIAGNOSIS — R1310 Dysphagia, unspecified: Secondary | ICD-10-CM

## 2022-05-18 DIAGNOSIS — R4789 Other speech disturbances: Secondary | ICD-10-CM

## 2022-05-19 DIAGNOSIS — E1142 Type 2 diabetes mellitus with diabetic polyneuropathy: Secondary | ICD-10-CM | POA: Diagnosis not present

## 2022-05-19 DIAGNOSIS — R7989 Other specified abnormal findings of blood chemistry: Secondary | ICD-10-CM | POA: Diagnosis not present

## 2022-05-19 DIAGNOSIS — I10 Essential (primary) hypertension: Secondary | ICD-10-CM | POA: Diagnosis not present

## 2022-05-19 DIAGNOSIS — E1165 Type 2 diabetes mellitus with hyperglycemia: Secondary | ICD-10-CM | POA: Diagnosis not present

## 2022-05-19 DIAGNOSIS — J449 Chronic obstructive pulmonary disease, unspecified: Secondary | ICD-10-CM | POA: Diagnosis not present

## 2022-05-19 DIAGNOSIS — E1121 Type 2 diabetes mellitus with diabetic nephropathy: Secondary | ICD-10-CM | POA: Diagnosis not present

## 2022-05-31 ENCOUNTER — Ambulatory Visit
Admission: RE | Admit: 2022-05-31 | Discharge: 2022-05-31 | Disposition: A | Discharge status: Home / Self Care | Payer: Medicare Other | Source: Ambulatory Visit | Attending: Family Medicine | Admitting: Family Medicine

## 2022-05-31 DIAGNOSIS — K224 Dyskinesia of esophagus: Secondary | ICD-10-CM | POA: Diagnosis not present

## 2022-05-31 DIAGNOSIS — R1310 Dysphagia, unspecified: Secondary | ICD-10-CM

## 2022-06-02 ENCOUNTER — Ambulatory Visit
Admission: RE | Admit: 2022-06-02 | Discharge: 2022-06-02 | Disposition: A | Discharge status: Home / Self Care | Payer: Medicare Other | Source: Ambulatory Visit | Attending: Family Medicine | Admitting: Family Medicine

## 2022-06-02 DIAGNOSIS — R4789 Other speech disturbances: Secondary | ICD-10-CM

## 2022-06-02 DIAGNOSIS — R1310 Dysphagia, unspecified: Secondary | ICD-10-CM

## 2022-06-04 DIAGNOSIS — E0590 Thyrotoxicosis, unspecified without thyrotoxic crisis or storm: Secondary | ICD-10-CM | POA: Diagnosis not present

## 2022-06-04 DIAGNOSIS — R7989 Other specified abnormal findings of blood chemistry: Secondary | ICD-10-CM | POA: Diagnosis not present

## 2022-06-04 DIAGNOSIS — I10 Essential (primary) hypertension: Secondary | ICD-10-CM | POA: Diagnosis not present

## 2022-06-04 DIAGNOSIS — E559 Vitamin D deficiency, unspecified: Secondary | ICD-10-CM | POA: Diagnosis not present

## 2022-06-04 DIAGNOSIS — R5383 Other fatigue: Secondary | ICD-10-CM | POA: Diagnosis not present

## 2022-06-07 DIAGNOSIS — N1831 Chronic kidney disease, stage 3a: Secondary | ICD-10-CM | POA: Diagnosis not present

## 2022-06-07 DIAGNOSIS — E1122 Type 2 diabetes mellitus with diabetic chronic kidney disease: Secondary | ICD-10-CM | POA: Diagnosis not present

## 2022-06-07 DIAGNOSIS — Z794 Long term (current) use of insulin: Secondary | ICD-10-CM | POA: Diagnosis not present

## 2022-06-07 DIAGNOSIS — E039 Hypothyroidism, unspecified: Secondary | ICD-10-CM | POA: Diagnosis not present

## 2022-06-08 DIAGNOSIS — E039 Hypothyroidism, unspecified: Secondary | ICD-10-CM | POA: Diagnosis not present

## 2022-06-08 DIAGNOSIS — G4700 Insomnia, unspecified: Secondary | ICD-10-CM | POA: Diagnosis not present

## 2022-06-08 DIAGNOSIS — F411 Generalized anxiety disorder: Secondary | ICD-10-CM | POA: Diagnosis not present

## 2022-06-08 DIAGNOSIS — R7989 Other specified abnormal findings of blood chemistry: Secondary | ICD-10-CM | POA: Diagnosis not present

## 2022-06-08 DIAGNOSIS — I10 Essential (primary) hypertension: Secondary | ICD-10-CM | POA: Diagnosis not present

## 2022-06-08 DIAGNOSIS — F331 Major depressive disorder, recurrent, moderate: Secondary | ICD-10-CM | POA: Diagnosis not present

## 2022-06-08 DIAGNOSIS — Z79899 Other long term (current) drug therapy: Secondary | ICD-10-CM | POA: Diagnosis not present

## 2022-06-08 DIAGNOSIS — E559 Vitamin D deficiency, unspecified: Secondary | ICD-10-CM | POA: Diagnosis not present

## 2022-06-10 DIAGNOSIS — Z9181 History of falling: Secondary | ICD-10-CM | POA: Diagnosis not present

## 2022-06-10 DIAGNOSIS — M25512 Pain in left shoulder: Secondary | ICD-10-CM | POA: Diagnosis not present

## 2022-06-16 ENCOUNTER — Other Ambulatory Visit: Payer: Self-pay | Admitting: Nurse Practitioner

## 2022-06-16 ENCOUNTER — Ambulatory Visit
Admission: RE | Admit: 2022-06-16 | Discharge: 2022-06-16 | Disposition: A | Discharge status: Home / Self Care | Payer: Medicare Other | Source: Ambulatory Visit | Attending: Nurse Practitioner | Admitting: Nurse Practitioner

## 2022-06-16 DIAGNOSIS — W19XXXA Unspecified fall, initial encounter: Secondary | ICD-10-CM

## 2022-06-16 DIAGNOSIS — M25512 Pain in left shoulder: Secondary | ICD-10-CM

## 2022-06-17 DIAGNOSIS — R32 Unspecified urinary incontinence: Secondary | ICD-10-CM | POA: Diagnosis not present

## 2022-06-17 DIAGNOSIS — M19019 Primary osteoarthritis, unspecified shoulder: Secondary | ICD-10-CM | POA: Diagnosis not present

## 2022-06-17 DIAGNOSIS — I639 Cerebral infarction, unspecified: Secondary | ICD-10-CM | POA: Diagnosis not present

## 2022-06-17 DIAGNOSIS — I10 Essential (primary) hypertension: Secondary | ICD-10-CM | POA: Diagnosis not present

## 2022-06-17 DIAGNOSIS — Z79899 Other long term (current) drug therapy: Secondary | ICD-10-CM | POA: Diagnosis not present

## 2022-06-23 ENCOUNTER — Telehealth: Payer: Self-pay

## 2022-06-23 NOTE — Patient Outreach (Signed)
  Care Management   Outreach Note  06/23/2022 Name: Makyia Erxleben MRN: [id number] DOB: 07-06-1936  An unsuccessful telephone outreach was attempted today. The patient was referred to the case management team for assistance with care management and care coordination.   Follow Up Plan:  The care management team will reach out to the patient again over the next 10 days.   Daneen Schick, BSW, CDP Social Worker, Certified Dementia Practitioner Care Coordination [phone]

## 2022-06-25 ENCOUNTER — Ambulatory Visit: Payer: Self-pay

## 2022-06-25 DIAGNOSIS — M19012 Primary osteoarthritis, left shoulder: Secondary | ICD-10-CM | POA: Diagnosis not present

## 2022-06-25 DIAGNOSIS — H9193 Unspecified hearing loss, bilateral: Secondary | ICD-10-CM | POA: Diagnosis not present

## 2022-06-25 DIAGNOSIS — I69354 Hemiplegia and hemiparesis following cerebral infarction affecting left non-dominant side: Secondary | ICD-10-CM | POA: Diagnosis not present

## 2022-06-25 DIAGNOSIS — E1140 Type 2 diabetes mellitus with diabetic neuropathy, unspecified: Secondary | ICD-10-CM | POA: Diagnosis not present

## 2022-06-25 DIAGNOSIS — K760 Fatty (change of) liver, not elsewhere classified: Secondary | ICD-10-CM | POA: Diagnosis not present

## 2022-06-25 DIAGNOSIS — Z9181 History of falling: Secondary | ICD-10-CM | POA: Diagnosis not present

## 2022-06-25 DIAGNOSIS — I10 Essential (primary) hypertension: Secondary | ICD-10-CM | POA: Diagnosis not present

## 2022-06-25 DIAGNOSIS — R32 Unspecified urinary incontinence: Secondary | ICD-10-CM | POA: Diagnosis not present

## 2022-06-25 NOTE — Patient Outreach (Signed)
  Care Coordination   Initial Visit Note   06/25/2022 Name: Janell Keeling MRN: [id number] DOB: 04-Jul-1936  Kathy Wahid is a 86 y.o. year old female who sees [name], MD for primary care. I  spoke with patients daughter and caregiver [name] by phone today.  What matters to the patients health and wellness today?  Complete home health OT eval this afternoon   Goals Addressed               This Visit's Progress     Patient Stated     Complete Advance Directives (pt-stated)        Care Coordination Interventions: Education provided on the importance of an Advance Directive Mailed Advance Directive packet to the patients home for review Scheduled follow up call to assist with completion as needed      COMPLETED: Complete foot and eye exam (pt-stated)        Care Coordination Interventions: Performed chart review to note patient has not completed a diabetic foot and eye exam this calendar year Discussed with patients daughter Babs Sciara the importance of completing these exams yearly Confirmed patient is active with Dr. Katy Fitch and is scheduled for eye exam in the fall Advised [name] to speak with patients health care provider for a podiatry referral in order to complete foot exam if warranted        SDOH assessments and interventions completed:   Yes SDOH Interventions Today    Flowsheet Row Most Recent Value  SDOH Interventions   Food Insecurity Interventions Intervention Not Indicated  Housing Interventions Intervention Not Indicated  Transportation Interventions Intervention Not Indicated       Care Coordination Interventions Activated:  Yes Care Coordination Interventions:  Yes, provided  Follow up plan: Follow up call scheduled for 8/18  Encounter Outcome:  Pt. Visit Completed  Daneen Schick, BSW, CDP Social Worker, Certified Dementia Practitioner Care Coordination [phone]

## 2022-06-25 NOTE — Patient Instructions (Signed)
Visit Information  Thank you for taking time to visit with me today. Please don't hesitate to contact me if I can be of assistance to you.   Following are the goals we discussed today:   Goals Addressed               This Visit's Progress     Patient Stated     Complete Advance Directives (pt-stated)        Care Coordination Interventions: Education provided on the importance of an Advance Directive Mailed Advance Directive packet to the patients home for review Scheduled follow up call to assist with completion as needed      COMPLETED: Complete foot and eye exam (pt-stated)        Care Coordination Interventions: Performed chart review to note patient has not completed a diabetic foot and eye exam this calendar year Discussed with patients daughter Babs Sciara the importance of completing these exams yearly Confirmed patient is active with Dr. Katy Fitch and is scheduled for eye exam in the fall Advised [name] to speak with patients health care provider for a podiatry referral in order to complete foot exam if warranted        Our next appointment is by telephone on 8/18 at 1:00  Please call the care guide team at [phone] if you need to cancel or reschedule your appointment.   If you are experiencing a Mental Health or [place] or need someone to talk to, please go to [facility] [address], [place] [phone])  Patient verbalizes understanding of instructions and care plan provided today and agrees to view in [place]. Active MyChart status and patient understanding of how to access instructions and care plan via MyChart confirmed with patient.     Telephone follow up appointment with care management team member scheduled for:8/18  Daneen Schick, BSW, CDP Social Worker, Certified Dementia Practitioner Care Coordination [phone]

## 2022-06-28 DIAGNOSIS — R32 Unspecified urinary incontinence: Secondary | ICD-10-CM | POA: Diagnosis not present

## 2022-06-28 DIAGNOSIS — I10 Essential (primary) hypertension: Secondary | ICD-10-CM | POA: Diagnosis not present

## 2022-06-28 DIAGNOSIS — H9193 Unspecified hearing loss, bilateral: Secondary | ICD-10-CM | POA: Diagnosis not present

## 2022-06-28 DIAGNOSIS — E1140 Type 2 diabetes mellitus with diabetic neuropathy, unspecified: Secondary | ICD-10-CM | POA: Diagnosis not present

## 2022-06-28 DIAGNOSIS — M19012 Primary osteoarthritis, left shoulder: Secondary | ICD-10-CM | POA: Diagnosis not present

## 2022-06-28 DIAGNOSIS — I69354 Hemiplegia and hemiparesis following cerebral infarction affecting left non-dominant side: Secondary | ICD-10-CM | POA: Diagnosis not present

## 2022-06-30 DIAGNOSIS — H9193 Unspecified hearing loss, bilateral: Secondary | ICD-10-CM | POA: Diagnosis not present

## 2022-06-30 DIAGNOSIS — I69354 Hemiplegia and hemiparesis following cerebral infarction affecting left non-dominant side: Secondary | ICD-10-CM | POA: Diagnosis not present

## 2022-06-30 DIAGNOSIS — R32 Unspecified urinary incontinence: Secondary | ICD-10-CM | POA: Diagnosis not present

## 2022-06-30 DIAGNOSIS — E1140 Type 2 diabetes mellitus with diabetic neuropathy, unspecified: Secondary | ICD-10-CM | POA: Diagnosis not present

## 2022-06-30 DIAGNOSIS — I10 Essential (primary) hypertension: Secondary | ICD-10-CM | POA: Diagnosis not present

## 2022-06-30 DIAGNOSIS — M19012 Primary osteoarthritis, left shoulder: Secondary | ICD-10-CM | POA: Diagnosis not present

## 2022-07-01 DIAGNOSIS — E1140 Type 2 diabetes mellitus with diabetic neuropathy, unspecified: Secondary | ICD-10-CM | POA: Diagnosis not present

## 2022-07-01 DIAGNOSIS — I69354 Hemiplegia and hemiparesis following cerebral infarction affecting left non-dominant side: Secondary | ICD-10-CM | POA: Diagnosis not present

## 2022-07-01 DIAGNOSIS — H9193 Unspecified hearing loss, bilateral: Secondary | ICD-10-CM | POA: Diagnosis not present

## 2022-07-01 DIAGNOSIS — I10 Essential (primary) hypertension: Secondary | ICD-10-CM | POA: Diagnosis not present

## 2022-07-01 DIAGNOSIS — M19012 Primary osteoarthritis, left shoulder: Secondary | ICD-10-CM | POA: Diagnosis not present

## 2022-07-01 DIAGNOSIS — R32 Unspecified urinary incontinence: Secondary | ICD-10-CM | POA: Diagnosis not present

## 2022-07-05 DIAGNOSIS — I69354 Hemiplegia and hemiparesis following cerebral infarction affecting left non-dominant side: Secondary | ICD-10-CM | POA: Diagnosis not present

## 2022-07-05 DIAGNOSIS — E1140 Type 2 diabetes mellitus with diabetic neuropathy, unspecified: Secondary | ICD-10-CM | POA: Diagnosis not present

## 2022-07-05 DIAGNOSIS — I10 Essential (primary) hypertension: Secondary | ICD-10-CM | POA: Diagnosis not present

## 2022-07-05 DIAGNOSIS — R32 Unspecified urinary incontinence: Secondary | ICD-10-CM | POA: Diagnosis not present

## 2022-07-05 DIAGNOSIS — M19012 Primary osteoarthritis, left shoulder: Secondary | ICD-10-CM | POA: Diagnosis not present

## 2022-07-05 DIAGNOSIS — H9193 Unspecified hearing loss, bilateral: Secondary | ICD-10-CM | POA: Diagnosis not present

## 2022-07-11 ENCOUNTER — Emergency Department (HOSPITAL_COMMUNITY): Payer: Medicare Other

## 2022-07-11 ENCOUNTER — Emergency Department (HOSPITAL_COMMUNITY)
Admission: EM | Admit: 2022-07-11 | Discharge: 2022-07-11 | Disposition: A | Discharge status: Home / Self Care | Payer: Medicare Other | Attending: Emergency Medicine | Admitting: Emergency Medicine

## 2022-07-11 ENCOUNTER — Other Ambulatory Visit: Payer: Self-pay

## 2022-07-11 DIAGNOSIS — I10 Essential (primary) hypertension: Secondary | ICD-10-CM | POA: Diagnosis not present

## 2022-07-11 DIAGNOSIS — N189 Chronic kidney disease, unspecified: Secondary | ICD-10-CM | POA: Diagnosis not present

## 2022-07-11 DIAGNOSIS — Z79899 Other long term (current) drug therapy: Secondary | ICD-10-CM | POA: Insufficient documentation

## 2022-07-11 DIAGNOSIS — I129 Hypertensive chronic kidney disease with stage 1 through stage 4 chronic kidney disease, or unspecified chronic kidney disease: Secondary | ICD-10-CM | POA: Insufficient documentation

## 2022-07-11 DIAGNOSIS — K760 Fatty (change of) liver, not elsewhere classified: Secondary | ICD-10-CM | POA: Diagnosis not present

## 2022-07-11 DIAGNOSIS — K5790 Diverticulosis of intestine, part unspecified, without perforation or abscess without bleeding: Secondary | ICD-10-CM | POA: Diagnosis not present

## 2022-07-11 DIAGNOSIS — Z794 Long term (current) use of insulin: Secondary | ICD-10-CM | POA: Insufficient documentation

## 2022-07-11 DIAGNOSIS — R110 Nausea: Secondary | ICD-10-CM | POA: Diagnosis not present

## 2022-07-11 DIAGNOSIS — Z91040 Latex allergy status: Secondary | ICD-10-CM | POA: Diagnosis not present

## 2022-07-11 DIAGNOSIS — E039 Hypothyroidism, unspecified: Secondary | ICD-10-CM | POA: Diagnosis not present

## 2022-07-11 DIAGNOSIS — E1122 Type 2 diabetes mellitus with diabetic chronic kidney disease: Secondary | ICD-10-CM | POA: Diagnosis not present

## 2022-07-11 DIAGNOSIS — R109 Unspecified abdominal pain: Secondary | ICD-10-CM | POA: Diagnosis present

## 2022-07-11 DIAGNOSIS — R1030 Lower abdominal pain, unspecified: Secondary | ICD-10-CM | POA: Diagnosis not present

## 2022-07-11 DIAGNOSIS — R1031 Right lower quadrant pain: Secondary | ICD-10-CM | POA: Diagnosis not present

## 2022-07-11 DIAGNOSIS — R1084 Generalized abdominal pain: Secondary | ICD-10-CM | POA: Diagnosis not present

## 2022-07-11 LAB — URINALYSIS, ROUTINE W REFLEX MICROSCOPIC
Bacteria, UA: NONE SEEN
Bilirubin Urine: NEGATIVE
Glucose, UA: 50 mg/dL — AB
Ketones, ur: NEGATIVE mg/dL
Leukocytes,Ua: NEGATIVE
Nitrite: NEGATIVE
Protein, ur: 100 mg/dL — AB
Specific Gravity, Urine: 1.021 (ref 1.005–1.030)
pH: 7 (ref 5.0–8.0)

## 2022-07-11 LAB — CBC
HCT: 33.3 % — ABNORMAL LOW (ref 36.0–46.0)
Hemoglobin: 11.2 g/dL — ABNORMAL LOW (ref 12.0–15.0)
MCH: 29.6 pg (ref 26.0–34.0)
MCHC: 33.6 g/dL (ref 30.0–36.0)
MCV: 88.1 fL (ref 80.0–100.0)
Platelets: 241 10*3/uL (ref 150–400)
RBC: 3.78 MIL/uL — ABNORMAL LOW (ref 3.87–5.11)
RDW: 14.2 % (ref 11.5–15.5)
WBC: 4.8 10*3/uL (ref 4.0–10.5)
nRBC: 0 % (ref 0.0–0.2)

## 2022-07-11 LAB — COMPREHENSIVE METABOLIC PANEL
ALT: 21 U/L (ref 0–44)
AST: 17 U/L (ref 15–41)
Albumin: 3.8 g/dL (ref 3.5–5.0)
Alkaline Phosphatase: 56 U/L (ref 38–126)
Anion gap: 7 (ref 5–15)
BUN: 25 mg/dL — ABNORMAL HIGH (ref 8–23)
CO2: 25 mmol/L (ref 22–32)
Calcium: 9.7 mg/dL (ref 8.9–10.3)
Chloride: 109 mmol/L (ref 98–111)
Creatinine, Ser: 1.46 mg/dL — ABNORMAL HIGH (ref 0.44–1.00)
GFR, Estimated: 35 mL/min — ABNORMAL LOW (ref >60)
Glucose, Bld: 196 mg/dL — ABNORMAL HIGH (ref 70–99)
Potassium: 4 mmol/L (ref 3.5–5.1)
Sodium: 141 mmol/L (ref 135–145)
Total Bilirubin: 0.6 mg/dL (ref 0.3–1.2)
Total Protein: 7.1 g/dL (ref 6.5–8.1)

## 2022-07-11 LAB — LIPASE, BLOOD: Lipase: 69 U/L — ABNORMAL HIGH (ref 11–51)

## 2022-07-11 MED ORDER — ONDANSETRON HCL 4 MG/2ML IJ SOLN
4.0000 mg | Freq: Once | INTRAMUSCULAR | Status: AC
  Administered 2022-07-11: 4 mg via INTRAVENOUS
  Filled 2022-07-11: qty 2

## 2022-07-11 MED ORDER — DICYCLOMINE HCL 20 MG PO TABS: 20.0000 mg | ORAL_TABLET | Freq: Two times a day (BID) | ORAL | 0 refills | Status: DC

## 2022-07-11 MED ORDER — HYDROCODONE-ACETAMINOPHEN 5-325 MG PO TABS: 1.0000 | ORAL_TABLET | Freq: Three times a day (TID) | ORAL | 0 refills | Status: DC | PRN

## 2022-07-11 MED ORDER — POLYETHYLENE GLYCOL 3350 17 G PO PACK: 17.0000 g | PACK | Freq: Every day | ORAL | Status: DC

## 2022-07-11 MED ORDER — FENTANYL CITRATE PF 50 MCG/ML IJ SOSY
50.0000 ug | PREFILLED_SYRINGE | Freq: Once | INTRAMUSCULAR | Status: AC
  Administered 2022-07-11: 50 ug via INTRAVENOUS
  Filled 2022-07-11: qty 1

## 2022-07-11 MED ORDER — SODIUM CHLORIDE 0.9 % IV BOLUS
500.0000 mL | Freq: Once | INTRAVENOUS | Status: AC
  Administered 2022-07-11: 500 mL via INTRAVENOUS

## 2022-07-11 MED ORDER — OXYCODONE-ACETAMINOPHEN 5-325 MG PO TABS
1.0000 | ORAL_TABLET | Freq: Once | ORAL | Status: AC
  Administered 2022-07-11: 1 via ORAL
  Filled 2022-07-11: qty 1

## 2022-07-11 MED ORDER — IOHEXOL 300 MG/ML  SOLN
100.0000 mL | Freq: Once | INTRAMUSCULAR | Status: AC | PRN
  Administered 2022-07-11: 100 mL via INTRAVENOUS

## 2022-07-11 MED ORDER — DICYCLOMINE HCL 10 MG PO CAPS
10.0000 mg | ORAL_CAPSULE | Freq: Once | ORAL | Status: AC
  Administered 2022-07-11: 10 mg via ORAL
  Filled 2022-07-11: qty 1

## 2022-07-11 MED ORDER — SODIUM CHLORIDE (PF) 0.9 % IJ SOLN
INTRAMUSCULAR | Status: AC
  Filled 2022-07-11: qty 50

## 2022-07-11 NOTE — ED Notes (Signed)
Patient c/o 10/10 abd pain and family at bedside is wanting update from MD.  MD made aware about both.

## 2022-07-11 NOTE — ED Notes (Signed)
Patient's family requesting update from MD.  MD made aware.

## 2022-07-11 NOTE — ED Triage Notes (Signed)
Pt from home c/o RUQ abdominal pain x 2 days, much worse with movement. Pain radiates to back. Nontender to palpation. +bowel sounds all 4 quadrants, LBM 2 days ago normal per pt who states she typically has a BM every 2-3 days at baseline. Hx CVA with left-sided deficit, dementia per report although pt is a/o x 4 on arrival. HTN noted at time of triage with BP 191/95 (123).

## 2022-07-11 NOTE — ED Notes (Signed)
Patient transported to CT 

## 2022-07-11 NOTE — ED Notes (Signed)
Patient given graham crackers and ginger ale per request.

## 2022-07-11 NOTE — ED Provider Notes (Signed)
[facility] DEPT Provider Note   CSN: [id number] Arrival date & time: 07/11/22  1539     History {Add pertinent medical, surgical, social history, OB history to HPI:1} Chief Complaint  Patient presents with   Abdominal Pain    Diana Harper is a 86 y.o. female.  HPI     9 old female comes in with chief complaint of abdominal pain  Home Medications Prior to Admission medications   Medication Sig Start Date End Date Taking? Authorizing Provider  acetaminophen (TYLENOL) 500 MG tablet Take 1,000 mg by mouth every 6 (six) hours as needed for mild pain, headache or fever.   Yes [provider]  albuterol (VENTOLIN HFA) 108 (90 Base) MCG/ACT inhaler Inhale 1-2 puffs into the lungs every 6 (six) hours as needed for wheezing or shortness of breath. 02/04/21  Yes [provider]  amLODipine (NORVASC) 10 MG tablet Take 5 mg by mouth daily with breakfast.   Yes [provider]  BAYER LOW DOSE 81 MG tablet Take 81 mg by mouth daily. Swallow whole.   Yes [provider]  buPROPion (WELLBUTRIN XL) 150 MG 24 hr tablet Take 150 mg by mouth every morning. 04/05/22  Yes [provider]  Calcium Carbonate (CALTRATE 600 PO) Take 600 mg by mouth daily.   Yes [provider]  Cholecalciferol 1000 units capsule Take 1,000 Units by mouth daily.   Yes [provider]  cloNIDine (CATAPRES - DOSED IN MG/24 HR) 0.2 mg/24hr patch Place 0.2 mg onto the skin once a week.   Yes [provider]  Continuous Blood Gluc Sensor (FREESTYLE LIBRE 14 DAY SENSOR) MISC Inject 1 Device into the skin every 14 (fourteen) days.   Yes [provider]  diclofenac Sodium (VOLTAREN) 1 % GEL Apply 4 g topically 4 (four) times daily as needed (for primarily left-sided pain). 06/10/22  Yes [provider]  dicyclomine (BENTYL) 20 MG tablet Take 1 tablet (20 mg total) by mouth 2 (two) times daily. 07/11/22  Yes Pegah Segel,  Saloni Lablanc, MD  Glucagon HCl 1 MG SOLR Inject 1 mg into the skin as needed (for a blood sugar <67).   Yes [provider]  HYDROcodone-acetaminophen (NORCO/VICODIN) 5-325 MG tablet Take 1 tablet by mouth every 8 (eight) hours as needed. 07/11/22  Yes Shivan Hodes, MD  insulin degludec (TRESIBA) 200 UNIT/ML FlexTouch Pen Inject 72 Units into the skin daily.   Yes [provider]  levothyroxine (SYNTHROID) 88 MCG tablet Take 88 mcg by mouth daily before breakfast.   Yes [provider]  loratadine (CLARITIN) 10 MG tablet Take 10 mg daily as needed by mouth (seasonal allergies).    Yes [provider]  nitroGLYCERIN (NITROSTAT) 0.4 MG SL tablet Place 1 tablet (0.4 mg total) under the tongue every 5 (five) minutes as needed for chest pain. 01/20/21 07/11/22 Yes Cantwell, Celeste C, PA-C  NOVOLOG FLEXPEN 100 UNIT/ML FlexPen Inject 5-10 Units into the skin See admin instructions. Inject 5 units into the skin before meals if BGL is 100-150 and increase to 10 units if BGL is greater than 150 05/21/22  Yes [provider]  sertraline (ZOLOFT) 50 MG tablet Take 150 mg by mouth at bedtime.   Yes [provider]  traMADol (ULTRAM) 50 MG tablet Take 50 mg by mouth every 12 (twelve) hours as needed for moderate pain.   Yes [provider]  Diana Harper 200-62.5-25 MCG/ACT AEPB Inhale 1 puff into the lungs daily as  needed (for flares).   Yes [provider]  omeprazole (PRILOSEC) 40 MG capsule Take 1 capsule (40 mg total) by mouth 2 (two) times daily. Patient not taking: Reported on 07/11/2022 03/05/21   Diana, Telford Nab., MD      Allergies    Latex, Onion, Other, Flexeril [cyclobenzaprine], Lisinopril, Metformin and related, Simvastatin, Tetracycline, and Zithromax [azithromycin]    Review of Systems   Review of Systems  Physical Exam Updated Vital Signs BP (!) 182/84   Pulse 68   Temp 99.5 F (37.5 C) (Oral)   Resp (!) 21   Ht 5'  6" (1.676 m)   Wt 72.6 kg   SpO2 97%   BMI 25.82 kg/m  Physical Exam  ED Results / Procedures / Treatments   Labs (all labs ordered are listed, but only abnormal results are displayed) Labs Reviewed  LIPASE, BLOOD - Abnormal; Notable for the following components:      Result Value   Lipase 69 (*)    All other components within normal limits  COMPREHENSIVE METABOLIC PANEL - Abnormal; Notable for the following components:   Glucose, Bld 196 (*)    BUN 25 (*)    Creatinine, Ser 1.46 (*)    GFR, Estimated 35 (*)    All other components within normal limits  CBC - Abnormal; Notable for the following components:   RBC 3.78 (*)    Hemoglobin 11.2 (*)    HCT 33.3 (*)    All other components within normal limits  URINALYSIS, ROUTINE W REFLEX MICROSCOPIC - Abnormal; Notable for the following components:   Color, Urine STRAW (*)    Glucose, UA 50 (*)    Hgb urine dipstick SMALL (*)    Protein, ur 100 (*)    All other components within normal limits    EKG None  Radiology CT ABDOMEN PELVIS W CONTRAST  Result Date: 07/11/2022 CLINICAL DATA:  Right lower quadrant abdominal pain EXAM: CT ABDOMEN AND PELVIS WITH CONTRAST TECHNIQUE: Multidetector CT imaging of the abdomen and pelvis was performed using the standard protocol following bolus administration of intravenous contrast. RADIATION DOSE REDUCTION: This exam was performed according to the departmental dose-optimization program which includes automated exposure control, adjustment of the mA and/or kV according to patient size and/or use of iterative reconstruction technique. CONTRAST:  183m OMNIPAQUE IOHEXOL 300 MG/ML  SOLN COMPARISON:  CT 04/06/2021 FINDINGS: Lower chest: Scarring/atelectasis in the lower lobes. 4 mm pulmonary nodules in the right middle and lower lobe are unchanged from 2017 and should be benign. Hepatobiliary: Hepatic steatosis. No suspicious focal hepatic lesion. Cholecystectomy. Mild dilation of the common bile duct  likely related to reservoir effect post cholecystectomy. Pancreas: Unremarkable. No pancreatic ductal dilatation or surrounding inflammatory changes. Spleen: Normal in size without focal abnormality. Adrenals/Urinary Tract: Adrenal glands are unremarkable. Kidneys are normal, without renal calculi, focal lesion, or hydronephrosis. Bladder is unremarkable. Stomach/Bowel: Stomach is within normal limits. Appendix appears normal. No evidence of bowel wall thickening, distention, or inflammatory changes. Diverticulosis without diverticulitis. Vascular/Lymphatic: Aortoiliac atherosclerotic calcification. No suspicious adenopathy. Reproductive: Unremarkable Other: No free intraperitoneal fluid or air. Musculoskeletal: No acute or significant osseous findings. IMPRESSION: No acute abnormality in the abdomen or pelvis. Hepatic steatosis. Diverticulosis without evidence diverticulitis. Aortic Atherosclerosis (ICD10-I70.0). Electronically Signed   By: TPlacido SouM.D.   On: 07/11/2022 17:47    Procedures Procedures  {Document cardiac monitor, telemetry assessment procedure when appropriate:1}  Medications Ordered in ED Medications  sodium chloride (PF) 0.9 % injection (  Not Given 07/11/22 1731)  polyethylene glycol (MIRALAX / GLYCOLAX) packet 17 g (has no administration in time range)  fentaNYL (SUBLIMAZE) injection 50 mcg (50 mcg Intravenous Given 07/11/22 1638)  ondansetron (ZOFRAN) injection 4 mg (4 mg Intravenous Given 07/11/22 1637)  iohexol (OMNIPAQUE) 300 MG/ML solution 100 mL (100 mLs Intravenous Contrast Given 07/11/22 1716)  sodium chloride 0.9 % bolus 500 mL (0 mLs Intravenous Stopped 07/11/22 1945)  fentaNYL (SUBLIMAZE) injection 50 mcg (50 mcg Intravenous Given 07/11/22 2016)  dicyclomine (BENTYL) capsule 10 mg (10 mg Oral Given 07/11/22 2218)  oxyCODONE-acetaminophen (PERCOCET/ROXICET) 5-325 MG per tablet 1 tablet (1 tablet Oral Given 07/11/22 2218)    ED Course/ Medical Decision Making/ A&P                            Medical Decision Making Amount and/or Complexity of Data Reviewed Labs: ordered. Radiology: ordered.  Risk Prescription drug management.   ***  {Document critical care time when appropriate:1} {Document review of labs and clinical decision tools ie heart score, Chads2Vasc2 etc:1}  {Document your independent review of radiology images, and any outside records:1} {Document your discussion with family members, caretakers, and with consultants:1} {Document social determinants of health affecting pt's care:1} {Document your decision making why or why not admission, treatments were needed:1} Final Clinical Impression(s) / ED Diagnoses Final diagnoses:  Lower abdominal pain    Rx / DC Orders ED Discharge Orders          Ordered    HYDROcodone-acetaminophen (NORCO/VICODIN) 5-325 MG tablet  Every 8 hours PRN        07/11/22 2220    dicyclomine (BENTYL) 20 MG tablet  2 times daily        07/11/22 2220

## 2022-07-11 NOTE — Discharge Instructions (Addendum)
We saw in the ER for abdominal pain.  The CT scan is reassuring.  The urine test, blood work is also normal.  It is unclear why you are having pain.  Take the pain medicine only if the pain is excruciating.  Follow-up with your primary care doctor.  Make sure you take MiraLAX, as the pain medicine will make you constipated.  Take Bentyl for spasms of your colon.  Return to the emergency room if your symptoms get worse.

## 2022-07-13 DIAGNOSIS — I10 Essential (primary) hypertension: Secondary | ICD-10-CM | POA: Diagnosis not present

## 2022-07-13 DIAGNOSIS — F411 Generalized anxiety disorder: Secondary | ICD-10-CM | POA: Diagnosis not present

## 2022-07-13 DIAGNOSIS — I639 Cerebral infarction, unspecified: Secondary | ICD-10-CM | POA: Diagnosis not present

## 2022-07-13 DIAGNOSIS — R634 Abnormal weight loss: Secondary | ICD-10-CM | POA: Diagnosis not present

## 2022-07-13 DIAGNOSIS — Z79899 Other long term (current) drug therapy: Secondary | ICD-10-CM | POA: Diagnosis not present

## 2022-07-13 DIAGNOSIS — R109 Unspecified abdominal pain: Secondary | ICD-10-CM | POA: Diagnosis not present

## 2022-07-13 DIAGNOSIS — R918 Other nonspecific abnormal finding of lung field: Secondary | ICD-10-CM | POA: Diagnosis not present

## 2022-07-13 DIAGNOSIS — R32 Unspecified urinary incontinence: Secondary | ICD-10-CM | POA: Diagnosis not present

## 2022-07-14 DIAGNOSIS — R29898 Other symptoms and signs involving the musculoskeletal system: Secondary | ICD-10-CM | POA: Diagnosis not present

## 2022-07-14 DIAGNOSIS — R109 Unspecified abdominal pain: Secondary | ICD-10-CM | POA: Diagnosis not present

## 2022-07-14 DIAGNOSIS — M47814 Spondylosis without myelopathy or radiculopathy, thoracic region: Secondary | ICD-10-CM | POA: Diagnosis not present

## 2022-07-14 DIAGNOSIS — R339 Retention of urine, unspecified: Secondary | ICD-10-CM | POA: Diagnosis not present

## 2022-07-14 DIAGNOSIS — R911 Solitary pulmonary nodule: Secondary | ICD-10-CM | POA: Diagnosis not present

## 2022-07-14 DIAGNOSIS — R634 Abnormal weight loss: Secondary | ICD-10-CM | POA: Diagnosis not present

## 2022-07-15 ENCOUNTER — Emergency Department (HOSPITAL_COMMUNITY): Payer: Medicare Other

## 2022-07-15 ENCOUNTER — Encounter (HOSPITAL_COMMUNITY): Payer: Self-pay

## 2022-07-15 ENCOUNTER — Inpatient Hospital Stay (HOSPITAL_COMMUNITY)
Admission: EM | Admit: 2022-07-15 | Discharge: 2022-07-17 | DRG: 683 | Disposition: A | Discharge status: Home Health | Payer: Medicare Other | Source: Ambulatory Visit | Attending: Family Medicine | Admitting: Family Medicine

## 2022-07-15 ENCOUNTER — Other Ambulatory Visit: Payer: Self-pay

## 2022-07-15 DIAGNOSIS — F32A Depression, unspecified: Secondary | ICD-10-CM | POA: Diagnosis not present

## 2022-07-15 DIAGNOSIS — Z79899 Other long term (current) drug therapy: Secondary | ICD-10-CM

## 2022-07-15 DIAGNOSIS — I129 Hypertensive chronic kidney disease with stage 1 through stage 4 chronic kidney disease, or unspecified chronic kidney disease: Secondary | ICD-10-CM | POA: Diagnosis present

## 2022-07-15 DIAGNOSIS — N1832 Chronic kidney disease, stage 3b: Secondary | ICD-10-CM | POA: Diagnosis present

## 2022-07-15 DIAGNOSIS — Z794 Long term (current) use of insulin: Secondary | ICD-10-CM | POA: Diagnosis not present

## 2022-07-15 DIAGNOSIS — N139 Obstructive and reflux uropathy, unspecified: Secondary | ICD-10-CM | POA: Diagnosis present

## 2022-07-15 DIAGNOSIS — Z8673 Personal history of transient ischemic attack (TIA), and cerebral infarction without residual deficits: Secondary | ICD-10-CM

## 2022-07-15 DIAGNOSIS — M79602 Pain in left arm: Secondary | ICD-10-CM | POA: Diagnosis present

## 2022-07-15 DIAGNOSIS — Z7982 Long term (current) use of aspirin: Secondary | ICD-10-CM

## 2022-07-15 DIAGNOSIS — E860 Dehydration: Secondary | ICD-10-CM | POA: Diagnosis present

## 2022-07-15 DIAGNOSIS — H409 Unspecified glaucoma: Secondary | ICD-10-CM | POA: Diagnosis not present

## 2022-07-15 DIAGNOSIS — Z841 Family history of disorders of kidney and ureter: Secondary | ICD-10-CM

## 2022-07-15 DIAGNOSIS — Z8371 Family history of colonic polyps: Secondary | ICD-10-CM | POA: Diagnosis not present

## 2022-07-15 DIAGNOSIS — Z91040 Latex allergy status: Secondary | ICD-10-CM

## 2022-07-15 DIAGNOSIS — E559 Vitamin D deficiency, unspecified: Secondary | ICD-10-CM | POA: Diagnosis present

## 2022-07-15 DIAGNOSIS — R339 Retention of urine, unspecified: Secondary | ICD-10-CM | POA: Diagnosis present

## 2022-07-15 DIAGNOSIS — G8929 Other chronic pain: Secondary | ICD-10-CM | POA: Diagnosis not present

## 2022-07-15 DIAGNOSIS — E785 Hyperlipidemia, unspecified: Secondary | ICD-10-CM | POA: Diagnosis present

## 2022-07-15 DIAGNOSIS — K5792 Diverticulitis of intestine, part unspecified, without perforation or abscess without bleeding: Secondary | ICD-10-CM | POA: Diagnosis present

## 2022-07-15 DIAGNOSIS — J449 Chronic obstructive pulmonary disease, unspecified: Secondary | ICD-10-CM | POA: Diagnosis present

## 2022-07-15 DIAGNOSIS — E1165 Type 2 diabetes mellitus with hyperglycemia: Secondary | ICD-10-CM | POA: Diagnosis not present

## 2022-07-15 DIAGNOSIS — Z825 Family history of asthma and other chronic lower respiratory diseases: Secondary | ICD-10-CM | POA: Diagnosis not present

## 2022-07-15 DIAGNOSIS — E119 Type 2 diabetes mellitus without complications: Secondary | ICD-10-CM

## 2022-07-15 DIAGNOSIS — K5732 Diverticulitis of large intestine without perforation or abscess without bleeding: Secondary | ICD-10-CM | POA: Diagnosis present

## 2022-07-15 DIAGNOSIS — N183 Chronic kidney disease, stage 3 unspecified: Secondary | ICD-10-CM | POA: Diagnosis not present

## 2022-07-15 DIAGNOSIS — Z823 Family history of stroke: Secondary | ICD-10-CM | POA: Diagnosis not present

## 2022-07-15 DIAGNOSIS — Z8249 Family history of ischemic heart disease and other diseases of the circulatory system: Secondary | ICD-10-CM | POA: Diagnosis not present

## 2022-07-15 DIAGNOSIS — R109 Unspecified abdominal pain: Secondary | ICD-10-CM | POA: Diagnosis not present

## 2022-07-15 DIAGNOSIS — E039 Hypothyroidism, unspecified: Secondary | ICD-10-CM | POA: Diagnosis present

## 2022-07-15 DIAGNOSIS — R3914 Feeling of incomplete bladder emptying: Secondary | ICD-10-CM | POA: Diagnosis not present

## 2022-07-15 DIAGNOSIS — Z881 Allergy status to other antibiotic agents status: Secondary | ICD-10-CM

## 2022-07-15 DIAGNOSIS — Z8711 Personal history of peptic ulcer disease: Secondary | ICD-10-CM

## 2022-07-15 DIAGNOSIS — D649 Anemia, unspecified: Secondary | ICD-10-CM | POA: Diagnosis present

## 2022-07-15 DIAGNOSIS — N179 Acute kidney failure, unspecified: Secondary | ICD-10-CM | POA: Diagnosis present

## 2022-07-15 DIAGNOSIS — Z833 Family history of diabetes mellitus: Secondary | ICD-10-CM

## 2022-07-15 DIAGNOSIS — I10 Essential (primary) hypertension: Secondary | ICD-10-CM | POA: Diagnosis not present

## 2022-07-15 DIAGNOSIS — Z90710 Acquired absence of both cervix and uterus: Secondary | ICD-10-CM

## 2022-07-15 DIAGNOSIS — E1122 Type 2 diabetes mellitus with diabetic chronic kidney disease: Secondary | ICD-10-CM | POA: Diagnosis present

## 2022-07-15 DIAGNOSIS — Z79890 Hormone replacement therapy (postmenopausal): Secondary | ICD-10-CM

## 2022-07-15 DIAGNOSIS — Z86010 Personal history of colonic polyps: Secondary | ICD-10-CM

## 2022-07-15 DIAGNOSIS — Z888 Allergy status to other drugs, medicaments and biological substances status: Secondary | ICD-10-CM

## 2022-07-15 DIAGNOSIS — I700 Atherosclerosis of aorta: Secondary | ICD-10-CM | POA: Diagnosis not present

## 2022-07-15 LAB — BASIC METABOLIC PANEL
Anion gap: 10 (ref 5–15)
BUN: 44 mg/dL — ABNORMAL HIGH (ref 8–23)
CO2: 25 mmol/L (ref 22–32)
Calcium: 9.5 mg/dL (ref 8.9–10.3)
Chloride: 101 mmol/L (ref 98–111)
Creatinine, Ser: 3.04 mg/dL — ABNORMAL HIGH (ref 0.44–1.00)
GFR, Estimated: 15 mL/min — ABNORMAL LOW (ref >60)
Glucose, Bld: 126 mg/dL — ABNORMAL HIGH (ref 70–99)
Potassium: 3.8 mmol/L (ref 3.5–5.1)
Sodium: 136 mmol/L (ref 135–145)

## 2022-07-15 LAB — CBG MONITORING, ED: Glucose-Capillary: 152 mg/dL — ABNORMAL HIGH (ref 70–99)

## 2022-07-15 LAB — CBC
HCT: 34.2 % — ABNORMAL LOW (ref 36.0–46.0)
Hemoglobin: 11.1 g/dL — ABNORMAL LOW (ref 12.0–15.0)
MCH: 29.6 pg (ref 26.0–34.0)
MCHC: 32.5 g/dL (ref 30.0–36.0)
MCV: 91.2 fL (ref 80.0–100.0)
Platelets: 242 10*3/uL (ref 150–400)
RBC: 3.75 MIL/uL — ABNORMAL LOW (ref 3.87–5.11)
RDW: 14.2 % (ref 11.5–15.5)
WBC: 7.6 10*3/uL (ref 4.0–10.5)
nRBC: 0 % (ref 0.0–0.2)

## 2022-07-15 LAB — URINALYSIS, ROUTINE W REFLEX MICROSCOPIC
Bilirubin Urine: NEGATIVE
Glucose, UA: NEGATIVE mg/dL
Hgb urine dipstick: NEGATIVE
Ketones, ur: NEGATIVE mg/dL
Leukocytes,Ua: NEGATIVE
Nitrite: NEGATIVE
Protein, ur: NEGATIVE mg/dL
Specific Gravity, Urine: 1.018 (ref 1.005–1.030)
pH: 5 (ref 5.0–8.0)

## 2022-07-15 LAB — HEMOGLOBIN A1C
Hgb A1c MFr Bld: 7.1 % — ABNORMAL HIGH (ref 4.8–5.6)
Mean Plasma Glucose: 157.07 mg/dL

## 2022-07-15 LAB — GLUCOSE, CAPILLARY
Glucose-Capillary: 72 mg/dL (ref 70–99)
Glucose-Capillary: 121 mg/dL — ABNORMAL HIGH (ref 70–99)

## 2022-07-15 MED ORDER — SODIUM CHLORIDE 0.9 % IV SOLN: INTRAVENOUS | Status: DC

## 2022-07-15 MED ORDER — ACETAMINOPHEN 325 MG PO TABS: 650.0000 mg | ORAL_TABLET | Freq: Four times a day (QID) | ORAL | Status: DC | PRN

## 2022-07-15 MED ORDER — ONDANSETRON HCL 4 MG/2ML IJ SOLN: 4.0000 mg | Freq: Four times a day (QID) | INTRAMUSCULAR | Status: DC | PRN

## 2022-07-15 MED ORDER — ACETAMINOPHEN 650 MG RE SUPP: 650.0000 mg | Freq: Four times a day (QID) | RECTAL | Status: DC | PRN

## 2022-07-15 MED ORDER — INSULIN ASPART 100 UNIT/ML IJ SOLN: 0.0000 [IU] | Freq: Every day | INTRAMUSCULAR | Status: DC

## 2022-07-15 MED ORDER — INSULIN ASPART 100 UNIT/ML IJ SOLN
0.0000 [IU] | Freq: Three times a day (TID) | INTRAMUSCULAR | Status: DC
  Administered 2022-07-16: 2 [IU] via SUBCUTANEOUS
  Administered 2022-07-17 (×2): 1 [IU] via SUBCUTANEOUS

## 2022-07-15 MED ORDER — SODIUM CHLORIDE 0.9 % IV BOLUS
1000.0000 mL | Freq: Once | INTRAVENOUS | Status: AC
  Administered 2022-07-15: 1000 mL via INTRAVENOUS

## 2022-07-15 MED ORDER — SODIUM CHLORIDE 0.9 % IV SOLN: Freq: Once | INTRAVENOUS | Status: AC

## 2022-07-15 MED ORDER — ONDANSETRON HCL 4 MG PO TABS: 4.0000 mg | ORAL_TABLET | Freq: Four times a day (QID) | ORAL | Status: DC | PRN

## 2022-07-15 MED ORDER — HEPARIN SODIUM (PORCINE) 5000 UNIT/ML IJ SOLN
5000.0000 [IU] | Freq: Three times a day (TID) | INTRAMUSCULAR | Status: DC
  Administered 2022-07-15 – 2022-07-17 (×6): 5000 [IU] via SUBCUTANEOUS
  Filled 2022-07-15 (×6): qty 1

## 2022-07-15 MED ORDER — METRONIDAZOLE 500 MG/100ML IV SOLN
500.0000 mg | Freq: Two times a day (BID) | INTRAVENOUS | Status: DC
  Administered 2022-07-15 – 2022-07-16 (×3): 500 mg via INTRAVENOUS
  Filled 2022-07-15 (×3): qty 100

## 2022-07-15 MED ORDER — SODIUM CHLORIDE 0.9 % IV SOLN
2.0000 g | INTRAVENOUS | Status: DC
  Administered 2022-07-15 – 2022-07-16 (×2): 2 g via INTRAVENOUS
  Filled 2022-07-15 (×2): qty 12.5

## 2022-07-15 MED ORDER — HYDROCODONE-ACETAMINOPHEN 7.5-325 MG PO TABS
1.0000 | ORAL_TABLET | Freq: Four times a day (QID) | ORAL | Status: DC | PRN
  Administered 2022-07-16 – 2022-07-17 (×2): 1 via ORAL
  Filled 2022-07-15 (×2): qty 1

## 2022-07-15 NOTE — ED Triage Notes (Signed)
Pt was sent to the ED by the urologist. Patient reports she has not urinated in about 3 days. Pt was sent here to have some IV fluids and further evaluation. Bladder scan in triage showed 142m. Pt does report some lower abdominal pain and nausea. Family reports minimal oral intake.

## 2022-07-15 NOTE — ED Provider Notes (Signed)
[facility] DEPT Provider Note   CSN: [id number] Arrival date & time: 07/15/22  0957     History  Chief Complaint  Patient presents with   Urinary Retention   Abdominal Pain   Nausea    Diana Harper is a 86 y.o. female.  86 year old female with prior medical history as detailed below presents for evaluation.  Patient reports prior history of urinary retention.  Patient reports decreased urinary output over the last 3 to 4 days.  She denies acute abdominal pain.  She denies dysuria or fever.  The history is provided by the patient and medical records.       Home Medications Prior to Admission medications   Medication Sig Start Date End Date Taking? Authorizing Provider  acetaminophen (TYLENOL) 500 MG tablet Take 1,000 mg by mouth every 6 (six) hours as needed for mild pain, headache or fever.    [provider]  albuterol (VENTOLIN HFA) 108 (90 Base) MCG/ACT inhaler Inhale 1-2 puffs into the lungs every 6 (six) hours as needed for wheezing or shortness of breath. 02/04/21   [provider]  amLODipine (NORVASC) 10 MG tablet Take 5 mg by mouth daily with breakfast.    [provider]  BAYER LOW DOSE 81 MG tablet Take 81 mg by mouth daily. Swallow whole.    [provider]  buPROPion (WELLBUTRIN XL) 150 MG 24 hr tablet Take 150 mg by mouth every morning. 04/05/22   [provider]  Calcium Carbonate (CALTRATE 600 PO) Take 600 mg by mouth daily.    [provider]  Cholecalciferol 1000 units capsule Take 1,000 Units by mouth daily.    [provider]  cloNIDine (CATAPRES - DOSED IN MG/24 HR) 0.2 mg/24hr patch Place 0.2 mg onto the skin once a week.    [provider]  Continuous Blood Gluc Sensor (FREESTYLE LIBRE 14 DAY SENSOR) MISC Inject 1 Device into the skin every 14 (fourteen) days.    [provider]  diclofenac Sodium (VOLTAREN) 1 % GEL Apply 4 g topically 4 (four)  times daily as needed (for primarily left-sided pain). 06/10/22   [provider]  dicyclomine (BENTYL) 20 MG tablet Take 1 tablet (20 mg total) by mouth 2 (two) times daily. 07/11/22   Varney Biles, MD  Glucagon HCl 1 MG SOLR Inject 1 mg into the skin as needed (for a blood sugar <67).    [provider]  HYDROcodone-acetaminophen (NORCO/VICODIN) 5-325 MG tablet Take 1 tablet by mouth every 8 (eight) hours as needed. 07/11/22   Varney Biles, MD  insulin degludec (TRESIBA) 200 UNIT/ML FlexTouch Pen Inject 72 Units into the skin daily.    [provider]  levothyroxine (SYNTHROID) 88 MCG tablet Take 88 mcg by mouth daily before breakfast.    [provider]  loratadine (CLARITIN) 10 MG tablet Take 10 mg daily as needed by mouth (seasonal allergies).     [provider]  nitroGLYCERIN (NITROSTAT) 0.4 MG SL tablet Place 1 tablet (0.4 mg total) under the tongue every 5 (five) minutes as needed for chest pain. 01/20/21 07/11/22  Cantwell, Celeste C, PA-C  NOVOLOG FLEXPEN 100 UNIT/ML FlexPen Inject 5-10 Units into the skin See admin instructions. Inject 5 units into the skin before meals if BGL is 100-150 and increase to 10 units if BGL is greater than 150 05/21/22   [provider]  omeprazole (PRILOSEC) 40 MG capsule Take 1 capsule (40 mg total) by mouth  2 (two) times daily. Patient not taking: Reported on 07/11/2022 03/05/21   Mansouraty, Telford Nab., MD  sertraline (ZOLOFT) 50 MG tablet Take 150 mg by mouth at bedtime.    [provider]  traMADol (ULTRAM) 50 MG tablet Take 50 mg by mouth every 12 (twelve) hours as needed for moderate pain.    [provider]  [name] 200-62.5-25 MCG/ACT AEPB Inhale 1 puff into the lungs daily as needed (for flares).    [provider]      Allergies    Latex, Onion, Other, Flexeril [cyclobenzaprine], Lisinopril, Metformin and related, Simvastatin, Tetracycline, and Zithromax  [azithromycin]    Review of Systems   Review of Systems  All other systems reviewed and are negative.   Physical Exam Updated Vital Signs BP (!) 170/76   Pulse 78   Temp 98 F (36.7 C) (Oral)   Resp 18   Ht '5\' 6"'$  (1.676 m)   Wt 72.6 kg   SpO2 98%   BMI 25.82 kg/m  Physical Exam Vitals and nursing note reviewed.  Constitutional:      General: She is not in acute distress.    Appearance: Normal appearance. She is well-developed.  HENT:     Head: Normocephalic and atraumatic.  Eyes:     Conjunctiva/sclera: Conjunctivae normal.     Pupils: Pupils are equal, round, and reactive to light.  Cardiovascular:     Rate and Rhythm: Normal rate and regular rhythm.     Heart sounds: Normal heart sounds.  Pulmonary:     Effort: Pulmonary effort is normal. No respiratory distress.     Breath sounds: Normal breath sounds.  Abdominal:     General: There is no distension.     Palpations: Abdomen is soft.     Tenderness: There is no abdominal tenderness.  Musculoskeletal:        General: No deformity. Normal range of motion.     Cervical back: Normal range of motion and neck supple.  Skin:    General: Skin is warm and dry.  Neurological:     General: No focal deficit present.     Mental Status: She is alert and oriented to person, place, and time.     ED Results / Procedures / Treatments   Labs (all labs ordered are listed, but only abnormal results are displayed) Labs Reviewed  BASIC METABOLIC PANEL - Abnormal; Notable for the following components:      Result Value   Glucose, Bld 126 (*)    BUN 44 (*)    Creatinine, Ser 3.04 (*)    GFR, Estimated 15 (*)    All other components within normal limits  CBC - Abnormal; Notable for the following components:   RBC 3.75 (*)    Hemoglobin 11.1 (*)    HCT 34.2 (*)    All other components within normal limits  CBG MONITORING, ED - Abnormal; Notable for the following components:   Glucose-Capillary 152 (*)    All other  components within normal limits  URINE CULTURE  URINALYSIS, ROUTINE W REFLEX MICROSCOPIC    EKG EKG Interpretation  Date/Time:  Thursday July 15 2022 11:45:49 EDT Ventricular Rate:  83 PR Interval:  159 QRS Duration: 93 QT Interval:  376 QTC Calculation: 442 R Axis:   39 Text Interpretation: Sinus rhythm Nonspecific repol abnormality, lateral leads Artifact in lead(s) I III aVR aVL aVF V2 Confirmed by Dene Gentry [phone]) on 07/15/2022 12:15:35 PM  Radiology CT ABDOMEN PELVIS WO  CONTRAST  Result Date: 07/15/2022 CLINICAL DATA:  Hervey Ard LEFT lower quadrant pain for 1 week, history of diverticulitis. EXAM: CT ABDOMEN AND PELVIS WITHOUT CONTRAST TECHNIQUE: Multidetector CT imaging of the abdomen and pelvis was performed following the standard protocol without IV contrast. RADIATION DOSE REDUCTION: This exam was performed according to the departmental dose-optimization program which includes automated exposure control, adjustment of the mA and/or kV according to patient size and/or use of iterative reconstruction technique. COMPARISON:  July 11, 2022 FINDINGS: Lower chest: Incidental imaging of the lung bases is unremarkable to the extent evaluated aside from mild basilar atelectasis and or scarring. Hepatobiliary: Smooth hepatic contours. No visible lesion on noncontrast imaging. Post cholecystectomy. Stable appearance of mild extrahepatic biliary duct distension following cholecystectomy. Pancreas: Normal contour.  No signs of inflammation. Spleen: Normal. Adrenals/Urinary Tract: Adrenal glands are normal. Smooth contour of the bilateral kidneys without signs of hydronephrosis or ureteral dilation. Foley catheter decompresses the urinary bladder. No gross stranding about the urinary bladder. No visible calculi. Stomach/Bowel: No acute gastric findings. Small bowel without dilation. The appendix is normal. Scattered colonic diverticulosis in the proximal colon. Moderate diverticular disease of  the sigmoid without query mild stranding about the descending/sigmoid junction in an area of diverticular change. Vascular/Lymphatic: Aortic atherosclerosis. No sign of aneurysm. Smooth contour of the IVC. There is no gastrohepatic or hepatoduodenal ligament lymphadenopathy. No retroperitoneal or mesenteric lymphadenopathy. No pelvic sidewall lymphadenopathy. Reproductive: Unremarkable by CT.  Post hysterectomy. Other: No ascites.  No pneumoperitoneum.  No sign of abscess. Musculoskeletal: Spinal degenerative changes. No acute or destructive bone findings. IMPRESSION: 1. Moderate diverticular disease of the sigmoid. Query mild stranding about the descending/sigmoid junction in an area of diverticular change. Findings could represent very mild diverticulitis. 2. Foley catheter decompresses the urinary bladder. 3. No signs of urinary tract obstruction or calculi. 4. Aortic atherosclerosis. Aortic Atherosclerosis (ICD10-I70.0). Electronically Signed   By: Zetta Bills M.D.   On: 07/15/2022 13:10    Procedures Procedures    Medications Ordered in ED Medications  0.9 %  sodium chloride infusion (has no administration in time range)  sodium chloride 0.9 % bolus 1,000 mL (1,000 mLs Intravenous New Bag/Given 07/15/22 1137)    ED Course/ Medical Decision Making/ A&P                           Medical Decision Making Amount and/or Complexity of Data Reviewed Radiology: ordered.  Risk Prescription drug management. Decision regarding hospitalization.    Medical Screen Complete  This patient presented to the ED with complaint of decreased urinary output.  This complaint involves an extensive number of treatment options. The initial differential diagnosis includes, but is not limited to, dehydration, AKI, metabolic abnormality, etc.  This presentation is: Acute, Self-Limited, Previously Undiagnosed, Uncertain Prognosis, Complicated, Systemic Symptoms, and Threat to Life/Bodily Function  Patient is  presenting with complaint of decreased urine output.  Exam and work-up is suggestive of dehydration with AKI present on labs.  Patient improved with IV fluids.  Patient would benefit from admission for further work-up and treatment.  Hospitalist service made aware of case and will evaluate for admission.   Additional history obtained:  External records from outside sources obtained and reviewed including prior ED visits and prior Inpatient records.    Lab Tests:  I ordered and personally interpreted labs.  The pertinent results include: CBC, BMP, UA, CBG   Imaging Studies ordered:  I ordered imaging studies including CT abdomen  pelvis I independently visualized and interpreted obtained imaging which showed no acute disease I agree with the radiologist interpretation.   Cardiac Monitoring:  The patient was maintained on a cardiac monitor.  I personally viewed and interpreted the cardiac monitor which showed an underlying rhythm of: NSR   Medicines ordered:  I ordered medication including IV fluids for suspected dehydration Reevaluation of the patient after these medicines showed that the patient: improved   Problem List / ED Course:  Dehydration, AKI   Reevaluation:  After the interventions noted above, I reevaluated the patient and found that they have: improved   Disposition:  After consideration of the diagnostic results and the patients response to treatment, I feel that the patent would benefit from admission.          Final Clinical Impression(s) / ED Diagnoses Final diagnoses:  AKI (acute kidney injury) [facility])    Rx / DC Orders ED Discharge Orders     None         Valarie Merino, MD 07/15/22 1340

## 2022-07-15 NOTE — H&P (Signed)
History and Physical    Patient: Diana Harper WJX:[id number] DOB: 05/27/1936 DOA: 07/15/2022 DOS: the patient was seen and examined on 07/15/2022 PCP: [name], MD  Patient coming from: Home  Chief Complaint:  Chief Complaint  Patient presents with   Urinary Retention   Abdominal Pain   Nausea   HPI: Diana Harper is a 86 y.o. female with medical history significant of DM2, HTN, HLD, CKD3, hypothyroidism, COPD, depression. Presenting with decreased urinary outpt and abdominal pain. History is per her daughter. She reports that the patient had abdominal pain startling last week. It was LLQ radiating to the RLQ, It was crampy in nature and episodic. She came to the ED on 8/13. She was diagnosed with possible early mesenteric ischemia and sent home with pain control meds. She was given instructions to take in extra fluids. According to her daughter, she has not had good PO intake since going home. She has not had good UOP since leaving the ED. Family became concerned and brought her to her urologist this morning. It was recommended that she come to the ED for evaluation. She denies any other aggravating or alleviating factors.   Review of Systems: As mentioned in the history of present illness. All other systems reviewed and are negative. Past Medical History:  Diagnosis Date   Anemia    Anxiety    Arthritis    Asthma    Biliary colic    Cholelithiasis    CKD (chronic kidney disease), stage III [facility])    nephrologist--  dr Meredeth Ide   Colon polyps    Diverticulosis of colon    Gastric ulcer    Glaucoma    History of asthma    1980's  no longer problem since 1980's   History of diverticulitis of colon    recurrent--  2014;  2013;  2012   Hyperlipidemia    Hypertension    Hypothyroidism    Insulin dependent type 2 diabetes mellitus ([place])    followed by dr Jenny Reichmann lambeth ([facility])    Murmur    Peripheral neuropathy    Stroke [facility])    Vitamin D deficiency    Wears  hearing aid    bilateral   Wears partial dentures    lower partial and upper full   Past Surgical History:  Procedure Laterality Date   ABDOMINAL HYSTERECTOMY  1970's   CARDIOVASCULAR STRESS TEST  07/30/2009   normal exercise lexiscan nuclear study w/ no ischemia/  normal LV funciton and wall motion, ef 77%   CATARACT EXTRACTION W/ INTRAOCULAR LENS  IMPLANT, BILATERAL  2014   LAPAROSCOPIC CHOLECYSTECTOMY SINGLE SITE WITH INTRAOPERATIVE CHOLANGIOGRAM N/A 08/25/2016   Procedure: LAPAROSCOPIC CHOLECYSTECTOMY WITH INTRAOPERATIVE CHOLANGIOGRAM;  Surgeon: [name], MD;  Location: [place];  Service: General;  Laterality: N/A;   LUMBAR SPINE SURGERY  2014   REMOVAL AXILLA CYST Right 1990's   Social History:  reports that she has never smoked. She has never used smokeless tobacco. She reports that she does not drink alcohol and does not use drugs.  Allergies  Allergen Reactions   Latex Anaphylaxis, Shortness Of Breath and Other (See Comments)    Severe respiratory distress   Onion Diarrhea   Other Other (See Comments)    Patient cannot have TREE NUTS and anything with seeds- History of  diverticulitis    Flexeril [Cyclobenzaprine] Other (See Comments)    Caused agitation   Lisinopril Cough   Metformin And Related Diarrhea   Simvastatin  Hives and Rash   Tetracycline Rash   Zithromax [Azithromycin] Rash    Family History  Problem Relation Age of Onset   Stroke Mother    Colon polyps Mother    Diabetes Mother    Diabetes Father    Kidney failure Father    COPD Father    Colon polyps Sister    Diabetes Sister    Stroke Brother    Diabetes Brother    Heart attack Maternal Grandmother    Heart disease Maternal Grandfather    Diabetes Paternal Grandmother    Stroke Paternal Grandfather    Hypertension Paternal Grandfather    Diabetes Sister    Diabetes Sister    Diabetes Son    Diabetes Daughter    Colon cancer Neg Hx    Esophageal cancer Neg Hx     Inflammatory bowel disease Neg Hx    Liver disease Neg Hx    Pancreatic cancer Neg Hx    Rectal cancer Neg Hx    Stomach cancer Neg Hx     Prior to Admission medications   Medication Sig Start Date End Date Taking? Authorizing Provider  acetaminophen (TYLENOL) 500 MG tablet Take 1,000 mg by mouth every 6 (six) hours as needed for mild pain, headache or fever.    [provider]  albuterol (VENTOLIN HFA) 108 (90 Base) MCG/ACT inhaler Inhale 1-2 puffs into the lungs every 6 (six) hours as needed for wheezing or shortness of breath. 02/04/21   [provider]  amLODipine (NORVASC) 10 MG tablet Take 5 mg by mouth daily with breakfast.    [provider]  BAYER LOW DOSE 81 MG tablet Take 81 mg by mouth daily. Swallow whole.    [provider]  buPROPion (WELLBUTRIN XL) 150 MG 24 hr tablet Take 150 mg by mouth every morning. 04/05/22   [provider]  Calcium Carbonate (CALTRATE 600 PO) Take 600 mg by mouth daily.    [provider]  Cholecalciferol 1000 units capsule Take 1,000 Units by mouth daily.    [provider]  cloNIDine (CATAPRES - DOSED IN MG/24 HR) 0.2 mg/24hr patch Place 0.2 mg onto the skin once a week.    [provider]  Continuous Blood Gluc Sensor (FREESTYLE LIBRE 14 DAY SENSOR) MISC Inject 1 Device into the skin every 14 (fourteen) days.    [provider]  diclofenac Sodium (VOLTAREN) 1 % GEL Apply 4 g topically 4 (four) times daily as needed (for primarily left-sided pain). 06/10/22   [provider]  dicyclomine (BENTYL) 20 MG tablet Take 1 tablet (20 mg total) by mouth 2 (two) times daily. 07/11/22   Varney Biles, MD  Glucagon HCl 1 MG SOLR Inject 1 mg into the skin as needed (for a blood sugar <67).    [provider]  HYDROcodone-acetaminophen (NORCO/VICODIN) 5-325 MG tablet Take 1 tablet by mouth every 8 (eight) hours as needed. 07/11/22   Varney Biles, MD  insulin degludec  (TRESIBA) 200 UNIT/ML FlexTouch Pen Inject 72 Units into the skin daily.    [provider]  levothyroxine (SYNTHROID) 88 MCG tablet Take 88 mcg by mouth daily before breakfast.    [provider]  loratadine (CLARITIN) 10 MG tablet Take 10 mg daily as needed by mouth (seasonal allergies).     [provider]  nitroGLYCERIN (NITROSTAT) 0.4 MG SL tablet Place 1 tablet (0.4 mg total) under the tongue every 5 (five) minutes as needed for chest pain. 01/20/21  07/11/22  Cantwell, Celeste C, PA-C  NOVOLOG FLEXPEN 100 UNIT/ML FlexPen Inject 5-10 Units into the skin See admin instructions. Inject 5 units into the skin before meals if BGL is 100-150 and increase to 10 units if BGL is greater than 150 05/21/22   [provider]  omeprazole (PRILOSEC) 40 MG capsule Take 1 capsule (40 mg total) by mouth 2 (two) times daily. Patient not taking: Reported on 07/11/2022 03/05/21   Mansouraty, Telford Nab., MD  sertraline (ZOLOFT) 50 MG tablet Take 150 mg by mouth at bedtime.    [provider]  traMADol (ULTRAM) 50 MG tablet Take 50 mg by mouth every 12 (twelve) hours as needed for moderate pain.    [provider]  [name] 200-62.5-25 MCG/ACT AEPB Inhale 1 puff into the lungs daily as needed (for flares).    [provider]    Physical Exam: Vitals:   07/15/22 1009 07/15/22 1114 07/15/22 1130 07/15/22 1215  BP:  (!) 167/77 (!) 145/85 (!) 170/76  Pulse:  70 76 78  Resp:  '18 18 18  '$ Temp:  98 F (36.7 C)    TempSrc:  Oral    SpO2:  100% 96% 98%  Weight: 72.6 kg     Height: '5\' 6"'$  (1.676 m)      General: 86 y.o. female resting in bed in NAD Eyes: PERRL, normal sclera ENMT: Nares patent w/o discharge, orophaynx clear, dentition normal, ears w/o discharge/lesions/ulcers Neck: Supple, trachea midline Cardiovascular: RRR, +S1, S2, no g/r, 3/6 SEM, equal pulses throughout Respiratory: CTABL, no w/r/r, normal WOB GI: BS+, ND, mild TTP LLQ w/ deep  palpation, no masses noted, no organomegaly noted MSK: No e/c/c Neuro: A&O x 3, no focal deficits Psyc: Appropriate interaction and affect, calm/cooperative  Data Reviewed:  Lab Results  Component Value Date   NA 136 07/15/2022   K 3.8 07/15/2022   CO2 25 07/15/2022   GLUCOSE 126 (H) 07/15/2022   BUN 44 (H) 07/15/2022   CREATININE 3.04 (H) 07/15/2022   CALCIUM 9.5 07/15/2022   GFRNONAA 15 (L) 07/15/2022   Lab Results  Component Value Date   WBC 7.6 07/15/2022   HGB 11.1 (L) 07/15/2022   HCT 34.2 (L) 07/15/2022   MCV 91.2 07/15/2022   PLT 242 07/15/2022   CT ab/pelvis w/o 1. Moderate diverticular disease of the sigmoid. Query mild stranding about the descending/sigmoid junction in an area of diverticular change. Findings could represent very mild diverticulitis. 2. Foley catheter decompresses the urinary bladder. 3. No signs of urinary tract obstruction or calculi. 4. Aortic atherosclerosis.  Assessment and Plan: AKI on CKD3b Dehydration     - admit to inpt, tele     - no obstruction seen on imaging     - foley placed in ED; ok to keep right now, measure strict I&O     - fluids, encourage PO intake     - watch nephrotoxins     - if not improving by AM, may need nephro consult  LLQ abdominal pain ?mild diverticulitis     - question if diverticulitis is part of her problem here (seen on imaging); can add cefepime/flagyl today and maybe transition to orals in the morning  DM2     - SSI, DM diet, glucose checks  HTN     - continue home regimen when confirmed  Hypothyroidism     - continue home regimen when confirmed  COPD     - continue home regimen when confirmed  Depression     - continue home regimen when confirmed  Normocytic anemia     - at baseline, no evidence of bleed, follow  Advance Care Planning:   Code Status: FULL  Consults: None  Family Communication: w/ dtr by phone  Severity of Illness: The appropriate patient status for this  patient is INPATIENT. Inpatient status is judged to be reasonable and necessary in order to provide the required intensity of service to ensure the patient's safety. The patient's presenting symptoms, physical exam findings, and initial radiographic and laboratory data in the context of their chronic comorbidities is felt to place them at high risk for further clinical deterioration. Furthermore, it is not anticipated that the patient will be medically stable for discharge from the hospital within 2 midnights of admission.   * I certify that at the point of admission it is my clinical judgment that the patient will require inpatient hospital care spanning beyond 2 midnights from the point of admission due to high intensity of service, high risk for further deterioration and high frequency of surveillance required.*  Author: Jonnie Finner, DO 07/15/2022 1:51 PM  For on call review [email or web address].

## 2022-07-15 NOTE — ED Provider Triage Note (Signed)
Emergency Medicine Provider Triage Evaluation Note  Diana Harper , a 86 y.o. female  was evaluated in triage.  Pt complains of urinary retention x 4 days. Was seen in ER and discharged with urology follow up. Urologist evaluated patient and sent her to ER for eval to determine if retention is psychologic or physiologic. Requesting they "fill her with fluids to see if she'll pee on her own".   Review of Systems  Positive: Urinary retention Negative: Abdominal pain, fevers  Physical Exam  BP 117/70 (BP Location: Left Arm)   Pulse 70   Temp 98.3 F (36.8 C) (Oral)   Resp 18   Ht '5\' 6"'$  (1.676 m)   Wt 72.6 kg   SpO2 99%   BMI 25.82 kg/m  Gen:   Awake, no distress   Resp:  Normal effort  MSK:   Moves extremities without difficulty  Other:    Medical Decision Making  Medically screening exam initiated at 10:33 AM.  Appropriate orders placed.  Diana Harper was informed that the remainder of the evaluation will be completed by another provider, this initial triage assessment does not replace that evaluation, and the importance of remaining in the ED until their evaluation is complete.     Diana Harper T, PA-C 07/15/22 1035

## 2022-07-16 DIAGNOSIS — E039 Hypothyroidism, unspecified: Secondary | ICD-10-CM | POA: Diagnosis not present

## 2022-07-16 DIAGNOSIS — K5792 Diverticulitis of intestine, part unspecified, without perforation or abscess without bleeding: Secondary | ICD-10-CM

## 2022-07-16 DIAGNOSIS — E119 Type 2 diabetes mellitus without complications: Secondary | ICD-10-CM | POA: Diagnosis not present

## 2022-07-16 DIAGNOSIS — N179 Acute kidney failure, unspecified: Secondary | ICD-10-CM | POA: Diagnosis not present

## 2022-07-16 LAB — COMPREHENSIVE METABOLIC PANEL
ALT: 18 U/L (ref 0–44)
AST: 19 U/L (ref 15–41)
Albumin: 3.5 g/dL (ref 3.5–5.0)
Alkaline Phosphatase: 56 U/L (ref 38–126)
Anion gap: 5 (ref 5–15)
BUN: 28 mg/dL — ABNORMAL HIGH (ref 8–23)
CO2: 26 mmol/L (ref 22–32)
Calcium: 9.1 mg/dL (ref 8.9–10.3)
Chloride: 113 mmol/L — ABNORMAL HIGH (ref 98–111)
Creatinine, Ser: 1.83 mg/dL — ABNORMAL HIGH (ref 0.44–1.00)
GFR, Estimated: 27 mL/min — ABNORMAL LOW (ref >60)
Glucose, Bld: 92 mg/dL (ref 70–99)
Potassium: 3.7 mmol/L (ref 3.5–5.1)
Sodium: 144 mmol/L (ref 135–145)
Total Bilirubin: 0.5 mg/dL (ref 0.3–1.2)
Total Protein: 6.3 g/dL — ABNORMAL LOW (ref 6.5–8.1)

## 2022-07-16 LAB — GLUCOSE, CAPILLARY
Glucose-Capillary: 77 mg/dL (ref 70–99)
Glucose-Capillary: 168 mg/dL — ABNORMAL HIGH (ref 70–99)
Glucose-Capillary: 101 mg/dL — ABNORMAL HIGH (ref 70–99)
Glucose-Capillary: 170 mg/dL — ABNORMAL HIGH (ref 70–99)

## 2022-07-16 LAB — CBC
HCT: 31.3 % — ABNORMAL LOW (ref 36.0–46.0)
Hemoglobin: 10.4 g/dL — ABNORMAL LOW (ref 12.0–15.0)
MCH: 30.1 pg (ref 26.0–34.0)
MCHC: 33.2 g/dL (ref 30.0–36.0)
MCV: 90.5 fL (ref 80.0–100.0)
Platelets: 226 10*3/uL (ref 150–400)
RBC: 3.46 MIL/uL — ABNORMAL LOW (ref 3.87–5.11)
RDW: 14.1 % (ref 11.5–15.5)
WBC: 5.8 10*3/uL (ref 4.0–10.5)
nRBC: 0 % (ref 0.0–0.2)

## 2022-07-16 LAB — URINE CULTURE: Culture: NO GROWTH

## 2022-07-16 MED ORDER — ASPIRIN 81 MG PO TBEC
81.0000 mg | DELAYED_RELEASE_TABLET | Freq: Every day | ORAL | Status: DC
  Administered 2022-07-16 – 2022-07-17 (×2): 81 mg via ORAL
  Filled 2022-07-16 (×2): qty 1

## 2022-07-16 MED ORDER — VITAMIN D 25 MCG (1000 UNIT) PO TABS
1000.0000 [IU] | ORAL_TABLET | Freq: Every day | ORAL | Status: DC
  Administered 2022-07-16 – 2022-07-17 (×2): 1000 [IU] via ORAL
  Filled 2022-07-16 (×2): qty 1

## 2022-07-16 MED ORDER — FLUTICASONE FUROATE-VILANTEROL 200-25 MCG/ACT IN AEPB
1.0000 | INHALATION_SPRAY | Freq: Every day | RESPIRATORY_TRACT | Status: DC
  Administered 2022-07-16 – 2022-07-17 (×2): 1 via RESPIRATORY_TRACT
  Filled 2022-07-16: qty 28

## 2022-07-16 MED ORDER — BUPROPION HCL ER (XL) 150 MG PO TB24
150.0000 mg | ORAL_TABLET | Freq: Every day | ORAL | Status: DC
  Administered 2022-07-16 – 2022-07-17 (×2): 150 mg via ORAL
  Filled 2022-07-16 (×2): qty 1

## 2022-07-16 MED ORDER — SERTRALINE HCL 50 MG PO TABS
150.0000 mg | ORAL_TABLET | Freq: Every day | ORAL | Status: DC
  Administered 2022-07-16: 150 mg via ORAL
  Filled 2022-07-16: qty 1

## 2022-07-16 MED ORDER — UMECLIDINIUM BROMIDE 62.5 MCG/ACT IN AEPB
1.0000 | INHALATION_SPRAY | Freq: Every day | RESPIRATORY_TRACT | Status: DC
  Administered 2022-07-16 – 2022-07-17 (×2): 1 via RESPIRATORY_TRACT
  Filled 2022-07-16: qty 7

## 2022-07-16 MED ORDER — ALBUTEROL SULFATE (2.5 MG/3ML) 0.083% IN NEBU: 3.0000 mL | INHALATION_SOLUTION | Freq: Four times a day (QID) | RESPIRATORY_TRACT | Status: DC | PRN

## 2022-07-16 MED ORDER — AMOXICILLIN-POT CLAVULANATE 500-125 MG PO TABS
1.0000 | ORAL_TABLET | Freq: Two times a day (BID) | ORAL | Status: DC
Start: 2022-07-16 — End: 2022-07-17
  Administered 2022-07-16 – 2022-07-17 (×2): 500 mg via ORAL
  Filled 2022-07-16 (×3): qty 1

## 2022-07-16 MED ORDER — LEVOTHYROXINE SODIUM 88 MCG PO TABS
88.0000 ug | ORAL_TABLET | Freq: Every day | ORAL | Status: DC
  Administered 2022-07-16 – 2022-07-17 (×2): 88 ug via ORAL
  Filled 2022-07-16 (×2): qty 1

## 2022-07-16 NOTE — Progress Notes (Signed)
  Progress Note   Patient: Diana Harper QQV:[id number] DOB: [birth year] DOA: 07/15/2022     1 DOS: the patient was seen and examined on 07/16/2022   Brief hospital course: 86 year old woman PMH including diabetes, COPD presented with little urine output over 3 days abdominal pain, crampy and episodic.  Poor oral intake.  Admitted for acute kidney injury superimposed on CKD stage IIIb, Foley catheter placed; mild diverticulitis.  Assessment and Plan: AKI on CKD3b, baseline creatinine around 1.7-2 -- Etiology unclear, no obstruction on imaging.  Foley catheter was placed in the emergency department --Unclear whether she had urinary obstruction or severe volume depletion. --Renal function much improved today, continue IV fluids, check BMP in AM. --Voiding trial in a.m.   Mild diverticulitis with associated abdominal pain. --Much improved, no current pain.  Changed to oral antibiotics.   Diabetes mellitus type 2 --CBG stable, currently just on sliding scale insulin although she is on long-acting insulin at home. --Resume long-acting insulin as blood sugars increase.   Hypothyroidism -- Continue levothyroxine.   COPD --Stable.  Continue bronchodilators.   Mild normocytic anemia --Stable.  Follow-up as an outpatient.  Chronic left arm pain, followed in the outpatient setting.     Subjective:  Feels better No abd pain today No vomiting Ate breakfast and lunch Reports 3 days of difficulty voiding at home Reports left should and arm pain  Physical Exam: Vitals:   07/16/22 0202 07/16/22 0615 07/16/22 0859 07/16/22 1340  BP: 134/75 (!) 155/64  (!) 142/75  Pulse: 80 74  75  Resp: '14 14  16  '$ Temp: 99.4 F (37.4 C) 98.6 F (37 C)  98.6 F (37 C)  TempSrc: Oral Oral  Oral  SpO2: 98% 98% 98% 96%  Weight:      Height:       Physical Exam Vitals reviewed.  Constitutional:      General: She is not in acute distress.    Appearance: She is not ill-appearing or toxic-appearing.   Cardiovascular:     Rate and Rhythm: Normal rate and regular rhythm.     Heart sounds: No murmur heard. Pulmonary:     Effort: Pulmonary effort is normal. No respiratory distress.     Breath sounds: No wheezing, rhonchi or rales.  Abdominal:     General: There is no distension.     Palpations: Abdomen is soft.     Tenderness: There is no abdominal tenderness. There is no guarding.  Neurological:     Mental Status: She is alert.  Psychiatric:        Mood and Affect: Mood normal.        Behavior: Behavior normal.     Data Reviewed:  Creatinine down to 1.83 Hgb stable 10.4  Family Communication: none  Disposition: Status is: Inpatient Remains inpatient appropriate because: AKI, diverticulitis  Planned Discharge Destination: Home    Time spent: 25 minutes  Author: Murray Hodgkins, MD 07/16/2022 7:40 PM  For on call review [email or web address].

## 2022-07-16 NOTE — TOC Progression Note (Signed)
Transition of Care [facility]) - Progression Note    Patient Details  Name: Diana Harper MRN: [id number] Date of Birth: 01-21-1936  Transition of Care [facility]) CM/SW Contact  Purcell Mouton, RN Phone Number: 07/16/2022, 1:33 PM  Clinical Narrative:    Spoke with pt's daughter [facility] concerning [place]. PT/OT is from [place]. MD will need HHPT/OT orders at discharge. Thanks   Expected Discharge Plan: [place] Barriers to Discharge: No Barriers Identified  Expected Discharge Plan and Services Expected Discharge Plan: [place]   Discharge Planning Services: CM Consult   Living arrangements for the past 2 months: Single Family Home                           HH Arranged: PT, OT [facility] Agency: [place] Date [place]: 07/16/22 Time [place]: 1332 Representative spoke with at [place]: [place] Determinants of Health ([place]) Interventions    Readmission Risk Interventions     No data to display

## 2022-07-16 NOTE — Hospital Course (Signed)
86 year old woman PMH including diabetes, COPD presented with little urine output over 3 days abdominal pain, crampy and episodic.  Poor oral intake.  Admitted for acute kidney injury superimposed on CKD stage IIIb, Foley catheter placed; mild diverticulitis.

## 2022-07-17 DIAGNOSIS — E1165 Type 2 diabetes mellitus with hyperglycemia: Secondary | ICD-10-CM | POA: Diagnosis not present

## 2022-07-17 DIAGNOSIS — N179 Acute kidney failure, unspecified: Secondary | ICD-10-CM | POA: Diagnosis not present

## 2022-07-17 DIAGNOSIS — E1122 Type 2 diabetes mellitus with diabetic chronic kidney disease: Secondary | ICD-10-CM

## 2022-07-17 DIAGNOSIS — K5792 Diverticulitis of intestine, part unspecified, without perforation or abscess without bleeding: Secondary | ICD-10-CM | POA: Diagnosis not present

## 2022-07-17 DIAGNOSIS — E119 Type 2 diabetes mellitus without complications: Secondary | ICD-10-CM

## 2022-07-17 DIAGNOSIS — N183 Chronic kidney disease, stage 3 unspecified: Secondary | ICD-10-CM

## 2022-07-17 LAB — BASIC METABOLIC PANEL
Anion gap: 6 (ref 5–15)
BUN: 21 mg/dL (ref 8–23)
CO2: 25 mmol/L (ref 22–32)
Calcium: 8.5 mg/dL — ABNORMAL LOW (ref 8.9–10.3)
Chloride: 109 mmol/L (ref 98–111)
Creatinine, Ser: 1.4 mg/dL — ABNORMAL HIGH (ref 0.44–1.00)
GFR, Estimated: 37 mL/min — ABNORMAL LOW (ref >60)
Glucose, Bld: 158 mg/dL — ABNORMAL HIGH (ref 70–99)
Potassium: 3.6 mmol/L (ref 3.5–5.1)
Sodium: 140 mmol/L (ref 135–145)

## 2022-07-17 LAB — GLUCOSE, CAPILLARY
Glucose-Capillary: 149 mg/dL — ABNORMAL HIGH (ref 70–99)
Glucose-Capillary: 158 mg/dL — ABNORMAL HIGH (ref 70–99)
Glucose-Capillary: 140 mg/dL — ABNORMAL HIGH (ref 70–99)

## 2022-07-17 MED ORDER — HYDROCODONE-ACETAMINOPHEN 7.5-325 MG PO TABS
1.0000 | ORAL_TABLET | ORAL | Status: DC | PRN
  Administered 2022-07-17 (×2): 1 via ORAL
  Filled 2022-07-17 (×2): qty 1

## 2022-07-17 MED ORDER — AMOXICILLIN-POT CLAVULANATE 500-125 MG PO TABS: 1.0000 | ORAL_TABLET | Freq: Two times a day (BID) | ORAL | 0 refills | Status: DC

## 2022-07-17 NOTE — Discharge Summary (Signed)
Physician Discharge Summary   Patient: Diana Harper MRN: [id number] DOB: 08-17-1936  Admit date:     07/15/2022  Discharge date: 07/17/22  Discharge Physician: Murray Hodgkins   PCP: [name], MD   Recommendations at discharge:   CKD3b   Mild diverticulitis with associated abdominal pain.   Chronic left arm pain, followed in the outpatient setting.  Discharge Diagnoses: Principal Problem:   AKI (acute kidney injury) ([place]) Active Problems:   Acute diverticulitis   CKD stage 3 due to type 2 diabetes mellitus ([place])   Hypothyroidism   COPD (chronic obstructive pulmonary disease) ([place])   DM type 2 (diabetes mellitus, type 2) ([place])  Resolved Problems:   * No resolved hospital problems. *  Hospital Course: 86 year old woman PMH including diabetes, COPD presented with little urine output over 3 days abdominal pain, crampy and episodic.  Poor oral intake.  Admitted for acute kidney injury superimposed on CKD stage IIIb, Foley catheter placed; mild diverticulitis.  Renal function steadily improved, Foley catheter removed, mild diverticulitis clinically resolved, tolerating diet.  Has chronic left shoulder and upper arm pain, follow-up in the outpatient setting.  AKI on CKD3b, baseline creatinine around 1.7-2 -- Etiology unclear, no obstruction on imaging.  Foley catheter was placed in the emergency department --Unclear whether she had urinary obstruction or severe volume depletion. --Renal function back to baseline.  Voiding trial today.   Mild diverticulitis with associated abdominal pain. --Much improved, no current pain.  Clinically resolved.  Changed to oral antibiotics based on renal function.   Diabetes mellitus type 2 -- Stable.   Hypothyroidism -- Continue levothyroxine.   COPD --Stable.  Continue bronchodilators.   Mild normocytic anemia --Stable.  Follow-up as an outpatient.   Chronic left arm pain, followed in the outpatient setting.          Consultants:  None Procedures performed:  None  Disposition: Home health Diet recommendation:  Regular diet DISCHARGE MEDICATION: Allergies as of 07/17/2022       Reactions   Latex Anaphylaxis, Shortness Of Breath, Other (See Comments)   Severe respiratory distress   Onion Diarrhea   Other Other (See Comments)   Patient cannot have TREE NUTS and anything with seeds- History of  diverticulitis    Flexeril [cyclobenzaprine] Other (See Comments)   Caused agitation   Lisinopril Cough   Metformin And Related Diarrhea   Simvastatin Hives, Rash   Tetracycline Rash   Zithromax [azithromycin] Rash        Medication List     STOP taking these medications    dicyclomine 20 MG tablet Commonly known as: BENTYL   HYDROcodone-acetaminophen 5-325 MG tablet Commonly known as: NORCO/VICODIN   traMADol 50 MG tablet Commonly known as: ULTRAM       TAKE these medications    acetaminophen 500 MG tablet Commonly known as: TYLENOL Take 1,000 mg by mouth every 6 (six) hours as needed for mild pain, headache or fever.   albuterol 108 (90 Base) MCG/ACT inhaler Commonly known as: VENTOLIN HFA Inhale 1-2 puffs into the lungs every 6 (six) hours as needed for wheezing or shortness of breath.   amLODipine 10 MG tablet Commonly known as: NORVASC Take 5 mg by mouth daily with breakfast.   amoxicillin-clavulanate 500-125 MG tablet Commonly known as: AUGMENTIN Take 1 tablet (500 mg total) by mouth 2 (two) times daily.   Bayer Low Dose 81 MG tablet Generic drug: aspirin EC Take 81 mg by mouth daily.   buPROPion 150 MG 24  hr tablet Commonly known as: WELLBUTRIN XL Take 150 mg by mouth daily.   CALTRATE 600 PO Take 600 mg by mouth daily.   Cholecalciferol 25 MCG (1000 UT) capsule Take 1,000 Units by mouth daily.   cloNIDine 0.2 mg/24hr patch Commonly known as: CATAPRES - Dosed in mg/24 hr Place 0.2 mg onto the skin once a week.   diclofenac Sodium 1 % Gel Commonly known  as: VOLTAREN Apply 4 g topically 4 (four) times daily as needed (left side pain).   FreeStyle Libre 14 Day Sensor Misc Inject 1 Device into the skin every 14 (fourteen) days.   Glucagon HCl 1 MG Solr Inject 1 mg into the skin as needed (for a blood sugar <67).   insulin degludec 200 UNIT/ML FlexTouch Pen Commonly known as: TRESIBA Inject 72 Units into the skin daily.   levothyroxine 88 MCG tablet Commonly known as: SYNTHROID Take 88 mcg by mouth daily before breakfast.   loratadine 10 MG tablet Commonly known as: CLARITIN Take 10 mg daily as needed by mouth (seasonal allergies).   nitroGLYCERIN 0.4 MG SL tablet Commonly known as: NITROSTAT Place 1 tablet (0.4 mg total) under the tongue every 5 (five) minutes as needed for chest pain.   NovoLOG FlexPen 100 UNIT/ML FlexPen Generic drug: insulin aspart Inject 5-10 Units into the skin See admin instructions. Inject 5 units into the skin before meals if BGL is 100-150 and increase to 10 units if BGL is greater than 150   omeprazole 40 MG capsule Commonly known as: PRILOSEC Take 1 capsule (40 mg total) by mouth 2 (two) times daily. What changed:  when to take this reasons to take this   sertraline 50 MG tablet Commonly known as: ZOLOFT Take 150 mg by mouth at bedtime.   Trelegy Ellipta 200-62.5-25 MCG/ACT Aepb Generic drug: Fluticasone-Umeclidin-Vilant Inhale 1 puff into the lungs daily as needed (for flares).        Follow-up Information     [name], MD. Schedule an appointment as soon as possible for a visit in 1 week(s).   Specialty: Family Medicine Contact information: [address] [place] [address] [phone]                Feels ok, no abd pain, tolerating diet, no n/v Reports chronic left shoulder pain  Discharge Exam: Filed Weights   07/15/22 1009  Weight: 72.6 kg   Physical Exam Vitals reviewed.  Constitutional:      General: She is not in acute distress.     Appearance: She is not ill-appearing or toxic-appearing.  Cardiovascular:     Rate and Rhythm: Normal rate and regular rhythm.     Heart sounds: No murmur heard. Pulmonary:     Effort: Pulmonary effort is normal. No respiratory distress.     Breath sounds: No wheezing, rhonchi or rales.  Abdominal:     General: There is no distension.     Palpations: Abdomen is soft.     Tenderness: There is no abdominal tenderness.  Musculoskeletal:     Comments: No LUE edema, no tenderness of arm, able to move arm, can keep arm lifted, sensation grossly intact  Neurological:     Mental Status: She is alert.  Psychiatric:        Mood and Affect: Mood normal.        Behavior: Behavior normal.      Condition at discharge: good  The results of significant diagnostics from this hospitalization (including imaging, microbiology, ancillary  and laboratory) are listed below for reference.   Imaging Studies: CT ABDOMEN PELVIS WO CONTRAST  Result Date: 07/15/2022 CLINICAL DATA:  Hervey Ard LEFT lower quadrant pain for 1 week, history of diverticulitis. EXAM: CT ABDOMEN AND PELVIS WITHOUT CONTRAST TECHNIQUE: Multidetector CT imaging of the abdomen and pelvis was performed following the standard protocol without IV contrast. RADIATION DOSE REDUCTION: This exam was performed according to the departmental dose-optimization program which includes automated exposure control, adjustment of the mA and/or kV according to patient size and/or use of iterative reconstruction technique. COMPARISON:  July 11, 2022 FINDINGS: Lower chest: Incidental imaging of the lung bases is unremarkable to the extent evaluated aside from mild basilar atelectasis and or scarring. Hepatobiliary: Smooth hepatic contours. No visible lesion on noncontrast imaging. Post cholecystectomy. Stable appearance of mild extrahepatic biliary duct distension following cholecystectomy. Pancreas: Normal contour.  No signs of inflammation. Spleen: Normal.  Adrenals/Urinary Tract: Adrenal glands are normal. Smooth contour of the bilateral kidneys without signs of hydronephrosis or ureteral dilation. Foley catheter decompresses the urinary bladder. No gross stranding about the urinary bladder. No visible calculi. Stomach/Bowel: No acute gastric findings. Small bowel without dilation. The appendix is normal. Scattered colonic diverticulosis in the proximal colon. Moderate diverticular disease of the sigmoid without query mild stranding about the descending/sigmoid junction in an area of diverticular change. Vascular/Lymphatic: Aortic atherosclerosis. No sign of aneurysm. Smooth contour of the IVC. There is no gastrohepatic or hepatoduodenal ligament lymphadenopathy. No retroperitoneal or mesenteric lymphadenopathy. No pelvic sidewall lymphadenopathy. Reproductive: Unremarkable by CT.  Post hysterectomy. Other: No ascites.  No pneumoperitoneum.  No sign of abscess. Musculoskeletal: Spinal degenerative changes. No acute or destructive bone findings. IMPRESSION: 1. Moderate diverticular disease of the sigmoid. Query mild stranding about the descending/sigmoid junction in an area of diverticular change. Findings could represent very mild diverticulitis. 2. Foley catheter decompresses the urinary bladder. 3. No signs of urinary tract obstruction or calculi. 4. Aortic atherosclerosis. Aortic Atherosclerosis (ICD10-I70.0). Electronically Signed   By: Zetta Bills M.D.   On: 07/15/2022 13:10   CT ABDOMEN PELVIS W CONTRAST  Result Date: 07/11/2022 CLINICAL DATA:  Right lower quadrant abdominal pain EXAM: CT ABDOMEN AND PELVIS WITH CONTRAST TECHNIQUE: Multidetector CT imaging of the abdomen and pelvis was performed using the standard protocol following bolus administration of intravenous contrast. RADIATION DOSE REDUCTION: This exam was performed according to the departmental dose-optimization program which includes automated exposure control, adjustment of the mA and/or kV  according to patient size and/or use of iterative reconstruction technique. CONTRAST:  186m OMNIPAQUE IOHEXOL 300 MG/ML  SOLN COMPARISON:  CT 04/06/2021 FINDINGS: Lower chest: Scarring/atelectasis in the lower lobes. 4 mm pulmonary nodules in the right middle and lower lobe are unchanged from 2017 and should be benign. Hepatobiliary: Hepatic steatosis. No suspicious focal hepatic lesion. Cholecystectomy. Mild dilation of the common bile duct likely related to reservoir effect post cholecystectomy. Pancreas: Unremarkable. No pancreatic ductal dilatation or surrounding inflammatory changes. Spleen: Normal in size without focal abnormality. Adrenals/Urinary Tract: Adrenal glands are unremarkable. Kidneys are normal, without renal calculi, focal lesion, or hydronephrosis. Bladder is unremarkable. Stomach/Bowel: Stomach is within normal limits. Appendix appears normal. No evidence of bowel wall thickening, distention, or inflammatory changes. Diverticulosis without diverticulitis. Vascular/Lymphatic: Aortoiliac atherosclerotic calcification. No suspicious adenopathy. Reproductive: Unremarkable Other: No free intraperitoneal fluid or air. Musculoskeletal: No acute or significant osseous findings. IMPRESSION: No acute abnormality in the abdomen or pelvis. Hepatic steatosis. Diverticulosis without evidence diverticulitis. Aortic Atherosclerosis (ICD10-I70.0). Electronically Signed   By: TDorothea Ogle  Stutzman M.D.   On: 07/11/2022 17:47    Microbiology: Results for orders placed or performed during the hospital encounter of 07/15/22  Urine Culture     Status: None   Collection Time: 07/15/22 10:34 AM   Specimen: Urine, Clean Catch  Result Value Ref Range Status   Specimen Description   Final    URINE, CLEAN CATCH Performed at [facility], [place] [address], [place], [place] [address]    Special Requests   Final    NONE Performed at [facility], [place] [address] [place],  [place] [address]    Culture   Final    NO GROWTH Performed at [facility], [place] [address], [place], [place] [address]    Report Status 07/16/2022 FINAL  Final    Labs: CBC: Recent Labs  Lab 07/11/22 1613 07/15/22 1042 07/16/22 0555  WBC 4.8 7.6 5.8  HGB 11.2* 11.1* 10.4*  HCT 33.3* 34.2* 31.3*  MCV 88.1 91.2 90.5  PLT 241 242 423   Basic Metabolic Panel: Recent Labs  Lab 07/11/22 1613 07/15/22 1042 07/16/22 0555 07/17/22 0548  NA 141 136 144 140  K 4.0 3.8 3.7 3.6  CL 109 101 113* 109  CO2 '25 25 26 25  '$ GLUCOSE 196* 126* 92 158*  BUN 25* 44* 28* 21  CREATININE 1.46* 3.04* 1.83* 1.40*  CALCIUM 9.7 9.5 9.1 8.5*   Liver Function Tests: Recent Labs  Lab 07/11/22 1613 07/16/22 0555  AST 17 19  ALT 21 18  ALKPHOS 56 56  BILITOT 0.6 0.5  PROT 7.1 6.3*  ALBUMIN 3.8 3.5   CBG: Recent Labs  Lab 07/16/22 0732 07/16/22 1115 07/16/22 1610 07/16/22 1956 07/17/22 0747  GLUCAP 77 168* 101* 170* 149*    Discharge time spent: greater than 30 minutes.  Signed: Murray Hodgkins, MD [facility] 07/17/2022

## 2022-07-17 NOTE — TOC Transition Note (Signed)
Transition of Care [facility]) - CM/SW Discharge Note   Patient Details  Name: Diana Harper MRN: [id number] Date of Birth: 07/19/1936  [facility] of [facility] CM/SW Contact:  Ross Ludwig, LCSW Phone Number: 07/17/2022, 1:19 PM   Clinical Narrative:     Patient will be going home with home health through [facility].  CSW signing off please reconsult with any other social work needs, home health agency has been notified of planned discharge.   Final next level of care: [place] Barriers to Discharge: Barriers Resolved   Patient Goals and CMS Choice Patient states their goals for this hospitalization and ongoing recovery are:: To return back home with home health. CMS Medicare.gov Compare Post Acute Care list provided to:: Patient Choice offered to / list presented to : Adult Children  Discharge Placement                       Discharge Plan and Services   Discharge Planning Services: CM Consult                      HH Arranged: OT, PT HH Agency: [place] Date [facility] Agency Contacted: 07/16/22 Time [place]: [place] Representative spoke with at [place]: [place] Determinants of Health ([place]) Interventions     Readmission Risk Interventions     No data to display

## 2022-07-19 DIAGNOSIS — E039 Hypothyroidism, unspecified: Secondary | ICD-10-CM | POA: Diagnosis not present

## 2022-07-19 DIAGNOSIS — E1122 Type 2 diabetes mellitus with diabetic chronic kidney disease: Secondary | ICD-10-CM | POA: Diagnosis not present

## 2022-07-19 DIAGNOSIS — E1165 Type 2 diabetes mellitus with hyperglycemia: Secondary | ICD-10-CM | POA: Diagnosis not present

## 2022-07-19 DIAGNOSIS — N1832 Chronic kidney disease, stage 3b: Secondary | ICD-10-CM | POA: Diagnosis not present

## 2022-07-19 DIAGNOSIS — I129 Hypertensive chronic kidney disease with stage 1 through stage 4 chronic kidney disease, or unspecified chronic kidney disease: Secondary | ICD-10-CM | POA: Diagnosis not present

## 2022-07-19 DIAGNOSIS — D638 Anemia in other chronic diseases classified elsewhere: Secondary | ICD-10-CM | POA: Diagnosis not present

## 2022-07-19 DIAGNOSIS — N179 Acute kidney failure, unspecified: Secondary | ICD-10-CM | POA: Diagnosis not present

## 2022-07-21 ENCOUNTER — Ambulatory Visit: Payer: Self-pay

## 2022-07-21 NOTE — Patient Outreach (Signed)
  Care Coordination   Follow Up Visit Note   07/21/2022 Name: Ladina Shutters MRN: [id number] DOB: Jul 26, 1936  Kaydra Borgen is a 86 y.o. year old female who sees [name], MD for primary care. I  spoke with patients daughter [name] by phone  What matters to the patients health and wellness today?  Increasing water intake    Goals Addressed               This Visit's Progress     Patient Stated     Complete Advance Directives (pt-stated)        Care Coordination Interventions: Education provided on the importance of an Advance Directive Determined patient has yet to receive mailed resource, will follow up over the next month      Other     COMPLETED: Better understanding of Disease Management        Care Coordination Interventions: Performed chart review to note recent inpatient stay due to AKI Discussed plan to refer the patient to RN Care Manager to assist with disease management and education Determined [name] is trying to encourage water intake but having trouble getting patient to drink Discussed also encouraging apple sauce, jello, and popsicles in order to increase hydration Collaboration with [place] Manager to request she contact the patient and her daughter to complete a clinical assessment        SDOH assessments and interventions completed:  No     Care Coordination Interventions Activated:  Yes  Care Coordination Interventions:  Yes, provided   Follow up plan: Referral made to RN Care Manager    Encounter Outcome:  Pt. Visit Completed   Daneen Schick, BSW, CDP Social Worker, Certified Dementia Practitioner Care Coordination [phone]

## 2022-07-21 NOTE — Patient Instructions (Signed)
Visit Information  Thank you for taking time to visit with me today. Please don't hesitate to contact me if I can be of assistance to you.   Following are the goals we discussed today:   Goals Addressed               This Visit's Progress     Patient Stated     Complete Advance Directives (pt-stated)        Care Coordination Interventions: Education provided on the importance of an Advance Directive Determined patient has yet to receive mailed resource, will follow up over the next month      Other     COMPLETED: Better understanding of Disease Management        Care Coordination Interventions: Performed chart review to note recent inpatient stay due to AKI Discussed plan to refer the patient to RN Care Manager to assist with disease management and education Determined [name] is trying to encourage water intake but having trouble getting patient to drink Discussed also encouraging apple sauce, jello, and popsicles in order to increase hydration Collaboration with [place] to request she contact the patient and her daughter to complete a clinical assessment        Our next appointment is by telephone on 9/13 at 11:00  Please call the care guide team at [phone] if you need to cancel or reschedule your appointment.   If you are experiencing a Mental Health or [place] or need someone to talk to, please call 1-800-273-TALK (toll free, 24 hour hotline)  Patient verbalizes understanding of instructions and care plan provided today and agrees to view in [place]. Active MyChart status and patient understanding of how to access instructions and care plan via MyChart confirmed with patient.     Telephone follow up appointment with care management team member scheduled for:9/13  Daneen Schick, BSW, CDP Social Worker, Certified Dementia Practitioner Care Coordination [phone]

## 2022-07-24 ENCOUNTER — Encounter (HOSPITAL_COMMUNITY): Payer: Self-pay

## 2022-07-24 ENCOUNTER — Emergency Department (HOSPITAL_COMMUNITY): Payer: Medicare Other

## 2022-07-24 ENCOUNTER — Inpatient Hospital Stay (HOSPITAL_COMMUNITY)
Admission: EM | Admit: 2022-07-24 | Discharge: 2022-07-28 | DRG: 372 | Disposition: A | Discharge status: Home Health | Payer: Medicare Other | Attending: Internal Medicine | Admitting: Internal Medicine

## 2022-07-24 DIAGNOSIS — Z9181 History of falling: Secondary | ICD-10-CM | POA: Diagnosis not present

## 2022-07-24 DIAGNOSIS — K5792 Diverticulitis of intestine, part unspecified, without perforation or abscess without bleeding: Secondary | ICD-10-CM | POA: Diagnosis not present

## 2022-07-24 DIAGNOSIS — F32A Depression, unspecified: Secondary | ICD-10-CM | POA: Diagnosis present

## 2022-07-24 DIAGNOSIS — Z974 Presence of external hearing-aid: Secondary | ICD-10-CM

## 2022-07-24 DIAGNOSIS — E1122 Type 2 diabetes mellitus with diabetic chronic kidney disease: Secondary | ICD-10-CM | POA: Diagnosis present

## 2022-07-24 DIAGNOSIS — Z6825 Body mass index (BMI) 25.0-25.9, adult: Secondary | ICD-10-CM

## 2022-07-24 DIAGNOSIS — Z794 Long term (current) use of insulin: Secondary | ICD-10-CM

## 2022-07-24 DIAGNOSIS — A0472 Enterocolitis due to Clostridium difficile, not specified as recurrent: Principal | ICD-10-CM

## 2022-07-24 DIAGNOSIS — Z7982 Long term (current) use of aspirin: Secondary | ICD-10-CM | POA: Diagnosis not present

## 2022-07-24 DIAGNOSIS — Z79890 Hormone replacement therapy (postmenopausal): Secondary | ICD-10-CM

## 2022-07-24 DIAGNOSIS — I10 Essential (primary) hypertension: Secondary | ICD-10-CM | POA: Diagnosis present

## 2022-07-24 DIAGNOSIS — Z888 Allergy status to other drugs, medicaments and biological substances status: Secondary | ICD-10-CM | POA: Diagnosis not present

## 2022-07-24 DIAGNOSIS — E119 Type 2 diabetes mellitus without complications: Secondary | ICD-10-CM

## 2022-07-24 DIAGNOSIS — Z881 Allergy status to other antibiotic agents status: Secondary | ICD-10-CM

## 2022-07-24 DIAGNOSIS — Z8673 Personal history of transient ischemic attack (TIA), and cerebral infarction without residual deficits: Secondary | ICD-10-CM

## 2022-07-24 DIAGNOSIS — E039 Hypothyroidism, unspecified: Secondary | ICD-10-CM | POA: Diagnosis present

## 2022-07-24 DIAGNOSIS — M19012 Primary osteoarthritis, left shoulder: Secondary | ICD-10-CM | POA: Diagnosis not present

## 2022-07-24 DIAGNOSIS — R5381 Other malaise: Secondary | ICD-10-CM | POA: Diagnosis present

## 2022-07-24 DIAGNOSIS — D649 Anemia, unspecified: Secondary | ICD-10-CM | POA: Diagnosis present

## 2022-07-24 DIAGNOSIS — H9193 Unspecified hearing loss, bilateral: Secondary | ICD-10-CM | POA: Diagnosis not present

## 2022-07-24 DIAGNOSIS — D539 Nutritional anemia, unspecified: Secondary | ICD-10-CM | POA: Diagnosis present

## 2022-07-24 DIAGNOSIS — Z91040 Latex allergy status: Secondary | ICD-10-CM | POA: Diagnosis not present

## 2022-07-24 DIAGNOSIS — N183 Chronic kidney disease, stage 3 unspecified: Secondary | ICD-10-CM | POA: Diagnosis present

## 2022-07-24 DIAGNOSIS — Z7951 Long term (current) use of inhaled steroids: Secondary | ICD-10-CM | POA: Diagnosis not present

## 2022-07-24 DIAGNOSIS — N1832 Chronic kidney disease, stage 3b: Secondary | ICD-10-CM | POA: Diagnosis present

## 2022-07-24 DIAGNOSIS — E785 Hyperlipidemia, unspecified: Secondary | ICD-10-CM | POA: Diagnosis present

## 2022-07-24 DIAGNOSIS — E876 Hypokalemia: Secondary | ICD-10-CM | POA: Diagnosis present

## 2022-07-24 DIAGNOSIS — Z91018 Allergy to other foods: Secondary | ICD-10-CM

## 2022-07-24 DIAGNOSIS — Z90710 Acquired absence of both cervix and uterus: Secondary | ICD-10-CM

## 2022-07-24 DIAGNOSIS — K5732 Diverticulitis of large intestine without perforation or abscess without bleeding: Secondary | ICD-10-CM | POA: Diagnosis present

## 2022-07-24 DIAGNOSIS — J449 Chronic obstructive pulmonary disease, unspecified: Secondary | ICD-10-CM | POA: Diagnosis present

## 2022-07-24 DIAGNOSIS — Z79899 Other long term (current) drug therapy: Secondary | ICD-10-CM | POA: Diagnosis not present

## 2022-07-24 DIAGNOSIS — E663 Overweight: Secondary | ICD-10-CM | POA: Diagnosis present

## 2022-07-24 DIAGNOSIS — Z961 Presence of intraocular lens: Secondary | ICD-10-CM | POA: Diagnosis present

## 2022-07-24 DIAGNOSIS — R197 Diarrhea, unspecified: Secondary | ICD-10-CM

## 2022-07-24 DIAGNOSIS — Z8719 Personal history of other diseases of the digestive system: Secondary | ICD-10-CM

## 2022-07-24 DIAGNOSIS — Z9049 Acquired absence of other specified parts of digestive tract: Secondary | ICD-10-CM

## 2022-07-24 DIAGNOSIS — M1990 Unspecified osteoarthritis, unspecified site: Secondary | ICD-10-CM | POA: Diagnosis not present

## 2022-07-24 DIAGNOSIS — E1142 Type 2 diabetes mellitus with diabetic polyneuropathy: Secondary | ICD-10-CM | POA: Diagnosis present

## 2022-07-24 DIAGNOSIS — F419 Anxiety disorder, unspecified: Secondary | ICD-10-CM | POA: Diagnosis not present

## 2022-07-24 DIAGNOSIS — E1165 Type 2 diabetes mellitus with hyperglycemia: Secondary | ICD-10-CM

## 2022-07-24 DIAGNOSIS — I129 Hypertensive chronic kidney disease with stage 1 through stage 4 chronic kidney disease, or unspecified chronic kidney disease: Secondary | ICD-10-CM | POA: Diagnosis present

## 2022-07-24 DIAGNOSIS — K760 Fatty (change of) liver, not elsewhere classified: Secondary | ICD-10-CM | POA: Diagnosis not present

## 2022-07-24 DIAGNOSIS — R109 Unspecified abdominal pain: Secondary | ICD-10-CM | POA: Diagnosis not present

## 2022-07-24 DIAGNOSIS — R32 Unspecified urinary incontinence: Secondary | ICD-10-CM | POA: Diagnosis not present

## 2022-07-24 DIAGNOSIS — E1140 Type 2 diabetes mellitus with diabetic neuropathy, unspecified: Secondary | ICD-10-CM | POA: Diagnosis not present

## 2022-07-24 DIAGNOSIS — I700 Atherosclerosis of aorta: Secondary | ICD-10-CM | POA: Diagnosis not present

## 2022-07-24 DIAGNOSIS — Z833 Family history of diabetes mellitus: Secondary | ICD-10-CM

## 2022-07-24 DIAGNOSIS — I69354 Hemiplegia and hemiparesis following cerebral infarction affecting left non-dominant side: Secondary | ICD-10-CM | POA: Diagnosis not present

## 2022-07-24 DIAGNOSIS — Z8711 Personal history of peptic ulcer disease: Secondary | ICD-10-CM

## 2022-07-24 DIAGNOSIS — I959 Hypotension, unspecified: Secondary | ICD-10-CM | POA: Diagnosis not present

## 2022-07-24 DIAGNOSIS — Z8249 Family history of ischemic heart disease and other diseases of the circulatory system: Secondary | ICD-10-CM

## 2022-07-24 DIAGNOSIS — H409 Unspecified glaucoma: Secondary | ICD-10-CM | POA: Diagnosis present

## 2022-07-24 DIAGNOSIS — Z793 Long term (current) use of hormonal contraceptives: Secondary | ICD-10-CM

## 2022-07-24 DIAGNOSIS — R1084 Generalized abdominal pain: Secondary | ICD-10-CM | POA: Diagnosis not present

## 2022-07-24 LAB — CBC WITH DIFFERENTIAL/PLATELET
Abs Immature Granulocytes: 0.15 10*3/uL — ABNORMAL HIGH (ref 0.00–0.07)
Basophils Absolute: 0 10*3/uL (ref 0.0–0.1)
Basophils Relative: 0 %
Eosinophils Absolute: 0 10*3/uL (ref 0.0–0.5)
Eosinophils Relative: 0 %
HCT: 32.8 % — ABNORMAL LOW (ref 36.0–46.0)
Hemoglobin: 10.7 g/dL — ABNORMAL LOW (ref 12.0–15.0)
Immature Granulocytes: 1 %
Lymphocytes Relative: 6 %
Lymphs Abs: 0.9 10*3/uL (ref 0.7–4.0)
MCH: 30.1 pg (ref 26.0–34.0)
MCHC: 32.6 g/dL (ref 30.0–36.0)
MCV: 92.1 fL (ref 80.0–100.0)
Monocytes Absolute: 0.6 10*3/uL (ref 0.1–1.0)
Monocytes Relative: 4 %
Neutro Abs: 12.3 10*3/uL — ABNORMAL HIGH (ref 1.7–7.7)
Neutrophils Relative %: 89 %
Platelets: 230 10*3/uL (ref 150–400)
RBC: 3.56 MIL/uL — ABNORMAL LOW (ref 3.87–5.11)
RDW: 14.6 % (ref 11.5–15.5)
WBC: 14 10*3/uL — ABNORMAL HIGH (ref 4.0–10.5)
nRBC: 0 % (ref 0.0–0.2)

## 2022-07-24 LAB — LIPASE, BLOOD: Lipase: 26 U/L (ref 11–51)

## 2022-07-24 LAB — I-STAT CHEM 8, ED
BUN: 32 mg/dL — ABNORMAL HIGH (ref 8–23)
Calcium, Ion: 1.24 mmol/L (ref 1.15–1.40)
Chloride: 102 mmol/L (ref 98–111)
Creatinine, Ser: 1.9 mg/dL — ABNORMAL HIGH (ref 0.44–1.00)
Glucose, Bld: 119 mg/dL — ABNORMAL HIGH (ref 70–99)
HCT: 33 % — ABNORMAL LOW (ref 36.0–46.0)
Hemoglobin: 11.2 g/dL — ABNORMAL LOW (ref 12.0–15.0)
Potassium: 3.5 mmol/L (ref 3.5–5.1)
Sodium: 139 mmol/L (ref 135–145)
TCO2: 25 mmol/L (ref 22–32)

## 2022-07-24 LAB — GLUCOSE, CAPILLARY
Glucose-Capillary: 106 mg/dL — ABNORMAL HIGH (ref 70–99)
Glucose-Capillary: 124 mg/dL — ABNORMAL HIGH (ref 70–99)

## 2022-07-24 LAB — LACTIC ACID, PLASMA: Lactic Acid, Venous: 1.2 mmol/L (ref 0.5–1.9)

## 2022-07-24 MED ORDER — LACTATED RINGERS IV SOLN: INTRAVENOUS | Status: DC

## 2022-07-24 MED ORDER — PANTOPRAZOLE SODIUM 40 MG PO TBEC
40.0000 mg | DELAYED_RELEASE_TABLET | Freq: Every day | ORAL | Status: DC
  Administered 2022-07-25 – 2022-07-28 (×4): 40 mg via ORAL
  Filled 2022-07-24 (×4): qty 1

## 2022-07-24 MED ORDER — ONDANSETRON HCL 4 MG/2ML IJ SOLN
4.0000 mg | Freq: Once | INTRAMUSCULAR | Status: AC
  Administered 2022-07-24: 4 mg via INTRAVENOUS
  Filled 2022-07-24: qty 2

## 2022-07-24 MED ORDER — METRONIDAZOLE 500 MG/100ML IV SOLN
500.0000 mg | Freq: Two times a day (BID) | INTRAVENOUS | Status: DC
  Administered 2022-07-24 – 2022-07-25 (×2): 500 mg via INTRAVENOUS
  Filled 2022-07-24 (×2): qty 100

## 2022-07-24 MED ORDER — ACETAMINOPHEN 650 MG RE SUPP: 650.0000 mg | Freq: Four times a day (QID) | RECTAL | Status: DC | PRN

## 2022-07-24 MED ORDER — SODIUM CHLORIDE 0.9 % IV SOLN: 2.0000 g | INTRAVENOUS | Status: DC

## 2022-07-24 MED ORDER — ACETAMINOPHEN 325 MG PO TABS
650.0000 mg | ORAL_TABLET | Freq: Four times a day (QID) | ORAL | Status: DC | PRN
  Administered 2022-07-25 – 2022-07-26 (×4): 650 mg via ORAL
  Filled 2022-07-24 (×4): qty 2

## 2022-07-24 MED ORDER — ONDANSETRON HCL 4 MG/2ML IJ SOLN: 4.0000 mg | Freq: Four times a day (QID) | INTRAMUSCULAR | Status: DC | PRN

## 2022-07-24 MED ORDER — METRONIDAZOLE 500 MG/100ML IV SOLN: 500.0000 mg | Freq: Two times a day (BID) | INTRAVENOUS | Status: DC

## 2022-07-24 MED ORDER — SODIUM CHLORIDE 0.9 % IV SOLN
2.0000 g | Freq: Once | INTRAVENOUS | Status: AC
  Administered 2022-07-24: 2 g via INTRAVENOUS
  Filled 2022-07-24: qty 20

## 2022-07-24 MED ORDER — PANTOPRAZOLE SODIUM 40 MG IV SOLR
40.0000 mg | Freq: Once | INTRAVENOUS | Status: AC
  Administered 2022-07-24: 40 mg via INTRAVENOUS
  Filled 2022-07-24: qty 10

## 2022-07-24 MED ORDER — PROCHLORPERAZINE EDISYLATE 10 MG/2ML IJ SOLN
5.0000 mg | Freq: Once | INTRAMUSCULAR | Status: DC
  Filled 2022-07-24: qty 2

## 2022-07-24 MED ORDER — MORPHINE SULFATE (PF) 4 MG/ML IV SOLN
4.0000 mg | Freq: Once | INTRAVENOUS | Status: AC
  Administered 2022-07-24: 4 mg via INTRAVENOUS
  Filled 2022-07-24: qty 1

## 2022-07-24 MED ORDER — METRONIDAZOLE 500 MG/100ML IV SOLN
500.0000 mg | Freq: Once | INTRAVENOUS | Status: AC
  Administered 2022-07-24: 500 mg via INTRAVENOUS
  Filled 2022-07-24: qty 100

## 2022-07-24 MED ORDER — MORPHINE SULFATE (PF) 4 MG/ML IV SOLN: 4.0000 mg | INTRAVENOUS | Status: DC | PRN

## 2022-07-24 MED ORDER — MORPHINE SULFATE (PF) 4 MG/ML IV SOLN: 4.0000 mg | Freq: Once | INTRAVENOUS | Status: DC

## 2022-07-24 MED ORDER — HYDROMORPHONE HCL 1 MG/ML IJ SOLN
0.5000 mg | Freq: Once | INTRAMUSCULAR | Status: AC
  Administered 2022-07-24: 0.5 mg via INTRAVENOUS
  Filled 2022-07-24: qty 0.5

## 2022-07-24 MED ORDER — LACTATED RINGERS IV BOLUS
1000.0000 mL | Freq: Once | INTRAVENOUS | Status: AC
  Administered 2022-07-24: 1000 mL via INTRAVENOUS

## 2022-07-24 MED ORDER — ONDANSETRON HCL 4 MG PO TABS: 4.0000 mg | ORAL_TABLET | Freq: Four times a day (QID) | ORAL | Status: DC | PRN

## 2022-07-24 MED ORDER — HYDROMORPHONE HCL 1 MG/ML IJ SOLN: 0.5000 mg | INTRAMUSCULAR | Status: DC | PRN

## 2022-07-24 MED ORDER — SODIUM CHLORIDE 0.9 % IV BOLUS
1000.0000 mL | Freq: Once | INTRAVENOUS | Status: AC
  Administered 2022-07-24: 1000 mL via INTRAVENOUS

## 2022-07-24 NOTE — ED Provider Notes (Signed)
[facility] DEPT Provider Note   CSN: [id number] Arrival date & time: 07/24/22  1227     History  Chief Complaint  Patient presents with   Abdominal Pain    Diana Harper is a 86 y.o. female.  Patient is an 86 year old female with a past medical history of diabetes, CVA, CKD presenting to the emergency department with abdominal pain.  Patient was admitted to the hospital earlier in the week with diverticulitis and AKI.  She states that the time of discharge she was doing well but over the last few days she has had worsening abdominal pain.  She states she was sent home on Percocet and took the last dose last night but is still have had increasing pain.  She states the pain is across her lower abdomen.  She states that she has been nauseous but denies any vomiting.  She denies any fevers but reports feeling chilled.  She states that she has also had several episodes of watery diarrhea that started yesterday.  She states she has been taking the Flagyl as prescribed.  The history is provided by the patient and the spouse.  Abdominal Pain      Home Medications Prior to Admission medications   Medication Sig Start Date End Date Taking? Authorizing Provider  acetaminophen (TYLENOL) 500 MG tablet Take 1,000 mg by mouth every 6 (six) hours as needed for mild pain, headache or fever.    [provider]  albuterol (VENTOLIN HFA) 108 (90 Base) MCG/ACT inhaler Inhale 1-2 puffs into the lungs every 6 (six) hours as needed for wheezing or shortness of breath. 02/04/21   [provider]  amLODipine (NORVASC) 10 MG tablet Take 5 mg by mouth daily with breakfast. Patient not taking: Reported on 07/15/2022    [provider]  amoxicillin-clavulanate (AUGMENTIN) 500-125 MG tablet Take 1 tablet (500 mg total) by mouth 2 (two) times daily. 07/17/22   Samuella Cota, MD  BAYER LOW DOSE 81 MG tablet Take 81 mg by mouth daily.    [provider]  buPROPion (WELLBUTRIN XL) 150 MG 24 hr tablet Take 150 mg by mouth daily. 04/05/22   [provider]  Calcium Carbonate (CALTRATE 600 PO) Take 600 mg by mouth daily.    [provider]  Cholecalciferol 1000 units capsule Take 1,000 Units by mouth daily.    [provider]  cloNIDine (CATAPRES - DOSED IN MG/24 HR) 0.2 mg/24hr patch Place 0.2 mg onto the skin once a week.    [provider]  Continuous Blood Gluc Sensor (FREESTYLE LIBRE 14 DAY SENSOR) MISC Inject 1 Device into the skin every 14 (fourteen) days.    [provider]  diclofenac Sodium (VOLTAREN) 1 % GEL Apply 4 g topically 4 (four) times daily as needed (left side pain). 06/10/22   [provider]  Glucagon HCl 1 MG SOLR Inject 1 mg into the skin as needed (for a blood sugar <67).    [provider]  insulin degludec (TRESIBA) 200 UNIT/ML FlexTouch Pen Inject 72 Units into the skin daily.    [provider]  levothyroxine (SYNTHROID) 88 MCG tablet Take 88 mcg by mouth daily before breakfast.    [provider]  loratadine (CLARITIN) 10 MG tablet Take 10 mg daily as needed by mouth (seasonal allergies).     [provider]  nitroGLYCERIN (NITROSTAT) 0.4 MG SL tablet Place 1 tablet (0.4 mg total) under the tongue every 5 (five) minutes  as needed for chest pain. 01/20/21 07/11/22  Cantwell, Celeste C, PA-C  NOVOLOG FLEXPEN 100 UNIT/ML FlexPen Inject 5-10 Units into the skin See admin instructions. Inject 5 units into the skin before meals if BGL is 100-150 and increase to 10 units if BGL is greater than 150 05/21/22   [provider]  omeprazole (PRILOSEC) 40 MG capsule Take 1 capsule (40 mg total) by mouth 2 (two) times daily. Patient taking differently: Take 40 mg by mouth daily as needed (for heartburn). 03/05/21   Mansouraty, Telford Nab., MD  sertraline (ZOLOFT) 50 MG tablet Take 150 mg by mouth at bedtime.    [provider]  [name] 200-62.5-25 MCG/ACT AEPB Inhale 1 puff into the lungs daily as needed (for flares).    [provider]      Allergies    Latex, Onion, Other, Flexeril [cyclobenzaprine], Lisinopril, Metformin and related, Simvastatin, Tetracycline, and Zithromax [azithromycin]    Review of Systems   Review of Systems  Gastrointestinal:  Positive for abdominal pain.    Physical Exam Updated Vital Signs BP 114/88   Pulse 85   Temp 98.3 F (36.8 C) (Oral)   Resp 17   Ht '5\' 6"'$  (1.676 m)   Wt 72.6 kg   SpO2 98%   BMI 25.82 kg/m  Physical Exam Vitals and nursing note reviewed.  Constitutional:      Comments: Shivering under multiple blankets, mildly uncomfortable appearing  HENT:     Head: Normocephalic and atraumatic.     Mouth/Throat:     Mouth: Mucous membranes are moist.     Pharynx: Oropharynx is clear.  Eyes:     Extraocular Movements: Extraocular movements intact.  Cardiovascular:     Rate and Rhythm: Normal rate and regular rhythm.     Heart sounds: Normal heart sounds.  Pulmonary:     Effort: Pulmonary effort is normal.     Breath sounds: Normal breath sounds.  Abdominal:     General: Abdomen is flat.     Palpations: Abdomen is soft.     Tenderness: There is abdominal tenderness (Diffuse lower abdomen, no rebound or guarding).  Skin:    General: Skin is warm and dry.  Neurological:     General: No focal deficit present.     Mental Status: She is alert and oriented to person, place, and time.  Psychiatric:        Mood and Affect: Mood normal.        Behavior: Behavior normal.     ED Results / Procedures / Treatments   Labs (all labs ordered are listed, but only abnormal results are displayed) Labs Reviewed  CBC WITH DIFFERENTIAL/PLATELET - Abnormal; Notable for the following components:      Result Value   WBC 14.0 (*)    RBC 3.56 (*)    Hemoglobin 10.7 (*)    HCT 32.8 (*)    Neutro Abs 12.3 (*)    Abs Immature Granulocytes 0.15 (*)     All other components within normal limits  I-STAT CHEM 8, ED - Abnormal; Notable for the following components:   BUN 32 (*)    Creatinine, Ser 1.90 (*)    Glucose, Bld 119 (*)    Hemoglobin 11.2 (*)    HCT 33.0 (*)    All other components within normal limits  C DIFFICILE QUICK SCREEN W PCR REFLEX    LIPASE, BLOOD  LACTIC ACID, PLASMA  URINALYSIS, ROUTINE W REFLEX MICROSCOPIC  LACTIC ACID,  PLASMA    EKG None  Radiology CT ABDOMEN PELVIS WO CONTRAST  Result Date: 07/24/2022 CLINICAL DATA:  Abdominal pain, acute, nonlocalized h/o diverticulitis EXAM: CT ABDOMEN AND PELVIS WITHOUT CONTRAST TECHNIQUE: Multidetector CT imaging of the abdomen and pelvis was performed following the standard protocol without IV contrast. RADIATION DOSE REDUCTION: This exam was performed according to the departmental dose-optimization program which includes automated exposure control, adjustment of the mA and/or kV according to patient size and/or use of iterative reconstruction technique. COMPARISON:  07/15/2022 FINDINGS: Lower chest: No acute abnormality. Hepatobiliary: No focal liver abnormality is seen. Status post cholecystectomy. No biliary dilatation. Pancreas: Unremarkable. No pancreatic ductal dilatation or surrounding inflammatory changes. Spleen: Normal in size without focal abnormality. Adrenals/Urinary Tract: Adrenal glands are unremarkable. Kidneys are normal, without renal calculi, focal lesion, or hydronephrosis. Bladder is unremarkable. Stomach/Bowel: Findings of acute diverticulitis involving the sigmoid colon and distal descending colon with wall thickening, pericolonic fat stranding and trace fluid. No dilated loops of bowel. Stomach within normal limits. Vascular/Lymphatic: Aortic atherosclerosis. No enlarged abdominal or pelvic lymph nodes. Reproductive: Status post hysterectomy. No adnexal masses. Other: No ascites. No organized abdominopelvic fluid collection. No pneumoperitoneum. Musculoskeletal:  No new or acute bony findings. Scoliotic thoracolumbar curvature with multilevel spondylosis. IMPRESSION: 1. Acute uncomplicated diverticulitis of the sigmoid colon and distal descending colon. 2. Aortic atherosclerosis (ICD10-I70.0). Electronically Signed   By: Davina Poke D.O.   On: 07/24/2022 13:30    Procedures Procedures    Medications Ordered in ED Medications  cefTRIAXone (ROCEPHIN) 2 g in sodium chloride 0.9 % 100 mL IVPB (2 g Intravenous New Bag/Given 07/24/22 1415)    And  metroNIDAZOLE (FLAGYL) IVPB 500 mg (has no administration in time range)  morphine (PF) 4 MG/ML injection 4 mg (has no administration in time range)  ondansetron (ZOFRAN) injection 4 mg (has no administration in time range)  sodium chloride 0.9 % bolus 1,000 mL (1,000 mLs Intravenous New Bag/Given 07/24/22 1414)    ED Course/ Medical Decision Making/ A&P Clinical Course as of 07/24/22 1449  Sat Jul 24, 2022  1449 Patient accepted for admission by Dr. Olevia Bowens [VK]    Clinical Course User Index [VK] Ottie Glazier, DO                           Medical Decision Making This patient presents to the ED with chief complaint(s) of abdominal pain with pertinent past medical history of diabetes, CKD, recent diagnosis of diverticulitis which further complicates the presenting complaint. The complaint involves an extensive differential diagnosis and also carries with it a high risk of complications and morbidity.    The differential diagnosis includes complications of diverticulitis including intra-abdominal abscess or perforation though less likely as the.  Patient is not peritonitic on exam, concern for sepsis, C. difficile, dehydration, UTI  Additional history obtained: Additional history obtained from family Records reviewed previous admission documents  ED Course and Reassessment: Patient was initially evaluated by provider in triage and had labs and CT scan performed.  Her labs show increasing white  count as well as a increasing creatinine 1.9 from baseline of around 1.6.  Patient CT continues to show diverticulitis without any complications.  Because the patient has failed outpatient treatment, she will require admission for IV antibiotics and pain control.  Patient will additionally be tested for C. difficile in the setting of her new diarrhea with her antibiotic use.  She was given morphine for pain and  Zofran for nausea.  She will be given ceftriaxone and Flagyl for antibiotics.  Independent labs interpretation:  The following labs were independently interpreted: Increasing white count, mild AKI with creatinine of 1.9 from baseline of 1.6  Independent visualization of imaging: - I independently visualized the following imaging with scope of interpretation limited to determining acute life threatening conditions related to emergency care: CT AP, which revealed diverticulitis without complication  Consultation: - Consulted or discussed management/test interpretation w/ external professional: Hospitalist  Consideration for admission or further workup: Patient failed outpatient treatment of diverticulitis and will require admission for further management Social Determinants of health: N/A    Risk Prescription drug management. Decision regarding hospitalization.           Final Clinical Impression(s) / ED Diagnoses Final diagnoses:  None    Rx / DC Orders ED Discharge Orders     None         Ottie Glazier, DO 07/24/22 1449

## 2022-07-24 NOTE — ED Provider Triage Note (Signed)
Emergency Medicine Provider Triage Evaluation Note  Diana Harper , a 86 y.o. female  was evaluated in triage.  Pt complains of diffuse abdominal pain, nausea, and diarrhea for the past two weeks that worsened for few days. She was discharged on 07/17/22 for "mild diverticulitis".  Review of Systems  Positive:  Negative:   Physical Exam  BP (!) 112/53 (BP Location: Right Arm)   Pulse 78   Temp 98.2 F (36.8 C) (Oral)   Resp 16   SpO2 99%  Gen:   Awake, no distress   Resp:  Normal effort  MSK:   Moves extremities without difficulty  Other:  Diffuse abdominal tenderness with voluntary guarding.   Medical Decision Making  Medically screening exam initiated at 12:49 PM.  Appropriate orders placed.  Donae Kueker was informed that the remainder of the evaluation will be completed by another provider, this initial triage assessment does not replace that evaluation, and the importance of remaining in the ED until their evaluation is complete.  Will order labs and CT imaging.    Sherrell Puller, [place] 07/24/22 1251

## 2022-07-24 NOTE — ED Triage Notes (Signed)
Pt arrived via EMS, from home, c/o diffuse abd pain. Pt daughter stated several bowel mvmts today. Currently taking abx (amoxicillin)

## 2022-07-24 NOTE — H&P (Signed)
History and Physical    Patient: Diana Harper SNK:[id number] DOB: [birth year] DOA: 07/24/2022 DOS: the patient was seen and examined on 07/24/2022 PCP: [name], MD  Patient coming from: Home  Chief Complaint:  Chief Complaint  Patient presents with   Abdominal Pain   HPI: Diana Harper is a 86 y.o. female with medical history significant of deficiency anemia, anxiety, osteoarthritis, asthma, cholelithiasis, biliary colic, stage IIIb CKD, colon polyps, gastric ulcer, glaucoma, hyperlipidemia, hypertension, hypothyroidism, type 2 diabetes, history of CVA, vitamin D deficiency peripheral neuropathy who has been recently admitted and discharged for acute diverticulitis who is returning to the emergency department today with abdominal pain, nausea and multiple loose stools episodes associated with decreased appetite, fatigue and malaise.  No melena or hematochezia.  She has had chills, but no fever or night sweats.  No sore throat, rhinorrhea, dyspnea, wheezing or hemoptysis.  No chest pain, palpitations, diaphoresis, PND, orthopnea or pitting edema of the lower extremities.  No flank pain, dysuria, frequency or hematuria.  No polyuria, polydipsia, polyphagia or blurred vision.  ED course: Initial vital signs were temperature 98.2 F, pulse 78, respirations 16, BP 112/53 mmHg O2 sat 99% on room air.  The patient received ondansetron 4 mg, morphine 4 mg, sodium chloride 1000 mL bolus, ceftriaxone and metronidazole IV.  Lab work: CBC 0 white count of 14.0 with 89% neutrophils, hemoglobin 10.7 g/dL platelets 230.  Electrolytes, lipase and lactic acid were normal.  BUN was 32, creatinine 1.90 and glucose 119 mg/dL.  Imaging: CT abdomen/pelvis without contrast showed acute uncomplicated diverticulitis of the sigmoid colon and distal descending colon.  There was aortic atherosclerosis.   Review of Systems: As mentioned in the history of present illness. All other systems reviewed and are  negative. Past Medical History:  Diagnosis Date   Anemia    Anxiety    Arthritis    Asthma    Biliary colic    Cholelithiasis    CKD (chronic kidney disease), stage III [facility])    nephrologist--  dr Meredeth Ide   Colon polyps    Diverticulosis of colon    Gastric ulcer    Glaucoma    History of asthma    1980's  no longer problem since 1980's   History of diverticulitis of colon    recurrent--  2014;  2013;  2012   Hyperlipidemia    Hypertension    Hypothyroidism    Insulin dependent type 2 diabetes mellitus ([place])    followed by dr Jenny Reichmann lambeth ([facility])    Murmur    Peripheral neuropathy    Stroke [facility])    Vitamin D deficiency    Wears hearing aid    bilateral   Wears partial dentures    lower partial and upper full   Past Surgical History:  Procedure Laterality Date   ABDOMINAL HYSTERECTOMY  1970's   CARDIOVASCULAR STRESS TEST  07/30/2009   normal exercise lexiscan nuclear study w/ no ischemia/  normal LV funciton and wall motion, ef 77%   CATARACT EXTRACTION W/ INTRAOCULAR LENS  IMPLANT, BILATERAL  2014   LAPAROSCOPIC CHOLECYSTECTOMY SINGLE SITE WITH INTRAOPERATIVE CHOLANGIOGRAM N/A 08/25/2016   Procedure: LAPAROSCOPIC CHOLECYSTECTOMY WITH INTRAOPERATIVE CHOLANGIOGRAM;  Surgeon: [name], MD;  Location: [facility];  Service: General;  Laterality: N/A;   LUMBAR SPINE SURGERY  2014   REMOVAL AXILLA CYST Right 1990's   Social History:  reports that she has never smoked. She has never used smokeless tobacco. She reports that she  does not drink alcohol and does not use drugs.  Allergies  Allergen Reactions   Latex Anaphylaxis, Shortness Of Breath and Other (See Comments)    Severe respiratory distress   Onion Diarrhea   Other Other (See Comments)    Patient cannot have TREE NUTS and anything with seeds- History of  diverticulitis    Flexeril [Cyclobenzaprine] Other (See Comments)    Caused agitation   Lisinopril Cough   Metformin And Related  Diarrhea   Simvastatin Hives and Rash   Tetracycline Rash   Zithromax [Azithromycin] Rash    Family History  Problem Relation Age of Onset   Stroke Mother    Colon polyps Mother    Diabetes Mother    Diabetes Father    Kidney failure Father    COPD Father    Colon polyps Sister    Diabetes Sister    Stroke Brother    Diabetes Brother    Heart attack Maternal Grandmother    Heart disease Maternal Grandfather    Diabetes Paternal Grandmother    Stroke Paternal Grandfather    Hypertension Paternal Grandfather    Diabetes Sister    Diabetes Sister    Diabetes Son    Diabetes Daughter    Colon cancer Neg Hx    Esophageal cancer Neg Hx    Inflammatory bowel disease Neg Hx    Liver disease Neg Hx    Pancreatic cancer Neg Hx    Rectal cancer Neg Hx    Stomach cancer Neg Hx     Prior to Admission medications   Medication Sig Start Date End Date Taking? Authorizing Provider  acetaminophen (TYLENOL) 500 MG tablet Take 1,000 mg by mouth every 6 (six) hours as needed for mild pain, headache or fever.    [provider]  albuterol (VENTOLIN HFA) 108 (90 Base) MCG/ACT inhaler Inhale 1-2 puffs into the lungs every 6 (six) hours as needed for wheezing or shortness of breath. 02/04/21   [provider]  amLODipine (NORVASC) 10 MG tablet Take 5 mg by mouth daily with breakfast. Patient not taking: Reported on 07/15/2022    [provider]  amoxicillin-clavulanate (AUGMENTIN) 500-125 MG tablet Take 1 tablet (500 mg total) by mouth 2 (two) times daily. 07/17/22   Samuella Cota, MD  BAYER LOW DOSE 81 MG tablet Take 81 mg by mouth daily.    [provider]  buPROPion (WELLBUTRIN XL) 150 MG 24 hr tablet Take 150 mg by mouth daily. 04/05/22   [provider]  Calcium Carbonate (CALTRATE 600 PO) Take 600 mg by mouth daily.    [provider]  Cholecalciferol 1000 units capsule Take 1,000 Units by mouth daily.    [provider]   cloNIDine (CATAPRES - DOSED IN MG/24 HR) 0.2 mg/24hr patch Place 0.2 mg onto the skin once a week.    [provider]  Continuous Blood Gluc Sensor (FREESTYLE LIBRE 14 DAY SENSOR) MISC Inject 1 Device into the skin every 14 (fourteen) days.    [provider]  diclofenac Sodium (VOLTAREN) 1 % GEL Apply 4 g topically 4 (four) times daily as needed (left side pain). 06/10/22   [provider]  Glucagon HCl 1 MG SOLR Inject 1 mg into the skin as needed (for a blood sugar <67).    [provider]  insulin degludec (TRESIBA) 200 UNIT/ML FlexTouch Pen Inject 72 Units into the skin daily.    [provider]  levothyroxine (SYNTHROID) 88 MCG tablet  Take 88 mcg by mouth daily before breakfast.    [provider]  loratadine (CLARITIN) 10 MG tablet Take 10 mg daily as needed by mouth (seasonal allergies).     [provider]  nitroGLYCERIN (NITROSTAT) 0.4 MG SL tablet Place 1 tablet (0.4 mg total) under the tongue every 5 (five) minutes as needed for chest pain. 01/20/21 07/11/22  Cantwell, Celeste C, PA-C  NOVOLOG FLEXPEN 100 UNIT/ML FlexPen Inject 5-10 Units into the skin See admin instructions. Inject 5 units into the skin before meals if BGL is 100-150 and increase to 10 units if BGL is greater than 150 05/21/22   [provider]  omeprazole (PRILOSEC) 40 MG capsule Take 1 capsule (40 mg total) by mouth 2 (two) times daily. Patient taking differently: Take 40 mg by mouth daily as needed (for heartburn). 03/05/21   Mansouraty, Telford Nab., MD  sertraline (ZOLOFT) 50 MG tablet Take 150 mg by mouth at bedtime.    [provider]  [name] 200-62.5-25 MCG/ACT AEPB Inhale 1 puff into the lungs daily as needed (for flares).    [provider]    Physical Exam: Vitals:   07/24/22 1237 07/24/22 1415 07/24/22 1500  BP: (!) 112/53 114/88 (!) 158/71  Pulse: 78 85 94  Resp: '16 17 17  '$ Temp: 98.2 F (36.8 C) 98.3 F (36.8  C)   TempSrc: Oral Oral   SpO2: 99% 98% 97%  Weight:  72.6 kg   Height:  '5\' 6"'$  (1.676 m)    Physical Exam Vitals and nursing note reviewed.  Constitutional:      General: She is not in acute distress.    Appearance: She is well-developed.  HENT:     Head: Normocephalic.     Nose: No rhinorrhea.     Mouth/Throat:     Mouth: Mucous membranes are dry.  Eyes:     General: No scleral icterus.    Pupils: Pupils are equal, round, and reactive to light.  Neck:     Vascular: No JVD.  Cardiovascular:     Rate and Rhythm: Normal rate.     Heart sounds: S1 normal and S2 normal.  Pulmonary:     Effort: Pulmonary effort is normal.     Breath sounds: Normal breath sounds.  Abdominal:     General: Bowel sounds are increased.     Palpations: Abdomen is soft.     Tenderness: There is abdominal tenderness in the left lower quadrant. There is no right CVA tenderness, left CVA tenderness, guarding or rebound.  Musculoskeletal:     Cervical back: Neck supple.     Right lower leg: No edema.     Left lower leg: No edema.  Skin:    General: Skin is warm and dry.  Neurological:     General: No focal deficit present.     Mental Status: She is alert. Mental status is at baseline.  Psychiatric:        Mood and Affect: Mood normal.        Behavior: Behavior normal.   Data Reviewed:  There are no new results to review at this time.  Assessment and Plan: Principal Problem:   Acute diverticulitis Admit to telemetry/inpatient. Continue IV fluids. Continue ceftriaxone 1 g every 24 hours. Continue metronidazole 500 mg IVPB q 12 hr. Analgesics and antiemetics as needed. Follow CBC and CMP in a.m. Consider surgical evaluation if no improvement.  Active Problems:   CKD stage 3 due to type 2  diabetes mellitus (HCC) Monitor hematocrit and hemoglobin.    Essential hypertension Continue Catapres 0.2 mg patch. Monitor BP and heart rate.    Hypothyroidism Continue levothyroxine 88 mcg p.o.  daily.    Depression Continue bupropion 150 mg p.o. every 24 hours. OP follow-up with behavioral health and/or PCP.    COPD (chronic obstructive pulmonary disease) (HCC) Continue Trelegy Ellipta daily. Supplemental oxygen as needed. Short acting bronchodilators as needed.    DM type 2 (diabetes mellitus, type 2) ([place]) Currently on clear liquid diet. Continue Tresiba at a lower dose. CBG monitoring with RI SS.    Normocytic anemia Monitor hematocrit and hemoglobin.     Advance Care Planning:   Code Status: Full Code   Consults:   Family Communication: Her daughter was present in her room and provided help with history.  Severity of Illness: The appropriate patient status for this patient is INPATIENT. Inpatient status is judged to be reasonable and necessary in order to provide the required intensity of service to ensure the patient's safety. The patient's presenting symptoms, physical exam findings, and initial radiographic and laboratory data in the context of their chronic comorbidities is felt to place them at high risk for further clinical deterioration. Furthermore, it is not anticipated that the patient will be medically stable for discharge from the hospital within 2 midnights of admission.   * I certify that at the point of admission it is my clinical judgment that the patient will require inpatient hospital care spanning beyond 2 midnights from the point of admission due to high intensity of service, high risk for further deterioration and high frequency of surveillance required.*  Author: Reubin Milan, MD 07/24/2022 3:22 PM  For on call review [email or web address].   This document was prepared using Dragon voice recognition software and may contain some unintended transcription errors.

## 2022-07-25 ENCOUNTER — Other Ambulatory Visit: Payer: Self-pay

## 2022-07-25 DIAGNOSIS — H9193 Unspecified hearing loss, bilateral: Secondary | ICD-10-CM | POA: Diagnosis not present

## 2022-07-25 DIAGNOSIS — I10 Essential (primary) hypertension: Secondary | ICD-10-CM | POA: Diagnosis not present

## 2022-07-25 DIAGNOSIS — K760 Fatty (change of) liver, not elsewhere classified: Secondary | ICD-10-CM | POA: Diagnosis not present

## 2022-07-25 DIAGNOSIS — A0472 Enterocolitis due to Clostridium difficile, not specified as recurrent: Secondary | ICD-10-CM

## 2022-07-25 DIAGNOSIS — Z9181 History of falling: Secondary | ICD-10-CM | POA: Diagnosis not present

## 2022-07-25 DIAGNOSIS — I69354 Hemiplegia and hemiparesis following cerebral infarction affecting left non-dominant side: Secondary | ICD-10-CM | POA: Diagnosis not present

## 2022-07-25 DIAGNOSIS — E1140 Type 2 diabetes mellitus with diabetic neuropathy, unspecified: Secondary | ICD-10-CM | POA: Diagnosis not present

## 2022-07-25 DIAGNOSIS — R32 Unspecified urinary incontinence: Secondary | ICD-10-CM | POA: Diagnosis not present

## 2022-07-25 DIAGNOSIS — K5792 Diverticulitis of intestine, part unspecified, without perforation or abscess without bleeding: Secondary | ICD-10-CM | POA: Diagnosis not present

## 2022-07-25 DIAGNOSIS — M19012 Primary osteoarthritis, left shoulder: Secondary | ICD-10-CM | POA: Diagnosis not present

## 2022-07-25 LAB — COMPREHENSIVE METABOLIC PANEL
ALT: 28 U/L (ref 0–44)
AST: 23 U/L (ref 15–41)
Albumin: 2.8 g/dL — ABNORMAL LOW (ref 3.5–5.0)
Alkaline Phosphatase: 96 U/L (ref 38–126)
Anion gap: 8 (ref 5–15)
BUN: 27 mg/dL — ABNORMAL HIGH (ref 8–23)
CO2: 21 mmol/L — ABNORMAL LOW (ref 22–32)
Calcium: 8.4 mg/dL — ABNORMAL LOW (ref 8.9–10.3)
Chloride: 110 mmol/L (ref 98–111)
Creatinine, Ser: 1.59 mg/dL — ABNORMAL HIGH (ref 0.44–1.00)
GFR, Estimated: 32 mL/min — ABNORMAL LOW (ref >60)
Glucose, Bld: 138 mg/dL — ABNORMAL HIGH (ref 70–99)
Potassium: 3.3 mmol/L — ABNORMAL LOW (ref 3.5–5.1)
Sodium: 139 mmol/L (ref 135–145)
Total Bilirubin: 0.6 mg/dL (ref 0.3–1.2)
Total Protein: 5.9 g/dL — ABNORMAL LOW (ref 6.5–8.1)

## 2022-07-25 LAB — C DIFFICILE QUICK SCREEN W PCR REFLEX
C Diff antigen: POSITIVE — AB
C Diff interpretation: DETECTED
C Diff toxin: POSITIVE — AB

## 2022-07-25 LAB — CBC
HCT: 28.6 % — ABNORMAL LOW (ref 36.0–46.0)
Hemoglobin: 9.5 g/dL — ABNORMAL LOW (ref 12.0–15.0)
MCH: 30.4 pg (ref 26.0–34.0)
MCHC: 33.2 g/dL (ref 30.0–36.0)
MCV: 91.4 fL (ref 80.0–100.0)
Platelets: 193 10*3/uL (ref 150–400)
RBC: 3.13 MIL/uL — ABNORMAL LOW (ref 3.87–5.11)
RDW: 14.6 % (ref 11.5–15.5)
WBC: 12.7 10*3/uL — ABNORMAL HIGH (ref 4.0–10.5)
nRBC: 0 % (ref 0.0–0.2)

## 2022-07-25 LAB — GLUCOSE, CAPILLARY
Glucose-Capillary: 129 mg/dL — ABNORMAL HIGH (ref 70–99)
Glucose-Capillary: 196 mg/dL — ABNORMAL HIGH (ref 70–99)
Glucose-Capillary: 176 mg/dL — ABNORMAL HIGH (ref 70–99)
Glucose-Capillary: 181 mg/dL — ABNORMAL HIGH (ref 70–99)

## 2022-07-25 MED ORDER — ORAL CARE MOUTH RINSE: 15.0000 mL | OROMUCOSAL | Status: DC | PRN

## 2022-07-25 MED ORDER — POTASSIUM CHLORIDE CRYS ER 20 MEQ PO TBCR
40.0000 meq | EXTENDED_RELEASE_TABLET | Freq: Once | ORAL | Status: AC
Start: 2022-07-25 — End: 2022-07-25
  Administered 2022-07-25: 40 meq via ORAL
  Filled 2022-07-25: qty 2

## 2022-07-25 MED ORDER — INSULIN ASPART 100 UNIT/ML IJ SOLN: 0.0000 [IU] | Freq: Every day | INTRAMUSCULAR | Status: DC

## 2022-07-25 MED ORDER — FLUTICASONE FUROATE-VILANTEROL 200-25 MCG/ACT IN AEPB
1.0000 | INHALATION_SPRAY | Freq: Every day | RESPIRATORY_TRACT | Status: DC
  Administered 2022-07-27 – 2022-07-28 (×2): 1 via RESPIRATORY_TRACT
  Filled 2022-07-25: qty 28

## 2022-07-25 MED ORDER — INSULIN ASPART 100 UNIT/ML IJ SOLN
0.0000 [IU] | Freq: Three times a day (TID) | INTRAMUSCULAR | Status: DC
  Administered 2022-07-25: 3 [IU] via SUBCUTANEOUS
  Administered 2022-07-26: 2 [IU] via SUBCUTANEOUS
  Administered 2022-07-26 (×2): 3 [IU] via SUBCUTANEOUS
  Administered 2022-07-27: 2 [IU] via SUBCUTANEOUS
  Administered 2022-07-28: 3 [IU] via SUBCUTANEOUS

## 2022-07-25 MED ORDER — LEVOTHYROXINE SODIUM 88 MCG PO TABS
88.0000 ug | ORAL_TABLET | Freq: Every day | ORAL | Status: DC
  Administered 2022-07-26 – 2022-07-28 (×3): 88 ug via ORAL
  Filled 2022-07-25 (×3): qty 1

## 2022-07-25 MED ORDER — SERTRALINE HCL 50 MG PO TABS
150.0000 mg | ORAL_TABLET | Freq: Every day | ORAL | Status: DC
  Administered 2022-07-25 – 2022-07-27 (×3): 150 mg via ORAL
  Filled 2022-07-25 (×3): qty 1

## 2022-07-25 MED ORDER — BUPROPION HCL ER (XL) 150 MG PO TB24
150.0000 mg | ORAL_TABLET | Freq: Every day | ORAL | Status: DC
  Administered 2022-07-25 – 2022-07-28 (×4): 150 mg via ORAL
  Filled 2022-07-25 (×4): qty 1

## 2022-07-25 MED ORDER — VANCOMYCIN HCL 125 MG PO CAPS
125.0000 mg | ORAL_CAPSULE | Freq: Four times a day (QID) | ORAL | Status: DC
  Administered 2022-07-25 – 2022-07-28 (×11): 125 mg via ORAL
  Filled 2022-07-25 (×16): qty 1

## 2022-07-25 MED ORDER — UMECLIDINIUM BROMIDE 62.5 MCG/ACT IN AEPB
1.0000 | INHALATION_SPRAY | Freq: Every day | RESPIRATORY_TRACT | Status: DC
  Administered 2022-07-27 – 2022-07-28 (×2): 1 via RESPIRATORY_TRACT
  Filled 2022-07-25: qty 7

## 2022-07-25 NOTE — Progress Notes (Signed)
   07/25/22 0512  Assess: MEWS Score  Temp (!) 102.3 F (39.1 C) (RN notified)  BP 137/69  MAP (mmHg) 89  Pulse Rate 97  Resp 18  SpO2 95 %  O2 Device Room Air  Assess: MEWS Score  MEWS Temp 2  MEWS Systolic 0  MEWS Pulse 0  MEWS RR 0  MEWS LOC 0  MEWS Score 2  MEWS Score Color Yellow  Assess: if the MEWS score is Yellow or Red  Were vital signs taken at a resting state? Yes  Focused Assessment No change from prior assessment  Does the patient meet 2 or more of the SIRS criteria? No  MEWS guidelines implemented *See Row Information* Yes  Treat  MEWS Interventions Administered prn meds/treatments  Pain Scale 0-10  Pain Score 0  Escalate  MEWS: Escalate Yellow: discuss with charge nurse/RN and consider discussing with provider and RRT  Notify: Charge Nurse/RN  Name of Charge Nurse/RN Notified HUI, RN  Date Charge Nurse/RN Notified 07/25/22  Time Charge Nurse/RN Notified [id number]  Notify: Provider  Provider Name/Title Gean Birchwood MD  Date Provider Notified 07/25/22  Time Provider Notified [phone]  Method of Notification Page  Notification Reason Change in status  Provider response No new orders  Date of Provider Response 07/25/22  Time of Provider Response 0532  Assess: SIRS CRITERIA  SIRS Temperature  1  SIRS Pulse 1  SIRS Respirations  0  SIRS WBC 1  SIRS Score Sum  3

## 2022-07-25 NOTE — Progress Notes (Addendum)
PROGRESS NOTE    Diana Harper  PZW:[id number] DOB: 09/12/1936 DOA: 07/24/2022 PCP: [name], MD    Brief Narrative:  Diana Harper is a 86 y.o. female with medical history significant of deficiency anemia, anxiety, osteoarthritis, asthma, cholelithiasis, biliary colic, stage IIIb CKD, colon polyps, gastric ulcer, glaucoma, hyperlipidemia, hypertension, hypothyroidism, type 2 diabetes, history of CVA, vitamin D deficiency peripheral neuropathy who has been recently admitted and discharged for acute diverticulitis who is returning to the emergency department today with abdominal pain, nausea and multiple loose stools episodes.  Found to have c diff.   Assessment and Plan: C diff colitis -tx for diverticulitis already Continue IV fluids. -PO vanc Analgesics and antiemetics as needed. -advance diet     CKD stage 3 due to type 2 diabetes mellitus (HCC) -Cr improved with IVF     Essential hypertension Continue Catapres 0.2 mg patch. When BP supports Monitor BP and heart rate.     Hypothyroidism Continue levothyroxine 88 mcg p.o. daily.   Hypokalemia -replete    Depression Continue bupropion 150 mg p.o. every 24 hours. OP follow-up with behavioral health and/or PCP.     COPD (chronic obstructive pulmonary disease) (HCC) Continue Trelegy Ellipta daily.     DM type 2 (diabetes mellitus, type 2) ([place]) Currently on clear liquid diet. Continue Tresiba at a lower dose. CBG monitoring with RI SS.     Normocytic anemia Monitor hematocrit and hemoglobin.   DVT prophylaxis: SCDs Start: 07/24/22 1516    Code Status: Full Code Family Communication:   Disposition Plan:  Level of care: Telemetry Status is: Inpatient Remains inpatient appropriate because: treatment of c iff    Consultants:  none   Subjective: Had a fever this AM  Objective: Vitals:   07/25/22 0004 07/25/22 0512 07/25/22 0745 07/25/22 1008  BP: (!) 121/55 137/69 (!) 144/61 130/64  Pulse:  99 97 91 94  Resp: '18 18 18 20  '$ Temp: (!) 100.6 F (38.1 C) (!) 102.3 F (39.1 C) 99.8 F (37.7 C) (!) 101.5 F (38.6 C)  TempSrc: Oral Oral Oral Oral  SpO2: 91% 95% 95% 96%  Weight:      Height:        Intake/Output Summary (Last 24 hours) at 07/25/2022 1213 Last data filed at 07/25/2022 0900 Gross per 24 hour  Intake 1523.16 ml  Output --  Net 1523.16 ml   Filed Weights   07/24/22 1415  Weight: 72.6 kg    Examination:   General: Appearance:     Overweight female in no acute distress        Heart:    Normal heart rate.    MS:   All extremities are intact.    Neurologic:   Awake, alert       Data Reviewed: I have personally reviewed following labs and imaging studies  CBC: Recent Labs  Lab 07/24/22 1205 07/24/22 1313 07/25/22 0557  WBC 14.0*  --  12.7*  NEUTROABS 12.3*  --   --   HGB 10.7* 11.2* 9.5*  HCT 32.8* 33.0* 28.6*  MCV 92.1  --  91.4  PLT 230  --  423   Basic Metabolic Panel: Recent Labs  Lab 07/24/22 1313 07/25/22 0557  NA 139 139  K 3.5 3.3*  CL 102 110  CO2  --  21*  GLUCOSE 119* 138*  BUN 32* 27*  CREATININE 1.90* 1.59*  CALCIUM  --  8.4*   GFR: Estimated Creatinine Clearance: 27.3 mL/min (A) (by C-G formula  based on SCr of 1.59 mg/dL (H)). Liver Function Tests: Recent Labs  Lab 07/25/22 0557  AST 23  ALT 28  ALKPHOS 96  BILITOT 0.6  PROT 5.9*  ALBUMIN 2.8*   Recent Labs  Lab 07/24/22 1205  LIPASE 26   No results for input(s): "AMMONIA" in the last 168 hours. Coagulation Profile: No results for input(s): "INR", "PROTIME" in the last 168 hours. Cardiac Enzymes: No results for input(s): "CKTOTAL", "CKMB", "CKMBINDEX", "TROPONINI" in the last 168 hours. BNP (last 3 results) No results for input(s): "PROBNP" in the last 8760 hours. HbA1C: No results for input(s): "HGBA1C" in the last 72 hours. CBG: Recent Labs  Lab 07/24/22 1722 07/24/22 2218 07/25/22 0800  GLUCAP 106* 124* 129*   Lipid Profile: No  results for input(s): "CHOL", "HDL", "LDLCALC", "TRIG", "CHOLHDL", "LDLDIRECT" in the last 72 hours. Thyroid Function Tests: No results for input(s): "TSH", "T4TOTAL", "FREET4", "T3FREE", "THYROIDAB" in the last 72 hours. Anemia Panel: No results for input(s): "VITAMINB12", "FOLATE", "FERRITIN", "TIBC", "IRON", "RETICCTPCT" in the last 72 hours. Sepsis Labs: Recent Labs  Lab 07/24/22 1205  LATICACIDVEN 1.2    Recent Results (from the past 240 hour(s))  C Difficile Quick Screen w PCR reflex     Status: Abnormal   Collection Time: 07/24/22  7:54 AM   Specimen: STOOL  Result Value Ref Range Status   C Diff antigen POSITIVE (A) NEGATIVE Final   C Diff toxin POSITIVE (A) NEGATIVE Final   C Diff interpretation Toxin producing C. difficile detected.  Final    Comment: CRITICAL RESULT CALLED TO, READ BACK BY AND VERIFIED WITH: LOGAN RN AT 0909 ON 8.27.23 BY S.VANHOORNE Performed at [facility] [address] [place], [place] [address]          Radiology Studies: CT ABDOMEN PELVIS WO CONTRAST  Result Date: 07/24/2022 CLINICAL DATA:  Abdominal pain, acute, nonlocalized h/o diverticulitis EXAM: CT ABDOMEN AND PELVIS WITHOUT CONTRAST TECHNIQUE: Multidetector CT imaging of the abdomen and pelvis was performed following the standard protocol without IV contrast. RADIATION DOSE REDUCTION: This exam was performed according to the departmental dose-optimization program which includes automated exposure control, adjustment of the mA and/or kV according to patient size and/or use of iterative reconstruction technique. COMPARISON:  07/15/2022 FINDINGS: Lower chest: No acute abnormality. Hepatobiliary: No focal liver abnormality is seen. Status post cholecystectomy. No biliary dilatation. Pancreas: Unremarkable. No pancreatic ductal dilatation or surrounding inflammatory changes. Spleen: Normal in size without focal abnormality. Adrenals/Urinary Tract: Adrenal glands are  unremarkable. Kidneys are normal, without renal calculi, focal lesion, or hydronephrosis. Bladder is unremarkable. Stomach/Bowel: Findings of acute diverticulitis involving the sigmoid colon and distal descending colon with wall thickening, pericolonic fat stranding and trace fluid. No dilated loops of bowel. Stomach within normal limits. Vascular/Lymphatic: Aortic atherosclerosis. No enlarged abdominal or pelvic lymph nodes. Reproductive: Status post hysterectomy. No adnexal masses. Other: No ascites. No organized abdominopelvic fluid collection. No pneumoperitoneum. Musculoskeletal: No new or acute bony findings. Scoliotic thoracolumbar curvature with multilevel spondylosis. IMPRESSION: 1. Acute uncomplicated diverticulitis of the sigmoid colon and distal descending colon. 2. Aortic atherosclerosis (ICD10-I70.0). Electronically Signed   By: Davina Poke D.O.   On: 07/24/2022 13:30        Scheduled Meds:  pantoprazole  40 mg Oral Daily   prochlorperazine  5 mg Intravenous Once   vancomycin  125 mg Oral QID   Continuous Infusions:  lactated ringers Stopped (07/24/22 2139)     LOS: 1 day    Time  spent: 45 minutes spent on chart review, discussion with nursing staff, consultants, updating family and interview/physical exam; more than 50% of that time was spent in counseling and/or coordination of care.    Geradine Girt, DO [facility] Available via Epic secure chat 7am-7pm After these hours, please refer to coverage provider listed on [email or web address] 07/25/2022, 12:13 PM

## 2022-07-26 ENCOUNTER — Other Ambulatory Visit (HOSPITAL_COMMUNITY): Payer: Self-pay

## 2022-07-26 ENCOUNTER — Telehealth (HOSPITAL_COMMUNITY): Payer: Self-pay | Admitting: Pharmacy Technician

## 2022-07-26 DIAGNOSIS — K5792 Diverticulitis of intestine, part unspecified, without perforation or abscess without bleeding: Secondary | ICD-10-CM | POA: Diagnosis not present

## 2022-07-26 LAB — BASIC METABOLIC PANEL
Anion gap: 6 (ref 5–15)
BUN: 28 mg/dL — ABNORMAL HIGH (ref 8–23)
CO2: 23 mmol/L (ref 22–32)
Calcium: 8.4 mg/dL — ABNORMAL LOW (ref 8.9–10.3)
Chloride: 109 mmol/L (ref 98–111)
Creatinine, Ser: 1.64 mg/dL — ABNORMAL HIGH (ref 0.44–1.00)
GFR, Estimated: 31 mL/min — ABNORMAL LOW (ref >60)
Glucose, Bld: 167 mg/dL — ABNORMAL HIGH (ref 70–99)
Potassium: 3.5 mmol/L (ref 3.5–5.1)
Sodium: 138 mmol/L (ref 135–145)

## 2022-07-26 LAB — CBC
HCT: 31.9 % — ABNORMAL LOW (ref 36.0–46.0)
Hemoglobin: 10.2 g/dL — ABNORMAL LOW (ref 12.0–15.0)
MCH: 29.9 pg (ref 26.0–34.0)
MCHC: 32 g/dL (ref 30.0–36.0)
MCV: 93.5 fL (ref 80.0–100.0)
Platelets: 238 10*3/uL (ref 150–400)
RBC: 3.41 MIL/uL — ABNORMAL LOW (ref 3.87–5.11)
RDW: 14.6 % (ref 11.5–15.5)
WBC: 15.1 10*3/uL — ABNORMAL HIGH (ref 4.0–10.5)
nRBC: 0 % (ref 0.0–0.2)

## 2022-07-26 LAB — GLUCOSE, CAPILLARY
Glucose-Capillary: 167 mg/dL — ABNORMAL HIGH (ref 70–99)
Glucose-Capillary: 156 mg/dL — ABNORMAL HIGH (ref 70–99)
Glucose-Capillary: 162 mg/dL — ABNORMAL HIGH (ref 70–99)
Glucose-Capillary: 119 mg/dL — ABNORMAL HIGH (ref 70–99)

## 2022-07-26 MED ORDER — BOOST / RESOURCE BREEZE PO LIQD CUSTOM
1.0000 | Freq: Three times a day (TID) | ORAL | Status: DC
  Administered 2022-07-26 – 2022-07-28 (×6): 1 via ORAL

## 2022-07-26 NOTE — TOC Benefit Eligibility Note (Signed)
Patient Teacher, English as a foreign language completed.    The patient is currently admitted and upon discharge could be taking vancomycin 125 mg capsules.  The current 10 day co-pay is $120.77 due to being in Coverage Gap (donut hole).   The patient is insured through [place] Patient Advocate Specialist [place] Patient Advocate Team Direct Number: [phone]  Fax: [phone]

## 2022-07-26 NOTE — Progress Notes (Signed)
PROGRESS NOTE    Diana Harper  GXQ:[id number] DOB: 03-28-1936 DOA: 07/24/2022 PCP: [name], MD    Brief Narrative:  Diana Harper is a 86 y.o. female with medical history significant of deficiency anemia, anxiety, osteoarthritis, asthma, cholelithiasis, biliary colic, stage IIIb CKD, colon polyps, gastric ulcer, glaucoma, hyperlipidemia, hypertension, hypothyroidism, type 2 diabetes, history of CVA, vitamin D deficiency peripheral neuropathy who has been recently admitted and discharged for acute diverticulitis who is returning to the emergency department today with abdominal pain, nausea and multiple loose stools episodes.  Found to have c diff.   Assessment and Plan: C diff colitis -tx for diverticulitis already Continue IV fluids. -PO vanc Analgesics and antiemetics as needed. -advance diet to soft  -to be d/c'd home need to be a febrile, eating and low numbers of BMs     CKD stage 3 due to type 2 diabetes mellitus (HCC) -Cr improved with IVF     Essential hypertension Continue Catapres 0.2 mg patch. When BP supports Monitor BP and heart rate.     Hypothyroidism Continue levothyroxine 88 mcg p.o. daily.   Hypokalemia -replete    Depression Continue bupropion 150 mg p.o. every 24 hours. OP follow-up with behavioral health and/or PCP.     COPD (chronic obstructive pulmonary disease) (HCC) Continue Trelegy Ellipta daily.     DM type 2 (diabetes mellitus, type 2) (HCC) CBG monitoring with RI SS.     Normocytic anemia Monitor hematocrit and hemoglobin.   DVT prophylaxis: SCDs Start: 07/24/22 1516    Code Status: Full Code Family Communication: daughter at bedside 8/27  Disposition Plan:  Level of care: Telemetry Status is: Inpatient Remains inpatient appropriate because: treatment of c diff    Consultants:  none   Subjective: Tolerating diet fine  Objective: Vitals:   07/26/22 0010 07/26/22 0211 07/26/22 0627 07/26/22 0959  BP: 135/70  138/67 (!) 148/65 128/60  Pulse: 92 91 100 90  Resp: 18   17  Temp: 100.2 F (37.9 C) 100.2 F (37.9 C) (!) 100.4 F (38 C) 99.5 F (37.5 C)  TempSrc: Oral Oral Oral Oral  SpO2: 100% 97% 96% 96%  Weight:      Height:       No intake or output data in the 24 hours ending 07/26/22 1100  Filed Weights   07/24/22 1415  Weight: 72.6 kg    Examination:    General: Appearance:     Overweight female in no acute distress     Lungs:     respirations unlabored  Heart:    Normal heart rate.   MS:   All extremities are intact.   Neurologic:   Awake, alert         Data Reviewed: I have personally reviewed following labs and imaging studies  CBC: Recent Labs  Lab 07/24/22 1205 07/24/22 1313 07/25/22 0557 07/26/22 0449  WBC 14.0*  --  12.7* 15.1*  NEUTROABS 12.3*  --   --   --   HGB 10.7* 11.2* 9.5* 10.2*  HCT 32.8* 33.0* 28.6* 31.9*  MCV 92.1  --  91.4 93.5  PLT 230  --  193 144   Basic Metabolic Panel: Recent Labs  Lab 07/24/22 1313 07/25/22 0557 07/26/22 0449  NA 139 139 138  K 3.5 3.3* 3.5  CL 102 110 109  CO2  --  21* 23  GLUCOSE 119* 138* 167*  BUN 32* 27* 28*  CREATININE 1.90* 1.59* 1.64*  CALCIUM  --  8.4* 8.4*  GFR: Estimated Creatinine Clearance: 26.5 mL/min (A) (by C-G formula based on SCr of 1.64 mg/dL (H)). Liver Function Tests: Recent Labs  Lab 07/25/22 0557  AST 23  ALT 28  ALKPHOS 96  BILITOT 0.6  PROT 5.9*  ALBUMIN 2.8*   Recent Labs  Lab 07/24/22 1205  LIPASE 26   No results for input(s): "AMMONIA" in the last 168 hours. Coagulation Profile: No results for input(s): "INR", "PROTIME" in the last 168 hours. Cardiac Enzymes: No results for input(s): "CKTOTAL", "CKMB", "CKMBINDEX", "TROPONINI" in the last 168 hours. BNP (last 3 results) No results for input(s): "PROBNP" in the last 8760 hours. HbA1C: No results for input(s): "HGBA1C" in the last 72 hours. CBG: Recent Labs  Lab 07/25/22 0800 07/25/22 1232 07/25/22 1747  07/25/22 2212 07/26/22 0956  GLUCAP 129* 196* 176* 181* 167*   Lipid Profile: No results for input(s): "CHOL", "HDL", "LDLCALC", "TRIG", "CHOLHDL", "LDLDIRECT" in the last 72 hours. Thyroid Function Tests: No results for input(s): "TSH", "T4TOTAL", "FREET4", "T3FREE", "THYROIDAB" in the last 72 hours. Anemia Panel: No results for input(s): "VITAMINB12", "FOLATE", "FERRITIN", "TIBC", "IRON", "RETICCTPCT" in the last 72 hours. Sepsis Labs: Recent Labs  Lab 07/24/22 1205  LATICACIDVEN 1.2    Recent Results (from the past 240 hour(s))  C Difficile Quick Screen w PCR reflex     Status: Abnormal   Collection Time: 07/24/22  7:54 AM   Specimen: STOOL  Result Value Ref Range Status   C Diff antigen POSITIVE (A) NEGATIVE Final   C Diff toxin POSITIVE (A) NEGATIVE Final   C Diff interpretation Toxin producing C. difficile detected.  Final    Comment: CRITICAL RESULT CALLED TO, READ BACK BY AND VERIFIED WITH: LOGAN RN AT 0909 ON 8.27.23 BY S.VANHOORNE Performed at [facility] [address], [place], [place] [address]          Radiology Studies: CT ABDOMEN PELVIS WO CONTRAST  Result Date: 07/24/2022 CLINICAL DATA:  Abdominal pain, acute, nonlocalized h/o diverticulitis EXAM: CT ABDOMEN AND PELVIS WITHOUT CONTRAST TECHNIQUE: Multidetector CT imaging of the abdomen and pelvis was performed following the standard protocol without IV contrast. RADIATION DOSE REDUCTION: This exam was performed according to the departmental dose-optimization program which includes automated exposure control, adjustment of the mA and/or kV according to patient size and/or use of iterative reconstruction technique. COMPARISON:  07/15/2022 FINDINGS: Lower chest: No acute abnormality. Hepatobiliary: No focal liver abnormality is seen. Status post cholecystectomy. No biliary dilatation. Pancreas: Unremarkable. No pancreatic ductal dilatation or surrounding inflammatory changes. Spleen: Normal in  size without focal abnormality. Adrenals/Urinary Tract: Adrenal glands are unremarkable. Kidneys are normal, without renal calculi, focal lesion, or hydronephrosis. Bladder is unremarkable. Stomach/Bowel: Findings of acute diverticulitis involving the sigmoid colon and distal descending colon with wall thickening, pericolonic fat stranding and trace fluid. No dilated loops of bowel. Stomach within normal limits. Vascular/Lymphatic: Aortic atherosclerosis. No enlarged abdominal or pelvic lymph nodes. Reproductive: Status post hysterectomy. No adnexal masses. Other: No ascites. No organized abdominopelvic fluid collection. No pneumoperitoneum. Musculoskeletal: No new or acute bony findings. Scoliotic thoracolumbar curvature with multilevel spondylosis. IMPRESSION: 1. Acute uncomplicated diverticulitis of the sigmoid colon and distal descending colon. 2. Aortic atherosclerosis (ICD10-I70.0). Electronically Signed   By: Davina Poke D.O.   On: 07/24/2022 13:30        Scheduled Meds:  buPROPion  150 mg Oral Daily   feeding supplement  1 Container Oral TID BM   fluticasone furoate-vilanterol  1 puff Inhalation Daily  And   umeclidinium bromide  1 puff Inhalation Daily   insulin aspart  0-15 Units Subcutaneous TID WC   insulin aspart  0-5 Units Subcutaneous QHS   levothyroxine  88 mcg Oral Q0600   pantoprazole  40 mg Oral Daily   prochlorperazine  5 mg Intravenous Once   sertraline  150 mg Oral QHS   vancomycin  125 mg Oral QID   Continuous Infusions:  lactated ringers 75 mL/hr at 07/26/22 0254     LOS: 2 days    Time spent: 45 minutes spent on chart review, discussion with nursing staff, consultants, updating family and interview/physical exam; more than 50% of that time was spent in counseling and/or coordination of care.    Geradine Girt, DO [facility] Available via Epic secure chat 7am-7pm After these hours, please refer to coverage provider listed on  [email or web address] 07/26/2022, 11:00 AM

## 2022-07-26 NOTE — Progress Notes (Signed)
   07/25/22 2210  Assess: MEWS Score  Temp (!) 102.9 F (39.4 C)  BP (!) 142/64  MAP (mmHg) 88  Pulse Rate 98  Resp 19  SpO2 93 %  O2 Device Room Air  Assess: MEWS Score  MEWS Temp 2  MEWS Systolic 0  MEWS Pulse 0  MEWS RR 0  MEWS LOC 0  MEWS Score 2  MEWS Score Color Yellow  Assess: if the MEWS score is Yellow or Red  Were vital signs taken at a resting state? Yes  Focused Assessment Change from prior assessment (see assessment flowsheet)  Does the patient meet 2 or more of the SIRS criteria? Yes  Does the patient have a confirmed or suspected source of infection? Yes  Provider and Rapid Response Notified? Yes  MEWS guidelines implemented *See Row Information* Yes  Treat  MEWS Interventions Administered prn meds/treatments;Administered scheduled meds/treatments;Escalated (See documentation below)  Take Vital Signs  Increase Vital Sign Frequency  Yellow: Q 2hr X 2 then Q 4hr X 2, if remains yellow, continue Q 4hrs  Escalate  MEWS: Escalate Yellow: discuss with charge nurse/RN and consider discussing with provider and RRT  Notify: Charge Nurse/RN  Name of Charge Nurse/RN Notified Hui, RN  Date Charge Nurse/RN Notified 07/25/22  Time Charge Nurse/RN Notified 2228  Notify: Provider  Provider Name/Title Renetta Chalk, MD  Date Provider Notified 07/25/22  Time Provider Notified 2229  Method of Notification Page  Notification Reason Change in status  Provider response No new orders  Date of Provider Response 07/25/22  Time of Provider Response 2230  Notify: Rapid Response  Name of Rapid Response RN Notified Erin, RN  Date Rapid Response Notified 07/25/22  Time Rapid Response Notified 2226  Document  Patient Outcome Stabilized after interventions  Progress note created (see row info) Yes  Assess: SIRS CRITERIA  SIRS Temperature  1  SIRS Pulse 1  SIRS Respirations  0  SIRS WBC 1  SIRS Score Sum  3

## 2022-07-26 NOTE — Progress Notes (Signed)
PT Cancellation Note  Patient Details Name: Malena Timpone MRN: [id number] DOB: 06-12-1936   Cancelled Treatment:    Reason Eval/Treat Not Completed: Other (comment), Patient recliner and prefers to stay, does not feel like ambulating. [place] Office [phone] Weekend [phone]    Claretha Cooper 07/26/2022, 1:03 PM

## 2022-07-26 NOTE — Plan of Care (Signed)
  Problem: Activity: Goal: Risk for activity intolerance will decrease Outcome: Progressing   Problem: Nutrition: Goal: Adequate nutrition will be maintained Outcome: Progressing   Problem: Coping: Goal: Level of anxiety will decrease Outcome: Progressing   Problem: Pain Managment: Goal: General experience of comfort will improve Outcome: Progressing   

## 2022-07-26 NOTE — Evaluation (Signed)
Occupational Therapy Evaluation Patient Details Name: Diana Harper MRN: [id number] DOB: December 19, 1935 Today's Date: 07/26/2022   History of Present Illness Patient presented to the hospital on 8/26 with nausea, increased loose stols and abdominal pain. patient was found to have C diff. of note paient recently discharged with diverticulitis.PMH: COPD, anxiety, osteoarthritis, asthma, cholelithiasis, biliary colic, stage IIIb CKD, colon polyps, gastric ulcer, glaucoma, hyperlipidemia, hypertension, hypothyroidism, type 2 diabetes, history of CVA, vitamin D deficiency peripheral neuropathy   Clinical Impression   Patient is a 86 year old female who was admitted for above. Patient was noted to have increased loose stool with movement impacting session. Patient was educated on importance of getting clean each time to prevent skin breakdown and UTI. Patient verbalized understanding with max A for hygiene in standing. Patient reported living at home with daughter support 24/7 with assist for bathing and dressing LB but S for getting to bathroom with RW. Patient was noted to have decreased functional activity tolerance, decreased endurance, decreased standing balance, decreased safety awareness, and decreased knowledge of AD/AE impacting participation in ADLs.  Patient would continue to benefit from skilled OT services at this time while admitted and after d/c to address noted deficits in order to improve overall safety and independence in ADLs.        Recommendations for follow up therapy are one component of a multi-disciplinary discharge planning process, led by the attending physician.  Recommendations may be updated based on patient status, additional functional criteria and insurance authorization.   Follow Up Recommendations  Home health OT    Assistance Recommended at Discharge Frequent or constant Supervision/Assistance  Patient can return home with the following A little help with walking  and/or transfers;A little help with bathing/dressing/bathroom;Assistance with cooking/housework;Direct supervision/assist for financial management;Assist for transportation;Help with stairs or ramp for entrance;Direct supervision/assist for medications management    Functional Status Assessment  Patient has had a recent decline in their functional status and demonstrates the ability to make significant improvements in function in a reasonable and predictable amount of time.  Equipment Recommendations  None recommended by OT    Recommendations for Other Services       Precautions / Restrictions Restrictions Weight Bearing Restrictions: No      Mobility Bed Mobility Overal bed mobility: Needs Assistance Bed Mobility: Supine to Sit     Supine to sit: Mod assist     General bed mobility comments: with physical assist to scoot fowards to edge of bed with education on weight shifting.    Transfers                          Balance Overall balance assessment: Mild deficits observed, not formally tested                                         ADL either performed or assessed with clinical judgement   ADL Overall ADL's : Needs assistance/impaired Eating/Feeding: Supervision/ safety;Sitting   Grooming: Wash/dry face;Wash/dry hands;Sitting;Set up   Upper Body Bathing: Set up;Sitting   Lower Body Bathing: Maximal assistance;Sitting/lateral leans   Upper Body Dressing : Set up;Sitting   Lower Body Dressing: Maximal assistance;Sitting/lateral leans Lower Body Dressing Details (indicate cue type and reason): patient reported daughter helps with this at home. Toilet Transfer: Minimal assistance;Stand-pivot;Rolling walker (2 wheels) Toilet Transfer Details (indicate cue type and reason): patient was  able to stand pivot to recliner with reported loose stool. patient was noted to have minimal stool after extensive education on needing to get clean after all  voiding to avoid skin breakdown and UTI. patient verbalized understanding. Toileting- Clothing Manipulation and Hygiene: Moderate assistance;Sit to/from stand Toileting - Clothing Manipulation Details (indicate cue type and reason): with increased loose stools on this date.             Vision Patient Visual Report: No change from baseline       Perception     Praxis      Pertinent Vitals/Pain       Hand Dominance Right   Extremity/Trunk Assessment Upper Extremity Assessment Upper Extremity Assessment: LUE deficits/detail LUE Deficits / Details: patient reported pain with movement and reported UE does not move on its own. was able to place on RW while seated. declined formal testing.   Lower Extremity Assessment Lower Extremity Assessment: Defer to PT evaluation   Cervical / Trunk Assessment Cervical / Trunk Assessment: Normal   Communication     Cognition Arousal/Alertness: Awake/alert Behavior During Therapy: WFL for tasks assessed/performed Overall Cognitive Status: Within Functional Limits for tasks assessed                                 General Comments: noted to have some confusion during session but plesant and able to follow cues     General Comments       Exercises     Shoulder Instructions      Home Living Family/patient expects to be discharged to:: Private residence Living Arrangements: Children Available Help at Discharge: Family;Available 24 hours/day Type of Home: House Home Access: Stairs to enter [employer] of Steps: 2-3   Home Layout: One level     Bathroom Shower/Tub: Tub/shower unit                    Prior Functioning/Environment                          OT Problem List: Decreased activity tolerance;Impaired balance (sitting and/or standing);Decreased safety awareness;Cardiopulmonary status limiting activity;Decreased knowledge of precautions;Decreased knowledge of use of DME or AE       OT Treatment/Interventions: Self-care/ADL training;Therapeutic exercise;Neuromuscular education;Energy conservation;DME and/or AE instruction;Therapeutic activities;Balance training;Patient/family education    OT Goals(Current goals can be found in the care plan section) Acute Rehab OT Goals Patient Stated Goal: to get back home OT Goal Formulation: With patient Time For Goal Achievement: 08/09/22 Potential to Achieve Goals: Fair  OT Frequency: Min 2X/week    Co-evaluation              AM-PAC OT "6 Clicks" Daily Activity     Outcome Measure Help from another person eating meals?: None Help from another person taking care of personal grooming?: A Little Help from another person toileting, which includes using toliet, bedpan, or urinal?: A Lot Help from another person bathing (including washing, rinsing, drying)?: A Lot Help from another person to put on and taking off regular upper body clothing?: A Little Help from another person to put on and taking off regular lower body clothing?: A Lot 6 Click Score: 16   End of Session Equipment Utilized During Treatment: Gait belt;Rolling walker (2 wheels)  Activity Tolerance: Patient tolerated treatment well Patient left: in chair;with call bell/phone within reach;with chair alarm set  OT Visit  Diagnosis: Unsteadiness on feet (R26.81);Other abnormalities of gait and mobility (R26.89);Muscle weakness (generalized) (M62.81)                Time: 1003-4961 OT Time Calculation (min): 35 min Charges:  OT General Charges $OT Visit: 1 Visit OT Evaluation $OT Eval Moderate Complexity: 1 Mod OT Treatments $Self Care/Home Management : 8-22 mins  Rennie Plowman, MS Acute Rehabilitation Department Office# [phone]   Feliz Beam Orey Moure 07/26/2022, 1:06 PM

## 2022-07-26 NOTE — Telephone Encounter (Signed)
Pharmacy Patient Advocate Encounter  Insurance verification completed.    The patient is insured through [employer] Part D   The patient is currently admitted and ran test claims for the following: vancomycin .  Copays and coinsurance results were relayed to Inpatient clinical team.

## 2022-07-27 DIAGNOSIS — K5792 Diverticulitis of intestine, part unspecified, without perforation or abscess without bleeding: Secondary | ICD-10-CM | POA: Diagnosis not present

## 2022-07-27 LAB — CBC
HCT: 27.5 % — ABNORMAL LOW (ref 36.0–46.0)
Hemoglobin: 9 g/dL — ABNORMAL LOW (ref 12.0–15.0)
MCH: 29.8 pg (ref 26.0–34.0)
MCHC: 32.7 g/dL (ref 30.0–36.0)
MCV: 91.1 fL (ref 80.0–100.0)
Platelets: 237 10*3/uL (ref 150–400)
RBC: 3.02 MIL/uL — ABNORMAL LOW (ref 3.87–5.11)
RDW: 15.1 % (ref 11.5–15.5)
WBC: 13.9 10*3/uL — ABNORMAL HIGH (ref 4.0–10.5)
nRBC: 0 % (ref 0.0–0.2)

## 2022-07-27 LAB — GLUCOSE, CAPILLARY
Glucose-Capillary: 107 mg/dL — ABNORMAL HIGH (ref 70–99)
Glucose-Capillary: 108 mg/dL — ABNORMAL HIGH (ref 70–99)
Glucose-Capillary: 134 mg/dL — ABNORMAL HIGH (ref 70–99)
Glucose-Capillary: 116 mg/dL — ABNORMAL HIGH (ref 70–99)

## 2022-07-27 LAB — BASIC METABOLIC PANEL
Anion gap: 7 (ref 5–15)
BUN: 33 mg/dL — ABNORMAL HIGH (ref 8–23)
CO2: 21 mmol/L — ABNORMAL LOW (ref 22–32)
Calcium: 8.4 mg/dL — ABNORMAL LOW (ref 8.9–10.3)
Chloride: 110 mmol/L (ref 98–111)
Creatinine, Ser: 1.56 mg/dL — ABNORMAL HIGH (ref 0.44–1.00)
GFR, Estimated: 33 mL/min — ABNORMAL LOW (ref >60)
Glucose, Bld: 123 mg/dL — ABNORMAL HIGH (ref 70–99)
Potassium: 3.3 mmol/L — ABNORMAL LOW (ref 3.5–5.1)
Sodium: 138 mmol/L (ref 135–145)

## 2022-07-27 MED ORDER — POTASSIUM CHLORIDE CRYS ER 20 MEQ PO TBCR
40.0000 meq | EXTENDED_RELEASE_TABLET | Freq: Once | ORAL | Status: AC
  Administered 2022-07-27: 40 meq via ORAL
  Filled 2022-07-27: qty 2

## 2022-07-27 NOTE — Care Management Important Message (Signed)
Important Message  Patient Details IM Letter given to the Patient. Name: Diana Harper MRN: [id number] Date of Birth: 1936-09-29   Medicare Important Message Given:  Yes     Kerin Salen 07/27/2022, 8:58 AM

## 2022-07-27 NOTE — Progress Notes (Signed)
PROGRESS NOTE    [name]  OEU:[id number] DOB: [birth year] DOA: 07/24/2022 PCP: [name], MD    Brief Narrative:  Diana Harper is a 86 y.o. female with medical history significant of deficiency anemia, anxiety, osteoarthritis, asthma, cholelithiasis, biliary colic, stage IIIb CKD, colon polyps, gastric ulcer, glaucoma, hyperlipidemia, hypertension, hypothyroidism, type 2 diabetes, history of CVA, vitamin D deficiency peripheral neuropathy who has been recently admitted and discharged for acute diverticulitis who is returning to the emergency department today with abdominal pain, nausea and multiple loose stools episodes.  Found to have c diff.  Slowly improving   Assessment and Plan: C diff colitis -recent tx for diverticulitis  -PO vanc Analgesics and antiemetics as needed. -advance diet to soft -home in AM     CKD stage 3 due to type 2 diabetes mellitus (HCC) -Cr improved      Essential hypertension -resume Catapres 0.2 mg patch. When BP supports Monitor BP and heart rate.     Hypothyroidism Continue levothyroxine 88 mcg p.o. daily.   Hypokalemia -replete    Depression Continue bupropion 150 mg p.o. every 24 hours. OP follow-up with behavioral health and/or PCP.     COPD (chronic obstructive pulmonary disease) (HCC) Continue Trelegy Ellipta daily.     DM type 2 (diabetes mellitus, type 2) (HCC) CBG monitoring with RI SS.     Normocytic anemia Monitor hematocrit and hemoglobin.   DVT prophylaxis: SCDs Start: 07/24/22 1516    Code Status: Full Code Family Communication: spoke with daughter on phone  Disposition Plan:  Level of care: Telemetry Status is: Inpatient Remains inpatient appropriate because: treatment of c diff    Consultants:  none   Subjective: No abdominal pain, less BMs  Objective: Vitals:   07/26/22 1455 07/26/22 2044 07/27/22 0530 07/27/22 0827  BP: (!) 125/58 115/64 135/69   Pulse: 92 89 83 80  Resp: '16 19 20 18   '$ Temp: 99.6 F (37.6 C) 99.5 F (37.5 C) 98.6 F (37 C)   TempSrc: Oral     SpO2: 97% 100% 96% 97%  Weight:      Height:        Intake/Output Summary (Last 24 hours) at 07/27/2022 1236 Last data filed at 07/27/2022 1000 Gross per 24 hour  Intake 600 ml  Output --  Net 600 ml    Filed Weights   07/24/22 1415  Weight: 72.6 kg    Examination:    General: Appearance:     Overweight female in no acute distress, mild tremors     Lungs:     respirations unlabored  Heart:    Normal heart rate.   MS:   All extremities are intact.   Neurologic:   Awake, alert         Data Reviewed: I have personally reviewed following labs and imaging studies  CBC: Recent Labs  Lab 07/24/22 1205 07/24/22 1313 07/25/22 0557 07/26/22 0449 07/27/22 0622  WBC 14.0*  --  12.7* 15.1* 13.9*  NEUTROABS 12.3*  --   --   --   --   HGB 10.7* 11.2* 9.5* 10.2* 9.0*  HCT 32.8* 33.0* 28.6* 31.9* 27.5*  MCV 92.1  --  91.4 93.5 91.1  PLT 230  --  193 238 154   Basic Metabolic Panel: Recent Labs  Lab 07/24/22 1313 07/25/22 0557 07/26/22 0449 07/27/22 0622  NA 139 139 138 138  K 3.5 3.3* 3.5 3.3*  CL 102 110 109 110  CO2  --  21*  23 21*  GLUCOSE 119* 138* 167* 123*  BUN 32* 27* 28* 33*  CREATININE 1.90* 1.59* 1.64* 1.56*  CALCIUM  --  8.4* 8.4* 8.4*   GFR: Estimated Creatinine Clearance: 27.9 mL/min (A) (by C-G formula based on SCr of 1.56 mg/dL (H)). Liver Function Tests: Recent Labs  Lab 07/25/22 0557  AST 23  ALT 28  ALKPHOS 96  BILITOT 0.6  PROT 5.9*  ALBUMIN 2.8*   Recent Labs  Lab 07/24/22 1205  LIPASE 26   No results for input(s): "AMMONIA" in the last 168 hours. Coagulation Profile: No results for input(s): "INR", "PROTIME" in the last 168 hours. Cardiac Enzymes: No results for input(s): "CKTOTAL", "CKMB", "CKMBINDEX", "TROPONINI" in the last 168 hours. BNP (last 3 results) No results for input(s): "PROBNP" in the last 8760 hours. HbA1C: No results for  input(s): "HGBA1C" in the last 72 hours. CBG: Recent Labs  Lab 07/26/22 0956 07/26/22 1235 07/26/22 1734 07/26/22 2046 07/27/22 0856  GLUCAP 167* 156* 162* 119* 107*   Lipid Profile: No results for input(s): "CHOL", "HDL", "LDLCALC", "TRIG", "CHOLHDL", "LDLDIRECT" in the last 72 hours. Thyroid Function Tests: No results for input(s): "TSH", "T4TOTAL", "FREET4", "T3FREE", "THYROIDAB" in the last 72 hours. Anemia Panel: No results for input(s): "VITAMINB12", "FOLATE", "FERRITIN", "TIBC", "IRON", "RETICCTPCT" in the last 72 hours. Sepsis Labs: Recent Labs  Lab 07/24/22 1205  LATICACIDVEN 1.2    Recent Results (from the past 240 hour(s))  C Difficile Quick Screen w PCR reflex     Status: Abnormal   Collection Time: 07/24/22  7:54 AM   Specimen: STOOL  Result Value Ref Range Status   C Diff antigen POSITIVE (A) NEGATIVE Final   C Diff toxin POSITIVE (A) NEGATIVE Final   C Diff interpretation Toxin producing C. difficile detected.  Final    Comment: CRITICAL RESULT CALLED TO, READ BACK BY AND VERIFIED WITH: LOGAN RN AT 0909 ON 8.27.23 BY S.VANHOORNE Performed at [facility] [address], [place], [place] [address]          Radiology Studies: No results found.      Scheduled Meds:  buPROPion  150 mg Oral Daily   feeding supplement  1 Container Oral TID BM   fluticasone furoate-vilanterol  1 puff Inhalation Daily   And   umeclidinium bromide  1 puff Inhalation Daily   insulin aspart  0-15 Units Subcutaneous TID WC   insulin aspart  0-5 Units Subcutaneous QHS   levothyroxine  88 mcg Oral Q0600   pantoprazole  40 mg Oral Daily   prochlorperazine  5 mg Intravenous Once   sertraline  150 mg Oral QHS   vancomycin  125 mg Oral QID   Continuous Infusions:     LOS: 3 days    Time spent: 45 minutes spent on chart review, discussion with nursing staff, consultants, updating family and interview/physical exam; more than 50% of that time was  spent in counseling and/or coordination of care.    Geradine Girt, DO [facility] Available via Epic secure chat 7am-7pm After these hours, please refer to coverage provider listed on [email or web address] 07/27/2022, 12:36 PM

## 2022-07-27 NOTE — TOC Initial Note (Signed)
Transition of Care [facility]) - Initial/Assessment Note    Patient Details  Name: Diana Harper MRN: [id number] Date of Birth: [birth year]  Transition of [facility]) CM/SW Contact:    Vassie Moselle, LCSW Phone Number: 07/27/2022, 10:48 AM  Clinical Narrative:                 Met with pt and confirmed plan for home health PT/OT. Pt states she is currently active with [facility] for HHPT/OT and would like to continue with their services. CSW confirmed [place] services with Claiborne Billings at [facility] Pt will need HH orders placed prior to discharge. No DME needs identified.   Expected Discharge Plan: [place] Barriers to Discharge: Continued Medical Work up   Patient Goals and CMS Choice Patient states their goals for this hospitalization and ongoing recovery are:: To return home with home health   Choice offered to / list presented to : Patient  Expected Discharge Plan and Services Expected Discharge Plan: [place] In-house Referral: NA Discharge Planning Services: CM Consult Post Acute Care Choice: [place] arrangements for the past 2 months: Single Family Home                 DME Arranged: N/A DME Agency: NA       HH Arranged: PT, OT HH Agency: [place] Date [place]: 07/27/22 Time HH Agency Contacted: 51 Representative spoke with at [place]: Claiborne Billings  Prior Living Arrangements/Services Living arrangements for the past 2 months: [place] with:: Adult Children Patient language and need for interpreter reviewed:: Yes Do you feel safe going back to the place where you live?: Yes      Need for Family Participation in Patient Care: No (Comment) Care giver support system in place?: Yes (comment) Current home services: Home OT, Home PT, DME Criminal Activity/Legal Involvement Pertinent to Current Situation/Hospitalization: No - Comment as needed  Activities of Daily Living Home Assistive  Devices/Equipment: Cane (specify quad or straight) ADL Screening (condition at time of admission) Patient's cognitive ability adequate to safely complete daily activities?: No Is the patient deaf or have difficulty hearing?: Yes Does the patient have difficulty seeing, even when wearing glasses/contacts?: Yes Does the patient have difficulty concentrating, remembering, or making decisions?: Yes Patient able to express need for assistance with ADLs?: Yes Does the patient have difficulty dressing or bathing?: Yes Independently performs ADLs?: No Communication: Independent Dressing (OT): Needs assistance Is this a change from baseline?: Pre-admission baseline Grooming: Independent Feeding: Independent Bathing: Needs assistance Is this a change from baseline?: Pre-admission baseline Toileting: Needs assistance Is this a change from baseline?: Pre-admission baseline In/Out Bed: Needs assistance Is this a change from baseline?: Pre-admission baseline Walks in Home: Needs assistance Is this a change from baseline?: Pre-admission baseline Does the patient have difficulty walking or climbing stairs?: Yes Weakness of Legs: Left Weakness of Arms/Hands: Left  Permission Sought/Granted   Permission granted to share information with : No              Emotional Assessment Appearance:: Appears stated age Attitude/Demeanor/Rapport: Engaged, Charismatic Affect (typically observed): Accepting, Pleasant Orientation: : Oriented to Self, Oriented to Place, Oriented to  Time, Oriented to Situation Alcohol / Substance Use: Not Applicable Psych Involvement: No (comment)  Admission diagnosis:  Diverticulitis [K57.92] Acute diverticulitis [K57.92] Diarrhea, unspecified type [R19.7] Patient Active Problem List   Diagnosis Date Noted   C. difficile colitis 07/25/2022   Normocytic anemia 07/24/2022  DM type 2 (diabetes mellitus, type 2) ([place]) 07/17/2022   Acute diverticulitis 04/06/2021   CKD  stage 3 due to type 2 diabetes mellitus ([place]) 04/06/2021   Essential hypertension 04/06/2021   Hypothyroidism    Depression    COPD (chronic obstructive pulmonary disease) ([place])    Anorexia 02/11/2021   Bloating symptom 02/11/2021   History of colon polyps 02/09/2021   Hemorrhoids 02/09/2021   Rectal bleeding 02/09/2021   Fecal smearing 02/09/2021   AKI (acute kidney injury) ([place]) 12/13/2019   Acute kidney injury ([place]) 12/12/2019   Diarrhea 12/12/2019   Dehydration 12/12/2019   Cerebral thrombosis with cerebral infarction 10/10/2017   Cerebral embolism with cerebral infarction 10/10/2017   Subarachnoid hemorrhage 10/10/2017   Intracerebral hemorrhage 10/10/2017   CVA (cerebral vascular accident) ([place]) 10/10/2017   Chronic cholecystitis with calculus 08/25/2016   Syncope 11/15/2011   Diverticulitis 11/15/2011   PCP:  [name], MD Pharmacy:   [facility] [address] [place] [address] Phone: [phone] Fax: [phone]     Social Determinants of Health (SDOH) Interventions    Readmission Risk Interventions    07/27/2022   10:46 AM  Readmission Risk Prevention Plan  Transportation Screening Complete  PCP or Specialist Appt within 3-5 Days Complete  HRI or [place] Complete  Social Work Consult for [place] Planning/Counseling Complete  Palliative Care Screening Not Applicable  Medication Review Press photographer) Complete

## 2022-07-27 NOTE — Consult Note (Signed)
   [facility] Inpatient Consult   07/27/2022  Kijuana Ruppel 06-03-1936 [id number]  [place] Organization [ACO] Patient: Medicare [facility] Liaison coverage for patient at [facility] for remote review   Primary Care Provider:  Rachell Cipro, MD   Patient is currently active with [facility] for chronic disease management services.  Patient has been engaged by a [place] team.  Our community based plan of care has focused on disease management and community resource support.    Patient will receive a post hospital call and will be evaluated for assessments and disease process education.    Plan: Will continue to follow for additional disposition and transitional care needs, as [facility] Care Coordination team was active prior to admission. Currently, inpatient [facility] team notes indicates home with home health.   Of note, [facility] Care Management services does not replace or interfere with any services that are needed or arranged by inpatient [facility] care management team.  For additional questions or referrals please contact:  Natividad Brood, RN BSN [facility] Liaison  [phone] business mobile phone Toll free office [phone]  Fax number: [phone] [email or web address]

## 2022-07-27 NOTE — Evaluation (Signed)
Physical Therapy Evaluation Patient Details Name: Diana Harper MRN: [id number] DOB: [birth year] Today's Date: 07/27/2022  History of Present Illness  Patient presented to the hospital on 8/26 with nausea, increased loose stols and abdominal pain. patient was found to have C diff. of note paient recently discharged with diverticulitis.PMH: COPD, anxiety, osteoarthritis, asthma, cholelithiasis, biliary colic, stage IIIb CKD, colon polyps, gastric ulcer, glaucoma, hyperlipidemia, hypertension, hypothyroidism, type 2 diabetes, history of CVA, vitamin D deficiency peripheral neuropathy  Clinical Impression  Patient admitted for above problems. Patient received seated on BSc. Patient only able to ambulate to recliner for fear of more loose BM. Patient  should progress to return home with family assistance.  Pt admitted with above diagnosis.  Pt currently with functional limitations due to the deficits listed below (see PT Problem List). Pt will benefit from skilled PT to increase their independence and safety with mobility to allow discharge to the venue listed below.          Recommendations for follow up therapy are one component of a multi-disciplinary discharge planning process, led by the attending physician.  Recommendations may be updated based on patient status, additional functional criteria and insurance authorization.  Follow Up Recommendations Home health PT      Assistance Recommended at Discharge Intermittent Supervision/Assistance  Patient can return home with the following  A little help with walking and/or transfers;A little help with bathing/dressing/bathroom;Assistance with cooking/housework;Assist for transportation;Help with stairs or ramp for entrance    Equipment Recommendations None recommended by PT  Recommendations for Other Services       Functional Status Assessment Patient has had a recent decline in their functional status and demonstrates the ability to make  significant improvements in function in a reasonable and predictable amount of time.     Precautions / Restrictions Precautions Precaution Comments: urgent diarrhea      Mobility  Bed Mobility               General bed mobility comments: on BSC, left in recliner    Transfers Overall transfer level: Needs assistance Equipment used: Rolling walker (2 wheels) Transfers: Sit to/from Stand Sit to Stand: Min guard, Min assist           General transfer comment: from [facility] x 2 with arm rests    Ambulation/Gait Ambulation/Gait assistance: Min guard Gait Distance (Feet): 5 Feet Assistive device: Rolling walker (2 wheels) Gait Pattern/deviations: Step-to pattern Gait velocity: decr     General Gait Details: use of RW from [facility] to recliner, pt. did not want farther due to diarrhea  Stairs            Wheelchair Mobility    Modified Rankin (Stroke Patients Only)       Balance Overall balance assessment: Mild deficits observed, not formally tested                                           Pertinent Vitals/Pain Pain Assessment Pain Assessment: Faces Faces Pain Scale: Hurts whole lot Pain Location: rectum from BM's Pain Descriptors / Indicators: Burning, Discomfort, Moaning Pain Intervention(s): Monitored during session (CNA placed cream)    Home Living Family/patient expects to be discharged to:: Private residence Living Arrangements: Children Available Help at Discharge: Family;Available 24 hours/day Type of Home: House Home Access: Stairs to enter Entrance Stairs-Rails: Chemical engineer of Steps: 2-3   Home Layout: One  level Home Equipment: Rolling Walker (2 wheels);BSC/3in1;Grab bars - toilet;Shower seat;Cane - single point      Prior Function Prior Level of Function : Needs assist       Physical Assist : ADLs (physical)     Mobility Comments: amb with Rw in home ADLs Comments: family assists     Hand  Dominance        Extremity/Trunk Assessment   Upper Extremity Assessment Upper Extremity Assessment: Defer to OT evaluation    Lower Extremity Assessment Lower Extremity Assessment: Generalized weakness    Cervical / Trunk Assessment Cervical / Trunk Assessment: Normal  Communication   Communication: Expressive difficulties  Cognition Arousal/Alertness: Awake/alert Behavior During Therapy: WFL for tasks assessed/performed Overall Cognitive Status: Within Functional Limits for tasks assessed                                 General Comments: no family present, follows directions,        General Comments      Exercises     Assessment/Plan    PT Assessment Patient needs continued PT services  PT Problem List Decreased strength;Decreased mobility;Decreased knowledge of precautions;Decreased activity tolerance;Pain       PT Treatment Interventions DME instruction;Therapeutic activities;Gait training;Therapeutic exercise;Patient/family education;Functional mobility training;Balance training    PT Goals (Current goals can be found in the Care Plan section)  Acute Rehab PT Goals Patient Stated Goal: not have diarrhea, get strnegth PT Goal Formulation: With patient Time For Goal Achievement: 08/10/22 Potential to Achieve Goals: Good    Frequency Min 3X/week     Co-evaluation               AM-PAC PT "6 Clicks" Mobility  Outcome Measure Help needed turning from your back to your side while in a flat bed without using bedrails?: A Little Help needed moving from lying on your back to sitting on the side of a flat bed without using bedrails?: A Little Help needed moving to and from a bed to a chair (including a wheelchair)?: A Little Help needed standing up from a chair using your arms (e.g., wheelchair or bedside chair)?: A Little Help needed to walk in hospital room?: A Little Help needed climbing 3-5 steps with a railing? : A Lot 6 Click Score:  17    End of Session   Activity Tolerance: Patient tolerated treatment well;Treatment limited secondary to medical complications (Comment) Patient left: in chair;with call bell/phone within reach;with chair alarm set Nurse Communication: Mobility status PT Visit Diagnosis: Unsteadiness on feet (R26.81);Pain    Time: 7829-5621 PT Time Calculation (min) (ACUTE ONLY): 20 min   Charges:   PT Evaluation $PT Eval Low Complexity: 1 Low          [place] [facility] [phone] Weekend [phone]   Claretha Cooper 07/27/2022, 9:15 AM

## 2022-07-28 DIAGNOSIS — K5792 Diverticulitis of intestine, part unspecified, without perforation or abscess without bleeding: Secondary | ICD-10-CM | POA: Diagnosis not present

## 2022-07-28 DIAGNOSIS — A0472 Enterocolitis due to Clostridium difficile, not specified as recurrent: Secondary | ICD-10-CM | POA: Diagnosis not present

## 2022-07-28 LAB — GLUCOSE, CAPILLARY
Glucose-Capillary: 114 mg/dL — ABNORMAL HIGH (ref 70–99)
Glucose-Capillary: 167 mg/dL — ABNORMAL HIGH (ref 70–99)

## 2022-07-28 MED ORDER — CIPROFLOXACIN HCL 500 MG PO TABS: 500.0000 mg | ORAL_TABLET | Freq: Two times a day (BID) | ORAL | 0 refills | Status: AC

## 2022-07-28 MED ORDER — VANCOMYCIN HCL 125 MG PO CAPS: 125.0000 mg | ORAL_CAPSULE | Freq: Four times a day (QID) | ORAL | 0 refills | Status: AC

## 2022-07-28 MED ORDER — METRONIDAZOLE 500 MG PO TABS: 500.0000 mg | ORAL_TABLET | Freq: Three times a day (TID) | ORAL | 0 refills | Status: AC

## 2022-07-28 NOTE — Discharge Summary (Signed)
Physician Discharge Summary   Diana Harper HYQ:[id number] DOB: [birth year] DOA: 07/24/2022  PCP: [name], MD  Admit date: 07/24/2022 Discharge date: 07/28/2022  Barriers to discharge: none  Admitted From: Home Disposition:  Home Discharging physician: Dwyane Dee, MD  Recommendations for Outpatient Follow-up:  Follow up with PCP  Home Health: PT/OT Equipment/Devices: none  Discharge Condition: stable CODE STATUS: Full Diet recommendation:  Diet Orders (From admission, onward)     Start     Ordered   07/26/22 0958  DIET SOFT Room service appropriate? Yes; Fluid consistency: Thin  Diet effective now       Question Answer Comment  Room service appropriate? Yes   Fluid consistency: Thin      07/26/22 0957            Hospital Course:  C diff colitis - recent treatment with cefepime and Flagyl during last hospitalization with transition to Augmentin at discharge - C. difficile positive on admission for C. difficile antigen and toxin -Patient continued on oral vancomycin at discharge to overlap with continued diverticulitis course and 7 days further of vancomycin alone to complete course - Discussed return instructions with daughter notably any increase in abdominal pain or no resolution of diarrhea  Acute sigmoid diverticulitis - This has been seen on imaging on 07/15/2022 and again on readmission on 07/24/2022 - She was previously treated with 2 days of cefepime and Flagyl during last hospitalization and discharged on Augmentin but only appears to have completed half of her course; further diverticulitis treatment was not continued during this hospitalization but due to her ongoing pain and lack of course completion, she was resumed back on therapy with Cipro and Flagyl at discharge to complete 7 more days uninterrupted along with concomitant use of oral vancomycin due to ongoing C. difficile   CKD3b -- patient has history of CKD3b. Baseline creat ~ 1.4 - 1.5,  eGFR 32 -Creatinine remained at baseline, 1.56 at time of discharge   Essential hypertension -Home regimen continued at discharge   Hypothyroidism Continue levothyroxine 88 mcg p.o. daily.   Hypokalemia -repleted   Depression Continue bupropion 150 mg p.o. every 24 hours. OP follow-up with behavioral health and/or PCP.   COPD (chronic obstructive pulmonary disease) (HCC) Continue Trelegy Ellipta daily.   DM type 2 (diabetes mellitus, type 2) (HCC) -Continue home regimen at discharge   Normocytic anemia -Baseline appears to be around 9 to 10 g/dL - Remained at baseline   The patient's chronic medical conditions were treated accordingly per the patient's home medication regimen except as noted.  On day of discharge, patient was felt deemed stable for discharge. Patient/family member advised to call PCP or come back to ER if needed.   Principal Diagnosis: Acute diverticulitis  Discharge Diagnoses: Active Hospital Problems   Diagnosis Date Noted   Acute diverticulitis 04/06/2021   C. difficile colitis 07/25/2022   Normocytic anemia 07/24/2022   DM type 2 (diabetes mellitus, type 2) ([place]) 07/17/2022   CKD stage 3 due to type 2 diabetes mellitus ([place]) 04/06/2021   Essential hypertension 04/06/2021   COPD (chronic obstructive pulmonary disease) ([place])    Hypothyroidism    Depression     Resolved Hospital Problems  No resolved problems to display.     Discharge Instructions     Increase activity slowly   Complete by: As directed       Allergies as of 07/28/2022       Reactions   Latex Anaphylaxis, Shortness Of Breath, Other (  See Comments)   Severe respiratory distress   Onion Diarrhea   Other Other (See Comments)   Patient cannot have TREE NUTS and anything with seeds- History of  diverticulitis    Flexeril [cyclobenzaprine] Other (See Comments)   Caused agitation   Lisinopril Cough   Metformin And Related Diarrhea   Simvastatin Hives, Rash   Tetracycline  Rash   Zithromax [azithromycin] Rash        Medication List     STOP taking these medications    amoxicillin-clavulanate 500-125 MG tablet Commonly known as: AUGMENTIN   diclofenac Sodium 1 % Gel Commonly known as: VOLTAREN   omeprazole 40 MG capsule Commonly known as: PRILOSEC       TAKE these medications    acetaminophen 500 MG tablet Commonly known as: TYLENOL Take 1,000 mg by mouth every 8 (eight) hours as needed for mild pain, headache or fever.   Bayer Low Dose 81 MG tablet Generic drug: aspirin EC Take 81 mg by mouth daily.   buPROPion 150 MG 24 hr tablet Commonly known as: WELLBUTRIN XL Take 150 mg by mouth daily.   CALTRATE 600 PO Take 600 mg by mouth daily.   ciprofloxacin 500 MG tablet Commonly known as: CIPRO Take 1 tablet (500 mg total) by mouth 2 (two) times daily for 7 days.   cloNIDine 0.2 mg/24hr patch Commonly known as: CATAPRES - Dosed in mg/24 hr Place 0.2 mg onto the skin every Wednesday.   dicyclomine 20 MG tablet Commonly known as: BENTYL Take 20 mg by mouth 2 (two) times daily as needed for spasms.   FreeStyle Libre 14 Day Sensor Misc Inject 1 Device into the skin every 14 (fourteen) days.   Glucagon HCl 1 MG Solr Inject 1 mg into the skin as needed (for a blood sugar <67).   insulin degludec 200 UNIT/ML FlexTouch Pen Commonly known as: TRESIBA Inject 72 Units into the skin See admin instructions. Inject 72 units into the skin daily before brunch   levothyroxine 88 MCG tablet Commonly known as: SYNTHROID Take 88 mcg by mouth daily before breakfast.   loratadine 10 MG tablet Commonly known as: CLARITIN Take 10 mg by mouth daily.   metroNIDAZOLE 500 MG tablet Commonly known as: Flagyl Take 1 tablet (500 mg total) by mouth 3 (three) times daily for 7 days.   NexIUM 40 MG capsule Generic drug: esomeprazole Take 40 mg by mouth daily as needed (for reflux).   nitroGLYCERIN 0.4 MG SL tablet Commonly known as:  NITROSTAT Place 1 tablet (0.4 mg total) under the tongue every 5 (five) minutes as needed for chest pain.   NovoLOG FlexPen 100 UNIT/ML FlexPen Generic drug: insulin aspart Inject 5-10 Units into the skin See admin instructions. Inject 5 units into the skin before meals if BGL is 100-150, 7 units if BGL is 151-200, and 10 units if BGL is greater than 200   sertraline 50 MG tablet Commonly known as: ZOLOFT Take 150 mg by mouth at bedtime.   tamsulosin 0.4 MG Caps capsule Commonly known as: FLOMAX Take 0.4 mg by mouth daily.   Trelegy Ellipta 200-62.5-25 MCG/ACT Aepb Generic drug: Fluticasone-Umeclidin-Vilant Inhale 1 puff into the lungs daily as needed (for flares).   vancomycin 125 MG capsule Commonly known as: VANCOCIN Take 1 capsule (125 mg total) by mouth 4 (four) times daily for 14 days.   Vitamin D3 25 MCG (1000 UT) Caps Take 1,000 Units by mouth daily.        Follow-up  Information     [facility].   Specialty: Home Health Services Why: [facility] will continue to provide home health physical and occupational therapy at discharge. Contact information: [address] [place] [address] [phone]                Allergies  Allergen Reactions   Latex Anaphylaxis, Shortness Of Breath and Other (See Comments)    Severe respiratory distress   Onion Diarrhea   Other Other (See Comments)    Patient cannot have TREE NUTS and anything with seeds- History of  diverticulitis    Flexeril [Cyclobenzaprine] Other (See Comments)    Caused agitation   Lisinopril Cough   Metformin And Related Diarrhea   Simvastatin Hives and Rash   Tetracycline Rash   Zithromax [Azithromycin] Rash    Consultations:   Procedures:   Discharge Exam: BP (!) 144/94 (BP Location: Right Arm)   Pulse 74   Temp 98.9 F (37.2 C)   Resp 20   Ht 5' 6"  (1.676 m)   Wt 72.6 kg   SpO2 98%   BMI 25.82 kg/m  Physical Exam Constitutional:       Appearance: Normal appearance.  HENT:     Head: Normocephalic and atraumatic.     Mouth/Throat:     Mouth: Mucous membranes are moist.  Eyes:     Extraocular Movements: Extraocular movements intact.  Cardiovascular:     Rate and Rhythm: Normal rate and regular rhythm.  Pulmonary:     Effort: Pulmonary effort is normal.     Breath sounds: Normal breath sounds.  Abdominal:     General: Bowel sounds are normal. There is no distension.     Palpations: Abdomen is soft.     Tenderness: There is abdominal tenderness in the left lower quadrant. There is no guarding or rebound.  Musculoskeletal:        General: Normal range of motion.     Cervical back: Normal range of motion and neck supple.  Skin:    General: Skin is warm and dry.  Neurological:     Mental Status: She is alert. Mental status is at baseline.  Psychiatric:        Mood and Affect: Mood normal.      The results of significant diagnostics from this hospitalization (including imaging, microbiology, ancillary and laboratory) are listed below for reference.   Microbiology: Recent Results (from the past 240 hour(s))  C Difficile Quick Screen w PCR reflex     Status: Abnormal   Collection Time: 07/24/22  7:54 AM   Specimen: STOOL  Result Value Ref Range Status   C Diff antigen POSITIVE (A) NEGATIVE Final   C Diff toxin POSITIVE (A) NEGATIVE Final   C Diff interpretation Toxin producing C. difficile detected.  Final    Comment: CRITICAL RESULT CALLED TO, READ BACK BY AND VERIFIED WITH: LOGAN RN AT 0909 ON 8.27.23 BY S.VANHOORNE Performed at [facility] [address] [place], [place] [address]      Labs: BNP (last 3 results) No results for input(s): "BNP" in the last 8760 hours. Basic Metabolic Panel: Recent Labs  Lab 07/24/22 1313 07/25/22 0557 07/26/22 0449 07/27/22 0622  NA 139 139 138 138  K 3.5 3.3* 3.5 3.3*  CL 102 110 109 110  CO2  --  21* 23 21*  GLUCOSE 119* 138* 167* 123*  BUN  32* 27* 28* 33*  CREATININE 1.90* 1.59* 1.64* 1.56*  CALCIUM  --  8.4* 8.4* 8.4*   Liver Function Tests: Recent Labs  Lab 07/25/22 0557  AST 23  ALT 28  ALKPHOS 96  BILITOT 0.6  PROT 5.9*  ALBUMIN 2.8*   Recent Labs  Lab 07/24/22 1205  LIPASE 26   No results for input(s): "AMMONIA" in the last 168 hours. CBC: Recent Labs  Lab 07/24/22 1205 07/24/22 1313 07/25/22 0557 07/26/22 0449 07/27/22 0622  WBC 14.0*  --  12.7* 15.1* 13.9*  NEUTROABS 12.3*  --   --   --   --   HGB 10.7* 11.2* 9.5* 10.2* 9.0*  HCT 32.8* 33.0* 28.6* 31.9* 27.5*  MCV 92.1  --  91.4 93.5 91.1  PLT 230  --  193 238 237   Cardiac Enzymes: No results for input(s): "CKTOTAL", "CKMB", "CKMBINDEX", "TROPONINI" in the last 168 hours. BNP: Invalid input(s): "POCBNP" CBG: Recent Labs  Lab 07/27/22 1237 07/27/22 1833 07/27/22 2156 07/28/22 0736 07/28/22 1139  GLUCAP 108* 134* 116* 114* 167*   D-Dimer No results for input(s): "DDIMER" in the last 72 hours. Hgb A1c No results for input(s): "HGBA1C" in the last 72 hours. Lipid Profile No results for input(s): "CHOL", "HDL", "LDLCALC", "TRIG", "CHOLHDL", "LDLDIRECT" in the last 72 hours. Thyroid function studies No results for input(s): "TSH", "T4TOTAL", "T3FREE", "THYROIDAB" in the last 72 hours.  Invalid input(s): "FREET3" Anemia work up No results for input(s): "VITAMINB12", "FOLATE", "FERRITIN", "TIBC", "IRON", "RETICCTPCT" in the last 72 hours. Urinalysis    Component Value Date/Time   COLORURINE YELLOW 07/15/2022 1034   APPEARANCEUR CLEAR 07/15/2022 1034   LABSPEC 1.018 07/15/2022 1034   PHURINE 5.0 07/15/2022 1034   GLUCOSEU NEGATIVE 07/15/2022 1034   HGBUR NEGATIVE 07/15/2022 1034   BILIRUBINUR NEGATIVE 07/15/2022 1034   KETONESUR NEGATIVE 07/15/2022 1034   PROTEINUR NEGATIVE 07/15/2022 1034   UROBILINOGEN 0.2 04/15/2012 1217   NITRITE NEGATIVE 07/15/2022 1034   LEUKOCYTESUR NEGATIVE 07/15/2022 1034   Sepsis Labs Recent Labs   Lab 07/24/22 1205 07/25/22 0557 07/26/22 0449 07/27/22 0622  WBC 14.0* 12.7* 15.1* 13.9*   Microbiology Recent Results (from the past 240 hour(s))  C Difficile Quick Screen w PCR reflex     Status: Abnormal   Collection Time: 07/24/22  7:54 AM   Specimen: STOOL  Result Value Ref Range Status   C Diff antigen POSITIVE (A) NEGATIVE Final   C Diff toxin POSITIVE (A) NEGATIVE Final   C Diff interpretation Toxin producing C. difficile detected.  Final    Comment: CRITICAL RESULT CALLED TO, READ BACK BY AND VERIFIED WITH: LOGAN RN AT 0909 ON 8.27.23 BY S.VANHOORNE Performed at [facility] [address] [place], [place] [address]     Procedures/Studies: CT ABDOMEN PELVIS WO CONTRAST  Result Date: 07/24/2022 CLINICAL DATA:  Abdominal pain, acute, nonlocalized h/o diverticulitis EXAM: CT ABDOMEN AND PELVIS WITHOUT CONTRAST TECHNIQUE: Multidetector CT imaging of the abdomen and pelvis was performed following the standard protocol without IV contrast. RADIATION DOSE REDUCTION: This exam was performed according to the departmental dose-optimization program which includes automated exposure control, adjustment of the mA and/or kV according to patient size and/or use of iterative reconstruction technique. COMPARISON:  07/15/2022 FINDINGS: Lower chest: No acute abnormality. Hepatobiliary: No focal liver abnormality is seen. Status post cholecystectomy. No biliary dilatation. Pancreas: Unremarkable. No pancreatic ductal dilatation or surrounding inflammatory changes. Spleen: Normal in size without focal abnormality. Adrenals/Urinary Tract: Adrenal glands are unremarkable. Kidneys are normal, without renal calculi, focal lesion, or hydronephrosis. Bladder  is unremarkable. Stomach/Bowel: Findings of acute diverticulitis involving the sigmoid colon and distal descending colon with wall thickening, pericolonic fat stranding and trace fluid. No dilated loops of bowel. Stomach within  normal limits. Vascular/Lymphatic: Aortic atherosclerosis. No enlarged abdominal or pelvic lymph nodes. Reproductive: Status post hysterectomy. No adnexal masses. Other: No ascites. No organized abdominopelvic fluid collection. No pneumoperitoneum. Musculoskeletal: No new or acute bony findings. Scoliotic thoracolumbar curvature with multilevel spondylosis. IMPRESSION: 1. Acute uncomplicated diverticulitis of the sigmoid colon and distal descending colon. 2. Aortic atherosclerosis (ICD10-I70.0). Electronically Signed   By: Davina Poke D.O.   On: 07/24/2022 13:30   CT ABDOMEN PELVIS WO CONTRAST  Result Date: 07/15/2022 CLINICAL DATA:  Hervey Ard LEFT lower quadrant pain for 1 week, history of diverticulitis. EXAM: CT ABDOMEN AND PELVIS WITHOUT CONTRAST TECHNIQUE: Multidetector CT imaging of the abdomen and pelvis was performed following the standard protocol without IV contrast. RADIATION DOSE REDUCTION: This exam was performed according to the departmental dose-optimization program which includes automated exposure control, adjustment of the mA and/or kV according to patient size and/or use of iterative reconstruction technique. COMPARISON:  July 11, 2022 FINDINGS: Lower chest: Incidental imaging of the lung bases is unremarkable to the extent evaluated aside from mild basilar atelectasis and or scarring. Hepatobiliary: Smooth hepatic contours. No visible lesion on noncontrast imaging. Post cholecystectomy. Stable appearance of mild extrahepatic biliary duct distension following cholecystectomy. Pancreas: Normal contour.  No signs of inflammation. Spleen: Normal. Adrenals/Urinary Tract: Adrenal glands are normal. Smooth contour of the bilateral kidneys without signs of hydronephrosis or ureteral dilation. Foley catheter decompresses the urinary bladder. No gross stranding about the urinary bladder. No visible calculi. Stomach/Bowel: No acute gastric findings. Small bowel without dilation. The appendix is  normal. Scattered colonic diverticulosis in the proximal colon. Moderate diverticular disease of the sigmoid without query mild stranding about the descending/sigmoid junction in an area of diverticular change. Vascular/Lymphatic: Aortic atherosclerosis. No sign of aneurysm. Smooth contour of the IVC. There is no gastrohepatic or hepatoduodenal ligament lymphadenopathy. No retroperitoneal or mesenteric lymphadenopathy. No pelvic sidewall lymphadenopathy. Reproductive: Unremarkable by CT.  Post hysterectomy. Other: No ascites.  No pneumoperitoneum.  No sign of abscess. Musculoskeletal: Spinal degenerative changes. No acute or destructive bone findings. IMPRESSION: 1. Moderate diverticular disease of the sigmoid. Query mild stranding about the descending/sigmoid junction in an area of diverticular change. Findings could represent very mild diverticulitis. 2. Foley catheter decompresses the urinary bladder. 3. No signs of urinary tract obstruction or calculi. 4. Aortic atherosclerosis. Aortic Atherosclerosis (ICD10-I70.0). Electronically Signed   By: Zetta Bills M.D.   On: 07/15/2022 13:10   CT ABDOMEN PELVIS W CONTRAST  Result Date: 07/11/2022 CLINICAL DATA:  Right lower quadrant abdominal pain EXAM: CT ABDOMEN AND PELVIS WITH CONTRAST TECHNIQUE: Multidetector CT imaging of the abdomen and pelvis was performed using the standard protocol following bolus administration of intravenous contrast. RADIATION DOSE REDUCTION: This exam was performed according to the departmental dose-optimization program which includes automated exposure control, adjustment of the mA and/or kV according to patient size and/or use of iterative reconstruction technique. CONTRAST:  111m OMNIPAQUE IOHEXOL 300 MG/ML  SOLN COMPARISON:  CT 04/06/2021 FINDINGS: Lower chest: Scarring/atelectasis in the lower lobes. 4 mm pulmonary nodules in the right middle and lower lobe are unchanged from 2017 and should be benign. Hepatobiliary: Hepatic  steatosis. No suspicious focal hepatic lesion. Cholecystectomy. Mild dilation of the common bile duct likely related to reservoir effect post cholecystectomy. Pancreas: Unremarkable. No pancreatic ductal dilatation or surrounding inflammatory changes.  Spleen: Normal in size without focal abnormality. Adrenals/Urinary Tract: Adrenal glands are unremarkable. Kidneys are normal, without renal calculi, focal lesion, or hydronephrosis. Bladder is unremarkable. Stomach/Bowel: Stomach is within normal limits. Appendix appears normal. No evidence of bowel wall thickening, distention, or inflammatory changes. Diverticulosis without diverticulitis. Vascular/Lymphatic: Aortoiliac atherosclerotic calcification. No suspicious adenopathy. Reproductive: Unremarkable Other: No free intraperitoneal fluid or air. Musculoskeletal: No acute or significant osseous findings. IMPRESSION: No acute abnormality in the abdomen or pelvis. Hepatic steatosis. Diverticulosis without evidence diverticulitis. Aortic Atherosclerosis (ICD10-I70.0). Electronically Signed   By: Placido Sou M.D.   On: 07/11/2022 17:47     Time coordinating discharge: Over 30 minutes    Dwyane Dee, MD  [facility] 07/28/2022, 5:11 PM

## 2022-07-29 ENCOUNTER — Ambulatory Visit: Payer: Self-pay

## 2022-07-29 NOTE — Patient Outreach (Signed)
  Care Coordination   07/29/2022 Name: Diana Harper MRN: [id number] DOB: 1935/11/30   Care Coordination Outreach Attempts:  An unsuccessful telephone outreach was attempted today to offer the patient information about available care coordination services as a benefit of their health plan.   Follow Up Plan:  Additional outreach attempts will be made to offer the patient care coordination information and services.   Encounter Outcome:  No Answer  Care Coordination Interventions Activated:  No   Care Coordination Interventions:  No, not indicated    Barb Merino, RN, BSN, CCM Care Management Coordinator [facility] Care Management Direct Phone: [phone]

## 2022-07-30 DIAGNOSIS — I10 Essential (primary) hypertension: Secondary | ICD-10-CM | POA: Diagnosis not present

## 2022-07-30 DIAGNOSIS — I69354 Hemiplegia and hemiparesis following cerebral infarction affecting left non-dominant side: Secondary | ICD-10-CM | POA: Diagnosis not present

## 2022-07-30 DIAGNOSIS — R32 Unspecified urinary incontinence: Secondary | ICD-10-CM | POA: Diagnosis not present

## 2022-07-30 DIAGNOSIS — M19012 Primary osteoarthritis, left shoulder: Secondary | ICD-10-CM | POA: Diagnosis not present

## 2022-07-30 DIAGNOSIS — E1140 Type 2 diabetes mellitus with diabetic neuropathy, unspecified: Secondary | ICD-10-CM | POA: Diagnosis not present

## 2022-07-30 DIAGNOSIS — H9193 Unspecified hearing loss, bilateral: Secondary | ICD-10-CM | POA: Diagnosis not present

## 2022-08-05 DIAGNOSIS — A0472 Enterocolitis due to Clostridium difficile, not specified as recurrent: Secondary | ICD-10-CM | POA: Diagnosis not present

## 2022-08-05 DIAGNOSIS — K5792 Diverticulitis of intestine, part unspecified, without perforation or abscess without bleeding: Secondary | ICD-10-CM | POA: Diagnosis not present

## 2022-08-05 DIAGNOSIS — R634 Abnormal weight loss: Secondary | ICD-10-CM | POA: Diagnosis not present

## 2022-08-05 DIAGNOSIS — G8929 Other chronic pain: Secondary | ICD-10-CM | POA: Diagnosis not present

## 2022-08-05 DIAGNOSIS — R109 Unspecified abdominal pain: Secondary | ICD-10-CM | POA: Diagnosis not present

## 2022-08-06 ENCOUNTER — Ambulatory Visit: Payer: Self-pay

## 2022-08-06 DIAGNOSIS — E1140 Type 2 diabetes mellitus with diabetic neuropathy, unspecified: Secondary | ICD-10-CM | POA: Diagnosis not present

## 2022-08-06 DIAGNOSIS — R32 Unspecified urinary incontinence: Secondary | ICD-10-CM | POA: Diagnosis not present

## 2022-08-06 DIAGNOSIS — M19012 Primary osteoarthritis, left shoulder: Secondary | ICD-10-CM | POA: Diagnosis not present

## 2022-08-06 DIAGNOSIS — I10 Essential (primary) hypertension: Secondary | ICD-10-CM | POA: Diagnosis not present

## 2022-08-06 DIAGNOSIS — I69354 Hemiplegia and hemiparesis following cerebral infarction affecting left non-dominant side: Secondary | ICD-10-CM | POA: Diagnosis not present

## 2022-08-06 DIAGNOSIS — H9193 Unspecified hearing loss, bilateral: Secondary | ICD-10-CM | POA: Diagnosis not present

## 2022-08-06 NOTE — Patient Outreach (Addendum)
  Care Coordination   Initial Visit Note   08/06/2022 Name: Lin Hackmann MRN: [id number] DOB: Mar 24, 1936  Atziry Baranski is a 86 y.o. year old female who sees [name], MD for primary care. I spoke with  [name] and daughter [name] by phone today.  What matters to the patients health and wellness today?  Patient hopes to make a full recovery from her recent infection.     Goals Addressed     Patient Stated     I hope to make a full recovery from the infection I am being treated for (pt-stated)        Care Coordination Interventions: Evaluation of current treatment plan related to acute diverticulitis and C. difficile and patient's adherence to plan as established by provider Confirmed patient's home health services have started, PT/OT and SNV Discussed patient's daughter is staying with patient to assist with care and transportation Instructed patient to contact her doctor to report new symptoms or concerns Reviewed scheduled/upcoming provider appointments including PCP post discharge follow up scheduled for 08/05/22 (virtual visit), and 08/09/22 (face to face visit) with Carl Best NP    SDOH assessments and interventions completed:  No     Care Coordination Interventions Activated:  Yes  Care Coordination Interventions:  Yes, provided   Follow up plan: Follow up call scheduled for 09/03/22 '@09'$ :30 AM     Encounter Outcome:  Pt. Visit Completed

## 2022-08-06 NOTE — Patient Instructions (Signed)
Visit Information  Thank you for taking time to visit with me today. Please don't hesitate to contact me if I can be of assistance to you.   Following are the goals we discussed today:   Goals Addressed     Patient Stated     I hope to make a full recovery from the infection I am being treated for (pt-stated)        Care Coordination Interventions: Evaluation of current treatment plan related to acute diverticulitis and C. difficile and patient's adherence to plan as established by provider Confirmed patient's home health services have started, PT/OT and SNV Discussed patient's daughter is staying with patient to assist with care and transportation Instructed patient to contact her doctor to report new symptoms or concerns Reviewed scheduled/upcoming provider appointments including PCP post discharge follow up scheduled for 08/05/22 (virtual visit), and 08/09/22 (face to face visit) with Carl Best NP    Our next appointment is by telephone on 09/03/22 at 09:30 AM   Please call the care guide team at [phone] if you need to cancel or reschedule your appointment.   If you are experiencing a Mental Health or Carrier or need someone to talk to, please call [phone]-TALK (toll free, 24 hour hotline)  Patient verbalizes understanding of instructions and care plan provided today and agrees to view in [place]. Active MyChart status and patient understanding of how to access instructions and care plan via MyChart confirmed with patient.     Barb Merino, RN, BSN, CCM Care Management Coordinator [facility] Care Management  Direct Phone: [phone]

## 2022-08-09 ENCOUNTER — Other Ambulatory Visit (INDEPENDENT_AMBULATORY_CARE_PROVIDER_SITE_OTHER): Payer: Medicare Other

## 2022-08-09 ENCOUNTER — Encounter: Payer: Self-pay | Admitting: Nurse Practitioner

## 2022-08-09 ENCOUNTER — Ambulatory Visit (INDEPENDENT_AMBULATORY_CARE_PROVIDER_SITE_OTHER): Payer: Medicare Other | Admitting: Nurse Practitioner

## 2022-08-09 VITALS — BP 124/74 | HR 75 | Ht 66.0 in | Wt 143.0 lb

## 2022-08-09 DIAGNOSIS — Z8619 Personal history of other infectious and parasitic diseases: Secondary | ICD-10-CM

## 2022-08-09 DIAGNOSIS — K5792 Diverticulitis of intestine, part unspecified, without perforation or abscess without bleeding: Secondary | ICD-10-CM

## 2022-08-09 DIAGNOSIS — E1165 Type 2 diabetes mellitus with hyperglycemia: Secondary | ICD-10-CM | POA: Diagnosis not present

## 2022-08-09 DIAGNOSIS — F411 Generalized anxiety disorder: Secondary | ICD-10-CM | POA: Diagnosis not present

## 2022-08-09 DIAGNOSIS — G4700 Insomnia, unspecified: Secondary | ICD-10-CM | POA: Diagnosis not present

## 2022-08-09 DIAGNOSIS — R634 Abnormal weight loss: Secondary | ICD-10-CM | POA: Diagnosis not present

## 2022-08-09 DIAGNOSIS — F331 Major depressive disorder, recurrent, moderate: Secondary | ICD-10-CM | POA: Diagnosis not present

## 2022-08-09 DIAGNOSIS — I10 Essential (primary) hypertension: Secondary | ICD-10-CM | POA: Diagnosis not present

## 2022-08-09 DIAGNOSIS — E039 Hypothyroidism, unspecified: Secondary | ICD-10-CM | POA: Diagnosis not present

## 2022-08-09 LAB — COMPREHENSIVE METABOLIC PANEL
ALT: 12 U/L (ref 0–35)
AST: 15 U/L (ref 0–37)
Albumin: 3.3 g/dL — ABNORMAL LOW (ref 3.5–5.2)
Alkaline Phosphatase: 61 U/L (ref 39–117)
BUN: 12 mg/dL (ref 6–23)
CO2: 29 mEq/L (ref 19–32)
Calcium: 9.2 mg/dL (ref 8.4–10.5)
Chloride: 104 mEq/L (ref 96–112)
Creatinine, Ser: 1.91 mg/dL — ABNORMAL HIGH (ref 0.40–1.20)
GFR: 23.93 mL/min — ABNORMAL LOW (ref >60.00)
Glucose, Bld: 127 mg/dL — ABNORMAL HIGH (ref 70–99)
Potassium: 3.9 mEq/L (ref 3.5–5.1)
Sodium: 140 mEq/L (ref 135–145)
Total Bilirubin: 0.3 mg/dL (ref 0.2–1.2)
Total Protein: 6.3 g/dL (ref 6.0–8.3)

## 2022-08-09 LAB — CBC WITH DIFFERENTIAL/PLATELET
Basophils Absolute: 0.1 10*3/uL (ref 0.0–0.1)
Basophils Relative: 1.9 % (ref 0.0–3.0)
Eosinophils Absolute: 0.2 10*3/uL (ref 0.0–0.7)
Eosinophils Relative: 3.7 % (ref 0.0–5.0)
HCT: 31 % — ABNORMAL LOW (ref 36.0–46.0)
Hemoglobin: 10.3 g/dL — ABNORMAL LOW (ref 12.0–15.0)
Lymphocytes Relative: 35.4 % (ref 12.0–46.0)
Lymphs Abs: 1.7 10*3/uL (ref 0.7–4.0)
MCHC: 33.2 g/dL (ref 30.0–36.0)
MCV: 88.9 fl (ref 78.0–100.0)
Monocytes Absolute: 0.3 10*3/uL (ref 0.1–1.0)
Monocytes Relative: 6.9 % (ref 3.0–12.0)
Neutro Abs: 2.5 10*3/uL (ref 1.4–7.7)
Neutrophils Relative %: 52.1 % (ref 43.0–77.0)
Platelets: 350 10*3/uL (ref 150.0–400.0)
RBC: 3.49 Mil/uL — ABNORMAL LOW (ref 3.87–5.11)
RDW: 16.3 % — ABNORMAL HIGH (ref 11.5–15.5)
WBC: 4.8 10*3/uL (ref 4.0–10.5)

## 2022-08-09 MED ORDER — VANCOMYCIN HCL 125 MG PO CAPS
ORAL_CAPSULE | ORAL | 0 refills | Status: AC
  Filled 2022-08-12: qty 42, 21d supply, fill #0

## 2022-08-09 NOTE — Progress Notes (Unsigned)
08/09/2022 Diana Harper [id number] 1936-09-03   Chief Complaint: Diverticulitis, C. difficile  History of Present Illness: Diana Harper is an 86 year old female with a past medical history of anxiety, hypertension, hyperlipidemia, hypothyroidism, CVA, diabetes mellitus type 2, CKD stage III, glaucoma, diverticulitis, C. difficile and colon polyps.  Past cholecystectomy and hysterectomy.  She was initially seen by Dr. Rush Landmark in office 02/04/2021 due to having abdominal pain, abdominal bloat and intermittent bright red rectal bleeding.  An EGD and colonoscopy were done  She has been admitted to the hospital with diverticulitis and [name].    04/06/2022 - 04/09/2022 acute diverticulitis   8/17 - 8/19   8/26 - 8/30  [name] 8/26    She was admitted to the hospital with acute sigmoid diverticulitis with concurrent [name]  colitis. Discharged home on Cipro Flagyl and Vanco.  Still has 2 days of vancomycin   Her lowe abdominal pain has improved, no longer to back, now just across the stomach.   Eating triggers pain  She continues to have diarrhe 3 to 4 times daily, stands up or moves then stool comes running out. No blood.   Prior to hospital she had nonstop diarrhea   [name]   Breakfast: soft diet. No appetite  Metaic taste  No nausea or vomiting  Water one glass daily   Chicken, mac and cheese  Labs with PCP order by PCP next week, virtual       Latest Ref Rng & Units 07/27/2022    6:22 AM 07/26/2022    4:49 AM 07/25/2022    5:57 AM  CBC  WBC 4.0 - 10.5 K/uL 13.9  15.1  12.7   Hemoglobin 12.0 - 15.0 g/dL 9.0  10.2  9.5   Hematocrit 36.0 - 46.0 % 27.5  31.9  28.6   Platelets 150 - 400 K/uL 237  238  193        Latest Ref Rng & Units 07/27/2022    6:22 AM 07/26/2022    4:49 AM 07/25/2022    5:57 AM  CMP  Glucose 70 - 99 mg/dL 123  167  138   BUN 8 - 23 mg/dL 33  28  27   Creatinine 0.44 - 1.00 mg/dL 1.56  1.64  1.59   Sodium 135 - 145 mmol/L 138   138  139   Potassium 3.5 - 5.1 mmol/L 3.3  3.5  3.3   Chloride 98 - 111 mmol/L 110  109  110   CO2 22 - 32 mmol/L _0 Calcium 8.9 - 10.3 mg/dL 8.4  8.4  8.4   Total Protein 6.5 - 8.1 g/dL   5.9   Total Bilirubin 0.3 - 1.2 mg/dL   0.6   Alkaline Phos 38 - 126 U/L   96   AST 15 - 41 U/L   23   ALT 0 - 44 U/L   28      Current Medications, Allergies, Past Medical History, Past Surgical History, Family History and Social History were reviewed in [employer] record.   Review of Systems:   Constitutional: + Weight loss.  Respiratory: Negative for shortness of breath.   Cardiovascular: Negative for chest pain, palpitations and leg swelling.  Gastrointestinal: See HPI.  Musculoskeletal: Negative for back pain or muscle aches.  Neurological: Negative for dizziness, headaches or paresthesias.    Physical Exam: There were no vitals taken for this visit. BP 124/74  Pulse 75   Ht _0  (1.676 m)   Wt 143 lb (64.9 kg)   BMI 23.08 kg/m   162 down to 143 today.  General: 86 year old female in NAD.  Head: Normocephalic and atraumatic. Eyes: No scleral icterus. Conjunctiva pink . Ears: Normal auditory acuity. Mouth: Dentition intact. No ulcers or lesions.  Lungs: Clear throughout to auscultation. Heart: Regular rate and rhythm, no murmur. Abdomen: Soft, nontender. Mild lower abdominal tenderness without rebound or guarding. No masses or hepatomegaly. Normal bowel sounds x 4 quadrants.  Rectal: Deferred.  Musculoskeletal: Symmetrical with no gross deformities. Extremities: No edema. Neurological: Alert oriented x 4. No focal deficits.  Psychological: Alert and cooperative. Normal mood and affect  Assessment and Recommendations:  76) 86 year old female with diverticulitis   2). [name] colitis  -Finish Vancomycin 186m po qid x 14 days then Vancomycin 1239mone po tid x 7 days then 12554mne po bid x 7 days then 125m23me po once daily x 7  days

## 2022-08-09 NOTE — Patient Instructions (Signed)
If you are age 84 or older, your body mass index should be between 23-30. Your Body mass index is 23.08 kg/m. If this is out of the aforementioned range listed, please consider follow up with your Primary Care Provider.  If you are age 29 or younger, your body mass index should be between 19-25. Your Body mass index is 23.08 kg/m. If this is out of the aformentioned range listed, please consider follow up with your Primary Care Provider.   ________________________________________________________  The [facility] providers would like to encourage you to use [facility] to communicate with providers for non-urgent requests or questions.  Due to long hold times on the telephone, sending your provider a message by [facility] may be a faster and more efficient way to get a response.  Please allow 48 business hours for a response.  Please remember that this is for non-urgent requests.  _______________________________________________________   We have sent the following medications to your pharmacy for you to pick up at your convenience: Vancomycin taper 125 mg (3 times daily for 7 days then 2 times daily for 7 days then once daily for 7 days.)  Start Florastor probiotic by mouth 2 times a day for 4 weeks.  Drink 6 glasses of water daily & increase protein in diet.   Your provider has requested that you go to the basement level for lab work before leaving today. Press "B" on the elevator. The lab is located at the first door on the left as you exit the elevator.   Due to recent changes in healthcare laws, you may see the results of your imaging and laboratory studies on MyChart before your provider has had a chance to review them.  We understand that in some cases there may be results that are confusing or concerning to you. Not all laboratory results come back in the same time frame and the provider may be waiting for multiple results in order to interpret others.  Please give [name] 48 hours in order for your  provider to thoroughly review all the results before contacting the office for clarification of your results.    It was a pleasure to see you today!  Thank you for trusting me with your gastrointestinal care!

## 2022-08-10 ENCOUNTER — Ambulatory Visit: Payer: Self-pay

## 2022-08-10 ENCOUNTER — Encounter: Payer: Self-pay | Admitting: Nurse Practitioner

## 2022-08-10 DIAGNOSIS — H9193 Unspecified hearing loss, bilateral: Secondary | ICD-10-CM | POA: Diagnosis not present

## 2022-08-10 DIAGNOSIS — M19012 Primary osteoarthritis, left shoulder: Secondary | ICD-10-CM | POA: Diagnosis not present

## 2022-08-10 DIAGNOSIS — E1140 Type 2 diabetes mellitus with diabetic neuropathy, unspecified: Secondary | ICD-10-CM | POA: Diagnosis not present

## 2022-08-10 DIAGNOSIS — I69354 Hemiplegia and hemiparesis following cerebral infarction affecting left non-dominant side: Secondary | ICD-10-CM | POA: Diagnosis not present

## 2022-08-10 DIAGNOSIS — I10 Essential (primary) hypertension: Secondary | ICD-10-CM | POA: Diagnosis not present

## 2022-08-10 DIAGNOSIS — R32 Unspecified urinary incontinence: Secondary | ICD-10-CM | POA: Diagnosis not present

## 2022-08-10 NOTE — Progress Notes (Signed)
Attending Physician's Attestation   I have reviewed the chart.   I agree with the Advanced Practitioner's note, impression, and recommendations with any updates as below. Agree with tapered vancomycin course if able to afford to try to help patient get through what may be residual/persistent infection.  Ideal to try to minimize risk of recurrent antibiotic use in future if possible but seems that if she cannot afford vancomycin we may be in a difficult situation if we cannot get Dificid approved either.  Time will tell.  Agree with probiotics.  Would minimize PPI use if able and so agree with H2 RA use right now.   Justice Britain, MD [facility] Office # [phone]

## 2022-08-10 NOTE — Patient Instructions (Signed)
Visit Information  Thank you for taking time to visit with me today. Please don't hesitate to contact me if I can be of assistance to you.   Following are the goals we discussed today:   Goals Addressed             This Visit's Progress    To increase her eating and gain back some weight       Care Coordination Interventions: Evaluation of current treatment plan related to abnormal weight loss with decreased caloric intake and patient's adherence to plan as established by provider Advised patient to continue to monitor patient's weight at home and to report persistent weight loss to PCP promptly Provided education to patient re: the importance to increase protein intake, eat good meals per day as directed by PCP adding protein bars in between meals Reinforced the importance to stay well hydrated, aiming for 48 oz-64 oz daily water intake unless otherwise directed         Our next appointment is by telephone on 09/03/22 at 09:30 AM  Please call the care guide team at [phone] if you need to cancel or reschedule your appointment.   If you are experiencing a Mental Health or [place] or need someone to talk to, please call 1-800-273-TALK (toll free, 24 hour hotline)  Patient verbalizes understanding of instructions and care plan provided today and agrees to view in [place]. Active MyChart status and patient understanding of how to access instructions and care plan via MyChart confirmed with patient.     Barb Merino, RN, BSN, CCM Care Management Coordinator [facility] Care Management Direct Phone: [phone]

## 2022-08-10 NOTE — Patient Outreach (Signed)
  Care Coordination   Follow Up Visit Note   08/10/2022 Name: Natilee Gauer MRN: [id number] DOB: 1936/04/24  Luree Palla is a 86 y.o. year old female who sees [name], MD for primary care. I spoke with Willeen Niece by phone today.  What matters to the patients health and wellness today?  Patient and daughter are concerned about patient's weight loss due to low caloric intake following an episode of C-diff and Diverticulosis.     Goals Addressed             This Visit's Progress    To increase her eating and gain back some weight       Care Coordination Interventions: Evaluation of current treatment plan related to abnormal weight loss with decreased caloric intake and patient's adherence to plan as established by provider Advised patient to continue to monitor patient's weight at home and to report persistent weight loss to PCP promptly Provided education to patient re: the importance to increase protein intake, eat good meals per day as directed by PCP adding protein bars in between meals Reinforced the importance to stay well hydrated, aiming for 48 oz-64 oz daily water intake unless otherwise directed         SDOH assessments and interventions completed:  No    Care Coordination Interventions Activated:  Yes  Care Coordination Interventions:  Yes, provided   Follow up plan: Follow up call scheduled for 09/03/22 '@09'$ :30 AM    Encounter Outcome:  Pt. Visit Completed

## 2022-08-11 ENCOUNTER — Telehealth: Payer: Self-pay

## 2022-08-11 ENCOUNTER — Telehealth: Payer: Self-pay | Admitting: *Deleted

## 2022-08-11 ENCOUNTER — Ambulatory Visit: Payer: Self-pay

## 2022-08-11 NOTE — Patient Outreach (Signed)
  Care Coordination   Follow Up Visit Note   08/11/2022 Name: Diana Harper MRN: [id number] DOB: 1936-05-02  Diana Harper is a 86 y.o. year old female who sees Diana Bien, MD for primary care. I  spoke with patients daughter and caregiver by phone today.  What matters to the patients health and wellness today?  To continue gaining weight since recent inpatient stay    Goals Addressed               This Visit's Progress     Patient Stated     COMPLETED: Complete Advance Directives (pt-stated)        Care Coordination Interventions: Determined patient has yet to receive mailed advance directive packet Discussed plan for SW to place a new packet in the mail today; requested Diana Harper let RN Care Manager if packet is not received during future call Encouraged patients daughter Diana Harper to contact SW as needed        SDOH assessments and interventions completed:  No     Care Coordination Interventions Activated:  Yes  Care Coordination Interventions:  Yes, provided   Follow up plan: No further intervention required.   Encounter Outcome:  Pt. Visit Completed   Diana Harper, BSW, CDP Social Worker, Certified Dementia Practitioner Care Coordination [phone]

## 2022-08-11 NOTE — Patient Instructions (Signed)
Visit Information  Thank you for taking time to visit with me today. Please don't hesitate to contact me if I can be of assistance to you.   Following are the goals we discussed today:   Goals Addressed               This Visit's Progress     Patient Stated     COMPLETED: Complete Advance Directives (pt-stated)        Care Coordination Interventions: Determined patient has yet to receive mailed advance directive packet Discussed plan for SW to place a new packet in the mail today; requested [name] let RN Care Manager if packet is not received during future call Encouraged patients daughter Babs Sciara to contact SW as needed        Please call the care guide team at [phone] if you need to schedule an appointment with me  If you are experiencing a Mental Health or [place] or need someone to talk to, please call 1-800-273-TALK (toll free, 24 hour hotline)  Patient verbalizes understanding of instructions and care plan provided today and agrees to view in [place]. Active MyChart status and patient understanding of how to access instructions and care plan via MyChart confirmed with patient.     The care management team will reach out to the patient again over the next 45 days.   Daneen Schick, BSW, CDP Social Worker, Certified Dementia Practitioner Care Coordination [phone]

## 2022-08-11 NOTE — Chronic Care Management (AMB) (Signed)
  Care Coordination   Note   08/11/2022 Name: Diana Harper  MRN: [id number] DOB: [birth year]  Diana Harper is a 86 y.o. year old female who sees [name], MD for primary care. I reached out to [name] by phone today to reschedule follow up with [facility] for care coordination services.    Follow up plan:  Unsuccessful telephone outreach attempt made. A HIPAA compliant phone message was left for the patient providing contact information and requesting a return call.  Encounter Outcome:  No Answer  [place]  Direct Dial: [phone]

## 2022-08-11 NOTE — Patient Outreach (Signed)
  Care Coordination   08/11/2022 Name: Diana Harper MRN: [id number] DOB: Sep 20, 1936   Care Coordination Outreach Attempts:  An unsuccessful telephone outreach was attempted today to offer the patient information about available care coordination services as a benefit of their health plan.   Follow Up Plan:  Additional outreach attempts will be made to offer the patient care coordination information and services.   Encounter Outcome:  No Answer  Care Coordination Interventions Activated:  No   Care Coordination Interventions:  No, not indicated    Daneen Schick, BSW, CDP Social Worker, Certified Dementia Practitioner Care Coordination [phone]

## 2022-08-12 ENCOUNTER — Ambulatory Visit: Payer: Self-pay

## 2022-08-12 ENCOUNTER — Telehealth: Payer: Self-pay

## 2022-08-12 ENCOUNTER — Telehealth: Payer: Self-pay | Admitting: Pharmacist

## 2022-08-12 ENCOUNTER — Other Ambulatory Visit (HOSPITAL_COMMUNITY): Payer: Self-pay

## 2022-08-12 NOTE — Patient Outreach (Addendum)
  Care Coordination   Follow Up Visit Note   08/12/2022 Name: Aleiyah Halpin MRN: [id number] DOB: June 16, 1936  Ellisa Devivo is a 86 y.o. year old female who sees [name], MD for primary care. I spoke with daughter [name] by phone today.  What matters to the patients health and wellness today?  Patient needs assistance with cost of Vancomycin.     Goals Addressed               This Visit's Progress     Patient Stated     I hope to make a full recovery from the infection I am being treated for (pt-stated)        Care Coordination Interventions: Received inbound call from daughter [name] concerning patient's inability to afford to renew her prescription for Vancomycin Determined patient completed a follow up visit with [facility] on 08/11/22 at which time her doctor recommended patient continue taking Vancomycin Discussed daughter [name] contacted patient's pharmacy and was told this medication will cost $1400 out of pocket  Determined daughter [name] contacted patient's health plan to learn Medicare will not cover this drug until November Educated daughter [name] re: [place] team, sent referral to the [place] team marked urgent priority to offer assistance           SDOH assessments and interventions completed:  No     Care Coordination Interventions Activated:  Yes  Care Coordination Interventions:  Yes, provided   Follow up plan: Referral made to [place] to assist with cost of Vancomycin Follow up call scheduled for 08/19/22 '@11'$ :15 AM    Encounter Outcome:  Pt. Visit Completed

## 2022-08-12 NOTE — Patient Instructions (Addendum)
Visit Information  Thank you for taking time to visit with me today. Please don't hesitate to contact me if I can be of assistance to you.   Following are the goals we discussed today:   Goals Addressed               This Visit's Progress     Patient Stated     I hope to make a full recovery from the infection I am being treated for (pt-stated)        Care Coordination Interventions: Received inbound call from daughter [name] concerning patient's inability to afford to renew her prescription for Vancomycin Determined patient completed a follow up visit with  GI on 08/11/22 at which time her doctor recommended patient continue taking Vancomycin Discussed daughter [name] contacted patient's pharmacy and was told this medication will cost $1400 out of pocket  Determined daughter [name] contacted patient's health plan to learn Medicare will not cover this drug until November Educated daughter [name] re: [place] team, sent referral to the [place] team marked urgent priority to offer assistance           Our next appointment is by telephone on 08/19/22 at 11:15 AM   Please call the care guide team at [phone] if you need to cancel or reschedule your appointment.   If you are experiencing a Mental Health or [place] or need someone to talk to, please call 1-800-273-TALK (toll free, 24 hour hotline)  Patient verbalizes understanding of instructions and care plan provided today and agrees to view in [place]. Active MyChart status and patient understanding of how to access instructions and care plan via MyChart confirmed with patient.     Barb Merino, RN, BSN, CCM Care Management Coordinator [facility] Care Management  Direct Phone: [phone]

## 2022-08-12 NOTE — Telephone Encounter (Signed)
-----   Message from Noralyn Pick, NP sent at 08/12/2022  7:47 AM EDT -----    ----- Message ----- From: Irving Copas., MD Sent: 08/10/2022  10:14 AM EDT To: Noralyn Pick, NP     ----- Message ----- From: Noralyn Pick, NP Sent: 08/10/2022   8:53 AM EDT To: Irving Copas., MD  Dr. Rush Landmark, I extended her Vanco course with a taper, diarrhea better but not resolved well enough to stop but let me know if you advise otherwise. Not sure she will be able to afford more Vano, daughter will let me know. She has lost a lot of weight, hopefully the longer she is off Cipro/flagyl her appetite might improve but still very concerning for failure to thrive.

## 2022-08-12 NOTE — Progress Notes (Signed)
Remo Lipps, pls contact patient or her daughter to verify if she was able to obtain the extended vanco RX. Let me know. Pls have her start Famotidine '40mg'$  once daily since her PPI was discontinued. THX.

## 2022-08-12 NOTE — Progress Notes (Signed)
[facility])  [place] Team   Reason for referral: Patient's daughter contacted [name] concerning the cost of Vancomycin  [facility] pharmacy referral is being closed due to the following reasons:  -Patient was outreached on today and we were able to have her prescription transferred to [facility] at a much more reasonable price (~$90 vs ~$1400 [facility]).   [facility] is happy to assist the patient/family in the future for clinical pharmacy needs, following a discussion from your team about [place] outreach. Thank you for allowing [facility] to be a part of your patient's care.   Curlene Labrum, PharmD [place] Pharmacist Office: [phone]

## 2022-08-12 NOTE — Telephone Encounter (Signed)
Spoke to pt daughter Afua Hoots : Pt daughter stated that [place] stated that they will not cover the medication until November and the Pharmacist states that the medication is $1,400 in which they cant afford:  [name] notified also have her start Famotidine '40mg'$  once daily:  Please advise

## 2022-08-16 NOTE — Telephone Encounter (Signed)
Spoke to pt daughter Diana Harper: Documented under result notes: Diana Harper  verbalized understanding with all questions answered.

## 2022-08-16 NOTE — Telephone Encounter (Signed)
Pt is returning your call

## 2022-08-17 NOTE — Chronic Care Management (AMB) (Signed)
  Care Coordination   Note   08/17/2022 Name: Diana Harper MRN: [id number] DOB: February 16, 1936  [name] is a 86 y.o. year old female who sees [name], MD for primary care. I reached out to [name] by phone today to reschedule a follow up call with [facility]  care coordination services.   Follow up plan:  Unsuccessful telephone outreach attempt made. A HIPAA compliant phone message was left for the patient providing contact information and requesting a return call.  Encounter Outcome:  No Answer  [name]  Direct Dial: [phone]

## 2022-08-18 DIAGNOSIS — R634 Abnormal weight loss: Secondary | ICD-10-CM | POA: Diagnosis not present

## 2022-08-18 DIAGNOSIS — I639 Cerebral infarction, unspecified: Secondary | ICD-10-CM | POA: Diagnosis not present

## 2022-08-18 DIAGNOSIS — I10 Essential (primary) hypertension: Secondary | ICD-10-CM | POA: Diagnosis not present

## 2022-08-18 DIAGNOSIS — E1165 Type 2 diabetes mellitus with hyperglycemia: Secondary | ICD-10-CM | POA: Diagnosis not present

## 2022-08-18 DIAGNOSIS — K5792 Diverticulitis of intestine, part unspecified, without perforation or abscess without bleeding: Secondary | ICD-10-CM | POA: Diagnosis not present

## 2022-08-19 ENCOUNTER — Ambulatory Visit: Payer: Self-pay

## 2022-08-19 NOTE — Patient Instructions (Signed)
Visit Information  Thank you for taking time to visit with me today. Please don't hesitate to contact me if I can be of assistance to you.   Following are the goals we discussed today:   Goals Addressed               This Visit's Progress     Patient Stated     I hope to make a full recovery from the infection I am being treated for (pt-stated)        Care Coordination Interventions: Evaluation of current treatment plan related to treatment for C. difficile and patient's adherence to plan as established by provider Reviewed medications with daughter [name] and discussed the [place] team was able to help get patient's Vancomycin for $100 through the [place] patient was able to resume taking this medication without interruption of her treatment and her symptoms are resolving Discussed patient is feeling much better and her abdominal pain has subsided as well as the associated symptoms related to C-diff Discussed and reviewed ongoing upcoming MD appointments with PCP and [facility]        Our next appointment is by telephone on 09/24/22 at 10:30 AM   Please call the care guide team at [phone] if you need to cancel or reschedule your appointment.   If you are experiencing a Mental Health or [place] or need someone to talk to, please call 1-800-273-TALK (toll free, 24 hour hotline)  Patient verbalizes understanding of instructions and care plan provided today and agrees to view in [place]. Active MyChart status and patient understanding of how to access instructions and care plan via MyChart confirmed with patient.     Barb Merino, RN, BSN, CCM Care Management Coordinator [place] Management Direct Phone: [phone]

## 2022-08-19 NOTE — Patient Outreach (Signed)
  Care Coordination   Follow Up Visit Note   08/19/2022 Name: Diana Harper MRN: [id number] DOB: 02-Dec-1935  Diana Harper is a 86 y.o. year old female who sees Diana Bien, MD for primary care. I  spoke with daughter Diana Harper by phone today.  What matters to the patients health and wellness today?  Daughter would like for her mother to complete her Vancomycin and eat better.     Goals Addressed               This Visit's Progress     Patient Stated     I hope to make a full recovery from the infection I am being treated for (pt-stated)        Care Coordination Interventions: Evaluation of current treatment plan related to treatment for C. difficile and patient's adherence to plan as established by provider Reviewed medications with daughter Diana Harper and discussed the [place] team was able to help get patient's Vancomycin for $100 through the [place] patient was able to resume taking this medication without interruption of her treatment and her symptoms are resolving Discussed patient is feeling much better and her abdominal pain has subsided as well as the associated symptoms related to C-diff Discussed and reviewed ongoing upcoming MD appointments with PCP and [facility]           SDOH assessments and interventions completed:  No     Care Coordination Interventions Activated:  Yes  Care Coordination Interventions:  Yes, provided   Follow up plan: Follow up call scheduled for 09/24/22 '@10'$ :30 AM    Encounter Outcome:  Pt. Visit Completed

## 2022-08-20 ENCOUNTER — Telehealth: Payer: Self-pay | Admitting: Nurse Practitioner

## 2022-08-20 NOTE — Telephone Encounter (Signed)
Inbound call from Dr. Ival Bible office stating they need appt notes and path report. Called medical records but they weren't able to assist them. Please advise

## 2022-08-20 NOTE — Telephone Encounter (Signed)
Dr. Ernie Hew Office contacted: Fax # received [phone] Recent office visit note and Pathology report was Faxed to Dr. Ernie Hew office [phone]

## 2022-08-21 DIAGNOSIS — K5732 Diverticulitis of large intestine without perforation or abscess without bleeding: Secondary | ICD-10-CM | POA: Diagnosis not present

## 2022-08-21 DIAGNOSIS — E1140 Type 2 diabetes mellitus with diabetic neuropathy, unspecified: Secondary | ICD-10-CM | POA: Diagnosis not present

## 2022-08-21 DIAGNOSIS — E876 Hypokalemia: Secondary | ICD-10-CM | POA: Diagnosis not present

## 2022-08-21 DIAGNOSIS — K760 Fatty (change of) liver, not elsewhere classified: Secondary | ICD-10-CM | POA: Diagnosis not present

## 2022-08-21 DIAGNOSIS — E1122 Type 2 diabetes mellitus with diabetic chronic kidney disease: Secondary | ICD-10-CM | POA: Diagnosis not present

## 2022-08-21 DIAGNOSIS — N1832 Chronic kidney disease, stage 3b: Secondary | ICD-10-CM | POA: Diagnosis not present

## 2022-08-21 DIAGNOSIS — M19012 Primary osteoarthritis, left shoulder: Secondary | ICD-10-CM | POA: Diagnosis not present

## 2022-08-21 DIAGNOSIS — A0472 Enterocolitis due to Clostridium difficile, not specified as recurrent: Secondary | ICD-10-CM | POA: Diagnosis not present

## 2022-08-21 DIAGNOSIS — I69354 Hemiplegia and hemiparesis following cerebral infarction affecting left non-dominant side: Secondary | ICD-10-CM | POA: Diagnosis not present

## 2022-08-21 DIAGNOSIS — I129 Hypertensive chronic kidney disease with stage 1 through stage 4 chronic kidney disease, or unspecified chronic kidney disease: Secondary | ICD-10-CM | POA: Diagnosis not present

## 2022-08-21 DIAGNOSIS — J449 Chronic obstructive pulmonary disease, unspecified: Secondary | ICD-10-CM | POA: Diagnosis not present

## 2022-08-21 DIAGNOSIS — E039 Hypothyroidism, unspecified: Secondary | ICD-10-CM | POA: Diagnosis not present

## 2022-08-23 DIAGNOSIS — I69354 Hemiplegia and hemiparesis following cerebral infarction affecting left non-dominant side: Secondary | ICD-10-CM | POA: Diagnosis not present

## 2022-08-23 DIAGNOSIS — E1140 Type 2 diabetes mellitus with diabetic neuropathy, unspecified: Secondary | ICD-10-CM | POA: Diagnosis not present

## 2022-08-23 DIAGNOSIS — I10 Essential (primary) hypertension: Secondary | ICD-10-CM | POA: Diagnosis not present

## 2022-08-23 DIAGNOSIS — R32 Unspecified urinary incontinence: Secondary | ICD-10-CM | POA: Diagnosis not present

## 2022-08-23 DIAGNOSIS — M19012 Primary osteoarthritis, left shoulder: Secondary | ICD-10-CM | POA: Diagnosis not present

## 2022-08-23 DIAGNOSIS — H9193 Unspecified hearing loss, bilateral: Secondary | ICD-10-CM | POA: Diagnosis not present

## 2022-08-23 NOTE — Chronic Care Management (AMB) (Signed)
RNCM fu 08/19/22  [place]  Direct Dial: [phone]

## 2022-08-23 NOTE — Progress Notes (Unsigned)
Initial neurology clinic note  SERVICE DATE: 08/25/22  Reason for Evaluation: Consultation requested by Diana Bien, MD for an opinion regarding falls. My final recommendations will be communicated back to the requesting physician by way of shared medical record or letter to requesting physician via [name] mail.  HPI: This is [name], a 86 y.o. right-handed female with a medical history of DM, stroke (2-3 years ago), diverticulitis, HTN, HLD, hypothyroidism, vit D deficiency who presents to neurology clinic with the chief complaint of imbalance, falls, and weakness. The patient is accompanied by daughter.  Patient had an episode of not being able to move her left side and not being able to talk 2-3 years ago. She also had numbness on her left. It lasted about 5-10 minutes until she sat down and caught her breath. While things got better, she has had residual left sided symptoms since then. Lifting her left arm or leg is more difficulty than the right. She also has weakness on the right, but just more on the left.   Patient has noticed increased balance difficulty over the last 2 weeks. Per daughter, she was on a decline, but there was a big decline over the last couple of weeks. Patient seems like she is very delayed. She has delayed speech and movement. Per daughter, she has a lot of anxiety about falling. 3 weeks ago she was walking with a cane. Then 2 weeks ago she started needing a walker. Then over the last week she cannot walk unassisted. She has had multiple falls and hit her head at least a couple of times. She has fallen 5 times in the last week and had previously fallen once per week in the two weeks prior.  Daughter mentions patient is not eating. She will think she already eaten even though she has not. She thinks someone refilled her plate. She is losing weight (10 lbs over 3 weeks). She is not drinking water well. She is sleeping a lot as well. She is not  cognitively.  She also has a new tremoring that was not present previously.  Of note, patient has been to the ED 3 times in August (admitted twice). Patient had C diff twice per patient. She is currently being treated. Patient endorses bowel and bladder incontinence over the last 3 weeks. Patient does not always take her medications.   Patient gets PT and OT at home. They have also noticed a step decline.  Daughter reports that patient has an MRI on Saturday of her brain.   MEDICATIONS:  Outpatient Encounter Medications as of 08/25/2022  Medication Sig   acetaminophen (TYLENOL) 500 MG tablet Take 1,000 mg by mouth every 8 (eight) hours as needed for mild pain, headache or fever.   BAYER LOW DOSE 81 MG tablet Take 81 mg by mouth daily.   buPROPion (WELLBUTRIN XL) 150 MG 24 hr tablet Take 150 mg by mouth daily.   Calcium Carbonate (CALTRATE 600 PO) Take 600 mg by mouth daily.   Cholecalciferol (VITAMIN D3) 25 MCG (1000 UT) CAPS Take 1,000 Units by mouth daily.   cloNIDine (CATAPRES - DOSED IN MG/24 HR) 0.2 mg/24hr patch Place 0.2 mg onto the skin every Wednesday.   Continuous Blood Gluc Sensor (FREESTYLE LIBRE 14 DAY SENSOR) MISC Inject 1 Device into the skin every 14 (fourteen) days.   dicyclomine (BENTYL) 20 MG tablet Take 20 mg by mouth 2 (two) times daily as needed for spasms.   Glucagon HCl 1 MG SOLR  Inject 1 mg into the skin as needed (for a blood sugar <67).   insulin degludec (TRESIBA) 200 UNIT/ML FlexTouch Pen Inject 10 Units into the skin See admin instructions. Inject 10 units into the skin daily before brunch   levothyroxine (SYNTHROID) 88 MCG tablet Take 88 mcg by mouth daily before breakfast.   loratadine (CLARITIN) 10 MG tablet Take 10 mg by mouth daily.   NEXIUM 40 MG capsule Take 40 mg by mouth daily as needed (for reflux). (Patient not taking: Reported on 08/09/2022)   nitroGLYCERIN (NITROSTAT) 0.4 MG SL tablet Place 1 tablet (0.4 mg total) under the tongue every 5 (five)  minutes as needed for chest pain.   NOVOLOG FLEXPEN 100 UNIT/ML FlexPen Inject 5-10 Units into the skin See admin instructions. Inject 5 units into the skin before meals if BGL is 100-150, 7 units if BGL is 151-200, and 10 units if BGL is greater than 200   sertraline (ZOLOFT) 50 MG tablet Take 150 mg by mouth at bedtime.   tamsulosin (FLOMAX) 0.4 MG CAPS capsule Take 0.4 mg by mouth daily.   TRELEGY ELLIPTA 200-62.5-25 MCG/ACT AEPB Inhale 1 puff into the lungs daily as needed (for flares).   vancomycin (VANCOCIN) 125 MG capsule Take 1 capsule (125 mg total) by mouth 3 (three) times daily for 7 days, THEN 1 capsule (125 mg total) in the morning and at bedtime for 7 days, THEN 1 capsule (125 mg total) daily for 7 days.   No facility-administered encounter medications on file as of 08/25/2022.    PAST MEDICAL HISTORY: Past Medical History:  Diagnosis Date   Anemia    Anxiety    Arthritis    Asthma    Biliary colic    Cholelithiasis    CKD (chronic kidney disease), stage III [facility])    nephrologist--  dr Meredeth Ide   Colon polyps    Diverticulosis of colon    Gastric ulcer    Glaucoma    History of asthma    1980's  no longer problem since 1980's   History of diverticulitis of colon    recurrent--  2014;  2013;  2012   Hyperlipidemia    Hypertension    Hypothyroidism    Insulin dependent type 2 diabetes mellitus ([place])    followed by dr Jenny Reichmann lambeth ([facility])    Murmur    Peripheral neuropathy    Stroke [facility])    Vitamin D deficiency    Wears hearing aid    bilateral   Wears partial dentures    lower partial and upper full    PAST SURGICAL HISTORY: Past Surgical History:  Procedure Laterality Date   ABDOMINAL HYSTERECTOMY  1970's   CARDIOVASCULAR STRESS TEST  07/30/2009   normal exercise lexiscan nuclear study w/ no ischemia/  normal LV funciton and wall motion, ef 77%   CATARACT EXTRACTION W/ INTRAOCULAR LENS  IMPLANT, BILATERAL  2014   LAPAROSCOPIC CHOLECYSTECTOMY SINGLE  SITE WITH INTRAOPERATIVE CHOLANGIOGRAM N/A 08/25/2016   Procedure: LAPAROSCOPIC CHOLECYSTECTOMY WITH INTRAOPERATIVE CHOLANGIOGRAM;  Surgeon: Arta Bruce Kinsinger, MD;  Location: [place];  Service: General;  Laterality: N/A;   LUMBAR SPINE SURGERY  2014   REMOVAL AXILLA CYST Right 1990's    ALLERGIES: Allergies  Allergen Reactions   Latex Anaphylaxis, Shortness Of Breath and Other (See Comments)    Severe respiratory distress   Onion Diarrhea   Other Other (See Comments)    Patient cannot have TREE NUTS and anything with seeds- History of  diverticulitis    Flexeril [Cyclobenzaprine] Other (See Comments)    Caused agitation   Lisinopril Cough   Metformin And Related Diarrhea   Simvastatin Hives and Rash   Tetracycline Rash   Zithromax [Azithromycin] Rash    FAMILY HISTORY: Family History  Problem Relation Age of Onset   Stroke Mother    Colon polyps Mother    Diabetes Mother    Diabetes Father    Kidney failure Father    COPD Father    Colon polyps Sister    Diabetes Sister    Stroke Brother    Diabetes Brother    Heart attack Maternal Grandmother    Heart disease Maternal Grandfather    Diabetes Paternal Grandmother    Stroke Paternal Grandfather    Hypertension Paternal Grandfather    Diabetes Sister    Diabetes Sister    Diabetes Son    Diabetes Daughter    Colon cancer Neg Hx    Esophageal cancer Neg Hx    Inflammatory bowel disease Neg Hx    Liver disease Neg Hx    Pancreatic cancer Neg Hx    Rectal cancer Neg Hx    Stomach cancer Neg Hx     SOCIAL HISTORY: Social History   Tobacco Use   Smoking status: Never   Smokeless tobacco: Never  Vaping Use   Vaping Use: Never used  Substance Use Topics   Alcohol use: No   Drug use: No   Social History   Social History Narrative   Not on file     OBJECTIVE: PHYSICAL EXAM: Orthostatic BP 118/90 120/82 112/80  Patient Position Supine Sitting Standing  Orthostatic Pulse 86 81 95   SpO2 98 % 97 % 98 %   General: General appearance: Very drowsy. Falls asleep throughout history. No distress. Cooperative with exam.  Skin: No obvious rash or jaundice. HEENT: Atraumatic. Anicteric. Lungs: Non-labored breathing on room air  Heart: Regular Psych: Affect appropriate.  Neurological: Mental Status: Alert. Speech slow but fluent. Able to name objects and repeat. No pseudobulbar affect Cranial Nerves: CNII: No RAPD. Visual fields intact. CNIII, IV, VI: PERRL. No nystagmus. EOMI. CN V: Facial sensation intact bilaterally to fine touch. CN VII: Facial muscles symmetric and strong. No ptosis at rest. CN VIII: Hearing grossly intact bilaterally. CN IX: No hypophonia. CN X: Palate elevates symmetrically. CN XI: Full strength shoulder shrug bilaterally. CN XII: Tongue protrusion full and midline. No atrophy or fasciculations. No significant dysarthria Motor: Tone is normal. Tremors in bilateral upper extremities. ?asterixis Upper extremities antigravity without drift. Mild proximal weakness (4+/5) other wise 5/5 Lower extremities antigravity. Mild drift in left lower extremity. Left hip flexion 4/5, right hip flexion 4+/5, otherwise 5/5.   Reflexes:  Right Left   Bicep 2+ 2+   Tricep 2+ 2+   BrRad 2+ 2+   Knee 2+ 2+   Ankle Trace Trace    Sensation: Intact in bilateral upper extremities and right lower extremity. Diminished on left lower extremity to pin prick and vibration.  Coordination: Intact finger-to- nose-finger bilaterally. Tremor worse with motion.  Gait: Unable to assess as patient is too unsteady to safely ambulate  Lab and Test Review: Internal labs: Normal or unremarkable: TSH (02/04/21) CMP significant for elevated glucose (127), Cr (1.91), and low albumin (3.3) CBC significant for anemia (Hb 10.3) HbA1c (07/15/22): 7.1; 13.2 ~2 years ago  CT head wo contrast (06/02/22): FINDINGS: Brain: There is no mass, hemorrhage or extra-axial collection.  The size and  configuration of the ventricles and extra-axial CSF spaces are normal. There is hypoattenuation of the white matter, most commonly indicating chronic small vessel disease.   Vascular: No abnormal hyperdensity of the major intracranial arteries or dural venous sinuses. No intracranial atherosclerosis.   Skull: The visualized skull base, calvarium and extracranial soft tissues are normal.   Sinuses/Orbits: No fluid levels or advanced mucosal thickening of the visualized paranasal sinuses. No mastoid or middle ear effusion. The orbits are normal.   IMPRESSION: Chronic small vessel disease without acute intracranial abnormality.  MRI brain, MRA head wo contrast (10/10/2017): FINDINGS: MRI HEAD FINDINGS   Brain: The midline structures are normal. No focal diffusion restriction to indicate acute infarct. No intraparenchymal hemorrhage. There is beginning confluent hyperintense T2-weighted signal within the periventricular and deep white matter, most often seen in the setting of chronic microvascular ischemia. No mass lesion. No chronic microhemorrhage or cerebral amyloid angiopathy. No hydrocephalus, age advanced atrophy or lobar predominant volume loss. No dural abnormality or extra-axial collection.   Skull and upper cervical spine: The visualized skull base, calvarium, upper cervical spine and extracranial soft tissues are normal.   Sinuses/Orbits: No fluid levels or advanced mucosal thickening. No mastoid effusion. Normal orbits.   MRA HEAD FINDINGS   Intracranial internal carotid arteries: Normal.   Anterior cerebral arteries: Normal.   Middle cerebral arteries: Normal.   Posterior communicating arteries: Present bilaterally.   Posterior cerebral arteries: Normal.   Basilar artery: Normal.   Vertebral arteries: Left dominant. Normal.   Superior cerebellar arteries: Normal.   Anterior inferior cerebellar arteries: Not clearly visible.   Posterior  inferior cerebellar arteries: Normal.   IMPRESSION: 1. Chronic ischemic microangiopathy without acute intracranial abnormality. 2. No emergent large vessel occlusion or high-grade intracranial stenosis.  ASSESSMENT: Katherinne Mofield is a 86 y.o. female who presents for evaluation of falls, mental status change. She has a relevant medical history of DM, stroke (2-3 years ago), diverticulitis, HTN, HLD, hypothyroidism, vit D deficiency. Her neurological examination is pertinent for drowsiness, slow cognition, generalized weakness perhaps with LLE most weak, and decreased sensation in LLE.   The etiology of patient's symptoms is currently unclear. She has a clear rapid decline for about the last month. Over that period of time, patient has been hospitalized and treated for diverticulitis and C diff (per patient and daughter). Overall, her exam shows deficits that may be similar to prior stroke years ago when she had speech difficulties and left sided weakness and numbness. I think it is unlikely that this represents a new stroke. Given that symptoms are similar to prior stroke, combined with the mental status changes, I am more concerned about recrudescence of old stroke symptoms and delirium in the setting of illness (diverticulitis, C diff, uremia, nutrition concerns or dehydration given that patient is not eating or drinking).   I discussed my concerns with patient and her daughter. I recommended they go to the ED at [facility] to be evaluated with consideration of admission as patient may not be safe at home as she is not eating or drinking and is frequently falling. Patient and her daughter declined. They preferred to have scheduled MRI brain on Saturday before deciding next steps.  PLAN: -Blood work: B12, B1 -MRI brain wo contrast (may have already been ordered, daughter will let me know) -Advised to go to nearest ED with any new or worsening focal neurologic deficits -Continue asa 81 mg for  secondary stroke prevention  -Return to clinic in 1 month  The impression above as well as the plan as outlined below were extensively discussed with the patient (in the company of daughter) who voiced understanding. All questions were answered to their satisfaction.  The patient was counseled on pertinent fall precautions per the printed material provided today, and as noted under the "Patient Instructions" section below.  When available, results of the above investigations and possible further recommendations will be communicated to the patient via telephone/MyChart. Patient to call office if not contacted after expected testing turnaround time.   Total time spent reviewing records, interview, history/exam, documentation, and coordination of care on day of encounter:  85 min   Thank you for allowing me to participate in patient's care.  If I can answer any additional questions, I would be pleased to do so.  Kai Levins, MD   CC: Diana Bien, MD [address] [place] [address]  CC: Referring provider: [name], MD [address] [place],  [place] [id number]

## 2022-08-24 DIAGNOSIS — I129 Hypertensive chronic kidney disease with stage 1 through stage 4 chronic kidney disease, or unspecified chronic kidney disease: Secondary | ICD-10-CM | POA: Diagnosis not present

## 2022-08-24 DIAGNOSIS — E1140 Type 2 diabetes mellitus with diabetic neuropathy, unspecified: Secondary | ICD-10-CM | POA: Diagnosis not present

## 2022-08-24 DIAGNOSIS — H9193 Unspecified hearing loss, bilateral: Secondary | ICD-10-CM | POA: Diagnosis not present

## 2022-08-24 DIAGNOSIS — N1832 Chronic kidney disease, stage 3b: Secondary | ICD-10-CM | POA: Diagnosis not present

## 2022-08-24 DIAGNOSIS — E559 Vitamin D deficiency, unspecified: Secondary | ICD-10-CM | POA: Diagnosis not present

## 2022-08-24 DIAGNOSIS — F32A Depression, unspecified: Secondary | ICD-10-CM | POA: Diagnosis not present

## 2022-08-24 DIAGNOSIS — A0472 Enterocolitis due to Clostridium difficile, not specified as recurrent: Secondary | ICD-10-CM | POA: Diagnosis not present

## 2022-08-24 DIAGNOSIS — H409 Unspecified glaucoma: Secondary | ICD-10-CM | POA: Diagnosis not present

## 2022-08-24 DIAGNOSIS — K8020 Calculus of gallbladder without cholecystitis without obstruction: Secondary | ICD-10-CM | POA: Diagnosis not present

## 2022-08-24 DIAGNOSIS — E785 Hyperlipidemia, unspecified: Secondary | ICD-10-CM | POA: Diagnosis not present

## 2022-08-24 DIAGNOSIS — Z794 Long term (current) use of insulin: Secondary | ICD-10-CM | POA: Diagnosis not present

## 2022-08-24 DIAGNOSIS — Z9181 History of falling: Secondary | ICD-10-CM | POA: Diagnosis not present

## 2022-08-24 DIAGNOSIS — E1122 Type 2 diabetes mellitus with diabetic chronic kidney disease: Secondary | ICD-10-CM | POA: Diagnosis not present

## 2022-08-24 DIAGNOSIS — R32 Unspecified urinary incontinence: Secondary | ICD-10-CM | POA: Diagnosis not present

## 2022-08-24 DIAGNOSIS — Z7982 Long term (current) use of aspirin: Secondary | ICD-10-CM | POA: Diagnosis not present

## 2022-08-24 DIAGNOSIS — D649 Anemia, unspecified: Secondary | ICD-10-CM | POA: Diagnosis not present

## 2022-08-24 DIAGNOSIS — J449 Chronic obstructive pulmonary disease, unspecified: Secondary | ICD-10-CM | POA: Diagnosis not present

## 2022-08-24 DIAGNOSIS — E876 Hypokalemia: Secondary | ICD-10-CM | POA: Diagnosis not present

## 2022-08-24 DIAGNOSIS — K5732 Diverticulitis of large intestine without perforation or abscess without bleeding: Secondary | ICD-10-CM | POA: Diagnosis not present

## 2022-08-24 DIAGNOSIS — K760 Fatty (change of) liver, not elsewhere classified: Secondary | ICD-10-CM | POA: Diagnosis not present

## 2022-08-24 DIAGNOSIS — M19012 Primary osteoarthritis, left shoulder: Secondary | ICD-10-CM | POA: Diagnosis not present

## 2022-08-24 DIAGNOSIS — F419 Anxiety disorder, unspecified: Secondary | ICD-10-CM | POA: Diagnosis not present

## 2022-08-24 DIAGNOSIS — I69354 Hemiplegia and hemiparesis following cerebral infarction affecting left non-dominant side: Secondary | ICD-10-CM | POA: Diagnosis not present

## 2022-08-24 DIAGNOSIS — E039 Hypothyroidism, unspecified: Secondary | ICD-10-CM | POA: Diagnosis not present

## 2022-08-25 ENCOUNTER — Other Ambulatory Visit (INDEPENDENT_AMBULATORY_CARE_PROVIDER_SITE_OTHER): Payer: Medicare Other

## 2022-08-25 ENCOUNTER — Encounter: Payer: Self-pay | Admitting: Neurology

## 2022-08-25 ENCOUNTER — Ambulatory Visit (INDEPENDENT_AMBULATORY_CARE_PROVIDER_SITE_OTHER): Payer: Medicare Other | Admitting: Neurology

## 2022-08-25 DIAGNOSIS — Z8673 Personal history of transient ischemic attack (TIA), and cerebral infarction without residual deficits: Secondary | ICD-10-CM | POA: Diagnosis not present

## 2022-08-25 DIAGNOSIS — E1165 Type 2 diabetes mellitus with hyperglycemia: Secondary | ICD-10-CM | POA: Diagnosis not present

## 2022-08-25 DIAGNOSIS — R296 Repeated falls: Secondary | ICD-10-CM | POA: Diagnosis not present

## 2022-08-25 DIAGNOSIS — K5792 Diverticulitis of intestine, part unspecified, without perforation or abscess without bleeding: Secondary | ICD-10-CM | POA: Diagnosis not present

## 2022-08-25 DIAGNOSIS — R410 Disorientation, unspecified: Secondary | ICD-10-CM | POA: Diagnosis not present

## 2022-08-25 DIAGNOSIS — R29818 Other symptoms and signs involving the nervous system: Secondary | ICD-10-CM | POA: Diagnosis not present

## 2022-08-25 DIAGNOSIS — R269 Unspecified abnormalities of gait and mobility: Secondary | ICD-10-CM

## 2022-08-25 DIAGNOSIS — I10 Essential (primary) hypertension: Secondary | ICD-10-CM | POA: Diagnosis not present

## 2022-08-25 DIAGNOSIS — I639 Cerebral infarction, unspecified: Secondary | ICD-10-CM | POA: Diagnosis not present

## 2022-08-25 DIAGNOSIS — I69398 Other sequelae of cerebral infarction: Secondary | ICD-10-CM | POA: Diagnosis not present

## 2022-08-25 DIAGNOSIS — R634 Abnormal weight loss: Secondary | ICD-10-CM | POA: Diagnosis not present

## 2022-08-25 NOTE — Patient Instructions (Addendum)
You will have an MRI brain this Saturday. I do not think you have had a new stroke, but this will help [name] know for sure. I think the more likely explanation of your symptoms is that this is your old stroke symptoms coming out in the setting of diverticulitis, C diff infection, not eating well, and not drinking well. The mental status changes are likely also related to this. If this is the case, treating any infections or electrolyte abnormalities should help your symptoms.  We discussed you going to the emergency room to get a more urgent work up. I am worried you are not safe at home due to your mental status and being unable to keep up with your nutrition and hydration. After discussion, you declined but will monitor your symptoms closely.   I will look for your MRI brain and communicate my thoughts with Dr. Ernie Hew. I would like to get a couple of labs today to make sure certain vitamins aren't the problem.  I will be in touch when I have those results.  If you have new or worsening symptoms, I would not hesitate to be checked out in the closest ED.  The physicians and staff at [facility] are committed to providing excellent care. You may receive a survey requesting feedback about your experience at our office. We strive to receive "very good" responses to the survey questions. If you feel that your experience would prevent you from giving the office a "very good " response, please contact our office to try to remedy the situation. We may be reached at [phone]. Thank you for taking the time out of your busy day to complete the survey.  Kai Levins, MD [facility]  Preventing Falls at [facility] are common, often dreaded events in the lives of older people. Aside from the obvious injuries and even death that may result, fall can cause wide-ranging consequences including loss of independence, mental decline, decreased activity and mobility. Younger people are also at risk of  falling, especially those with chronic illnesses and fatigue.  Ways to reduce risk for falling Examine diet and medications. Warm foods and alcohol dilate blood vessels, which can lead to dizziness when standing. Sleep aids, antidepressants and pain medications can also increase the likelihood of a fall.  Get a vision exam. Poor vision, cataracts and glaucoma increase the chances of falling.  Check foot gear. Shoes should fit snugly and have a sturdy, nonskid sole and a broad, low heel  Participate in a physician-approved exercise program to build and maintain muscle strength and improve balance and coordination. Programs that use ankle weights or stretch bands are excellent for muscle-strengthening. Water aerobics programs and low-impact Tai Chi programs have also been shown to improve balance and coordination.  Increase vitamin D intake. Vitamin D improves muscle strength and increases the amount of calcium the body is able to absorb and deposit in bones.  How to prevent falls from common hazards Floors - Remove all loose wires, cords, and throw rugs. Minimize clutter. Make sure rugs are anchored and smooth. Keep furniture in its usual place.  Chairs -- Use chairs with straight backs, armrests and firm seats. Add firm cushions to existing pieces to add height.  Bathroom - Install grab bars and non-skid tape in the tub or shower. Use a bathtub transfer bench or a shower chair with a back support Use an elevated toilet seat and/or safety rails to assist standing from a low surface. Do not use towel  racks or bathroom tissue holders to help you stand.  Lighting - Make sure halls, stairways, and entrances are well-lit. Install a night light in your bathroom or hallway. Make sure there is a light switch at the top and bottom of the staircase. Turn lights on if you get up in the middle of the night. Make sure lamps or light switches are within reach of the bed if you have to get up during the  night.  Kitchen - Install non-skid rubber mats near the sink and stove. Clean spills immediately. Store frequently used utensils, pots, pans between waist and eye level. This helps prevent reaching and bending. Sit when getting things out of lower cupboards.  Living room/ Bedrooms - Place furniture with wide spaces in between, giving enough room to move around. Establish a route through the living room that gives you something to hold onto as you walk.  Stairs - Make sure treads, rails, and rugs are secure. Install a rail on both sides of the stairs. If stairs are a threat, it might be helpful to arrange most of your activities on the lower level to reduce the number of times you must climb the stairs.  Entrances and doorways - Install metal handles on the walls adjacent to the doorknobs of all doors to make it more secure as you travel through the doorway.  Tips for maintaining balance Keep at least one hand free at all times. Try using a backpack or fanny pack to hold things rather than carrying them in your hands. Never carry objects in both hands when walking as this interferes with keeping your balance.  Attempt to swing both arms from front to back while walking. This might require a conscious effort if Parkinson's disease has diminished your movement. It will, however, help you to maintain balance and posture, and reduce fatigue.  Consciously lift your feet off of the ground when walking. Shuffling and dragging of the feet is a common culprit in losing your balance.  When trying to navigate turns, use a "U" technique of facing forward and making a wide turn, rather than pivoting sharply.  Try to stand with your feet shoulder-length apart. When your feet are close together for any length of time, you increase your risk of losing your balance and falling.  Do one thing at a time. Don't try to walk and accomplish another task, such as reading or looking around. The decrease in your automatic  reflexes complicates motor function, so the less distraction, the better.  Do not wear rubber or gripping soled shoes, they might "catch" on the floor and cause tripping.  Move slowly when changing positions. Use deliberate, concentrated movements and, if needed, use a grab bar or walking aid. Count 15 seconds between each movement. For example, when rising from a seated position, wait 15 seconds after standing to begin walking.  If balance is a continuous problem, you might want to consider a walking aid such as a cane, walking stick, or walker. Once you've mastered walking with help, you might be ready to try it on your own again.

## 2022-08-26 ENCOUNTER — Telehealth: Payer: Self-pay | Admitting: Neurology

## 2022-08-26 DIAGNOSIS — K5732 Diverticulitis of large intestine without perforation or abscess without bleeding: Secondary | ICD-10-CM | POA: Diagnosis not present

## 2022-08-26 DIAGNOSIS — E1122 Type 2 diabetes mellitus with diabetic chronic kidney disease: Secondary | ICD-10-CM | POA: Diagnosis not present

## 2022-08-26 DIAGNOSIS — R634 Abnormal weight loss: Secondary | ICD-10-CM | POA: Diagnosis not present

## 2022-08-26 DIAGNOSIS — I129 Hypertensive chronic kidney disease with stage 1 through stage 4 chronic kidney disease, or unspecified chronic kidney disease: Secondary | ICD-10-CM | POA: Diagnosis not present

## 2022-08-26 DIAGNOSIS — M19012 Primary osteoarthritis, left shoulder: Secondary | ICD-10-CM | POA: Diagnosis not present

## 2022-08-26 DIAGNOSIS — A0472 Enterocolitis due to Clostridium difficile, not specified as recurrent: Secondary | ICD-10-CM | POA: Diagnosis not present

## 2022-08-26 DIAGNOSIS — I69354 Hemiplegia and hemiparesis following cerebral infarction affecting left non-dominant side: Secondary | ICD-10-CM | POA: Diagnosis not present

## 2022-08-26 LAB — VITAMIN B12: Vitamin B-12: 148 pg/mL — ABNORMAL LOW (ref 211–911)

## 2022-08-26 NOTE — Telephone Encounter (Signed)
Called patients daughter felicia and left a message for a call back.

## 2022-08-26 NOTE — Telephone Encounter (Signed)
Called to let patient's daughter know that patient's B12 level is very low. I left a message asking for a call back. I will recommend B12 supplementation with 1000 mcg daily (over the counter).  Kai Levins, MD [facility]

## 2022-08-26 NOTE — Telephone Encounter (Signed)
Patients daughter is returning a call to dr.hill. she said she will call back about 3pm, she has some errands to run

## 2022-08-26 NOTE — Telephone Encounter (Signed)
Spoke with pt daughter informed her that Dr Berdine Addison stated her moms B12  is low and that he recommend B12 supplementation with 1000 mcg daily (over the counter).

## 2022-08-28 DIAGNOSIS — R9082 White matter disease, unspecified: Secondary | ICD-10-CM | POA: Diagnosis not present

## 2022-08-28 DIAGNOSIS — I639 Cerebral infarction, unspecified: Secondary | ICD-10-CM | POA: Diagnosis not present

## 2022-08-29 DIAGNOSIS — A0472 Enterocolitis due to Clostridium difficile, not specified as recurrent: Secondary | ICD-10-CM

## 2022-08-29 HISTORY — DX: Enterocolitis due to Clostridium difficile, not specified as recurrent: A04.72

## 2022-08-29 LAB — VITAMIN B1: Vitamin B1 (Thiamine): <6 nmol/L — ABNORMAL LOW (ref 8–30)

## 2022-08-30 ENCOUNTER — Telehealth: Payer: Self-pay | Admitting: Neurology

## 2022-08-30 NOTE — Telephone Encounter (Signed)
Attempted to call patient's daughter with results. There was no answer. I left a message asking for a call back.  I am going to recommend patient also start thiamine supplementation as her B1 was low. I will recommend 100 mg of thiamine (over the counter) daily.  I also wanted to ask if patient was able to get her MRI brain this weekend as I do not currently see anything in the chart.  Kai Levins, MD [facility]

## 2022-08-31 ENCOUNTER — Telehealth: Payer: Self-pay | Admitting: Neurology

## 2022-08-31 ENCOUNTER — Ambulatory Visit
Admission: RE | Admit: 2022-08-31 | Discharge: 2022-08-31 | Disposition: A | Discharge status: Home / Self Care | Payer: Self-pay | Source: Ambulatory Visit | Attending: Neurology | Admitting: Neurology

## 2022-08-31 DIAGNOSIS — I129 Hypertensive chronic kidney disease with stage 1 through stage 4 chronic kidney disease, or unspecified chronic kidney disease: Secondary | ICD-10-CM | POA: Diagnosis not present

## 2022-08-31 DIAGNOSIS — R269 Unspecified abnormalities of gait and mobility: Secondary | ICD-10-CM

## 2022-08-31 DIAGNOSIS — I69354 Hemiplegia and hemiparesis following cerebral infarction affecting left non-dominant side: Secondary | ICD-10-CM | POA: Diagnosis not present

## 2022-08-31 DIAGNOSIS — R410 Disorientation, unspecified: Secondary | ICD-10-CM

## 2022-08-31 DIAGNOSIS — K5732 Diverticulitis of large intestine without perforation or abscess without bleeding: Secondary | ICD-10-CM | POA: Diagnosis not present

## 2022-08-31 DIAGNOSIS — M19012 Primary osteoarthritis, left shoulder: Secondary | ICD-10-CM | POA: Diagnosis not present

## 2022-08-31 DIAGNOSIS — A0472 Enterocolitis due to Clostridium difficile, not specified as recurrent: Secondary | ICD-10-CM | POA: Diagnosis not present

## 2022-08-31 DIAGNOSIS — E1122 Type 2 diabetes mellitus with diabetic chronic kidney disease: Secondary | ICD-10-CM | POA: Diagnosis not present

## 2022-08-31 DIAGNOSIS — I69398 Other sequelae of cerebral infarction: Secondary | ICD-10-CM

## 2022-08-31 DIAGNOSIS — R296 Repeated falls: Secondary | ICD-10-CM

## 2022-08-31 DIAGNOSIS — K5792 Diverticulitis of intestine, part unspecified, without perforation or abscess without bleeding: Secondary | ICD-10-CM

## 2022-08-31 DIAGNOSIS — Z8673 Personal history of transient ischemic attack (TIA), and cerebral infarction without residual deficits: Secondary | ICD-10-CM

## 2022-08-31 NOTE — Telephone Encounter (Signed)
Patients daughter is returning a call to dr.hill. she will keep her phone nearby

## 2022-08-31 NOTE — Addendum Note (Signed)
Addended by: Renae Gloss on: 08/31/2022 09:31 AM   Modules accepted: Orders

## 2022-08-31 NOTE — Telephone Encounter (Signed)
Returned patient's daughter's call and discussed the results of patient's MRI (see previous telephone note). Patient will follow up closely with PCP and with me in 1 month.  Kai Levins, MD [facility]

## 2022-08-31 NOTE — Telephone Encounter (Signed)
Spoke to daughter about low thiamine. She will pick up B1 supplement, 100 mg daily. I explained that the low B1 and B12 are likely due to her poor nutrition and are contributing to her symptoms.  She also said her mother did have her MRI brain on Saturday. I was given phone number to call. We will attempt get a copy of the images and report. I will be back in contact when I have reviewed those results.  All questions were answered.  Kai Levins, MD [facility]

## 2022-08-31 NOTE — Telephone Encounter (Signed)
Patient's daughter returned Dr Cathey Endow call. Left voicemail to please return her call.

## 2022-08-31 NOTE — Telephone Encounter (Signed)
I received patient's MRI brain from [facility] in [place]. I independently looked at the images to confirm the radiology report. There is no new stroke. There are T2 hyperintensities in the white matter, perhaps slightly progressed from MRI brain from 2018, but overall, similar to prior, and consistent with chronic microvascular ischemia.  I called patient's daughter to update her on the results. I got her voicemail and left a detailed message and offered to discuss further if she would like to return my call.  Kai Levins, MD [facility] Neurology

## 2022-09-01 ENCOUNTER — Ambulatory Visit: Payer: Medicare Other | Admitting: Physician Assistant

## 2022-09-01 DIAGNOSIS — E1122 Type 2 diabetes mellitus with diabetic chronic kidney disease: Secondary | ICD-10-CM | POA: Diagnosis not present

## 2022-09-01 DIAGNOSIS — I69354 Hemiplegia and hemiparesis following cerebral infarction affecting left non-dominant side: Secondary | ICD-10-CM | POA: Diagnosis not present

## 2022-09-01 DIAGNOSIS — I129 Hypertensive chronic kidney disease with stage 1 through stage 4 chronic kidney disease, or unspecified chronic kidney disease: Secondary | ICD-10-CM | POA: Diagnosis not present

## 2022-09-01 DIAGNOSIS — A0472 Enterocolitis due to Clostridium difficile, not specified as recurrent: Secondary | ICD-10-CM | POA: Diagnosis not present

## 2022-09-01 DIAGNOSIS — M19012 Primary osteoarthritis, left shoulder: Secondary | ICD-10-CM | POA: Diagnosis not present

## 2022-09-01 DIAGNOSIS — K5732 Diverticulitis of large intestine without perforation or abscess without bleeding: Secondary | ICD-10-CM | POA: Diagnosis not present

## 2022-09-02 DIAGNOSIS — E538 Deficiency of other specified B group vitamins: Secondary | ICD-10-CM | POA: Diagnosis not present

## 2022-09-02 DIAGNOSIS — F0390 Unspecified dementia without behavioral disturbance: Secondary | ICD-10-CM | POA: Diagnosis not present

## 2022-09-02 DIAGNOSIS — I10 Essential (primary) hypertension: Secondary | ICD-10-CM | POA: Diagnosis not present

## 2022-09-02 DIAGNOSIS — I639 Cerebral infarction, unspecified: Secondary | ICD-10-CM | POA: Diagnosis not present

## 2022-09-02 DIAGNOSIS — E519 Thiamine deficiency, unspecified: Secondary | ICD-10-CM | POA: Diagnosis not present

## 2022-09-06 DIAGNOSIS — I69354 Hemiplegia and hemiparesis following cerebral infarction affecting left non-dominant side: Secondary | ICD-10-CM | POA: Diagnosis not present

## 2022-09-06 DIAGNOSIS — K5732 Diverticulitis of large intestine without perforation or abscess without bleeding: Secondary | ICD-10-CM | POA: Diagnosis not present

## 2022-09-06 DIAGNOSIS — M19012 Primary osteoarthritis, left shoulder: Secondary | ICD-10-CM | POA: Diagnosis not present

## 2022-09-06 DIAGNOSIS — E1122 Type 2 diabetes mellitus with diabetic chronic kidney disease: Secondary | ICD-10-CM | POA: Diagnosis not present

## 2022-09-06 DIAGNOSIS — A0472 Enterocolitis due to Clostridium difficile, not specified as recurrent: Secondary | ICD-10-CM | POA: Diagnosis not present

## 2022-09-06 DIAGNOSIS — I129 Hypertensive chronic kidney disease with stage 1 through stage 4 chronic kidney disease, or unspecified chronic kidney disease: Secondary | ICD-10-CM | POA: Diagnosis not present

## 2022-09-08 DIAGNOSIS — I69354 Hemiplegia and hemiparesis following cerebral infarction affecting left non-dominant side: Secondary | ICD-10-CM | POA: Diagnosis not present

## 2022-09-08 DIAGNOSIS — I129 Hypertensive chronic kidney disease with stage 1 through stage 4 chronic kidney disease, or unspecified chronic kidney disease: Secondary | ICD-10-CM | POA: Diagnosis not present

## 2022-09-08 DIAGNOSIS — A0472 Enterocolitis due to Clostridium difficile, not specified as recurrent: Secondary | ICD-10-CM | POA: Diagnosis not present

## 2022-09-08 DIAGNOSIS — M19012 Primary osteoarthritis, left shoulder: Secondary | ICD-10-CM | POA: Diagnosis not present

## 2022-09-08 DIAGNOSIS — K5732 Diverticulitis of large intestine without perforation or abscess without bleeding: Secondary | ICD-10-CM | POA: Diagnosis not present

## 2022-09-08 DIAGNOSIS — E1122 Type 2 diabetes mellitus with diabetic chronic kidney disease: Secondary | ICD-10-CM | POA: Diagnosis not present

## 2022-09-12 ENCOUNTER — Encounter (HOSPITAL_COMMUNITY): Payer: Self-pay | Admitting: *Deleted

## 2022-09-12 ENCOUNTER — Inpatient Hospital Stay (HOSPITAL_COMMUNITY)
Admission: EM | Admit: 2022-09-12 | Discharge: 2022-09-16 | DRG: 640 | Disposition: A | Discharge status: Home / Self Care | Payer: Medicare Other | Attending: Internal Medicine | Admitting: Internal Medicine

## 2022-09-12 ENCOUNTER — Other Ambulatory Visit: Payer: Self-pay

## 2022-09-12 ENCOUNTER — Emergency Department (HOSPITAL_COMMUNITY): Payer: Medicare Other

## 2022-09-12 ENCOUNTER — Observation Stay (HOSPITAL_COMMUNITY): Payer: Medicare Other

## 2022-09-12 DIAGNOSIS — D649 Anemia, unspecified: Secondary | ICD-10-CM | POA: Diagnosis present

## 2022-09-12 DIAGNOSIS — R482 Apraxia: Secondary | ICD-10-CM | POA: Diagnosis present

## 2022-09-12 DIAGNOSIS — I69354 Hemiplegia and hemiparesis following cerebral infarction affecting left non-dominant side: Secondary | ICD-10-CM

## 2022-09-12 DIAGNOSIS — E785 Hyperlipidemia, unspecified: Secondary | ICD-10-CM | POA: Diagnosis present

## 2022-09-12 DIAGNOSIS — Z823 Family history of stroke: Secondary | ICD-10-CM | POA: Diagnosis not present

## 2022-09-12 DIAGNOSIS — G959 Disease of spinal cord, unspecified: Secondary | ICD-10-CM | POA: Diagnosis not present

## 2022-09-12 DIAGNOSIS — F419 Anxiety disorder, unspecified: Secondary | ICD-10-CM | POA: Diagnosis present

## 2022-09-12 DIAGNOSIS — Z8249 Family history of ischemic heart disease and other diseases of the circulatory system: Secondary | ICD-10-CM

## 2022-09-12 DIAGNOSIS — R40244 Other coma, without documented Glasgow coma scale score, or with partial score reported, unspecified time: Secondary | ICD-10-CM | POA: Diagnosis not present

## 2022-09-12 DIAGNOSIS — I10 Essential (primary) hypertension: Secondary | ICD-10-CM | POA: Diagnosis present

## 2022-09-12 DIAGNOSIS — R739 Hyperglycemia, unspecified: Secondary | ICD-10-CM | POA: Diagnosis not present

## 2022-09-12 DIAGNOSIS — Z794 Long term (current) use of insulin: Secondary | ICD-10-CM | POA: Diagnosis not present

## 2022-09-12 DIAGNOSIS — Z91040 Latex allergy status: Secondary | ICD-10-CM

## 2022-09-12 DIAGNOSIS — G4700 Insomnia, unspecified: Secondary | ICD-10-CM | POA: Diagnosis present

## 2022-09-12 DIAGNOSIS — Z83719 Family history of colon polyps, unspecified: Secondary | ICD-10-CM

## 2022-09-12 DIAGNOSIS — F32A Depression, unspecified: Secondary | ICD-10-CM | POA: Diagnosis present

## 2022-09-12 DIAGNOSIS — R29810 Facial weakness: Secondary | ICD-10-CM | POA: Diagnosis present

## 2022-09-12 DIAGNOSIS — Z7951 Long term (current) use of inhaled steroids: Secondary | ICD-10-CM

## 2022-09-12 DIAGNOSIS — E876 Hypokalemia: Secondary | ICD-10-CM | POA: Insufficient documentation

## 2022-09-12 DIAGNOSIS — J45909 Unspecified asthma, uncomplicated: Secondary | ICD-10-CM | POA: Diagnosis present

## 2022-09-12 DIAGNOSIS — R54 Age-related physical debility: Secondary | ICD-10-CM | POA: Diagnosis present

## 2022-09-12 DIAGNOSIS — D539 Nutritional anemia, unspecified: Secondary | ICD-10-CM | POA: Diagnosis present

## 2022-09-12 DIAGNOSIS — R634 Abnormal weight loss: Secondary | ICD-10-CM | POA: Diagnosis present

## 2022-09-12 DIAGNOSIS — E512 Wernicke's encephalopathy: Secondary | ICD-10-CM | POA: Diagnosis not present

## 2022-09-12 DIAGNOSIS — Z6822 Body mass index (BMI) 22.0-22.9, adult: Secondary | ICD-10-CM

## 2022-09-12 DIAGNOSIS — D631 Anemia in chronic kidney disease: Secondary | ICD-10-CM | POA: Diagnosis present

## 2022-09-12 DIAGNOSIS — R29818 Other symptoms and signs involving the nervous system: Secondary | ICD-10-CM | POA: Diagnosis not present

## 2022-09-12 DIAGNOSIS — Z974 Presence of external hearing-aid: Secondary | ICD-10-CM

## 2022-09-12 DIAGNOSIS — E1122 Type 2 diabetes mellitus with diabetic chronic kidney disease: Secondary | ICD-10-CM | POA: Diagnosis present

## 2022-09-12 DIAGNOSIS — Z8711 Personal history of peptic ulcer disease: Secondary | ICD-10-CM

## 2022-09-12 DIAGNOSIS — E1142 Type 2 diabetes mellitus with diabetic polyneuropathy: Secondary | ICD-10-CM | POA: Diagnosis present

## 2022-09-12 DIAGNOSIS — Z79899 Other long term (current) drug therapy: Secondary | ICD-10-CM

## 2022-09-12 DIAGNOSIS — M1990 Unspecified osteoarthritis, unspecified site: Secondary | ICD-10-CM | POA: Diagnosis present

## 2022-09-12 DIAGNOSIS — E1165 Type 2 diabetes mellitus with hyperglycemia: Secondary | ICD-10-CM | POA: Diagnosis present

## 2022-09-12 DIAGNOSIS — Z841 Family history of disorders of kidney and ureter: Secondary | ICD-10-CM

## 2022-09-12 DIAGNOSIS — Z881 Allergy status to other antibiotic agents status: Secondary | ICD-10-CM

## 2022-09-12 DIAGNOSIS — M549 Dorsalgia, unspecified: Secondary | ICD-10-CM | POA: Insufficient documentation

## 2022-09-12 DIAGNOSIS — N184 Chronic kidney disease, stage 4 (severe): Secondary | ICD-10-CM | POA: Diagnosis not present

## 2022-09-12 DIAGNOSIS — F809 Developmental disorder of speech and language, unspecified: Secondary | ICD-10-CM | POA: Diagnosis present

## 2022-09-12 DIAGNOSIS — R55 Syncope and collapse: Secondary | ICD-10-CM | POA: Diagnosis not present

## 2022-09-12 DIAGNOSIS — I129 Hypertensive chronic kidney disease with stage 1 through stage 4 chronic kidney disease, or unspecified chronic kidney disease: Secondary | ICD-10-CM | POA: Diagnosis present

## 2022-09-12 DIAGNOSIS — E039 Hypothyroidism, unspecified: Secondary | ICD-10-CM | POA: Diagnosis present

## 2022-09-12 DIAGNOSIS — R296 Repeated falls: Secondary | ICD-10-CM | POA: Diagnosis present

## 2022-09-12 DIAGNOSIS — E519 Thiamine deficiency, unspecified: Principal | ICD-10-CM | POA: Insufficient documentation

## 2022-09-12 DIAGNOSIS — R4182 Altered mental status, unspecified: Secondary | ICD-10-CM | POA: Diagnosis present

## 2022-09-12 DIAGNOSIS — Z79890 Hormone replacement therapy (postmenopausal): Secondary | ICD-10-CM

## 2022-09-12 DIAGNOSIS — Z825 Family history of asthma and other chronic lower respiratory diseases: Secondary | ICD-10-CM

## 2022-09-12 DIAGNOSIS — G9341 Metabolic encephalopathy: Secondary | ICD-10-CM | POA: Diagnosis not present

## 2022-09-12 DIAGNOSIS — Z833 Family history of diabetes mellitus: Secondary | ICD-10-CM

## 2022-09-12 DIAGNOSIS — Z888 Allergy status to other drugs, medicaments and biological substances status: Secondary | ICD-10-CM

## 2022-09-12 DIAGNOSIS — R509 Fever, unspecified: Secondary | ICD-10-CM | POA: Diagnosis not present

## 2022-09-12 DIAGNOSIS — E538 Deficiency of other specified B group vitamins: Secondary | ICD-10-CM | POA: Diagnosis present

## 2022-09-12 DIAGNOSIS — Z7982 Long term (current) use of aspirin: Secondary | ICD-10-CM

## 2022-09-12 DIAGNOSIS — Z86010 Personal history of colonic polyps: Secondary | ICD-10-CM

## 2022-09-12 LAB — CBG MONITORING, ED
Glucose-Capillary: 278 mg/dL — ABNORMAL HIGH (ref 70–99)
Glucose-Capillary: 186 mg/dL — ABNORMAL HIGH (ref 70–99)

## 2022-09-12 LAB — PROTIME-INR
INR: 1.1 (ref 0.8–1.2)
Prothrombin Time: 13.7 seconds (ref 11.4–15.2)

## 2022-09-12 LAB — CBC
HCT: 35.1 % — ABNORMAL LOW (ref 36.0–46.0)
Hemoglobin: 11.8 g/dL — ABNORMAL LOW (ref 12.0–15.0)
MCH: 30.3 pg (ref 26.0–34.0)
MCHC: 33.6 g/dL (ref 30.0–36.0)
MCV: 90.2 fL (ref 80.0–100.0)
Platelets: 237 10*3/uL (ref 150–400)
RBC: 3.89 MIL/uL (ref 3.87–5.11)
RDW: 13.4 % (ref 11.5–15.5)
WBC: 8.6 10*3/uL (ref 4.0–10.5)
nRBC: 0 % (ref 0.0–0.2)

## 2022-09-12 LAB — I-STAT CHEM 8, ED
BUN: 23 mg/dL (ref 8–23)
Calcium, Ion: 1.19 mmol/L (ref 1.15–1.40)
Chloride: 103 mmol/L (ref 98–111)
Creatinine, Ser: 1.7 mg/dL — ABNORMAL HIGH (ref 0.44–1.00)
Glucose, Bld: 274 mg/dL — ABNORMAL HIGH (ref 70–99)
HCT: 35 % — ABNORMAL LOW (ref 36.0–46.0)
Hemoglobin: 11.9 g/dL — ABNORMAL LOW (ref 12.0–15.0)
Potassium: 3.7 mmol/L (ref 3.5–5.1)
Sodium: 139 mmol/L (ref 135–145)
TCO2: 27 mmol/L (ref 22–32)

## 2022-09-12 LAB — COMPREHENSIVE METABOLIC PANEL
ALT: 16 U/L (ref 0–44)
AST: 17 U/L (ref 15–41)
Albumin: 3.4 g/dL — ABNORMAL LOW (ref 3.5–5.0)
Alkaline Phosphatase: 53 U/L (ref 38–126)
Anion gap: 11 (ref 5–15)
BUN: 20 mg/dL (ref 8–23)
CO2: 25 mmol/L (ref 22–32)
Calcium: 9.3 mg/dL (ref 8.9–10.3)
Chloride: 103 mmol/L (ref 98–111)
Creatinine, Ser: 1.8 mg/dL — ABNORMAL HIGH (ref 0.44–1.00)
GFR, Estimated: 28 mL/min — ABNORMAL LOW (ref >60)
Glucose, Bld: 284 mg/dL — ABNORMAL HIGH (ref 70–99)
Potassium: 3.7 mmol/L (ref 3.5–5.1)
Sodium: 139 mmol/L (ref 135–145)
Total Bilirubin: 0.3 mg/dL (ref 0.3–1.2)
Total Protein: 6.1 g/dL — ABNORMAL LOW (ref 6.5–8.1)

## 2022-09-12 LAB — DIFFERENTIAL
Abs Immature Granulocytes: 0.03 10*3/uL (ref 0.00–0.07)
Basophils Absolute: 0.1 10*3/uL (ref 0.0–0.1)
Basophils Relative: 1 %
Eosinophils Absolute: 0.1 10*3/uL (ref 0.0–0.5)
Eosinophils Relative: 1 %
Immature Granulocytes: 0 %
Lymphocytes Relative: 29 %
Lymphs Abs: 2.5 10*3/uL (ref 0.7–4.0)
Monocytes Absolute: 0.6 10*3/uL (ref 0.1–1.0)
Monocytes Relative: 7 %
Neutro Abs: 5.4 10*3/uL (ref 1.7–7.7)
Neutrophils Relative %: 62 %

## 2022-09-12 LAB — APTT: aPTT: 30 seconds (ref 24–36)

## 2022-09-12 LAB — ETHANOL: Alcohol, Ethyl (B): <10 mg/dL (ref <10)

## 2022-09-12 LAB — FOLATE: Folate: 5.1 ng/mL — ABNORMAL LOW (ref >5.9)

## 2022-09-12 MED ORDER — MELATONIN 5 MG PO TABS
5.0000 mg | ORAL_TABLET | Freq: Every evening | ORAL | Status: DC | PRN
  Administered 2022-09-14 – 2022-09-15 (×2): 5 mg via ORAL
  Filled 2022-09-12 (×2): qty 1

## 2022-09-12 MED ORDER — POLYETHYLENE GLYCOL 3350 17 G PO PACK: 17.0000 g | PACK | Freq: Every day | ORAL | Status: DC | PRN

## 2022-09-12 MED ORDER — CYANOCOBALAMIN 1000 MCG/ML IJ SOLN
1000.0000 ug | Freq: Every day | INTRAMUSCULAR | Status: DC
  Administered 2022-09-13: 1000 ug via INTRAMUSCULAR
  Filled 2022-09-12 (×2): qty 1

## 2022-09-12 MED ORDER — SODIUM CHLORIDE 0.9% FLUSH
3.0000 mL | Freq: Once | INTRAVENOUS | Status: AC
  Administered 2022-09-12: 3 mL via INTRAVENOUS

## 2022-09-12 MED ORDER — THIAMINE HCL 100 MG/ML IJ SOLN
250.0000 mg | Freq: Every day | INTRAVENOUS | Status: DC
  Filled 2022-09-12: qty 2.5

## 2022-09-12 MED ORDER — INSULIN ASPART 100 UNIT/ML IJ SOLN
0.0000 [IU] | Freq: Three times a day (TID) | INTRAMUSCULAR | Status: DC
  Administered 2022-09-13 – 2022-09-14 (×2): 1 [IU] via SUBCUTANEOUS
  Administered 2022-09-15 (×3): 2 [IU] via SUBCUTANEOUS
  Administered 2022-09-16 (×3): 1 [IU] via SUBCUTANEOUS

## 2022-09-12 MED ORDER — CLONIDINE HCL 0.2 MG/24HR TD PTWK: 0.2000 mg | MEDICATED_PATCH | TRANSDERMAL | Status: DC

## 2022-09-12 MED ORDER — ADULT MULTIVITAMIN W/MINERALS CH
1.0000 | ORAL_TABLET | Freq: Every day | ORAL | Status: DC
  Administered 2022-09-13 – 2022-09-16 (×4): 1 via ORAL
  Filled 2022-09-12 (×4): qty 1

## 2022-09-12 MED ORDER — ENOXAPARIN SODIUM 30 MG/0.3ML IJ SOSY
30.0000 mg | PREFILLED_SYRINGE | INTRAMUSCULAR | Status: DC
  Administered 2022-09-12: 30 mg via SUBCUTANEOUS
  Filled 2022-09-12: qty 0.3

## 2022-09-12 MED ORDER — THIAMINE HCL 100 MG/ML IJ SOLN
500.0000 mg | Freq: Three times a day (TID) | INTRAVENOUS | Status: AC
  Administered 2022-09-13 – 2022-09-15 (×8): 500 mg via INTRAVENOUS
  Filled 2022-09-12 (×8): qty 5

## 2022-09-12 MED ORDER — LORAZEPAM 2 MG/ML IJ SOLN
0.5000 mg | Freq: Once | INTRAMUSCULAR | Status: AC
  Administered 2022-09-13: 0.5 mg via INTRAVENOUS
  Filled 2022-09-12: qty 1

## 2022-09-12 MED ORDER — ACETAMINOPHEN 325 MG PO TABS
650.0000 mg | ORAL_TABLET | Freq: Four times a day (QID) | ORAL | Status: DC | PRN
  Administered 2022-09-14 – 2022-09-15 (×2): 650 mg via ORAL
  Filled 2022-09-12 (×2): qty 2

## 2022-09-12 MED ORDER — SODIUM CHLORIDE 0.9 % IV SOLN: INTRAVENOUS | Status: DC

## 2022-09-12 MED ORDER — LEVOTHYROXINE SODIUM 88 MCG PO TABS
88.0000 ug | ORAL_TABLET | Freq: Every day | ORAL | Status: DC
  Administered 2022-09-13 – 2022-09-16 (×4): 88 ug via ORAL
  Filled 2022-09-12 (×4): qty 1

## 2022-09-12 MED ORDER — THIAMINE HCL 100 MG/ML IJ SOLN: 100.0000 mg | Freq: Every day | INTRAMUSCULAR | Status: DC

## 2022-09-12 MED ORDER — ONDANSETRON HCL 4 MG/2ML IJ SOLN: 4.0000 mg | Freq: Four times a day (QID) | INTRAMUSCULAR | Status: DC | PRN

## 2022-09-12 MED ORDER — THIAMINE HCL 100 MG/ML IJ SOLN
500.0000 mg | Freq: Once | INTRAVENOUS | Status: AC
  Administered 2022-09-12: 500 mg via INTRAVENOUS
  Filled 2022-09-12: qty 5

## 2022-09-12 MED ORDER — SERTRALINE HCL 50 MG PO TABS
150.0000 mg | ORAL_TABLET | Freq: Every day | ORAL | Status: DC
  Administered 2022-09-12 – 2022-09-15 (×4): 150 mg via ORAL
  Filled 2022-09-12 (×4): qty 1

## 2022-09-12 MED ORDER — INSULIN ASPART 100 UNIT/ML IJ SOLN: 0.0000 [IU] | Freq: Every day | INTRAMUSCULAR | Status: DC

## 2022-09-12 NOTE — ED Provider Notes (Signed)
[facility] EMERGENCY DEPARTMENT Provider Note   CSN: [id number] Arrival date & time: 09/12/22  1843  An emergency department physician performed an initial assessment on this suspected stroke patient at 34.  History  No chief complaint on file.   Diana Harper is a 86 y.o. female.  HPI 86 year old female presents as a code stroke.  Family was assisting patient to the bedside commode when she all of a sudden could not follow commands.  It seems like she briefly passed out and the daughter later confirmed that the patient seemed to be bearing down and she thought she was having a vagal episode.  Patient members getting to the toilet but does not member what happened after that.  She denies any current headache, chest pain, visual complaints.  She has chronic shoulder difficulties and on exam her left leg seems slightly weaker than the right but she states has been that way for at least a week.   Home Medications Prior to Admission medications   Medication Sig Start Date End Date Taking? Authorizing Provider  acetaminophen (TYLENOL) 500 MG tablet Take 1,000 mg by mouth every 8 (eight) hours as needed for mild pain, headache or fever.   Yes [provider]  BAYER LOW DOSE 81 MG tablet Take 81 mg by mouth daily.   Yes [provider]  buPROPion (WELLBUTRIN XL) 150 MG 24 hr tablet Take 150 mg by mouth daily. 04/05/22  Yes [provider]  Calcium Carbonate (CALTRATE 600 PO) Take 600 mg by mouth daily.   Yes [provider]  Cholecalciferol (VITAMIN D3) 25 MCG (1000 UT) CAPS Take 1,000 Units by mouth daily.   Yes [provider]  Cyanocobalamin (VITAMIN B-12) 1000 MCG SUBL Place 1 tablet under the tongue daily.   Yes [provider]  Glucagon HCl 1 MG SOLR Inject 1 mg into the skin as needed (for a blood sugar <67).   Yes [provider]  insulin degludec (TRESIBA) 200 UNIT/ML FlexTouch Pen Inject 30 Units  into the skin See admin instructions. Inject 30 units into the skin daily before brunch   Yes [provider]  levothyroxine (SYNTHROID) 88 MCG tablet Take 88 mcg by mouth daily before breakfast.   Yes [provider]  nitroGLYCERIN (NITROSTAT) 0.4 MG SL tablet Place 1 tablet (0.4 mg total) under the tongue every 5 (five) minutes as needed for chest pain. 01/20/21 09/12/22 Yes Cantwell, Celeste C, PA-C  NOVOLOG FLEXPEN 100 UNIT/ML FlexPen Inject 5-10 Units into the skin See admin instructions. Inject 5 units into the skin before meals if BGL is 100-150, 7 units if BGL is 151-200, and 10 units if BGL is greater than 200 05/21/22  Yes [provider]  sertraline (ZOLOFT) 50 MG tablet Take 150 mg by mouth at bedtime.   Yes [provider]  [name] 200-62.5-25 MCG/ACT AEPB Inhale 1 puff into the lungs daily as needed (for flares).   Yes [provider]  Continuous Blood Gluc Sensor (FREESTYLE LIBRE 14 DAY SENSOR) MISC Inject 1 Device into the skin every 14 (fourteen) days.    [provider]      Allergies    Latex, Onion, Other, Flexeril [cyclobenzaprine], Lisinopril, Metformin and related, Simvastatin, Tetracycline, and Zithromax [azithromycin]    Review of Systems   Review of Systems  Respiratory:  Negative for shortness of breath.   Cardiovascular:  Negative for chest pain.  Gastrointestinal:  Negative for abdominal pain.  Neurological:  Positive  for syncope and weakness. Negative for headaches.    Physical Exam Updated Vital Signs BP (!) 141/74   Pulse 86   Temp 98.4 F (36.9 C) (Oral)   Resp (!) 23   Ht '5\' 6"'$  (1.676 m)   Wt 63.6 kg   SpO2 98%   BMI 22.63 kg/m  Physical Exam Vitals and nursing note reviewed.  Constitutional:      Appearance: She is well-developed.  HENT:     Head: Normocephalic and atraumatic.  Eyes:     Extraocular Movements: Extraocular movements intact.     Pupils: Pupils are equal, round, and  reactive to light.  Cardiovascular:     Rate and Rhythm: Normal rate and regular rhythm.     Heart sounds: Murmur heard.  Pulmonary:     Effort: Pulmonary effort is normal.     Breath sounds: Normal breath sounds.  Abdominal:     Palpations: Abdomen is soft.     Tenderness: There is no abdominal tenderness.  Skin:    General: Skin is warm and dry.  Neurological:     Mental Status: She is alert.     Comments: Awake, alert. Oriented to person, place, day/year/president. Off on the month (said Nov). No facial droop. Normal strength in RUE. Normal grip strength in left hand but the left upper extremity is difficult to assess due to chronic shoulder issues.  Left lower extremity seems slightly weaker than the right but they are both weak.     ED Results / Procedures / Treatments   Labs (all labs ordered are listed, but only abnormal results are displayed) Labs Reviewed  CBC - Abnormal; Notable for the following components:      Result Value   Hemoglobin 11.8 (*)    HCT 35.1 (*)    All other components within normal limits  COMPREHENSIVE METABOLIC PANEL - Abnormal; Notable for the following components:   Glucose, Bld 284 (*)    Creatinine, Ser 1.80 (*)    Total Protein 6.1 (*)    Albumin 3.4 (*)    GFR, Estimated 28 (*)    All other components within normal limits  FOLATE - Abnormal; Notable for the following components:   Folate 5.1 (*)    All other components within normal limits  I-STAT CHEM 8, ED - Abnormal; Notable for the following components:   Creatinine, Ser 1.70 (*)    Glucose, Bld 274 (*)    Hemoglobin 11.9 (*)    HCT 35.0 (*)    All other components within normal limits  CBG MONITORING, ED - Abnormal; Notable for the following components:   Glucose-Capillary 278 (*)    All other components within normal limits  CBG MONITORING, ED - Abnormal; Notable for the following components:   Glucose-Capillary 186 (*)    All other components within normal limits   PROTIME-INR  APTT  DIFFERENTIAL  ETHANOL  VITAMIN B1  VITAMIN B6  METHYLMALONIC ACID, SERUM  MISC [facility] TEST (SEND OUT)  CBC  BASIC METABOLIC PANEL  MAGNESIUM  PHOSPHORUS  VITAMIN B1  VITAMIN B12  COPPER, SERUM  ZINC    EKG EKG Interpretation  Date/Time:  Sunday September 12 2022 20:13:37 EDT Ventricular Rate:  79 PR Interval:  147 QRS Duration: 88 QT Interval:  409 QTC Calculation: 469 R Axis:   -10 Text Interpretation: Sinus rhythm no acute changes when compared to Aug 2023 Confirmed by Sherwood Gambler [phone]) on 09/12/2022 8:15:56 PM  Radiology CT HEAD CODE STROKE WO  CONTRAST  Result Date: 09/12/2022 CLINICAL DATA:  Code stroke.  Acute neurologic deficit EXAM: CT HEAD WITHOUT CONTRAST TECHNIQUE: Contiguous axial images were obtained from the base of the skull through the vertex without intravenous contrast. RADIATION DOSE REDUCTION: This exam was performed according to the departmental dose-optimization program which includes automated exposure control, adjustment of the mA and/or kV according to patient size and/or use of iterative reconstruction technique. COMPARISON:  None Available. FINDINGS: Brain: There is no mass, hemorrhage or extra-axial collection. There is generalized atrophy without lobar predilection. There is hypoattenuation of the periventricular white matter, most commonly indicating chronic ischemic microangiopathy. Vascular: No abnormal hyperdensity of the major intracranial arteries or dural venous sinuses. No intracranial atherosclerosis. Skull: The visualized skull base, calvarium and extracranial soft tissues are normal. Sinuses/Orbits: No fluid levels or advanced mucosal thickening of the visualized paranasal sinuses. No mastoid or middle ear effusion. The orbits are normal. ASPECTS [facility] Stroke Program Early CT Score) - Ganglionic level infarction (caudate, lentiform nuclei, internal capsule, insula, M1-M3 cortex): 7 - Supraganglionic infarction  (M4-M6 cortex): 3 Total score (0-10 with 10 being normal): 10 IMPRESSION: 1. No acute intracranial abnormality. 2. ASPECTS is 10 Electronically Signed   By: Ulyses Jarred M.D.   On: 09/12/2022 19:01    Procedures Procedures    Medications Ordered in ED Medications  cyanocobalamin (VITAMIN B12) injection 1,000 mcg (has no administration in time range)  0.9 %  sodium chloride infusion ( Intravenous New Bag/Given 09/12/22 2032)  thiamine (VITAMIN B1) 500 mg in normal saline (50 mL) IVPB (has no administration in time range)    Followed by  thiamine (VITAMIN B1) 250 mg in sodium chloride 0.9 % 50 mL IVPB (has no administration in time range)    Followed by  thiamine (VITAMIN B1) injection 100 mg (has no administration in time range)  multivitamin with minerals tablet 1 tablet (has no administration in time range)  enoxaparin (LOVENOX) injection 30 mg (30 mg Subcutaneous Given 09/12/22 2219)  insulin aspart (novoLOG) injection 0-9 Units (has no administration in time range)  insulin aspart (novoLOG) injection 0-5 Units ( Subcutaneous Not Given 09/12/22 2217)  acetaminophen (TYLENOL) tablet 650 mg (has no administration in time range)  polyethylene glycol (MIRALAX / GLYCOLAX) packet 17 g (has no administration in time range)  ondansetron (ZOFRAN) injection 4 mg (has no administration in time range)  melatonin tablet 5 mg (has no administration in time range)  cloNIDine (CATAPRES - Dosed in mg/24 hr) patch 0.2 mg (has no administration in time range)  levothyroxine (SYNTHROID) tablet 88 mcg (has no administration in time range)  sertraline (ZOLOFT) tablet 150 mg (150 mg Oral Given 09/12/22 2217)  LORazepam (ATIVAN) injection 0.5 mg (has no administration in time range)  sodium chloride flush (NS) 0.9 % injection 3 mL (3 mLs Intravenous Given 09/12/22 2032)  thiamine (VITAMIN B1) 500 mg in normal saline (50 mL) IVPB (0 mg Intravenous Stopped 09/12/22 2131)    ED Course/ Medical Decision  Making/ A&P                           Medical Decision Making Amount and/or Complexity of Data Reviewed Labs: ordered. Radiology: ordered.  Risk Prescription drug management. Decision regarding hospitalization.   Patient presents as a code stroke but it seems like this is more of a syncopal episode and then some transient altered mental status.  Neurology is initial exam when she was a code stroke prior to  me seeing her indicated some ophthalmoplegia and with her recent diagnosis of low thiamine they have started IV thiamine.  On my exam after she came back from the scanner, she has normal extraocular movements.  She might be slightly confused but otherwise is well-appearing.  She will be started on gentle fluids given family's concern for dehydration.  Otherwise she will need admission.  EKG is okay on my interpretation.  CT head independently viewed/interpreted by myself and there is no head bleed.  Labs show slight anemia and chronic kidney disease.        Final Clinical Impression(s) / ED Diagnoses Final diagnoses:  Altered mental status, unspecified altered mental status type    Rx / DC Orders ED Discharge Orders     None         Sherwood Gambler, MD 09/12/22 [phone]

## 2022-09-12 NOTE — H&P (Incomplete)
History and Physical  Diana Harper JME:[id number] DOB: 10/28/1936 DOA: 09/12/2022  Referring physician: Dr. Regenia Skeeter, Hana  PCP: [name], MD  Outpatient Specialists: Neurology Patient coming from: Home  Chief Complaint: Altered mental status.  HPI: Diana Harper is a 86 y.o. female with medical history significant for prior CVA, type 2 diabetes, essential hypertension, hyperlipidemia, hypothyroidism, recent C. difficile colitis, vitamin D deficiency, severe thiamine and B12 deficiencies who presented to [facility] ED as a code stroke from home.  The patient lives at home with her daughter.  She was assisting the patient to the bedside commode when all of a sudden could not follow commands.  She had an episode of starring and may have passed out.  EMS was activated.  Upon EMS arrival the patient was not following commands.  Her gaze was fixed.  Last known well 1630.  Seen by neurology/stroke team.  Exam not consistent with large vessel occlusion.  At the time of the visit the patient's mentation has significantly improved and close to baseline.  The patient is a retired Warden/ranger.  At the time of this visit she is alert and oriented.  The patient was admitted by [facility], hospitalist service.  ED Course: Tmax 98.4.  BP 120/68, pulse 85, respiratory 22, O2 saturation 99% on room air.  Lab studies remarkable for serum glucose 274, creatinine 1.70 with baseline creatinine of 1.5.  Vitamin B1 less than 6 on 08/25/2022.  Vitamin B12 148 on 08/25/2022.  Review of Systems: Review of systems as noted in the HPI. All other systems reviewed and are negative.   Past Medical History:  Diagnosis Date   Anemia    Anxiety    Arthritis    Asthma    Biliary colic    Cholelithiasis    CKD (chronic kidney disease), stage III [facility])    nephrologist--  dr Meredeth Ide   Colon polyps    Diverticulosis of colon    Gastric ulcer    Glaucoma    History of asthma    1980's  no longer problem since 1980's    History of diverticulitis of colon    recurrent--  2014;  2013;  2012   Hyperlipidemia    Hypertension    Hypothyroidism    Insulin dependent type 2 diabetes mellitus ([place])    followed by dr Jenny Reichmann lamb[facility] ([facility])    Murmur    Peripheral neuropathy    Stroke [facility])    Vitamin D deficiency    Wears hearing aid    bilateral   Wears partial dentures    lower partial and upper full   Past Surgical History:  Procedure Laterality Date   ABDOMINAL HYSTERECTOMY  1970's   CARDIOVASCULAR STRESS TEST  07/30/2009   normal exercise lexiscan nuclear study w/ no ischemia/  normal LV funciton and wall motion, ef 77%   CATARACT EXTRACTION W/ INTRAOCULAR LENS  IMPLANT, BILATERAL  2014   LAPAROSCOPIC CHOLECYSTECTOMY SINGLE SITE WITH INTRAOPERATIVE CHOLANGIOGRAM N/A 08/25/2016   Procedure: LAPAROSCOPIC CHOLECYSTECTOMY WITH INTRAOPERATIVE CHOLANGIOGRAM;  Surgeon: [name], MD;  Location: [facility];  Service: General;  Laterality: N/A;   LUMBAR SPINE SURGERY  2014   REMOVAL AXILLA CYST Right 1990's    Social History:  reports that she has never smoked. She has never used smokeless tobacco. She reports that she does not drink alcohol and does not use drugs.   Allergies  Allergen Reactions   Latex Anaphylaxis, Shortness Of Breath and Other (See Comments)  Severe respiratory distress   Onion Diarrhea   Other Other (See Comments)    Patient cannot have TREE NUTS and anything with seeds- History of  diverticulitis    Flexeril [Cyclobenzaprine] Other (See Comments)    Caused agitation   Lisinopril Cough   Metformin And Related Diarrhea   Simvastatin Hives and Rash   Tetracycline Rash   Zithromax [Azithromycin] Rash    Family History  Problem Relation Age of Onset   Stroke Mother    Colon polyps Mother    Diabetes Mother    Diabetes Father    Kidney failure Father    COPD Father    Colon polyps Sister    Diabetes Sister    Stroke Brother    Diabetes Brother     Heart attack Maternal Grandmother    Heart disease Maternal Grandfather    Diabetes Paternal Grandmother    Stroke Paternal Grandfather    Hypertension Paternal Grandfather    Diabetes Sister    Diabetes Sister    Diabetes Son    Diabetes Daughter    Colon cancer Neg Hx    Esophageal cancer Neg Hx    Inflammatory bowel disease Neg Hx    Liver disease Neg Hx    Pancreatic cancer Neg Hx    Rectal cancer Neg Hx    Stomach cancer Neg Hx       Prior to Admission medications   Medication Sig Start Date End Date Taking? Authorizing Provider  acetaminophen (TYLENOL) 500 MG tablet Take 1,000 mg by mouth every 8 (eight) hours as needed for mild pain, headache or fever.    [provider]  BAYER LOW DOSE 81 MG tablet Take 81 mg by mouth daily.    [provider]  buPROPion (WELLBUTRIN XL) 150 MG 24 hr tablet Take 150 mg by mouth daily. 04/05/22   [provider]  Calcium Carbonate (CALTRATE 600 PO) Take 600 mg by mouth daily.    [provider]  Cholecalciferol (VITAMIN D3) 25 MCG (1000 UT) CAPS Take 1,000 Units by mouth daily.    [provider]  cloNIDine (CATAPRES - DOSED IN MG/24 HR) 0.2 mg/24hr patch Place 0.2 mg onto the skin every Wednesday.    [provider]  Continuous Blood Gluc Sensor (FREESTYLE LIBRE 14 DAY SENSOR) MISC Inject 1 Device into the skin every 14 (fourteen) days.    [provider]  dicyclomine (BENTYL) 20 MG tablet Take 20 mg by mouth 2 (two) times daily as needed for spasms.    [provider]  Glucagon HCl 1 MG SOLR Inject 1 mg into the skin as needed (for a blood sugar <67).    [provider]  insulin degludec (TRESIBA) 200 UNIT/ML FlexTouch Pen Inject 10 Units into the skin See admin instructions. Inject 10 units into the skin daily before brunch    [provider]  levothyroxine (SYNTHROID) 88 MCG tablet Take 88 mcg by mouth daily before breakfast.    [provider]   loratadine (CLARITIN) 10 MG tablet Take 10 mg by mouth daily.    [provider]  NEXIUM 40 MG capsule Take 40 mg by mouth daily as needed (for reflux). Patient not taking: Reported on 08/09/2022    [provider]  nitroGLYCERIN (NITROSTAT) 0.4 MG SL tablet Place 1 tablet (0.4 mg total) under the tongue every 5 (five) minutes as needed for chest pain. 01/20/21 07/11/22  Cantwell, Celeste C, PA-C  NOVOLOG FLEXPEN 100 UNIT/ML FlexPen  Inject 5-10 Units into the skin See admin instructions. Inject 5 units into the skin before meals if BGL is 100-150, 7 units if BGL is 151-200, and 10 units if BGL is greater than 200 05/21/22   [provider]  sertraline (ZOLOFT) 50 MG tablet Take 150 mg by mouth at bedtime.    [provider]  tamsulosin (FLOMAX) 0.4 MG CAPS capsule Take 0.4 mg by mouth daily.    [provider]  [name] 200-62.5-25 MCG/ACT AEPB Inhale 1 puff into the lungs daily as needed (for flares).    [provider]    Physical Exam: BP 122/70   Pulse 79   Temp 98.4 F (36.9 C) (Oral)   Resp (!) 24   Ht '5\' 6"'$  (1.676 m)   Wt 63.6 kg   SpO2 99%   BMI 22.63 kg/m   General: 86 y.o. year-old female well developed well nourished in no acute distress.  Alert and oriented x3. Cardiovascular: Regular rate and rhythm with no rubs or gallops.  No thyromegaly or JVD noted.  No lower extremity edema. 2/4 pulses in all 4 extremities. Respiratory: Clear to auscultation with no wheezes or rales. Good inspiratory effort. Abdomen: Soft nontender nondistended with normal bowel sounds x4 quadrants. Muskuloskeletal: No cyanosis, clubbing or edema noted bilaterally Neuro: CN II-XII intact, strength, sensation, reflexes Skin: No ulcerative lesions noted or rashes Psychiatry: Judgement and insight appear normal. Mood is appropriate for condition and setting          Labs on Admission:  Basic Metabolic Panel: Recent Labs  Lab 09/12/22 1847  09/12/22 1851  NA 139 139  K 3.7 3.7  CL 103 103  CO2 25  --   GLUCOSE 284* 274*  BUN 20 23  CREATININE 1.80* 1.70*  CALCIUM 9.3  --    Liver Function Tests: Recent Labs  Lab 09/12/22 1847  AST 17  ALT 16  ALKPHOS 53  BILITOT 0.3  PROT 6.1*  ALBUMIN 3.4*   No results for input(s): "LIPASE", "AMYLASE" in the last 168 hours. No results for input(s): "AMMONIA" in the last 168 hours. CBC: Recent Labs  Lab 09/12/22 1847 09/12/22 1851  WBC 8.6  --   NEUTROABS 5.4  --   HGB 11.8* 11.9*  HCT 35.1* 35.0*  MCV 90.2  --   PLT 237  --    Cardiac Enzymes: No results for input(s): "CKTOTAL", "CKMB", "CKMBINDEX", "TROPONINI" in the last 168 hours.  BNP (last 3 results) No results for input(s): "BNP" in the last 8760 hours.  ProBNP (last 3 results) No results for input(s): "PROBNP" in the last 8760 hours.  CBG: Recent Labs  Lab 09/12/22 1846  GLUCAP 278*    Radiological Exams on Admission: CT HEAD CODE STROKE WO CONTRAST  Result Date: 09/12/2022 CLINICAL DATA:  Code stroke.  Acute neurologic deficit EXAM: CT HEAD WITHOUT CONTRAST TECHNIQUE: Contiguous axial images were obtained from the base of the skull through the vertex without intravenous contrast. RADIATION DOSE REDUCTION: This exam was performed according to the departmental dose-optimization program which includes automated exposure control, adjustment of the mA and/or kV according to patient size and/or use of iterative reconstruction technique. COMPARISON:  None Available. FINDINGS: Brain: There is no mass, hemorrhage or extra-axial collection. There is generalized atrophy without lobar predilection. There is hypoattenuation of the periventricular white matter, most commonly indicating chronic ischemic microangiopathy. Vascular: No abnormal hyperdensity of the major intracranial arteries or dural venous sinuses. No intracranial atherosclerosis. Skull: The  visualized skull base, calvarium and extracranial soft tissues  are normal. Sinuses/Orbits: No fluid levels or advanced mucosal thickening of the visualized paranasal sinuses. No mastoid or middle ear effusion. The orbits are normal. ASPECTS [facility] Stroke Program Early CT Score) - Ganglionic level infarction (caudate, lentiform nuclei, internal capsule, insula, M1-M3 cortex): 7 - Supraganglionic infarction (M4-M6 cortex): 3 Total score (0-10 with 10 being normal): 10 IMPRESSION: 1. No acute intracranial abnormality. 2. ASPECTS is 10 Electronically Signed   By: Ulyses Jarred M.D.   On: 09/12/2022 19:01    EKG: I independently viewed the EKG done and my findings are as followed: Sinus rhythm rate of 79.  Nonspecific ST-T changes.  QTc 469.  Assessment/Plan Present on Admission:  AMS (altered mental status)  Principal Problem:   AMS (altered mental status)  Acute metabolic encephalopathy, resolving, suspected secondary to severe thiamine and B12 deficiency. IV thiamine and IM B12 replacement daily. Vitamin B1 less than 6 on 08/25/2022.  Vitamin B12 148 on 08/25/2022. Repeat levels in the morning. Presented as code stroke, exam not consistent with LVO. Follow MRI brain without contrast.  Recent C. difficile colitis Per patient's daughters at bedside patient is now having formed stools Monitor for now  Type 2 diabetes with hyperglycemia Last hemoglobin A1c 7.1 on 07/15/2022 Start insulin sliding scale.  Hypertension Resume home regimen. Monitor vital signs  Hypothyroidism Resume home levothyroxine  Vitamin D3 deficiency Resume home vitamin D supplement  Chronic anxiety/depression Resume home regimen  Ambulatory dysfunction PT OT assessment in the morning Fall precautions.    DVT prophylaxis: Subcu Lovenox daily  Code Status: Full code  Family Communication: Updated the patient' daughters at bedside.  Disposition Plan: Admitted to telemetry medical unit  Consults called: Neurology/stroke team.  Admission status: Observation  status.   Status is: Observation    Kayleen Memos MD [facility] Pager [phone]  If 7PM-7AM, please contact night-coverage [email or web address] Password [facility]  09/12/2022, 8:53 PM

## 2022-09-12 NOTE — Consult Note (Addendum)
Neurology Consultation Reason for Consult: Code stroke Requesting Physician: Sherwood Gambler  CC: Altered mental status  History is obtained from: EMS, chart review  HPI: Diana Harper is a 86 y.o. female with a past medical history significant for diabetes, prior stroke with residual left-sided weakness, hypertension, hyperlipidemia, hypothyroidism, vitamin D deficiency, and recent C. difficile colitis  She was at her home being assisted to the bathroom when she had a syncopal episode.  Subsequently on EMS arrival she was not following commands and her gaze was fixed and midline.  Her mental status is gradually improved over the course of transport.  Family confirms she is back at her recent baseline when they arrived at bedside  She was recently admitted for C. difficile colitis twice in August (discharged 8/19 and 8/30)  Per review of Dr. Elias Else office note, she presented for increasing balance difficulty over the past 2 weeks with delayed speech and movement, anxiety about falling, progressing from walking with a cane to needing a walker to requiring assistance to walk with multiple falls and poor appetite with 10 pound weight loss over 3 weeks and impaired cognition and tremor.  Family reports that after they started oral supplementation, her cognition has slightly improved  Vitamin B1 level was checked and found to be undetectable, B12 was also low.  Oral supplementation was recommended  LKW: 1630 Thrombolytic given?: No, symptoms improving and low concern for stroke IA performed?: No, exam not consistent with a large vessel occlusion Premorbid modified rankin scale:      3 - Moderate disability. Requires some help, but able to walk unassisted.  ROS: Unable to obtain due to altered mental status.   Past Medical History:  Diagnosis Date   Anemia    Anxiety    Arthritis    Asthma    Biliary colic    Cholelithiasis    CKD (chronic kidney disease), stage III [facility])     nephrologist--  dr Meredeth Ide   Colon polyps    Diverticulosis of colon    Gastric ulcer    Glaucoma    History of asthma    1980's  no longer problem since 1980's   History of diverticulitis of colon    recurrent--  2014;  2013;  2012   Hyperlipidemia    Hypertension    Hypothyroidism    Insulin dependent type 2 diabetes mellitus ([place])    followed by dr Jenny Reichmann lambeth ([facility])    Murmur    Peripheral neuropathy    Stroke [facility])    Vitamin D deficiency    Wears hearing aid    bilateral   Wears partial dentures    lower partial and upper full   Past Surgical History:  Procedure Laterality Date   ABDOMINAL HYSTERECTOMY  1970's   CARDIOVASCULAR STRESS TEST  07/30/2009   normal exercise lexiscan nuclear study w/ no ischemia/  normal LV funciton and wall motion, ef 77%   CATARACT EXTRACTION W/ INTRAOCULAR LENS  IMPLANT, BILATERAL  2014   LAPAROSCOPIC CHOLECYSTECTOMY SINGLE SITE WITH INTRAOPERATIVE CHOLANGIOGRAM N/A 08/25/2016   Procedure: LAPAROSCOPIC CHOLECYSTECTOMY WITH INTRAOPERATIVE CHOLANGIOGRAM;  Surgeon: [name], MD;  Location: [place];  Service: General;  Laterality: N/A;   LUMBAR SPINE SURGERY  2014   REMOVAL AXILLA CYST Right 59's     Family History  Problem Relation Age of Onset   Stroke Mother    Colon polyps Mother    Diabetes Mother    Diabetes Father  Kidney failure Father    COPD Father    Colon polyps Sister    Diabetes Sister    Stroke Brother    Diabetes Brother    Heart attack Maternal Grandmother    Heart disease Maternal Grandfather    Diabetes Paternal Grandmother    Stroke Paternal Grandfather    Hypertension Paternal Grandfather    Diabetes Sister    Diabetes Sister    Diabetes Son    Diabetes Daughter    Colon cancer Neg Hx    Esophageal cancer Neg Hx    Inflammatory bowel disease Neg Hx    Liver disease Neg Hx    Pancreatic cancer Neg Hx    Rectal cancer Neg Hx    Stomach cancer Neg Hx    Social  History:  reports that she has never smoked. She has never used smokeless tobacco. She reports that she does not drink alcohol and does not use drugs.   Exam: Current vital signs: BP (!) 119/50 (BP Location: Right Arm)   Pulse 77   Temp 98.4 F (36.9 C) (Oral)   Resp 20   Ht '5\' 6"'$  (1.676 m)   Wt 63.6 kg   SpO2 98%   BMI 22.63 kg/m  Vital signs in last 24 hours: Temp:  [98.4 F (36.9 C)] 98.4 F (36.9 C) (10/15 1918) Pulse Rate:  [77] 77 (10/15 1918) Resp:  [20] 20 (10/15 1918) BP: (119)/(50) 119/50 (10/15 1918) SpO2:  [98 %] 98 % (10/15 1918) Weight:  [63.6 kg] 63.6 kg (10/15 1919)   Physical Exam  Constitutional: Appears malnourished Psych: Affect flat Eyes: No scleral injection HENT: No oropharyngeal obstruction.  MSK: no joint deformities.  Cardiovascular: Mildly tachycardic Respiratory: Effort normal, non-labored breathing GI: Soft.  No distension.    Neuro: Mental Status: Patient is slow to respond, oriented to month and age.  Motor apraxia. Cranial Nerves: II: Visual Fields are full.  III,IV, VI: EOMI without ptosis or diploplia.  Very saccadic and delayed pursuits V: Facial sensation is symmetric to light touch VII: Facial movement is notable for a baseline right facial droop.  VIII: hearing is intact to voice, though hard of hearing X: Uvula elevates symmetrically XI: Shoulder shrug is symmetric. XII: tongue is midline without atrophy or fasciculations.  Motor: Delayed movements.  Has increased tone in the bilateral lower extremities.  Able to maintain both arms and legs antigravity without drift.  Right upper extremity is pain limited due to shoulder pain but 4+ to 5/5 throughout.  Left upper extremity is 5/5 throughout. Sensory: Reports reduced sensation in the left upper extremity Cerebellar: Finger-nose intact bilaterally, heel-to-shin unable to perform due to motor apraxia Gait:  Deferred   I have reviewed labs in epic and the results pertinent  to this consultation are:  Basic Metabolic Panel: Recent Labs  Lab 09/12/22 1847 09/12/22 1851  NA 139 139  K 3.7 3.7  CL 103 103  CO2 25  --   GLUCOSE 284* 274*  BUN 20 23  CREATININE 1.80* 1.70*  CALCIUM 9.3  --     CBC: Recent Labs  Lab 09/12/22 1847 09/12/22 1851  WBC 8.6  --   NEUTROABS 5.4  --   HGB 11.8* 11.9*  HCT 35.1* 35.0*  MCV 90.2  --   PLT 237  --     Coagulation Studies: Recent Labs    09/12/22 1847  LABPROT 13.7  INR 1.1     Latest Reference Range & Units 08/25/22 16:30  Vitamin B1 (Thiamine) 8 - 30 nmol/L <6 (L)  Vitamin B12 211 - 911 pg/mL 148 (L)  (L): Data is abnormally low  I have reviewed the images obtained: Head CT personally reviewed, agree with radiology no acute intracranial process   Impression: 86 year old with recent C. difficile colitis likely leading to multiple nutritional deficiencies which likely explain her gait and cognitive impairments as well as eye movement abnormalities.  Will obtain an MRI to assess for structural sequelae of nutritional abnormalities.  There is some data that oral B12 supplementation is equivalent to IM dosing, however with severe thiamine deficiency high doses of IV thiamine are helpful to ensure adequate CNS levels and therefore will admit the patient primarily for high-dose IV thiamine supplementation.  We will also obtain other nutritional labs.  Her gait impairment and lower extremity spasticity is also concerning for spinal cord involvement.  Suspect vitamin B12 deficiency related subacute combined degeneration, but will obtain MRI C and T-spine to rule out any other acute process  Recommendations:  #Concern for nutritional cognitive impairment -MRI brain,  -Recheck thiamine, B12, MMA, folate, B6, B3 -B12 IM supplementation 1000 mcg IM x7 days -Thiamine 500 mg every 8 hours x3 days followed by 250 mg x 3 days daily followed by 100 mg daily -Multivitamin -Nutritional consult -Management of  comorbidities per primary team, including work-up for etiology of her underlying nutritional deficiencies, potentially related to her recent colitis  #Lower extremity spasticity -C-spine and T-spine without contrast -Copper and zinc levels  Lesleigh Noe MD-PhD [facility] [phone] Available 7 AM to 7 PM, outside these hours please contact Neurologist on call listed on AMION

## 2022-09-12 NOTE — Code Documentation (Signed)
Stroke Response Nurse Documentation Code Documentation  Karsten Vaughn is a 86 y.o. female arriving to [facility]  via [facility] on 09/12/2022 with past medical hx of CVA. On No antithrombotic. Code stroke was activated by ED.   Patient from home where she was LKW at 1830 and now complaining of vision changes. Family was assisting patient to the bedside commode when she stopped following commands. Upon EMS arrival, pt was noted to be starring.  Stroke team at the bedside on patient arrival. Labs drawn and patient cleared for CT by Dr. Regenia Skeeter. Patient to CT with team. NIHSS 4, see documentation for details and code stroke times. Patient with disoriented, right facial droop, left decreased sensation, and dysarthria  on exam.  The following imaging was completed:  CT Head.  Patient is not a candidate for IV Thrombolytic due to symptoms being consistent with prior CVA, per Dr. Curly Shores. Patient is not a candidate for IR due to LVO negative on exam.   Care Plan: q2h NIHSS and VS. MRI. Stat Thiamine level and replacement.  Bedside handoff with ED RN Hassan Rowan.    Camillia Marcy L Kodie Pick  Rapid Response RN

## 2022-09-12 NOTE — ED Triage Notes (Signed)
Pt here via [facility] for Code Stroke.  Family was assisting pt to bedside commode when she became unable to follow commands.  Per ems, pt was sitting on the toilet and was responding to verbal commands when they arrived, but she would have "staring" episodes.

## 2022-09-13 ENCOUNTER — Observation Stay (HOSPITAL_COMMUNITY): Payer: Medicare Other

## 2022-09-13 DIAGNOSIS — R29810 Facial weakness: Secondary | ICD-10-CM | POA: Diagnosis present

## 2022-09-13 DIAGNOSIS — E512 Wernicke's encephalopathy: Secondary | ICD-10-CM | POA: Diagnosis not present

## 2022-09-13 DIAGNOSIS — R55 Syncope and collapse: Secondary | ICD-10-CM | POA: Diagnosis present

## 2022-09-13 DIAGNOSIS — D539 Nutritional anemia, unspecified: Secondary | ICD-10-CM | POA: Diagnosis present

## 2022-09-13 DIAGNOSIS — E1122 Type 2 diabetes mellitus with diabetic chronic kidney disease: Secondary | ICD-10-CM | POA: Diagnosis present

## 2022-09-13 DIAGNOSIS — F419 Anxiety disorder, unspecified: Secondary | ICD-10-CM | POA: Diagnosis present

## 2022-09-13 DIAGNOSIS — E1165 Type 2 diabetes mellitus with hyperglycemia: Secondary | ICD-10-CM | POA: Diagnosis present

## 2022-09-13 DIAGNOSIS — E039 Hypothyroidism, unspecified: Secondary | ICD-10-CM | POA: Diagnosis present

## 2022-09-13 DIAGNOSIS — I69354 Hemiplegia and hemiparesis following cerebral infarction affecting left non-dominant side: Secondary | ICD-10-CM | POA: Diagnosis not present

## 2022-09-13 DIAGNOSIS — G9341 Metabolic encephalopathy: Secondary | ICD-10-CM

## 2022-09-13 DIAGNOSIS — Z8249 Family history of ischemic heart disease and other diseases of the circulatory system: Secondary | ICD-10-CM | POA: Diagnosis not present

## 2022-09-13 DIAGNOSIS — E785 Hyperlipidemia, unspecified: Secondary | ICD-10-CM | POA: Diagnosis present

## 2022-09-13 DIAGNOSIS — R509 Fever, unspecified: Secondary | ICD-10-CM | POA: Diagnosis not present

## 2022-09-13 DIAGNOSIS — R634 Abnormal weight loss: Secondary | ICD-10-CM | POA: Diagnosis present

## 2022-09-13 DIAGNOSIS — E519 Thiamine deficiency, unspecified: Secondary | ICD-10-CM | POA: Diagnosis present

## 2022-09-13 DIAGNOSIS — J45909 Unspecified asthma, uncomplicated: Secondary | ICD-10-CM | POA: Diagnosis present

## 2022-09-13 DIAGNOSIS — I10 Essential (primary) hypertension: Secondary | ICD-10-CM | POA: Diagnosis not present

## 2022-09-13 DIAGNOSIS — E1142 Type 2 diabetes mellitus with diabetic polyneuropathy: Secondary | ICD-10-CM | POA: Diagnosis present

## 2022-09-13 DIAGNOSIS — N184 Chronic kidney disease, stage 4 (severe): Secondary | ICD-10-CM | POA: Diagnosis present

## 2022-09-13 DIAGNOSIS — F32A Depression, unspecified: Secondary | ICD-10-CM | POA: Diagnosis present

## 2022-09-13 DIAGNOSIS — I129 Hypertensive chronic kidney disease with stage 1 through stage 4 chronic kidney disease, or unspecified chronic kidney disease: Secondary | ICD-10-CM | POA: Diagnosis present

## 2022-09-13 DIAGNOSIS — G4700 Insomnia, unspecified: Secondary | ICD-10-CM | POA: Diagnosis present

## 2022-09-13 DIAGNOSIS — R4182 Altered mental status, unspecified: Secondary | ICD-10-CM | POA: Diagnosis not present

## 2022-09-13 DIAGNOSIS — Z823 Family history of stroke: Secondary | ICD-10-CM | POA: Diagnosis not present

## 2022-09-13 DIAGNOSIS — E538 Deficiency of other specified B group vitamins: Secondary | ICD-10-CM | POA: Diagnosis present

## 2022-09-13 DIAGNOSIS — Z794 Long term (current) use of insulin: Secondary | ICD-10-CM | POA: Diagnosis not present

## 2022-09-13 DIAGNOSIS — E876 Hypokalemia: Secondary | ICD-10-CM | POA: Diagnosis present

## 2022-09-13 DIAGNOSIS — D631 Anemia in chronic kidney disease: Secondary | ICD-10-CM | POA: Diagnosis present

## 2022-09-13 LAB — BASIC METABOLIC PANEL
Anion gap: 9 (ref 5–15)
BUN: 16 mg/dL (ref 8–23)
CO2: 24 mmol/L (ref 22–32)
Calcium: 8.9 mg/dL (ref 8.9–10.3)
Chloride: 106 mmol/L (ref 98–111)
Creatinine, Ser: 1.27 mg/dL — ABNORMAL HIGH (ref 0.44–1.00)
GFR, Estimated: 42 mL/min — ABNORMAL LOW (ref >60)
Glucose, Bld: 141 mg/dL — ABNORMAL HIGH (ref 70–99)
Potassium: 3.4 mmol/L — ABNORMAL LOW (ref 3.5–5.1)
Sodium: 139 mmol/L (ref 135–145)

## 2022-09-13 LAB — CBG MONITORING, ED
Glucose-Capillary: 135 mg/dL — ABNORMAL HIGH (ref 70–99)
Glucose-Capillary: 118 mg/dL — ABNORMAL HIGH (ref 70–99)
Glucose-Capillary: 99 mg/dL (ref 70–99)

## 2022-09-13 LAB — PHOSPHORUS: Phosphorus: 2.9 mg/dL (ref 2.5–4.6)

## 2022-09-13 LAB — GLUCOSE, CAPILLARY: Glucose-Capillary: 169 mg/dL — ABNORMAL HIGH (ref 70–99)

## 2022-09-13 LAB — MAGNESIUM: Magnesium: 1.6 mg/dL — ABNORMAL LOW (ref 1.7–2.4)

## 2022-09-13 LAB — VITAMIN B12: Vitamin B-12: >7500 pg/mL — ABNORMAL HIGH (ref 180–914)

## 2022-09-13 MED ORDER — ENOXAPARIN SODIUM 40 MG/0.4ML IJ SOSY
40.0000 mg | PREFILLED_SYRINGE | INTRAMUSCULAR | Status: DC
  Administered 2022-09-13 – 2022-09-15 (×3): 40 mg via SUBCUTANEOUS
  Filled 2022-09-13 (×3): qty 0.4

## 2022-09-13 MED ORDER — SODIUM CHLORIDE 0.9 % IV SOLN: INTRAVENOUS | Status: AC

## 2022-09-13 MED ORDER — MAGNESIUM SULFATE 2 GM/50ML IV SOLN
2.0000 g | Freq: Once | INTRAVENOUS | Status: AC
  Administered 2022-09-13: 2 g via INTRAVENOUS
  Filled 2022-09-13: qty 50

## 2022-09-13 MED ORDER — HYDRALAZINE HCL 20 MG/ML IJ SOLN
5.0000 mg | Freq: Four times a day (QID) | INTRAMUSCULAR | Status: DC | PRN
  Administered 2022-09-13 – 2022-09-14 (×2): 5 mg via INTRAVENOUS
  Filled 2022-09-13 (×3): qty 1

## 2022-09-13 MED ORDER — LORAZEPAM 2 MG/ML IJ SOLN: 1.0000 mg | Freq: Once | INTRAMUSCULAR | Status: DC

## 2022-09-13 MED ORDER — VITAMIN B-12 1000 MCG PO TABS
500.0000 ug | ORAL_TABLET | Freq: Every day | ORAL | Status: DC
  Administered 2022-09-14 – 2022-09-16 (×3): 500 ug via ORAL
  Filled 2022-09-13 (×3): qty 1

## 2022-09-13 MED ORDER — FOLIC ACID 1 MG PO TABS
1.0000 mg | ORAL_TABLET | Freq: Every day | ORAL | Status: DC
  Administered 2022-09-13 – 2022-09-16 (×4): 1 mg via ORAL
  Filled 2022-09-13 (×4): qty 1

## 2022-09-13 MED ORDER — POTASSIUM CHLORIDE CRYS ER 20 MEQ PO TBCR
40.0000 meq | EXTENDED_RELEASE_TABLET | Freq: Once | ORAL | Status: AC
  Administered 2022-09-13: 40 meq via ORAL
  Filled 2022-09-13: qty 2

## 2022-09-13 NOTE — Evaluation (Signed)
Physical Therapy Evaluation Patient Details Name: Diana Harper MRN: [id number] DOB: 1936/08/18 Today's Date: 09/13/2022  History of Present Illness  Pt is an 86 y/o female who presented with AMS with fixed gaze. MRI brain negative, EEG negative. PMH: prior CVA, type 2 diabetes, essential hypertension, hyperlipidemia, hypothyroidism, recent C. difficile colitis  Clinical Impression  Pt admitted secondary to problem above with deficits below. Pt requiring max A +2 for bed mobility and transfers. Pt requiring max A for sequencing and initiation. Family reports pt recently needing more assist at home. Family would like for pt to return home if possible. Will need to progress mobility further to ensure safety prior to d/c. Will continue to follow acutely.        Recommendations for follow up therapy are one component of a multi-disciplinary discharge planning process, led by the attending physician.  Recommendations may be updated based on patient status, additional functional criteria and insurance authorization.  Follow Up Recommendations Home health PT (Pending progression)      Assistance Recommended at Discharge Frequent or constant Supervision/Assistance  Patient can return home with the following  A lot of help with walking and/or transfers;A lot of help with bathing/dressing/bathroom;Assistance with cooking/housework;Direct supervision/assist for medications management;Help with stairs or ramp for entrance;Assist for transportation    Equipment Recommendations None recommended by PT  Recommendations for Other Services       Functional Status Assessment Patient has had a recent decline in their functional status and demonstrates the ability to make significant improvements in function in a reasonable and predictable amount of time.     Precautions / Restrictions Precautions Precautions: Fall Restrictions Weight Bearing Restrictions: No      Mobility  Bed Mobility Overal bed  mobility: Needs Assistance Bed Mobility: Supine to Sit, Sit to Supine     Supine to sit: Max assist, +2 for physical assistance, +2 for safety/equipment, HOB elevated Sit to supine: Max assist, +2 for physical assistance, +2 for safety/equipment   General bed mobility comments: Max cues/assist to initiate bring LEs to EOB with some resistance noted, hand held assist to lift trunk.    Transfers Overall transfer level: Needs assistance Equipment used: 2 person hand held assist Transfers: Sit to/from Stand Sit to Stand: Max assist, +2 physical assistance, +2 safety/equipment, From elevated surface           General transfer comment: blocking B feet to prevent sliding, posterior bias and B handheld assist to stand off of stretcher    Ambulation/Gait                  Stairs            Wheelchair Mobility    Modified Rankin (Stroke Patients Only)       Balance Overall balance assessment: Needs assistance, History of Falls Sitting-balance support: Feet supported, No upper extremity supported Sitting balance-Leahy Scale: Poor   Postural control: Posterior lean Standing balance support: Bilateral upper extremity supported, During functional activity Standing balance-Leahy Scale: Poor                               Pertinent Vitals/Pain Pain Assessment Pain Assessment: No/denies pain    Home Living Family/patient expects to be discharged to:: Private residence Living Arrangements: Children Available Help at Discharge: Family;Available 24 hours/day Type of Home: House Home Access: Stairs to enter Entrance Stairs-Rails: Chemical engineer of Steps: 4-5   Home Layout: One level Home  Equipment: Conservation officer, nature (2 wheels);BSC/3in1;Grab bars - toilet;Shower seat;Cane - single point;Tub bench;Wheelchair - manual;Other (comment) (call bell in room, bedrails on bed)      Prior Function Prior Level of Function : Needs assist              Mobility Comments: family encourages pt to use RW in home, recent falls but family able to assist pt back up ADLs Comments: Assist for shower transfers, bathing, dressing. Able to sometimes transfer to [facility] without assist     Hand Dominance   Dominant Hand: Right    Extremity/Trunk Assessment   Upper Extremity Assessment Upper Extremity Assessment: Defer to OT evaluation    Lower Extremity Assessment Lower Extremity Assessment: Generalized weakness    Cervical / Trunk Assessment Cervical / Trunk Assessment: Normal  Communication   Communication: Expressive difficulties  Cognition Arousal/Alertness: Awake/alert Behavior During Therapy: WFL for tasks assessed/performed, Restless Overall Cognitive Status: History of cognitive impairments - at baseline                                 General Comments: hx of dementia, pleasant. multimodal cues to follow directions. memory deficits noted        General Comments General comments (skin integrity, edema, etc.): DAughter and granddaughter present    Exercises     Assessment/Plan    PT Assessment Patient needs continued PT services  PT Problem List Decreased strength;Decreased balance;Decreased activity tolerance;Decreased mobility;Decreased cognition;Decreased knowledge of use of DME;Decreased safety awareness;Decreased knowledge of precautions       PT Treatment Interventions DME instruction;Gait training;Stair training;Functional mobility training;Therapeutic activities;Therapeutic exercise;Balance training;Patient/family education    PT Goals (Current goals can be found in the Care Plan section)  Acute Rehab PT Goals Patient Stated Goal: to take a nap PT Goal Formulation: With patient Time For Goal Achievement: 09/27/22 Potential to Achieve Goals: Good    Frequency Min 3X/week     Co-evaluation PT/OT/SLP Co-Evaluation/Treatment: Yes Reason for Co-Treatment: To address functional/ADL  transfers PT goals addressed during session: Balance;Mobility/safety with mobility         AM-PAC PT "6 Clicks" Mobility  Outcome Measure Help needed turning from your back to your side while in a flat bed without using bedrails?: A Lot Help needed moving from lying on your back to sitting on the side of a flat bed without using bedrails?: Total Help needed moving to and from a bed to a chair (including a wheelchair)?: Total Help needed standing up from a chair using your arms (e.g., wheelchair or bedside chair)?: Total Help needed to walk in hospital room?: Total Help needed climbing 3-5 steps with a railing? : Total 6 Click Score: 7    End of Session   Activity Tolerance: Patient tolerated treatment well Patient left: in bed;with call bell/phone within reach;with family/visitor present (on stretcher in ED) Nurse Communication: Mobility status PT Visit Diagnosis: Unsteadiness on feet (R26.81);Muscle weakness (generalized) (M62.81);Difficulty in walking, not elsewhere classified (R26.2)    Time: 0623-7628 PT Time Calculation (min) (ACUTE ONLY): 21 min   Charges:   PT Evaluation $PT Eval Moderate Complexity: 1 Mod          Reuel Derby, PT, DPT  Acute Rehabilitation Services  Office: [phone]   Rudean Hitt 09/13/2022, 7:46 PM

## 2022-09-13 NOTE — Plan of Care (Signed)
  Problem: Education: Goal: Ability to describe self-care measures that may prevent or decrease complications (Diabetes Survival Skills Education) will improve Outcome: Progressing Goal: Individualized Educational Video(s) Outcome: Progressing   Problem: Coping: Goal: Ability to adjust to condition or change in health will improve Outcome: Progressing   

## 2022-09-13 NOTE — Progress Notes (Signed)
EEG complete - results pending 

## 2022-09-13 NOTE — Procedures (Signed)
Patient Name: Diana Harper  MRN: [id number]  Epilepsy Attending: Lora Havens  Referring Physician/Provider: Bonnielee Haff, MD  Date: 09/13/2022 Duration: 21.35 mins  Patient history: 86yo F with ams. EEG to evaluate for seizure  Level of alertness: Awake  AEDs during EEG study: None  Technical aspects: This EEG study was done with scalp electrodes positioned according to the 10-20 International system of electrode placement. Electrical activity was reviewed with band pass filter of 1-'70Hz'$ , sensitivity of 7 uV/mm, display speed of 57m/sec with a '60Hz'$  notched filter applied as appropriate. EEG data were recorded continuously and digitally stored.  Video monitoring was available and reviewed as appropriate.  Description: The posterior dominant rhythm consists of 8 Hz activity of moderate voltage (25-35 uV) seen predominantly in posterior head regions, symmetric and reactive to eye opening and eye closing. EEG showed intermittent generalized 3 to 6 Hz theta-delta slowing. Hyperventilation and photic stimulation were not performed.     ABNORMALITY - Intermittent slow, generalized  IMPRESSION: This study is suggestive of mild diffuse encephalopathy, nonspecific etiology. No seizures or epileptiform discharges were seen throughout the recording.  Yitzel Shasteen OBarbra Sarks

## 2022-09-13 NOTE — Evaluation (Signed)
Occupational Therapy Evaluation Patient Details Name: Diana Harper MRN: [id number] DOB: Feb 06, 1936 Today's Date: 09/13/2022   History of Present Illness Pt is an 86 y/o female who presented with AMS with fixed gaze. MRI brain negative, EEG negative. PMH: prior CVA, type 2 diabetes, essential hypertension, hyperlipidemia, hypothyroidism, recent C. difficile colitis   Clinical Impression   PTA, pt lives with family, typically able to transfer with limited assist and receives assistance for most ADLs from family. Pt presents now with baseline cognitive deficits, as well as new deficits in strength and standing balance. Pt requires overall Max A x 2 for bed mobility and standing attempts with noted posterior bias. Pt requires Mod A for UB ADLs and up to Total A x 2 for LB ADLs if completed in standing. Pt's family present, endorses 24/7 support at home and would like for pt to return home at DC. Will continue to follow acutely to improve BSC transfers, decrease fall risk and provide family education for ADLs as needed.      Recommendations for follow up therapy are one component of a multi-disciplinary discharge planning process, led by the attending physician.  Recommendations may be updated based on patient status, additional functional criteria and insurance authorization.   Follow Up Recommendations  Home health OT    Assistance Recommended at Discharge Frequent or constant Supervision/Assistance  Patient can return home with the following A lot of help with walking and/or transfers;A lot of help with bathing/dressing/bathroom    Functional Status Assessment  Patient has had a recent decline in their functional status and demonstrates the ability to make significant improvements in function in a reasonable and predictable amount of time.  Equipment Recommendations  None recommended by OT    Recommendations for Other Services       Precautions / Restrictions Precautions Precautions:  Fall Restrictions Weight Bearing Restrictions: No      Mobility Bed Mobility Overal bed mobility: Needs Assistance Bed Mobility: Supine to Sit, Sit to Supine     Supine to sit: Max assist, +2 for physical assistance, +2 for safety/equipment, HOB elevated Sit to supine: Max assist, +2 for physical assistance, +2 for safety/equipment   General bed mobility comments: Max cues/assist to initiate bring LEs to EOB with some resistance noted, hand held assist to lift trunk.    Transfers Overall transfer level: Needs assistance Equipment used: 2 person hand held assist Transfers: Sit to/from Stand Sit to Stand: Max assist, +2 physical assistance, +2 safety/equipment, From elevated surface           General transfer comment: blocking B feet to prevent sliding, posterior bias and B handheld assist to stand off of stretcher      Balance Overall balance assessment: Needs assistance, History of Falls Sitting-balance support: Feet supported, No upper extremity supported Sitting balance-Leahy Scale: Poor   Postural control: Posterior lean Standing balance support: Bilateral upper extremity supported, During functional activity Standing balance-Leahy Scale: Poor                             ADL either performed or assessed with clinical judgement   ADL Overall ADL's : Needs assistance/impaired Eating/Feeding: Set up   Grooming: Minimal assistance;Sitting   Upper Body Bathing: Moderate assistance;Sitting   Lower Body Bathing: Maximal assistance;+2 for physical assistance;+2 for safety/equipment;Sit to/from stand   Upper Body Dressing : Moderate assistance;Sitting   Lower Body Dressing: Total assistance;+2 for physical assistance;+2 for safety/equipment;Sitting/lateral leans;Sit to/from stand  Toileting- Clothing Manipulation and Hygiene: Total assistance;+2 for physical assistance;+2 for safety/equipment;Sitting/lateral lean;Sit to/from stand          General ADL Comments: Posterior bias and narrow BOS with difficulty taking steps at stretcher side     Vision Baseline Vision/History: 1 Wears glasses Ability to See in Adequate Light: 0 Adequate Patient Visual Report: No change from baseline Vision Assessment?: No apparent visual deficits     Perception     Praxis      Pertinent Vitals/Pain Pain Assessment Pain Assessment: No/denies pain     Hand Dominance Right   Extremity/Trunk Assessment Upper Extremity Assessment Upper Extremity Assessment: Generalized weakness   Lower Extremity Assessment Lower Extremity Assessment: Defer to PT evaluation   Cervical / Trunk Assessment Cervical / Trunk Assessment: Normal   Communication Communication Communication: Expressive difficulties   Cognition Arousal/Alertness: Awake/alert Behavior During Therapy: WFL for tasks assessed/performed, Restless Overall Cognitive Status: History of cognitive impairments - at baseline                                 General Comments: hx of dementia, pleasant. multimodal cues to follow directions. memory deficits noted     General Comments  Daughter and granddaughter present    Exercises     Shoulder Instructions      Home Living Family/patient expects to be discharged to:: Private residence Living Arrangements: Children Available Help at Discharge: Family;Available 24 hours/day Type of Home: House Home Access: Stairs to enter [employer] of Steps: 4-5 Entrance Stairs-Rails: Left;Right Home Layout: One level     Bathroom Shower/Tub: Teacher, early years/pre: Standard     Home Equipment: Conservation officer, nature (2 wheels);BSC/3in1;Grab bars - toilet;Shower seat;Cane - single point;Tub bench;Wheelchair - manual;Other (comment) (call bell in room, bedrails on bed)          Prior Functioning/Environment Prior Level of Function : Needs assist             Mobility Comments: family encourages pt to  use RW in home, recent falls but family able to assist pt back up ADLs Comments: Assist for shower transfers, bathing, dressing. Able to sometimes transfer to [facility] without assist        OT Problem List: Decreased strength;Decreased activity tolerance;Impaired balance (sitting and/or standing);Decreased cognition;Decreased safety awareness;Decreased knowledge of use of DME or AE      OT Treatment/Interventions: Self-care/ADL training;Therapeutic exercise;Energy conservation;DME and/or AE instruction;Therapeutic activities;Patient/family education;Balance training    OT Goals(Current goals can be found in the care plan section) Acute Rehab OT Goals Patient Stated Goal: return home, improve ability to take steps/transfer OT Goal Formulation: With patient/family Time For Goal Achievement: 09/27/22 Potential to Achieve Goals: Fair ADL Goals Pt Will Perform Grooming: with set-up;sitting Pt Will Transfer to Toilet: with min assist;stand pivot transfer;bedside commode Pt Will Perform Toileting - Clothing Manipulation and hygiene: with min assist;sit to/from stand;sitting/lateral leans  OT Frequency: Min 2X/week    Co-evaluation PT/OT/SLP Co-Evaluation/Treatment: Yes Reason for Co-Treatment: To address functional/ADL transfers;For patient/therapist safety   OT goals addressed during session: ADL's and self-care;Strengthening/ROM      AM-PAC OT "6 Clicks" Daily Activity     Outcome Measure Help from another person eating meals?: A Little Help from another person taking care of personal grooming?: A Little Help from another person toileting, which includes using toliet, bedpan, or urinal?: Total Help from another person bathing (including washing, rinsing, drying)?: A Lot Help  from another person to put on and taking off regular upper body clothing?: A Lot Help from another person to put on and taking off regular lower body clothing?: Total 6 Click Score: 12   End of Session    Activity  Tolerance: Patient tolerated treatment well Patient left: in bed;with call bell/phone within reach;with family/visitor present  OT Visit Diagnosis: Unsteadiness on feet (R26.81);Other abnormalities of gait and mobility (R26.89)                Time: 3491-7915 OT Time Calculation (min): 17 min Charges:  OT General Charges $OT Visit: 1 Visit OT Evaluation $OT Eval Low Complexity: 1 Low  Malachy Chamber, OTR/L Acute Rehab Services Office: [place] 09/13/2022, 3:24 PM

## 2022-09-13 NOTE — Progress Notes (Addendum)
[facility] PROGRESS NOTE   Diana Harper VOJ:[id number] DOB: [birth year] DOA: 09/12/2022  PCP: [name], MD  Brief History/Interval Summary: 86 y.o. female with medical history significant for prior CVA, type 2 diabetes, essential hypertension, hyperlipidemia, hypothyroidism, recent C. difficile colitis, vitamin D deficiency, severe thiamine and B12 deficiencies who presented to [facility] ED as a code stroke from home.  The patient lives at home with her daughter.  She was assisting the patient to the bedside commode when all of a sudden could not follow commands.  She had an episode of starring and may have passed out.  EMS was activated.  Initially patient was not following commands.  She was seen by neurology.  Patient is a retired Warden/ranger.  Patient was hospitalized for further management.  Consultants: Neurology  Procedures: EEG    Subjective/Interval History: Somewhat distracted.  Denies any chest pain or shortness of breath.  She does admit to feeling quite fatigued.  Complains of back pain.    Assessment/Plan:  Unresponsiveness, possible syncope Could have been a vasovagal episode.  There was initially concern for acute stroke.  MRI brain does not show any acute stroke.  She does not appear to have any focal neurological deficits.  She is however significantly deconditioned from a physical standpoint.  PT and OT evaluation. Will order echocardiogram  Acute metabolic encephalopathy Likely multifactorial including thiamine and B12 deficiencies.  Folic acid deficiency. Started on supplementation. MRI did not show any stroke.  No evidence for any infectious etiology at this time.  Vitamin deficiencies including thiamine and X93 and folic acid Interestingly her vitamin B12 level was low on September 27 at 148.  B12 level during this admission noted to be greater than 7500. We will change her from parenteral administration to oral administration of B12.  Continue  with high-dose thiamine.  We will also start folic acid supplementation.  Back pain Neurology felt patient was having some myelopathic symptoms.  MRI cervical thoracic spine has been ordered.  May also need to consider doing MRI lumbar spine.  Recent C. difficile colitis Appears to have resolved.  Continue to monitor.  Normocytic anemia No evidence of overt blood loss.  Continue to monitor.  Chronic kidney disease stage IV/hypokalemia and hypomagnesemia Renal function close to baseline.  Monitor urine output.  Replace potassium and magnesium.  Diabetes mellitus type 2, uncontrolled with hyperglycemia HbA1c 7.1 in August.  SSI.  Essential hypertension Continue to monitor blood pressures.    Hypothyroidism Continue levothyroxine.  Vitamin D3 deficiency Continue vitamin D supplement patient's.  Chronic anxiety and depression Noted to be on sertraline   DVT Prophylaxis: Lovenox Code Status: Full code Family Communication: Discussed with patient.  No family at bedside Disposition Plan: Hopefully return home when improved.  Will need PT and OT evaluation.  Status is: Observation The patient will require care spanning > 2 midnights and should be moved to inpatient because: Significant physical deconditioning, neurological work-up in progress      Medications: Scheduled:  [START ON 09/15/2022] cloNIDine  0.2 mg Transdermal Q Wed   cyanocobalamin  1,000 mcg Intramuscular Daily   enoxaparin (LOVENOX) injection  30 mg Subcutaneous Q24H   insulin aspart  0-5 Units Subcutaneous QHS   insulin aspart  0-9 Units Subcutaneous TID WC   levothyroxine  88 mcg Oral QAC breakfast   LORazepam  1 mg Intravenous Once   multivitamin with minerals  1 tablet Oral Daily   sertraline  150 mg Oral QHS   [  START ON 09/22/2022] thiamine (VITAMIN B1) injection  100 mg Intravenous Daily   Continuous:  sodium chloride 50 mL/hr at 09/13/22 0246   thiamine (VITAMIN B1) injection Stopped (09/13/22  0709)   Followed by   Derrill Memo ON 09/16/2022] thiamine (VITAMIN B1) injection     YCX:[id number], hydrALAZINE, melatonin, ondansetron (ZOFRAN) IV, polyethylene glycol  Antibiotics: Anti-infectives (From admission, onward)    None       Objective:  Vital Signs  Vitals:   09/13/22 0615 09/13/22 0630 09/13/22 0641 09/13/22 0645  BP: (!) 175/79 (!) 163/137  (!) 166/90  Pulse: (!) 103 (!) 105  (!) 102  Resp: (!) 24 18  (!) 21  Temp:   99 F (37.2 C)   TempSrc:   Oral   SpO2: 99% 99%  99%  Weight:      Height:        Intake/Output Summary (Last 24 hours) at 09/13/2022 1006 Last data filed at 09/13/2022 0709 Gross per 24 hour  Intake 382.66 ml  Output --  Net 382.66 ml   Filed Weights   09/12/22 1800 09/12/22 1919  Weight: 63.6 kg 63.6 kg    General appearance: Awake alert.  In no distress.  Somewhat distracted. Resp: Clear to auscultation bilaterally.  Normal effort Cardio: S1-S2 is normal regular.  No S3-S4.  Systolic murmur appreciated over the precordium GI: Abdomen is soft.  Nontender nondistended.  Bowel sounds are present normal.  No masses organomegaly Extremities: Mild edema.  Significant physical deconditioning is noted. Neurologic:   No focal neurological deficits.    Lab Results:  Data Reviewed: I have personally reviewed following labs and reports of the imaging studies  CBC: Recent Labs  Lab 09/12/22 1847 09/12/22 1851  WBC 8.6  --   NEUTROABS 5.4  --   HGB 11.8* 11.9*  HCT 35.1* 35.0*  MCV 90.2  --   PLT 237  --     Basic Metabolic Panel: Recent Labs  Lab 09/12/22 1847 09/12/22 1851 09/13/22 0558  NA 139 139 139  K 3.7 3.7 3.4*  CL 103 103 106  CO2 25  --  24  GLUCOSE 284* 274* 141*  BUN '20 23 16  '$ CREATININE 1.80* 1.70* 1.27*  CALCIUM 9.3  --  8.9  MG  --   --  1.6*  PHOS  --   --  2.9    GFR: Estimated Creatinine Clearance: 31.4 mL/min (A) (by C-G formula based on SCr of 1.27 mg/dL (H)).  Liver Function Tests: Recent  Labs  Lab 09/12/22 1847  AST 17  ALT 16  ALKPHOS 53  BILITOT 0.3  PROT 6.1*  ALBUMIN 3.4*     Coagulation Profile: Recent Labs  Lab 09/12/22 1847  INR 1.1     CBG: Recent Labs  Lab 09/12/22 1846 09/12/22 2216  GLUCAP 278* 186*      Anemia Panel: Recent Labs    09/12/22 1847 09/13/22 0558  VITAMINB12  --  >7,500*  FOLATE 5.1*  --     Radiology Studies: MR BRAIN WO CONTRAST  Result Date: 09/13/2022 CLINICAL DATA:  Altered mental status EXAM: MRI HEAD WITHOUT CONTRAST TECHNIQUE: Multiplanar, multiecho pulse sequences of the brain and surrounding structures were obtained without intravenous contrast. COMPARISON:  10/10/2017 FINDINGS: Brain: No acute infarct, mass effect or extra-axial collection. No acute or chronic hemorrhage. There is multifocal hyperintense T2-weighted signal within the white matter. Generalized volume loss. The midline structures are normal. Vascular: Major flow voids are preserved. Skull and  upper cervical spine: Normal calvarium and skull base. Visualized upper cervical spine and soft tissues are normal. Sinuses/Orbits:No paranasal sinus fluid levels or advanced mucosal thickening. No mastoid or middle ear effusion. Normal orbits. IMPRESSION: 1. No acute intracranial abnormality. 2. Findings of chronic small vessel ischemia and volume loss. Electronically Signed   By: Ulyses Jarred M.D.   On: 09/13/2022 02:06   CT HEAD CODE STROKE WO CONTRAST  Result Date: 09/12/2022 CLINICAL DATA:  Code stroke.  Acute neurologic deficit EXAM: CT HEAD WITHOUT CONTRAST TECHNIQUE: Contiguous axial images were obtained from the base of the skull through the vertex without intravenous contrast. RADIATION DOSE REDUCTION: This exam was performed according to the departmental dose-optimization program which includes automated exposure control, adjustment of the mA and/or kV according to patient size and/or use of iterative reconstruction technique. COMPARISON:  None  Available. FINDINGS: Brain: There is no mass, hemorrhage or extra-axial collection. There is generalized atrophy without lobar predilection. There is hypoattenuation of the periventricular white matter, most commonly indicating chronic ischemic microangiopathy. Vascular: No abnormal hyperdensity of the major intracranial arteries or dural venous sinuses. No intracranial atherosclerosis. Skull: The visualized skull base, calvarium and extracranial soft tissues are normal. Sinuses/Orbits: No fluid levels or advanced mucosal thickening of the visualized paranasal sinuses. No mastoid or middle ear effusion. The orbits are normal. ASPECTS [facility] Stroke Program Early CT Score) - Ganglionic level infarction (caudate, lentiform nuclei, internal capsule, insula, M1-M3 cortex): 7 - Supraganglionic infarction (M4-M6 cortex): 3 Total score (0-10 with 10 being normal): 10 IMPRESSION: 1. No acute intracranial abnormality. 2. ASPECTS is 10 Electronically Signed   By: Ulyses Jarred M.D.   On: 09/12/2022 19:01       LOS: 0 days   [facility] Pager on [email or web address]  09/13/2022, 10:06 AM

## 2022-09-13 NOTE — Progress Notes (Addendum)
Neurology Progress Note  Brief HPI: 86 y.o. female with medical history significant for prior CVA, type 2 diabetes, essential hypertension, hyperlipidemia, hypothyroidism, recent C. difficile colitis, vitamin D deficiency, severe thiamine and B12 deficiencies who presented to [facility] ED as a code stroke from home.  The patient lives at home with her daughter.  She was assisting the patient to the bedside commode when all of a sudden could not follow commands.  She had an episode of starring and may have passed out.  EMS was activated.    S:// Patient laying in bed asleep in NAD. No family at bedside. She awakens easily, follows commands. Patient states she has been eating very little and has lost a lot of weight recently. When asked why she is eating so little she states she is not hungry and will have small bites. No new neurological events overnight    O:// Current vital signs: BP (!) 166/90   Pulse (!) 102   Temp 99 F (37.2 C) (Oral)   Resp (!) 21   Ht '5\' 6"'$  (1.676 m)   Wt 63.6 kg   SpO2 99%   BMI 22.63 kg/m  Vital signs in last 24 hours: Temp:  [98.2 F (36.8 C)-99 F (37.2 C)] 99 F (37.2 C) (10/16 0641) Pulse Rate:  [76-105] 102 (10/16 0645) Resp:  [17-24] 21 (10/16 0645) BP: (119-195)/(50-137) 166/90 (10/16 0645) SpO2:  [97 %-100 %] 99 % (10/16 0645) Weight:  [63.6 kg] 63.6 kg (10/15 1919)  GENERAL: asleep, Awaken to voice, alert in NAD HEENT: - Normocephalic and atraumatic, dry mm LUNGS - Clear to auscultation bilaterally with no wheezes CV - S1S2 RRR, no m/r/g, equal pulses bilaterally. ABDOMEN - Soft, nontender, nondistended with normoactive BS Ext: warm, well perfused, intact peripheral pulses, no edema  NEURO:  Mental Status: AA&Ox2, self and age. Unable to state place, current month or year. Delayed speech and movements Language: speech is delayed, clear.  Naming, repetition, fluency, and comprehension intact. Cranial Nerves: PERRL 51m/brisk. EOMI, visual fields  full, slight right facial asymmetry, facial sensation intact, hearing intact, tongue/uvula/soft palate midline, normal sternocleidomastoid and trapezius muscle strength. No evidence of tongue atrophy or fibrillations Motor: motor movements are delayed, can hold all 4 extremities up with no drift. Moves all 4 spontaneously. Mild upper extremity tremors bilaterally Tone: is normal and bulk is normal Sensation- decreased on left arm  Coordination: FTN intact bilaterally Gait- deferred   Medications  Current Facility-Administered Medications:    0.9 %  sodium chloride infusion, , Intravenous, Continuous, HKayleen Memos DO, Last Rate: 50 mL/hr at 09/13/22 0246, New Bag at 09/13/22 0246   acetaminophen (TYLENOL) tablet 650 mg, 650 mg, Oral, Q6H PRN, Hall, Carole N, DO   [START ON 09/15/2022] cloNIDine (CATAPRES - Dosed in mg/24 hr) patch 0.2 mg, 0.2 mg, Transdermal, Q Wed, Hall, Carole N, DO   cyanocobalamin (VITAMIN B12) injection 1,000 mcg, 1,000 mcg, Intramuscular, Daily, Bhagat, Srishti L, MD, 1,000 mcg at 09/13/22 0010   enoxaparin (LOVENOX) injection 30 mg, 30 mg, Subcutaneous, Q24H, Hall, Carole N, DO, 30 mg at 09/12/22 2219   hydrALAZINE (APRESOLINE) injection 5 mg, 5 mg, Intravenous, Q6H PRN, HN[place] Crane Carole N, DO, 5 mg at 09/13/22 0553   insulin aspart (novoLOG) injection 0-5 Units, 0-5 Units, Subcutaneous, QHS, Hall, Carole N, DO   insulin aspart (novoLOG) injection 0-9 Units, 0-9 Units, Subcutaneous, TID WC, Hall, Carole N, DO   levothyroxine (SYNTHROID) tablet 88 mcg, 88 mcg, Oral, QAC breakfast, HIrene Pap  N, DO   LORazepam (ATIVAN) injection 1 mg, 1 mg, Intravenous, Once, Kristopher Oppenheim, DO   melatonin tablet 5 mg, 5 mg, Oral, QHS PRN, Irene Pap N, DO   multivitamin with minerals tablet 1 tablet, 1 tablet, Oral, Daily, Bhagat, Srishti L, MD   ondansetron (ZOFRAN) injection 4 mg, 4 mg, Intravenous, Q6H PRN, Hall, Carole N, DO   polyethylene glycol (MIRALAX / GLYCOLAX) packet 17 g, 17  g, Oral, Daily PRN, Hall, Carole N, DO   sertraline (ZOLOFT) tablet 150 mg, 150 mg, Oral, QHS, Hall, Carole N, DO, 150 mg at 09/12/22 2217   thiamine (VITAMIN B1) 500 mg in normal saline (50 mL) IVPB, 500 mg, Intravenous, Q8H, Stopped at 09/13/22 0709 **FOLLOWED BY** [START ON 09/16/2022] thiamine (VITAMIN B1) 250 mg in sodium chloride 0.9 % 50 mL IVPB, 250 mg, Intravenous, Daily **FOLLOWED BY** [START ON 09/22/2022] thiamine (VITAMIN B1) injection 100 mg, 100 mg, Intravenous, Daily, Bhagat, Srishti L, MD  Current Outpatient Medications:    acetaminophen (TYLENOL) 500 MG tablet, Take 1,000 mg by mouth every 8 (eight) hours as needed for mild pain, headache or fever., Disp: , Rfl:    BAYER LOW DOSE 81 MG tablet, Take 81 mg by mouth daily., Disp: , Rfl:    buPROPion (WELLBUTRIN XL) 150 MG 24 hr tablet, Take 150 mg by mouth daily., Disp: , Rfl:    Calcium Carbonate (CALTRATE 600 PO), Take 600 mg by mouth daily., Disp: , Rfl:    Cholecalciferol (VITAMIN D3) 25 MCG (1000 UT) CAPS, Take 1,000 Units by mouth daily., Disp: , Rfl:    Cyanocobalamin (VITAMIN B-12) 1000 MCG SUBL, Place 1 tablet under the tongue daily., Disp: , Rfl:    Glucagon HCl 1 MG SOLR, Inject 1 mg into the skin as needed (for a blood sugar <67)., Disp: , Rfl:    insulin degludec (TRESIBA) 200 UNIT/ML FlexTouch Pen, Inject 30 Units into the skin See admin instructions. Inject 30 units into the skin daily before brunch, Disp: , Rfl:    levothyroxine (SYNTHROID) 88 MCG tablet, Take 88 mcg by mouth daily before breakfast., Disp: , Rfl:    nitroGLYCERIN (NITROSTAT) 0.4 MG SL tablet, Place 1 tablet (0.4 mg total) under the tongue every 5 (five) minutes as needed for chest pain., Disp: 30 tablet, Rfl: 3   NOVOLOG FLEXPEN 100 UNIT/ML FlexPen, Inject 5-10 Units into the skin See admin instructions. Inject 5 units into the skin before meals if BGL is 100-150, 7 units if BGL is 151-200, and 10 units if BGL is greater than 200, Disp: , Rfl:     sertraline (ZOLOFT) 50 MG tablet, Take 150 mg by mouth at bedtime., Disp: , Rfl:    TRELEGY ELLIPTA 200-62.5-25 MCG/ACT AEPB, Inhale 1 puff into the lungs daily as needed (for flares)., Disp: , Rfl:    Continuous Blood Gluc Sensor (FREESTYLE LIBRE 14 DAY SENSOR) MISC, Inject 1 Device into the skin every 14 (fourteen) days., Disp: , Rfl:  Labs CBC    Component Value Date/Time   WBC 8.6 09/12/2022 1847   RBC 3.89 09/12/2022 1847   HGB 11.9 (L) 09/12/2022 1851   HCT 35.0 (L) 09/12/2022 1851   PLT 237 09/12/2022 1847   MCV 90.2 09/12/2022 1847   MCH 30.3 09/12/2022 1847   MCHC 33.6 09/12/2022 1847   RDW 13.4 09/12/2022 1847   LYMPHSABS 2.5 09/12/2022 1847   MONOABS 0.6 09/12/2022 1847   EOSABS 0.1 09/12/2022 1847   BASOSABS 0.1 09/12/2022 1847  CMP     Component Value Date/Time   NA 139 09/13/2022 0558   NA 145 (H) 01/19/2021 1504   K 3.4 (L) 09/13/2022 0558   CL 106 09/13/2022 0558   CO2 24 09/13/2022 0558   GLUCOSE 141 (H) 09/13/2022 0558   BUN 16 09/13/2022 0558   BUN 26 01/19/2021 1504   CREATININE 1.27 (H) 09/13/2022 0558   CALCIUM 8.9 09/13/2022 0558   PROT 6.1 (L) 09/12/2022 1847   ALBUMIN 3.4 (L) 09/12/2022 1847   AST 17 09/12/2022 1847   ALT 16 09/12/2022 1847   ALKPHOS 53 09/12/2022 1847   BILITOT 0.3 09/12/2022 1847   GFRNONAA 42 (L) 09/13/2022 0558   GFRAA 33 (L) 01/19/2021 1504    glycosylated hemoglobin  Lipid Panel     Component Value Date/Time   CHOL 167 10/11/2017 0226   TRIG 198 (H) 10/11/2017 0226   HDL 31 (L) 10/11/2017 0226   CHOLHDL 5.4 10/11/2017 0226   VLDL 40 10/11/2017 0226   LDLCALC 96 10/11/2017 0226     Imaging I have reviewed images in epic and the results pertinent to this consultation are:  CT-scan of the brain- 1. No acute intracranial abnormality. 2. ASPECTS is 10  MRI examination of the brain 1. No acute intracranial abnormality. 2. Findings of chronic small vessel ischemia and volume loss  Labs:  thiamine, B12,  MMA,  B6, copper, zinc- pending  Folate- 5.1   Beulah Gandy DNP, ACNPC-AG  Assessment:  86 y.o. female with medical history significant for prior CVA, type 2 diabetes, essential hypertension, hyperlipidemia, hypothyroidism, recent C. difficile colitis, vitamin D deficiency, severe thiamine and B12 deficiencies who presented to [facility] ED as a code stroke due to syncope.  From description, she has had progressive encephalopathy, and some difficulty with walking very much consistent with her thiamine deficiency.  Given that she was not improving, and had some eye movement abnormalities after syncope, decision to admit for high-dose thiamine repletion was made.  With her report of losing weight, poor p.o. intake, I think first and foremost nutrition needs to be a primary consideration.  Recommendations: - Continue B12 supplement - Continue Thiamine 500 mg every 8 hours x3 days followed by '100mg'$  daily - continue MVI - Nutritional consult - Neurology will continue to follow   Roland Rack, MD [facility] [phone]  If 7pm- 7am, please page neurology on call as listed in [place].

## 2022-09-13 NOTE — ED Notes (Signed)
MRI informed this RN that pt was experiencing increased anxiety while being scanned. This RN paged Bridgett Larsson MD requesting an additional dose of ativan. MRI was called back and this RN was informed that pt had refused to finished scanning and would be transported back to the room. Bridgett Larsson MD and [place] Crane DO made aware

## 2022-09-13 NOTE — ED Notes (Signed)
Pt transported to MRI at this time 

## 2022-09-13 NOTE — ED Notes (Signed)
Pt medications crushed placed in appledsauce and k-dur dissolved in water. Pt tolerated medications well. Pt resting on stretcher. Pt denies any pain or any needs.

## 2022-09-13 NOTE — ED Notes (Signed)
Pt transported upstairs via stretcher at this time.

## 2022-09-13 NOTE — ED Notes (Signed)
Pt returned from MRI. Pt turned, linens and brief changed. Pt HOB elevated. Call bell in reach. Side rails  up x2. On monitoring.

## 2022-09-13 NOTE — Progress Notes (Signed)
Patient is alert and oriented to only self , could not answer any of admissions questions. Will try again when a family member visit or she get oriented enough to answer them.

## 2022-09-14 ENCOUNTER — Inpatient Hospital Stay (HOSPITAL_COMMUNITY): Payer: Medicare Other

## 2022-09-14 DIAGNOSIS — R55 Syncope and collapse: Secondary | ICD-10-CM | POA: Diagnosis not present

## 2022-09-14 DIAGNOSIS — E512 Wernicke's encephalopathy: Secondary | ICD-10-CM | POA: Diagnosis not present

## 2022-09-14 DIAGNOSIS — G9341 Metabolic encephalopathy: Secondary | ICD-10-CM | POA: Diagnosis not present

## 2022-09-14 DIAGNOSIS — E519 Thiamine deficiency, unspecified: Secondary | ICD-10-CM | POA: Diagnosis not present

## 2022-09-14 LAB — COMPREHENSIVE METABOLIC PANEL
ALT: 14 U/L (ref 0–44)
AST: 16 U/L (ref 15–41)
Albumin: 3.2 g/dL — ABNORMAL LOW (ref 3.5–5.0)
Alkaline Phosphatase: 53 U/L (ref 38–126)
Anion gap: 6 (ref 5–15)
BUN: 11 mg/dL (ref 8–23)
CO2: 26 mmol/L (ref 22–32)
Calcium: 9.1 mg/dL (ref 8.9–10.3)
Chloride: 106 mmol/L (ref 98–111)
Creatinine, Ser: 1.25 mg/dL — ABNORMAL HIGH (ref 0.44–1.00)
GFR, Estimated: 43 mL/min — ABNORMAL LOW (ref >60)
Glucose, Bld: 132 mg/dL — ABNORMAL HIGH (ref 70–99)
Potassium: 3.7 mmol/L (ref 3.5–5.1)
Sodium: 138 mmol/L (ref 135–145)
Total Bilirubin: 0.5 mg/dL (ref 0.3–1.2)
Total Protein: 6.2 g/dL — ABNORMAL LOW (ref 6.5–8.1)

## 2022-09-14 LAB — VITAMIN B12: Vitamin B-12: 6158 pg/mL — ABNORMAL HIGH (ref 180–914)

## 2022-09-14 LAB — IRON AND TIBC
Iron: 19 ug/dL — ABNORMAL LOW (ref 28–170)
Saturation Ratios: 8 % — ABNORMAL LOW (ref 10.4–31.8)
TIBC: 234 ug/dL — ABNORMAL LOW (ref 250–450)
UIBC: 215 ug/dL

## 2022-09-14 LAB — CBC
HCT: 34.6 % — ABNORMAL LOW (ref 36.0–46.0)
Hemoglobin: 11.3 g/dL — ABNORMAL LOW (ref 12.0–15.0)
MCH: 29 pg (ref 26.0–34.0)
MCHC: 32.7 g/dL (ref 30.0–36.0)
MCV: 88.7 fL (ref 80.0–100.0)
Platelets: 230 10*3/uL (ref 150–400)
RBC: 3.9 MIL/uL (ref 3.87–5.11)
RDW: 13.4 % (ref 11.5–15.5)
WBC: 7.7 10*3/uL (ref 4.0–10.5)
nRBC: 0 % (ref 0.0–0.2)

## 2022-09-14 LAB — FERRITIN: Ferritin: 135 ng/mL (ref 11–307)

## 2022-09-14 LAB — ECHOCARDIOGRAM COMPLETE
Area-P 1/2: 3.85 cm2
Height: 66 in
S' Lateral: 2.8 cm
Weight: 2243.4 oz

## 2022-09-14 LAB — GLUCOSE, CAPILLARY
Glucose-Capillary: 102 mg/dL — ABNORMAL HIGH (ref 70–99)
Glucose-Capillary: 99 mg/dL (ref 70–99)
Glucose-Capillary: 123 mg/dL — ABNORMAL HIGH (ref 70–99)
Glucose-Capillary: 105 mg/dL — ABNORMAL HIGH (ref 70–99)

## 2022-09-14 LAB — RETICULOCYTES
Immature Retic Fract: 5.7 % (ref 2.3–15.9)
RBC.: 3.85 MIL/uL — ABNORMAL LOW (ref 3.87–5.11)
Retic Count, Absolute: 46.2 10*3/uL (ref 19.0–186.0)
Retic Ct Pct: 1.2 % (ref 0.4–3.1)

## 2022-09-14 LAB — FOLATE: Folate: 5.3 ng/mL — ABNORMAL LOW (ref >5.9)

## 2022-09-14 MED ORDER — PERFLUTREN LIPID MICROSPHERE
1.0000 mL | INTRAVENOUS | Status: AC | PRN
  Administered 2022-09-14: 6 mL via INTRAVENOUS
  Filled 2022-09-14: qty 10

## 2022-09-14 MED ORDER — AMLODIPINE BESYLATE 5 MG PO TABS
5.0000 mg | ORAL_TABLET | Freq: Every day | ORAL | Status: DC
  Administered 2022-09-14 – 2022-09-16 (×3): 5 mg via ORAL
  Filled 2022-09-14 (×3): qty 1

## 2022-09-14 MED ORDER — CLONIDINE HCL 0.1 MG/24HR TD PTWK
0.1000 mg | MEDICATED_PATCH | TRANSDERMAL | Status: DC
  Administered 2022-09-14: 0.1 mg via TRANSDERMAL
  Filled 2022-09-14: qty 1

## 2022-09-14 NOTE — Progress Notes (Signed)
  Echocardiogram 2D Echocardiogram has been performed.  Diana Harper 09/14/2022, 10:24 AM

## 2022-09-14 NOTE — Plan of Care (Signed)
  Problem: Education: Goal: Ability to describe self-care measures that may prevent or decrease complications (Diabetes Survival Skills Education) will improve Outcome: Progressing Goal: Individualized Educational Video(s) Outcome: Progressing   Problem: Coping: Goal: Ability to adjust to condition or change in health will improve Outcome: Progressing   

## 2022-09-14 NOTE — Progress Notes (Signed)
Physical Therapy Treatment Patient Details Name: Diana Harper MRN: [id number] DOB: 01-22-1936 Today's Date: 09/14/2022   History of Present Illness Pt is an 86 y/o female who presented with AMS with fixed gaze. MRI brain negative, EEG negative. PMH: prior CVA, type 2 diabetes, essential hypertension, hyperlipidemia, hypothyroidism, recent C. difficile colitis    PT Comments    Patient progressing well towards PT goals. Session focused on transfers and gait initiation. Requires heavy Mod A to stand from EOB with cues for technique as pt favors posterior lean and fearful of falling. More alert today but demonstrates cognitive deficits relating to memory, problem solving, following multi step commands and poor awareness of safety/deficits. Tolerated short distance ambulation in room with Min A. Will likely be safe to d/c home with hands on assist from family and HHPT services. Will follow.    Recommendations for follow up therapy are one component of a multi-disciplinary discharge planning process, led by the attending physician.  Recommendations may be updated based on patient status, additional functional criteria and insurance authorization.  Follow Up Recommendations  Home health PT     Assistance Recommended at Discharge Frequent or constant Supervision/Assistance  Patient can return home with the following A lot of help with walking and/or transfers;A lot of help with bathing/dressing/bathroom;Assistance with cooking/housework;Direct supervision/assist for medications management;Help with stairs or ramp for entrance;Assist for transportation   Equipment Recommendations  None recommended by PT    Recommendations for Other Services       Precautions / Restrictions Precautions Precautions: Fall Restrictions Weight Bearing Restrictions: No     Mobility  Bed Mobility Overal bed mobility: Needs Assistance Bed Mobility: Supine to Sit     Supine to sit: HOB elevated, Mod  assist     General bed mobility comments: Increased time, posterior bias, Min A due to initial posterior lean coming to EOB, cues for technique. Assist with scooting bottom to EOB.    Transfers Overall transfer level: Needs assistance Equipment used: Standard walker Transfers: Sit to/from Stand, Bed to chair/wheelchair/BSC Sit to Stand: Mod assist, From elevated surface   Step pivot transfers: Min assist       General transfer comment: Mod A to power to standing with manual/verbal cues for hand placement/technique, posterior lean upon transitioning. Stood from [employer], transferred to chair.    Ambulation/Gait Ambulation/Gait assistance: Min assist Gait Distance (Feet): 5 Feet Assistive device: Standard walker Gait Pattern/deviations: Narrow base of support, Decreased step length - right, Decreased step length - left Gait velocity: decreased     General Gait Details: Slow, mildly unsteady gait with assist for directional cues and SW management due to lines.   Stairs             Wheelchair Mobility    Modified Rankin (Stroke Patients Only)       Balance Overall balance assessment: Needs assistance, History of Falls Sitting-balance support: Feet supported, No upper extremity supported Sitting balance-Leahy Scale: Fair Sitting balance - Comments: Able to sit statically without UE support when feet are supported, initially needing Min A due to posterior lean, pt fearful of falling.   Standing balance support: During functional activity, Reliant on assistive device for balance Standing balance-Leahy Scale: Poor Standing balance comment: Requires UE support and external support for standing.                            Cognition Arousal/Alertness: Awake/alert Behavior During Therapy: WFL for tasks assessed/performed Overall  Cognitive Status: History of cognitive impairments - at baseline                                 General Comments: hx  of dementia, pleasant. multimodal cues to follow directions. memory deficits noted. Pt oriented x3 with increased time, "give me a sec"        Exercises      General Comments General comments (skin integrity, edema, etc.): VSS on RA.      Pertinent Vitals/Pain Pain Assessment Pain Assessment: No/denies pain    Home Living                          Prior Function            PT Goals (current goals can now be found in the care plan section) Progress towards PT goals: Progressing toward goals    Frequency    Min 3X/week      PT Plan Current plan remains appropriate    Co-evaluation              AM-PAC PT "6 Clicks" Mobility   Outcome Measure  Help needed turning from your back to your side while in a flat bed without using bedrails?: A Little Help needed moving from lying on your back to sitting on the side of a flat bed without using bedrails?: A Lot Help needed moving to and from a bed to a chair (including a wheelchair)?: A Lot Help needed standing up from a chair using your arms (e.g., wheelchair or bedside chair)?: A Lot Help needed to walk in hospital room?: Total Help needed climbing 3-5 steps with a railing? : A Lot 6 Click Score: 12    End of Session Equipment Utilized During Treatment: Gait belt Activity Tolerance: Patient tolerated treatment well Patient left: in chair;with call bell/phone within reach;with chair alarm set Nurse Communication: Mobility status;Other (comment) (pt is chair and likely going to get up without assist.) PT Visit Diagnosis: Unsteadiness on feet (R26.81);Muscle weakness (generalized) (M62.81);Difficulty in walking, not elsewhere classified (R26.2)     Time: 7510-2585 PT Time Calculation (min) (ACUTE ONLY): 22 min  Charges:  $Therapeutic Activity: 8-22 mins                     Marisa Severin, PT, DPT Acute Rehabilitation Services Secure chat preferred Office [phone]      Diana Harper 09/14/2022, 12:55 PM

## 2022-09-14 NOTE — Progress Notes (Signed)
[facility] PROGRESS NOTE   Diana Harper HUD:[id number] DOB: [birth year] DOA: 09/12/2022  PCP: [name], MD  Brief History/Interval Summary: 86 y.o. female with medical history significant for prior CVA, type 2 diabetes, essential hypertension, hyperlipidemia, hypothyroidism, recent C. difficile colitis, vitamin D deficiency, severe thiamine and B12 deficiencies who presented to [facility] ED as a code stroke from home.  The patient lives at home with her daughter.  She was assisting the patient to the bedside commode when all of a sudden could not follow commands.  She had an episode of starring and may have passed out.  EMS was activated.  Initially patient was not following commands.  She was seen by neurology.  Patient is a retired Warden/ranger.  Patient was hospitalized for further management.  Consultants: Neurology  Procedures:  EEG is negative for epileptiform activity Transthoracic echocardiogram is pending    Subjective/Interval History: Patient noted to be is somewhat more alert this morning.  Answering questions more appropriately today compared to yesterday.  She does not remember how she came to the hospital.  Denies any lower back pain this morning.  No chest pain or shortness of breath.    Assessment/Plan:  Unresponsiveness, possible syncope Could have been a vasovagal episode.  There was initially concern for acute stroke.  MRI brain does not show any acute stroke.  She does not appear to have any focal neurological deficits.  She is however significantly deconditioned from a physical standpoint.  PT and OT evaluation. EEG was negative for seizure activity.  Echocardiogram is pending.  Acute metabolic encephalopathy Likely multifactorial including thiamine and B12 deficiencies.  Folic acid deficiency. Started on supplementation. MRI did not show any stroke.  No evidence for any infectious etiology at this time.  Vitamin deficiencies including thiamine and  C58 and folic acid Interestingly her vitamin B12 level was low on September 27 at 148.  B12 level during this admission noted to be greater than 7500. She was changed over from parenteral B12 to oral B12. Continue folic acid supplementation and thiamine, high-dose.  Back pain Neurology felt patient was having some myelopathic symptoms.  Neurological exam is stable.  She denies any lower back pain.  MRI of the cervical and thoracic spine does show disease but thought to be stable.  No further testing recommended by neurology regarding this issue at this time.  We will hold off on further testing.   PT and OT to continue to work with patient.    Essential hypertension Blood pressures is poorly controlled.  Home medication list reviewed.  No antihypertensives noted on this list.  However previously she used to be on clonidine.  But that this medication was removed from her prior to admission list for unclear reason.  Blood pressures are noted to be high and could be reflective of rebound hypertension.  We will put her back on clonidine patch. Amlodipine has also been ordered but may need to be discontinued if there is a drop in her blood pressure.  Recent C. difficile colitis Appears to have resolved.  Continue to monitor.  Normocytic anemia No evidence of overt blood loss.  Continue to monitor. Anemia panel shows ferritin of 135, iron 19, TIBC 234, percent saturation 8.  Folic acid 5.3.  B12 6158.  See discussion above as well.  Chronic kidney disease stage IV/hypokalemia and hypomagnesemia Renal function close to baseline.  Monitor urine output.  Replace potassium and magnesium.  Diabetes mellitus type 2, uncontrolled with hyperglycemia HbA1c  7.1 in August.  SSI.  CBGs are reasonably well controlled.  Hypothyroidism Continue levothyroxine.  Vitamin D3 deficiency Continue vitamin D supplement patient's.  Chronic anxiety and depression Noted to be on sertraline   DVT Prophylaxis:  Lovenox Code Status: Full code Family Communication: Discussed with patient.  We will call her daughter later today Disposition Plan: Hopefully return home when improved.  Will need PT and OT evaluation.  Status is: Inpatient Remains inpatient appropriate because: Syncope, physical deconditioning, nutritional deficiencies      Medications: Scheduled:  amLODipine  5 mg Oral Daily   vitamin B-12  500 mcg Oral Daily   enoxaparin (LOVENOX) injection  40 mg Subcutaneous T03T   folic acid  1 mg Oral Daily   insulin aspart  0-5 Units Subcutaneous QHS   insulin aspart  0-9 Units Subcutaneous TID WC   levothyroxine  88 mcg Oral QAC breakfast   LORazepam  1 mg Intravenous Once   multivitamin with minerals  1 tablet Oral Daily   sertraline  150 mg Oral QHS   [START ON 09/22/2022] thiamine (VITAMIN B1) injection  100 mg Intravenous Daily   Continuous:  thiamine (VITAMIN B1) injection 500 mg (09/14/22 0847)   Followed by   Derrill Memo ON 09/16/2022] thiamine (VITAMIN B1) injection     WSF:KCLEXNTZGYFVC, hydrALAZINE, melatonin, ondansetron (ZOFRAN) IV, perflutren lipid microspheres (DEFINITY) IV suspension, polyethylene glycol  Antibiotics: Anti-infectives (From admission, onward)    None       Objective:  Vital Signs  Vitals:   09/13/22 2038 09/14/22 0300 09/14/22 0752 09/14/22 0800  BP: (!) 112/41 (!) 168/83  (!) 149/81  Pulse: 88 82 81   Resp: 20 20    Temp: 98.3 F (36.8 C) 97.8 F (36.6 C) 97.9 F (36.6 C)   TempSrc: Oral Oral Oral   SpO2: 99% 100% 100%   Weight:      Height:        Intake/Output Summary (Last 24 hours) at 09/14/2022 1029 Last data filed at 09/13/2022 1334 Gross per 24 hour  Intake 292.8 ml  Output --  Net 292.8 ml    Filed Weights   09/12/22 1800 09/12/22 1919  Weight: 63.6 kg 63.6 kg    General appearance: Awake alert.  In no distress.  Mildly distracted Resp: Clear to auscultation bilaterally.  Normal effort Cardio: S1-S2 is normal  regular.  No S3-S4.  No rubs murmurs or bruit GI: Abdomen is soft.  Nontender nondistended.  Bowel sounds are present normal.  No masses organomegaly Extremities: Able to lift both legs off the bed but physical deconditioning is noted. Neurologic: Oriented to place year.  Did not know the month.  No obvious focal neurological deficits.    Lab Results:  Data Reviewed: I have personally reviewed following labs and reports of the imaging studies  CBC: Recent Labs  Lab 09/12/22 1847 09/12/22 1851 09/14/22 0253  WBC 8.6  --  7.7  NEUTROABS 5.4  --   --   HGB 11.8* 11.9* 11.3*  HCT 35.1* 35.0* 34.6*  MCV 90.2  --  88.7  PLT 237  --  230     Basic Metabolic Panel: Recent Labs  Lab 09/12/22 1847 09/12/22 1851 09/13/22 0558 09/14/22 0253  NA 139 139 139 138  K 3.7 3.7 3.4* 3.7  CL 103 103 106 106  CO2 25  --  24 26  GLUCOSE 284* 274* 141* 132*  BUN '20 23 16 11  '$ CREATININE 1.80* 1.70* 1.27* 1.25*  CALCIUM  9.3  --  8.9 9.1  MG  --   --  1.6*  --   PHOS  --   --  2.9  --      GFR: Estimated Creatinine Clearance: 31.9 mL/min (A) (by C-G formula based on SCr of 1.25 mg/dL (H)).  Liver Function Tests: Recent Labs  Lab 09/12/22 1847 09/14/22 0253  AST 17 16  ALT 16 14  ALKPHOS 53 53  BILITOT 0.3 0.5  PROT 6.1* 6.2*  ALBUMIN 3.4* 3.2*      Coagulation Profile: Recent Labs  Lab 09/12/22 1847  INR 1.1      CBG: Recent Labs  Lab 2022-09-29 1021 September 29, 2022 1357 2022/09/29 1740 09-29-2022 2040 09/14/22 0749  GLUCAP 135* 118* 99 169* 102*       Anemia Panel: Recent Labs    09/12/22 1847 29-Sep-2022 0558 09/14/22 0253  VITAMINB12  --  >7,500* 6,158*  FOLATE 5.1*  --  5.3*  FERRITIN  --   --  135  TIBC  --   --  234*  IRON  --   --  19*  RETICCTPCT  --   --  1.2     Radiology Studies: EEG adult  Result Date: 09/29/22 Lora Havens, MD     September 29, 2022  3:14 PM Patient Name: Penelope Fittro MRN: [id number] Epilepsy Attending: Lora Havens  Referring Physician/Provider: Bonnielee Haff, MD Date: 09-29-2022 Duration: 21.35 mins Patient history: 86yo F with ams. EEG to evaluate for seizure Level of alertness: Awake AEDs during EEG study: None Technical aspects: This EEG study was done with scalp electrodes positioned according to the 10-20 International system of electrode placement. Electrical activity was reviewed with band pass filter of 1-'70Hz'$ , sensitivity of 7 uV/mm, display speed of 92m/sec with a '60Hz'$  notched filter applied as appropriate. EEG data were recorded continuously and digitally stored.  Video monitoring was available and reviewed as appropriate. Description: The posterior dominant rhythm consists of 8 Hz activity of moderate voltage (25-35 uV) seen predominantly in posterior head regions, symmetric and reactive to eye opening and eye closing. EEG showed intermittent generalized 3 to 6 Hz theta-delta slowing. Hyperventilation and photic stimulation were not performed.   ABNORMALITY - Intermittent slow, generalized IMPRESSION: This study is suggestive of mild diffuse encephalopathy, nonspecific etiology. No seizures or epileptiform discharges were seen throughout the recording. PLora Havens  MR THORACIC SPINE WO CONTRAST  Result Date: 101-Nov-2023CLINICAL DATA:  86year old female with altered mental status. Pain, myelopathy after fall. EXAM: MRI THORACIC SPINE WITHOUT CONTRAST TECHNIQUE: Multiplanar, multisequence MR imaging of the thoracic spine was performed. No intravenous contrast was administered. COMPARISON:  Cervical spine MRI today.  Lumbar MRI 01/14/2009. FINDINGS: Study is moderately degraded by motion artifact despite repeated imaging attempts, with the patient crying and asking to halt imaging. Subsequently no diagnostic axial thoracic spine imaging could be obtained. Limited cervical spine imaging:  Reported separately today. Thoracic spine segmentation:  Appears to be normal. Alignment: Mildly exaggerated thoracic  kyphosis with a mild degree of thoracic scoliosis. No significant thoracic spondylolisthesis. Vertebrae: Limited detail. Widespread degenerative endplate spurring. No marrow edema identified. Cord: Grossly normal. Fairly normal thoracic spinal canal patency despite widespread thoracic disc and endplate degeneration. Conus medullaris appears stable since 2010 and within normal limits at T12-L1. Paraspinal and other soft tissues: Limited. Disc levels: Limited, no diagnostic axial imaging could be obtained. Widespread thoracic disc bulging and endplate spurring, but relatively maintained normal thoracic spinal canal patency. No spinal stenosis suspected.  IMPRESSION: 1. Motion degraded and truncated thoracic exam with no diagnostic quality axial imaging. 2. But no strong evidence of thoracic spinal stenosis or thoracic spinal cord abnormality, despite widespread thoracic disc and endplate degeneration. Electronically Signed   By: Genevie Ann M.D.   On: 09/13/2022 10:20   MR CERVICAL SPINE WO CONTRAST  Result Date: 09/13/2022 CLINICAL DATA:  86 year old female with altered mental status. Pain, myelopathy after fall. EXAM: MRI CERVICAL SPINE WITHOUT CONTRAST TECHNIQUE: Multiplanar, multisequence MR imaging of the cervical spine was performed. No intravenous contrast was administered. COMPARISON:  Brain MRI 0125 hours today. Previous cervical spine MRI 09/24/2008. FINDINGS: Study is moderately degraded by motion artifact despite repeated imaging attempts, with the patient crying and asking to halt imaging. Alignment: Reversal of cervical lordosis. Vertebrae: Widespread degenerative endplate spurring in the cervical spine. No marrow edema identified. Normal background bone marrow signal. Cord: At least moderate spinal stenosis and cord mass effect at C3 and C4. Limited spinal cord detail. Cannot exclude cord edema or myelomalacia on series 9, image 11 which is approximately the C4 level. Capacious spinal canal below C6.  Posterior Fossa, vertebral arteries, paraspinal tissues: Cervicomedullary junction is within normal limits. Negative visible posterior fossa. Major vascular flow voids in the neck appear to be preserved with dominant left vertebral artery. Negative visible lung apices. Disc levels: Detail is degraded by motion. Bulky disc and/or endplate degeneration H4-T6, C4-C5 and C5-C6. And the C4-C5 level was worst on the 2009 MRI. Mild to moderate spinal stenosis and spinal cord mass effect at both C3-C4 and C4-C5. Mild spinal stenosis at C5-C6. High-grade neural foraminal stenosis throughout those levels. No cervical spinal stenosis below C6. IMPRESSION: 1. Moderately degraded exam. 2. Chronic bulky cervical disc and endplate degeneration C3 through C6 as demonstrated on a 2009 MRI. Subsequent spinal stenosis and spinal cord mass effect likely progressed since that time. Limited cord detail today, cannot exclude myelomalacia or edema at approximately the C4 spinal. 3. High-grade bilateral cervical neural foraminal stenosis suspected at those levels. Electronically Signed   By: Genevie Ann M.D.   On: 09/13/2022 10:16   MR BRAIN WO CONTRAST  Result Date: 09/13/2022 CLINICAL DATA:  Altered mental status EXAM: MRI HEAD WITHOUT CONTRAST TECHNIQUE: Multiplanar, multiecho pulse sequences of the brain and surrounding structures were obtained without intravenous contrast. COMPARISON:  10/10/2017 FINDINGS: Brain: No acute infarct, mass effect or extra-axial collection. No acute or chronic hemorrhage. There is multifocal hyperintense T2-weighted signal within the white matter. Generalized volume loss. The midline structures are normal. Vascular: Major flow voids are preserved. Skull and upper cervical spine: Normal calvarium and skull base. Visualized upper cervical spine and soft tissues are normal. Sinuses/Orbits:No paranasal sinus fluid levels or advanced mucosal thickening. No mastoid or middle ear effusion. Normal orbits.  IMPRESSION: 1. No acute intracranial abnormality. 2. Findings of chronic small vessel ischemia and volume loss. Electronically Signed   By: Ulyses Jarred M.D.   On: 09/13/2022 02:06   CT HEAD CODE STROKE WO CONTRAST  Result Date: 09/12/2022 CLINICAL DATA:  Code stroke.  Acute neurologic deficit EXAM: CT HEAD WITHOUT CONTRAST TECHNIQUE: Contiguous axial images were obtained from the base of the skull through the vertex without intravenous contrast. RADIATION DOSE REDUCTION: This exam was performed according to the departmental dose-optimization program which includes automated exposure control, adjustment of the mA and/or kV according to patient size and/or use of iterative reconstruction technique. COMPARISON:  None Available. FINDINGS: Brain: There is no mass, hemorrhage or extra-axial collection. There  is generalized atrophy without lobar predilection. There is hypoattenuation of the periventricular white matter, most commonly indicating chronic ischemic microangiopathy. Vascular: No abnormal hyperdensity of the major intracranial arteries or dural venous sinuses. No intracranial atherosclerosis. Skull: The visualized skull base, calvarium and extracranial soft tissues are normal. Sinuses/Orbits: No fluid levels or advanced mucosal thickening of the visualized paranasal sinuses. No mastoid or middle ear effusion. The orbits are normal. ASPECTS [place] Stroke Program Early CT Score) - Ganglionic level infarction (caudate, lentiform nuclei, internal capsule, insula, M1-M3 cortex): 7 - Supraganglionic infarction (M4-M6 cortex): 3 Total score (0-10 with 10 being normal): 10 IMPRESSION: 1. No acute intracranial abnormality. 2. ASPECTS is 10 Electronically Signed   By: Ulyses Jarred M.D.   On: 09/12/2022 19:01       LOS: 1 day   [facility] Pager on [email or web address]  09/14/2022, 10:29 AM

## 2022-09-14 NOTE — Progress Notes (Signed)
Neurology Progress Note  Brief HPI: 86 y.o. female with medical history significant for prior CVA, type 2 diabetes, essential hypertension, hyperlipidemia, hypothyroidism, recent C. difficile colitis, vitamin D deficiency, severe thiamine and B12 deficiencies who presented to [facility] ED as a code stroke from home.  The patient lives at home with her daughter.  She was assisting the patient to the bedside commode when all of a sudden could not follow commands.  She had an episode of starring and may have passed out.  EMS was activated.    S:// Patient laying in bed awake in NAD. No family at bedside. She is alert and oriented to self and age, follows commands.  No new neurological events overnight VSS   O:// Current vital signs: BP (!) 168/83 (BP Location: Right Arm)   Pulse 81   Temp 97.9 F (36.6 C) (Oral)   Resp 20   Ht '5\' 6"'$  (1.676 m)   Wt 63.6 kg   SpO2 100%   BMI 22.63 kg/m  Vital signs in last 24 hours: Temp:  [97.8 F (36.6 C)-98.9 F (37.2 C)] 97.9 F (36.6 C) (10/17 0752) Pulse Rate:  [70-102] 81 (10/17 0752) Resp:  [15-26] 20 (10/17 0300) BP: (112-180)/(41-124) 168/83 (10/17 0300) SpO2:  [97 %-100 %] 100 % (10/17 0752)  GENERAL:  frail elderly woman in NAD HEENT: - Normocephalic and atraumatic, dry mm LUNGS - Clear to auscultation bilaterally with no wheezes CV - S1S2 RRR, no m/r/g, equal pulses bilaterally. ABDOMEN - Soft, nontender, nondistended with normoactive BS Ext: warm, well perfused, intact peripheral pulses, no edema  NEURO:  Mental Status: AA&Ox2, self and age. Unable to state place, current month or year. Delayed speech and movements Language: speech is delayed, clear.  Naming, repetition, fluency, and comprehension intact. Cranial Nerves: PERRL 11m/brisk. EOMI, visual fields full, slight right facial asymmetry, facial sensation intact, hearing intact, tongue/uvula/soft palate midline, normal sternocleidomastoid and trapezius muscle strength. No evidence of  tongue atrophy or fibrillations Motor: motor movements are delayed, can hold all 4 extremities up with no drift. Moves all 4 spontaneously. Mild upper extremity tremors bilaterally Tone: is normal and bulk is normal Sensation- decreased on left arm  Coordination: FTN intact bilaterally Gait- deferred   Medications  Current Facility-Administered Medications:    acetaminophen (TYLENOL) tablet 650 mg, 650 mg, Oral, Q6H PRN, HNevada Crane Carole N, DO   cyanocobalamin (VITAMIN B12) tablet 500 mcg, 500 mcg, Oral, Daily, KBonnielee Haff MD   enoxaparin (LOVENOX) injection 40 mg, 40 mg, Subcutaneous, Q24H, KBonnielee Haff MD, 40 mg at 144/01/0227253  folic acid (FOLVITE) tablet 1 mg, 1 mg, Oral, Daily, KBonnielee Haff MD, 1 mg at 09/13/22 1048   hydrALAZINE (APRESOLINE) injection 5 mg, 5 mg, Intravenous, Q6H PRN, HIrene PapN, DO, 5 mg at 09/13/22 0553   insulin aspart (novoLOG) injection 0-5 Units, 0-5 Units, Subcutaneous, QHS, Hall, Carole N, DO   insulin aspart (novoLOG) injection 0-9 Units, 0-9 Units, Subcutaneous, TID WC, Hall, Carole N, DO, 1 Units at 09/13/22 1030   levothyroxine (SYNTHROID) tablet 88 mcg, 88 mcg, Oral, QAC breakfast, Hall, Carole N, DO, 88 mcg at 09/13/22 1040   LORazepam (ATIVAN) injection 1 mg, 1 mg, Intravenous, Once, CKristopher Oppenheim DO   melatonin tablet 5 mg, 5 mg, Oral, QHS PRN, HNevada Crane Carole N, DO   multivitamin with minerals tablet 1 tablet, 1 tablet, Oral, Daily, Bhagat, Srishti L, MD, 1 tablet at 09/13/22 1048   ondansetron (ZOFRAN) injection 4 mg, 4 mg, Intravenous, Q6H  PRN, Irene Pap N, DO   polyethylene glycol (MIRALAX / GLYCOLAX) packet 17 g, 17 g, Oral, Daily PRN, Irene Pap N, DO   sertraline (ZOLOFT) tablet 150 mg, 150 mg, Oral, QHS, Hall, Carole N, DO, 150 mg at 09/13/22 2224   thiamine (VITAMIN B1) 500 mg in normal saline (50 mL) IVPB, 500 mg, Intravenous, Q8H, Last Rate: 100 mL/hr at 09/13/22 2242, 500 mg at 09/13/22 2242 **FOLLOWED BY** [START ON  09/16/2022] thiamine (VITAMIN B1) 250 mg in sodium chloride 0.9 % 50 mL IVPB, 250 mg, Intravenous, Daily **FOLLOWED BY** [START ON 09/22/2022] thiamine (VITAMIN B1) injection 100 mg, 100 mg, Intravenous, Daily, Bhagat, Srishti L, MD Labs CBC    Component Value Date/Time   WBC 7.7 09/14/2022 0253   RBC 3.90 09/14/2022 0253   HGB 11.3 (L) 09/14/2022 0253   HCT 34.6 (L) 09/14/2022 0253   PLT 230 09/14/2022 0253   MCV 88.7 09/14/2022 0253   MCH 29.0 09/14/2022 0253   MCHC 32.7 09/14/2022 0253   RDW 13.4 09/14/2022 0253   LYMPHSABS 2.5 09/12/2022 1847   MONOABS 0.6 09/12/2022 1847   EOSABS 0.1 09/12/2022 1847   BASOSABS 0.1 09/12/2022 1847    CMP     Component Value Date/Time   NA 138 09/14/2022 0253   NA 145 (H) 01/19/2021 1504   K 3.7 09/14/2022 0253   CL 106 09/14/2022 0253   CO2 26 09/14/2022 0253   GLUCOSE 132 (H) 09/14/2022 0253   BUN 11 09/14/2022 0253   BUN 26 01/19/2021 1504   CREATININE 1.25 (H) 09/14/2022 0253   CALCIUM 9.1 09/14/2022 0253   PROT 6.2 (L) 09/14/2022 0253   ALBUMIN 3.2 (L) 09/14/2022 0253   AST 16 09/14/2022 0253   ALT 14 09/14/2022 0253   ALKPHOS 53 09/14/2022 0253   BILITOT 0.5 09/14/2022 0253   GFRNONAA 43 (L) 09/14/2022 0253   GFRAA 33 (L) 01/19/2021 1504    glycosylated hemoglobin  Lipid Panel     Component Value Date/Time   CHOL 167 10/11/2017 0226   TRIG 198 (H) 10/11/2017 0226   HDL 31 (L) 10/11/2017 0226   CHOLHDL 5.4 10/11/2017 0226   VLDL 40 10/11/2017 0226   LDLCALC 96 10/11/2017 0226     Imaging I have reviewed images in epic and the results pertinent to this consultation are:  CT-scan of the brain- 1. No acute intracranial abnormality. 2. ASPECTS is 10  MRI examination of the brain 1. No acute intracranial abnormality. 2. Findings of chronic small vessel ischemia and volume loss  Labs:  thiamine,  MMA,  B6, copper, zinc- pending  Folate- 5.1 B12 6,158  EEG 10/16: This study is suggestive of mild diffuse  encephalopathy, nonspecific etiology. No seizures or epileptiform discharges were seen throughout the recording  Assessment:  86 y.o. female with medical history significant for prior CVA, type 2 diabetes, essential hypertension, hyperlipidemia, hypothyroidism, recent C. difficile colitis, vitamin D deficiency, severe thiamine and B12 deficiencies who presented to [facility] ED as a code stroke due to syncope.  From description, she has had progressive encephalopathy, and some difficulty with walking very much consistent with her thiamine deficiency.  Given that she was not improving, and had some eye movement abnormalities after syncope, decision to admit for high-dose thiamine repletion was made.  With her report of losing weight, poor p.o. intake, I think first and foremost nutrition needs to be a primary consideration.  Recommendations: - Continue Thiamine and MVI - Encourage PO intake - Neurology will  sign off. Please call with questions or concerns  Beulah Gandy DNP, ACNPC-AG

## 2022-09-15 ENCOUNTER — Other Ambulatory Visit (HOSPITAL_COMMUNITY): Payer: Self-pay

## 2022-09-15 ENCOUNTER — Inpatient Hospital Stay (HOSPITAL_COMMUNITY): Payer: Medicare Other

## 2022-09-15 DIAGNOSIS — E519 Thiamine deficiency, unspecified: Secondary | ICD-10-CM | POA: Insufficient documentation

## 2022-09-15 DIAGNOSIS — E538 Deficiency of other specified B group vitamins: Secondary | ICD-10-CM | POA: Insufficient documentation

## 2022-09-15 DIAGNOSIS — N184 Chronic kidney disease, stage 4 (severe): Secondary | ICD-10-CM | POA: Insufficient documentation

## 2022-09-15 DIAGNOSIS — M549 Dorsalgia, unspecified: Secondary | ICD-10-CM | POA: Insufficient documentation

## 2022-09-15 DIAGNOSIS — R4182 Altered mental status, unspecified: Secondary | ICD-10-CM | POA: Diagnosis not present

## 2022-09-15 DIAGNOSIS — G9341 Metabolic encephalopathy: Secondary | ICD-10-CM | POA: Insufficient documentation

## 2022-09-15 DIAGNOSIS — E876 Hypokalemia: Secondary | ICD-10-CM | POA: Insufficient documentation

## 2022-09-15 LAB — GLUCOSE, CAPILLARY
Glucose-Capillary: 155 mg/dL — ABNORMAL HIGH (ref 70–99)
Glucose-Capillary: 172 mg/dL — ABNORMAL HIGH (ref 70–99)
Glucose-Capillary: 148 mg/dL — ABNORMAL HIGH (ref 70–99)
Glucose-Capillary: 164 mg/dL — ABNORMAL HIGH (ref 70–99)

## 2022-09-15 LAB — BASIC METABOLIC PANEL
Anion gap: 12 (ref 5–15)
BUN: 13 mg/dL (ref 8–23)
CO2: 25 mmol/L (ref 22–32)
Calcium: 9.9 mg/dL (ref 8.9–10.3)
Chloride: 102 mmol/L (ref 98–111)
Creatinine, Ser: 1.36 mg/dL — ABNORMAL HIGH (ref 0.44–1.00)
GFR, Estimated: 39 mL/min — ABNORMAL LOW (ref >60)
Glucose, Bld: 172 mg/dL — ABNORMAL HIGH (ref 70–99)
Potassium: 3.7 mmol/L (ref 3.5–5.1)
Sodium: 139 mmol/L (ref 135–145)

## 2022-09-15 LAB — VITAMIN B1
Vitamin B1 (Thiamine): 104.3 nmol/L (ref 66.5–200.0)
Vitamin B1 (Thiamine): 254.7 nmol/L — ABNORMAL HIGH (ref 66.5–200.0)

## 2022-09-15 LAB — METHYLMALONIC ACID, SERUM: Methylmalonic Acid, Quantitative: 213 nmol/L (ref 0–378)

## 2022-09-15 LAB — CBC
HCT: 36.4 % (ref 36.0–46.0)
Hemoglobin: 12.1 g/dL (ref 12.0–15.0)
MCH: 29.3 pg (ref 26.0–34.0)
MCHC: 33.2 g/dL (ref 30.0–36.0)
MCV: 88.1 fL (ref 80.0–100.0)
Platelets: 239 10*3/uL (ref 150–400)
RBC: 4.13 MIL/uL (ref 3.87–5.11)
RDW: 13.3 % (ref 11.5–15.5)
WBC: 8.9 10*3/uL (ref 4.0–10.5)
nRBC: 0 % (ref 0.0–0.2)

## 2022-09-15 LAB — VITAMIN B6: Vitamin B6: 2.8 ug/L — ABNORMAL LOW (ref 3.4–65.2)

## 2022-09-15 LAB — MAGNESIUM: Magnesium: 1.7 mg/dL (ref 1.7–2.4)

## 2022-09-15 MED ORDER — FOLIC ACID 1 MG PO TABS
1.0000 mg | ORAL_TABLET | Freq: Every day | ORAL | 3 refills | Status: DC
  Filled 2022-09-15: qty 30, 30d supply, fill #0

## 2022-09-15 MED ORDER — CYANOCOBALAMIN 1000 MCG PO TABS
500.0000 ug | ORAL_TABLET | Freq: Every day | ORAL | 3 refills | Status: DC
  Filled 2022-09-15: qty 30, 30d supply, fill #0
  Filled 2022-09-15: qty 15, 30d supply, fill #0

## 2022-09-15 MED ORDER — THIAMINE HCL 100 MG PO TABS
100.0000 mg | ORAL_TABLET | Freq: Every day | ORAL | 0 refills | Status: DC
  Filled 2022-09-15: qty 30, 30d supply, fill #0

## 2022-09-15 MED ORDER — CLONIDINE 0.1 MG/24HR TD PTWK
0.1000 mg | MEDICATED_PATCH | TRANSDERMAL | 12 refills | Status: DC
  Filled 2022-09-15: qty 4, 28d supply, fill #0

## 2022-09-15 MED ORDER — AMLODIPINE BESYLATE 5 MG PO TABS
5.0000 mg | ORAL_TABLET | Freq: Every day | ORAL | 0 refills | Status: DC
  Filled 2022-09-15: qty 30, 30d supply, fill #0

## 2022-09-15 NOTE — Progress Notes (Signed)
Patient was suppose to discharge today. Doctor made aware of loose stools. Wants to run more test.

## 2022-09-15 NOTE — Progress Notes (Signed)
Occupational Therapy Treatment Patient Details Name: Diana Harper MRN: [id number] DOB: [birth year] Today's Date: 09/15/2022   History of present illness Pt is an 86 y/o female who presented with AMS with fixed gaze. MRI brain negative, EEG negative. PMH: prior CVA, type 2 diabetes, essential hypertension, hyperlipidemia, hypothyroidism, recent C. difficile colitis   OT comments  Pt with slow progress toward OT goals this session. She demonstrated increased fear of falling/anxiety that increased the amount of assist needed during sit <> stands and ADL- max-total A overall. She required heavy facilitation for forward weight shift in standing and for pivot. Recommend SNF at d/c d/t high burden of care however pt may participate better with family. Continue OT POC.    Recommendations for follow up therapy are one component of a multi-disciplinary discharge planning process, led by the attending physician.  Recommendations may be updated based on patient status, additional functional criteria and insurance authorization.    Follow Up Recommendations  Skilled nursing-short term rehab (<3 hours/day)    Assistance Recommended at Discharge Frequent or constant Supervision/Assistance  Patient can return home with the following  A lot of help with walking and/or transfers;A lot of help with bathing/dressing/bathroom   Equipment Recommendations  None recommended by OT    Recommendations for Other Services      Precautions / Restrictions Precautions Precautions: Fall Restrictions Weight Bearing Restrictions: No       Mobility Bed Mobility Overal bed mobility: Needs Assistance Bed Mobility: Supine to Sit     Supine to sit: Max assist, HOB elevated     General bed mobility comments: Poor initiation, requiring max A overall    Transfers Overall transfer level: Needs assistance Equipment used: Rolling walker (2 wheels) Transfers: Sit to/from Stand, Bed to chair/wheelchair/BSC Sit to  Stand: Total assist, From elevated surface Stand pivot transfers: Total assist         General transfer comment: Max- total A to power up from EOB. Pt more anxious re transfer today and resisting     Balance Overall balance assessment: Needs assistance, History of Falls Sitting-balance support: Feet supported, No upper extremity supported Sitting balance-Leahy Scale: Fair Sitting balance - Comments: Pt able to statically sit EOB with close (S) fr several minutes Postural control: Posterior lean Standing balance support: During functional activity, Reliant on assistive device for balance Standing balance-Leahy Scale: Zero Standing balance comment: Pt requires manual facilitation for weightshift forward and UE placement                           ADL either performed or assessed with clinical judgement   ADL Overall ADL's : Needs assistance/impaired             Lower Body Bathing: Total assistance;Sit to/from stand       Lower Body Dressing: Total assistance;Sit to/from stand;Cueing for safety;Cueing for sequencing               Functional mobility during ADLs: Total assistance;Cueing for safety;Cueing for sequencing General ADL Comments: Posterior bias, difficulty following commands    Extremity/Trunk Assessment Upper Extremity Assessment Upper Extremity Assessment: Generalized weakness   Lower Extremity Assessment Lower Extremity Assessment: Generalized weakness         Cognition Arousal/Alertness: Awake/alert Behavior During Therapy: WFL for tasks assessed/performed Overall Cognitive Status: History of cognitive impairments - at baseline  General Comments: Pt with hx of dementia- today was more resistive to activity and required max multimodal cueing to follow commands              General Comments VSS    Pertinent Vitals/ Pain       Pain Assessment Pain Assessment: No/denies pain          Frequency  Min 2X/week        Progress Toward Goals  OT Goals(current goals can now be found in the care plan section)  Progress towards OT goals: Progressing toward goals  Acute Rehab OT Goals Patient Stated Goal: none stated OT Goal Formulation: With patient/family Time For Goal Achievement: 09/27/22 Potential to Achieve Goals: [place] Discharge plan needs to be updated       AM-PAC OT "6 Clicks" Daily Activity     Outcome Measure   Help from another person eating meals?: A Little Help from another person taking care of personal grooming?: A Little Help from another person toileting, which includes using toliet, bedpan, or urinal?: Total Help from another person bathing (including washing, rinsing, drying)?: A Lot Help from another person to put on and taking off regular upper body clothing?: A Lot Help from another person to put on and taking off regular lower body clothing?: Total 6 Click Score: 12    End of Session Equipment Utilized During Treatment: Gait belt  OT Visit Diagnosis: Unsteadiness on feet (R26.81);Other abnormalities of gait and mobility (R26.89)   Activity Tolerance Patient tolerated treatment well   Patient Left in chair;with call bell/phone within reach;with chair alarm set   Nurse Communication Mobility status        Time: 1113-1130 OT Time Calculation (min): 17 min  Charges: OT General Charges $OT Visit: 1 Visit OT Treatments $Therapeutic Activity: 8-22 mins  Laverle Hobby, OTR/L, CBIS Acute Rehab Office: [place] 09/15/2022, 1:38 PM

## 2022-09-15 NOTE — TOC Transition Note (Addendum)
Transition of Care [facility]) - CM/SW Discharge Note   Patient Details  Name: Diana Harper MRN: [id number] Date of Birth: Oct 06, 1936  [facility] CM/SW Contact:  Sharin Mons, RN Phone Number: 09/15/2022, 2:05 PM   Clinical Narrative:    Patient will DC to: home  Anticipated DC date: 09/15/2022 Family notified:yes Transport by: car                      AMS Per MD patient ready for DC today. RN, patient, patient's family, and [place] notified of DC. Pt was active with [facility] prior to admit. [name] ( daughter )stated would like to continue with home health provider, [facility].  Pt from home with husband/daughter [name]. Daughters to assist and provide supervision needed once d/c. Pt without DME needs. [facility] will deliver Rx meds to bedside prior to discharge. Post hospital f/u noted on AVS.  Daughter Deidra will provide transportation to home.   Ginelle Bays Daughter [phone]  RNCM will sign off for now as intervention is no longer needed. Please consult [name] again if new needs arise.      Barriers to Discharge: No Barriers Identified   Patient Goals and CMS Choice        Discharge Placement                       Discharge Plan and Services   Discharge Planning Services: CM Consult                      HH Arranged: PT, OT, Social Work [facility] Agency: [place] [place] Date [place]: 09/15/22 Time [place]: [id number] Representative spoke with at [place]: [place] ([place]) Interventions     Readmission Risk Interventions    07/27/2022   10:46 AM  Readmission Risk Prevention Plan  Transportation Screening Complete  PCP or Specialist Appt within 3-5 Days Complete  HRI or [place] Complete  Social Work Consult for [place] Planning/Counseling Complete  Palliative Care Screening Not Applicable  Medication Review [employer] photographer) Complete

## 2022-09-15 NOTE — Discharge Summary (Signed)
Physician Discharge Summary  Diana Harper IAX:[id number] DOB: 1936-04-30 DOA: 09/12/2022  PCP: [name], MD  Admit date: 09/12/2022 Discharge date: 09/15/2022  Admitted From: Home Disposition:  Home  Recommendations for Outpatient Follow-up:  Follow up with PCP in 1-2 weeks Please obtain BMP/CBC in one week   Home Health:Yes Equipment/Devices:none  Discharge Condition:Stable CODE STATUS:Full Diet recommendation: Heart Healthy   Brief/Interim Summary: 86 y.o. female past medical history significant for CVA, diabetes mellitus type 2, essential hypertension recent C. difficile colitis severe thiamine and B12 deficient comes in for code stroke from home.  Neurology was consulted EEG was done       Procedures: EEG is negative for epileptiform activity Transthoracic echocardiogram is pending  Discharge Diagnoses:  Principal Problem:   AMS (altered mental status) Active Problems:   Syncope   Essential hypertension   Uncontrolled type 2 diabetes mellitus with hyperglycemia, without long-term current use of insulin (HCC)   Normocytic anemia   Acute metabolic encephalopathy   Vitamin B12 deficiency   Thiamine deficiency   Back pain   CKD (chronic kidney disease), stage IV (HCC)   Hypokalemia  AMS/unresponsiveness/possible syncope: MRI did not show stroke. EEG was negative for seizures. PT OT evaluated the patient, Home health PT Echocardiogram showed an EF of 60% no wall motion abnormality, 2D echo showed grade 1 diastolic dysfunction. Question vasovagal no events on telemetry work-up basically negative.   Acute metabolic encephalopathy: Likely multifactorial in the setting of thiamine and B12 deficiency. Supplementation was started no evidence of infectious etiology.   Vitamin B12, thiamine and folate deficiency: Continue supplementation of thiamine, M78 and folic acid.   Back pain: MRI of the cervical and thoracic spine disease but stable. PT  evaluated the patient recommended home health PT.   Essential hypertension Was started on clonidine patch and amlodipine. Blood pressure seems to be improved. Follow-up with PCP in 2 weeks   Uncontrolled type 2 diabetes mellitus with hyperglycemia, without long-term current use of insulin  A1c of 7.1, blood glucose reasonably controlled. Patient admitted to her medication continue current regimen.  Normocytic anemia No evidence of overt bleeding. Hemoglobin has remained relatively stable.   CKD (chronic kidney disease), stage IV ([place]) Renal function appears to be at baseline.   Electrolyte imbalance hypokalemia and hypomagnesemia: Try to keep potassium greater than 4 magnesium greater than 2.   Vitamin D3 deficiency: Continue supplementation.  Hypo-thyroidism: Continue Synthroid.      Discharge Instructions  Discharge Instructions     Diet - low sodium heart healthy   Complete by: As directed    Increase activity slowly   Complete by: As directed       Allergies as of 09/15/2022       Reactions   Latex Anaphylaxis, Shortness Of Breath, Other (See Comments)   Severe respiratory distress   Onion Diarrhea   Other Other (See Comments)   Patient cannot have TREE NUTS and anything with seeds- History of  diverticulitis    Flexeril [cyclobenzaprine] Other (See Comments)   Caused agitation   Lisinopril Cough   Metformin And Related Diarrhea   Simvastatin Hives, Rash   Tetracycline Rash   Zithromax [azithromycin] Rash        Medication List     TAKE these medications    acetaminophen 500 MG tablet Commonly known as: TYLENOL Take 1,000 mg by mouth every 8 (eight) hours as needed for mild pain, headache or fever.   amLODipine 5 MG tablet Commonly known as: NORVASC  Take 1 tablet (5 mg total) by mouth daily. Start taking on: September 16, 2022   Bayer Low Dose 81 MG tablet Generic drug: aspirin EC Take 81 mg by mouth daily.   buPROPion 150 MG 24 hr  tablet Commonly known as: WELLBUTRIN XL Take 150 mg by mouth daily.   CALTRATE 600 PO Take 600 mg by mouth daily.   cloNIDine 0.1 mg/24hr patch Commonly known as: CATAPRES - Dosed in mg/24 hr Place 1 patch (0.1 mg total) onto the skin once a week. Start taking on: September 21, 4165   folic acid 1 MG tablet Commonly known as: FOLVITE Take 1 tablet (1 mg total) by mouth daily. Start taking on: September 16, 2022   FreeStyle Libre 14 Day Sensor Misc Inject 1 Device into the skin every 14 (fourteen) days.   Glucagon HCl 1 MG Solr Inject 1 mg into the skin as needed (for a blood sugar <67).   insulin degludec 200 UNIT/ML FlexTouch Pen Commonly known as: TRESIBA Inject 30 Units into the skin See admin instructions. Inject 30 units into the skin daily before brunch   levothyroxine 88 MCG tablet Commonly known as: SYNTHROID Take 88 mcg by mouth daily before breakfast.   nitroGLYCERIN 0.4 MG SL tablet Commonly known as: NITROSTAT Place 1 tablet (0.4 mg total) under the tongue every 5 (five) minutes as needed for chest pain.   NovoLOG FlexPen 100 UNIT/ML FlexPen Generic drug: insulin aspart Inject 5-10 Units into the skin See admin instructions. Inject 5 units into the skin before meals if BGL is 100-150, 7 units if BGL is 151-200, and 10 units if BGL is greater than 200   sertraline 50 MG tablet Commonly known as: ZOLOFT Take 150 mg by mouth at bedtime.   thiamine 100 MG tablet Commonly known as: VITAMIN B1 Take 1 tablet (100 mg total) by mouth daily.   Trelegy Ellipta 200-62.5-25 MCG/ACT Aepb Generic drug: Fluticasone-Umeclidin-Vilant Inhale 1 puff into the lungs daily as needed (for flares).   Vitamin B-12 1000 MCG Subl Place 1 tablet under the tongue daily. What changed: Another medication with the same name was added. Make sure you understand how and when to take each.   cyanocobalamin 500 MCG tablet Commonly known as: VITAMIN B12 Take 1 tablet (500 mcg total) by  mouth daily. Start taking on: September 16, 2022 What changed: You were already taking a medication with the same name, and this prescription was added. Make sure you understand how and when to take each.   Vitamin D3 25 MCG (1000 UT) Caps Take 1,000 Units by mouth daily.        Allergies  Allergen Reactions   Latex Anaphylaxis, Shortness Of Breath and Other (See Comments)    Severe respiratory distress   Onion Diarrhea   Other Other (See Comments)    Patient cannot have TREE NUTS and anything with seeds- History of  diverticulitis    Flexeril [Cyclobenzaprine] Other (See Comments)    Caused agitation   Lisinopril Cough   Metformin And Related Diarrhea   Simvastatin Hives and Rash   Tetracycline Rash   Zithromax [Azithromycin] Rash    Consultations: Neurology   Procedures/Studies: ECHOCARDIOGRAM COMPLETE  Result Date: 09/14/2022    ECHOCARDIOGRAM REPORT   Patient Name:   DREAMA KUNA Date of Exam: 09/14/2022 Medical Rec #:  [id number]      Height:       66.0 in Accession #:    [id number]     Weight:  140.2 lb Date of Birth:  24-Aug-1936     BSA:          1.720 m Patient Age:    20 years       BP:           149/81 mmHg Patient Gender: F              HR:           83 bpm. Exam Location:  Inpatient Procedure: 2D Echo, Cardiac Doppler, Color Doppler and Intracardiac            Opacification Agent Indications:    Syncope R55  History:        Patient has no prior history of Echocardiogram examinations.                 CVA, Signs/Symptoms:Syncope; Risk Factors:Hypertension, Diabetes                 and Dyslipidemia.  Sonographer:    Greer Pickerel Referring Phys: [phone] [facility]  Sonographer Comments: Suboptimal apical window and suboptimal subcostal window. Image acquisition challenging due to COPD and Image acquisition challenging due to respiratory motion. Patient was alm during exam, nurse help calm patient to finish exam. IMPRESSIONS  1. Left ventricular ejection fraction, by  estimation, is 60 to 65%. The left ventricle has normal function. The left ventricle has no regional wall motion abnormalities. Left ventricular diastolic parameters are consistent with Grade I diastolic dysfunction (impaired relaxation).  2. Right ventricular systolic function is normal. The right ventricular size is normal. Tricuspid regurgitation signal is inadequate for assessing PA pressure.  3. The mitral valve is abnormal. Trivial mitral valve regurgitation.  4. The aortic valve is tricuspid. Aortic valve regurgitation is trivial. Aortic valve sclerosis/calcification is present, without any evidence of aortic stenosis.  5. The inferior vena cava is normal in size with greater than 50% respiratory variability, suggesting right atrial pressure of 3 mmHg. Comparison(s): No prior Echocardiogram. FINDINGS  Left Ventricle: Left ventricular ejection fraction, by estimation, is 60 to 65%. The left ventricle has normal function. The left ventricle has no regional wall motion abnormalities. The left ventricular internal cavity size was normal in size. There is  no left ventricular hypertrophy. Left ventricular diastolic parameters are consistent with Grade I diastolic dysfunction (impaired relaxation). Indeterminate filling pressures. Right Ventricle: The right ventricular size is normal. No increase in right ventricular wall thickness. Right ventricular systolic function is normal. Tricuspid regurgitation signal is inadequate for assessing PA pressure. Left Atrium: Left atrial size was normal in size. Right Atrium: Right atrial size was normal in size. Pericardium: There is no evidence of pericardial effusion. Mitral Valve: The mitral valve is abnormal. Mild mitral annular calcification. Trivial mitral valve regurgitation. Tricuspid Valve: The tricuspid valve is grossly normal. Tricuspid valve regurgitation is trivial. Aortic Valve: The aortic valve is tricuspid. Aortic valve regurgitation is trivial. Aortic valve  sclerosis/calcification is present, without any evidence of aortic stenosis. Pulmonic Valve: The pulmonic valve was normal in structure. Pulmonic valve regurgitation is not visualized. Aorta: The aortic root and ascending aorta are structurally normal, with no evidence of dilitation. Venous: The inferior vena cava is normal in size with greater than 50% respiratory variability, suggesting right atrial pressure of 3 mmHg. IAS/Shunts: No atrial level shunt detected by color flow Doppler.  LEFT VENTRICLE PLAX 2D LVIDd:         4.15 cm   Diastology LVIDs:  2.80 cm   LV e' medial:    4.20 cm/s LV PW:         1.10 cm   LV E/e' medial:  14.2 LV IVS:        1.15 cm   LV e' lateral:   5.59 cm/s LVOT diam:     1.70 cm   LV E/e' lateral: 10.7 LV SV:         40 LV SV Index:   23 LVOT Area:     2.27 cm  RIGHT VENTRICLE RV S prime:     18.50 cm/s TAPSE (M-mode): 2.0 cm LEFT ATRIUM           Index        RIGHT ATRIUM          Index LA diam:      2.80 cm 1.63 cm/m   RA Area:     7.23 cm LA Vol (A2C): 19.3 ml 11.22 ml/m  RA Volume:   10.10 ml 5.87 ml/m LA Vol (A4C): 37.5 ml 21.81 ml/m  AORTIC VALVE             PULMONIC VALVE LVOT Vmax:   98.50 cm/s  PR End Diast Vel: 4.08 msec LVOT Vmean:  62.600 cm/s LVOT VTI:    0.175 m  AORTA Ao Root diam: 3.10 cm Ao Asc diam:  3.10 cm MITRAL VALVE MV Area (PHT): 3.85 cm    SHUNTS MV Decel Time: 197 msec    Systemic VTI:  0.18 m MV E velocity: 59.70 cm/s  Systemic Diam: 1.70 cm MV A velocity: 91.70 cm/s MV E/A ratio:  0.65 Lyman Bishop MD Electronically signed by Lyman Bishop MD Signature Date/Time: 09/14/2022/11:45:56 AM    Final    EEG adult  Result Date: 09/13/2022 Lora Havens, MD     09/13/2022  3:14 PM Patient Name: Truc Winfree MRN: [id number] Epilepsy Attending: Lora Havens Referring Physician/Provider: Bonnielee Haff, MD Date: 09/13/2022 Duration: 21.35 mins Patient history: 86yo F with ams. EEG to evaluate for seizure Level of alertness: Awake AEDs  during EEG study: None Technical aspects: This EEG study was done with scalp electrodes positioned according to the 10-20 International system of electrode placement. Electrical activity was reviewed with band pass filter of 1-'70Hz'$ , sensitivity of 7 uV/mm, display speed of 27m/sec with a '60Hz'$  notched filter applied as appropriate. EEG data were recorded continuously and digitally stored.  Video monitoring was available and reviewed as appropriate. Description: The posterior dominant rhythm consists of 8 Hz activity of moderate voltage (25-35 uV) seen predominantly in posterior head regions, symmetric and reactive to eye opening and eye closing. EEG showed intermittent generalized 3 to 6 Hz theta-delta slowing. Hyperventilation and photic stimulation were not performed.   ABNORMALITY - Intermittent slow, generalized IMPRESSION: This study is suggestive of mild diffuse encephalopathy, nonspecific etiology. No seizures or epileptiform discharges were seen throughout the recording. PLora Havens  MR THORACIC SPINE WO CONTRAST  Result Date: 09/13/2022 CLINICAL DATA:  86year old female with altered mental status. Pain, myelopathy after fall. EXAM: MRI THORACIC SPINE WITHOUT CONTRAST TECHNIQUE: Multiplanar, multisequence MR imaging of the thoracic spine was performed. No intravenous contrast was administered. COMPARISON:  Cervical spine MRI today.  Lumbar MRI 01/14/2009. FINDINGS: Study is moderately degraded by motion artifact despite repeated imaging attempts, with the patient crying and asking to halt imaging. Subsequently no diagnostic axial thoracic spine imaging could be obtained. Limited cervical spine imaging:  Reported separately today. Thoracic spine segmentation:  Appears to be normal. Alignment: Mildly exaggerated thoracic kyphosis with a mild degree of thoracic scoliosis. No significant thoracic spondylolisthesis. Vertebrae: Limited detail. Widespread degenerative endplate spurring. No marrow edema  identified. Cord: Grossly normal. Fairly normal thoracic spinal canal patency despite widespread thoracic disc and endplate degeneration. Conus medullaris appears stable since 2010 and within normal limits at T12-L1. Paraspinal and other soft tissues: Limited. Disc levels: Limited, no diagnostic axial imaging could be obtained. Widespread thoracic disc bulging and endplate spurring, but relatively maintained normal thoracic spinal canal patency. No spinal stenosis suspected. IMPRESSION: 1. Motion degraded and truncated thoracic exam with no diagnostic quality axial imaging. 2. But no strong evidence of thoracic spinal stenosis or thoracic spinal cord abnormality, despite widespread thoracic disc and endplate degeneration. Electronically Signed   By: Genevie Ann M.D.   On: 09/13/2022 10:20   MR CERVICAL SPINE WO CONTRAST  Result Date: 09/13/2022 CLINICAL DATA:  86 year old female with altered mental status. Pain, myelopathy after fall. EXAM: MRI CERVICAL SPINE WITHOUT CONTRAST TECHNIQUE: Multiplanar, multisequence MR imaging of the cervical spine was performed. No intravenous contrast was administered. COMPARISON:  Brain MRI 0125 hours today. Previous cervical spine MRI 09/24/2008. FINDINGS: Study is moderately degraded by motion artifact despite repeated imaging attempts, with the patient crying and asking to halt imaging. Alignment: Reversal of cervical lordosis. Vertebrae: Widespread degenerative endplate spurring in the cervical spine. No marrow edema identified. Normal background bone marrow signal. Cord: At least moderate spinal stenosis and cord mass effect at C3 and C4. Limited spinal cord detail. Cannot exclude cord edema or myelomalacia on series 9, image 11 which is approximately the C4 level. Capacious spinal canal below C6. Posterior Fossa, vertebral arteries, paraspinal tissues: Cervicomedullary junction is within normal limits. Negative visible posterior fossa. Major vascular flow voids in the neck  appear to be preserved with dominant left vertebral artery. Negative visible lung apices. Disc levels: Detail is degraded by motion. Bulky disc and/or endplate degeneration R9-F6, C4-C5 and C5-C6. And the C4-C5 level was worst on the 2009 MRI. Mild to moderate spinal stenosis and spinal cord mass effect at both C3-C4 and C4-C5. Mild spinal stenosis at C5-C6. High-grade neural foraminal stenosis throughout those levels. No cervical spinal stenosis below C6. IMPRESSION: 1. Moderately degraded exam. 2. Chronic bulky cervical disc and endplate degeneration C3 through C6 as demonstrated on a 2009 MRI. Subsequent spinal stenosis and spinal cord mass effect likely progressed since that time. Limited cord detail today, cannot exclude myelomalacia or edema at approximately the C4 spinal. 3. High-grade bilateral cervical neural foraminal stenosis suspected at those levels. Electronically Signed   By: Genevie Ann M.D.   On: 09/13/2022 10:16   MR BRAIN WO CONTRAST  Result Date: 09/13/2022 CLINICAL DATA:  Altered mental status EXAM: MRI HEAD WITHOUT CONTRAST TECHNIQUE: Multiplanar, multiecho pulse sequences of the brain and surrounding structures were obtained without intravenous contrast. COMPARISON:  10/10/2017 FINDINGS: Brain: No acute infarct, mass effect or extra-axial collection. No acute or chronic hemorrhage. There is multifocal hyperintense T2-weighted signal within the white matter. Generalized volume loss. The midline structures are normal. Vascular: Major flow voids are preserved. Skull and upper cervical spine: Normal calvarium and skull base. Visualized upper cervical spine and soft tissues are normal. Sinuses/Orbits:No paranasal sinus fluid levels or advanced mucosal thickening. No mastoid or middle ear effusion. Normal orbits. IMPRESSION: 1. No acute intracranial abnormality. 2. Findings of chronic small vessel ischemia and volume loss. Electronically Signed   By: Ulyses Jarred M.D.   On: 09/13/2022  02:06   CT  HEAD CODE STROKE WO CONTRAST  Result Date: 09/12/2022 CLINICAL DATA:  Code stroke.  Acute neurologic deficit EXAM: CT HEAD WITHOUT CONTRAST TECHNIQUE: Contiguous axial images were obtained from the base of the skull through the vertex without intravenous contrast. RADIATION DOSE REDUCTION: This exam was performed according to the departmental dose-optimization program which includes automated exposure control, adjustment of the mA and/or kV according to patient size and/or use of iterative reconstruction technique. COMPARISON:  None Available. FINDINGS: Brain: There is no mass, hemorrhage or extra-axial collection. There is generalized atrophy without lobar predilection. There is hypoattenuation of the periventricular white matter, most commonly indicating chronic ischemic microangiopathy. Vascular: No abnormal hyperdensity of the major intracranial arteries or dural venous sinuses. No intracranial atherosclerosis. Skull: The visualized skull base, calvarium and extracranial soft tissues are normal. Sinuses/Orbits: No fluid levels or advanced mucosal thickening of the visualized paranasal sinuses. No mastoid or middle ear effusion. The orbits are normal. ASPECTS [facility] Stroke Program Early CT Score) - Ganglionic level infarction (caudate, lentiform nuclei, internal capsule, insula, M1-M3 cortex): 7 - Supraganglionic infarction (M4-M6 cortex): 3 Total score (0-10 with 10 being normal): 10 IMPRESSION: 1. No acute intracranial abnormality. 2. ASPECTS is 10 Electronically Signed   By: Ulyses Jarred M.D.   On: 09/12/2022 19:01   (Echo, Carotid, EGD, Colonoscopy, ERCP)    Subjective: No complaints  Discharge Exam: Vitals:   09/15/22 0330 09/15/22 0726  BP: (!) 151/82 (!) 149/76  Pulse: 100 99  Resp:    Temp: 99.3 F (37.4 C) 100 F (37.8 C)  SpO2: 100% 100%   Vitals:   09/14/22 1103 09/14/22 2228 09/15/22 0330 09/15/22 0726  BP: (!) 126/100 (!) 183/148 (!) 151/82 (!) 149/76  Pulse:  86 100 99   Resp:  18    Temp:  99 F (37.2 C) 99.3 F (37.4 C) 100 F (37.8 C)  TempSrc:   Oral Oral  SpO2:  99% 100% 100%  Weight:      Height:        General: Pt is alert, awake, not in acute distress Cardiovascular: RRR, S1/S2 +, no rubs, no gallops Respiratory: CTA bilaterally, no wheezing, no rhonchi Abdominal: Soft, NT, ND, bowel sounds + Extremities: no edema, no cyanosis    The results of significant diagnostics from this hospitalization (including imaging, microbiology, ancillary and laboratory) are listed below for reference.     Microbiology: No results found for this or any previous visit (from the past 240 hour(s)).   Labs: BNP (last 3 results) No results for input(s): "BNP" in the last 8760 hours. Basic Metabolic Panel: Recent Labs  Lab 09/12/22 1847 09/12/22 1851 09/13/22 0558 09/14/22 0253 09/15/22 0306  NA 139 139 139 138 139  K 3.7 3.7 3.4* 3.7 3.7  CL 103 103 106 106 102  CO2 25  --  '24 26 25  '$ GLUCOSE 284* 274* 141* 132* 172*  BUN '20 23 16 11 13  '$ CREATININE 1.80* 1.70* 1.27* 1.25* 1.36*  CALCIUM 9.3  --  8.9 9.1 9.9  MG  --   --  1.6*  --  1.7  PHOS  --   --  2.9  --   --    Liver Function Tests: Recent Labs  Lab 09/12/22 1847 09/14/22 0253  AST 17 16  ALT 16 14  ALKPHOS 53 53  BILITOT 0.3 0.5  PROT 6.1* 6.2*  ALBUMIN 3.4* 3.2*   No results for input(s): "LIPASE", "AMYLASE" in the last  168 hours. No results for input(s): "AMMONIA" in the last 168 hours. CBC: Recent Labs  Lab 09/12/22 1847 09/12/22 1851 09/14/22 0253 09/15/22 0306  WBC 8.6  --  7.7 8.9  NEUTROABS 5.4  --   --   --   HGB 11.8* 11.9* 11.3* 12.1  HCT 35.1* 35.0* 34.6* 36.4  MCV 90.2  --  88.7 88.1  PLT 237  --  230 239   Cardiac Enzymes: No results for input(s): "CKTOTAL", "CKMB", "CKMBINDEX", "TROPONINI" in the last 168 hours. BNP: Invalid input(s): "POCBNP" CBG: Recent Labs  Lab 09/14/22 0749 09/14/22 1156 09/14/22 1621 09/14/22 1954 09/15/22 0725  GLUCAP  102* 99 123* 105* 155*   D-Dimer No results for input(s): "DDIMER" in the last 72 hours. Hgb A1c No results for input(s): "HGBA1C" in the last 72 hours. Lipid Profile No results for input(s): "CHOL", "HDL", "LDLCALC", "TRIG", "CHOLHDL", "LDLDIRECT" in the last 72 hours. Thyroid function studies No results for input(s): "TSH", "T4TOTAL", "T3FREE", "THYROIDAB" in the last 72 hours.  Invalid input(s): "FREET3" Anemia work up Recent Labs    09/12/22 1847 09/13/22 0558 09/14/22 0253  VITAMINB12  --  >7,500* 6,158*  FOLATE 5.1*  --  5.3*  FERRITIN  --   --  135  TIBC  --   --  234*  IRON  --   --  19*  RETICCTPCT  --   --  1.2   Urinalysis    Component Value Date/Time   COLORURINE YELLOW 07/15/2022 [place] 07/15/2022 1034   LABSPEC 1.018 07/15/2022 1034   PHURINE 5.0 07/15/2022 1034   GLUCOSEU NEGATIVE 07/15/2022 1034   HGBUR NEGATIVE 07/15/2022 1034   BILIRUBINUR NEGATIVE 07/15/2022 1034   KETONESUR NEGATIVE 07/15/2022 1034   PROTEINUR NEGATIVE 07/15/2022 1034   UROBILINOGEN 0.2 04/15/2012 1217   NITRITE NEGATIVE 07/15/2022 1034   LEUKOCYTESUR NEGATIVE 07/15/2022 1034   Sepsis Labs Recent Labs  Lab 09/12/22 1847 09/14/22 0253 09/15/22 0306  WBC 8.6 7.7 8.9   Microbiology No results found for this or any previous visit (from the past 240 hour(s)).   Time coordinating discharge: Over 30 minutes  SIGNED:   Charlynne Cousins, MD  [facility] 09/15/2022, 11:44 AM Pager   If 7PM-7AM, please contact night-coverage [email or web address] Password [email or web address]

## 2022-09-15 NOTE — Progress Notes (Incomplete)
[facility] PROGRESS NOTE    Progress Note  Diana Harper  VZD:[id number] DOB: [birth year] DOA: 09/12/2022 PCP: [name], MD     Brief Narrative:   Diana Harper is an 86 y.o. female past medical history significant for CVA, diabetes mellitus type 2, essential hypertension recent C. difficile colitis severe thiamine and B12 deficient comes in for code stroke from home.  Neurology was consulted EEG was done   Procedures: EEG is negative for epileptiform activity Transthoracic echocardiogram EF 29% grade 1 diastolic dysfunction  Assessment/Plan:   AMS/unresponsiveness/possible syncope: MRI did not show stroke. EEG was negative for seizures. PT OT evaluated the patient, Home health PT Echocardiogram showed an EF of 60% no wall motion abnormality, 2D echo showed grade 1 diastolic dysfunction. Patient Today because of multiple bowel movements see below for the details.  Acute metabolic encephalopathy: Likely multifactorial in the setting of thiamine and B12 deficiency. Supplementation was started no evidence of infectious etiology.  Vitamin B12, thiamine and folate deficiency: Continue supplementation of thiamine, J18 and folic acid.  Back pain: MRI of the cervical and thoracic spine disease but stable. PT evaluated the patient recommended home health PT.  Essential hypertension Was started on clonidine patch and amlodipine. Blood pressure seems to be improved.  Uncontrolled type 2 diabetes mellitus with hyperglycemia, without long-term current use of insulin  A1c of 7.1, blood glucose reasonably controlled. Continue sliding scale insulin.  Normocytic anemia No evidence of overt bleeding. Hemoglobin has remained relatively stable.  CKD (chronic kidney disease), stage IV ([place]) Renal function appears to be at baseline.  Electrolyte imbalance hypokalemia and hypomagnesemia: Try to keep potassium greater than 4 magnesium greater than 2.  Vitamin D3  deficiency: Continue supplementation.  Hypo-thyroidism: Continue Synthroid.  Diarrhea: Patient was having 3 bowel movements of watery stool, which is resolved this morning. She was kept an extra day blood cultures were drawn, which were negative chest x-ray showed no signs of infection. She denies any dysuria.  She has no leukocytosis and has remained afebrile. She has been discharged in stable condition.   DVT prophylaxis: lovenox Family Communication:daughter Status is: Inpatient Remains inpatient appropriate because: Acute metabolic encephalopathy    Code Status:     Code Status Orders  (From admission, onward)           Start     Ordered   09/12/22 2048  Full code  Continuous        09/12/22 2047           Code Status History     Date Active Date Inactive Code Status Order ID Comments User Context   07/24/2022 1517 07/28/2022 1904 Full Code [id number]  Reubin Milan, MD ED   07/15/2022 1732 07/17/2022 2346 Full Code [id number]  Jonnie Finner, DO Inpatient   04/06/2021 1319 04/09/2021 1926 Full Code [id number]  Collier Bullock, MD ED   12/12/2019 1432 12/14/2019 1709 Full Code [id number]  Mendel Corning, MD Inpatient   10/10/2017 2025 10/12/2017 0100 Full Code [id number]  Nita Sells, MD Inpatient   08/25/2016 1842 08/26/2016 2357 Full Code [id number]  Kinsinger, Arta Bruce, MD Inpatient   11/15/2011 0132 11/15/2011 1859 Full Code [id number]  Gildardo Pounds, RN Inpatient         IV Access:   Peripheral IV   Procedures and diagnostic studies:   ECHOCARDIOGRAM COMPLETE  Result Date: 09/14/2022    ECHOCARDIOGRAM REPORT   Patient Name:   Diana Harper Date of Exam: 09/14/2022 Medical Rec #:  [id number]      Height:       66.0 in Accession #:    [id number]     Weight:       140.2 lb Date of Birth:  July 08, 1936     BSA:          1.720 m Patient Age:    65 years       BP:           149/81 mmHg Patient Gender: F              HR:           83 bpm. Exam  Location:  Inpatient Procedure: 2D Echo, Cardiac Doppler, Color Doppler and Intracardiac            Opacification Agent Indications:    Syncope R55  History:        Patient has no prior history of Echocardiogram examinations.                 CVA, Signs/Symptoms:Syncope; Risk Factors:Hypertension, Diabetes                 and Dyslipidemia.  Sonographer:    Greer Pickerel Referring Phys: [phone] [facility]  Sonographer Comments: Suboptimal apical window and suboptimal subcostal window. Image acquisition challenging due to COPD and Image acquisition challenging due to respiratory motion. Patient was alm during exam, nurse help calm patient to finish exam. IMPRESSIONS  1. Left ventricular ejection fraction, by estimation, is 60 to 65%. The left ventricle has normal function. The left ventricle has no regional wall motion abnormalities. Left ventricular diastolic parameters are consistent with Grade I diastolic dysfunction (impaired relaxation).  2. Right ventricular systolic function is normal. The right ventricular size is normal. Tricuspid regurgitation signal is inadequate for assessing PA pressure.  3. The mitral valve is abnormal. Trivial mitral valve regurgitation.  4. The aortic valve is tricuspid. Aortic valve regurgitation is trivial. Aortic valve sclerosis/calcification is present, without any evidence of aortic stenosis.  5. The inferior vena cava is normal in size with greater than 50% respiratory variability, suggesting right atrial pressure of 3 mmHg. Comparison(s): No prior Echocardiogram. FINDINGS  Left Ventricle: Left ventricular ejection fraction, by estimation, is 60 to 65%. The left ventricle has normal function. The left ventricle has no regional wall motion abnormalities. The left ventricular internal cavity size was normal in size. There is  no left ventricular hypertrophy. Left ventricular diastolic parameters are consistent with Grade I diastolic dysfunction (impaired relaxation). Indeterminate  filling pressures. Right Ventricle: The right ventricular size is normal. No increase in right ventricular wall thickness. Right ventricular systolic function is normal. Tricuspid regurgitation signal is inadequate for assessing PA pressure. Left Atrium: Left atrial size was normal in size. Right Atrium: Right atrial size was normal in size. Pericardium: There is no evidence of pericardial effusion. Mitral Valve: The mitral valve is abnormal. Mild mitral annular calcification. Trivial mitral valve regurgitation. Tricuspid Valve: The tricuspid valve is grossly normal. Tricuspid valve regurgitation is trivial. Aortic Valve: The aortic valve is tricuspid. Aortic valve regurgitation is trivial. Aortic valve sclerosis/calcification is present, without any evidence of aortic stenosis. Pulmonic Valve: The pulmonic valve was normal in structure. Pulmonic valve regurgitation is not visualized. Aorta: The aortic root and ascending aorta are structurally normal, with no evidence of dilitation. Venous: The inferior vena cava is normal in size with greater than 50% respiratory variability, suggesting right atrial pressure of 3 mmHg. IAS/Shunts: No atrial level shunt detected  by color flow Doppler.  LEFT VENTRICLE PLAX 2D LVIDd:         4.15 cm   Diastology LVIDs:         2.80 cm   LV e' medial:    4.20 cm/s LV PW:         1.10 cm   LV E/e' medial:  14.2 LV IVS:        1.15 cm   LV e' lateral:   5.59 cm/s LVOT diam:     1.70 cm   LV E/e' lateral: 10.7 LV SV:         40 LV SV Index:   23 LVOT Area:     2.27 cm  RIGHT VENTRICLE RV S prime:     18.50 cm/s TAPSE (M-mode): 2.0 cm LEFT ATRIUM           Index        RIGHT ATRIUM          Index LA diam:      2.80 cm 1.63 cm/m   RA Area:     7.23 cm LA Vol (A2C): 19.3 ml 11.22 ml/m  RA Volume:   10.10 ml 5.87 ml/m LA Vol (A4C): 37.5 ml 21.81 ml/m  AORTIC VALVE             PULMONIC VALVE LVOT Vmax:   98.50 cm/s  PR End Diast Vel: 4.08 msec LVOT Vmean:  62.600 cm/s LVOT VTI:    0.175  m  AORTA Ao Root diam: 3.10 cm Ao Asc diam:  3.10 cm MITRAL VALVE MV Area (PHT): 3.85 cm    SHUNTS MV Decel Time: 197 msec    Systemic VTI:  0.18 m MV E velocity: 59.70 cm/s  Systemic Diam: 1.70 cm MV A velocity: 91.70 cm/s MV E/A ratio:  0.65 Lyman Bishop MD Electronically signed by Lyman Bishop MD Signature Date/Time: 09/14/2022/11:45:56 AM    Final    EEG adult  Result Date: 09/13/2022 Lora Havens, MD     09/13/2022  3:14 PM Patient Name: Diana Harper MRN: [id number] Epilepsy Attending: Lora Havens Referring Physician/Provider: Bonnielee Haff, MD Date: 09/13/2022 Duration: 21.35 mins Patient history: 86yo F with ams. EEG to evaluate for seizure Level of alertness: Awake AEDs during EEG study: None Technical aspects: This EEG study was done with scalp electrodes positioned according to the 10-20 International system of electrode placement. Electrical activity was reviewed with band pass filter of 1-'70Hz'$ , sensitivity of 7 uV/mm, display speed of 32m/sec with a '60Hz'$  notched filter applied as appropriate. EEG data were recorded continuously and digitally stored.  Video monitoring was available and reviewed as appropriate. Description: The posterior dominant rhythm consists of 8 Hz activity of moderate voltage (25-35 uV) seen predominantly in posterior head regions, symmetric and reactive to eye opening and eye closing. EEG showed intermittent generalized 3 to 6 Hz theta-delta slowing. Hyperventilation and photic stimulation were not performed.   ABNORMALITY - Intermittent slow, generalized IMPRESSION: This study is suggestive of mild diffuse encephalopathy, nonspecific etiology. No seizures or epileptiform discharges were seen throughout the recording. PBucklandConsultants:   None.   Subjective:    Diana Harper has no new complaints relates she has not had a bowel movement overnight or this morning.  Objective:    Vitals:   09/14/22 1103 09/14/22 2228  09/15/22 0330 09/15/22 0726  BP: (!) 126/100 (!) 183/148 (!) 151/82 (!) 149/76  Pulse:  86 100  99  Resp:  18    Temp:  99 F (37.2 C) 99.3 F (37.4 C) 100 F (37.8 C)  TempSrc:   Oral Oral  SpO2:  99% 100% 100%  Weight:      Height:       SpO2: 100 %   Intake/Output Summary (Last 24 hours) at 09/15/2022 1132 Last data filed at 09/15/2022 0900 Gross per 24 hour  Intake 240 ml  Output 300 ml  Net -60 ml   Filed Weights   09/12/22 1800 09/12/22 1919  Weight: 63.6 kg 63.6 kg    Exam: General exam: In no acute distress. Respiratory system: Good air movement and clear to auscultation. Cardiovascular system: S1 & S2 heard, RRR. No JVD.  Gastrointestinal system: Abdomen is nondistended, soft and nontender.  Extremities: No pedal edema. Skin: No rashes, lesions or ulcers Psychiatry: Judgement and insight appear normal. Mood & affect appropriate.    Data Reviewed:    Labs: Basic Metabolic Panel: Recent Labs  Lab 09/12/22 1847 09/12/22 1851 09/13/22 0558 09/14/22 0253 09/15/22 0306  NA 139 139 139 138 139  K 3.7 3.7 3.4* 3.7 3.7  CL 103 103 106 106 102  CO2 25  --  '24 26 25  '$ GLUCOSE 284* 274* 141* 132* 172*  BUN '20 23 16 11 13  '$ CREATININE 1.80* 1.70* 1.27* 1.25* 1.36*  CALCIUM 9.3  --  8.9 9.1 9.9  MG  --   --  1.6*  --  1.7  PHOS  --   --  2.9  --   --    GFR Estimated Creatinine Clearance: 29.3 mL/min (A) (by C-G formula based on SCr of 1.36 mg/dL (H)). Liver Function Tests: Recent Labs  Lab 09/12/22 1847 09/14/22 0253  AST 17 16  ALT 16 14  ALKPHOS 53 53  BILITOT 0.3 0.5  PROT 6.1* 6.2*  ALBUMIN 3.4* 3.2*   No results for input(s): "LIPASE", "AMYLASE" in the last 168 hours. No results for input(s): "AMMONIA" in the last 168 hours. Coagulation profile Recent Labs  Lab 09/12/22 1847  INR 1.1   COVID-19 Labs  Recent Labs    09/14/22 0253  FERRITIN 135    Lab Results  Component Value Date   SARSCOV2NAA NEGATIVE 04/06/2021   [place]  NEGATIVE 12/12/2019    CBC: Recent Labs  Lab 09/12/22 1847 09/12/22 1851 09/14/22 0253 09/15/22 0306  WBC 8.6  --  7.7 8.9  NEUTROABS 5.4  --   --   --   HGB 11.8* 11.9* 11.3* 12.1  HCT 35.1* 35.0* 34.6* 36.4  MCV 90.2  --  88.7 88.1  PLT 237  --  230 239   Cardiac Enzymes: No results for input(s): "CKTOTAL", "CKMB", "CKMBINDEX", "TROPONINI" in the last 168 hours. BNP (last 3 results) No results for input(s): "PROBNP" in the last 8760 hours. CBG: Recent Labs  Lab 09/14/22 0749 09/14/22 1156 09/14/22 1621 09/14/22 1954 09/15/22 0725  GLUCAP 102* 99 123* 105* 155*   D-Dimer: No results for input(s): "DDIMER" in the last 72 hours. Hgb A1c: No results for input(s): "HGBA1C" in the last 72 hours. Lipid Profile: No results for input(s): "CHOL", "HDL", "LDLCALC", "TRIG", "CHOLHDL", "LDLDIRECT" in the last 72 hours. Thyroid function studies: No results for input(s): "TSH", "T4TOTAL", "T3FREE", "THYROIDAB" in the last 72 hours.  Invalid input(s): "FREET3" Anemia work up: Recent Labs    09/12/22 1847 09/13/22 0558 09/14/22 0253  VITAMINB12  --  >7,500* 6,158*  FOLATE 5.1*  --  5.3*  FERRITIN  --   --  135  TIBC  --   --  234*  IRON  --   --  19*  RETICCTPCT  --   --  1.2   Sepsis Labs: Recent Labs  Lab 09/12/22 1847 09/14/22 0253 09/15/22 0306  WBC 8.6 7.7 8.9   Microbiology No results found for this or any previous visit (from the past 240 hour(s)).   Medications:    amLODipine  5 mg Oral Daily   cloNIDine  0.1 mg Transdermal Weekly   vitamin B-12  500 mcg Oral Daily   enoxaparin (LOVENOX) injection  40 mg Subcutaneous G25K   folic acid  1 mg Oral Daily   insulin aspart  0-5 Units Subcutaneous QHS   insulin aspart  0-9 Units Subcutaneous TID WC   levothyroxine  88 mcg Oral QAC breakfast   LORazepam  1 mg Intravenous Once   multivitamin with minerals  1 tablet Oral Daily   sertraline  150 mg Oral QHS   [START ON 09/22/2022] thiamine (VITAMIN B1)  injection  100 mg Intravenous Daily   Continuous Infusions:  thiamine (VITAMIN B1) injection 500 mg (09/15/22 0436)   Followed by   Derrill Memo ON 09/16/2022] thiamine (VITAMIN B1) injection        LOS: 2 days   Charlynne Cousins  [facility]  09/15/2022, 11:32 AM

## 2022-09-15 NOTE — Plan of Care (Signed)
  Problem: Education: Goal: Ability to describe self-care measures that may prevent or decrease complications (Diabetes Survival Skills Education) will improve Outcome: Progressing Goal: Individualized Educational Video(s) Outcome: Progressing   Problem: Coping: Goal: Ability to adjust to condition or change in health will improve Outcome: Progressing   

## 2022-09-16 DIAGNOSIS — I10 Essential (primary) hypertension: Secondary | ICD-10-CM

## 2022-09-16 DIAGNOSIS — N184 Chronic kidney disease, stage 4 (severe): Secondary | ICD-10-CM

## 2022-09-16 LAB — CBC
HCT: 31.7 % — ABNORMAL LOW (ref 36.0–46.0)
Hemoglobin: 10.7 g/dL — ABNORMAL LOW (ref 12.0–15.0)
MCH: 29.2 pg (ref 26.0–34.0)
MCHC: 33.8 g/dL (ref 30.0–36.0)
MCV: 86.6 fL (ref 80.0–100.0)
Platelets: 231 10*3/uL (ref 150–400)
RBC: 3.66 MIL/uL — ABNORMAL LOW (ref 3.87–5.11)
RDW: 13.4 % (ref 11.5–15.5)
WBC: 9.9 10*3/uL (ref 4.0–10.5)
nRBC: 0 % (ref 0.0–0.2)

## 2022-09-16 LAB — BASIC METABOLIC PANEL
Anion gap: 9 (ref 5–15)
BUN: 20 mg/dL (ref 8–23)
CO2: 24 mmol/L (ref 22–32)
Calcium: 8.9 mg/dL (ref 8.9–10.3)
Chloride: 104 mmol/L (ref 98–111)
Creatinine, Ser: 1.68 mg/dL — ABNORMAL HIGH (ref 0.44–1.00)
GFR, Estimated: 30 mL/min — ABNORMAL LOW (ref >60)
Glucose, Bld: 153 mg/dL — ABNORMAL HIGH (ref 70–99)
Potassium: 3.4 mmol/L — ABNORMAL LOW (ref 3.5–5.1)
Sodium: 137 mmol/L (ref 135–145)

## 2022-09-16 LAB — GLUCOSE, CAPILLARY
Glucose-Capillary: 136 mg/dL — ABNORMAL HIGH (ref 70–99)
Glucose-Capillary: 134 mg/dL — ABNORMAL HIGH (ref 70–99)
Glucose-Capillary: 136 mg/dL — ABNORMAL HIGH (ref 70–99)

## 2022-09-16 MED ORDER — ENOXAPARIN SODIUM 30 MG/0.3ML IJ SOSY: 30.0000 mg | PREFILLED_SYRINGE | INTRAMUSCULAR | Status: DC

## 2022-09-16 MED ORDER — POTASSIUM CHLORIDE CRYS ER 20 MEQ PO TBCR
40.0000 meq | EXTENDED_RELEASE_TABLET | Freq: Once | ORAL | Status: AC
  Administered 2022-09-16: 40 meq via ORAL
  Filled 2022-09-16: qty 2

## 2022-09-16 MED ORDER — THIAMINE MONONITRATE 100 MG PO TABS
250.0000 mg | ORAL_TABLET | Freq: Every day | ORAL | Status: DC
  Administered 2022-09-16: 250 mg via ORAL
  Filled 2022-09-16: qty 3

## 2022-09-16 MED ORDER — THIAMINE MONONITRATE 100 MG PO TABS: 100.0000 mg | ORAL_TABLET | Freq: Every day | ORAL | Status: DC

## 2022-09-16 NOTE — Plan of Care (Signed)
  Problem: Pain Managment: Goal: General experience of comfort will improve Outcome: Progressing   Problem: Safety: Goal: Ability to remain free from injury will improve Outcome: Progressing   Problem: Skin Integrity: Goal: Risk for impaired skin integrity will decrease Outcome: Progressing   

## 2022-09-16 NOTE — Progress Notes (Signed)
[facility] PROGRESS NOTE    Progress Note  Diana Harper  ZOX:[id number] DOB: 1936-01-19 DOA: 09/12/2022 PCP: [name], MD     Brief Narrative:   Diana Harper is an 86 y.o. female past medical history significant for CVA, diabetes mellitus type 2, essential hypertension recent C. difficile colitis severe thiamine and B12 deficient comes in for code stroke from home.  Neurology was consulted EEG was done   Procedures: EEG is negative for epileptiform activity Transthoracic echocardiogram EF 81% grade 1 diastolic dysfunction  Assessment/Plan:   AMS/unresponsiveness/possible syncope: MRI did not show stroke. EEG was negative for seizures. PT OT evaluated the patient, Home health PT Echocardiogram showed an EF of 60% no wall motion abnormality, 2D echo showed grade 1 diastolic dysfunction. Patient was kept an extra day because of multiple bowel movements see below for the details.  Acute metabolic encephalopathy: Likely multifactorial in the setting of thiamine and B12 deficiency. Supplementation was started no evidence of infectious etiology.  Vitamin B12, thiamine and folate deficiency: Continue supplementation of thiamine, X91 and folic acid.  Back pain: MRI of the cervical and thoracic spine disease but stable. PT evaluated the patient recommended home health PT.  Essential hypertension Was started on clonidine patch and amlodipine. Blood pressure seems to be improved.  Uncontrolled type 2 diabetes mellitus with hyperglycemia, without long-term current use of insulin  A1c of 7.1, blood glucose reasonably controlled. Continue sliding scale insulin.  Normocytic anemia No evidence of overt bleeding. Hemoglobin has remained relatively stable.  CKD (chronic kidney disease), stage IV ([place]) Renal function appears to be at baseline.  Electrolyte imbalance hypokalemia and hypomagnesemia: Try to keep potassium greater than 4 magnesium greater than  2.  Vitamin D3 deficiency: Continue supplementation.  Hypo-thyroidism: Continue Synthroid.  Multiple bowel movements and soft: Patient was having 3 bowel movements of watery stool, which is resolved this morning. She was kept an extra day blood cultures were drawn, which were negative chest x-ray showed no signs of infection. She denies any dysuria.  She has no leukocytosis and has remained afebrile. She has been discharged in stable condition.   DVT prophylaxis: lovenox Family Communication:daughter Status is: Inpatient Remains inpatient appropriate because: Acute metabolic encephalopathy    Code Status:     Code Status Orders  (From admission, onward)           Start     Ordered   09/12/22 2048  Full code  Continuous        09/12/22 2047           Code Status History     Date Active Date Inactive Code Status Order ID Comments User Context   07/24/2022 1517 07/28/2022 1904 Full Code [id number]  Reubin Milan, MD ED   07/15/2022 1732 07/17/2022 2346 Full Code [id number]  Jonnie Finner, DO Inpatient   04/06/2021 1319 04/09/2021 1926 Full Code [id number]  Collier Bullock, MD ED   12/12/2019 1432 12/14/2019 1709 Full Code [id number]  Mendel Corning, MD Inpatient   10/10/2017 2025 10/12/2017 0100 Full Code [id number]  Nita Sells, MD Inpatient   08/25/2016 1842 08/26/2016 2357 Full Code [id number]  Kinsinger, Arta Bruce, MD Inpatient   11/15/2011 0132 11/15/2011 1859 Full Code [id number]  Gildardo Pounds, RN Inpatient         IV Access:   Peripheral IV   Procedures and diagnostic studies:   DG CHEST PORT 1 VIEW  Result Date: 09/15/2022 CLINICAL DATA:  Fever EXAM: PORTABLE CHEST 1  VIEW COMPARISON:  12/12/2019, 05/18/2022 FINDINGS: The heart size and mediastinal contours are within normal limits. Aortic atherosclerosis. Both lungs are clear. The visualized skeletal structures are unremarkable. IMPRESSION: No active disease. Electronically Signed   By:  Donavan Foil M.D.   On: 09/15/2022 19:25     Medical Consultants:   None.   Subjective:    Diana Harper she has no new complaints relates she has not had a bowel movement overnight or this morning.  Objective:    Vitals:   09/15/22 1925 09/16/22 0300 09/16/22 0724 09/16/22 1312  BP: 136/64 130/60 132/67 (!) 124/58  Pulse: 94 84 86 87  Resp: '20 18 19 18  '$ Temp: 98.9 F (37.2 C) 98 F (36.7 C) 98.6 F (37 C) 98.8 F (37.1 C)  TempSrc: Oral Oral Oral   SpO2: 94% 96% 96% 99%  Weight:      Height:       SpO2: 99 %  No intake or output data in the 24 hours ending 09/16/22 1552  Filed Weights   09/12/22 1800 09/12/22 1919  Weight: 63.6 kg 63.6 kg    Exam: General exam: In no acute distress. Respiratory system: Good air movement and clear to auscultation. Cardiovascular system: S1 & S2 heard, RRR. No JVD.  Gastrointestinal system: Abdomen is nondistended, soft and nontender.  Extremities: No pedal edema. Skin: No rashes, lesions or ulcers Psychiatry: Judgement and insight appear normal. Mood & affect appropriate.    Data Reviewed:    Labs: Basic Metabolic Panel: Recent Labs  Lab 09/12/22 1847 09/12/22 1851 09/13/22 0558 09/14/22 0253 09/15/22 0306 09/16/22 0245  NA 139 139 139 138 139 137  K 3.7 3.7 3.4* 3.7 3.7 3.4*  CL 103 103 106 106 102 104  CO2 25  --  '24 26 25 24  '$ GLUCOSE 284* 274* 141* 132* 172* 153*  BUN '20 23 16 11 13 20  '$ CREATININE 1.80* 1.70* 1.27* 1.25* 1.36* 1.68*  CALCIUM 9.3  --  8.9 9.1 9.9 8.9  MG  --   --  1.6*  --  1.7  --   PHOS  --   --  2.9  --   --   --     GFR Estimated Creatinine Clearance: 23.8 mL/min (A) (by C-G formula based on SCr of 1.68 mg/dL (H)). Liver Function Tests: Recent Labs  Lab 09/12/22 1847 09/14/22 0253  AST 17 16  ALT 16 14  ALKPHOS 53 53  BILITOT 0.3 0.5  PROT 6.1* 6.2*  ALBUMIN 3.4* 3.2*    No results for input(s): "LIPASE", "AMYLASE" in the last 168 hours. No results for input(s):  "AMMONIA" in the last 168 hours. Coagulation profile Recent Labs  Lab 09/12/22 1847  INR 1.1    COVID-19 Labs  Recent Labs    09/14/22 0253  FERRITIN 135     Lab Results  Component Value Date   SARSCOV2NAA NEGATIVE 04/06/2021   [place] NEGATIVE 12/12/2019    CBC: Recent Labs  Lab 09/12/22 1847 09/12/22 1851 09/14/22 0253 09/15/22 0306 09/16/22 0245  WBC 8.6  --  7.7 8.9 9.9  NEUTROABS 5.4  --   --   --   --   HGB 11.8* 11.9* 11.3* 12.1 10.7*  HCT 35.1* 35.0* 34.6* 36.4 31.7*  MCV 90.2  --  88.7 88.1 86.6  PLT 237  --  230 239 231    Cardiac Enzymes: No results for input(s): "CKTOTAL", "CKMB", "CKMBINDEX", "TROPONINI" in the last 168 hours. BNP (last  3 results) No results for input(s): "PROBNP" in the last 8760 hours. CBG: Recent Labs  Lab 09/15/22 1148 09/15/22 1700 09/15/22 2036 09/16/22 0733 09/16/22 1208  GLUCAP 172* 148* 164* 136* 134*    D-Dimer: No results for input(s): "DDIMER" in the last 72 hours. Hgb A1c: No results for input(s): "HGBA1C" in the last 72 hours. Lipid Profile: No results for input(s): "CHOL", "HDL", "LDLCALC", "TRIG", "CHOLHDL", "LDLDIRECT" in the last 72 hours. Thyroid function studies: No results for input(s): "TSH", "T4TOTAL", "T3FREE", "THYROIDAB" in the last 72 hours.  Invalid input(s): "FREET3" Anemia work up: Recent Labs    09/14/22 0253  VITAMINB12 6,158*  FOLATE 5.3*  FERRITIN 135  TIBC 234*  IRON 19*  RETICCTPCT 1.2    Sepsis Labs: Recent Labs  Lab 09/12/22 1847 09/14/22 0253 09/15/22 0306 09/16/22 0245  WBC 8.6 7.7 8.9 9.9    Microbiology Recent Results (from the past 240 hour(s))  Culture, blood (Routine X 2) w Reflex to ID Panel     Status: None (Preliminary result)   Collection Time: 09/15/22  7:49 PM   Specimen: BLOOD  Result Value Ref Range Status   Specimen Description BLOOD SITE NOT SPECIFIED  Final   Special Requests   Final    BOTTLES DRAWN AEROBIC AND ANAEROBIC Blood Culture  adequate volume   Culture   Final    NO GROWTH < 12 HOURS Performed at [facility], [place] [address], [place], [place] [address]    Report Status PENDING  Incomplete  Culture, blood (Routine X 2) w Reflex to ID Panel     Status: None (Preliminary result)   Collection Time: 09/15/22  7:53 PM   Specimen: BLOOD  Result Value Ref Range Status   Specimen Description BLOOD SITE NOT SPECIFIED  Final   Special Requests   Final    BOTTLES DRAWN AEROBIC AND ANAEROBIC Blood Culture adequate volume   Culture   Final    NO GROWTH < 12 HOURS Performed at [facility], [place] [address], [place], [place] [address]    Report Status PENDING  Incomplete     Medications:    amLODipine  5 mg Oral Daily   cloNIDine  0.1 mg Transdermal Weekly   vitamin B-12  500 mcg Oral Daily   enoxaparin (LOVENOX) injection  30 mg Subcutaneous J50K   folic acid  1 mg Oral Daily   insulin aspart  0-5 Units Subcutaneous QHS   insulin aspart  0-9 Units Subcutaneous TID WC   levothyroxine  88 mcg Oral QAC breakfast   LORazepam  1 mg Intravenous Once   multivitamin with minerals  1 tablet Oral Daily   sertraline  150 mg Oral QHS   thiamine  250 mg Oral Daily   Followed by   Derrill Memo ON 09/22/2022] thiamine  100 mg Oral Daily   Continuous Infusions:      LOS: 3 days   Charlynne Cousins  [facility]  09/16/2022, 3:52 PM

## 2022-09-16 NOTE — Care Management Important Message (Signed)
Important Message  Patient Details  Name: Diana Harper MRN: [id number] Date of Birth: 1936/04/23   Medicare Important Message Given:  Other (see comment)     Hannah Beat 09/16/2022, 11:05 AM

## 2022-09-16 NOTE — Progress Notes (Signed)
11/19 Pt unable to sign, I contacted her daughter [name]) and spoke to her via telephone and explained the IM Letter and rcv'd verbal confirmation.

## 2022-09-16 NOTE — Progress Notes (Signed)
Physical Therapy Treatment Patient Details Name: Diana Harper MRN: [id number] DOB: 08-04-1936 Today's Date: 09/16/2022   History of Present Illness Pt is an 86 y/o female who presented with AMS with fixed gaze. MRI brain negative, EEG negative. PMH: prior CVA, type 2 diabetes, essential hypertension, hyperlipidemia, hypothyroidism, recent C. difficile colitis    PT Comments    Patient with frequent loose stools and required pericare prior to mobility. Agreeable to attempt mobility but fearful of loose stool. Able to come to EOB with modA and stand with maxA but quickly returns to sitting due to fear of incontinence. Provided encouragement but patient declined further mobility. D/c plan remains appropriate unless family is unable to provide necessary level of assist.    Recommendations for follow up therapy are one component of a multi-disciplinary discharge planning process, led by the attending physician.  Recommendations may be updated based on patient status, additional functional criteria and insurance authorization.  Follow Up Recommendations  Home health PT (would benefit more from SNF but family request HHPT)     Assistance Recommended at Discharge Frequent or constant Supervision/Assistance  Patient can return home with the following A lot of help with walking and/or transfers;A lot of help with bathing/dressing/bathroom;Assistance with cooking/housework;Direct supervision/assist for medications management;Help with stairs or ramp for entrance;Assist for transportation   Equipment Recommendations  None recommended by PT    Recommendations for Other Services       Precautions / Restrictions Precautions Precautions: Fall Precaution Comments: frequent loose stools Restrictions Weight Bearing Restrictions: No     Mobility  Bed Mobility Overal bed mobility: Needs Assistance Bed Mobility: Rolling, Supine to Sit, Sit to Supine Rolling: Min assist   Supine to sit: Mod  assist Sit to supine: Mod assist   General bed mobility comments: assist for rolling for pericare prior to mobility due to found in loose stool. Assist for trunk elevation and repositioning hips towards EOB    Transfers Overall transfer level: Needs assistance Equipment used: Rolling [name] (2 wheels) Transfers: Sit to/from Stand Sit to Stand: Max assist           General transfer comment: maxA to stand from EOB with cues for hand placement. Quickly returned to sitting due to fear of loose stool    Ambulation/Gait                   Stairs             Wheelchair Mobility    Modified Rankin (Stroke Patients Only)       Balance Overall balance assessment: Needs assistance, History of Falls Sitting-balance support: Feet supported, No upper extremity supported Sitting balance-Leahy Scale: Fair     Standing balance support: During functional activity, Reliant on assistive device for balance Standing balance-Leahy Scale: Zero                              Cognition Arousal/Alertness: Awake/alert Behavior During Therapy: WFL for tasks assessed/performed Overall Cognitive Status: History of cognitive impairments - at baseline                                 General Comments: hx of dementia - agreeable to activity but fearful of loose stools        Exercises      General Comments        Pertinent Vitals/Pain Pain Assessment Pain  Assessment: Faces Faces Pain Scale: Hurts even more Pain Location: buttocks Pain Descriptors / Indicators: Grimacing Pain Intervention(s): Monitored during session    Home Living                          Prior Function            PT Goals (current goals can now be found in the care plan section) Acute Rehab PT Goals PT Goal Formulation: With patient Time For Goal Achievement: 09/27/22 Potential to Achieve Goals: Good Progress towards PT goals: Progressing toward goals     Frequency    Min 3X/week      PT Plan Current plan remains appropriate    Co-evaluation              AM-PAC PT "6 Clicks" Mobility   Outcome Measure  Help needed turning from your back to your side while in a flat bed without using bedrails?: A Little Help needed moving from lying on your back to sitting on the side of a flat bed without using bedrails?: A Lot Help needed moving to and from a bed to a chair (including a wheelchair)?: A Lot Help needed standing up from a chair using your arms (e.g., wheelchair or bedside chair)?: A Lot Help needed to walk in hospital room?: Total Help needed climbing 3-5 steps with a railing? : Total 6 Click Score: 11    End of Session Equipment Utilized During Treatment: Gait belt Activity Tolerance: Patient tolerated treatment well Patient left: in bed;with call bell/phone within reach;with bed alarm set Nurse Communication: Mobility status PT Visit Diagnosis: Unsteadiness on feet (R26.81);Muscle weakness (generalized) (M62.81);Difficulty in walking, not elsewhere classified (R26.2)     Time: 6153-7943 PT Time Calculation (min) (ACUTE ONLY): 19 min  Charges:  $Therapeutic Activity: 8-22 mins                     Pavel Gadd A. Gilford Rile PT, DPT Acute Rehabilitation Services Office [phone]    Linna Hoff 09/16/2022, 2:52 PM

## 2022-09-16 NOTE — Progress Notes (Signed)
AVS given and reviewed with pt's daughter, Diedra at bedside. Medications previously delivered by [place] and discussed. All questions answered to satisfaction. Pt's daughter verbalized understanding of information given. Pt escorted off the unit with all belongings via wheelchair by staff member.

## 2022-09-20 LAB — CULTURE, BLOOD (ROUTINE X 2)
Culture: NO GROWTH
Culture: NO GROWTH
Special Requests: ADEQUATE
Special Requests: ADEQUATE

## 2022-09-21 LAB — MISC LABCORP TEST (SEND OUT): Labcorp test code: 70115

## 2022-09-22 LAB — COPPER, SERUM: Copper: 81 ug/dL (ref 80–158)

## 2022-09-22 LAB — ZINC: Zinc: 46 ug/dL (ref 44–115)

## 2022-09-23 DIAGNOSIS — E559 Vitamin D deficiency, unspecified: Secondary | ICD-10-CM | POA: Diagnosis not present

## 2022-09-23 DIAGNOSIS — Z7982 Long term (current) use of aspirin: Secondary | ICD-10-CM | POA: Diagnosis not present

## 2022-09-23 DIAGNOSIS — A0472 Enterocolitis due to Clostridium difficile, not specified as recurrent: Secondary | ICD-10-CM | POA: Diagnosis not present

## 2022-09-23 DIAGNOSIS — Z9181 History of falling: Secondary | ICD-10-CM | POA: Diagnosis not present

## 2022-09-23 DIAGNOSIS — K8020 Calculus of gallbladder without cholecystitis without obstruction: Secondary | ICD-10-CM | POA: Diagnosis not present

## 2022-09-23 DIAGNOSIS — K5732 Diverticulitis of large intestine without perforation or abscess without bleeding: Secondary | ICD-10-CM | POA: Diagnosis not present

## 2022-09-23 DIAGNOSIS — N1832 Chronic kidney disease, stage 3b: Secondary | ICD-10-CM | POA: Diagnosis not present

## 2022-09-23 DIAGNOSIS — H409 Unspecified glaucoma: Secondary | ICD-10-CM | POA: Diagnosis not present

## 2022-09-23 DIAGNOSIS — E1122 Type 2 diabetes mellitus with diabetic chronic kidney disease: Secondary | ICD-10-CM | POA: Diagnosis not present

## 2022-09-23 DIAGNOSIS — M19012 Primary osteoarthritis, left shoulder: Secondary | ICD-10-CM | POA: Diagnosis not present

## 2022-09-23 DIAGNOSIS — R32 Unspecified urinary incontinence: Secondary | ICD-10-CM | POA: Diagnosis not present

## 2022-09-23 DIAGNOSIS — I69354 Hemiplegia and hemiparesis following cerebral infarction affecting left non-dominant side: Secondary | ICD-10-CM | POA: Diagnosis not present

## 2022-09-23 DIAGNOSIS — D649 Anemia, unspecified: Secondary | ICD-10-CM | POA: Diagnosis not present

## 2022-09-23 DIAGNOSIS — I129 Hypertensive chronic kidney disease with stage 1 through stage 4 chronic kidney disease, or unspecified chronic kidney disease: Secondary | ICD-10-CM | POA: Diagnosis not present

## 2022-09-23 DIAGNOSIS — J449 Chronic obstructive pulmonary disease, unspecified: Secondary | ICD-10-CM | POA: Diagnosis not present

## 2022-09-23 DIAGNOSIS — Z794 Long term (current) use of insulin: Secondary | ICD-10-CM | POA: Diagnosis not present

## 2022-09-23 DIAGNOSIS — F419 Anxiety disorder, unspecified: Secondary | ICD-10-CM | POA: Diagnosis not present

## 2022-09-23 DIAGNOSIS — H9193 Unspecified hearing loss, bilateral: Secondary | ICD-10-CM | POA: Diagnosis not present

## 2022-09-23 DIAGNOSIS — K760 Fatty (change of) liver, not elsewhere classified: Secondary | ICD-10-CM | POA: Diagnosis not present

## 2022-09-23 DIAGNOSIS — E039 Hypothyroidism, unspecified: Secondary | ICD-10-CM | POA: Diagnosis not present

## 2022-09-23 DIAGNOSIS — F32A Depression, unspecified: Secondary | ICD-10-CM | POA: Diagnosis not present

## 2022-09-23 DIAGNOSIS — E1140 Type 2 diabetes mellitus with diabetic neuropathy, unspecified: Secondary | ICD-10-CM | POA: Diagnosis not present

## 2022-09-23 DIAGNOSIS — E876 Hypokalemia: Secondary | ICD-10-CM | POA: Diagnosis not present

## 2022-09-23 DIAGNOSIS — E785 Hyperlipidemia, unspecified: Secondary | ICD-10-CM | POA: Diagnosis not present

## 2022-09-25 DIAGNOSIS — M19012 Primary osteoarthritis, left shoulder: Secondary | ICD-10-CM | POA: Diagnosis not present

## 2022-09-25 DIAGNOSIS — A0472 Enterocolitis due to Clostridium difficile, not specified as recurrent: Secondary | ICD-10-CM | POA: Diagnosis not present

## 2022-09-25 DIAGNOSIS — E1122 Type 2 diabetes mellitus with diabetic chronic kidney disease: Secondary | ICD-10-CM | POA: Diagnosis not present

## 2022-09-25 DIAGNOSIS — K5732 Diverticulitis of large intestine without perforation or abscess without bleeding: Secondary | ICD-10-CM | POA: Diagnosis not present

## 2022-09-25 DIAGNOSIS — I129 Hypertensive chronic kidney disease with stage 1 through stage 4 chronic kidney disease, or unspecified chronic kidney disease: Secondary | ICD-10-CM | POA: Diagnosis not present

## 2022-09-25 DIAGNOSIS — I69354 Hemiplegia and hemiparesis following cerebral infarction affecting left non-dominant side: Secondary | ICD-10-CM | POA: Diagnosis not present

## 2022-09-28 ENCOUNTER — Ambulatory Visit: Payer: Self-pay

## 2022-09-28 NOTE — Patient Outreach (Signed)
  Care Coordination   09/28/2022 Name: Diana Harper MRN: [id number] DOB: 1936/06/01   Care Coordination Outreach Attempts:  An unsuccessful telephone outreach was attempted for a scheduled appointment today.  Follow Up Plan:  Additional outreach attempts will be made to offer the patient care coordination information and services.   Encounter Outcome:  No Answer  Care Coordination Interventions Activated:  No   Care Coordination Interventions:  No, not indicated    Barb Merino, RN, BSN, CCM Care Management Coordinator [facility] Care Management Direct Phone: [phone]

## 2022-09-30 DIAGNOSIS — M19012 Primary osteoarthritis, left shoulder: Secondary | ICD-10-CM | POA: Diagnosis not present

## 2022-09-30 DIAGNOSIS — E1122 Type 2 diabetes mellitus with diabetic chronic kidney disease: Secondary | ICD-10-CM | POA: Diagnosis not present

## 2022-09-30 DIAGNOSIS — I69354 Hemiplegia and hemiparesis following cerebral infarction affecting left non-dominant side: Secondary | ICD-10-CM | POA: Diagnosis not present

## 2022-09-30 DIAGNOSIS — A0472 Enterocolitis due to Clostridium difficile, not specified as recurrent: Secondary | ICD-10-CM | POA: Diagnosis not present

## 2022-09-30 DIAGNOSIS — K5732 Diverticulitis of large intestine without perforation or abscess without bleeding: Secondary | ICD-10-CM | POA: Diagnosis not present

## 2022-09-30 DIAGNOSIS — I129 Hypertensive chronic kidney disease with stage 1 through stage 4 chronic kidney disease, or unspecified chronic kidney disease: Secondary | ICD-10-CM | POA: Diagnosis not present

## 2022-10-01 ENCOUNTER — Telehealth: Payer: Self-pay | Admitting: Nurse Practitioner

## 2022-10-01 DIAGNOSIS — R634 Abnormal weight loss: Secondary | ICD-10-CM | POA: Diagnosis not present

## 2022-10-01 DIAGNOSIS — E538 Deficiency of other specified B group vitamins: Secondary | ICD-10-CM | POA: Diagnosis not present

## 2022-10-01 DIAGNOSIS — R630 Anorexia: Secondary | ICD-10-CM | POA: Diagnosis not present

## 2022-10-01 DIAGNOSIS — F0390 Unspecified dementia without behavioral disturbance: Secondary | ICD-10-CM | POA: Diagnosis not present

## 2022-10-01 DIAGNOSIS — A0472 Enterocolitis due to Clostridium difficile, not specified as recurrent: Secondary | ICD-10-CM | POA: Diagnosis not present

## 2022-10-01 DIAGNOSIS — I10 Essential (primary) hypertension: Secondary | ICD-10-CM | POA: Diagnosis not present

## 2022-10-01 DIAGNOSIS — I639 Cerebral infarction, unspecified: Secondary | ICD-10-CM | POA: Diagnosis not present

## 2022-10-01 NOTE — Telephone Encounter (Signed)
Inbound call from patient daughter states she was recently hospitalized and her pcp wanted her to follow up with GI. States pcp office was going to have an emergency referral put in for her to be seen sooner than her appt with APP on 12/7. Please advise.

## 2022-10-01 NOTE — Telephone Encounter (Addendum)
Spoke with Pt daughter [name]: [name] requesting sooner appointment: Appointment scheduled on 10/18/2022 to see Carl Best NP: [name] made aware [name] verbalized understanding with all questions answered.

## 2022-10-04 DIAGNOSIS — E663 Overweight: Secondary | ICD-10-CM | POA: Diagnosis not present

## 2022-10-04 DIAGNOSIS — E519 Thiamine deficiency, unspecified: Secondary | ICD-10-CM | POA: Diagnosis not present

## 2022-10-04 DIAGNOSIS — E538 Deficiency of other specified B group vitamins: Secondary | ICD-10-CM | POA: Diagnosis not present

## 2022-10-06 DIAGNOSIS — A0472 Enterocolitis due to Clostridium difficile, not specified as recurrent: Secondary | ICD-10-CM | POA: Diagnosis not present

## 2022-10-06 DIAGNOSIS — I129 Hypertensive chronic kidney disease with stage 1 through stage 4 chronic kidney disease, or unspecified chronic kidney disease: Secondary | ICD-10-CM | POA: Diagnosis not present

## 2022-10-06 DIAGNOSIS — K5732 Diverticulitis of large intestine without perforation or abscess without bleeding: Secondary | ICD-10-CM | POA: Diagnosis not present

## 2022-10-06 DIAGNOSIS — E1122 Type 2 diabetes mellitus with diabetic chronic kidney disease: Secondary | ICD-10-CM | POA: Diagnosis not present

## 2022-10-06 DIAGNOSIS — I69354 Hemiplegia and hemiparesis following cerebral infarction affecting left non-dominant side: Secondary | ICD-10-CM | POA: Diagnosis not present

## 2022-10-06 DIAGNOSIS — M19012 Primary osteoarthritis, left shoulder: Secondary | ICD-10-CM | POA: Diagnosis not present

## 2022-10-07 DIAGNOSIS — I152 Hypertension secondary to endocrine disorders: Secondary | ICD-10-CM | POA: Diagnosis not present

## 2022-10-07 DIAGNOSIS — E1159 Type 2 diabetes mellitus with other circulatory complications: Secondary | ICD-10-CM | POA: Diagnosis not present

## 2022-10-07 DIAGNOSIS — R634 Abnormal weight loss: Secondary | ICD-10-CM | POA: Diagnosis not present

## 2022-10-07 DIAGNOSIS — Z23 Encounter for immunization: Secondary | ICD-10-CM | POA: Diagnosis not present

## 2022-10-07 DIAGNOSIS — E1165 Type 2 diabetes mellitus with hyperglycemia: Secondary | ICD-10-CM | POA: Diagnosis not present

## 2022-10-07 DIAGNOSIS — E1121 Type 2 diabetes mellitus with diabetic nephropathy: Secondary | ICD-10-CM | POA: Diagnosis not present

## 2022-10-07 DIAGNOSIS — K5900 Constipation, unspecified: Secondary | ICD-10-CM | POA: Diagnosis not present

## 2022-10-07 DIAGNOSIS — E1169 Type 2 diabetes mellitus with other specified complication: Secondary | ICD-10-CM | POA: Diagnosis not present

## 2022-10-07 DIAGNOSIS — E1122 Type 2 diabetes mellitus with diabetic chronic kidney disease: Secondary | ICD-10-CM | POA: Diagnosis not present

## 2022-10-07 DIAGNOSIS — M7989 Other specified soft tissue disorders: Secondary | ICD-10-CM | POA: Diagnosis not present

## 2022-10-07 DIAGNOSIS — E538 Deficiency of other specified B group vitamins: Secondary | ICD-10-CM | POA: Diagnosis not present

## 2022-10-07 DIAGNOSIS — E782 Mixed hyperlipidemia: Secondary | ICD-10-CM | POA: Diagnosis not present

## 2022-10-07 DIAGNOSIS — I129 Hypertensive chronic kidney disease with stage 1 through stage 4 chronic kidney disease, or unspecified chronic kidney disease: Secondary | ICD-10-CM | POA: Diagnosis not present

## 2022-10-08 ENCOUNTER — Ambulatory Visit: Payer: Medicare Other | Admitting: Gastroenterology

## 2022-10-08 DIAGNOSIS — I129 Hypertensive chronic kidney disease with stage 1 through stage 4 chronic kidney disease, or unspecified chronic kidney disease: Secondary | ICD-10-CM | POA: Diagnosis not present

## 2022-10-08 DIAGNOSIS — M19012 Primary osteoarthritis, left shoulder: Secondary | ICD-10-CM | POA: Diagnosis not present

## 2022-10-08 DIAGNOSIS — K5732 Diverticulitis of large intestine without perforation or abscess without bleeding: Secondary | ICD-10-CM | POA: Diagnosis not present

## 2022-10-08 DIAGNOSIS — E1122 Type 2 diabetes mellitus with diabetic chronic kidney disease: Secondary | ICD-10-CM | POA: Diagnosis not present

## 2022-10-08 DIAGNOSIS — A0472 Enterocolitis due to Clostridium difficile, not specified as recurrent: Secondary | ICD-10-CM | POA: Diagnosis not present

## 2022-10-08 DIAGNOSIS — I69354 Hemiplegia and hemiparesis following cerebral infarction affecting left non-dominant side: Secondary | ICD-10-CM | POA: Diagnosis not present

## 2022-10-11 DIAGNOSIS — A0472 Enterocolitis due to Clostridium difficile, not specified as recurrent: Secondary | ICD-10-CM | POA: Diagnosis not present

## 2022-10-11 DIAGNOSIS — K5732 Diverticulitis of large intestine without perforation or abscess without bleeding: Secondary | ICD-10-CM | POA: Diagnosis not present

## 2022-10-11 DIAGNOSIS — I129 Hypertensive chronic kidney disease with stage 1 through stage 4 chronic kidney disease, or unspecified chronic kidney disease: Secondary | ICD-10-CM | POA: Diagnosis not present

## 2022-10-11 DIAGNOSIS — I69354 Hemiplegia and hemiparesis following cerebral infarction affecting left non-dominant side: Secondary | ICD-10-CM | POA: Diagnosis not present

## 2022-10-11 DIAGNOSIS — M19012 Primary osteoarthritis, left shoulder: Secondary | ICD-10-CM | POA: Diagnosis not present

## 2022-10-11 DIAGNOSIS — E1122 Type 2 diabetes mellitus with diabetic chronic kidney disease: Secondary | ICD-10-CM | POA: Diagnosis not present

## 2022-10-13 DIAGNOSIS — E1122 Type 2 diabetes mellitus with diabetic chronic kidney disease: Secondary | ICD-10-CM | POA: Diagnosis not present

## 2022-10-13 DIAGNOSIS — A0472 Enterocolitis due to Clostridium difficile, not specified as recurrent: Secondary | ICD-10-CM | POA: Diagnosis not present

## 2022-10-13 DIAGNOSIS — M19012 Primary osteoarthritis, left shoulder: Secondary | ICD-10-CM | POA: Diagnosis not present

## 2022-10-13 DIAGNOSIS — K5732 Diverticulitis of large intestine without perforation or abscess without bleeding: Secondary | ICD-10-CM | POA: Diagnosis not present

## 2022-10-13 DIAGNOSIS — I129 Hypertensive chronic kidney disease with stage 1 through stage 4 chronic kidney disease, or unspecified chronic kidney disease: Secondary | ICD-10-CM | POA: Diagnosis not present

## 2022-10-13 DIAGNOSIS — I69354 Hemiplegia and hemiparesis following cerebral infarction affecting left non-dominant side: Secondary | ICD-10-CM | POA: Diagnosis not present

## 2022-10-18 ENCOUNTER — Ambulatory Visit (INDEPENDENT_AMBULATORY_CARE_PROVIDER_SITE_OTHER): Payer: Medicare Other | Admitting: Nurse Practitioner

## 2022-10-18 ENCOUNTER — Encounter: Payer: Self-pay | Admitting: Nurse Practitioner

## 2022-10-18 VITALS — BP 120/68 | HR 100 | Ht 65.0 in | Wt 138.4 lb

## 2022-10-18 DIAGNOSIS — R197 Diarrhea, unspecified: Secondary | ICD-10-CM

## 2022-10-18 NOTE — Progress Notes (Addendum)
10/18/2022 Diana Harper [id number] Apr 13, 1936   Chief Complaint: Diarrhea   History of Present Illness:  Diana Harper is an 86 year old female with a past medical history of anxiety, hypertension, hyperlipidemia, hypothyroidism, CVA, diabetes mellitus type 2, CKD stage III, COPD, glaucoma, recurrent diverticulitis, GERD, gastric ulcer 02/2021, C. difficile and colon polyps.  Past cholecystectomy and hysterectomy.     I last saw Diana Harper in office 08/09/2022 for follow up regarding her hospitalization for diverticulitis and [name] colitis.  Refer to that office visit for comprehensive history review.  At that time, she was on day # 12 of Vancomycin 115m po QID and her diarrhea significantly improved but she was not passing any firm solid stools.  She was instructed to complete the previously prescribed 14 day course of Vancomycin then Vancomycin 1276mone po tid x 7 days then 1255mne po bid x 7 days then 125m27me po once daily x 7 days.  Florastor probiotic was recommended but was not affordable.  Her diarrhea completely resolved after she completed the prolonged course of vancomycin but recurred a few weeks ago.  She described passing 1-2 nonbloody watery diarrhea bowel movements daily.  She contacted her primary care physician Dr. ElizRachell Cipro she was prescribed Vancomycin 125 mg p.o. 4 times daily for 14 days with the same Vanco taper as noted above.  She is on her second week of Vancomycin 4 times daily dosing.  Her diarrhea has improved over the past few days.  She is passing softly formed stools.  No abdominal pain.  No bloody stools.  She continues to lose weight.  She was placed on Megace per her PCP which increased her appetite but did not wish to take it any longer so she stopped it.  Her daughter reported her PCP obtain blood work and repeat C. difficile stool studies, she will obtain a copy of these results for my review.  No nausea or vomiting.  She has a history of  reflux esophagitis, a nonbleeding gastric ulcer and reactive gastropathy with intestinal metaplasia identified per EGD 03/05/2021.  A EGD in 3 to 4 months was recommended to assess for ulcer healing and to perform gastric mapping secondary to intestinal metaplasia which has not been done.  She is no longer taking PPI.  She denies having any GERD symptoms.   IMAGE STUDIES:   CTAP 07/24/2022: 1. Acute uncomplicated diverticulitis of the sigmoid colon and distal descending colon. 2. Aortic atherosclerosis    CTAP 07/15/2022: 1. Moderate diverticular disease of the sigmoid. Query mild stranding about the descending/sigmoid junction in an area of diverticular change. Findings could represent very mild diverticulitis. 2. Foley catheter decompresses the urinary bladder. 3. No signs of urinary tract obstruction or calculi. 4. Aortic atherosclerosis.  Aortic Atherosclerosis    CTAP WO contrast 07/11/2022: FINDINGS: Lower chest: Scarring/atelectasis in the lower lobes. 4 mm pulmonary nodules in the right middle and lower lobe are unchanged from 2017 and should be benign.   Hepatobiliary: Hepatic steatosis. No suspicious focal hepatic lesion. Cholecystectomy. Mild dilation of the common bile duct likely related to reservoir effect post cholecystectomy.   Pancreas: Unremarkable. No pancreatic ductal dilatation or surrounding inflammatory changes.   Spleen: Normal in size without focal abnormality.   Adrenals/Urinary Tract: Adrenal glands are unremarkable. Kidneys are normal, without renal calculi, focal lesion, or hydronephrosis. Bladder is unremarkable.   Stomach/Bowel: Stomach is within normal limits. Appendix appears normal. No evidence of bowel wall thickening, distention, or inflammatory  changes. Diverticulosis without diverticulitis.   Vascular/Lymphatic: Aortoiliac atherosclerotic calcification. No suspicious adenopathy.   Reproductive: Unremarkable   Other: No free  intraperitoneal fluid or air.   Musculoskeletal: No acute or significant osseous findings.   IMPRESSION: No acute abnormality in the abdomen or pelvis.   Hepatic steatosis.   Diverticulosis without evidence diverticulitis.   Aortic Atherosclerosis    GI PROCEDURES:   EGD 03/05/2021: - White nummular lesions in esophageal mucosa. Biopsied. - Z-line regular, 38 cm from the incisors. - Non-bleeding gastric ulcer with a clean ulcer base (Forrest Class III). Gastritis. Biopsied. - No gross lesions in the duodenal bulb, in the first portion of the duodenum and in the second portion of the duodenum. Biopsied. -Repeat EGD in 3 to 4 months to assess for ulcer healing and to perform gastric mapping secondary to intestinal metaplasia   Colonoscopy 03/05/2021: - Hemorrhoids found on digital rectal exam. - Tortuous colon. - Stool in the entire examined colon - lavaged with adequate visualization. - One 5 mm polyp in the cecum, removed with a cold snare. Resected and retrieved. - Diverticulosis in the entire examined colon. - Normal mucosa in the entire examined colon otherwise. - Bleeding non-thrombosed prolapsed hemorrhoids.   1. Surgical [P], gastric - GASTRIC ANTRAL MUCOSA WITH NONSPECIFIC REACTIVE GASTROPATHY AND INTESTINAL METAPLASIA, NEGATIVE FOR DYSPLASIA - GASTRIC OXYNTIC MUCOSA WITH NO SPECIFIC HISTOPATHOLOGIC CHANGES - WARTHIN STARRY STAIN IS NEGATIVE FOR HELICOBACTER PYLORI 2. Surgical [P], duodenum - DUODENAL MUCOSA WITH NO SPECIFIC HISTOPATHOLOGIC CHANGES - NEGATIVE FOR INCREASED INTRAEPITHELIAL LYMPHOCYTES OR VILLOUS ARCHITECTURAL CHANGES 3. Surgical [P], esophagus - ESOPHAGEAL SQUAMOUS MUCOSA WITH MILD VASCULAR CONGESTION, AND FOCAL SQUAMOUS BALLOONING, SUGGESTIVE OF REFLUX ESOPHAGITIS - NEGATIVE FOR INCREASED INTRAEPITHELIAL EOSINOPHILS - PAS/F STAIN IS NEGATIVE FOR FUNGAL ORGANISMS 4. Surgical [P], colon, cecum, polyp (1) - POLYPOID COLONIC MUCOSA WITH A PROMINENT  LYMPHOID AGGREGATE - NEGATIVE FOR DYSPLASIA - MULTIPLE STEP SECTIONS WERE EXAMINED  Current Outpatient Medications on File Prior to Visit  Medication Sig Dispense Refill   acetaminophen (TYLENOL) 500 MG tablet Take 1,000 mg by mouth every 8 (eight) hours as needed for mild pain, headache or fever.     BAYER LOW DOSE 81 MG tablet Take 81 mg by mouth daily.     Calcium Carbonate (CALTRATE 600 PO) Take 600 mg by mouth daily.     Cholecalciferol (VITAMIN D3) 25 MCG (1000 UT) CAPS Take 1,000 Units by mouth daily.     Continuous Blood Gluc Receiver (FREESTYLE LIBRE 2 READER) DEVI See admin instructions.     Continuous Blood Gluc Sensor (FREESTYLE LIBRE 14 DAY SENSOR) MISC Inject 1 Device into the skin every 14 (fourteen) days.     folic acid (FOLVITE) 1 MG tablet Take 1 tablet (1 mg total) by mouth daily. 30 tablet 3   Glucagon HCl 1 MG SOLR Inject 1 mg into the skin as needed (for a blood sugar <67).     insulin degludec (TRESIBA) 200 UNIT/ML FlexTouch Pen Inject 30 Units into the skin See admin instructions. Inject 30 units into the skin daily before brunch     levothyroxine (SYNTHROID) 88 MCG tablet Take 88 mcg by mouth daily before breakfast.     NOVOLOG FLEXPEN 100 UNIT/ML FlexPen Inject 5-10 Units into the skin See admin instructions. Inject 5 units into the skin before meals if BGL is 100-150, 7 units if BGL is 151-200, and 10 units if BGL is greater than 200     sertraline (ZOLOFT) 50 MG tablet Take 150  mg by mouth at bedtime.     thiamine (VITAMIN B1) 100 MG tablet Take 1 tablet (100 mg total) by mouth daily. 30 tablet 0   TRELEGY ELLIPTA 200-62.5-25 MCG/ACT AEPB Inhale 1 puff into the lungs daily as needed (for flares).     vancomycin (VANCOCIN) 125 MG capsule Take by mouth.     amLODipine (NORVASC) 5 MG tablet Take 1 tablet (5 mg total) by mouth daily. (Patient not taking: Reported on 10/18/2022) 30 tablet 0   buPROPion (WELLBUTRIN XL) 150 MG 24 hr tablet Take 150 mg by mouth daily.  (Patient not taking: Reported on 10/18/2022)     cloNIDine (CATAPRES - DOSED IN MG/24 HR) 0.1 mg/24hr patch Place 1 patch (0.1 mg total) onto the skin once a week. (Patient not taking: Reported on 10/18/2022) 4 patch 12   Cyanocobalamin (VITAMIN B-12) 1000 MCG SUBL Place 1 tablet under the tongue daily. (Patient not taking: Reported on 10/18/2022)     cyanocobalamin 1000 MCG tablet Take 0.5 tablets (500 mcg total) by mouth daily. (Patient not taking: Reported on 10/18/2022) 15 tablet 3   nitroGLYCERIN (NITROSTAT) 0.4 MG SL tablet Place 1 tablet (0.4 mg total) under the tongue every 5 (five) minutes as needed for chest pain. 30 tablet 3   No current facility-administered medications on file prior to visit.   Allergies  Allergen Reactions   Latex Anaphylaxis, Shortness Of Breath and Other (See Comments)    Severe respiratory distress   Onion Diarrhea   Other Other (See Comments)    Patient cannot have TREE NUTS and anything with seeds- History of  diverticulitis    Flexeril [Cyclobenzaprine] Other (See Comments)    Caused agitation   Lisinopril Cough   Metformin And Related Diarrhea   Simvastatin Hives and Rash   Tetracycline Rash   Zithromax [Azithromycin] Rash   Current Medications, Allergies, Past Medical History, Past Surgical History, Family History and Social History were reviewed in [employer] record.  Review of Systems:   Constitutional: + Weight loss, no fevers.  Respiratory: Negative for shortness of breath.   Cardiovascular: Negative for chest pain, palpitations and leg swelling.  Gastrointestinal: See HPI.  Musculoskeletal: Negative for back pain or muscle aches.  Neurological: Negative for dizziness, headaches or paresthesias.   Physical Exam: BP 120/68   Pulse 100   Ht _0  (1.651 m)   Wt 138 lb 6.4 oz (62.8 kg)   SpO2 98%   BMI 23.03 kg/m   Wt Readings from Last 3 Encounters:  10/18/22 138 lb 6.4 oz (62.8 kg)  09/12/22 140 lb 3.4 oz  (63.6 kg)  08/09/22 143 lb (64.9 kg)    General: Elderly 86 year old female in no acute distress. Head: Normocephalic and atraumatic. Eyes: No scleral icterus. Conjunctiva pink . Ears: Normal auditory acuity. Mouth: Dentition intact. No ulcers or lesions.  Lungs: Clear throughout to auscultation. Heart: Regular rate and rhythm, no murmur. Abdomen: Soft, nontender and nondistended. No masses or hepatomegaly. Normal bowel sounds x 4 quadrants.  Rectal: Deferred.  Musculoskeletal: Symmetrical with no gross deformities. Extremities: No edema. Neurological: Alert oriented x 4. No focal deficits.  Psychological: Alert and cooperative. Normal mood and affect  Assessment and Recommendations:  39) 86 year old female with recurrent sigmoid diverticulitis 06/2022 who subsequently developed C. difficile colitis treated with Vancomycin 125 mg 4 times daily for 14 days then Vancomycin 130m one po tid x 7 days then 1272mone po bid x 7 days then 12527mne  po once daily x 7 days her diarrhea abated.  However, she had recurrent nonbloody watery diarrhea which started a few weeks ago.  She was restarted on Vancomycin 125 mg 4 times daily 1 week ago which also included repeating the previously prescribed Vancomycin taper as noted above.  She is currently week #2 on Vancomycin 125 mg 4 times daily.  Her diarrhea has significantly improved as she is passing 1-2 soft stools for the past 2 to 3 days.  No abdominal pain. -Daughter to bring in a copy of recent blood test and C. difficile stool test from PCPs office as these results are not available in epic or Care Everywhere -Continue Vancomycin as prescribed by Dr. Ernie Hew. I will consider adding additional Vancomycin taper after she completes the above course, after she is down to Vancomycin 172m one po QD x 7 days I will add Vancomycin 1258mone po every 3 days x 6 weeks. -She will require Dificid if she has recurrent or worsening C diff diarrhea -Consider Florastor  p.o. twice daily x 2 weeks if affordable   3) Weight loss, continued decreased oral intake -Recommend restarting Megace as ordered by PCP as she initially responded well to this treatment -Continue follow-up with PCP  4) Reflux esophagitis. EGD 03/05/2021 identified reflux esophagitis, non bleeding gastric ulcer, reactive gastropathy with intestinal metaplasia. A EGD in 3 to 4 months was recommended to assess for ulcer healing and to perform gastric mapping secondary to intestinal metaplasia which has not been done.  -Dr. MaRush Landmarko verify if future EGD warranted at this juncture  LABS 7/08//2023 RECEIVED: Hemoglobin A1c 7.4.  TSH 3.390.  WBC 7.4.  Hemoglobin 12.3.  Hematocrit 37.1.  MCV 89.  Platelet 284.  Glucose 193.  BUN 25.  Creatinine 2.11.  Sodium 140.  Potassium 4.0.  Calcium 9.8.  Albumin 4.2.  Total bili 0.2.  Alk phos 75.  AST 23.  ALT 22.  Vitamin D 76.7.  BNP 8.8.

## 2022-10-18 NOTE — Patient Instructions (Signed)
Continue C-diff taper for now.  Recommend Megace as reccommended by your primary doctor.  Thank you for trusting me with your gastrointestinal care!   Carl Best, CRNP

## 2022-10-19 DIAGNOSIS — I10 Essential (primary) hypertension: Secondary | ICD-10-CM | POA: Diagnosis not present

## 2022-10-19 DIAGNOSIS — E1165 Type 2 diabetes mellitus with hyperglycemia: Secondary | ICD-10-CM | POA: Diagnosis not present

## 2022-10-19 DIAGNOSIS — R634 Abnormal weight loss: Secondary | ICD-10-CM | POA: Diagnosis not present

## 2022-10-19 NOTE — Progress Notes (Signed)
Attending Physician's Attestation   I have reviewed the chart.   I agree with the Advanced Practitioner's note, impression, and recommendations with any updates as below. Lets hope that the patient is able to get through this recurrent bout of potential C. difficile infection as you have outlined with the vancomycin taper.  Agree any other recurrences in the future should be considered for Dificid and if further bouts then [place].  Upper endoscopy is not unreasonable but we can defer that to early next year so that at follow-up we can work on scheduling that as able.   Justice Britain, MD [facility] Advanced Endoscopy Office # [phone]

## 2022-10-20 DIAGNOSIS — I69354 Hemiplegia and hemiparesis following cerebral infarction affecting left non-dominant side: Secondary | ICD-10-CM | POA: Diagnosis not present

## 2022-10-20 DIAGNOSIS — E1122 Type 2 diabetes mellitus with diabetic chronic kidney disease: Secondary | ICD-10-CM | POA: Diagnosis not present

## 2022-10-20 DIAGNOSIS — A0472 Enterocolitis due to Clostridium difficile, not specified as recurrent: Secondary | ICD-10-CM | POA: Diagnosis not present

## 2022-10-20 DIAGNOSIS — M19012 Primary osteoarthritis, left shoulder: Secondary | ICD-10-CM | POA: Diagnosis not present

## 2022-10-20 DIAGNOSIS — I129 Hypertensive chronic kidney disease with stage 1 through stage 4 chronic kidney disease, or unspecified chronic kidney disease: Secondary | ICD-10-CM | POA: Diagnosis not present

## 2022-10-20 DIAGNOSIS — K5732 Diverticulitis of large intestine without perforation or abscess without bleeding: Secondary | ICD-10-CM | POA: Diagnosis not present

## 2022-10-23 DIAGNOSIS — Z7951 Long term (current) use of inhaled steroids: Secondary | ICD-10-CM | POA: Diagnosis not present

## 2022-10-23 DIAGNOSIS — H9193 Unspecified hearing loss, bilateral: Secondary | ICD-10-CM | POA: Diagnosis not present

## 2022-10-23 DIAGNOSIS — Z794 Long term (current) use of insulin: Secondary | ICD-10-CM | POA: Diagnosis not present

## 2022-10-23 DIAGNOSIS — I10 Essential (primary) hypertension: Secondary | ICD-10-CM | POA: Diagnosis not present

## 2022-10-23 DIAGNOSIS — F32A Depression, unspecified: Secondary | ICD-10-CM | POA: Diagnosis not present

## 2022-10-23 DIAGNOSIS — E039 Hypothyroidism, unspecified: Secondary | ICD-10-CM | POA: Diagnosis not present

## 2022-10-23 DIAGNOSIS — E519 Thiamine deficiency, unspecified: Secondary | ICD-10-CM | POA: Diagnosis not present

## 2022-10-23 DIAGNOSIS — K8020 Calculus of gallbladder without cholecystitis without obstruction: Secondary | ICD-10-CM | POA: Diagnosis not present

## 2022-10-23 DIAGNOSIS — N1832 Chronic kidney disease, stage 3b: Secondary | ICD-10-CM | POA: Diagnosis not present

## 2022-10-23 DIAGNOSIS — F5109 Other insomnia not due to a substance or known physiological condition: Secondary | ICD-10-CM | POA: Diagnosis not present

## 2022-10-23 DIAGNOSIS — H409 Unspecified glaucoma: Secondary | ICD-10-CM | POA: Diagnosis not present

## 2022-10-23 DIAGNOSIS — E785 Hyperlipidemia, unspecified: Secondary | ICD-10-CM | POA: Diagnosis not present

## 2022-10-23 DIAGNOSIS — E559 Vitamin D deficiency, unspecified: Secondary | ICD-10-CM | POA: Diagnosis not present

## 2022-10-23 DIAGNOSIS — Z7982 Long term (current) use of aspirin: Secondary | ICD-10-CM | POA: Diagnosis not present

## 2022-10-23 DIAGNOSIS — J4489 Other specified chronic obstructive pulmonary disease: Secondary | ICD-10-CM | POA: Diagnosis not present

## 2022-10-23 DIAGNOSIS — E538 Deficiency of other specified B group vitamins: Secondary | ICD-10-CM | POA: Diagnosis not present

## 2022-10-23 DIAGNOSIS — M79602 Pain in left arm: Secondary | ICD-10-CM | POA: Diagnosis not present

## 2022-10-23 DIAGNOSIS — K760 Fatty (change of) liver, not elsewhere classified: Secondary | ICD-10-CM | POA: Diagnosis not present

## 2022-10-23 DIAGNOSIS — D631 Anemia in chronic kidney disease: Secondary | ICD-10-CM | POA: Diagnosis not present

## 2022-10-23 DIAGNOSIS — E1142 Type 2 diabetes mellitus with diabetic polyneuropathy: Secondary | ICD-10-CM | POA: Diagnosis not present

## 2022-10-23 DIAGNOSIS — E1122 Type 2 diabetes mellitus with diabetic chronic kidney disease: Secondary | ICD-10-CM | POA: Diagnosis not present

## 2022-10-23 DIAGNOSIS — G9341 Metabolic encephalopathy: Secondary | ICD-10-CM | POA: Diagnosis not present

## 2022-10-23 DIAGNOSIS — M1990 Unspecified osteoarthritis, unspecified site: Secondary | ICD-10-CM | POA: Diagnosis not present

## 2022-10-23 DIAGNOSIS — F419 Anxiety disorder, unspecified: Secondary | ICD-10-CM | POA: Diagnosis not present

## 2022-10-23 DIAGNOSIS — G8929 Other chronic pain: Secondary | ICD-10-CM | POA: Diagnosis not present

## 2022-10-26 DIAGNOSIS — E1142 Type 2 diabetes mellitus with diabetic polyneuropathy: Secondary | ICD-10-CM | POA: Diagnosis not present

## 2022-10-26 DIAGNOSIS — E039 Hypothyroidism, unspecified: Secondary | ICD-10-CM | POA: Diagnosis not present

## 2022-10-26 DIAGNOSIS — G9341 Metabolic encephalopathy: Secondary | ICD-10-CM | POA: Diagnosis not present

## 2022-10-26 DIAGNOSIS — F419 Anxiety disorder, unspecified: Secondary | ICD-10-CM | POA: Diagnosis not present

## 2022-10-26 DIAGNOSIS — E785 Hyperlipidemia, unspecified: Secondary | ICD-10-CM | POA: Diagnosis not present

## 2022-10-26 DIAGNOSIS — N1832 Chronic kidney disease, stage 3b: Secondary | ICD-10-CM | POA: Diagnosis not present

## 2022-10-26 DIAGNOSIS — I10 Essential (primary) hypertension: Secondary | ICD-10-CM | POA: Diagnosis not present

## 2022-10-26 DIAGNOSIS — E1122 Type 2 diabetes mellitus with diabetic chronic kidney disease: Secondary | ICD-10-CM | POA: Diagnosis not present

## 2022-10-26 DIAGNOSIS — E538 Deficiency of other specified B group vitamins: Secondary | ICD-10-CM | POA: Diagnosis not present

## 2022-10-26 DIAGNOSIS — D631 Anemia in chronic kidney disease: Secondary | ICD-10-CM | POA: Diagnosis not present

## 2022-10-26 DIAGNOSIS — E559 Vitamin D deficiency, unspecified: Secondary | ICD-10-CM | POA: Diagnosis not present

## 2022-10-26 DIAGNOSIS — J4489 Other specified chronic obstructive pulmonary disease: Secondary | ICD-10-CM | POA: Diagnosis not present

## 2022-10-27 NOTE — Progress Notes (Signed)
Reschedule 11/16/22  [place]  Direct Dial: [phone]

## 2022-10-28 DIAGNOSIS — G9341 Metabolic encephalopathy: Secondary | ICD-10-CM | POA: Diagnosis not present

## 2022-10-28 DIAGNOSIS — E1122 Type 2 diabetes mellitus with diabetic chronic kidney disease: Secondary | ICD-10-CM | POA: Diagnosis not present

## 2022-10-28 DIAGNOSIS — E1142 Type 2 diabetes mellitus with diabetic polyneuropathy: Secondary | ICD-10-CM | POA: Diagnosis not present

## 2022-10-28 DIAGNOSIS — I10 Essential (primary) hypertension: Secondary | ICD-10-CM | POA: Diagnosis not present

## 2022-10-28 DIAGNOSIS — D631 Anemia in chronic kidney disease: Secondary | ICD-10-CM | POA: Diagnosis not present

## 2022-10-28 DIAGNOSIS — N1832 Chronic kidney disease, stage 3b: Secondary | ICD-10-CM | POA: Diagnosis not present

## 2022-11-01 ENCOUNTER — Other Ambulatory Visit (HOSPITAL_COMMUNITY): Payer: Self-pay

## 2022-11-01 ENCOUNTER — Telehealth: Payer: Self-pay | Admitting: Nurse Practitioner

## 2022-11-01 ENCOUNTER — Other Ambulatory Visit: Payer: Self-pay

## 2022-11-01 DIAGNOSIS — Z8619 Personal history of other infectious and parasitic diseases: Secondary | ICD-10-CM

## 2022-11-01 DIAGNOSIS — A0472 Enterocolitis due to Clostridium difficile, not specified as recurrent: Secondary | ICD-10-CM

## 2022-11-01 DIAGNOSIS — R197 Diarrhea, unspecified: Secondary | ICD-10-CM

## 2022-11-01 MED ORDER — VANCOMYCIN HCL 125 MG PO CAPS
ORAL_CAPSULE | ORAL | 0 refills | Status: DC
  Filled 2022-11-01: qty 18, fill #0

## 2022-11-01 MED ORDER — VANCOMYCIN HCL 125 MG PO CAPS
ORAL_CAPSULE | ORAL | 0 refills | Status: DC
  Filled 2022-11-01 – 2022-11-15 (×2): qty 18, 54d supply, fill #0

## 2022-11-01 NOTE — Telephone Encounter (Signed)
Diana Harper, refer to office visit 10/18/2022. Pls obtain symptoms update re [name]. Refer to prior Vanco taper instruction. When she finishes the last step of the taper -> Vancomycin '125mg'$  one po QD x 7 days then please add Vancomycin '125mg'$  one cap po every 3 days x 6 weeks dispense # 18. THX.

## 2022-11-01 NOTE — Telephone Encounter (Signed)
Spoke with pt daughter Theophilus Kinds stated that the pt is doimng a lot better, No diarrhea, No mucus, Normal BM pattern, Formed stools: UTI was negative: [name] made aware of Carl Best NP recommendations:  Prescription was sent to pharmacy: [name] made aware: [name]  verbalized understanding with all questions answered.

## 2022-11-02 DIAGNOSIS — D631 Anemia in chronic kidney disease: Secondary | ICD-10-CM | POA: Diagnosis not present

## 2022-11-02 DIAGNOSIS — E1142 Type 2 diabetes mellitus with diabetic polyneuropathy: Secondary | ICD-10-CM | POA: Diagnosis not present

## 2022-11-02 DIAGNOSIS — E1122 Type 2 diabetes mellitus with diabetic chronic kidney disease: Secondary | ICD-10-CM | POA: Diagnosis not present

## 2022-11-02 DIAGNOSIS — N1832 Chronic kidney disease, stage 3b: Secondary | ICD-10-CM | POA: Diagnosis not present

## 2022-11-02 DIAGNOSIS — I10 Essential (primary) hypertension: Secondary | ICD-10-CM | POA: Diagnosis not present

## 2022-11-02 DIAGNOSIS — G9341 Metabolic encephalopathy: Secondary | ICD-10-CM | POA: Diagnosis not present

## 2022-11-04 ENCOUNTER — Ambulatory Visit: Payer: Medicare Other | Admitting: Nurse Practitioner

## 2022-11-05 DIAGNOSIS — Z1339 Encounter for screening examination for other mental health and behavioral disorders: Secondary | ICD-10-CM | POA: Diagnosis not present

## 2022-11-05 DIAGNOSIS — Z0000 Encounter for general adult medical examination without abnormal findings: Secondary | ICD-10-CM | POA: Diagnosis not present

## 2022-11-05 DIAGNOSIS — R32 Unspecified urinary incontinence: Secondary | ICD-10-CM | POA: Diagnosis not present

## 2022-11-05 DIAGNOSIS — G9341 Metabolic encephalopathy: Secondary | ICD-10-CM | POA: Diagnosis not present

## 2022-11-05 DIAGNOSIS — Z1331 Encounter for screening for depression: Secondary | ICD-10-CM | POA: Diagnosis not present

## 2022-11-05 DIAGNOSIS — E1142 Type 2 diabetes mellitus with diabetic polyneuropathy: Secondary | ICD-10-CM | POA: Diagnosis not present

## 2022-11-05 DIAGNOSIS — E1122 Type 2 diabetes mellitus with diabetic chronic kidney disease: Secondary | ICD-10-CM | POA: Diagnosis not present

## 2022-11-05 DIAGNOSIS — D631 Anemia in chronic kidney disease: Secondary | ICD-10-CM | POA: Diagnosis not present

## 2022-11-05 DIAGNOSIS — N1832 Chronic kidney disease, stage 3b: Secondary | ICD-10-CM | POA: Diagnosis not present

## 2022-11-05 DIAGNOSIS — I10 Essential (primary) hypertension: Secondary | ICD-10-CM | POA: Diagnosis not present

## 2022-11-05 DIAGNOSIS — Z9181 History of falling: Secondary | ICD-10-CM | POA: Diagnosis not present

## 2022-11-10 ENCOUNTER — Emergency Department (HOSPITAL_COMMUNITY)
Admission: EM | Admit: 2022-11-10 | Discharge: 2022-11-10 | Disposition: A | Discharge status: Home / Self Care | Payer: Medicare Other | Attending: Student | Admitting: Student

## 2022-11-10 DIAGNOSIS — I10 Essential (primary) hypertension: Secondary | ICD-10-CM | POA: Diagnosis not present

## 2022-11-10 DIAGNOSIS — I639 Cerebral infarction, unspecified: Secondary | ICD-10-CM | POA: Diagnosis not present

## 2022-11-10 DIAGNOSIS — Z794 Long term (current) use of insulin: Secondary | ICD-10-CM | POA: Diagnosis not present

## 2022-11-10 DIAGNOSIS — E1122 Type 2 diabetes mellitus with diabetic chronic kidney disease: Secondary | ICD-10-CM | POA: Diagnosis not present

## 2022-11-10 DIAGNOSIS — Z91040 Latex allergy status: Secondary | ICD-10-CM | POA: Diagnosis not present

## 2022-11-10 DIAGNOSIS — N184 Chronic kidney disease, stage 4 (severe): Secondary | ICD-10-CM | POA: Diagnosis not present

## 2022-11-10 DIAGNOSIS — R4182 Altered mental status, unspecified: Secondary | ICD-10-CM | POA: Diagnosis not present

## 2022-11-10 DIAGNOSIS — J45909 Unspecified asthma, uncomplicated: Secondary | ICD-10-CM | POA: Diagnosis not present

## 2022-11-10 DIAGNOSIS — I129 Hypertensive chronic kidney disease with stage 1 through stage 4 chronic kidney disease, or unspecified chronic kidney disease: Secondary | ICD-10-CM | POA: Insufficient documentation

## 2022-11-10 DIAGNOSIS — E1165 Type 2 diabetes mellitus with hyperglycemia: Secondary | ICD-10-CM | POA: Diagnosis not present

## 2022-11-10 DIAGNOSIS — Z79899 Other long term (current) drug therapy: Secondary | ICD-10-CM | POA: Diagnosis not present

## 2022-11-10 DIAGNOSIS — R300 Dysuria: Secondary | ICD-10-CM | POA: Diagnosis not present

## 2022-11-10 DIAGNOSIS — N3000 Acute cystitis without hematuria: Secondary | ICD-10-CM | POA: Diagnosis not present

## 2022-11-10 DIAGNOSIS — F0390 Unspecified dementia without behavioral disturbance: Secondary | ICD-10-CM | POA: Diagnosis not present

## 2022-11-10 DIAGNOSIS — J449 Chronic obstructive pulmonary disease, unspecified: Secondary | ICD-10-CM | POA: Diagnosis not present

## 2022-11-10 LAB — URINALYSIS, ROUTINE W REFLEX MICROSCOPIC
Bacteria, UA: NONE SEEN
Bilirubin Urine: NEGATIVE
Glucose, UA: NEGATIVE mg/dL
Hgb urine dipstick: NEGATIVE
Ketones, ur: NEGATIVE mg/dL
Nitrite: NEGATIVE
Protein, ur: 30 mg/dL — AB
Specific Gravity, Urine: 1.004 — ABNORMAL LOW (ref 1.005–1.030)
pH: 7 (ref 5.0–8.0)

## 2022-11-10 LAB — CBC WITH DIFFERENTIAL/PLATELET
Abs Immature Granulocytes: 0.01 10*3/uL (ref 0.00–0.07)
Basophils Absolute: 0.1 10*3/uL (ref 0.0–0.1)
Basophils Relative: 1 %
Eosinophils Absolute: 0.2 10*3/uL (ref 0.0–0.5)
Eosinophils Relative: 2 %
HCT: 34.3 % — ABNORMAL LOW (ref 36.0–46.0)
Hemoglobin: 11.1 g/dL — ABNORMAL LOW (ref 12.0–15.0)
Immature Granulocytes: 0 %
Lymphocytes Relative: 38 %
Lymphs Abs: 2.5 10*3/uL (ref 0.7–4.0)
MCH: 28.6 pg (ref 26.0–34.0)
MCHC: 32.4 g/dL (ref 30.0–36.0)
MCV: 88.4 fL (ref 80.0–100.0)
Monocytes Absolute: 0.4 10*3/uL (ref 0.1–1.0)
Monocytes Relative: 6 %
Neutro Abs: 3.5 10*3/uL (ref 1.7–7.7)
Neutrophils Relative %: 53 %
Platelets: 237 10*3/uL (ref 150–400)
RBC: 3.88 MIL/uL (ref 3.87–5.11)
RDW: 14.4 % (ref 11.5–15.5)
WBC: 6.6 10*3/uL (ref 4.0–10.5)
nRBC: 0 % (ref 0.0–0.2)

## 2022-11-10 LAB — COMPREHENSIVE METABOLIC PANEL
ALT: 28 U/L (ref 0–44)
AST: 24 U/L (ref 15–41)
Albumin: 3.3 g/dL — ABNORMAL LOW (ref 3.5–5.0)
Alkaline Phosphatase: 53 U/L (ref 38–126)
Anion gap: 6 (ref 5–15)
BUN: 21 mg/dL (ref 8–23)
CO2: 28 mmol/L (ref 22–32)
Calcium: 9.7 mg/dL (ref 8.9–10.3)
Chloride: 108 mmol/L (ref 98–111)
Creatinine, Ser: 1.44 mg/dL — ABNORMAL HIGH (ref 0.44–1.00)
GFR, Estimated: 36 mL/min — ABNORMAL LOW (ref >60)
Glucose, Bld: 162 mg/dL — ABNORMAL HIGH (ref 70–99)
Potassium: 4 mmol/L (ref 3.5–5.1)
Sodium: 142 mmol/L (ref 135–145)
Total Bilirubin: 0.4 mg/dL (ref 0.3–1.2)
Total Protein: 6.7 g/dL (ref 6.5–8.1)

## 2022-11-10 LAB — VITAMIN B12: Vitamin B-12: 844 pg/mL (ref 180–914)

## 2022-11-10 MED ORDER — FOSFOMYCIN TROMETHAMINE 3 G PO PACK
3.0000 g | PACK | Freq: Once | ORAL | Status: AC
  Administered 2022-11-10: 3 g via ORAL
  Filled 2022-11-10: qty 3

## 2022-11-10 MED ORDER — LACTATED RINGERS IV BOLUS
1000.0000 mL | Freq: Once | INTRAVENOUS | Status: AC
  Administered 2022-11-10: 1000 mL via INTRAVENOUS

## 2022-11-10 NOTE — ED Triage Notes (Signed)
Pt BIBA from home for confusion, started having some incoherent speech and agitation on Saturday which happens when she has a UTI, dx with UTI today. No slurred speech, facial droop, or unilateral weakness noted. Denies abdominal pain. Normally A&Ox4, confused on date today.  150 palp HR 82 100% RA CBG 194 hx dm

## 2022-11-10 NOTE — ED Provider Notes (Signed)
[facility] DEPT Provider Note  CSN: [id number] Arrival date & time: 11/10/22 1103  Chief Complaint(s) Altered Mental Status and Dysuria  HPI Diana Harper is a 86 y.o. female with PMH CVA, T2DM, HTN, C. difficile colitis and severe thiamine and B12 deficiency who presents emergency department for evaluation of dark urine and concern for UTI.  History obtained from patient and patient's daughters who states that she had a routine video visit follow-up today for her C. difficile colitis and her primary care physician was worried that she may be having urosepsis and patient was sent to the emergency department for further evaluation.  Per family, somebody over the video looked at the urine on the camera and there was some questionable altered mental status and thus she was transferred to the ER for further evaluation.  Here in the emergency department, patient is alert and oriented answering questions appropriately and denies any symptoms today.  Denies chest pain, shortness of breath, headache, fever, abdominal pain or other systemic symptoms.   Past Medical History Past Medical History:  Diagnosis Date   Anemia    Anxiety    Arthritis    Asthma    Biliary colic    C. difficile colitis 08/2022   Cholelithiasis    CKD (chronic kidney disease), stage III [facility])    nephrologist--  dr Meredeth Ide   Colon polyps    Diverticulosis of colon    Gastric ulcer    Glaucoma    History of asthma    1980's  no longer problem since 1980's   History of diverticulitis of colon    recurrent--  2014;  2013;  2012   Hyperlipidemia    Hypertension    Hypothyroidism    Insulin dependent type 2 diabetes mellitus ([place])    followed by dr Jenny Reichmann lambeth ([facility])    Murmur    Peripheral neuropathy    Stroke [facility])    Vitamin D deficiency    Wears hearing aid    bilateral   Wears partial dentures    lower partial and upper full   Patient Active Problem List   Diagnosis  Date Noted   Acute metabolic encephalopathy 56/25/6389   Vitamin B12 deficiency 09/15/2022   Thiamine deficiency 09/15/2022   Back pain 09/15/2022   CKD (chronic kidney disease), stage IV ([place]) 09/15/2022   Hypokalemia 09/15/2022   AMS (altered mental status) 09/12/2022   C. difficile colitis 07/25/2022   Normocytic anemia 07/24/2022   Uncontrolled type 2 diabetes mellitus with hyperglycemia, without long-term current use of insulin ([place]) 07/17/2022   Acute diverticulitis 04/06/2021   CKD stage 3 due to type 2 diabetes mellitus ([place]) 04/06/2021   Essential hypertension 04/06/2021   Hypothyroidism    Depression    COPD (chronic obstructive pulmonary disease) ([place])    Anorexia 02/11/2021   Bloating symptom 02/11/2021   History of colon polyps 02/09/2021   Hemorrhoids 02/09/2021   Rectal bleeding 02/09/2021   Fecal smearing 02/09/2021   AKI (acute kidney injury) ([place]) 12/13/2019   Acute kidney injury ([place]) 12/12/2019   Diarrhea 12/12/2019   Dehydration 12/12/2019   Cerebral thrombosis with cerebral infarction 10/10/2017   Cerebral embolism with cerebral infarction 10/10/2017   Subarachnoid hemorrhage 10/10/2017   Intracerebral hemorrhage 10/10/2017   CVA (cerebral vascular accident) ([place]) 10/10/2017   Chronic cholecystitis with calculus 08/25/2016   Syncope 11/15/2011   Diverticulitis 11/15/2011   Home Medication(s) Prior to Admission medications   Medication Sig Start Date End Date Taking?  Authorizing Provider  acetaminophen (TYLENOL) 500 MG tablet Take 1,000 mg by mouth every 8 (eight) hours as needed for mild pain, headache or fever.    [provider]  amLODipine (NORVASC) 5 MG tablet Take 1 tablet (5 mg total) by mouth daily. Patient not taking: Reported on 10/18/2022 09/16/22   Charlynne Cousins, MD  BAYER LOW DOSE 81 MG tablet Take 81 mg by mouth daily.    [provider]  buPROPion (WELLBUTRIN XL) 150 MG 24 hr tablet Take 150 mg by mouth  daily. Patient not taking: Reported on 10/18/2022 04/05/22   [provider]  Calcium Carbonate (CALTRATE 600 PO) Take 600 mg by mouth daily.    [provider]  Cholecalciferol (VITAMIN D3) 25 MCG (1000 UT) CAPS Take 1,000 Units by mouth daily.    [provider]  cloNIDine (CATAPRES - DOSED IN MG/24 HR) 0.1 mg/24hr patch Place 1 patch (0.1 mg total) onto the skin once a week. Patient not taking: Reported on 10/18/2022 09/21/22   Charlynne Cousins, MD  Continuous Blood Gluc Receiver (FREESTYLE LIBRE 2 READER) [place] See admin instructions. 10/13/22   [provider]  Continuous Blood Gluc Sensor (FREESTYLE LIBRE 14 DAY SENSOR) MISC Inject 1 Device into the skin every 14 (fourteen) days.    [provider]  Cyanocobalamin (VITAMIN B-12) 1000 MCG SUBL Place 1 tablet under the tongue daily. Patient not taking: Reported on 10/18/2022    [provider]  cyanocobalamin 1000 MCG tablet Take 0.5 tablets (500 mcg total) by mouth daily. Patient not taking: Reported on 10/18/2022 09/16/22   Charlynne Cousins, MD  folic acid (FOLVITE) 1 MG tablet Take 1 tablet (1 mg total) by mouth daily. 09/16/22   Charlynne Cousins, MD  Glucagon HCl 1 MG SOLR Inject 1 mg into the skin as needed (for a blood sugar <67).    [provider]  insulin degludec (TRESIBA) 200 UNIT/ML FlexTouch Pen Inject 30 Units into the skin See admin instructions. Inject 30 units into the skin daily before brunch    [provider]  levothyroxine (SYNTHROID) 88 MCG tablet Take 88 mcg by mouth daily before breakfast.    [provider]  nitroGLYCERIN (NITROSTAT) 0.4 MG SL tablet Place 1 tablet (0.4 mg total) under the tongue every 5 (five) minutes as needed for chest pain. 01/20/21 09/12/22  [place], Celeste C, PA-C  NOVOLOG FLEXPEN 100 UNIT/ML FlexPen Inject 5-10 Units into the skin See admin instructions. Inject 5 units into the skin before meals if BGL is  100-150, 7 units if BGL is 151-200, and 10 units if BGL is greater than 200 05/21/22   [provider]  sertraline (ZOLOFT) 50 MG tablet Take 150 mg by mouth at bedtime.    [provider]  thiamine (VITAMIN B1) 100 MG tablet Take 1 tablet (100 mg total) by mouth daily. 09/15/22   Charlynne Cousins, MD  TRELEGY ELLIPTA 200-62.5-25 MCG/ACT AEPB Inhale 1 puff into the lungs daily as needed (for flares).    [provider]  vancomycin (VANCOCIN) 125 MG capsule Take by mouth. 10/12/22   [provider]  vancomycin (VANCOCIN) 125 MG capsule Take 1 tablet (125 mg) by mouth every 3 days for 6 weeks: 11/01/22   Noralyn Pick, NP  Past Surgical History Past Surgical History:  Procedure Laterality Date   ABDOMINAL HYSTERECTOMY  1970's   CARDIOVASCULAR STRESS TEST  07/30/2009   normal exercise lexiscan nuclear study w/ no ischemia/  normal LV funciton and wall motion, ef 77%   CATARACT EXTRACTION W/ INTRAOCULAR LENS  IMPLANT, BILATERAL  2014   LAPAROSCOPIC CHOLECYSTECTOMY SINGLE SITE WITH INTRAOPERATIVE CHOLANGIOGRAM N/A 08/25/2016   Procedure: LAPAROSCOPIC CHOLECYSTECTOMY WITH INTRAOPERATIVE CHOLANGIOGRAM;  Surgeon: Arta Bruce Kinsinger, MD;  Location: [facility];  Service: General;  Laterality: N/A;   LUMBAR SPINE SURGERY  2014   REMOVAL AXILLA CYST Right 69's   Family History Family History  Problem Relation Age of Onset   Stroke Mother    Colon polyps Mother    Diabetes Mother    Diabetes Father    Kidney failure Father    COPD Father    Colon polyps Sister    Diabetes Sister    Stroke Brother    Diabetes Brother    Heart attack Maternal Grandmother    Heart disease Maternal Grandfather    Diabetes Paternal Grandmother    Stroke Paternal Grandfather    Hypertension Paternal Grandfather     Diabetes Sister    Diabetes Sister    Diabetes Son    Diabetes Daughter    Colon cancer Neg Hx    Esophageal cancer Neg Hx    Inflammatory bowel disease Neg Hx    Liver disease Neg Hx    Pancreatic cancer Neg Hx    Rectal cancer Neg Hx    Stomach cancer Neg Hx     Social History Social History   Tobacco Use   Smoking status: Never   Smokeless tobacco: Never  Vaping Use   Vaping Use: Never used  Substance Use Topics   Alcohol use: No   Drug use: No   Allergies Latex, Onion, Other, Flexeril [cyclobenzaprine], Lisinopril, Metformin and related, Simvastatin, Tetracycline, and Zithromax [azithromycin]  Review of Systems Review of Systems  Genitourinary:        Dark urine    Physical Exam Vital Signs  I have reviewed the triage vital signs BP 121/81 (BP Location: Left Arm)   Pulse 88   Temp 98.2 F (36.8 C) (Oral)   Resp 18   SpO2 100%   Physical Exam Vitals and nursing note reviewed.  Constitutional:      General: She is not in acute distress.    Appearance: She is well-developed.  HENT:     Head: Normocephalic and atraumatic.  Eyes:     Conjunctiva/sclera: Conjunctivae normal.  Cardiovascular:     Rate and Rhythm: Normal rate and regular rhythm.     Heart sounds: No murmur heard. Pulmonary:     Effort: Pulmonary effort is normal. No respiratory distress.     Breath sounds: Normal breath sounds.  Abdominal:     Palpations: Abdomen is soft.     Tenderness: There is no abdominal tenderness.  Musculoskeletal:        General: No swelling.     Cervical back: Neck supple.  Skin:    General: Skin is warm and dry.     Capillary Refill: Capillary refill takes less than 2 seconds.  Neurological:     Mental Status: She is alert and oriented to person, place, and time.     Cranial Nerves: No cranial nerve deficit.     Sensory: No sensory deficit.     Motor: No weakness.  Psychiatric:  Mood and Affect: Mood normal.     ED Results and  Treatments Labs (all labs ordered are listed, but only abnormal results are displayed) Labs Reviewed  COMPREHENSIVE METABOLIC PANEL - Abnormal; Notable for the following components:      Result Value   Glucose, Bld 162 (*)    Creatinine, Ser 1.44 (*)    Albumin 3.3 (*)    GFR, Estimated 36 (*)    All other components within normal limits  CBC WITH DIFFERENTIAL/PLATELET - Abnormal; Notable for the following components:   Hemoglobin 11.1 (*)    HCT 34.3 (*)    All other components within normal limits  URINALYSIS, ROUTINE W REFLEX MICROSCOPIC - Abnormal; Notable for the following components:   Color, Urine COLORLESS (*)    Specific Gravity, Urine 1.004 (*)    Protein, ur 30 (*)    Leukocytes,Ua TRACE (*)    All other components within normal limits  URINE CULTURE  VITAMIN B12  VITAMIN B1                                                                                                                          Radiology No results found.  Pertinent labs & imaging results that were available during my care of the patient were reviewed by me and considered in my medical decision making (see MDM for details).  Medications Ordered in ED Medications  lactated ringers bolus 1,000 mL (0 mLs Intravenous Stopped 11/10/22 1413)  fosfomycin (MONUROL) packet 3 g (3 g Oral Given 11/10/22 1529)                                                                                                                                     Procedures Procedures  (including critical care time)  Medical Decision Making / ED Course   This patient presents to the ED for concern of dysuria, this involves an extensive number of treatment options, and is a complaint that carries with it a high risk of complications and morbidity.  The differential diagnosis includes UTI, urinary retention, bladder spasms, electrolyte abnormality, dehydration  MDM: Patient seen Emergency Department for evaluation of dark urine  and dysuria with PCP concern for possible urosepsis.  Physical exam is unremarkable with a benign abdominal exam, vital signs not consistent with sepsis.  Patient is alert and oriented answering all questions appropriately.  Laboratory evaluation with a  hemoglobin of 11.1 which is improved from baseline, creatinine 1.44 which is near baseline, vitamin B12 levels are normal, urinalysis with trace leuk esterase, 6-10 white blood cells and no bacteria.  Due to concern for possible developing urinary tract infection as the patient is minimally symptomatic, we sent a urine culture and will treat with single dose fosfomycin to ensure adequate treatment and decrease long-term systemic antibiotic use that would ultimately open the patient up to recurrent C. difficile infections.  Patient fluid resuscitated and discharged with outpatient follow-up.   Additional history obtained: -Additional history obtained from multiple family members -External records from outside source obtained and reviewed including: Chart review including previous notes, labs, imaging, consultation notes   Lab Tests: -I ordered, reviewed, and interpreted labs.   The pertinent results include:   Labs Reviewed  COMPREHENSIVE METABOLIC PANEL - Abnormal; Notable for the following components:      Result Value   Glucose, Bld 162 (*)    Creatinine, Ser 1.44 (*)    Albumin 3.3 (*)    GFR, Estimated 36 (*)    All other components within normal limits  CBC WITH DIFFERENTIAL/PLATELET - Abnormal; Notable for the following components:   Hemoglobin 11.1 (*)    HCT 34.3 (*)    All other components within normal limits  URINALYSIS, ROUTINE W REFLEX MICROSCOPIC - Abnormal; Notable for the following components:   Color, Urine COLORLESS (*)    Specific Gravity, Urine 1.004 (*)    Protein, ur 30 (*)    Leukocytes,Ua TRACE (*)    All other components within normal limits  URINE CULTURE  VITAMIN B12  VITAMIN B1       Medicines ordered  and prescription drug management: Meds ordered this encounter  Medications   lactated ringers bolus 1,000 mL   fosfomycin (MONUROL) packet 3 g    -I have reviewed the patients home medicines and have made adjustments as needed  Critical interventions none   Cardiac Monitoring: The patient was maintained on a cardiac monitor.  I personally viewed and interpreted the cardiac monitored which showed an underlying rhythm of: NSR  Social Determinants of Health:  Factors impacting patients care include: none   Reevaluation: After the interventions noted above, I reevaluated the patient and found that they have :improved  Co morbidities that complicate the patient evaluation  Past Medical History:  Diagnosis Date   Anemia    Anxiety    Arthritis    Asthma    Biliary colic    C. difficile colitis 08/2022   Cholelithiasis    CKD (chronic kidney disease), stage III [facility])    nephrologist--  dr Meredeth Ide   Colon polyps    Diverticulosis of colon    Gastric ulcer    Glaucoma    History of asthma    1980's  no longer problem since 1980's   History of diverticulitis of colon    recurrent--  2014;  2013;  2012   Hyperlipidemia    Hypertension    Hypothyroidism    Insulin dependent type 2 diabetes mellitus ([place])    followed by dr Jenny Reichmann lambeth ([facility])    Murmur    Peripheral neuropathy    Stroke [facility])    Vitamin D deficiency    Wears hearing aid    bilateral   Wears partial dentures    lower partial and upper full      Dispostion: I considered admission for this patient, but she does not meet inpatient criteria for  admission she is safe to discharge the patient follow-up     Final Clinical Impression(s) / ED Diagnoses Final diagnoses:  Dysuria     '@PCDICTATION'$ @    Teressa Lower, MD 11/10/22 2051

## 2022-11-12 DIAGNOSIS — G9341 Metabolic encephalopathy: Secondary | ICD-10-CM | POA: Diagnosis not present

## 2022-11-12 DIAGNOSIS — N183 Chronic kidney disease, stage 3 unspecified: Secondary | ICD-10-CM | POA: Diagnosis not present

## 2022-11-12 DIAGNOSIS — E1142 Type 2 diabetes mellitus with diabetic polyneuropathy: Secondary | ICD-10-CM | POA: Diagnosis not present

## 2022-11-12 DIAGNOSIS — R32 Unspecified urinary incontinence: Secondary | ICD-10-CM | POA: Diagnosis not present

## 2022-11-12 DIAGNOSIS — E1122 Type 2 diabetes mellitus with diabetic chronic kidney disease: Secondary | ICD-10-CM | POA: Diagnosis not present

## 2022-11-12 DIAGNOSIS — I10 Essential (primary) hypertension: Secondary | ICD-10-CM | POA: Diagnosis not present

## 2022-11-12 DIAGNOSIS — N1832 Chronic kidney disease, stage 3b: Secondary | ICD-10-CM | POA: Diagnosis not present

## 2022-11-12 DIAGNOSIS — D631 Anemia in chronic kidney disease: Secondary | ICD-10-CM | POA: Diagnosis not present

## 2022-11-12 LAB — URINE CULTURE: Culture: 50000 — AB

## 2022-11-13 ENCOUNTER — Other Ambulatory Visit (HOSPITAL_COMMUNITY): Payer: Self-pay

## 2022-11-13 ENCOUNTER — Telehealth (HOSPITAL_BASED_OUTPATIENT_CLINIC_OR_DEPARTMENT_OTHER): Payer: Self-pay | Admitting: *Deleted

## 2022-11-13 NOTE — Telephone Encounter (Signed)
Post ED Visit - Positive Culture Follow-up  Culture report reviewed by antimicrobial stewardship pharmacist: [place] Team '[]'$  Elenor Quinones, Pharm.D. '[]'$  Heide Guile, Pharm.D., BCPS AQ-ID '[]'$  Parks Neptune, Pharm.D., BCPS '[]'$  Alycia Rossetti, Pharm.D., BCPS '[]'$  [place], Pharm.D., BCPS, AAHIVP '[]'$  Legrand Como, Pharm.D., BCPS, AAHIVP '[]'$  Salome Arnt, PharmD, BCPS '[]'$  Johnnette Gourd, PharmD, BCPS '[]'$  Hughes Better, PharmD, BCPS '[]'$  Leeroy Cha, PharmD '[]'$  Laqueta Linden, PharmD, BCPS '[]'$  Albertina Parr, PharmD  [place] Team '[]'$  Leodis Sias, PharmD '[]'$  Lindell Spar, PharmD '[]'$  Royetta Asal, PharmD '[]'$  Graylin Shiver, Rph '[]'$  Rema Fendt) Glennon Mac, PharmD '[]'$  Arlyn Dunning, PharmD '[]'$  Netta Cedars, PharmD '[]'$  Dia Sitter, PharmD '[]'$  Leone Haven, PharmD '[x]'$  Gretta Arab, PharmD '[]'$  Theodis Shove, PharmD '[]'$  Peggyann Juba, PharmD '[]'$  Reuel Boom, PharmD   Positive urine culture Treated with fosfomycin in ED on 12/13.  No further patient follow-up is required at this time per Sherrell Puller, PA-C  Rosie Fate 11/13/2022, 12:18 PM

## 2022-11-14 LAB — VITAMIN B1: Vitamin B1 (Thiamine): 116.1 nmol/L (ref 66.5–200.0)

## 2022-11-15 ENCOUNTER — Other Ambulatory Visit (HOSPITAL_COMMUNITY): Payer: Self-pay

## 2022-11-15 DIAGNOSIS — E1142 Type 2 diabetes mellitus with diabetic polyneuropathy: Secondary | ICD-10-CM | POA: Diagnosis not present

## 2022-11-15 DIAGNOSIS — D631 Anemia in chronic kidney disease: Secondary | ICD-10-CM | POA: Diagnosis not present

## 2022-11-15 DIAGNOSIS — I10 Essential (primary) hypertension: Secondary | ICD-10-CM | POA: Diagnosis not present

## 2022-11-15 DIAGNOSIS — N1832 Chronic kidney disease, stage 3b: Secondary | ICD-10-CM | POA: Diagnosis not present

## 2022-11-15 DIAGNOSIS — G9341 Metabolic encephalopathy: Secondary | ICD-10-CM | POA: Diagnosis not present

## 2022-11-15 DIAGNOSIS — E1122 Type 2 diabetes mellitus with diabetic chronic kidney disease: Secondary | ICD-10-CM | POA: Diagnosis not present

## 2022-11-16 ENCOUNTER — Ambulatory Visit: Payer: Self-pay

## 2022-11-16 NOTE — Patient Outreach (Signed)
  Care Coordination   Follow Up Visit Note   11/16/2022 Name: Diana Harper MRN: [id number] DOB: May 08, 1936  Diana Harper is a 86 y.o. year old female who sees Diana Bien, MD for primary care. I spoke with Diana Harper by phone today.  What matters to the patients health and wellness today?  Patient will follow up with Urology for evaluation of dysuria and UTI.     Goals Addressed             This Visit's Progress    To complete evaluation with Urology       Care Coordination Interventions: Placed successful outbound call with daughter Diana Harper  Evaluation of current treatment plan related to UTI and dysuria and patient's adherence to plan as established by provider Determined patient has experienced 2 ED visits since last RN CC follow up Determined patient was diagnosed with dysuria and UTI Review of patient status, including review of consultant's reports, relevant laboratory and other test results, and medications completed Reviewed scheduled/upcoming provider appointments including: follow up call with Urology scheduled for 11/18/22; Nephrology follow up scheduled for 11/26/22 Determined daughter Diana Harper will provide transportation for patient's MD appointments and will accompanying her to these appointments  Educated on the importance to complete full course of antibiotics and stay well hydrated with water           SDOH assessments and interventions completed:  No     Care Coordination Interventions:  Yes, provided   Follow up plan: Follow up call scheduled for 12/17/22 '@2'$ :30 PM    Encounter Outcome:  Pt. Visit Completed

## 2022-11-16 NOTE — Patient Instructions (Signed)
Visit Information  Thank you for taking time to visit with me today. Please don't hesitate to contact me if I can be of assistance to you.   Following are the goals we discussed today:   Goals Addressed             This Visit's Progress    To complete evaluation with Urology       Care Coordination Interventions: Placed successful outbound call with daughter [name]  Evaluation of current treatment plan related to UTI and dysuria and patient's adherence to plan as established by provider Determined patient has experienced 2 ED visits since last RN CC follow up Determined patient was diagnosed with dysuria and UTI Review of patient status, including review of consultant's reports, relevant laboratory and other test results, and medications completed Reviewed scheduled/upcoming provider appointments including: follow up call with Urology scheduled for 11/18/22; Nephrology follow up scheduled for 11/26/22 Determined daughter [name] will provide transportation for patient's MD appointments and will accompanying her to these appointments  Educated on the importance to complete full course of antibiotics and stay well hydrated with water           Our next appointment is by telephone on 12/17/22 at 2:30 PM  Please call the care guide team at [phone] if you need to cancel or reschedule your appointment.   If you are experiencing a Mental Health or [place] or need someone to talk to, please call 1-800-273-TALK (toll free, 24 hour hotline)  Patient verbalizes understanding of instructions and care plan provided today and agrees to view in [facility]. Active MyChart status and patient understanding of how to access instructions and care plan via MyChart confirmed with patient.     Barb Merino, RN, BSN, CCM Care Management Coordinator [facility] Care Management   Direct Phone: [phone]

## 2022-11-18 DIAGNOSIS — R300 Dysuria: Secondary | ICD-10-CM | POA: Diagnosis not present

## 2022-11-18 DIAGNOSIS — N3941 Urge incontinence: Secondary | ICD-10-CM | POA: Diagnosis not present

## 2022-11-18 DIAGNOSIS — N3000 Acute cystitis without hematuria: Secondary | ICD-10-CM | POA: Diagnosis not present

## 2022-11-19 DIAGNOSIS — I10 Essential (primary) hypertension: Secondary | ICD-10-CM | POA: Diagnosis not present

## 2022-11-19 DIAGNOSIS — E1142 Type 2 diabetes mellitus with diabetic polyneuropathy: Secondary | ICD-10-CM | POA: Diagnosis not present

## 2022-11-19 DIAGNOSIS — D631 Anemia in chronic kidney disease: Secondary | ICD-10-CM | POA: Diagnosis not present

## 2022-11-19 DIAGNOSIS — N1832 Chronic kidney disease, stage 3b: Secondary | ICD-10-CM | POA: Diagnosis not present

## 2022-11-19 DIAGNOSIS — E1122 Type 2 diabetes mellitus with diabetic chronic kidney disease: Secondary | ICD-10-CM | POA: Diagnosis not present

## 2022-11-19 DIAGNOSIS — G9341 Metabolic encephalopathy: Secondary | ICD-10-CM | POA: Diagnosis not present

## 2022-11-22 DIAGNOSIS — F419 Anxiety disorder, unspecified: Secondary | ICD-10-CM | POA: Diagnosis not present

## 2022-11-22 DIAGNOSIS — E538 Deficiency of other specified B group vitamins: Secondary | ICD-10-CM | POA: Diagnosis not present

## 2022-11-22 DIAGNOSIS — H9193 Unspecified hearing loss, bilateral: Secondary | ICD-10-CM | POA: Diagnosis not present

## 2022-11-22 DIAGNOSIS — Z7951 Long term (current) use of inhaled steroids: Secondary | ICD-10-CM | POA: Diagnosis not present

## 2022-11-22 DIAGNOSIS — J4489 Other specified chronic obstructive pulmonary disease: Secondary | ICD-10-CM | POA: Diagnosis not present

## 2022-11-22 DIAGNOSIS — F5109 Other insomnia not due to a substance or known physiological condition: Secondary | ICD-10-CM | POA: Diagnosis not present

## 2022-11-22 DIAGNOSIS — K760 Fatty (change of) liver, not elsewhere classified: Secondary | ICD-10-CM | POA: Diagnosis not present

## 2022-11-22 DIAGNOSIS — N1832 Chronic kidney disease, stage 3b: Secondary | ICD-10-CM | POA: Diagnosis not present

## 2022-11-22 DIAGNOSIS — D631 Anemia in chronic kidney disease: Secondary | ICD-10-CM | POA: Diagnosis not present

## 2022-11-22 DIAGNOSIS — M1990 Unspecified osteoarthritis, unspecified site: Secondary | ICD-10-CM | POA: Diagnosis not present

## 2022-11-22 DIAGNOSIS — H409 Unspecified glaucoma: Secondary | ICD-10-CM | POA: Diagnosis not present

## 2022-11-22 DIAGNOSIS — E1122 Type 2 diabetes mellitus with diabetic chronic kidney disease: Secondary | ICD-10-CM | POA: Diagnosis not present

## 2022-11-22 DIAGNOSIS — E785 Hyperlipidemia, unspecified: Secondary | ICD-10-CM | POA: Diagnosis not present

## 2022-11-22 DIAGNOSIS — E519 Thiamine deficiency, unspecified: Secondary | ICD-10-CM | POA: Diagnosis not present

## 2022-11-22 DIAGNOSIS — I10 Essential (primary) hypertension: Secondary | ICD-10-CM | POA: Diagnosis not present

## 2022-11-22 DIAGNOSIS — K8020 Calculus of gallbladder without cholecystitis without obstruction: Secondary | ICD-10-CM | POA: Diagnosis not present

## 2022-11-22 DIAGNOSIS — G9341 Metabolic encephalopathy: Secondary | ICD-10-CM | POA: Diagnosis not present

## 2022-11-22 DIAGNOSIS — E1142 Type 2 diabetes mellitus with diabetic polyneuropathy: Secondary | ICD-10-CM | POA: Diagnosis not present

## 2022-11-22 DIAGNOSIS — Z794 Long term (current) use of insulin: Secondary | ICD-10-CM | POA: Diagnosis not present

## 2022-11-22 DIAGNOSIS — G8929 Other chronic pain: Secondary | ICD-10-CM | POA: Diagnosis not present

## 2022-11-22 DIAGNOSIS — E559 Vitamin D deficiency, unspecified: Secondary | ICD-10-CM | POA: Diagnosis not present

## 2022-11-22 DIAGNOSIS — M79602 Pain in left arm: Secondary | ICD-10-CM | POA: Diagnosis not present

## 2022-11-22 DIAGNOSIS — F32A Depression, unspecified: Secondary | ICD-10-CM | POA: Diagnosis not present

## 2022-11-22 DIAGNOSIS — Z7982 Long term (current) use of aspirin: Secondary | ICD-10-CM | POA: Diagnosis not present

## 2022-11-22 DIAGNOSIS — E039 Hypothyroidism, unspecified: Secondary | ICD-10-CM | POA: Diagnosis not present

## 2022-11-23 ENCOUNTER — Telehealth: Payer: Self-pay

## 2022-11-23 NOTE — Telephone Encounter (Signed)
(  1:13 pm) PC SW scheduled an initial palliative care consult for patient. Consult is scheduled for 11/25/22 @ 3 pm.

## 2022-11-24 DIAGNOSIS — G9341 Metabolic encephalopathy: Secondary | ICD-10-CM | POA: Diagnosis not present

## 2022-11-24 DIAGNOSIS — D631 Anemia in chronic kidney disease: Secondary | ICD-10-CM | POA: Diagnosis not present

## 2022-11-24 DIAGNOSIS — E1122 Type 2 diabetes mellitus with diabetic chronic kidney disease: Secondary | ICD-10-CM | POA: Diagnosis not present

## 2022-11-24 DIAGNOSIS — E1142 Type 2 diabetes mellitus with diabetic polyneuropathy: Secondary | ICD-10-CM | POA: Diagnosis not present

## 2022-11-24 DIAGNOSIS — N1832 Chronic kidney disease, stage 3b: Secondary | ICD-10-CM | POA: Diagnosis not present

## 2022-11-24 DIAGNOSIS — I10 Essential (primary) hypertension: Secondary | ICD-10-CM | POA: Diagnosis not present

## 2022-11-25 ENCOUNTER — Other Ambulatory Visit: Payer: Medicare Other

## 2022-11-26 ENCOUNTER — Other Ambulatory Visit: Payer: Medicare Other

## 2022-11-26 DIAGNOSIS — I129 Hypertensive chronic kidney disease with stage 1 through stage 4 chronic kidney disease, or unspecified chronic kidney disease: Secondary | ICD-10-CM | POA: Diagnosis not present

## 2022-11-26 DIAGNOSIS — E039 Hypothyroidism, unspecified: Secondary | ICD-10-CM | POA: Diagnosis not present

## 2022-11-26 DIAGNOSIS — N179 Acute kidney failure, unspecified: Secondary | ICD-10-CM | POA: Diagnosis not present

## 2022-11-26 DIAGNOSIS — E1122 Type 2 diabetes mellitus with diabetic chronic kidney disease: Secondary | ICD-10-CM | POA: Diagnosis not present

## 2022-11-26 DIAGNOSIS — D638 Anemia in other chronic diseases classified elsewhere: Secondary | ICD-10-CM | POA: Diagnosis not present

## 2022-11-26 DIAGNOSIS — E1165 Type 2 diabetes mellitus with hyperglycemia: Secondary | ICD-10-CM | POA: Diagnosis not present

## 2022-11-26 DIAGNOSIS — Z515 Encounter for palliative care: Secondary | ICD-10-CM

## 2022-11-26 DIAGNOSIS — N1832 Chronic kidney disease, stage 3b: Secondary | ICD-10-CM | POA: Diagnosis not present

## 2022-11-26 NOTE — Progress Notes (Signed)
1502- Palliative Care Encounter Note  PATIENT NAME: Diana Harper DOB: October 04, 2036 MRN: [id number]   PRIMARY CARE PROVIDER: Rachell Cipro, MD REFERRING PROVIDER: NTUC from Hospice               RN completed follow up by phone. Daughter also present. Verified that I am speaking with the correct person using two identifiers.   I discussed the limitations of evaluation and management by telemedicine. The patient expressed understanding and agreed to proceed.   History of Present Illness: Pt has hx of CVA, DM type II, HTN, C-Diff.   Cognitive: Pt is alert and oriented x 3. Pt is HOH doesn't wear hearing aids often. Answers questions appropriately.  Appetite: Pt and daughter reports that pt has a decreased appetite but does still consume approx 1/3 of meals. Pt has glucerna daily. Daughter reports that pt consumes 4-5 cans of gingerale per day, however does not drink any water. States, 'we just went to kidney doctor today who said she has to start drinking more water and training her bladder." RN encouraged pt to drink water, to try to drink 6 oz of water and then have some gingerale as a reward, then continue that during the day to increase water intake. Daughter said that they have tried multiple water additives for flavor, but pt basically refuses.  Mobility: Reports using ane and walker when necessary. Reports falls with last one being about a week and a half ago. No injuries for it. Pt [facility] by [facility]  GI/GU: pt and daughter report incontinence of bowel and bladder in the last 6 months. Reports that incontinence of stool is occassional and has started since her getting c-diff last hospitalization. However pt reports that she "can not make it to bathroom" when she has to urinate. Also denies that she knows when her pull up is wet. RN talked to pt and daughter about bladder training. To set a timer to have pt go to the bathroom and sit for a bit at regular intervals. Voiced  understanding  Goals of Care: Purpose of palliative care was discussed with pt and daughter. When hospice was mentioned, pt and daughter immediately interrupted and said they were not interested.    CODE STATUS: Full Code ADVANCED DIRECTIVES: No MOST FORM: No PPS:   Next Appt Scheduled For: 12/17/21 at 2pm Patient states she would rather have phone visits for now with the viruses going around. Daughter agreed.

## 2022-12-01 ENCOUNTER — Ambulatory Visit: Payer: Self-pay

## 2022-12-01 DIAGNOSIS — I10 Essential (primary) hypertension: Secondary | ICD-10-CM | POA: Diagnosis not present

## 2022-12-01 DIAGNOSIS — G9341 Metabolic encephalopathy: Secondary | ICD-10-CM | POA: Diagnosis not present

## 2022-12-01 DIAGNOSIS — E1122 Type 2 diabetes mellitus with diabetic chronic kidney disease: Secondary | ICD-10-CM | POA: Diagnosis not present

## 2022-12-01 DIAGNOSIS — N1832 Chronic kidney disease, stage 3b: Secondary | ICD-10-CM | POA: Diagnosis not present

## 2022-12-01 DIAGNOSIS — D631 Anemia in chronic kidney disease: Secondary | ICD-10-CM | POA: Diagnosis not present

## 2022-12-01 DIAGNOSIS — E1142 Type 2 diabetes mellitus with diabetic polyneuropathy: Secondary | ICD-10-CM | POA: Diagnosis not present

## 2022-12-01 NOTE — Patient Outreach (Signed)
  Care Coordination   12/01/2022 Name: Diana Harper MRN: [id number] DOB: March 24, 1936   Care Coordination Outreach Attempts:  An unsuccessful telephone outreach was attempted for a scheduled appointment today.  Follow Up Plan:  Additional outreach attempts will be made to offer the patient care coordination information and services.   Encounter Outcome:  No Answer   Care Coordination Interventions:  No, not indicated    Barb Merino, RN, BSN, CCM Care Management Coordinator [facility] Care Management Direct Phone: [phone]

## 2022-12-08 DIAGNOSIS — E039 Hypothyroidism, unspecified: Secondary | ICD-10-CM | POA: Diagnosis not present

## 2022-12-08 DIAGNOSIS — E1122 Type 2 diabetes mellitus with diabetic chronic kidney disease: Secondary | ICD-10-CM | POA: Diagnosis not present

## 2022-12-08 DIAGNOSIS — Z794 Long term (current) use of insulin: Secondary | ICD-10-CM | POA: Diagnosis not present

## 2022-12-08 DIAGNOSIS — N1831 Chronic kidney disease, stage 3a: Secondary | ICD-10-CM | POA: Diagnosis not present

## 2022-12-08 NOTE — Progress Notes (Signed)
Rescheduled 12/28/22  [place]  Direct Dial: [phone]

## 2022-12-09 DIAGNOSIS — D631 Anemia in chronic kidney disease: Secondary | ICD-10-CM | POA: Diagnosis not present

## 2022-12-09 DIAGNOSIS — G9341 Metabolic encephalopathy: Secondary | ICD-10-CM | POA: Diagnosis not present

## 2022-12-09 DIAGNOSIS — E1122 Type 2 diabetes mellitus with diabetic chronic kidney disease: Secondary | ICD-10-CM | POA: Diagnosis not present

## 2022-12-09 DIAGNOSIS — I10 Essential (primary) hypertension: Secondary | ICD-10-CM | POA: Diagnosis not present

## 2022-12-09 DIAGNOSIS — E1142 Type 2 diabetes mellitus with diabetic polyneuropathy: Secondary | ICD-10-CM | POA: Diagnosis not present

## 2022-12-09 DIAGNOSIS — N1832 Chronic kidney disease, stage 3b: Secondary | ICD-10-CM | POA: Diagnosis not present

## 2022-12-15 DIAGNOSIS — D631 Anemia in chronic kidney disease: Secondary | ICD-10-CM | POA: Diagnosis not present

## 2022-12-15 DIAGNOSIS — E1122 Type 2 diabetes mellitus with diabetic chronic kidney disease: Secondary | ICD-10-CM | POA: Diagnosis not present

## 2022-12-15 DIAGNOSIS — G9341 Metabolic encephalopathy: Secondary | ICD-10-CM | POA: Diagnosis not present

## 2022-12-15 DIAGNOSIS — I10 Essential (primary) hypertension: Secondary | ICD-10-CM | POA: Diagnosis not present

## 2022-12-15 DIAGNOSIS — E1142 Type 2 diabetes mellitus with diabetic polyneuropathy: Secondary | ICD-10-CM | POA: Diagnosis not present

## 2022-12-15 DIAGNOSIS — N1832 Chronic kidney disease, stage 3b: Secondary | ICD-10-CM | POA: Diagnosis not present

## 2022-12-17 ENCOUNTER — Telehealth: Payer: Self-pay

## 2022-12-17 ENCOUNTER — Other Ambulatory Visit: Payer: Medicare Other

## 2022-12-17 NOTE — Telephone Encounter (Signed)
1404 Palliative Care Note  RN attempted contact again for scheduled telephone visit. No answer. LVM with contact info and request to call back.  Jacqulyn Cane, RN

## 2022-12-17 NOTE — Telephone Encounter (Signed)
1357 Palliative Care Note  RN attempted to contact pt for scheduled 2pm appt. No answer. Will try again in a few minutes.

## 2022-12-20 DIAGNOSIS — G9341 Metabolic encephalopathy: Secondary | ICD-10-CM | POA: Diagnosis not present

## 2022-12-20 DIAGNOSIS — I10 Essential (primary) hypertension: Secondary | ICD-10-CM | POA: Diagnosis not present

## 2022-12-20 DIAGNOSIS — E1122 Type 2 diabetes mellitus with diabetic chronic kidney disease: Secondary | ICD-10-CM | POA: Diagnosis not present

## 2022-12-20 DIAGNOSIS — D631 Anemia in chronic kidney disease: Secondary | ICD-10-CM | POA: Diagnosis not present

## 2022-12-20 DIAGNOSIS — E1142 Type 2 diabetes mellitus with diabetic polyneuropathy: Secondary | ICD-10-CM | POA: Diagnosis not present

## 2022-12-20 DIAGNOSIS — N1832 Chronic kidney disease, stage 3b: Secondary | ICD-10-CM | POA: Diagnosis not present

## 2022-12-21 ENCOUNTER — Telehealth: Payer: Self-pay

## 2022-12-21 NOTE — Telephone Encounter (Signed)
-----  Message from Noralyn Pick, NP sent at 12/21/2022  7:40 AM EST ----- Kayleen Alig, pls contact patient and let her know Dr. Rush Landmark recommends for her to schedule a follow up EGD (refer to note below). Pls verify if she has experienced any new health issues since her last office visit. If her health status is stable and she is willing to schedule an EGD pls do so. If she prefers to schedule an office follow-up visit to further discuss that is okay too. Pls convert this msg to a phone note. Thank you ----- Message ----- From: Irving Copas., MD Sent: 12/17/2022   9:02 AM EST To: Timothy Lasso, RN; #  NPY, OK for direct EGD, unless patient is having any other issues is fine. Thanks. GM ----- Message ----- From: Noralyn Pick, NP Sent: 12/17/2022   8:45 AM EST To: Timothy Lasso, RN; Irving Copas., MD  Dr.Mansouraty, refer to office visit 10/18/2022. HX of reflux esophagitis. EGD 03/05/2021 identified reflux esophagitis, non bleeding gastric ulcer, reactive gastropathy with intestinal metaplasia. A EGD in 3 to 4 months was recommended to assess for ulcer healing and to perform gastric mapping secondary to intestinal metaplasia which has not been done. Per your addendum, after [name] resolved,  "upper endoscopy is not unreasonable but we can defer that to early next year so that at follow-up we can work on scheduling that as able".  Do you want patient to schedule a follow up office visit with you or schedule EGD without office appt?

## 2022-12-22 DIAGNOSIS — E538 Deficiency of other specified B group vitamins: Secondary | ICD-10-CM | POA: Diagnosis not present

## 2022-12-22 DIAGNOSIS — Z7982 Long term (current) use of aspirin: Secondary | ICD-10-CM | POA: Diagnosis not present

## 2022-12-22 DIAGNOSIS — E559 Vitamin D deficiency, unspecified: Secondary | ICD-10-CM | POA: Diagnosis not present

## 2022-12-22 DIAGNOSIS — Z792 Long term (current) use of antibiotics: Secondary | ICD-10-CM | POA: Diagnosis not present

## 2022-12-22 DIAGNOSIS — I10 Essential (primary) hypertension: Secondary | ICD-10-CM | POA: Diagnosis not present

## 2022-12-22 DIAGNOSIS — A0472 Enterocolitis due to Clostridium difficile, not specified as recurrent: Secondary | ICD-10-CM | POA: Diagnosis not present

## 2022-12-22 DIAGNOSIS — R32 Unspecified urinary incontinence: Secondary | ICD-10-CM | POA: Diagnosis not present

## 2022-12-22 DIAGNOSIS — Z9181 History of falling: Secondary | ICD-10-CM | POA: Diagnosis not present

## 2022-12-22 DIAGNOSIS — M19012 Primary osteoarthritis, left shoulder: Secondary | ICD-10-CM | POA: Diagnosis not present

## 2022-12-22 DIAGNOSIS — H9193 Unspecified hearing loss, bilateral: Secondary | ICD-10-CM | POA: Diagnosis not present

## 2022-12-22 DIAGNOSIS — E785 Hyperlipidemia, unspecified: Secondary | ICD-10-CM | POA: Diagnosis not present

## 2022-12-22 DIAGNOSIS — E1140 Type 2 diabetes mellitus with diabetic neuropathy, unspecified: Secondary | ICD-10-CM | POA: Diagnosis not present

## 2022-12-22 DIAGNOSIS — K760 Fatty (change of) liver, not elsewhere classified: Secondary | ICD-10-CM | POA: Diagnosis not present

## 2022-12-22 DIAGNOSIS — Z794 Long term (current) use of insulin: Secondary | ICD-10-CM | POA: Diagnosis not present

## 2022-12-22 DIAGNOSIS — E039 Hypothyroidism, unspecified: Secondary | ICD-10-CM | POA: Diagnosis not present

## 2022-12-22 DIAGNOSIS — I69354 Hemiplegia and hemiparesis following cerebral infarction affecting left non-dominant side: Secondary | ICD-10-CM | POA: Diagnosis not present

## 2022-12-22 NOTE — Telephone Encounter (Signed)
Left message on machine to call back  

## 2022-12-22 NOTE — Telephone Encounter (Signed)
Patient is returning your call.  

## 2022-12-23 NOTE — Telephone Encounter (Signed)
Attempted to reach pt by phone no answer and voice mail full

## 2022-12-23 NOTE — Telephone Encounter (Signed)
I have spoke to the pt daughter and she prefers to have an office visit to discuss EGD.  The pt has had multiple falls and work up with nothing being found.  She does prefer appt to discuss this in more detail.  Appt made to see Dr Rush Landmark on 3/20 at 130 pm.    Juluis Rainier

## 2022-12-24 NOTE — Telephone Encounter (Signed)
That will be fine. Thanks. GM

## 2022-12-27 DIAGNOSIS — N3941 Urge incontinence: Secondary | ICD-10-CM | POA: Diagnosis not present

## 2022-12-27 DIAGNOSIS — R3914 Feeling of incomplete bladder emptying: Secondary | ICD-10-CM | POA: Diagnosis not present

## 2022-12-27 DIAGNOSIS — N3000 Acute cystitis without hematuria: Secondary | ICD-10-CM | POA: Diagnosis not present

## 2022-12-28 ENCOUNTER — Other Ambulatory Visit: Payer: Medicare Other

## 2022-12-28 ENCOUNTER — Ambulatory Visit: Payer: Self-pay

## 2022-12-28 DIAGNOSIS — A0472 Enterocolitis due to Clostridium difficile, not specified as recurrent: Secondary | ICD-10-CM | POA: Diagnosis not present

## 2022-12-28 DIAGNOSIS — I10 Essential (primary) hypertension: Secondary | ICD-10-CM | POA: Diagnosis not present

## 2022-12-28 DIAGNOSIS — H9193 Unspecified hearing loss, bilateral: Secondary | ICD-10-CM | POA: Diagnosis not present

## 2022-12-28 DIAGNOSIS — M19012 Primary osteoarthritis, left shoulder: Secondary | ICD-10-CM | POA: Diagnosis not present

## 2022-12-28 DIAGNOSIS — E039 Hypothyroidism, unspecified: Secondary | ICD-10-CM | POA: Diagnosis not present

## 2022-12-28 DIAGNOSIS — I69354 Hemiplegia and hemiparesis following cerebral infarction affecting left non-dominant side: Secondary | ICD-10-CM | POA: Diagnosis not present

## 2022-12-28 DIAGNOSIS — R32 Unspecified urinary incontinence: Secondary | ICD-10-CM | POA: Diagnosis not present

## 2022-12-28 DIAGNOSIS — E559 Vitamin D deficiency, unspecified: Secondary | ICD-10-CM | POA: Diagnosis not present

## 2022-12-28 DIAGNOSIS — E785 Hyperlipidemia, unspecified: Secondary | ICD-10-CM | POA: Diagnosis not present

## 2022-12-28 DIAGNOSIS — K760 Fatty (change of) liver, not elsewhere classified: Secondary | ICD-10-CM | POA: Diagnosis not present

## 2022-12-28 DIAGNOSIS — E538 Deficiency of other specified B group vitamins: Secondary | ICD-10-CM | POA: Diagnosis not present

## 2022-12-28 DIAGNOSIS — Z9181 History of falling: Secondary | ICD-10-CM | POA: Diagnosis not present

## 2022-12-28 NOTE — Patient Outreach (Signed)
  Care Coordination   12/28/2022 Name: Diana Harper MRN: [id number] DOB: [birth year]   Care Coordination Outreach Attempts:  An unsuccessful telephone outreach was attempted for a scheduled appointment today.  Follow Up Plan:  Additional outreach attempts will be made to offer the patient care coordination information and services.   Encounter Outcome:  No Answer   Care Coordination Interventions:  No, not indicated    Barb Merino, RN, BSN, CCM Care Management Coordinator [facility] Care Management  Direct Phone: [phone]

## 2022-12-29 DIAGNOSIS — I10 Essential (primary) hypertension: Secondary | ICD-10-CM | POA: Diagnosis not present

## 2022-12-29 DIAGNOSIS — E039 Hypothyroidism, unspecified: Secondary | ICD-10-CM | POA: Diagnosis not present

## 2022-12-29 DIAGNOSIS — I69354 Hemiplegia and hemiparesis following cerebral infarction affecting left non-dominant side: Secondary | ICD-10-CM | POA: Diagnosis not present

## 2022-12-29 DIAGNOSIS — E785 Hyperlipidemia, unspecified: Secondary | ICD-10-CM | POA: Diagnosis not present

## 2022-12-29 DIAGNOSIS — E1140 Type 2 diabetes mellitus with diabetic neuropathy, unspecified: Secondary | ICD-10-CM | POA: Diagnosis not present

## 2022-12-29 DIAGNOSIS — A0472 Enterocolitis due to Clostridium difficile, not specified as recurrent: Secondary | ICD-10-CM | POA: Diagnosis not present

## 2022-12-30 ENCOUNTER — Other Ambulatory Visit: Payer: Self-pay

## 2022-12-30 ENCOUNTER — Encounter (HOSPITAL_COMMUNITY): Payer: Self-pay

## 2022-12-30 ENCOUNTER — Emergency Department (HOSPITAL_COMMUNITY)
Admission: EM | Admit: 2022-12-30 | Discharge: 2022-12-30 | Disposition: A | Discharge status: Home / Self Care | Payer: Medicare Other | Attending: Emergency Medicine | Admitting: Emergency Medicine

## 2022-12-30 ENCOUNTER — Emergency Department (HOSPITAL_COMMUNITY): Payer: Medicare Other

## 2022-12-30 DIAGNOSIS — R7989 Other specified abnormal findings of blood chemistry: Secondary | ICD-10-CM

## 2022-12-30 DIAGNOSIS — Z8673 Personal history of transient ischemic attack (TIA), and cerebral infarction without residual deficits: Secondary | ICD-10-CM | POA: Insufficient documentation

## 2022-12-30 DIAGNOSIS — Z91040 Latex allergy status: Secondary | ICD-10-CM | POA: Insufficient documentation

## 2022-12-30 DIAGNOSIS — Z79899 Other long term (current) drug therapy: Secondary | ICD-10-CM | POA: Diagnosis not present

## 2022-12-30 DIAGNOSIS — Z794 Long term (current) use of insulin: Secondary | ICD-10-CM | POA: Insufficient documentation

## 2022-12-30 DIAGNOSIS — N189 Chronic kidney disease, unspecified: Secondary | ICD-10-CM | POA: Diagnosis not present

## 2022-12-30 DIAGNOSIS — E1122 Type 2 diabetes mellitus with diabetic chronic kidney disease: Secondary | ICD-10-CM | POA: Diagnosis not present

## 2022-12-30 DIAGNOSIS — R509 Fever, unspecified: Secondary | ICD-10-CM | POA: Diagnosis not present

## 2022-12-30 DIAGNOSIS — R55 Syncope and collapse: Secondary | ICD-10-CM | POA: Diagnosis not present

## 2022-12-30 DIAGNOSIS — R5383 Other fatigue: Secondary | ICD-10-CM | POA: Diagnosis not present

## 2022-12-30 DIAGNOSIS — I10 Essential (primary) hypertension: Secondary | ICD-10-CM | POA: Diagnosis not present

## 2022-12-30 LAB — CBC WITH DIFFERENTIAL/PLATELET
Abs Immature Granulocytes: 0.02 10*3/uL (ref 0.00–0.07)
Basophils Absolute: 0 10*3/uL (ref 0.0–0.1)
Basophils Relative: 1 %
Eosinophils Absolute: 0.1 10*3/uL (ref 0.0–0.5)
Eosinophils Relative: 2 %
HCT: 36.3 % (ref 36.0–46.0)
Hemoglobin: 11.8 g/dL — ABNORMAL LOW (ref 12.0–15.0)
Immature Granulocytes: 0 %
Lymphocytes Relative: 30 %
Lymphs Abs: 2 10*3/uL (ref 0.7–4.0)
MCH: 28.6 pg (ref 26.0–34.0)
MCHC: 32.5 g/dL (ref 30.0–36.0)
MCV: 88.1 fL (ref 80.0–100.0)
Monocytes Absolute: 0.5 10*3/uL (ref 0.1–1.0)
Monocytes Relative: 7 %
Neutro Abs: 4 10*3/uL (ref 1.7–7.7)
Neutrophils Relative %: 60 %
Platelets: 230 10*3/uL (ref 150–400)
RBC: 4.12 MIL/uL (ref 3.87–5.11)
RDW: 13.6 % (ref 11.5–15.5)
WBC: 6.6 10*3/uL (ref 4.0–10.5)
nRBC: 0 % (ref 0.0–0.2)

## 2022-12-30 LAB — CBG MONITORING, ED: Glucose-Capillary: 206 mg/dL — ABNORMAL HIGH (ref 70–99)

## 2022-12-30 LAB — TROPONIN I (HIGH SENSITIVITY)
Troponin I (High Sensitivity): 12 ng/L (ref <18)
Troponin I (High Sensitivity): 11 ng/L (ref <18)

## 2022-12-30 LAB — COMPREHENSIVE METABOLIC PANEL
ALT: 16 U/L (ref 0–44)
AST: 20 U/L (ref 15–41)
Albumin: 3.5 g/dL (ref 3.5–5.0)
Alkaline Phosphatase: 53 U/L (ref 38–126)
Anion gap: 10 (ref 5–15)
BUN: 30 mg/dL — ABNORMAL HIGH (ref 8–23)
CO2: 27 mmol/L (ref 22–32)
Calcium: 9.1 mg/dL (ref 8.9–10.3)
Chloride: 103 mmol/L (ref 98–111)
Creatinine, Ser: 1.78 mg/dL — ABNORMAL HIGH (ref 0.44–1.00)
GFR, Estimated: 28 mL/min — ABNORMAL LOW (ref >60)
Glucose, Bld: 221 mg/dL — ABNORMAL HIGH (ref 70–99)
Potassium: 3.6 mmol/L (ref 3.5–5.1)
Sodium: 140 mmol/L (ref 135–145)
Total Bilirubin: 0.6 mg/dL (ref 0.3–1.2)
Total Protein: 7.4 g/dL (ref 6.5–8.1)

## 2022-12-30 LAB — URINALYSIS, ROUTINE W REFLEX MICROSCOPIC
Bilirubin Urine: NEGATIVE
Glucose, UA: NEGATIVE mg/dL
Hgb urine dipstick: NEGATIVE
Ketones, ur: NEGATIVE mg/dL
Nitrite: NEGATIVE
Protein, ur: 100 mg/dL — AB
Specific Gravity, Urine: 1.01 (ref 1.005–1.030)
pH: 6 (ref 5.0–8.0)

## 2022-12-30 MED ORDER — SODIUM CHLORIDE 0.9 % IV BOLUS
500.0000 mL | Freq: Once | INTRAVENOUS | Status: AC
  Administered 2022-12-30: 500 mL via INTRAVENOUS

## 2022-12-30 NOTE — ED Provider Notes (Signed)
[place] EMERGENCY DEPARTMENT AT [facility] Provider Note   CSN: [id number] Arrival date & time: 12/30/22  1550     History  Chief Complaint  Patient presents with   Altered Mental Status    Diana Harper is a 87 y.o. female.  Past medical history of diabetes, CVA, chronic kidney disease, incontinence, of C. difficile,, thiamine deficiency and B12 deficiency.  Presents the ER with her daughter today.  History is taken from patient and her daughter who lives with her.  Daughter states that patient had a near syncopal event today.  This was shortly after she had precipitated and of the physical therapy session with her husband, she normally  does physical therapy and Occupational Therapy on home but is awaiting her husband session today as well.    Apparently shortly after this she became less responsive in her chair but denies full syncope, they state that she was responding to voice and able to smile when asked but was not able to repeat sentences or move her extremities.  Was was called and her blood sugar was normal, they stated that she was very sweaty at that time.  No chest pain or shortness of breath.  This was not during exertion that this occurred.  No seizure-like activity.    Patient's daughter states this happened in the past when she was diagnosed with vasovagal episodes but today she was not responding as normally would which is why they called EMS   Altered Mental Status      Home Medications Prior to Admission medications   Medication Sig Start Date End Date Taking? Authorizing Provider  acetaminophen (TYLENOL) 500 MG tablet Take 1,000 mg by mouth every 8 (eight) hours as needed for mild pain, headache or fever.    [provider]  amLODipine (NORVASC) 5 MG tablet Take 1 tablet (5 mg total) by mouth daily. Patient not taking: Reported on 10/18/2022 09/16/22   Charlynne Cousins, MD  BAYER LOW DOSE 81 MG tablet Take 81 mg by mouth daily.     [provider]  buPROPion (WELLBUTRIN XL) 150 MG 24 hr tablet Take 150 mg by mouth daily. Patient not taking: Reported on 10/18/2022 04/05/22   [provider]  Calcium Carbonate (CALTRATE 600 PO) Take 600 mg by mouth daily.    [provider]  Cholecalciferol (VITAMIN D3) 25 MCG (1000 UT) CAPS Take 1,000 Units by mouth daily.    [provider]  cloNIDine (CATAPRES - DOSED IN MG/24 HR) 0.1 mg/24hr patch Place 1 patch (0.1 mg total) onto the skin once a week. Patient not taking: Reported on 10/18/2022 09/21/22   Charlynne Cousins, MD  Continuous Blood Gluc Receiver (FREESTYLE LIBRE 2 READER) [place] See admin instructions. 10/13/22   [provider]  Continuous Blood Gluc Sensor (FREESTYLE LIBRE 14 DAY SENSOR) MISC Inject 1 Device into the skin every 14 (fourteen) days.    [provider]  Cyanocobalamin (VITAMIN B-12) 1000 MCG SUBL Place 1 tablet under the tongue daily. Patient not taking: Reported on 10/18/2022    [provider]  cyanocobalamin 1000 MCG tablet Take 0.5 tablets (500 mcg total) by mouth daily. Patient not taking: Reported on 10/18/2022 09/16/22   Charlynne Cousins, MD  folic acid (FOLVITE) 1 MG tablet Take 1 tablet (1 mg total) by mouth daily. 09/16/22   Charlynne Cousins, MD  Glucagon HCl 1 MG SOLR Inject 1 mg into the skin as needed (for a blood sugar <67).  [provider]  insulin degludec (TRESIBA) 200 UNIT/ML FlexTouch Pen Inject 30 Units into the skin See admin instructions. Inject 30 units into the skin daily before brunch    [provider]  levothyroxine (SYNTHROID) 88 MCG tablet Take 88 mcg by mouth daily before breakfast.    [provider]  nitroGLYCERIN (NITROSTAT) 0.4 MG SL tablet Place 1 tablet (0.4 mg total) under the tongue every 5 (five) minutes as needed for chest pain. 01/20/21 09/12/22  C[place], Alonnah Lampkins C, PA-C  NOVOLOG FLEXPEN 100 UNIT/ML FlexPen Inject 5-10  Units into the skin See admin instructions. Inject 5 units into the skin before meals if BGL is 100-150, 7 units if BGL is 151-200, and 10 units if BGL is greater than 200 05/21/22   [provider]  sertraline (ZOLOFT) 50 MG tablet Take 150 mg by mouth at bedtime.    [provider]  thiamine (VITAMIN B1) 100 MG tablet Take 1 tablet (100 mg total) by mouth daily. 09/15/22   Charlynne Cousins, MD  TRELEGY ELLIPTA 200-62.5-25 MCG/ACT AEPB Inhale 1 puff into the lungs daily as needed (for flares).    [provider]  vancomycin (VANCOCIN) 125 MG capsule Take by mouth. 10/12/22   [provider]  vancomycin (VANCOCIN) 125 MG capsule Take 1 capsule (125 mg) by mouth every 3 days for 6 weeks: 11/01/22   Noralyn Pick, NP      Allergies    Latex, Onion, Other, Flexeril [cyclobenzaprine], Lisinopril, Metformin and related, Simvastatin, Tetracycline, and Zithromax [azithromycin]    Review of Systems   Review of Systems  Physical Exam Updated Vital Signs BP (!) 150/94   Pulse 78   Temp 97.8 F (36.6 C) (Oral)   Resp 18   Ht '5\' 5"'$  (1.651 m)   Wt 62.8 kg   SpO2 99%   BMI 23.04 kg/m  Physical Exam Vitals and nursing note reviewed.  Constitutional:      General: She is not in acute distress.    Appearance: She is well-developed.  HENT:     Head: Normocephalic and atraumatic.  Eyes:     Conjunctiva/sclera: Conjunctivae normal.  Cardiovascular:     Rate and Rhythm: Normal rate and regular rhythm.     Heart sounds: No murmur heard. Pulmonary:     Effort: Pulmonary effort is normal. No respiratory distress.     Breath sounds: Normal breath sounds.  Abdominal:     Palpations: Abdomen is soft.     Tenderness: There is no abdominal tenderness.  Musculoskeletal:        General: No swelling.     Cervical back: Neck supple.  Skin:    General: Skin is warm and dry.     Capillary Refill: Capillary refill takes less than 2 seconds.   Neurological:     General: No focal deficit present.     Mental Status: She is oriented to person, place, and time.     GCS: GCS eye subscore is 3. GCS verbal subscore is 5. GCS motor subscore is 6.     Comments: Patient is sleeping but easily arousable to voice and follows commands  She moves all extremities equally but has generalized weakness  Psychiatric:        Mood and Affect: Mood normal.     ED Results / Procedures / Treatments   Labs (all labs ordered are listed, but only abnormal results are displayed) Labs Reviewed  COMPREHENSIVE METABOLIC PANEL - Abnormal; Notable for the  following components:      Result Value   Glucose, Bld 221 (*)    BUN 30 (*)    Creatinine, Ser 1.78 (*)    GFR, Estimated 28 (*)    All other components within normal limits  CBC WITH DIFFERENTIAL/PLATELET - Abnormal; Notable for the following components:   Hemoglobin 11.8 (*)    All other components within normal limits  URINALYSIS, ROUTINE W REFLEX MICROSCOPIC - Abnormal; Notable for the following components:   Protein, ur 100 (*)    Leukocytes,Ua TRACE (*)    Bacteria, UA RARE (*)    All other components within normal limits  CBG MONITORING, ED - Abnormal; Notable for the following components:   Glucose-Capillary 206 (*)    All other components within normal limits  TROPONIN I (HIGH SENSITIVITY)  TROPONIN I (HIGH SENSITIVITY)    EKG EKG Interpretation  Date/Time:  Thursday December 30 2022 20:41:16 EST Ventricular Rate:  78 PR Interval:  144 QRS Duration: 87 QT Interval:  393 QTC Calculation: 448 R Axis:   -30 Text Interpretation: Sinus rhythm Left axis deviation Anterior infarct, old No acute changes No significant change since last tracing Confirmed by Varney Biles [phone]) on 12/30/2022 9:37:17 PM  Radiology DG Chest 1 View  Result Date: 12/30/2022 CLINICAL DATA:  Syncopal episode EXAM: CHEST  1 VIEW COMPARISON:  09/16/2019 FINDINGS: Cardiac shadow is mildly enlarged but  stable. Aortic calcifications are seen. Lungs are well aerated bilaterally. No bony abnormality is seen. IMPRESSION: No active disease. Electronically Signed   By: Inez Catalina M.D.   On: 12/30/2022 21:03    Procedures Procedures    Medications Ordered in ED Medications  sodium chloride 0.9 % bolus 500 mL (0 mLs Intravenous Stopped 12/30/22 1927)    ED Course/ Medical Decision Making/ A&P Clinical Course as of 12/30/22 2235  Thu Dec 30, 2022  1721 Troponin I (High Sensitivity) [CB]  1722 Comprehensive metabolic panel(!) [CB]    Clinical Course User Index [CB] Gwenevere Abbot, PA-C                             Medical Decision Making This patient presents to the ED for concern of near syncope, decreased mental status, this involves an extensive number of treatment options, and is a complaint that carries with it a high risk of complications and morbidity.  The differential diagnosis includes hypoglycemia, metabolic encephalopathy, dehydration, ACS, arrhythmia, other   Co morbidities that complicate the patient evaluation  Diabetes   Additional history obtained:  Additional history obtained from EMR External records from outside source obtained and reviewed including recent ED visit for dysuria, outpatient visits, previous echocardiogram results   Lab Tests:  I Ordered, and personally interpreted labs.  The pertinent results include: BUN and creatinine are slightly increased from baseline, normal baseline GFR is between 35 and 40 and is 28 today, hemoglobin is stable, glucose is 221, troponin is 12   Imaging Studies ordered:  I ordered imaging studies including chest x-ray I independently visualized and interpreted imaging which showed no pulmonary edema or infiltrate I agree with the radiologist interpretation   Cardiac Monitoring: / EKG:  The patient was maintained on a cardiac monitor.  I personally viewed and interpreted the cardiac monitored which showed an  underlying rhythm of: Sinus rhythm     Problem List / ED Course / Critical interventions / Medication management  Near syncope-patient was brought in by  EMS with daughter at bedside, she had a reported near syncopal event, she was awake and able to follow some commands during this but daughter states that normally she is able to move all extremities he was more somnolent than normal when she has her vasovagal episodes.  She is returned to baseline in the ER and is eating and drinking.  She did have some increase in her BUN and creatinine from baseline which has been worsening over the past several months.  She does have known CKD from her diabetes and is followed for this as an outpatient.  Given IV fluids here.  She had trace leukocytes and 6-10 white blood cells on her urine and is not having dysuria.  Do not need to treat this empirically but should reflex to culture.  Discussed findings with patient's daughter who is agreeable with plan of care and discharge.  Troponins are negative I ordered medication including NS  for malaise, increased BUN/Cr  Reevaluation of the patient after these medicines showed that the patient improved I have reviewed the patients home medicines and have made adjustments as needed     Test / Admission - Considered:  Admission but patient's daughter states that this seems like her usual presyncopal episodes and feels comfortable taking her home.  Patient is agreeable with going home as well, she is looking much better.  I consider that this could have been a seizure versus syncope but patient was reportedly responsive during the episode.  He had no focal deficits and no neurologic symptoms so I do not feel she needs any neuroimaging at this time.    Amount and/or Complexity of Data Reviewed Labs: ordered. Decision-making details documented in ED Course. Radiology: ordered.           Final Clinical Impression(s) / ED Diagnoses Final diagnoses:  Near  syncope  Elevated serum creatinine    Rx / DC Orders ED Discharge Orders     None         Darci Current 12/30/22 2236    Varney Biles, MD 12/30/22 2304

## 2022-12-30 NOTE — Discharge Instructions (Addendum)
Seen today after nearly passing out.  Your lab work showed elevated and your kidney function which has been going up over the past couple of months.  Some of this may be related to dehydration.  You were given IV fluids.  Avoid ibuprofen and Aleve which can cause kidney function to worsen.  Make sure you are drinking plenty of fluids.  You need to have your blood work checked by your primary care doctor closely.  You do not have appear to have a UTI, but this will be cultured and you can follow-up with your doctor for it.

## 2023-01-04 DIAGNOSIS — E039 Hypothyroidism, unspecified: Secondary | ICD-10-CM | POA: Diagnosis not present

## 2023-01-04 DIAGNOSIS — E785 Hyperlipidemia, unspecified: Secondary | ICD-10-CM | POA: Diagnosis not present

## 2023-01-04 DIAGNOSIS — I10 Essential (primary) hypertension: Secondary | ICD-10-CM | POA: Diagnosis not present

## 2023-01-04 DIAGNOSIS — E1140 Type 2 diabetes mellitus with diabetic neuropathy, unspecified: Secondary | ICD-10-CM | POA: Diagnosis not present

## 2023-01-04 DIAGNOSIS — I69354 Hemiplegia and hemiparesis following cerebral infarction affecting left non-dominant side: Secondary | ICD-10-CM | POA: Diagnosis not present

## 2023-01-04 DIAGNOSIS — A0472 Enterocolitis due to Clostridium difficile, not specified as recurrent: Secondary | ICD-10-CM | POA: Diagnosis not present

## 2023-01-05 ENCOUNTER — Other Ambulatory Visit (HOSPITAL_COMMUNITY): Payer: Self-pay

## 2023-01-06 ENCOUNTER — Telehealth: Payer: Self-pay

## 2023-01-06 NOTE — Telephone Encounter (Signed)
     Patient  visit on 2/1  at [facility] you been able to follow up with your primary care physician? Yes   The patient was or was not able to obtain any needed medicine or equipment. Yes   Are there diet recommendations that you are having difficulty following? Na   Patient expresses understanding of discharge instructions and education provided has no other needs at this time.  Yes      [facility] [phone] [address], [place], [place] [address] Phone: [phone] Email: [email or web address]

## 2023-01-07 DIAGNOSIS — E1121 Type 2 diabetes mellitus with diabetic nephropathy: Secondary | ICD-10-CM | POA: Diagnosis not present

## 2023-01-07 DIAGNOSIS — E538 Deficiency of other specified B group vitamins: Secondary | ICD-10-CM | POA: Diagnosis not present

## 2023-01-07 DIAGNOSIS — I10 Essential (primary) hypertension: Secondary | ICD-10-CM | POA: Diagnosis not present

## 2023-01-07 DIAGNOSIS — E0590 Thyrotoxicosis, unspecified without thyrotoxic crisis or storm: Secondary | ICD-10-CM | POA: Diagnosis not present

## 2023-01-07 DIAGNOSIS — E559 Vitamin D deficiency, unspecified: Secondary | ICD-10-CM | POA: Diagnosis not present

## 2023-01-09 DIAGNOSIS — G959 Disease of spinal cord, unspecified: Secondary | ICD-10-CM

## 2023-01-11 DIAGNOSIS — I10 Essential (primary) hypertension: Secondary | ICD-10-CM | POA: Diagnosis not present

## 2023-01-11 DIAGNOSIS — F0390 Unspecified dementia without behavioral disturbance: Secondary | ICD-10-CM | POA: Diagnosis not present

## 2023-01-11 DIAGNOSIS — I152 Hypertension secondary to endocrine disorders: Secondary | ICD-10-CM | POA: Diagnosis not present

## 2023-01-11 DIAGNOSIS — D509 Iron deficiency anemia, unspecified: Secondary | ICD-10-CM | POA: Diagnosis not present

## 2023-01-11 DIAGNOSIS — M25811 Other specified joint disorders, right shoulder: Secondary | ICD-10-CM | POA: Diagnosis not present

## 2023-01-11 DIAGNOSIS — E1143 Type 2 diabetes mellitus with diabetic autonomic (poly)neuropathy: Secondary | ICD-10-CM | POA: Diagnosis not present

## 2023-01-11 DIAGNOSIS — E11319 Type 2 diabetes mellitus with unspecified diabetic retinopathy without macular edema: Secondary | ICD-10-CM | POA: Diagnosis not present

## 2023-01-11 DIAGNOSIS — E1121 Type 2 diabetes mellitus with diabetic nephropathy: Secondary | ICD-10-CM | POA: Diagnosis not present

## 2023-01-11 DIAGNOSIS — E1159 Type 2 diabetes mellitus with other circulatory complications: Secondary | ICD-10-CM | POA: Diagnosis not present

## 2023-01-11 DIAGNOSIS — E039 Hypothyroidism, unspecified: Secondary | ICD-10-CM | POA: Diagnosis not present

## 2023-01-11 DIAGNOSIS — M25812 Other specified joint disorders, left shoulder: Secondary | ICD-10-CM | POA: Diagnosis not present

## 2023-01-11 DIAGNOSIS — E1165 Type 2 diabetes mellitus with hyperglycemia: Secondary | ICD-10-CM | POA: Diagnosis not present

## 2023-01-13 DIAGNOSIS — A0472 Enterocolitis due to Clostridium difficile, not specified as recurrent: Secondary | ICD-10-CM | POA: Diagnosis not present

## 2023-01-13 DIAGNOSIS — E785 Hyperlipidemia, unspecified: Secondary | ICD-10-CM | POA: Diagnosis not present

## 2023-01-13 DIAGNOSIS — E039 Hypothyroidism, unspecified: Secondary | ICD-10-CM | POA: Diagnosis not present

## 2023-01-13 DIAGNOSIS — I10 Essential (primary) hypertension: Secondary | ICD-10-CM | POA: Diagnosis not present

## 2023-01-13 DIAGNOSIS — I69354 Hemiplegia and hemiparesis following cerebral infarction affecting left non-dominant side: Secondary | ICD-10-CM | POA: Diagnosis not present

## 2023-01-13 DIAGNOSIS — E1140 Type 2 diabetes mellitus with diabetic neuropathy, unspecified: Secondary | ICD-10-CM | POA: Diagnosis not present

## 2023-01-19 DIAGNOSIS — E1140 Type 2 diabetes mellitus with diabetic neuropathy, unspecified: Secondary | ICD-10-CM | POA: Diagnosis not present

## 2023-01-19 DIAGNOSIS — I69354 Hemiplegia and hemiparesis following cerebral infarction affecting left non-dominant side: Secondary | ICD-10-CM | POA: Diagnosis not present

## 2023-01-19 DIAGNOSIS — E039 Hypothyroidism, unspecified: Secondary | ICD-10-CM | POA: Diagnosis not present

## 2023-01-19 DIAGNOSIS — I10 Essential (primary) hypertension: Secondary | ICD-10-CM | POA: Diagnosis not present

## 2023-01-19 DIAGNOSIS — E785 Hyperlipidemia, unspecified: Secondary | ICD-10-CM | POA: Diagnosis not present

## 2023-01-19 DIAGNOSIS — A0472 Enterocolitis due to Clostridium difficile, not specified as recurrent: Secondary | ICD-10-CM | POA: Diagnosis not present

## 2023-01-21 DIAGNOSIS — E785 Hyperlipidemia, unspecified: Secondary | ICD-10-CM | POA: Diagnosis not present

## 2023-01-21 DIAGNOSIS — R32 Unspecified urinary incontinence: Secondary | ICD-10-CM | POA: Diagnosis not present

## 2023-01-21 DIAGNOSIS — E1140 Type 2 diabetes mellitus with diabetic neuropathy, unspecified: Secondary | ICD-10-CM | POA: Diagnosis not present

## 2023-01-21 DIAGNOSIS — M19012 Primary osteoarthritis, left shoulder: Secondary | ICD-10-CM | POA: Diagnosis not present

## 2023-01-21 DIAGNOSIS — E559 Vitamin D deficiency, unspecified: Secondary | ICD-10-CM | POA: Diagnosis not present

## 2023-01-21 DIAGNOSIS — E039 Hypothyroidism, unspecified: Secondary | ICD-10-CM | POA: Diagnosis not present

## 2023-01-21 DIAGNOSIS — I10 Essential (primary) hypertension: Secondary | ICD-10-CM | POA: Diagnosis not present

## 2023-01-21 DIAGNOSIS — I69354 Hemiplegia and hemiparesis following cerebral infarction affecting left non-dominant side: Secondary | ICD-10-CM | POA: Diagnosis not present

## 2023-01-21 DIAGNOSIS — K760 Fatty (change of) liver, not elsewhere classified: Secondary | ICD-10-CM | POA: Diagnosis not present

## 2023-01-21 DIAGNOSIS — Z7982 Long term (current) use of aspirin: Secondary | ICD-10-CM | POA: Diagnosis not present

## 2023-01-21 DIAGNOSIS — Z794 Long term (current) use of insulin: Secondary | ICD-10-CM | POA: Diagnosis not present

## 2023-01-21 DIAGNOSIS — Z792 Long term (current) use of antibiotics: Secondary | ICD-10-CM | POA: Diagnosis not present

## 2023-01-21 DIAGNOSIS — E538 Deficiency of other specified B group vitamins: Secondary | ICD-10-CM | POA: Diagnosis not present

## 2023-01-21 DIAGNOSIS — H9193 Unspecified hearing loss, bilateral: Secondary | ICD-10-CM | POA: Diagnosis not present

## 2023-01-21 DIAGNOSIS — Z9181 History of falling: Secondary | ICD-10-CM | POA: Diagnosis not present

## 2023-01-21 DIAGNOSIS — A0472 Enterocolitis due to Clostridium difficile, not specified as recurrent: Secondary | ICD-10-CM | POA: Diagnosis not present

## 2023-01-21 NOTE — Progress Notes (Signed)
Rescheduled 01/28/23  [place]  Direct Dial: [phone]

## 2023-01-25 DIAGNOSIS — I10 Essential (primary) hypertension: Secondary | ICD-10-CM | POA: Diagnosis not present

## 2023-01-25 DIAGNOSIS — F0390 Unspecified dementia without behavioral disturbance: Secondary | ICD-10-CM | POA: Diagnosis not present

## 2023-01-25 DIAGNOSIS — F331 Major depressive disorder, recurrent, moderate: Secondary | ICD-10-CM | POA: Diagnosis not present

## 2023-01-25 DIAGNOSIS — F411 Generalized anxiety disorder: Secondary | ICD-10-CM | POA: Diagnosis not present

## 2023-01-25 DIAGNOSIS — G4700 Insomnia, unspecified: Secondary | ICD-10-CM | POA: Diagnosis not present

## 2023-01-27 DIAGNOSIS — E785 Hyperlipidemia, unspecified: Secondary | ICD-10-CM | POA: Diagnosis not present

## 2023-01-27 DIAGNOSIS — I69354 Hemiplegia and hemiparesis following cerebral infarction affecting left non-dominant side: Secondary | ICD-10-CM | POA: Diagnosis not present

## 2023-01-27 DIAGNOSIS — E1140 Type 2 diabetes mellitus with diabetic neuropathy, unspecified: Secondary | ICD-10-CM | POA: Diagnosis not present

## 2023-01-27 DIAGNOSIS — I10 Essential (primary) hypertension: Secondary | ICD-10-CM | POA: Diagnosis not present

## 2023-01-27 DIAGNOSIS — A0472 Enterocolitis due to Clostridium difficile, not specified as recurrent: Secondary | ICD-10-CM | POA: Diagnosis not present

## 2023-01-27 DIAGNOSIS — E039 Hypothyroidism, unspecified: Secondary | ICD-10-CM | POA: Diagnosis not present

## 2023-01-28 ENCOUNTER — Ambulatory Visit: Payer: Self-pay

## 2023-01-28 NOTE — Patient Outreach (Signed)
  Care Coordination   Follow Up Visit Note   01/28/2023 Name: Diana Harper MRN: [id number] DOB: January 27, 1936  Diana Harper is a 87 y.o. year old female who sees [name], MD for primary care. I spoke with  [name] and daughter [name] by phone today.  What matters to the patients health and wellness today?  Patient's daughter will learn how to accurately check her mother's BP at home. Patient will increase her daily water intake as directed. She will follow Urology recommendations related to urinary incontinence.     Goals Addressed             This Visit's Progress    To accurately monitor BP at home       Care Coordination Interventions: Evaluation of current treatment plan related to hypertension self management and patient's adherence to plan as established by provider Reviewed medications with patient and discussed importance of compliance Provided assistance with obtaining home blood pressure monitor via [place]; Advised patient, providing education and rationale, to monitor blood pressure daily and record, calling PCP for findings outside established parameters Provided education on prescribed diet low Sodium  Discussed complications of poorly controlled blood pressure such as heart disease, stroke, circulatory complications, vision complications, kidney impairment, sexual dysfunction        To complete evaluation with Urology       Care Coordination Interventions: Placed successful outbound call with daughter [name]  Evaluation of current treatment plan related to UTI and dysuria and patient's adherence to plan as established by provider Determined patient has experienced an additional ED visit since last RN CC follow up Determined patient experienced a syncope episode with unknown etiology, her UTI has cleared Review of patient status, including review of consultant's reports, relevant laboratory and other test results, and medications  completed Reviewed scheduled/upcoming provider appointments including: follow up call with PCP, [facility] and Cardiology Educated patient regarding importance to drink 48-64 oz of water daily unless otherwise directed Reviewed MD recommendations with daughter and patient per Urology MD Answered patient's questions regarding what is Palliative Care        Interventions Today    Flowsheet Row Most Recent Value  Chronic Disease   Chronic disease during today's visit Chronic Kidney Disease/End Stage Renal Disease (ESRD), Hypertension (HTN), Other  [Syncope, urinary incontinence]  General Interventions   General Interventions Discussed/Reviewed General Interventions Discussed, General Interventions Reviewed, Doctor Visits  Doctor Visits Discussed/Reviewed Doctor Visits Discussed, Doctor Visits Reviewed, PCP, Specialist  PCP/Specialist Visits Compliance with follow-up visit  Education Interventions   Education Provided Provided Printed Education, Provided Education  Provided Verbal Education On Other  [How to Accruately Monitor BP at home]  Nutrition Interventions   Nutrition Discussed/Reviewed Fluid intake  Pharmacy Interventions   Pharmacy Dicussed/Reviewed Medications and their functions           SDOH assessments and interventions completed:  No     Care Coordination Interventions:  Yes, provided   Follow up plan: Follow up call scheduled for 02/11/23 '@1'$ :00 PM    Encounter Outcome:  Pt. Visit Completed

## 2023-01-28 NOTE — Patient Instructions (Signed)
Visit Information  Thank you for taking time to visit with me today. Please don't hesitate to contact me if I can be of assistance to you.   Following are the goals we discussed today:   Goals Addressed             This Visit's Progress    To accurately monitor BP at home       Care Coordination Interventions: Evaluation of current treatment plan related to hypertension self management and patient's adherence to plan as established by provider Reviewed medications with patient and discussed importance of compliance Provided assistance with obtaining home blood pressure monitor via [facility]; Advised patient, providing education and rationale, to monitor blood pressure daily and record, calling PCP for findings outside established parameters Provided education on prescribed diet low Sodium  Discussed complications of poorly controlled blood pressure such as heart disease, stroke, circulatory complications, vision complications, kidney impairment, sexual dysfunction        To complete evaluation with Urology       Care Coordination Interventions: Placed successful outbound call with daughter [name]  Evaluation of current treatment plan related to UTI and dysuria and patient's adherence to plan as established by provider Determined patient has experienced an additional ED visit since last RN CC follow up Determined patient experienced a syncope episode with unknown etiology, her UTI has cleared Review of patient status, including review of consultant's reports, relevant laboratory and other test results, and medications completed Reviewed scheduled/upcoming provider appointments including: follow up call with PCP, [facility] and Cardiology Educated patient regarding importance to drink 48-64 oz of water daily unless otherwise directed Reviewed MD recommendations with daughter and patient per Urology MD Answered patient's questions regarding what is Palliative Care             Our next appointment is by telephone on 02/11/23 at 1:00 PM  Please call the care guide team at [phone] if you need to cancel or reschedule your appointment.   If you are experiencing a Mental Health or [place] or need someone to talk to, please call 1-800-273-TALK (toll free, 24 hour hotline) go to [facility] [address] [place] [phone])  Patient verbalizes understanding of instructions and care plan provided today and agrees to view in [place]. Active MyChart status and patient understanding of how to access instructions and care plan via MyChart confirmed with patient.     Barb Merino, RN, BSN, CCM Care Management Coordinator [facility] Care Management Direct Phone: [phone]

## 2023-02-01 DIAGNOSIS — A0472 Enterocolitis due to Clostridium difficile, not specified as recurrent: Secondary | ICD-10-CM | POA: Diagnosis not present

## 2023-02-01 DIAGNOSIS — E039 Hypothyroidism, unspecified: Secondary | ICD-10-CM | POA: Diagnosis not present

## 2023-02-01 DIAGNOSIS — E1140 Type 2 diabetes mellitus with diabetic neuropathy, unspecified: Secondary | ICD-10-CM | POA: Diagnosis not present

## 2023-02-01 DIAGNOSIS — I10 Essential (primary) hypertension: Secondary | ICD-10-CM | POA: Diagnosis not present

## 2023-02-01 DIAGNOSIS — I69354 Hemiplegia and hemiparesis following cerebral infarction affecting left non-dominant side: Secondary | ICD-10-CM | POA: Diagnosis not present

## 2023-02-01 DIAGNOSIS — E785 Hyperlipidemia, unspecified: Secondary | ICD-10-CM | POA: Diagnosis not present

## 2023-02-11 ENCOUNTER — Ambulatory Visit: Payer: Self-pay

## 2023-02-11 NOTE — Patient Instructions (Signed)
Visit Information  Thank you for taking time to visit with me today. Please don't hesitate to contact me if I can be of assistance to you.   Following are the goals we discussed today:   Goals Addressed             This Visit's Progress    To accurately monitor BP at home       Care Coordination Interventions: Completed successful outbound call with daughter [name]  Evaluation of current treatment plan related to hypertension self management and patient's adherence to plan as established by provider Determined patient received her BP cuff via [place] [facility] Discussed daughter [name] is monitoring patient's BP daily and recording readings Reviewed and discussed readings taken and recorded, noted some elevated readings Advised daughter, providing education and rationale, to monitor blood pressure daily and record, calling PCP for findings outside established parameters Reviewed upcoming scheduled Cardiology appointment with Dr. Johney Frame scheduled for the end of April, [name] will provide transportation and accompany patient to the appointment  Last practice recorded BP readings:  BP Readings from Last 3 Encounters:  12/30/22 (!) 150/94  11/10/22 121/81  10/18/22 120/68  Most recent eGFR/CrCl: No results found for: "EGFR"  No components found for: "CRCL"              Our next appointment is by telephone on 04/05/23 at 1:30 PM  Please call the care guide team at [phone] if you need to cancel or reschedule your appointment.   If you are experiencing a Mental Health or [place] or need someone to talk to, please call 1-800-273-TALK (toll free, 24 hour hotline) go to [facility] [address] [place] [phone])  Patient verbalizes understanding of instructions and care plan provided today and agrees to view in [place]. Active MyChart status and patient understanding of how to access instructions and care  plan via MyChart confirmed with patient.     Barb Merino, RN, BSN, CCM Care Management Coordinator [facility] Care Management Direct Phone: [phone]

## 2023-02-11 NOTE — Patient Outreach (Addendum)
  Care Coordination   Follow Up Visit Note   02/11/2023 Name: Diana Harper MRN: [id number] DOB: 07-26-1936  Diana Harper is a 87 y.o. year old female who sees Diana Bien, MD for primary care. I spoke with daughter Diana Harper by phone today.  What matters to the patients health and wellness today?  Patient's daughter Diana Harper will continue to monitor her BP at home and report abnormal readings to PCP and or Cardiology.     Goals Addressed             This Visit's Progress    To accurately monitor BP at home       Care Coordination Interventions: Completed successful outbound call with daughter Diana Harper  Evaluation of current treatment plan related to hypertension self management and patient's adherence to plan as established by provider Determined patient received her BP cuff via [facility] Care Management Discussed daughter Diana Harper is monitoring patient's BP daily and recording readings Reviewed and discussed readings taken and recorded, noted some elevated readings Advised daughter, providing education and rationale, to monitor blood pressure daily and record, calling PCP for findings outside established parameters Reviewed upcoming scheduled Cardiology appointment with Diana Harper scheduled for the end of April, Diana Harper will provide transportation and accompany patient to the appointment  Last practice recorded BP readings:  BP Readings from Last 3 Encounters:  12/30/22 (!) 150/94  11/10/22 121/81  10/18/22 120/68  Most recent eGFR/CrCl: No results found for: "EGFR"  No components found for: "CRCL"     Interventions Today    Flowsheet Row Most Recent Value  Chronic Disease   Chronic disease during today's visit Hypertension (HTN)  General Interventions   General Interventions Discussed/Reviewed General Interventions Discussed, General Interventions Reviewed, Doctor Visits  Doctor Visits Discussed/Reviewed Doctor Visits Discussed, Doctor Visits Reviewed, PCP,  Specialist  Education Interventions   Education Provided Provided Education  Provided Verbal Education On When to see the doctor          SDOH assessments and interventions completed:  No     Care Coordination Interventions:  Yes, provided   Follow up plan: Follow up call scheduled for 04/05/23 @1 :30 PM    Encounter Outcome:  Pt. Visit Completed

## 2023-02-16 ENCOUNTER — Ambulatory Visit: Payer: Medicare Other | Admitting: Gastroenterology

## 2023-03-09 DIAGNOSIS — E039 Hypothyroidism, unspecified: Secondary | ICD-10-CM | POA: Diagnosis not present

## 2023-03-09 DIAGNOSIS — N1831 Chronic kidney disease, stage 3a: Secondary | ICD-10-CM | POA: Diagnosis not present

## 2023-03-09 DIAGNOSIS — Z794 Long term (current) use of insulin: Secondary | ICD-10-CM | POA: Diagnosis not present

## 2023-03-09 DIAGNOSIS — E1122 Type 2 diabetes mellitus with diabetic chronic kidney disease: Secondary | ICD-10-CM | POA: Diagnosis not present

## 2023-03-25 DIAGNOSIS — I10 Essential (primary) hypertension: Secondary | ICD-10-CM | POA: Diagnosis not present

## 2023-03-25 DIAGNOSIS — J069 Acute upper respiratory infection, unspecified: Secondary | ICD-10-CM | POA: Diagnosis not present

## 2023-03-25 DIAGNOSIS — Z1159 Encounter for screening for other viral diseases: Secondary | ICD-10-CM | POA: Diagnosis not present

## 2023-03-25 DIAGNOSIS — J45909 Unspecified asthma, uncomplicated: Secondary | ICD-10-CM | POA: Diagnosis not present

## 2023-03-29 DIAGNOSIS — N1832 Chronic kidney disease, stage 3b: Secondary | ICD-10-CM | POA: Diagnosis not present

## 2023-03-29 DIAGNOSIS — E1165 Type 2 diabetes mellitus with hyperglycemia: Secondary | ICD-10-CM | POA: Diagnosis not present

## 2023-03-29 DIAGNOSIS — I129 Hypertensive chronic kidney disease with stage 1 through stage 4 chronic kidney disease, or unspecified chronic kidney disease: Secondary | ICD-10-CM | POA: Diagnosis not present

## 2023-03-29 DIAGNOSIS — N179 Acute kidney failure, unspecified: Secondary | ICD-10-CM | POA: Diagnosis not present

## 2023-03-29 DIAGNOSIS — E1122 Type 2 diabetes mellitus with diabetic chronic kidney disease: Secondary | ICD-10-CM | POA: Diagnosis not present

## 2023-03-29 DIAGNOSIS — E039 Hypothyroidism, unspecified: Secondary | ICD-10-CM | POA: Diagnosis not present

## 2023-03-29 DIAGNOSIS — D638 Anemia in other chronic diseases classified elsewhere: Secondary | ICD-10-CM | POA: Diagnosis not present

## 2023-04-01 DIAGNOSIS — N1832 Chronic kidney disease, stage 3b: Secondary | ICD-10-CM | POA: Diagnosis not present

## 2023-04-05 ENCOUNTER — Ambulatory Visit: Payer: Self-pay

## 2023-04-05 NOTE — Patient Outreach (Signed)
  Care Coordination   04/05/2023 Name: Earlyne Ferderer MRN: [id number] DOB: January 01, 1936   Care Coordination Outreach Attempts:  An unsuccessful telephone outreach was attempted for a scheduled appointment today.  Follow Up Plan:  Additional outreach attempts will be made to offer the patient care coordination information and services.   Encounter Outcome:  No Answer   Care Coordination Interventions:  No, not indicated    Delsa Sale, RN, BSN, CCM Care Management Coordinator [facility] Care Management Direct Phone: [phone]

## 2023-04-18 ENCOUNTER — Observation Stay (HOSPITAL_COMMUNITY)
Admission: EM | Admit: 2023-04-18 | Discharge: 2023-04-21 | Disposition: A | Discharge status: Home Health | Payer: Medicare Other | Attending: Student | Admitting: Student

## 2023-04-18 ENCOUNTER — Other Ambulatory Visit: Payer: Self-pay

## 2023-04-18 ENCOUNTER — Inpatient Hospital Stay (HOSPITAL_COMMUNITY): Payer: Medicare Other

## 2023-04-18 ENCOUNTER — Emergency Department (HOSPITAL_COMMUNITY): Payer: Medicare Other

## 2023-04-18 ENCOUNTER — Encounter (HOSPITAL_COMMUNITY): Payer: Self-pay

## 2023-04-18 DIAGNOSIS — R55 Syncope and collapse: Principal | ICD-10-CM

## 2023-04-18 DIAGNOSIS — Z8673 Personal history of transient ischemic attack (TIA), and cerebral infarction without residual deficits: Secondary | ICD-10-CM | POA: Diagnosis present

## 2023-04-18 DIAGNOSIS — E1165 Type 2 diabetes mellitus with hyperglycemia: Secondary | ICD-10-CM

## 2023-04-18 DIAGNOSIS — Z794 Long term (current) use of insulin: Secondary | ICD-10-CM | POA: Diagnosis not present

## 2023-04-18 DIAGNOSIS — I63311 Cerebral infarction due to thrombosis of right middle cerebral artery: Principal | ICD-10-CM | POA: Insufficient documentation

## 2023-04-18 DIAGNOSIS — N179 Acute kidney failure, unspecified: Secondary | ICD-10-CM | POA: Diagnosis not present

## 2023-04-18 DIAGNOSIS — R569 Unspecified convulsions: Secondary | ICD-10-CM | POA: Diagnosis not present

## 2023-04-18 DIAGNOSIS — I959 Hypotension, unspecified: Secondary | ICD-10-CM | POA: Diagnosis not present

## 2023-04-18 DIAGNOSIS — Z91040 Latex allergy status: Secondary | ICD-10-CM | POA: Diagnosis not present

## 2023-04-18 DIAGNOSIS — N184 Chronic kidney disease, stage 4 (severe): Secondary | ICD-10-CM | POA: Insufficient documentation

## 2023-04-18 DIAGNOSIS — E039 Hypothyroidism, unspecified: Secondary | ICD-10-CM | POA: Insufficient documentation

## 2023-04-18 DIAGNOSIS — D649 Anemia, unspecified: Secondary | ICD-10-CM | POA: Insufficient documentation

## 2023-04-18 DIAGNOSIS — J45909 Unspecified asthma, uncomplicated: Secondary | ICD-10-CM | POA: Insufficient documentation

## 2023-04-18 DIAGNOSIS — R770 Abnormality of albumin: Secondary | ICD-10-CM | POA: Diagnosis not present

## 2023-04-18 DIAGNOSIS — I639 Cerebral infarction, unspecified: Secondary | ICD-10-CM | POA: Diagnosis not present

## 2023-04-18 DIAGNOSIS — E1122 Type 2 diabetes mellitus with diabetic chronic kidney disease: Secondary | ICD-10-CM | POA: Diagnosis not present

## 2023-04-18 DIAGNOSIS — I130 Hypertensive heart and chronic kidney disease with heart failure and stage 1 through stage 4 chronic kidney disease, or unspecified chronic kidney disease: Secondary | ICD-10-CM | POA: Diagnosis not present

## 2023-04-18 DIAGNOSIS — E785 Hyperlipidemia, unspecified: Secondary | ICD-10-CM | POA: Diagnosis not present

## 2023-04-18 DIAGNOSIS — Z79899 Other long term (current) drug therapy: Secondary | ICD-10-CM | POA: Insufficient documentation

## 2023-04-18 DIAGNOSIS — E872 Acidosis, unspecified: Secondary | ICD-10-CM | POA: Diagnosis not present

## 2023-04-18 DIAGNOSIS — R4182 Altered mental status, unspecified: Secondary | ICD-10-CM | POA: Diagnosis not present

## 2023-04-18 DIAGNOSIS — I668 Occlusion and stenosis of other cerebral arteries: Secondary | ICD-10-CM | POA: Diagnosis not present

## 2023-04-18 DIAGNOSIS — R410 Disorientation, unspecified: Secondary | ICD-10-CM | POA: Diagnosis not present

## 2023-04-18 DIAGNOSIS — I5030 Unspecified diastolic (congestive) heart failure: Secondary | ICD-10-CM | POA: Diagnosis not present

## 2023-04-18 DIAGNOSIS — R0902 Hypoxemia: Secondary | ICD-10-CM | POA: Diagnosis not present

## 2023-04-18 LAB — CBC WITH DIFFERENTIAL/PLATELET
Abs Immature Granulocytes: 0.03 10*3/uL (ref 0.00–0.07)
Basophils Absolute: 0 10*3/uL (ref 0.0–0.1)
Basophils Relative: 1 %
Eosinophils Absolute: 0.1 10*3/uL (ref 0.0–0.5)
Eosinophils Relative: 2 %
HCT: 33.9 % — ABNORMAL LOW (ref 36.0–46.0)
Hemoglobin: 10.9 g/dL — ABNORMAL LOW (ref 12.0–15.0)
Immature Granulocytes: 1 %
Lymphocytes Relative: 34 %
Lymphs Abs: 2.2 10*3/uL (ref 0.7–4.0)
MCH: 28.6 pg (ref 26.0–34.0)
MCHC: 32.2 g/dL (ref 30.0–36.0)
MCV: 89 fL (ref 80.0–100.0)
Monocytes Absolute: 0.5 10*3/uL (ref 0.1–1.0)
Monocytes Relative: 7 %
Neutro Abs: 3.6 10*3/uL (ref 1.7–7.7)
Neutrophils Relative %: 55 %
Platelets: 194 10*3/uL (ref 150–400)
RBC: 3.81 MIL/uL — ABNORMAL LOW (ref 3.87–5.11)
RDW: 13.7 % (ref 11.5–15.5)
WBC: 6.4 10*3/uL (ref 4.0–10.5)
nRBC: 0 % (ref 0.0–0.2)

## 2023-04-18 LAB — COMPREHENSIVE METABOLIC PANEL
ALT: 19 U/L (ref 0–44)
AST: 25 U/L (ref 15–41)
Albumin: 3.1 g/dL — ABNORMAL LOW (ref 3.5–5.0)
Alkaline Phosphatase: 53 U/L (ref 38–126)
Anion gap: 9 (ref 5–15)
BUN: 24 mg/dL — ABNORMAL HIGH (ref 8–23)
CO2: 26 mmol/L (ref 22–32)
Calcium: 9 mg/dL (ref 8.9–10.3)
Chloride: 104 mmol/L (ref 98–111)
Creatinine, Ser: 2.05 mg/dL — ABNORMAL HIGH (ref 0.44–1.00)
GFR, Estimated: 23 mL/min — ABNORMAL LOW (ref >60)
Glucose, Bld: 192 mg/dL — ABNORMAL HIGH (ref 70–99)
Potassium: 3.7 mmol/L (ref 3.5–5.1)
Sodium: 139 mmol/L (ref 135–145)
Total Bilirubin: 0.4 mg/dL (ref 0.3–1.2)
Total Protein: 6.3 g/dL — ABNORMAL LOW (ref 6.5–8.1)

## 2023-04-18 LAB — BRAIN NATRIURETIC PEPTIDE: B Natriuretic Peptide: 18 pg/mL (ref 0.0–100.0)

## 2023-04-18 LAB — I-STAT CHEM 8, ED
BUN: 29 mg/dL — ABNORMAL HIGH (ref 8–23)
Calcium, Ion: 1.27 mmol/L (ref 1.15–1.40)
Chloride: 105 mmol/L (ref 98–111)
Creatinine, Ser: 2.1 mg/dL — ABNORMAL HIGH (ref 0.44–1.00)
Glucose, Bld: 177 mg/dL — ABNORMAL HIGH (ref 70–99)
HCT: 31 % — ABNORMAL LOW (ref 36.0–46.0)
Hemoglobin: 10.5 g/dL — ABNORMAL LOW (ref 12.0–15.0)
Potassium: 3.6 mmol/L (ref 3.5–5.1)
Sodium: 141 mmol/L (ref 135–145)
TCO2: 29 mmol/L (ref 22–32)

## 2023-04-18 LAB — LIPID PANEL
Cholesterol: 215 mg/dL — ABNORMAL HIGH (ref 0–200)
HDL: 31 mg/dL — ABNORMAL LOW (ref >40)
LDL Cholesterol: 136 mg/dL — ABNORMAL HIGH (ref 0–99)
Total CHOL/HDL Ratio: 6.9 RATIO
Triglycerides: 239 mg/dL — ABNORMAL HIGH (ref <150)
VLDL: 48 mg/dL — ABNORMAL HIGH (ref 0–40)

## 2023-04-18 LAB — TROPONIN I (HIGH SENSITIVITY)
Troponin I (High Sensitivity): 14 ng/L (ref <18)
Troponin I (High Sensitivity): 14 ng/L (ref <18)

## 2023-04-18 LAB — HEMOGLOBIN A1C
Hgb A1c MFr Bld: 7.8 % — ABNORMAL HIGH (ref 4.8–5.6)
Mean Plasma Glucose: 177.16 mg/dL

## 2023-04-18 MED ORDER — CLOPIDOGREL BISULFATE 75 MG PO TABS
75.0000 mg | ORAL_TABLET | Freq: Every day | ORAL | Status: DC
  Administered 2023-04-18 – 2023-04-21 (×4): 75 mg via ORAL
  Filled 2023-04-18 (×4): qty 1

## 2023-04-18 MED ORDER — ASPIRIN 81 MG PO TBEC
81.0000 mg | DELAYED_RELEASE_TABLET | Freq: Every day | ORAL | Status: DC
  Administered 2023-04-18 – 2023-04-21 (×4): 81 mg via ORAL
  Filled 2023-04-18 (×4): qty 1

## 2023-04-18 MED ORDER — SODIUM CHLORIDE 0.9 % IV BOLUS
1000.0000 mL | Freq: Once | INTRAVENOUS | Status: AC
  Administered 2023-04-18: 1000 mL via INTRAVENOUS

## 2023-04-18 NOTE — Consult Note (Signed)
NEUROLOGY CONSULTATION NOTE   Date of service: Apr 18, 2023 Patient Name: Diana Harper MRN:  [id number] DOB:  1936-09-24 Reason for consult: "R BG infarct along with scattered R MCA stroke" Requesting Provider: Wynetta Fines, MD _ _ _   _ __   _ __ _ _  __ __   _ __   __ _  History of Present Illness  Diana Harper is a 87 y.o. female with PMH significant for CKD, DM2, prior stroke, HTN, HLD, who presents with 2 episodes of passing out with convulsions.  Patient with dementia and poor recollection of events this AM. Daughter reports that she had a small bowel movement at the bedside commode and sat on the cushion to eat her brunch. pt called for her daughter and daughter went in to check on her to find her convulsing in the seat with her head leaning back. She had convulsions for 2 mins and then she was passed out for another 2 mins and then lethargic and tired. EMS arrived and she was coming around when they were checking vitals on her. She then had a large bowel movements and EMS held her up while daugther cleaned her. EMS were moving her when she had another syncopal episode. EMS brought her in to the hospital for an evaluation.  She was brought in to ED where MRI Brain demonstrated R caudate infarct along with scattered small R MCA stroke.  She has had prior episodes of vasovagal syncope, about 4 in total. Never had convulsions in the past. No hx of seizures, no family hx of seizures.  Daughter reports poor po fluid intake, despite encouragement from them. Creatinine at her visit with nephrology was up at 1.8 and today is elevated to over 2.  LKW: not applicable since no symptoms/deficit mRS: 3 tNKASE: not offered, no symptoms. Thrombectomy: not offered, no symptoms. NIHSS components Score: Comment  1a Level of Conscious 0[x]  1[]  2[]  3[]      1b LOC Questions 0[]  1[]  2[x]       1c LOC Commands 0[x]  1[]  2[]       2 Best Gaze 0[x]  1[]  2[]       3 Visual 0[x]  1[]  2[]  3[]      4 Facial  Palsy 0[x]  1[]  2[]  3[]      5a Motor Arm - left 0[x]  1[]  2[]  3[]  4[]  UN[]    5b Motor Arm - Right 0[x]  1[]  2[]  3[]  4[]  UN[]    6a Motor Leg - Left 0[x]  1[]  2[]  3[]  4[]  UN[]    6b Motor Leg - Right 0[x]  1[]  2[]  3[]  4[]  UN[]    7 Limb Ataxia 0[x]  1[]  2[]  3[]  UN[]     8 Sensory 0[x]  1[]  2[]  UN[]      9 Best Language 0[x]  1[]  2[]  3[]      10 Dysarthria 0[]  1[x]  2[]  UN[]    Some slurring of words, probably since she did not have her teeth in.  11 Extinct. and Inattention 0[x]  1[]  2[]       TOTAL:        ROS   Constitutional Denies weight loss, fever and chills.   HEENT Denies changes in vision and hearing.   Respiratory Denies SOB and cough.   CV Denies palpitations and CP   GI Denies abdominal pain, nausea, vomiting and diarrhea.   GU Denies dysuria and urinary frequency.   MSK Denies myalgia and joint pain.   Skin Denies rash and pruritus.   Neurological Denies headache and syncope.   Psychiatric Denies recent changes in mood.  Denies anxiety and depression.    Past History   Past Medical History:  Diagnosis Date   Anemia    Anxiety    Arthritis    Asthma    Biliary colic    C. difficile colitis 08/2022   Cholelithiasis    CKD (chronic kidney disease), stage III [facility])    nephrologist--  dr Reynolds Bowl   Colon polyps    Diverticulosis of colon    Gastric ulcer    Glaucoma    History of asthma    1980's  no longer problem since 1980's   History of diverticulitis of colon    recurrent--  2014;  2013;  2012   Hyperlipidemia    Hypertension    Hypothyroidism    Insulin dependent type 2 diabetes mellitus (HCC)    followed by dr Jonny Ruiz lambeth ([facility])    Murmur    Peripheral neuropathy    Stroke [facility])    Vitamin D deficiency    Wears hearing aid    bilateral   Wears partial dentures    lower partial and upper full   Past Surgical History:  Procedure Laterality Date   ABDOMINAL HYSTERECTOMY  1970's   CARDIOVASCULAR STRESS TEST  07/30/2009   normal exercise lexiscan nuclear  study w/ no ischemia/  normal LV funciton and wall motion, ef 77%   CATARACT EXTRACTION W/ INTRAOCULAR LENS  IMPLANT, BILATERAL  2014   LAPAROSCOPIC CHOLECYSTECTOMY SINGLE SITE WITH INTRAOPERATIVE CHOLANGIOGRAM N/A 08/25/2016   Procedure: LAPAROSCOPIC CHOLECYSTECTOMY WITH INTRAOPERATIVE CHOLANGIOGRAM;  Surgeon: Rodman Pickle, MD;  Location: [facility];  Service: General;  Laterality: N/A;   LUMBAR SPINE SURGERY  2014   REMOVAL AXILLA CYST Right 1990's   Family History  Problem Relation Age of Onset   Stroke Mother    Colon polyps Mother    Diabetes Mother    Diabetes Father    Kidney failure Father    COPD Father    Colon polyps Sister    Diabetes Sister    Stroke Brother    Diabetes Brother    Heart attack Maternal Grandmother    Heart disease Maternal Grandfather    Diabetes Paternal Grandmother    Stroke Paternal Grandfather    Hypertension Paternal Grandfather    Diabetes Sister    Diabetes Sister    Diabetes Son    Diabetes Daughter    Colon cancer Neg Hx    Esophageal cancer Neg Hx    Inflammatory bowel disease Neg Hx    Liver disease Neg Hx    Pancreatic cancer Neg Hx    Rectal cancer Neg Hx    Stomach cancer Neg Hx    Social History   Socioeconomic History   Marital status: Widowed    Spouse name: Not on file   Number of children: 4   Years of education: Not on file   Highest education level: Not on file  Occupational History   Occupation: retired  Tobacco Use   Smoking status: Never   Smokeless tobacco: Never  Vaping Use   Vaping Use: Never used  Substance and Sexual Activity   Alcohol use: No   Drug use: No   Sexual activity: Not on file  Other Topics Concern   Not on file  Social History Narrative   Not on file   Social Determinants of Health   Financial Resource Strain: Not on file  Food Insecurity: Unknown (09/14/2022)   Hunger Vital Sign    Worried About  Running Out of Food in the Last Year: Never true    Ran Out of  Food in the Last Year: Patient declined  Transportation Needs: Patient Declined (09/14/2022)   PRAPARE - Administrator, Civil Service (Medical): Patient declined    Lack of Transportation (Non-Medical): Patient declined  Physical Activity: Not on file  Stress: Not on file  Social Connections: Not on file   Allergies  Allergen Reactions   Latex Anaphylaxis, Shortness Of Breath and Other (See Comments)    Severe respiratory distress   Onion Diarrhea   Other Other (See Comments)    Patient cannot have TREE NUTS and anything with seeds- History of  diverticulitis    Flexeril [Cyclobenzaprine] Other (See Comments)    Caused agitation   Lisinopril Cough   Metformin And Related Diarrhea   Simvastatin Hives and Rash   Tetracycline Rash   Zithromax [Azithromycin] Rash    Medications  (Not in a hospital admission)    Vitals   Vitals:   04/18/23 1645 04/18/23 1700 04/18/23 1715 04/18/23 2026  BP: 126/62 114/61 139/62 (!) 181/120  Pulse: 67 68 70 75  Resp: 17 17 17  (!) 22  Temp:    98.3 F (36.8 C)  TempSrc:    Oral  SpO2: 98% 99% 99% 100%     There is no height or weight on file to calculate BMI.  Physical Exam   General: Laying comfortably in bed; in no acute distress.  HENT: Normal oropharynx and mucosa. Normal external appearance of ears and nose.  Neck: Supple, no pain or tenderness  CV: No JVD. No peripheral edema.  Pulmonary: Symmetric Chest rise. Normal respiratory effort.  Abdomen: Soft to touch, non-tender.  Ext: No cyanosis, edema, or deformity  Skin: No rash. Normal palpation of skin.   Musculoskeletal: Normal digits and nails by inspection. No clubbing.   Neurologic Examination  Mental status/Cognition: Alert, oriented to self, place, but not to month and year, good attention.  Speech/language: Fluent, comprehension intact, object naming intact, repetition intact.  Cranial nerves:   CN II Pupils equal and reactive to light, no VF deficits     CN III,IV,VI EOM intact, no gaze preference or deviation, no nystagmus    CN V normal sensation in V1, V2, and V3 segments bilaterally    CN VII no asymmetry, no nasolabial fold flattening    CN VIII normal hearing to speech    CN IX & X normal palatal elevation, no uvular deviation    CN XI 5/5 head turn and 5/5 shoulder shrug bilaterally    CN XII midline tongue protrusion    Motor:  Muscle bulk: poor, tone normal, pronator drift none tremor none Mvmt Root Nerve  Muscle Right Left Comments  SA C5/6 Ax Deltoid 5 5   EF C5/6 Mc Biceps 5 5   EE C6/7/8 Rad Triceps 5 5   WF C6/7 Med FCR     WE C7/8 PIN ECU     F Ab C8/T1 U ADM/FDI 5 5   HF L1/2/3 Fem Illopsoas 5 5   KE L2/3/4 Fem Quad 5 5   DF L4/5 D Peron Tib Ant 5 5   PF S1/2 Tibial Grc/Sol 5 5    Sensation:  Light touch Decreased in BL feet.   Pin prick    Temperature    Vibration   Proprioception    Coordination/Complex Motor:  - Finger to Nose intact BL - Heel to shin intact BL -  Rapid alternating movement are slowed. - Gait: deferred.  Labs   CBC:  Recent Labs  Lab 04/18/23 1626 04/18/23 1638  WBC 6.4  --   NEUTROABS 3.6  --   HGB 10.9* 10.5*  HCT 33.9* 31.0*  MCV 89.0  --   PLT 194  --     Basic Metabolic Panel:  Lab Results  Component Value Date   NA 141 04/18/2023   K 3.6 04/18/2023   CO2 26 04/18/2023   GLUCOSE 177 (H) 04/18/2023   BUN 29 (H) 04/18/2023   CREATININE 2.10 (H) 04/18/2023   CALCIUM 9.0 04/18/2023   GFRNONAA 23 (L) 04/18/2023   GFRAA 33 (L) 01/19/2021   Lipid Panel:  Lab Results  Component Value Date   LDLCALC 96 10/11/2017   HgbA1c:  Lab Results  Component Value Date   HGBA1C 7.1 (H) 07/15/2022   Urine Drug Screen:     Component Value Date/Time   LABOPIA NONE DETECTED 10/10/2017 1844   COCAINSCRNUR NONE DETECTED 10/10/2017 1844   LABBENZ NONE DETECTED 10/10/2017 1844   AMPHETMU NONE DETECTED 10/10/2017 1844   THCU NONE DETECTED 10/10/2017 1844   LABBARB NONE  DETECTED 10/10/2017 1844    Alcohol Level     Component Value Date/Time   ETH <10 09/12/2022 1847    CT Head without contrast(Personally reviewed): 1. New hypodensity within the right caudate nucleus, consistent with age indeterminate ischemic change. If further evaluation is desired, MRI could be considered. 2. Stable chronic small-vessel ischemic changes elsewhere throughout the periventricular and subcortical white matter. 3. No evidence of acute hemorrhage.  MR Angio head without contrast and Carotid Duplex BL(Personally reviewed): pending  MRI Brain(Personally reviewed): 1. Acute infarct of the right caudate head. Scattered small foci of acute ischemia within the right MCA territory white matter. No acute hemorrhage or mass effect. 2. 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  rEEG:  pending  Impression   Diana Harper is a 87 y.o. female with PMH significant for CKD, DM2, prior stroke, HTN, HLD, who presents with 2 episodes of passing out with convulsions. Convulsions lasted about 2 mins followed by 5 mins of confusion and lethargy and patient back to herself about 30 mins later. Found to have R caudate and scattered R MCA stroke. Does raise questions about potentially these convulsions being seizures vs convulsive syncope. Would have expected to have a much longer post ictal period specially if the seizure lasted about 2 mins each. Daughter reports that she was only confused for 5 mins and was slow to respond for 30 mins and then back to her baseline after that.  With the noted imaging findings will get routine EEG but hold off on AEDs. If the EEG shows epileptogenic discharges or she has more episodes, would consider starting an AED.  Recommendations  - Frequent Neuro checks per stroke unit protocol - Recommend Vascular imaging with MRA Angio Head without contrast and US Carotid doppler - Recommend obtaining  TTE - Recommend obtaining Lipid panel with LDL - Please start statin if LDL > 70 - Recommend HbA1c to evaluate for diabetes and how well it is controlled. - Antithrombotic - Aspirin 81mg  daily along with plavix 75mg  daily x 21 days, followed by Aspirin 81mg  daily alone. - Recommend DVT ppx - SBP goal - permissive hypertension first 24 h < 220/110. Held home meds.  - Recommend Telemetry monitoring for arrythmia - Recommend bedside swallow screen prior to PO intake. -  Stroke education booklet - Recommend PT/OT/SLP consult - routine EEG.  ______________________________________________________________________   Thank you for the opportunity to take part in the care of this patient. If you have any further questions, please contact the neurology consultation attending.  Signed,  Erick Blinks [facility] Pager Number [phone] _ _ _   _ __   _ __ _ _  __ __   _ __   __ _

## 2023-04-18 NOTE — ED Provider Notes (Signed)
[place] EMERGENCY DEPARTMENT AT [facility] Provider Note   CSN: [id number] Arrival date & time: 04/18/23  1546     History  Chief Complaint  Patient presents with   Loss of Consciousness    Diana Harper is a 87 y.o. female.  87 year old female with prior medical history as detailed below presents for evaluation.  Patient was with her daughter at home.  Patient had just had a bowel movement and was being cleaned.  Daughter noticed that the patient became suddenly unresponsive.  Patient did have associated tonic-clonic movements of both upper and lower extremities with her sudden syncopal episode.  Per daughter, patient was unresponsive for approximately 2 minutes.  She then became oriented.  Upon arrival to the ED the patient is acting her normal self.  She appears to be at her baseline.  Patient with history of vasovagal syncope episodes sometimes related to bowel movements.  The history is provided by the patient, medical records, a relative and a caregiver.       Home Medications Prior to Admission medications   Medication Sig Start Date End Date Taking? Authorizing Provider  acetaminophen (TYLENOL) 500 MG tablet Take 1,000 mg by mouth every 8 (eight) hours as needed for mild pain, headache or fever.    [provider]  amLODipine (NORVASC) 5 MG tablet Take 1 tablet (5 mg total) by mouth daily. Patient not taking: Reported on 10/18/2022 09/16/22   Marinda Elk, MD  BAYER LOW DOSE 81 MG tablet Take 81 mg by mouth daily.    [provider]  buPROPion (WELLBUTRIN XL) 150 MG 24 hr tablet Take 150 mg by mouth daily. Patient not taking: Reported on 10/18/2022 04/05/22   [provider]  Calcium Carbonate (CALTRATE 600 PO) Take 600 mg by mouth daily.    [provider]  carvedilol (COREG) 3.125 MG tablet Take 3.125 mg by mouth 2 (two) times daily with a meal.    [provider]  Cholecalciferol (VITAMIN D3) 25 MCG  (1000 UT) CAPS Take 1,000 Units by mouth daily.    [provider]  cloNIDine (CATAPRES - DOSED IN MG/24 HR) 0.1 mg/24hr patch Place 1 patch (0.1 mg total) onto the skin once a week. Patient not taking: Reported on 10/18/2022 09/21/22   Marinda Elk, MD  Continuous Blood Gluc Receiver (FREESTYLE LIBRE 2 READER) DEVI See admin instructions. 10/13/22   [provider]  Continuous Blood Gluc Sensor (FREESTYLE LIBRE 14 DAY SENSOR) MISC Inject 1 Device into the skin every 14 (fourteen) days.    [provider]  Cyanocobalamin (VITAMIN B-12) 1000 MCG SUBL Place 1 tablet under the tongue daily. Patient not taking: Reported on 10/18/2022    [provider]  cyanocobalamin 1000 MCG tablet Take 0.5 tablets (500 mcg total) by mouth daily. Patient not taking: Reported on 10/18/2022 09/16/22   Marinda Elk, MD  folic acid (FOLVITE) 1 MG tablet Take 1 tablet (1 mg total) by mouth daily. 09/16/22   Marinda Elk, MD  Glucagon HCl 1 MG SOLR Inject 1 mg into the skin as needed (for a blood sugar <67).    [provider]  insulin degludec (TRESIBA) 200 UNIT/ML FlexTouch Pen Inject 30 Units into the skin See admin instructions. Inject 30 units into the skin daily before brunch    [provider]  levothyroxine (SYNTHROID) 88 MCG tablet Take 88 mcg by mouth daily before breakfast.    [provider]  nitroGLYCERIN (NITROSTAT)  0.4 MG SL tablet Place 1 tablet (0.4 mg total) under the tongue every 5 (five) minutes as needed for chest pain. 01/20/21 09/12/22  Cantwell, Celeste C, PA-C  NOVOLOG FLEXPEN 100 UNIT/ML FlexPen Inject 5-10 Units into the skin See admin instructions. Inject 5 units into the skin before meals if BGL is 100-150, 7 units if BGL is 151-200, and 10 units if BGL is greater than 200 05/21/22   [provider]  sertraline (ZOLOFT) 50 MG tablet Take 150 mg by mouth at bedtime.    [provider]  thiamine  (VITAMIN B1) 100 MG tablet Take 1 tablet (100 mg total) by mouth daily. 09/15/22   Marinda Elk, MD  TRELEGY ELLIPTA 200-62.5-25 MCG/ACT AEPB Inhale 1 puff into the lungs daily as needed (for flares).    [provider]  vancomycin (VANCOCIN) 125 MG capsule Take by mouth. 10/12/22   [provider]  vancomycin (VANCOCIN) 125 MG capsule Take 1 capsule (125 mg) by mouth every 3 days for 6 weeks: 11/01/22   Arnaldo Natal, NP      Allergies    Latex, Onion, Other, Flexeril [cyclobenzaprine], Lisinopril, Metformin and related, Simvastatin, Tetracycline, and Zithromax [azithromycin]    Review of Systems   Review of Systems  Cardiovascular:  Positive for syncope.  All other systems reviewed and are negative.   Physical Exam Updated Vital Signs BP (!) 181/120 (BP Location: Right Arm)   Pulse 75   Temp 98.3 F (36.8 C) (Oral)   Resp (!) 22   SpO2 100%  Physical Exam Vitals and nursing note reviewed.  Constitutional:      General: She is not in acute distress.    Appearance: Normal appearance. She is well-developed.  HENT:     Head: Normocephalic and atraumatic.  Eyes:     Conjunctiva/sclera: Conjunctivae normal.     Pupils: Pupils are equal, round, and reactive to light.  Cardiovascular:     Rate and Rhythm: Normal rate and regular rhythm.     Heart sounds: Normal heart sounds.  Pulmonary:     Effort: Pulmonary effort is normal. No respiratory distress.     Breath sounds: Normal breath sounds.  Abdominal:     General: There is no distension.     Palpations: Abdomen is soft.     Tenderness: There is no abdominal tenderness.  Musculoskeletal:        General: No deformity. Normal range of motion.     Cervical back: Normal range of motion and neck supple.  Skin:    General: Skin is warm and dry.  Neurological:     General: No focal deficit present.     Mental Status: She is alert and oriented to person, place, and time. Mental status is at  baseline.     Cranial Nerves: No cranial nerve deficit.     Sensory: No sensory deficit.     Motor: No weakness.     ED Results / Procedures / Treatments   Labs (all labs ordered are listed, but only abnormal results are displayed) Labs Reviewed  CBC WITH DIFFERENTIAL/PLATELET - Abnormal; Notable for the following components:      Result Value   RBC 3.81 (*)    Hemoglobin 10.9 (*)    HCT 33.9 (*)    All other components within normal limits  COMPREHENSIVE METABOLIC PANEL - Abnormal; Notable for the following components:   Glucose, Bld 192 (*)    BUN 24 (*)    Creatinine,  Ser 2.05 (*)    Total Protein 6.3 (*)    Albumin 3.1 (*)    GFR, Estimated 23 (*)    All other components within normal limits  I-STAT CHEM 8, ED - Abnormal; Notable for the following components:   BUN 29 (*)    Creatinine, Ser 2.10 (*)    Glucose, Bld 177 (*)    Hemoglobin 10.5 (*)    HCT 31.0 (*)    All other components within normal limits  BRAIN NATRIURETIC PEPTIDE  URINALYSIS, W/ REFLEX TO CULTURE (INFECTION SUSPECTED)  TROPONIN I (HIGH SENSITIVITY)  TROPONIN I (HIGH SENSITIVITY)    EKG EKG Interpretation  Date/Time:  Monday Apr 18 2023 16:03:33 EDT Ventricular Rate:  64 PR Interval:  159 QRS Duration: 86 QT Interval:  408 QTC Calculation: 421 R Axis:   5 Text Interpretation: Sinus rhythm Borderline T wave abnormalities Confirmed by Kristine Royal [phone]) on 04/18/2023 4:21:56 PM  Radiology MR BRAIN WO CONTRAST  Result Date: 04/18/2023 CLINICAL DATA:  Altered mental status EXAM: MRI HEAD WITHOUT CONTRAST TECHNIQUE: Multiplanar, multiecho pulse sequences of the brain and surrounding structures were obtained without intravenous contrast. COMPARISON:  09/13/2022 FINDINGS: Brain: Acute infarct of the right caudate head. Scattered small foci of acute ischemia within the right MCA territory white matter. No acute or chronic hemorrhage. There is confluent hyperintense T2-weighted signal within the  white matter. Generalized volume loss. 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 The midline structures are normal. Vascular: Major flow voids are preserved. Skull and upper cervical spine: Normal calvarium and skull base. Visualized upper cervical spine and soft tissues are normal. Sinuses/Orbits:No paranasal sinus fluid levels or advanced mucosal thickening. No mastoid or middle ear effusion. Normal orbits. IMPRESSION: 1. Acute infarct of the right caudate head. Scattered small foci of acute ischemia within the right MCA territory white matter. No acute hemorrhage or mass effect. 2. 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 Electronically Signed   By: Deatra Robinson M.D.   On: 04/18/2023 20:02   CT Head Wo Contrast  Result Date: 04/18/2023 CLINICAL DATA:  Syncope EXAM: CT HEAD WITHOUT CONTRAST TECHNIQUE: Contiguous axial images were obtained from the base of the skull through the vertex without intravenous contrast. RADIATION DOSE REDUCTION: This exam was performed according to the departmental dose-optimization program which includes automated exposure control, adjustment of the mA and/or kV according to patient size and/or use of iterative reconstruction technique. COMPARISON:  09/12/2022 FINDINGS: Brain: Stable confluent hypodensities are seen throughout the periventricular white matter, consistent with chronic small vessel ischemic changes. There is a new hypodensity within the right caudate nucleus, consistent with age indeterminate infarct. No evidence of hemorrhage. Lateral ventricles and remaining midline structures are unremarkable. No acute extra-axial fluid collections. No mass effect. Vascular: No hyperdense vessel or unexpected calcification. Skull: Normal. Negative for fracture or focal lesion. Sinuses/Orbits: No acute finding. Other: None. IMPRESSION: 1. New hypodensity within the right caudate nucleus, consistent with age indeterminate  ischemic change. If further evaluation is desired, MRI could be considered. 2. Stable chronic small-vessel ischemic changes elsewhere throughout the periventricular and subcortical white matter. 3. No evidence of acute hemorrhage. Electronically Signed   By: Sharlet Salina M.D.   On: 04/18/2023 18:52    Procedures Procedures    Medications Ordered in ED Medications  sodium chloride 0.9 % bolus 1,000 mL (0 mLs Intravenous Stopped 04/18/23 2000)    ED Course/ Medical Decision Making/ A&P  Medical Decision Making Amount and/or Complexity of Data Reviewed Labs: ordered. Radiology: ordered.  Risk Decision regarding hospitalization.    Medical Screen Complete  This patient presented to the ED with complaint of syncope.  This complaint involves an extensive number of treatment options. The initial differential diagnosis includes, but is not limited to, syncope, seizure, metabolic abnormality, CNS event such as stroke, etc.  This presentation is: Acute, Self-Limited, Previously Undiagnosed, Uncertain Prognosis, Complicated, Systemic Symptoms, and Threat to Life/Bodily Function  Patient presents after apparent brief syncopal episode associated with bowel movement.  Patient did have tonic-clonic movements of both upper and lower extremities associated with her brief syncopal event.  On arrival to the ED, patient is at baseline.  She is without focal neurologic abnormality.  CT imaging obtained of the head.  Subtle findings concerning for possible acute infarct.  MRI brain suggest acute CVA.  Neurology is aware of case and will consult.  Patient would benefit from admission.  Hospitalist service made aware of case and will evaluate for admission.  Additional history obtained:  Additional history obtained from [facility] and Caregiver External records from outside sources obtained and reviewed including prior ED visits and prior Inpatient records.    Lab  Tests:  I ordered and personally interpreted labs.    Imaging Studies ordered:  I ordered imaging studies including CT head, MRI brain, chest x-ray I independently visualized and interpreted obtained imaging which showed acute CVA I agree with the radiologist interpretation.   Cardiac Monitoring:  The patient was maintained on a cardiac monitor.  I personally viewed and interpreted the cardiac monitor which showed an underlying rhythm of: NSR   Consultations Obtained:  I consulted neurology,  and discussed lab and imaging findings as well as pertinent plan of care.    Problem List / ED Course:  Syncope, acute CVA   Reevaluation:  After the interventions noted above, I reevaluated the patient and found that they have: stayed the same   Disposition:  After consideration of the diagnostic results and the patients response to treatment, I feel that the patent would benefit from admission.    CRITICAL CARE Performed by: Wynetta Fines   Total critical care time: 30 minutes  Critical care time was exclusive of separately billable procedures and treating other patients.  Critical care was necessary to treat or prevent imminent or life-threatening deterioration.  Critical care was time spent personally by me on the following activities: development of treatment plan with patient and/or surrogate as well as nursing, discussions with consultants, evaluation of patient's response to treatment, examination of patient, obtaining history from patient or surrogate, ordering and performing treatments and interventions, ordering and review of laboratory studies, ordering and review of radiographic studies, pulse oximetry and re-evaluation of patient's condition.         Final Clinical Impression(s) / ED Diagnoses Final diagnoses:  Syncope, unspecified syncope type  Acute CVA (cerebrovascular accident) [facility])    Rx / DC Orders ED Discharge Orders     None          Wynetta Fines, MD 04/18/23 2337

## 2023-04-18 NOTE — ED Triage Notes (Addendum)
From home. Had a bowel movement, had a syncopal episode. Has a hx of vagal response after having bowel movements. Patient then woke up, had another bowel movement and another syncopal episode. After episodes patient becomes lethargic and confused for about 30 minutes and then goes back to normal. Family reports she is normally alert and oriented per EMS.

## 2023-04-19 ENCOUNTER — Inpatient Hospital Stay (HOSPITAL_COMMUNITY): Payer: Medicare Other

## 2023-04-19 ENCOUNTER — Inpatient Hospital Stay (HOSPITAL_BASED_OUTPATIENT_CLINIC_OR_DEPARTMENT_OTHER): Payer: Medicare Other

## 2023-04-19 ENCOUNTER — Other Ambulatory Visit (HOSPITAL_COMMUNITY): Payer: Self-pay

## 2023-04-19 ENCOUNTER — Other Ambulatory Visit: Payer: Self-pay | Admitting: Cardiology

## 2023-04-19 DIAGNOSIS — I669 Occlusion and stenosis of unspecified cerebral artery: Secondary | ICD-10-CM

## 2023-04-19 DIAGNOSIS — I480 Paroxysmal atrial fibrillation: Secondary | ICD-10-CM

## 2023-04-19 DIAGNOSIS — I6523 Occlusion and stenosis of bilateral carotid arteries: Secondary | ICD-10-CM | POA: Diagnosis not present

## 2023-04-19 DIAGNOSIS — I63311 Cerebral infarction due to thrombosis of right middle cerebral artery: Secondary | ICD-10-CM | POA: Diagnosis not present

## 2023-04-19 DIAGNOSIS — R55 Syncope and collapse: Secondary | ICD-10-CM | POA: Diagnosis not present

## 2023-04-19 DIAGNOSIS — R569 Unspecified convulsions: Secondary | ICD-10-CM

## 2023-04-19 DIAGNOSIS — I639 Cerebral infarction, unspecified: Secondary | ICD-10-CM

## 2023-04-19 DIAGNOSIS — E785 Hyperlipidemia, unspecified: Secondary | ICD-10-CM

## 2023-04-19 DIAGNOSIS — I6389 Other cerebral infarction: Secondary | ICD-10-CM

## 2023-04-19 DIAGNOSIS — I63131 Cerebral infarction due to embolism of right carotid artery: Secondary | ICD-10-CM

## 2023-04-19 LAB — CBC
HCT: 30.7 % — ABNORMAL LOW (ref 36.0–46.0)
Hemoglobin: 10.3 g/dL — ABNORMAL LOW (ref 12.0–15.0)
MCH: 28.7 pg (ref 26.0–34.0)
MCHC: 33.6 g/dL (ref 30.0–36.0)
MCV: 85.5 fL (ref 80.0–100.0)
Platelets: UNDETERMINED 10*3/uL (ref 150–400)
RBC: 3.59 MIL/uL — ABNORMAL LOW (ref 3.87–5.11)
RDW: 13.5 % (ref 11.5–15.5)
WBC: 6.6 10*3/uL (ref 4.0–10.5)
nRBC: 0 % (ref 0.0–0.2)

## 2023-04-19 LAB — ECHOCARDIOGRAM COMPLETE
AR max vel: 1.44 cm2
AV Area VTI: 2.02 cm2
AV Area mean vel: 1.69 cm2
AV Mean grad: 4 mmHg
AV Peak grad: 10.5 mmHg
Ao pk vel: 1.62 m/s
Area-P 1/2: 5.13 cm2
S' Lateral: 2.9 cm
Single Plane A4C EF: 73.5 %

## 2023-04-19 LAB — BASIC METABOLIC PANEL
Anion gap: 8 (ref 5–15)
BUN: 19 mg/dL (ref 8–23)
CO2: 21 mmol/L — ABNORMAL LOW (ref 22–32)
Calcium: 8.2 mg/dL — ABNORMAL LOW (ref 8.9–10.3)
Chloride: 111 mmol/L (ref 98–111)
Creatinine, Ser: 1.42 mg/dL — ABNORMAL HIGH (ref 0.44–1.00)
GFR, Estimated: 36 mL/min — ABNORMAL LOW (ref >60)
Glucose, Bld: 125 mg/dL — ABNORMAL HIGH (ref 70–99)
Potassium: 4.1 mmol/L (ref 3.5–5.1)
Sodium: 140 mmol/L (ref 135–145)

## 2023-04-19 LAB — CBG MONITORING, ED
Glucose-Capillary: 100 mg/dL — ABNORMAL HIGH (ref 70–99)
Glucose-Capillary: 108 mg/dL — ABNORMAL HIGH (ref 70–99)
Glucose-Capillary: 134 mg/dL — ABNORMAL HIGH (ref 70–99)
Glucose-Capillary: 161 mg/dL — ABNORMAL HIGH (ref 70–99)

## 2023-04-19 LAB — GLUCOSE, CAPILLARY
Glucose-Capillary: 163 mg/dL — ABNORMAL HIGH (ref 70–99)
Glucose-Capillary: 238 mg/dL — ABNORMAL HIGH (ref 70–99)
Glucose-Capillary: 205 mg/dL — ABNORMAL HIGH (ref 70–99)

## 2023-04-19 LAB — PHOSPHORUS: Phosphorus: 2.8 mg/dL (ref 2.5–4.6)

## 2023-04-19 LAB — HEMOGLOBIN A1C
Hgb A1c MFr Bld: 7.7 % — ABNORMAL HIGH (ref 4.8–5.6)
Mean Plasma Glucose: 174.29 mg/dL

## 2023-04-19 LAB — LIPID PANEL
Cholesterol: 237 mg/dL — ABNORMAL HIGH (ref 0–200)
HDL: 37 mg/dL — ABNORMAL LOW (ref >40)
LDL Cholesterol: 163 mg/dL — ABNORMAL HIGH (ref 0–99)
Total CHOL/HDL Ratio: 6.4 RATIO
Triglycerides: 183 mg/dL — ABNORMAL HIGH (ref <150)
VLDL: 37 mg/dL (ref 0–40)

## 2023-04-19 LAB — MAGNESIUM: Magnesium: 1.5 mg/dL — ABNORMAL LOW (ref 1.7–2.4)

## 2023-04-19 MED ORDER — LIDOCAINE 5 % EX PTCH
1.0000 | MEDICATED_PATCH | Freq: Every day | CUTANEOUS | Status: AC
  Administered 2023-04-19 – 2023-04-20 (×2): 1 via TRANSDERMAL
  Filled 2023-04-19 (×2): qty 1

## 2023-04-19 MED ORDER — ENOXAPARIN SODIUM 30 MG/0.3ML IJ SOSY
30.0000 mg | PREFILLED_SYRINGE | Freq: Every day | INTRAMUSCULAR | Status: DC
  Administered 2023-04-19 – 2023-04-21 (×3): 30 mg via SUBCUTANEOUS
  Filled 2023-04-19 (×3): qty 0.3

## 2023-04-19 MED ORDER — PERFLUTREN LIPID MICROSPHERE
1.0000 mL | INTRAVENOUS | Status: AC | PRN
  Administered 2023-04-19: 2 mL via INTRAVENOUS

## 2023-04-19 MED ORDER — INSULIN ASPART 100 UNIT/ML IJ SOLN
0.0000 [IU] | INTRAMUSCULAR | Status: DC
  Administered 2023-04-19: 1 [IU] via SUBCUTANEOUS
  Administered 2023-04-19 (×2): 3 [IU] via SUBCUTANEOUS
  Administered 2023-04-19 – 2023-04-21 (×4): 2 [IU] via SUBCUTANEOUS

## 2023-04-19 MED ORDER — LACTATED RINGERS IV SOLN: INTRAVENOUS | Status: AC

## 2023-04-19 MED ORDER — ROSUVASTATIN CALCIUM 20 MG PO TABS: 20.0000 mg | ORAL_TABLET | Freq: Every day | ORAL | Status: DC

## 2023-04-19 MED ORDER — LACTATED RINGERS IV SOLN: INTRAVENOUS | Status: DC

## 2023-04-19 MED ORDER — LEVOTHYROXINE SODIUM 88 MCG PO TABS
88.0000 ug | ORAL_TABLET | Freq: Every day | ORAL | Status: DC
  Administered 2023-04-19 – 2023-04-21 (×3): 88 ug via ORAL
  Filled 2023-04-19 (×3): qty 1

## 2023-04-19 MED ORDER — MAGNESIUM SULFATE 2 GM/50ML IV SOLN: 2.0000 g | Freq: Once | INTRAVENOUS | Status: DC

## 2023-04-19 MED ORDER — CLOPIDOGREL BISULFATE 75 MG PO TABS
75.0000 mg | ORAL_TABLET | Freq: Every day | ORAL | 0 refills | Status: DC
  Filled 2023-04-19: qty 30, 30d supply, fill #0

## 2023-04-19 MED ORDER — MAGNESIUM SULFATE 2 GM/50ML IV SOLN
2.0000 g | Freq: Once | INTRAVENOUS | Status: AC
  Administered 2023-04-19: 2 g via INTRAVENOUS
  Filled 2023-04-19: qty 50

## 2023-04-19 MED ORDER — ASPIRIN 81 MG PO TBEC
81.0000 mg | DELAYED_RELEASE_TABLET | Freq: Every day | ORAL | 0 refills | Status: AC
  Filled 2023-04-19: qty 21, 21d supply, fill #0

## 2023-04-19 MED ORDER — LABETALOL HCL 5 MG/ML IV SOLN: 5.0000 mg | INTRAVENOUS | Status: DC | PRN

## 2023-04-19 MED ORDER — INSULIN DEGLUDEC 200 UNIT/ML ~~LOC~~ SOPN: 10.0000 [IU] | PEN_INJECTOR | SUBCUTANEOUS | Status: DC

## 2023-04-19 NOTE — ED Notes (Signed)
Pericare performed, brief and new linen placed

## 2023-04-19 NOTE — Progress Notes (Signed)
PT Cancellation Note  Patient Details Name: Diana Harper MRN: [id number] DOB: 24-Sep-1936   Cancelled Treatment:    Reason Eval/Treat Not Completed: Other (comment)  Received report requesting PT that pt wanting to go home and waiting on therapy; however, when arrived to ED nursing reports pt room being cleaned for transfer to inpatient.  Spoke with pt and will f/u once in room.  Anise Salvo, PT Acute Rehab [facility] [phone]  Rayetta Humphrey 04/19/2023, 12:54 PM

## 2023-04-19 NOTE — Progress Notes (Signed)
ICD-10-CM   1. Paroxysmal atrial fibrillation (HCC)  I48.0 LONG TERM MONITOR (3-14 DAYS)    2. Cerebrovascular accident (CVA) due to embolism of right carotid artery (HCC)  I63.131     3. Cerebral embolism with transient ischemic attack (TIA)  I66.9 LONG TERM MONITOR (3-14 DAYS)    4. Acute CVA (cerebrovascular accident) (HCC)  I63.9 LONG TERM MONITOR (3-14 DAYS)     Orders Placed This Encounter  Procedures   LONG TERM MONITOR (3-14 DAYS)    Standing Status:   Future    Standing Expiration Date:   04/18/2024    Order Specific Question:   Where should this test be performed?    Answer:   [facility]    Order Specific Question:   Does the patient have an implanted cardiac device?    Answer:   No    Order Specific Question:   Prescribed days of wear    Answer:   56    Order Specific Question:   Type of enrollment    Answer:   Clinic Enrollment    Order Specific Question:   Release to patient    Answer:   Immediate    No orders of the defined types were placed in this encounter.

## 2023-04-19 NOTE — ED Notes (Signed)
ED TO INPATIENT HANDOFF REPORT  ED Nurse Name and Phone #:   S Name/Age/Gender Diana Harper 87 y.o. female Room/Bed: 043C/043C  Code Status   Code Status: Full Code  Home/SNF/Other Home Patient oriented to: self, place, time, and situation Is this baseline? Yes   Triage Complete: Triage complete  Chief Complaint Acute CVA (cerebrovascular accident) [facility]) [I63.9]  Triage Note From home. Had a bowel movement, had a syncopal episode. Has a hx of vagal response after having bowel movements. Patient then woke up, had another bowel movement and another syncopal episode. After episodes patient becomes lethargic and confused for about 30 minutes and then goes back to normal. Family reports she is normally alert and oriented per EMS.   Allergies Allergies  Allergen Reactions   Latex Anaphylaxis, Shortness Of Breath and Other (See Comments)    Severe respiratory distress   Onion Diarrhea   Other Other (See Comments)    Patient cannot have TREE NUTS and anything with seeds- History of  diverticulitis    Flexeril [Cyclobenzaprine] Other (See Comments)    Caused agitation   Lisinopril Cough   Metformin And Related Diarrhea   Simvastatin Hives and Rash   Tetracycline Rash   Zithromax [Azithromycin] Rash    Level of Care/Admitting Diagnosis ED Disposition     ED Disposition  Admit   Condition  --   Comment  Hospital Area: [facility] [[id number]]  Level of Care: Telemetry Medical [104]  May admit patient to [facility] or [facility] if equivalent level of care is available:: No  Covid Evaluation: Asymptomatic - no recent exposure (last 10 days) testing not required  Diagnosis: Acute CVA (cerebrovascular accident) [facility]) [[id number]]  Admitting Physician: Darlin Drop [[id number]]  Attending Physician: Darlin Drop [[id number]]  Certification:: I certify this patient will need inpatient services for at least 2 midnights  Estimated Length of Stay: 2           B Medical/Surgery History Past Medical History:  Diagnosis Date   Anemia    Anxiety    Arthritis    Asthma    Biliary colic    C. difficile colitis 08/2022   Cholelithiasis    CKD (chronic kidney disease), stage III [facility])    nephrologist--  dr Reynolds Bowl   Colon polyps    Diverticulosis of colon    Gastric ulcer    Glaucoma    History of asthma    1980's  no longer problem since 1980's   History of diverticulitis of colon    recurrent--  2014;  2013;  2012   Hyperlipidemia    Hypertension    Hypothyroidism    Insulin dependent type 2 diabetes mellitus (HCC)    followed by dr Jonny Ruiz lambeth ([facility])    Murmur    Peripheral neuropathy    Stroke [facility])    Vitamin D deficiency    Wears hearing aid    bilateral   Wears partial dentures    lower partial and upper full   Past Surgical History:  Procedure Laterality Date   ABDOMINAL HYSTERECTOMY  1970's   CARDIOVASCULAR STRESS TEST  07/30/2009   normal exercise lexiscan nuclear study w/ no ischemia/  normal LV funciton and wall motion, ef 77%   CATARACT EXTRACTION W/ INTRAOCULAR LENS  IMPLANT, BILATERAL  2014   LAPAROSCOPIC CHOLECYSTECTOMY SINGLE SITE WITH INTRAOPERATIVE CHOLANGIOGRAM N/A 08/25/2016   Procedure: LAPAROSCOPIC CHOLECYSTECTOMY WITH INTRAOPERATIVE CHOLANGIOGRAM;  Surgeon: Rodman Pickle, MD;  Location: [facility];  Service: General;  Laterality: N/A;   LUMBAR SPINE SURGERY  2014   REMOVAL AXILLA CYST Right 1990's     A IV Location/Drains/Wounds Patient Lines/Drains/Airways Status     Active Line/Drains/Airways     Name Placement date Placement time Site Days   Peripheral IV 04/18/23 18 G Left Antecubital 04/18/23  1635  Antecubital  1            Intake/Output Last 24 hours No intake or output data in the 24 hours ending 04/19/23 1216  Labs/Imaging Results for orders placed or performed during the hospital encounter of 04/18/23 (from the past 48 hour(s))  Hemoglobin A1c      Status: Abnormal   Collection Time: 04/18/23  4:25 PM  Result Value Ref Range   Hgb A1c MFr Bld 7.8 (H) 4.8 - 5.6 %    Comment: (NOTE) Pre diabetes:          5.7%-6.4%  Diabetes:              >6.4%  Glycemic control for   <7.0% adults with diabetes    Mean Plasma Glucose 177.16 mg/dL    Comment: Performed at [facility], [address] [place], [place] [address]  CBC with Differential     Status: Abnormal   Collection Time: 04/18/23  4:26 PM  Result Value Ref Range   WBC 6.4 4.0 - 10.5 K/uL   RBC 3.81 (L) 3.87 - 5.11 MIL/uL   Hemoglobin 10.9 (L) 12.0 - 15.0 g/dL   HCT 60.4 (L) 54.0 - 98.1 %   MCV 89.0 80.0 - 100.0 fL   MCH 28.6 26.0 - 34.0 pg   MCHC 32.2 30.0 - 36.0 g/dL   RDW 19.1 47.8 - 29.5 %   Platelets 194 150 - 400 K/uL    Comment: REPEATED TO VERIFY   nRBC 0.0 0.0 - 0.2 %   Neutrophils Relative % 55 %   Neutro Abs 3.6 1.7 - 7.7 K/uL   Lymphocytes Relative 34 %   Lymphs Abs 2.2 0.7 - 4.0 K/uL   Monocytes Relative 7 %   Monocytes Absolute 0.5 0.1 - 1.0 K/uL   Eosinophils Relative 2 %   Eosinophils Absolute 0.1 0.0 - 0.5 K/uL   Basophils Relative 1 %   Basophils Absolute 0.0 0.0 - 0.1 K/uL   Immature Granulocytes 1 %   Abs Immature Granulocytes 0.03 0.00 - 0.07 K/uL    Comment: Performed at [facility], [address], [place], [place] [address]  Comprehensive metabolic panel     Status: Abnormal   Collection Time: 04/18/23  4:26 PM  Result Value Ref Range   Sodium 139 135 - 145 mmol/L   Potassium 3.7 3.5 - 5.1 mmol/L   Chloride 104 98 - 111 mmol/L   CO2 26 22 - 32 mmol/L   Glucose, Bld 192 (H) 70 - 99 mg/dL    Comment: Glucose reference range applies only to samples taken after fasting for at least 8 hours.   BUN 24 (H) 8 - 23 mg/dL   Creatinine, Ser 8.65 (H) 0.44 - 1.00 mg/dL   Calcium 9.0 8.9 - 78.4 mg/dL   Total Protein 6.3 (L) 6.5 - 8.1 g/dL   Albumin 3.1 (L) 3.5 - 5.0 g/dL   AST 25 15 - 41 U/L   ALT 19 0 - 44 U/L   Alkaline Phosphatase  53 38 - 126 U/L   Total Bilirubin 0.4 0.3 - 1.2  mg/dL   GFR, Estimated 23 (L) >60 mL/min    Comment: (NOTE) Calculated using the CKD-EPI Creatinine Equation (2021)    Anion gap 9 5 - 15    Comment: Performed at [facility], [address], [place], [place] [address]  Troponin I (High Sensitivity)     Status: None   Collection Time: 04/18/23  4:26 PM  Result Value Ref Range   Troponin I (High Sensitivity) 14 <18 ng/L    Comment: (NOTE) Elevated high sensitivity troponin I (hsTnI) values and significant  changes across serial measurements may suggest ACS but many other  chronic and acute conditions are known to elevate hsTnI results.  Refer to the "Links" section for chest pain algorithms and additional  guidance. Performed at [facility], [address] [place], [place] [address]   Brain natriuretic peptide     Status: None   Collection Time: 04/18/23  4:26 PM  Result Value Ref Range   B Natriuretic Peptide 18.0 0.0 - 100.0 pg/mL    Comment: Performed at [facility], [address] [place], [place] [address]  I-stat chem 8, ED     Status: Abnormal   Collection Time: 04/18/23  4:38 PM  Result Value Ref Range   Sodium 141 135 - 145 mmol/L   Potassium 3.6 3.5 - 5.1 mmol/L   Chloride 105 98 - 111 mmol/L   BUN 29 (H) 8 - 23 mg/dL   Creatinine, Ser 9.14 (H) 0.44 - 1.00 mg/dL   Glucose, Bld 782 (H) 70 - 99 mg/dL    Comment: Glucose reference range applies only to samples taken after fasting for at least 8 hours.   Calcium, Ion 1.27 1.15 - 1.40 mmol/L   TCO2 29 22 - 32 mmol/L   Hemoglobin 10.5 (L) 12.0 - 15.0 g/dL   HCT 95.6 (L) 21.3 - 08.6 %  Troponin I (High Sensitivity)     Status: None   Collection Time: 04/18/23  6:25 PM  Result Value Ref Range   Troponin I (High Sensitivity) 14 <18 ng/L    Comment: (NOTE) Elevated high sensitivity troponin I (hsTnI) values and significant  changes across serial measurements may suggest ACS but many other  chronic and  acute conditions are known to elevate hsTnI results.  Refer to the "Links" section for chest pain algorithms and additional  guidance. Performed at [facility], [address] [place], [place] [address]   Lipid panel     Status: Abnormal   Collection Time: 04/18/23 10:21 PM  Result Value Ref Range   Cholesterol 215 (H) 0 - 200 mg/dL   Triglycerides 962 (H) <150 mg/dL   HDL 31 (L) >95 mg/dL   Total CHOL/HDL Ratio 6.9 RATIO   VLDL 48 (H) 0 - 40 mg/dL   LDL Cholesterol 284 (H) 0 - 99 mg/dL    Comment:        Total Cholesterol/HDL:CHD Risk Coronary Heart Disease Risk Table                     Men   Women  1/2 Average Risk   3.4   3.3  Average Risk       5.0   4.4  2 X Average Risk   9.6   7.1  3 X Average Risk  23.4   11.0        Use the calculated Patient Ratio above and the CHD Risk Table to determine  the patient's CHD Risk.        ATP III CLASSIFICATION (LDL):  <100     mg/dL   Optimal  914-782  mg/dL   Near or Above                    Optimal  130-159  mg/dL   Borderline  956-213  mg/dL   High  >086     mg/dL   Very High Performed at [facility], [address] [place], [place] [address]   CBG monitoring, ED     Status: Abnormal   Collection Time: 04/19/23 12:55 AM  Result Value Ref Range   Glucose-Capillary 134 (H) 70 - 99 mg/dL    Comment: Glucose reference range applies only to samples taken after fasting for at least 8 hours.  Hemoglobin A1c     Status: Abnormal   Collection Time: 04/19/23  1:33 AM  Result Value Ref Range   Hgb A1c MFr Bld 7.7 (H) 4.8 - 5.6 %    Comment: (NOTE) Pre diabetes:          5.7%-6.4%  Diabetes:              >6.4%  Glycemic control for   <7.0% adults with diabetes    Mean Plasma Glucose 174.29 mg/dL    Comment: Performed at [facility], [address] [place], [place] [address]  CBC     Status: Abnormal   Collection Time: 04/19/23  1:33 AM  Result Value Ref Range   WBC 6.6 4.0 - 10.5 K/uL   RBC 3.59 (L)  3.87 - 5.11 MIL/uL   Hemoglobin 10.3 (L) 12.0 - 15.0 g/dL   HCT 28.4 (L) 13.2 - 44.0 %   MCV 85.5 80.0 - 100.0 fL   MCH 28.7 26.0 - 34.0 pg   MCHC 33.6 30.0 - 36.0 g/dL   RDW 10.2 72.5 - 36.6 %   Platelets PLATELET CLUMPS NOTED ON SMEAR, UNABLE TO ESTIMATE 150 - 400 K/uL   nRBC 0.0 0.0 - 0.2 %    Comment: Performed at [facility], [address], [place], [place] [address]  Basic metabolic panel     Status: Abnormal   Collection Time: 04/19/23  1:33 AM  Result Value Ref Range   Sodium 140 135 - 145 mmol/L   Potassium 4.1 3.5 - 5.1 mmol/L    Comment: HEMOLYSIS AT THIS LEVEL MAY AFFECT RESULT   Chloride 111 98 - 111 mmol/L   CO2 21 (L) 22 - 32 mmol/L   Glucose, Bld 125 (H) 70 - 99 mg/dL    Comment: Glucose reference range applies only to samples taken after fasting for at least 8 hours.   BUN 19 8 - 23 mg/dL   Creatinine, Ser 7.42 (H) 0.44 - 1.00 mg/dL   Calcium 8.2 (L) 8.9 - 10.3 mg/dL   GFR, Estimated 36 (L) >60 mL/min    Comment: (NOTE) Calculated using the CKD-EPI Creatinine Equation (2021)    Anion gap 8 5 - 15    Comment: Performed at [facility], [address] [place], [place] [address]  Magnesium     Status: Abnormal   Collection Time: 04/19/23  1:33 AM  Result Value Ref Range   Magnesium 1.5 (L) 1.7 - 2.4 mg/dL    Comment: HEMOLYSIS AT THIS LEVEL MAY AFFECT RESULT Performed at [facility], [address] [place], [place] [address]   Phosphorus  Status: None   Collection Time: 04/19/23  1:33 AM  Result Value Ref Range   Phosphorus 2.8 2.5 - 4.6 mg/dL    Comment: HEMOLYSIS AT THIS LEVEL MAY AFFECT RESULT Performed at [facility], [address] [place], [place] [address]   Lipid panel     Status: Abnormal   Collection Time: 04/19/23  1:33 AM  Result Value Ref Range   Cholesterol 237 (H) 0 - 200 mg/dL   Triglycerides 604 (H) <150 mg/dL   HDL 37 (L) >54 mg/dL   Total CHOL/HDL Ratio 6.4 RATIO   VLDL 37 0 - 40 mg/dL   LDL  Cholesterol 098 (H) 0 - 99 mg/dL    Comment:        Total Cholesterol/HDL:CHD Risk Coronary Heart Disease Risk Table                     Men   Women  1/2 Average Risk   3.4   3.3  Average Risk       5.0   4.4  2 X Average Risk   9.6   7.1  3 X Average Risk  23.4   11.0        Use the calculated Patient Ratio above and the CHD Risk Table to determine the patient's CHD Risk.        ATP III CLASSIFICATION (LDL):  <100     mg/dL   Optimal  119-147  mg/dL   Near or Above                    Optimal  130-159  mg/dL   Borderline  829-562  mg/dL   High  >130     mg/dL   Very High Performed at [facility], [address], [place], [place] [address]   CBG monitoring, ED     Status: Abnormal   Collection Time: 04/19/23  3:23 AM  Result Value Ref Range   Glucose-Capillary 108 (H) 70 - 99 mg/dL    Comment: Glucose reference range applies only to samples taken after fasting for at least 8 hours.  CBG monitoring, ED     Status: Abnormal   Collection Time: 04/19/23  8:10 AM  Result Value Ref Range   Glucose-Capillary 100 (H) 70 - 99 mg/dL    Comment: Glucose reference range applies only to samples taken after fasting for at least 8 hours.  CBG monitoring, ED     Status: Abnormal   Collection Time: 04/19/23 12:04 PM  Result Value Ref Range   Glucose-Capillary 161 (H) 70 - 99 mg/dL    Comment: Glucose reference range applies only to samples taken after fasting for at least 8 hours.   ECHOCARDIOGRAM COMPLETE  Result Date: 04/19/2023    ECHOCARDIOGRAM REPORT   Patient Name:   Diana Harper Date of Exam: 04/19/2023 Medical Rec #:  [id number]      Height:       65.0 in Accession #:    [id number]     Weight:       138.4 lb Date of Birth:  07/09/1936     BSA:          1.692 m Patient Age:    86 years       BP:           213/94 mmHg Patient Gender: F              HR:  72 bpm. Exam Location:  Inpatient Procedure: 2D Echo, Cardiac Doppler, Color Doppler and Intracardiac             Opacification Agent Indications:   Stroke  History:       Patient has no prior history of Echocardiogram examinations.                Stroke.  Sonographer:   Lucy Antigua Referring      [phone] [facility] Phys: IMPRESSIONS  1. Left ventricular ejection fraction, by estimation, is 60 to 65%. The left ventricle has normal function. The left ventricle has no regional wall motion abnormalities. Left ventricular diastolic parameters are consistent with Grade I diastolic dysfunction (impaired relaxation). Elevated left atrial pressure.  2. Right ventricular systolic function is normal. The right ventricular size is normal.  3. The mitral valve is normal in structure. No evidence of mitral valve regurgitation. No evidence of mitral stenosis.  4. The aortic valve is tricuspid. There is moderate calcification of the aortic valve. Aortic valve regurgitation is not visualized. No aortic stenosis is present.  5. The inferior vena cava is normal in size with greater than 50% respiratory variability, suggesting right atrial pressure of 3 mmHg. Comparison(s): No significant change from prior study. Prior images reviewed side by side. FINDINGS  Left Ventricle: Left ventricular ejection fraction, by estimation, is 60 to 65%. The left ventricle has normal function. The left ventricle has no regional wall motion abnormalities. Definity contrast agent was given IV to delineate the left ventricular  endocardial borders. The left ventricular internal cavity size was normal in size. There is no left ventricular hypertrophy. Left ventricular diastolic parameters are consistent with Grade I diastolic dysfunction (impaired relaxation). Elevated left atrial pressure. Right Ventricle: The right ventricular size is normal. No increase in right ventricular wall thickness. Right ventricular systolic function is normal. Left Atrium: Left atrial size was normal in size. Right Atrium: Right atrial size was normal in size. Pericardium: There  is no evidence of pericardial effusion. Mitral Valve: The mitral valve is normal in structure. No evidence of mitral valve regurgitation. No evidence of mitral valve stenosis. Tricuspid Valve: The tricuspid valve is normal in structure. Tricuspid valve regurgitation is not demonstrated. No evidence of tricuspid stenosis. Aortic Valve: Severely calcified, rigid noncoronarty cusp with prreserved mobility of the right and left coronary cusps. The aortic valve is tricuspid. There is moderate calcification of the aortic valve. Aortic valve regurgitation is not visualized. No aortic stenosis is present. Aortic valve mean gradient measures 4.0 mmHg. Aortic valve peak gradient measures 10.5 mmHg. Aortic valve area, by VTI measures 2.02 cm. Pulmonic Valve: The pulmonic valve was normal in structure. Pulmonic valve regurgitation is not visualized. No evidence of pulmonic stenosis. Aorta: The aortic root is normal in size and structure. Venous: The inferior vena cava is normal in size with greater than 50% respiratory variability, suggesting right atrial pressure of 3 mmHg. IAS/Shunts: No atrial level shunt detected by color flow Doppler.  LEFT VENTRICLE PLAX 2D LVIDd:         4.20 cm     Diastology LVIDs:         2.90 cm     LV e' medial:    3.37 cm/s LV PW:         1.00 cm     LV E/e' medial:  17.7 LV IVS:        1.00 cm     LV e' lateral:   3.48 cm/s LVOT  diam:     1.90 cm     LV E/e' lateral: 17.1 LV SV:         57 LV SV Index:   34 LVOT Area:     2.84 cm  LV Volumes (MOD) LV vol d, MOD A4C: 66.5 ml LV vol s, MOD A4C: 17.6 ml LV SV MOD A4C:     66.5 ml RIGHT VENTRICLE RV S prime:     14.30 cm/s TAPSE (M-mode): 2.4 cm LEFT ATRIUM           Index        RIGHT ATRIUM           Index LA Vol (A4C): 33.5 ml 19.80 ml/m  RA Area:     11.90 cm                                    RA Volume:   23.10 ml  13.65 ml/m  AORTIC VALVE AV Area (Vmax):    1.44 cm AV Area (Vmean):   1.69 cm AV Area (VTI):     2.02 cm AV Vmax:            162.00 cm/s AV Vmean:          90.300 cm/s AV VTI:            0.283 m AV Peak Grad:      10.5 mmHg AV Mean Grad:      4.0 mmHg LVOT Vmax:         82.50 cm/s LVOT Vmean:        53.800 cm/s LVOT VTI:          0.202 m LVOT/AV VTI ratio: 0.71  AORTA Ao Root diam: 2.90 cm Ao Asc diam:  3.20 cm MITRAL VALVE MV Area (PHT): 5.13 cm    SHUNTS MV Decel Time: 148 msec    Systemic VTI:  0.20 m MV E velocity: 59.50 cm/s  Systemic Diam: 1.90 cm MV A velocity: 94.00 cm/s MV E/A ratio:  0.63 Mihai Croitoru MD Electronically signed by Thurmon Fair MD Signature Date/Time: 04/19/2023/9:46:47 AM    Final    EEG adult  Result Date: 04/19/2023 Charlsie Quest, MD     04/19/2023  8:58 AM Patient Name: Diana Harper MRN: [id number] Epilepsy Attending: Charlsie Quest Referring Physician/Provider: Erick Blinks, MD Date: 04/19/2023 Duration: 25.23 mins Patient history:  87 y.o. female with PMH significant for CKD, DM2, prior stroke, HTN, HLD, who presents with 2 episodes of passing out with convulsions. EEG to evaluate for seizure. Level of alertness: Awake, asleep AEDs during EEG study: None Technical aspects: This EEG study was done with scalp electrodes positioned according to the 10-20 International system of electrode placement. Electrical activity was reviewed with band pass filter of 1-70Hz , sensitivity of 7 uV/mm, display speed of 33mm/sec with a 60Hz  notched filter applied as appropriate. EEG data were recorded continuously and digitally stored.  Video monitoring was available and reviewed as appropriate. Description: The posterior dominant rhythm consists of 7 Hz activity of moderate voltage (25-35 uV) seen predominantly in posterior head regions, symmetric and reactive to eye opening and eye closing. Sleep was characterized by vertex waves, sleep spindles (12 to 14 Hz), maximal frontocentral region. EEG showed continuous generalized predominantly 6-7Hz  theta slowing admixed with 2-3Hz  delta slowing. Hyperventilation  and photic stimulation were not performed.   ABNORMALITY - Continuous slow, generalized -  Background slow IMPRESSION: This study is suggestive of moderate diffuse encephalopathy, nonspecific etiology. No seizures or epileptiform discharges were seen throughout the recording. Priyanka O Yadav   VAS US CAROTID  Result Date: 04/19/2023 Carotid Arterial Duplex Study Patient Name:  Diana Harper  Date of Exam:   04/19/2023 Medical Rec #: [id number]       Accession #:    [id number] Date of Birth: 20-May-1936      Patient Gender: F Patient Age:   35 years Exam Location:  [facility] Procedure:      VAS US CAROTID Referring Phys: Terrilee Files [facility] --------------------------------------------------------------------------------  Indications:       CVA. Risk Factors:      Hypertension, hyperlipidemia, Diabetes, prior CVA. Comparison Study:  02-03-2021 Prior carotid duplex Performing Technologist: Jean Rosenthal RDMS, RVT  Examination Guidelines: A complete evaluation includes B-mode imaging, spectral Doppler, color Doppler, and power Doppler as needed of all accessible portions of each vessel. Bilateral testing is considered an integral part of a complete examination. Limited examinations for reoccurring indications may be performed as noted.  Right Carotid Findings: +----------+--------+--------+--------+------------------+------------------+           PSV cm/sEDV cm/sStenosisPlaque DescriptionComments           +----------+--------+--------+--------+------------------+------------------+ CCA Prox  86      14                                                   +----------+--------+--------+--------+------------------+------------------+ CCA Distal67      11                                intimal thickening +----------+--------+--------+--------+------------------+------------------+ ICA Prox  70      15                                intimal thickening  +----------+--------+--------+--------+------------------+------------------+ ICA Distal84      24                                                   +----------+--------+--------+--------+------------------+------------------+ ECA       46                                                           +----------+--------+--------+--------+------------------+------------------+ +----------+--------+-------+----------------+-------------------+           PSV cm/sEDV cmsDescribe        Arm Pressure (mmHG) +----------+--------+-------+----------------+-------------------+ [id number]             Multiphasic, WNL                    +----------+--------+-------+----------------+-------------------+ +---------+--------+--+--------+--+---------+ VertebralPSV cm/s58EDV cm/s11Antegrade +---------+--------+--+--------+--+---------+  Left Carotid Findings: +----------+--------+--------+--------+------------------+------------------+           PSV cm/sEDV cm/sStenosisPlaque DescriptionComments           +----------+--------+--------+--------+------------------+------------------+ CCA Prox  82      12                                                   +----------+--------+--------+--------+------------------+------------------+  CCA Distal58      9                                 intimal thickening +----------+--------+--------+--------+------------------+------------------+ ICA Prox  63      12                                intimal thickening +----------+--------+--------+--------+------------------+------------------+ ICA Distal149     26                                tortuous           +----------+--------+--------+--------+------------------+------------------+ ECA       48      5                                                    +----------+--------+--------+--------+------------------+------------------+  +----------+--------+--------+----------------+-------------------+           PSV cm/sEDV cm/sDescribe        Arm Pressure (mmHG) +----------+--------+--------+----------------+-------------------+ [id number]              Multiphasic, WNL                    +----------+--------+--------+----------------+-------------------+ +---------+--------+--+--------+--+---------+ VertebralPSV cm/s53EDV cm/s12Antegrade +---------+--------+--+--------+--+---------+   Summary: Right Carotid: The extracranial vessels were near-normal with only minimal wall                thickening or plaque. Left Carotid: The extracranial vessels were near-normal with only minimal wall               thickening or plaque. Vertebrals:  Bilateral vertebral arteries demonstrate antegrade flow. Subclavians: Normal flow hemodynamics were seen in bilateral subclavian              arteries. *See table(s) above for measurements and observations.     Preliminary    MR ANGIO HEAD WO CONTRAST  Result Date: 04/18/2023 CLINICAL DATA:  Acute neurologic deficit EXAM: MRA HEAD WITHOUT CONTRAST TECHNIQUE: Angiographic images of the Circle of Willis were acquired using MRA technique without intravenous contrast. COMPARISON:  None Available. FINDINGS: POSTERIOR CIRCULATION: --Vertebral arteries: Normal --Inferior cerebellar arteries: Normal. --Basilar artery: Normal. --Superior cerebellar arteries: Severe stenosis of the midportion of the left superior cerebellar artery. Normal right. --Posterior cerebral arteries: Normal. ANTERIOR CIRCULATION: --Intracranial internal carotid arteries: Normal. --Anterior cerebral arteries (ACA): Normal. --Middle cerebral arteries (MCA): Normal. Anatomic variants: None Other: None. 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 Otherwise normal intracranial MRA. Electronically Signed   By: Deatra Robinson M.D.   On: 04/18/2023 21:54   MR BRAIN WO CONTRAST  Result Date:  04/18/2023 CLINICAL DATA:  Altered mental status EXAM: MRI HEAD WITHOUT CONTRAST TECHNIQUE: Multiplanar, multiecho pulse sequences of the brain and surrounding structures were obtained without intravenous contrast. COMPARISON:  09/13/2022 FINDINGS: Brain: Acute infarct of the right caudate head. Scattered small foci of acute ischemia within the right MCA territory white matter. No acute or chronic hemorrhage. There is confluent hyperintense T2-weighted signal within the white matter. Generalized volume loss. 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 The midline structures are normal. Vascular: Major flow voids are preserved. Skull  and upper cervical spine: Normal calvarium and skull base. Visualized upper cervical spine and soft tissues are normal. Sinuses/Orbits:No paranasal sinus fluid levels or advanced mucosal thickening. No mastoid or middle ear effusion. Normal orbits. IMPRESSION: 1. Acute infarct of the right caudate head. Scattered small foci of acute ischemia within the right MCA territory white matter. No acute hemorrhage or mass effect. 2. 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 Electronically Signed   By: Deatra Robinson M.D.   On: 04/18/2023 20:02   CT Head Wo Contrast  Result Date: 04/18/2023 CLINICAL DATA:  Syncope EXAM: CT HEAD WITHOUT CONTRAST TECHNIQUE: Contiguous axial images were obtained from the base of the skull through the vertex without intravenous contrast. RADIATION DOSE REDUCTION: This exam was performed according to the departmental dose-optimization program which includes automated exposure control, adjustment of the mA and/or kV according to patient size and/or use of iterative reconstruction technique. COMPARISON:  09/12/2022 FINDINGS: Brain: Stable confluent hypodensities are seen throughout the periventricular white matter, consistent with chronic small vessel ischemic changes. There is a new hypodensity within the right  caudate nucleus, consistent with age indeterminate infarct. No evidence of hemorrhage. Lateral ventricles and remaining midline structures are unremarkable. No acute extra-axial fluid collections. No mass effect. Vascular: No hyperdense vessel or unexpected calcification. Skull: Normal. Negative for fracture or focal lesion. Sinuses/Orbits: No acute finding. Other: None. IMPRESSION: 1. New hypodensity within the right caudate nucleus, consistent with age indeterminate ischemic change. If further evaluation is desired, MRI could be considered. 2. Stable chronic small-vessel ischemic changes elsewhere throughout the periventricular and subcortical white matter. 3. No evidence of acute hemorrhage. Electronically Signed   By: Sharlet Salina M.D.   On: 04/18/2023 18:52    Pending Labs Unresulted Labs (From admission, onward)     Start     Ordered   04/26/23 0500  Creatinine, serum  (enoxaparin (LOVENOX)    CrCl < 30 ml/min)  Once,   R       Comments: while on enoxaparin therapy.    04/19/23 0037   04/18/23 1608  Urinalysis, w/ Reflex to Culture (Infection Suspected) -Urine, Clean Catch  Once,   URGENT       Question:  Specimen Source  Answer:  Urine, Clean Catch   04/18/23 1607            Vitals/Pain Today's Vitals   04/19/23 0920 04/19/23 0938 04/19/23 1000 04/19/23 1200  BP: (!) 168/81  (!) 174/91 (!) 172/88  Pulse: 65  78 73  Resp: 15  19 18   Temp:      TempSrc:      SpO2: 100%  100% 100%  PainSc:  0-No pain      Isolation Precautions No active isolations  Medications Medications  aspirin EC tablet 81 mg (81 mg Oral Given 04/19/23 0919)  clopidogrel (PLAVIX) tablet 75 mg (75 mg Oral Given 04/19/23 0919)  labetalol (NORMODYNE) injection 5 mg (has no administration in time range)  insulin aspart (novoLOG) injection 0-9 Units (2 Units Subcutaneous Given 04/19/23 1206)  levothyroxine (SYNTHROID) tablet 88 mcg (88 mcg Oral Given 04/19/23 0808)  enoxaparin (LOVENOX) injection 30 mg (30  mg Subcutaneous Given 04/19/23 0919)  lidocaine (LIDODERM) 5 % 1 patch (1 patch Transdermal Patch Applied 04/19/23 0440)  perflutren lipid microspheres (DEFINITY) IV suspension (2 mLs Intravenous Given 04/19/23 0907)  lactated ringers infusion ( Intravenous New Bag/Given 04/19/23 1042)  sodium chloride 0.9 % bolus 1,000 mL (0  mLs Intravenous Stopped 04/18/23 2000)  magnesium sulfate IVPB 2 g 50 mL (0 g Intravenous Stopped 04/19/23 0658)    Mobility non-ambulatory     Focused Assessments Neuro Assessment Handoff:  Swallow screen pass?    NIH Stroke Scale  Dizziness Present: No Headache Present: No Interval: Shift assessment Level of Consciousness (1a.)   : Alert, keenly responsive LOC Questions (1b. )   : Answers both questions correctly LOC Commands (1c. )   : Performs both tasks correctly Best Gaze (2. )  : Normal Visual (3. )  : No visual loss Facial Palsy (4. )    : Normal symmetrical movements Motor Arm, Left (5a. )   : No drift Motor Arm, Right (5b. ) : No drift Motor Leg, Left (6a. )  : No drift Motor Leg, Right (6b. ) : No drift Limb Ataxia (7. ): Absent Sensory (8. )  : Normal, no sensory loss Best Language (9. )  : No aphasia Dysarthria (10. ): Normal Extinction/Inattention (11.)   : No Abnormality Complete NIHSS TOTAL: 0     Neuro Assessment: Within Defined Limits Neuro Checks:   Shift assessment (04/19/23 0333)  Has TPA been given? No If patient is a Neuro Trauma and patient is going to OR before floor call report to 4N Charge nurse: [phone] or [phone]   R Recommendations: See Admitting Provider Note  Report given to:   Additional Notes:

## 2023-04-19 NOTE — Evaluation (Signed)
Occupational Therapy Evaluation Patient Details Name: Diana Harper MRN: [id number] DOB: [birth year] Today's Date: 04/19/2023   History of Present Illness Pt is 87 yo female admitted on 04/18/23 after being found convulsing on [facility], then another episode when EMS arrived.  Pt found to have R caudate acute infarct and scattered small R MCA stroke.  Pt with hx including but not limited to  type 2 diabetes, diabetic polyneuropathy, hypothyroidism, CKD 3B, history of C. difficile infection, asthma   Clinical Impression   PTA pt lives with her daughters and has 24/7 assistance as needed. At baseline, family assist with ADL and mobility. Pt has had multiple falls and requires encouragement to use her RW. Pt demonstrates a decline in functional status due to below listed deficits and will benefit from [facility] - daughter present and agreeable. Acute OT to follow.  VSS during session.      Recommendations for follow up therapy are one component of a multi-disciplinary discharge planning process, led by the attending physician.  Recommendations may be updated based on patient status, additional functional criteria and insurance authorization.   Assistance Recommended at Discharge Frequent or constant Supervision/Assistance  Patient can return home with the following A little help with walking and/or transfers;A lot of help with bathing/dressing/bathroom;Assistance with cooking/housework;Direct supervision/assist for medications management;Direct supervision/assist for financial management;Assist for transportation;Help with stairs or ramp for entrance (direct physical assistance with ADL and mobility)    Functional Status Assessment  Patient has had a recent decline in their functional status and demonstrates the ability to make significant improvements in function in a reasonable and predictable amount of time.  Equipment Recommendations  None recommended by OT    Recommendations for Other Services        Precautions / Restrictions Precautions Precautions: Fall      Mobility Bed Mobility Overal bed mobility: Needs Assistance Bed Mobility: Supine to Sit, Sit to Supine     Supine to sit: Min assist Sit to supine: Min assist   General bed mobility comments: Increased time with min A for trunk to sit and for legs back to bed; pt prefers to have some assist - asking for daughter to assist her    Transfers Overall transfer level: Needs assistance Equipment used: Rolling walker (2 wheels) Transfers: Sit to/from Stand Sit to Stand: Min assist                  Balance Overall balance assessment: Needs assistance, History of Falls Sitting-balance support: No upper extremity supported, Bilateral upper extremity supported Sitting balance-Leahy Scale: Fair Sitting balance - Comments: preferred UE support; at times leans posteriorly with challenges needing UE support   Standing balance support: Bilateral upper extremity supported Standing balance-Leahy Scale: Poor Standing balance comment: RW and min A at times                           ADL either performed or assessed with clinical judgement   ADL Overall ADL's : Needs assistance/impaired Eating/Feeding: Set up   Grooming: Minimal assistance   Upper Body Bathing: Minimal assistance   Lower Body Bathing: Moderate assistance   Upper Body Dressing : Minimal assistance   Lower Body Dressing: Moderate assistance   Toilet Transfer: Minimal assistance;Ambulation   Toileting- Clothing Manipulation and Hygiene: Moderate assistance Toileting - Clothing Manipulation Details (indicate cue type and reason): urgency; often incontinenet     Functional mobility during ADLs: Minimal assistance;Cueing for safety;+2 for safety/equipment;Rolling walker (2 wheels) (  increased assistance when backing up; pt states falls happen when backing up)       Vision Patient Visual Report: No change from baseline Additional Comments:  Able to read signs; conjugate gazewill further assess     Perception     Praxis      Pertinent Vitals/Pain Pain Assessment Pain Assessment: No/denies pain Faces Pain Scale: No hurt     Hand Dominance Right   Extremity/Trunk Assessment Upper Extremity Assessment Upper Extremity Assessment: Generalized weakness; limited B shoulder ROM previously but overall functional   Lower Extremity Assessment Lower Extremity Assessment: Defer to PT evaluation RLE Deficits / Details: ROM WFL; MMT grossly 5/5 LLE Deficits / Details: ROM WFL; MMT grossly 5/5   Cervical / Trunk Assessment Cervical / Trunk Assessment: Kyphotic   Communication Communication Communication: Expressive difficulties   Cognition Arousal/Alertness: Awake/alert Behavior During Therapy: WFL for tasks assessed/performed Overall Cognitive Status: History of cognitive impairments - at baseline Processing is slow;more difficulty as distractions increase, increasing risk of falls                                       General Comments  VSS    Exercises     Shoulder Instructions      Home Living Family/patient expects to be discharged to:: Private residence Living Arrangements: Children Available Help at Discharge: Family;Available 24 hours/day (lives with daughter; other daughters and nieces assist as needed) Type of Home: House Home Access: Stairs to enter Secretary/administrator of Steps: 4-5 Entrance Stairs-Rails: Right Home Layout: One level     Bathroom Shower/Tub: Chief Strategy Officer: Standard Bathroom Accessibility: Yes How Accessible: Accessible via walker (sideways) Home Equipment: Rolling Walker (2 wheels);BSC/3in1;Grab bars - toilet;Shower seat;Cane - single point;Tub bench;Wheelchair - manual;Other (comment)   Additional Comments: call bell in her room and bed rails      Prior Functioning/Environment Prior Level of Function : Needs assist              Mobility Comments: Walks with RW in home; Always has at least supervision with ambulation and generally requires some assist with transfers.  Reports a few falls in the last year.  States 13 total but daughter reports over 4 years. ADLs Comments: Assist for shower transfers, bathing, dressing. Able to  transfer to [facility] at night without assist.        OT Problem List: Decreased strength;Decreased activity tolerance;Impaired balance (sitting and/or standing);Decreased cognition;Decreased safety awareness;Decreased knowledge of use of DME or AE      OT Treatment/Interventions: Self-care/ADL training;Therapeutic exercise;Energy conservation;DME and/or AE instruction;Therapeutic activities;Cognitive remediation/compensation;Patient/family education;Balance training    OT Goals(Current goals can be found in the care plan section) Acute Rehab OT Goals Patient Stated Goal: to get some sleep OT Goal Formulation: With patient/family Time For Goal Achievement: 05/03/23 Potential to Achieve Goals: Good  OT Frequency: Min 2X/week    Co-evaluation              AM-PAC OT "6 Clicks" Daily Activity     Outcome Measure Help from another person eating meals?: A Little Help from another person taking care of personal grooming?: A Little Help from another person toileting, which includes using toliet, bedpan, or urinal?: A Lot Help from another person bathing (including washing, rinsing, drying)?: A Lot Help from another person to put on and taking off regular upper body clothing?: A Little Help from another  person to put on and taking off regular lower body clothing?: A Lot 6 Click Score: 15   End of Session Equipment Utilized During Treatment: Gait belt;Rolling walker (2 wheels) Nurse Communication: Mobility status  Activity Tolerance: Patient tolerated treatment well Patient left: in bed;with call bell/phone within reach;with bed alarm set;with family/visitor present  OT Visit Diagnosis:  Unsteadiness on feet (R26.81);Other abnormalities of gait and mobility (R26.89);Repeated falls (R29.6);Muscle weakness (generalized) (M62.81);Other symptoms and signs involving cognitive function                Time: 4098-1191 OT Time Calculation (min): 24 min Charges:  OT General Charges $OT Visit: 1 Visit OT Evaluation $OT Eval Moderate Complexity: 1 Mod  Iveth Heidemann, OT/L   Acute OT Clinical Specialist Acute Rehabilitation Services Pager [phone] Office [phone]   [facility] 04/19/2023, 4:12 PM

## 2023-04-19 NOTE — Progress Notes (Signed)
EEG complete - results pending 

## 2023-04-19 NOTE — Evaluation (Signed)
Physical Therapy Evaluation Patient Details Name: Diana Harper MRN: [id number] DOB: 07/13/1936 Today's Date: 04/19/2023  History of Present Illness  Pt is 87 yo female admitted on 04/18/23 after being found convulsing on [facility], then another episode when EMS arrived.  Pt found to have R caudate acute infarct and scattered small R MCA stroke.  Pt with hx including but not limited to  type 2 diabetes, diabetic polyneuropathy, hypothyroidism, CKD 3B, history of C. difficile infection, asthma  Clinical Impression  Pt admitted with above diagnosis. At baseline, pt resides with daughter and has 24 hr care.  She is ambulatory with RW and at least supervision.  She generally required assist for transfers but occasionally moved to [facility] on her own.  Today, pt requiring min A bed mobility and to ambulate 42' with RW.  Pt required cues for RW proximity.  Strength is equal bilaterally.  Pt does have hx of falls and was getting HHPT/OT a few months ago but placed on hold b/c her spouse got sick.  Pt appears to be slightly below baseline and will benefit from acute PT to progress.  Family interested in HHPT to help pt continuing to progress and decrease fall risk.  Pt currently with functional limitations due to the deficits listed below (see PT Problem List). Pt will benefit from acute skilled PT to increase their independence and safety with mobility to allow discharge.          Recommendations for follow up therapy are one component of a multi-disciplinary discharge planning process, led by the attending physician.  Recommendations may be updated based on patient status, additional functional criteria and insurance authorization.  Follow Up Recommendations       Assistance Recommended at Discharge Frequent or constant Supervision/Assistance  Patient can return home with the following  A little help with walking and/or transfers;A little help with bathing/dressing/bathroom;Assistance with cooking/housework;Help  with stairs or ramp for entrance    Equipment Recommendations None recommended by PT  Recommendations for Other Services       Functional Status Assessment Patient has had a recent decline in their functional status and demonstrates the ability to make significant improvements in function in a reasonable and predictable amount of time.     Precautions / Restrictions Precautions Precautions: Fall      Mobility  Bed Mobility Overal bed mobility: Needs Assistance Bed Mobility: Supine to Sit, Sit to Supine     Supine to sit: Min assist Sit to supine: Min assist   General bed mobility comments: Increased time with min A for trunk to sit and for legs back to bed; pt prefers to have some assist - asking for daughter to assist her    Transfers Overall transfer level: Needs assistance Equipment used: Rolling walker (2 wheels) Transfers: Sit to/from Stand Sit to Stand: Min assist                Ambulation/Gait Ambulation/Gait assistance: Editor, commissioning (Feet): 70 Feet Assistive device: Rolling walker (2 wheels) Gait Pattern/deviations: Step-to pattern, Decreased stride length, Drifts right/left       General Gait Details: Drifts R and L; required assist for RW with turns; tends to push RW too far forward - multiple cues to correct  Stairs            Wheelchair Mobility    Modified Rankin (Stroke Patients Only)       Balance Overall balance assessment: Needs assistance Sitting-balance support: No upper extremity supported, Bilateral upper extremity  supported Sitting balance-Leahy Scale: Fair Sitting balance - Comments: preferred UE support; at times leans posteriorly with challenges needing UE support   Standing balance support: Bilateral upper extremity supported Standing balance-Leahy Scale: Poor Standing balance comment: RW and min A at times                             Pertinent Vitals/Pain Pain Assessment Pain Assessment:  No/denies pain    Home Living Family/patient expects to be discharged to:: Private residence Living Arrangements: Children Available Help at Discharge: Family;Available 24 hours/day (lives with daughter; other daughters and nieces assist as needed) Type of Home: House Home Access: Stairs to enter Entrance Stairs-Rails: Right Entrance Stairs-Number of Steps: 4-5   Home Layout: One level Home Equipment: Agricultural consultant (2 wheels);BSC/3in1;Grab bars - toilet;Shower seat;Cane - single point;Tub bench;Wheelchair - manual;Other (comment) Additional Comments: call bell in her room and bed rails    Prior Function Prior Level of Function : Needs assist             Mobility Comments: Walks with RW in home; Always has at least supervision with ambulation and generally requires some assist with transfers.  Reports a few falls in the last year.  States 13 total but daughter reports over 4 years. ADLs Comments: Assist for shower transfers, bathing, dressing. Able to sometimes transfer to [facility] without assist     Hand Dominance   Dominant Hand: Right    Extremity/Trunk Assessment   Upper Extremity Assessment Upper Extremity Assessment: Defer to OT evaluation    Lower Extremity Assessment Lower Extremity Assessment: LLE deficits/detail;RLE deficits/detail RLE Deficits / Details: ROM WFL; MMT grossly 5/5 LLE Deficits / Details: ROM WFL; MMT grossly 5/5    Cervical / Trunk Assessment Cervical / Trunk Assessment: Kyphotic  Communication   Communication: Expressive difficulties  Cognition Arousal/Alertness: Awake/alert Behavior During Therapy: WFL for tasks assessed/performed Overall Cognitive Status: Within Functional Limits for tasks assessed                                 General Comments: Lethargic but arousable        General Comments General comments (skin integrity, edema, etc.): BP supine 185/78, Sitting 176/99, sitting post walk 174/72.  Educated daughter on  safety , recommendations, and use of gait belt    Exercises     Assessment/Plan    PT Assessment Patient needs continued PT services  PT Problem List Decreased strength;Decreased cognition;Decreased activity tolerance;Decreased knowledge of use of DME;Decreased balance;Decreased safety awareness;Decreased mobility;Decreased knowledge of precautions       PT Treatment Interventions DME instruction;Therapeutic exercise;Gait training;Stair training;Functional mobility training;Therapeutic activities;Patient/family education;Balance training;Neuromuscular re-education;Modalities    PT Goals (Current goals can be found in the Care Plan section)  Acute Rehab PT Goals Patient Stated Goal: return home PT Goal Formulation: With patient/family Time For Goal Achievement: 05/03/23 Potential to Achieve Goals: Good    Frequency Min 4X/week     Co-evaluation               AM-PAC PT "6 Clicks" Mobility  Outcome Measure Help needed turning from your back to your side while in a flat bed without using bedrails?: A Little Help needed moving from lying on your back to sitting on the side of a flat bed without using bedrails?: A Little Help needed moving to and from a bed to a chair (including  a wheelchair)?: A Little Help needed standing up from a chair using your arms (e.g., wheelchair or bedside chair)?: A Little Help needed to walk in hospital room?: A Little Help needed climbing 3-5 steps with a railing? : A Lot 6 Click Score: 17    End of Session Equipment Utilized During Treatment: Gait belt Activity Tolerance: Patient tolerated treatment well Patient left: in bed;with call bell/phone within reach;with bed alarm set;with family/visitor present Nurse Communication: Mobility status PT Visit Diagnosis: Other abnormalities of gait and mobility (R26.89);History of falling (Z91.81)    Time: 1610-9604 PT Time Calculation (min) (ACUTE ONLY): 34 min   Charges:   PT Evaluation $PT  Eval Low Complexity: 1 Low          Dominick Zertuche, PT Acute Rehab [facility] [phone]   Rayetta Humphrey 04/19/2023, 3:35 PM

## 2023-04-19 NOTE — ED Notes (Signed)
Pt Awake, alert, and eating

## 2023-04-19 NOTE — ED Notes (Signed)
Pt returns from ECHO

## 2023-04-19 NOTE — Progress Notes (Signed)
  Echocardiogram 2D Echocardiogram has been performed.  Diana Harper Wynn Banker 04/19/2023, 9:06 AM

## 2023-04-19 NOTE — Care Management Obs Status (Signed)
MEDICARE OBSERVATION STATUS NOTIFICATION   Patient Details  Name: Diana Harper MRN: [id number] Date of Birth: December 30, 1935   Medicare Observation Status Notification Given:  Yes    Kermit Balo, RN 04/19/2023, 4:36 PM

## 2023-04-19 NOTE — ED Notes (Signed)
EEG tech at bedside. 

## 2023-04-19 NOTE — ED Notes (Signed)
Cleaned patient and changed linen, patient cooperative and thankful. Patient back to sleep.

## 2023-04-19 NOTE — Care Management CC44 (Signed)
Condition Code 44 Documentation Completed  Patient Details  Name: Skyelynn Rehman MRN: [id number] Date of Birth: 08/20/1936   Condition Code 44 given:  Yes Patient signature on Condition Code 44 notice:  Yes Documentation of 2 MD's agreement:  Yes Code 44 added to claim:  Yes    Kermit Balo, RN 04/19/2023, 4:36 PM

## 2023-04-19 NOTE — H&P (Signed)
History and Physical  Diana Harper ZOX:[id number] DOB: August 04, 1936 DOA: 04/18/2023  Referring physician: Dr. Rodena Medin, EDP  PCP: Lewis Moccasin, MD  Outpatient Specialists: GI, endocrinology, palliative care Patient coming from: Home.  Chief Complaint: Syncope  HPI: Diana Harper is a 87 y.o. female with medical history significant for type 2 diabetes, diabetic polyneuropathy, hypothyroidism, CKD 3B, history of C. difficile infection, asthma who presented to [facility] ED from home via EMS after a syncopal episode.  The patient had a bowel movement and while she was being cleaned up, she lost consciousness.  EMS was activated.  She was brought into the ED for further evaluation.  In the ED, a noncontrast CT head showed abnormal findings suggestive of small acute CVA.  An MRI brain was completed which revealed the following findings: 1. Acute infarct of the right caudate head. Scattered small foci of acute ischemia within the right MCA territory white matter. No acute hemorrhage or mass effect. 2. 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  MRA head without contrast:  Severe stenosis of the midportion of the left superior cerebellar artery. Otherwise normal intracranial MRA.  Bilateral carotid Doppler ultrasound is pending.  Seen by neurology/stroke team, recommended admission for complete stroke workup.  Neurology will continue to see in consultation.  Admitted by [facility], hospitalist service, to telemetry medical unit as inpatient status.  ED Course: Temperature 98.3.  BP 213/94, pulse 126, respiration rate 20, O2 saturation 100% on room air.  Lab studies remarkable for serum glucose 192, BUN 24, creatinine 2.05, baseline creatinine 1.78.  Albumin 3.1.  Hemoglobin 10.9.  WBC 6.4, platelet count 194.  Review of Systems: Review of systems as noted in the HPI. All other systems reviewed and are negative.   Past Medical History:   Diagnosis Date   Anemia    Anxiety    Arthritis    Asthma    Biliary colic    C. difficile colitis 08/2022   Cholelithiasis    CKD (chronic kidney disease), stage III [facility])    nephrologist--  dr Reynolds Bowl   Colon polyps    Diverticulosis of colon    Gastric ulcer    Glaucoma    History of asthma    1980's  no longer problem since 1980's   History of diverticulitis of colon    recurrent--  2014;  2013;  2012   Hyperlipidemia    Hypertension    Hypothyroidism    Insulin dependent type 2 diabetes mellitus (HCC)    followed by dr Jonny Ruiz lambeth ([facility])    Murmur    Peripheral neuropathy    Stroke [facility])    Vitamin D deficiency    Wears hearing aid    bilateral   Wears partial dentures    lower partial and upper full   Past Surgical History:  Procedure Laterality Date   ABDOMINAL HYSTERECTOMY  1970's   CARDIOVASCULAR STRESS TEST  07/30/2009   normal exercise lexiscan nuclear study w/ no ischemia/  normal LV funciton and wall motion, ef 77%   CATARACT EXTRACTION W/ INTRAOCULAR LENS  IMPLANT, BILATERAL  2014   LAPAROSCOPIC CHOLECYSTECTOMY SINGLE SITE WITH INTRAOPERATIVE CHOLANGIOGRAM N/A 08/25/2016   Procedure: LAPAROSCOPIC CHOLECYSTECTOMY WITH INTRAOPERATIVE CHOLANGIOGRAM;  Surgeon: Rodman Pickle, MD;  Location: [facility] [place];  Service: General;  Laterality: N/A;   LUMBAR SPINE SURGERY  2014   REMOVAL AXILLA CYST Right 1990's    Social History:  reports that she has  never smoked. She has never used smokeless tobacco. She reports that she does not drink alcohol and does not use drugs.   Allergies  Allergen Reactions   Latex Anaphylaxis, Shortness Of Breath and Other (See Comments)    Severe respiratory distress   Onion Diarrhea   Other Other (See Comments)    Patient cannot have TREE NUTS and anything with seeds- History of  diverticulitis    Flexeril [Cyclobenzaprine] Other (See Comments)    Caused agitation   Lisinopril Cough   Metformin And  Related Diarrhea   Simvastatin Hives and Rash   Tetracycline Rash   Zithromax [Azithromycin] Rash    Family History  Problem Relation Age of Onset   Stroke Mother    Colon polyps Mother    Diabetes Mother    Diabetes Father    Kidney failure Father    COPD Father    Colon polyps Sister    Diabetes Sister    Stroke Brother    Diabetes Brother    Heart attack Maternal Grandmother    Heart disease Maternal Grandfather    Diabetes Paternal Grandmother    Stroke Paternal Grandfather    Hypertension Paternal Grandfather    Diabetes Sister    Diabetes Sister    Diabetes Son    Diabetes Daughter    Colon cancer Neg Hx    Esophageal cancer Neg Hx    Inflammatory bowel disease Neg Hx    Liver disease Neg Hx    Pancreatic cancer Neg Hx    Rectal cancer Neg Hx    Stomach cancer Neg Hx       Prior to Admission medications   Medication Sig Start Date End Date Taking? Authorizing Provider  acetaminophen (TYLENOL) 500 MG tablet Take 1,000 mg by mouth every 8 (eight) hours as needed for mild pain, headache or fever.    [provider]  amLODipine (NORVASC) 5 MG tablet Take 1 tablet (5 mg total) by mouth daily. Patient not taking: Reported on 10/18/2022 09/16/22   Marinda Elk, MD  BAYER LOW DOSE 81 MG tablet Take 81 mg by mouth daily.    [provider]  buPROPion (WELLBUTRIN XL) 150 MG 24 hr tablet Take 150 mg by mouth daily. Patient not taking: Reported on 10/18/2022 04/05/22   [provider]  Calcium Carbonate (CALTRATE 600 PO) Take 600 mg by mouth daily.    [provider]  carvedilol (COREG) 3.125 MG tablet Take 3.125 mg by mouth 2 (two) times daily with a meal.    [provider]  Cholecalciferol (VITAMIN D3) 25 MCG (1000 UT) CAPS Take 1,000 Units by mouth daily.    [provider]  cloNIDine (CATAPRES - DOSED IN MG/24 HR) 0.1 mg/24hr patch Place 1 patch (0.1 mg total) onto the skin once a week. Patient not taking:  Reported on 10/18/2022 09/21/22   Marinda Elk, MD  Continuous Blood Gluc Receiver (FREESTYLE LIBRE 2 READER) DEVI See admin instructions. 10/13/22   [provider]  Continuous Blood Gluc Sensor (FREESTYLE LIBRE 14 DAY SENSOR) MISC Inject 1 Device into the skin every 14 (fourteen) days.    [provider]  Cyanocobalamin (VITAMIN B-12) 1000 MCG SUBL Place 1 tablet under the tongue daily. Patient not taking: Reported on 10/18/2022    [provider]  cyanocobalamin 1000 MCG tablet Take 0.5 tablets (500 mcg total) by mouth daily. Patient not taking: Reported on 10/18/2022 09/16/22   Marinda Elk, MD  folic acid (  FOLVITE) 1 MG tablet Take 1 tablet (1 mg total) by mouth daily. 09/16/22   Marinda Elk, MD  Glucagon HCl 1 MG SOLR Inject 1 mg into the skin as needed (for a blood sugar <67).    [provider]  insulin degludec (TRESIBA) 200 UNIT/ML FlexTouch Pen Inject 30 Units into the skin See admin instructions. Inject 30 units into the skin daily before brunch    [provider]  levothyroxine (SYNTHROID) 88 MCG tablet Take 88 mcg by mouth daily before breakfast.    [provider]  nitroGLYCERIN (NITROSTAT) 0.4 MG SL tablet Place 1 tablet (0.4 mg total) under the tongue every 5 (five) minutes as needed for chest pain. 01/20/21 09/12/22  Cantwell, Celeste C, PA-C  NOVOLOG FLEXPEN 100 UNIT/ML FlexPen Inject 5-10 Units into the skin See admin instructions. Inject 5 units into the skin before meals if BGL is 100-150, 7 units if BGL is 151-200, and 10 units if BGL is greater than 200 05/21/22   [provider]  sertraline (ZOLOFT) 50 MG tablet Take 150 mg by mouth at bedtime.    [provider]  thiamine (VITAMIN B1) 100 MG tablet Take 1 tablet (100 mg total) by mouth daily. 09/15/22   Marinda Elk, MD  TRELEGY ELLIPTA 200-62.5-25 MCG/ACT AEPB Inhale 1 puff into the lungs daily as needed (for flares).     [provider]  vancomycin (VANCOCIN) 125 MG capsule Take by mouth. 10/12/22   [provider]  vancomycin (VANCOCIN) 125 MG capsule Take 1 capsule (125 mg) by mouth every 3 days for 6 weeks: 11/01/22   Arnaldo Natal, NP    Physical Exam: BP (!) 213/94   Pulse (!) 126   Temp 98.3 F (36.8 C) (Oral)   Resp 20   SpO2 100%   General: 87 y.o. year-old female well developed well nourished in no acute distress.  Alert and interactive. Cardiovascular: Regular rate and rhythm with no rubs or gallops.  No thyromegaly or JVD noted.  No lower extremity edema. 2/4 pulses in all 4 extremities. Respiratory: Clear to auscultation with no wheezes or rales. Good inspiratory effort. Abdomen: Soft nontender nondistended with normal bowel sounds x4 quadrants. Muskuloskeletal: No cyanosis, clubbing or edema noted bilaterally Neuro: CN II-XII intact, strength, sensation, reflexes Skin: No ulcerative lesions noted or rashes Psychiatry: Mood is appropriate for condition and setting          Labs on Admission:  Basic Metabolic Panel: Recent Labs  Lab 04/18/23 1626 04/18/23 1638  NA 139 141  K 3.7 3.6  CL 104 105  CO2 26  --   GLUCOSE 192* 177*  BUN 24* 29*  CREATININE 2.05* 2.10*  CALCIUM 9.0  --    Liver Function Tests: Recent Labs  Lab 04/18/23 1626  AST 25  ALT 19  ALKPHOS 53  BILITOT 0.4  PROT 6.3*  ALBUMIN 3.1*   No results for input(s): "LIPASE", "AMYLASE" in the last 168 hours. No results for input(s): "AMMONIA" in the last 168 hours. CBC: Recent Labs  Lab 04/18/23 1626 04/18/23 1638  WBC 6.4  --   NEUTROABS 3.6  --   HGB 10.9* 10.5*  HCT 33.9* 31.0*  MCV 89.0  --   PLT 194  --    Cardiac Enzymes: No results for input(s): "CKTOTAL", "CKMB", "CKMBINDEX", "TROPONINI" in the last 168 hours.  BNP (last 3 results) Recent Labs    04/18/23 1626  BNP 18.0    ProBNP (  last 3 results) No results for input(s): "PROBNP" in the last 8760  hours.  CBG: No results for input(s): "GLUCAP" in the last 168 hours.  Radiological Exams on Admission: MR ANGIO HEAD WO CONTRAST  Result Date: 04/18/2023 CLINICAL DATA:  Acute neurologic deficit EXAM: MRA HEAD WITHOUT CONTRAST TECHNIQUE: Angiographic images of the Circle of Willis were acquired using MRA technique without intravenous contrast. COMPARISON:  None Available. FINDINGS: POSTERIOR CIRCULATION: --Vertebral arteries: Normal --Inferior cerebellar arteries: Normal. --Basilar artery: Normal. --Superior cerebellar arteries: Severe stenosis of the midportion of the left superior cerebellar artery. Normal right. --Posterior cerebral arteries: Normal. ANTERIOR CIRCULATION: --Intracranial internal carotid arteries: Normal. --Anterior cerebral arteries (ACA): Normal. --Middle cerebral arteries (MCA): Normal. Anatomic variants: None Other: None. 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 Otherwise normal intracranial MRA. Electronically Signed   By: Deatra Robinson M.D.   On: 04/18/2023 21:54   MR BRAIN WO CONTRAST  Result Date: 04/18/2023 CLINICAL DATA:  Altered mental status EXAM: MRI HEAD WITHOUT CONTRAST TECHNIQUE: Multiplanar, multiecho pulse sequences of the brain and surrounding structures were obtained without intravenous contrast. COMPARISON:  09/13/2022 FINDINGS: Brain: Acute infarct of the right caudate head. Scattered small foci of acute ischemia within the right MCA territory white matter. No acute or chronic hemorrhage. There is confluent hyperintense T2-weighted signal within the white matter. Generalized volume loss. 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 The midline structures are normal. Vascular: Major flow voids are preserved. Skull and upper cervical spine: Normal calvarium and skull base. Visualized upper cervical spine and soft tissues are normal. Sinuses/Orbits:No paranasal sinus fluid levels or advanced mucosal thickening. No mastoid or  middle ear effusion. Normal orbits. IMPRESSION: 1. Acute infarct of the right caudate head. Scattered small foci of acute ischemia within the right MCA territory white matter. No acute hemorrhage or mass effect. 2. 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 Electronically Signed   By: Deatra Robinson M.D.   On: 04/18/2023 20:02   CT Head Wo Contrast  Result Date: 04/18/2023 CLINICAL DATA:  Syncope EXAM: CT HEAD WITHOUT CONTRAST TECHNIQUE: Contiguous axial images were obtained from the base of the skull through the vertex without intravenous contrast. RADIATION DOSE REDUCTION: This exam was performed according to the departmental dose-optimization program which includes automated exposure control, adjustment of the mA and/or kV according to patient size and/or use of iterative reconstruction technique. COMPARISON:  09/12/2022 FINDINGS: Brain: Stable confluent hypodensities are seen throughout the periventricular white matter, consistent with chronic small vessel ischemic changes. There is a new hypodensity within the right caudate nucleus, consistent with age indeterminate infarct. No evidence of hemorrhage. Lateral ventricles and remaining midline structures are unremarkable. No acute extra-axial fluid collections. No mass effect. Vascular: No hyperdense vessel or unexpected calcification. Skull: Normal. Negative for fracture or focal lesion. Sinuses/Orbits: No acute finding. Other: None. IMPRESSION: 1. New hypodensity within the right caudate nucleus, consistent with age indeterminate ischemic change. If further evaluation is desired, MRI could be considered. 2. Stable chronic small-vessel ischemic changes elsewhere throughout the periventricular and subcortical white matter. 3. No evidence of acute hemorrhage. Electronically Signed   By: Sharlet Salina M.D.   On: 04/18/2023 18:52    EKG: I independently viewed the EKG done and my findings are as  followed: Sinus rhythm rate of 64.  Nonspecific ST-T changes.  QTc 421.  Assessment/Plan Present on Admission:  Acute CVA (cerebrovascular accident) [facility])  Principal Problem:  Acute CVA (cerebrovascular accident) (HCC)  Acute CVA involving right caudate head Scattered small foci of acute ischemia within the right MCA territory white matter.  No acute hemorrhage or mass effect. Old posterior left parietal lobe infarct and white matter changes of chronic small vessel ischemia Ongoing stroke workup MRA brain without contrast revealed severe stenosis of the midportion of the left superior cerebral artery. Bilateral carotid artery Doppler ultrasound is pending Neurochecks every 4 hours Complete 2D echo Fasting lipid panel and A1c PT/OT/speech therapist evaluation Fall and aspiration precautions. Urology consulted, appreciate assistance. DAPT aspirin 81 mg daily and Plavix 75 mg daily x 21 days then antiplatelet monotherapy aspirin 81 mg daily alone.  Possible allergy to statin. Permissive hypertension, treat SBP greater than 220 or DBP greater than 120.  Type 2 diabetes with hyperglycemia Serum glucose 192 on presentation Obtain hemoglobin A1c Start insulin sliding scale every 4 hours while NPO N.p.o. until passes swallow evaluation.  AKI on CKD 4, suspect prerenal from poor oral intake Baseline creatinine appears to be 1.4 with GFR 36 Presented with creatinine of 2.10 with GFR 23 Avoid nephrotoxic agents, dehydration and hypotension Gentle IV fluid hydration LR at 50 cc/h x 2 days.  HFpEF 60 to 65% Last 2D echo done on 09/14/2022 revealed LVEF 60 to 65% with grade 1 diastolic dysfunction. Closely monitor volume status while on gentle IV fluid hydration Strict I's and O's and daily weight  Hypothyroidism Resume home levothyroxine   DVT prophylaxis: Subcu Lovenox daily  Code Status: Full code by default since no family members at bedside.  Family Communication: None at  bedside.  Disposition Plan: Admitted to telemetry medical unit  Consults called: Neurology/stroke team  Admission status: Inpatient status.   Status is: Inpatient Patient requires at least 2 midnights for further evaluation and treatment of present condition.   Darlin Drop MD [facility] Pager [phone]  If 7PM-7AM, please contact night-coverage [email or web address] Password [facility]  04/19/2023, 12:14 AM

## 2023-04-19 NOTE — Procedures (Signed)
Patient Name: Persais Totman  MRN: [id number]  Epilepsy Attending: Charlsie Quest  Referring Physician/Provider: Erick Blinks, MD  Date: 04/19/2023 Duration: 25.23 mins  Patient history:  87 y.o. female with PMH significant for CKD, DM2, prior stroke, HTN, HLD, who presents with 2 episodes of passing out with convulsions. EEG to evaluate for seizure.  Level of alertness: Awake, asleep  AEDs during EEG study: None  Technical aspects: This EEG study was done with scalp electrodes positioned according to the 10-20 International system of electrode placement. Electrical activity was reviewed with band pass filter of 1-70Hz , sensitivity of 7 uV/mm, display speed of 48mm/sec with a 60Hz  notched filter applied as appropriate. EEG data were recorded continuously and digitally stored.  Video monitoring was available and reviewed as appropriate.  Description: The posterior dominant rhythm consists of 7 Hz activity of moderate voltage (25-35 uV) seen predominantly in posterior head regions, symmetric and reactive to eye opening and eye closing. Sleep was characterized by vertex waves, sleep spindles (12 to 14 Hz), maximal frontocentral region. EEG showed continuous generalized predominantly 6-7Hz  theta slowing admixed with 2-3Hz  delta slowing. Hyperventilation and photic stimulation were not performed.     ABNORMALITY - Continuous slow, generalized - Background slow  IMPRESSION: This study is suggestive of moderate diffuse encephalopathy, nonspecific etiology. No seizures or epileptiform discharges were seen throughout the recording.  Eldor Conaway Annabelle Harman

## 2023-04-19 NOTE — ED Notes (Signed)
Pt to vascular for ECHO

## 2023-04-19 NOTE — Evaluation (Signed)
Clinical/Bedside Swallow Evaluation Patient Details  Name: Zamiyah Shu MRN: [id number] Date of Birth: 27-Feb-1936  Today's Date: 04/19/2023 Time: SLP Start Time (ACUTE ONLY): 1100 SLP Stop Time (ACUTE ONLY): 1115 SLP Time Calculation (min) (ACUTE ONLY): 15 min  Past Medical History:  Past Medical History:  Diagnosis Date   Anemia    Anxiety    Arthritis    Asthma    Biliary colic    C. difficile colitis 08/2022   Cholelithiasis    CKD (chronic kidney disease), stage III [facility])    nephrologist--  dr Reynolds Bowl   Colon polyps    Diverticulosis of colon    Gastric ulcer    Glaucoma    History of asthma    1980's  no longer problem since 1980's   History of diverticulitis of colon    recurrent--  2014;  2013;  2012   Hyperlipidemia    Hypertension    Hypothyroidism    Insulin dependent type 2 diabetes mellitus (HCC)    followed by dr Jonny Ruiz lambeth ([facility])    Murmur    Peripheral neuropathy    Stroke [facility])    Vitamin D deficiency    Wears hearing aid    bilateral   Wears partial dentures    lower partial and upper full   Past Surgical History:  Past Surgical History:  Procedure Laterality Date   ABDOMINAL HYSTERECTOMY  1970's   CARDIOVASCULAR STRESS TEST  07/30/2009   normal exercise lexiscan nuclear study w/ no ischemia/  normal LV funciton and wall motion, ef 77%   CATARACT EXTRACTION W/ INTRAOCULAR LENS  IMPLANT, BILATERAL  2014   LAPAROSCOPIC CHOLECYSTECTOMY SINGLE SITE WITH INTRAOPERATIVE CHOLANGIOGRAM N/A 08/25/2016   Procedure: LAPAROSCOPIC CHOLECYSTECTOMY WITH INTRAOPERATIVE CHOLANGIOGRAM;  Surgeon: Rodman Pickle, MD;  Location: [facility];  Service: General;  Laterality: N/A;   LUMBAR SPINE SURGERY  2014   REMOVAL AXILLA CYST Right 1990's   HPI:  Pt is a 87 y.o. female who presented to [facility] ED from home via EMS after a syncopal episode after bowel movement. Pt with medical history significant for type 2 diabetes, diabetic polyneuropathy,  hypothyroidism, CKD 3B, history of C. difficile infection, and asthma. MRI, 5/21, "1. Acute infarct of the right caudate head. Scattered small foci of  acute ischemia within the right MCA territory white matter. No acute  hemorrhage or mass effect.  2. Old posterior left parietal lobe infarct and white matter changes  of chronic small vessel ischemia." MRA, 5/21, "Severe stenosis of the midportion of the left superior cerebellar  artery. Otherwise normal intracranial MRA."    Assessment / Plan / Recommendation  Clinical Impression  Pt seen for clinical swallowing evaluation. Pt presents with s/sx mild oral dysphagia c/b prolonged/inefficient mastication of solids likely secondary to edentulism. Pt reports owning dentures, but not present for evaluation. Pharyngeal swallow appeared [facility] per clinical assessment. Recommend diet downgrade to mech soft and thin liquids with safe swallowing strategies/aspiration precautions as outlined below. Good likelihood for diet advancement if dentures are brought in and donned by pt. Noted pt with observed impaired spatial and temporal orientation and paucity of speech/?anomia during conversational exchanges. Pt would benefit from cognitive-linguistic evaluation given stroke.  SLP Visit Diagnosis: Dysphagia, oral phase (R13.11)    Aspiration Risk  Mild aspiration risk    Diet Recommendation Dysphagia 3 (Mech soft);Thin liquid   Liquid Administration via: Spoon;Cup;Straw Medication Administration:  (as tolerate) Supervision:  (set up) Compensations: Slow rate;Small sips/bites Postural Changes: Seated  upright at 90 degrees;Remain upright for at least 30 minutes after po intake    Other  Recommendations Oral Care Recommendations: Oral care BID    Recommendations for follow up therapy are one component of a multi-disciplinary discharge planning process, led by the attending physician.  Recommendations may be updated based on patient status, additional functional  criteria and insurance authorization.  Follow up Recommendations  (TBD)         Functional Status Assessment Patient has had a recent decline in their functional status and demonstrates the ability to make significant improvements in function in a reasonable and predictable amount of time.  Frequency and Duration min 2x/week          Prognosis Prognosis for improved oropharyngeal function: Good Barriers to Reach Goals:  (dental status)      Swallow Study   General Date of Onset: 04/18/23 HPI: Pt is a 87 y.o. female who presented to [facility] ED from home via EMS after a syncopal episode after bowel movement. Pt with medical history significant for type 2 diabetes, diabetic polyneuropathy, hypothyroidism, CKD 3B, history of C. difficile infection, and asthma. MRI, 5/21, "1. Acute infarct of the right caudate head. Scattered small foci of  acute ischemia within the right MCA territory white matter. No acute  hemorrhage or mass effect.  2. Old posterior left parietal lobe infarct and white matter changes  of chronic small vessel ischemia." MRA, 5/21, "Severe stenosis of the midportion of the left superior cerebellar  artery. Otherwise normal intracranial MRA." Type of Study: Bedside Swallow Evaluation Previous Swallow Assessment: none Diet Prior to this Study: Regular;Thin liquids (Level 0) Temperature Spikes Noted: Yes (low grade) Respiratory Status: Room air History of Recent Intubation: No Behavior/Cognition: Alert;Cooperative;Pleasant mood;Confused Oral Cavity Assessment: Within Functional Limits Oral Care Completed by SLP: Yes Oral Cavity - Dentition: Dentures, not available;Edentulous Vision: Functional for self-feeding Self-Feeding Abilities: Able to feed self Patient Positioning: Upright in bed Baseline Vocal Quality: Normal Volitional Cough: Strong Volitional Swallow: Able to elicit    Oral/Motor/Sensory Function Overall Oral Motor/Sensory Function: Within functional limits    Ice Chips Ice chips: Not tested   Thin Liquid Thin Liquid: Within functional limits Presentation: Cup;Straw;Self Fed    Nectar Thick Nectar Thick Liquid: Not tested   Honey Thick Honey Thick Liquid: Not tested   Puree Puree: Within functional limits Presentation: Self Fed;Spoon   Solid     Solid: Impaired Oral Phase Impairments: Impaired mastication Oral Phase Functional Implications: Impaired mastication Pharyngeal Phase Impairments:  (appeared WFL)     Clyde Canterbury, M.S., CCC-SLP Speech-Language Pathologist Secure Chat Preferred  O: [phone]  Woodroe Chen 04/19/2023,12:28 PM

## 2023-04-19 NOTE — TOC Initial Note (Signed)
Transition of Care [facility]) - Initial/Assessment Note    Patient Details  Name: Diana Harper MRN: [id number] Date of Birth: 1936-04-02  Transition of Care [facility]) CM/SW Contact:    Kermit Balo, RN Phone Number: 04/19/2023, 4:54 PM  Clinical Narrative:                 Pt is from home with family.  DME at home: walker/ BSC/ Tub bench Pt already active with [facility] for home health services. CM has updated Christian with [facility] of resumption orders.  Pt to stay until tomorrow so she can get monitor for home arranged.  Family will transport home tomorrow.   Expected Discharge Plan: Home w Home Health Services Barriers to Discharge: Continued Medical Work up   Patient Goals and CMS Choice   CMS Medicare.gov Compare Post Acute Care list provided to:: Patient Represenative (must comment) Choice offered to / list presented to : Adult Children      Expected Discharge Plan and Services   Discharge Planning Services: CM Consult Post Acute Care Choice: Home Health Living arrangements for the past 2 months: Single Family Home Expected Discharge Date: 04/19/23                         HH Arranged: PT, OT HH Agency: [facility] Date HH Agency Contacted: 04/19/23   Representative spoke with at [facility] Agency: Ephriam Knuckles  Prior Living Arrangements/Services Living arrangements for the past 2 months: Single Family Home Lives with:: Adult Children Patient language and need for interpreter reviewed:: Yes Do you feel safe going back to the place where you live?: Yes      Need for Family Participation in Patient Care: Yes (Comment) Care giver support system in place?: Yes (comment)   Criminal Activity/Legal Involvement Pertinent to Current Situation/Hospitalization: No - Comment as needed  Activities of Daily Living      Permission Sought/Granted                  Emotional Assessment Appearance:: Appears stated age Attitude/Demeanor/Rapport: Engaged Affect (typically  observed): Accepting     Psych Involvement: No (comment)  Admission diagnosis:  Acute CVA (cerebrovascular accident) (HCC) [I63.9] Syncope, unspecified syncope type [R55] CVA (cerebral vascular accident) [facility]) [I63.9] Patient Active Problem List   Diagnosis Date Noted   Acute CVA (cerebrovascular accident) (HCC) 04/18/2023   Myelopathy (HCC) 01/09/2023   Acute metabolic encephalopathy 09/15/2022   Vitamin B12 deficiency 09/15/2022   Thiamine deficiency 09/15/2022   Back pain 09/15/2022   CKD (chronic kidney disease), stage IV (HCC) 09/15/2022   Hypokalemia 09/15/2022   AMS (altered mental status) 09/12/2022   C. difficile colitis 07/25/2022   Normocytic anemia 07/24/2022   Uncontrolled type 2 diabetes mellitus with hyperglycemia, without long-term current use of insulin (HCC) 07/17/2022   Acute diverticulitis 04/06/2021   CKD stage 3 due to type 2 diabetes mellitus (HCC) 04/06/2021   Essential hypertension 04/06/2021   Hypothyroidism    Depression    COPD (chronic obstructive pulmonary disease) (HCC)    Anorexia 02/11/2021   Bloating symptom 02/11/2021   History of colon polyps 02/09/2021   Hemorrhoids 02/09/2021   Rectal bleeding 02/09/2021   Fecal smearing 02/09/2021   AKI (acute kidney injury) (HCC) 12/13/2019   Acute kidney injury (HCC) 12/12/2019   Diarrhea 12/12/2019   Dehydration 12/12/2019   Cerebral thrombosis with cerebral infarction 10/10/2017   Cerebral embolism with cerebral infarction 10/10/2017   Subarachnoid hemorrhage 10/10/2017  Intracerebral hemorrhage 10/10/2017   CVA (cerebral vascular accident) (HCC) 10/10/2017   Chronic cholecystitis with calculus 08/25/2016   Syncope 11/15/2011   Diverticulitis 11/15/2011   PCP:  Lewis Moccasin, MD Pharmacy:   [facility] [address], [place] - [address] [place] [address] Phone: [phone] Fax: [phone]  [facility] [address] [place] [address] Phone: [phone] Fax: [phone]  [facility] [address] [place] [address] Phone: [phone] Fax: [phone]     Social Determinants of Health (SDOH) Social History: SDOH Screenings   Food Insecurity: Unknown (09/14/2022)  Housing: Low Risk  (09/14/2022)  Transportation Needs: Patient Declined (09/14/2022)  Utilities: Patient Declined (09/14/2022)  Tobacco Use: Low Risk  (04/18/2023)   SDOH Interventions:     Readmission Risk Interventions    07/27/2022   10:46 AM  Readmission Risk Prevention Plan  Transportation Screening Complete  PCP or Specialist Appt within 3-5 Days Complete  HRI or Home Care Consult Complete  Social Work Consult for Recovery Care Planning/Counseling Complete  Palliative Care Screening Not Applicable  Medication Review Oceanographer) Complete

## 2023-04-19 NOTE — Progress Notes (Addendum)
STROKE TEAM PROGRESS NOTE   INTERVAL HISTORY No family at bedside. Code stroke 5/20 after daughter found her convulsing on bedside commode with her head leaning back, passed out for another 2 minutes and then lethargic and tired afterwards.  Patient then had another syncopal episode while moving with EMS.  MRI brain shows right caudate infarct along with scattered small right MCA stroke Patient/daughter did endorse frequent passing out when using the restroom or getting up quickly.  Daughter did also report poor p.o. fluid intake at home. As patient was previously on aspirin at home, will recommend aspirin and Plavix for 3 weeks then Plavix. Recommending long-term cardiac monitoring, Dr. Jacinto Halim office to arrange. Pending PT/OT recommendations.  Vitals:   04/19/23 0700 04/19/23 0855 04/19/23 0920 04/19/23 1000  BP: (!) 189/114  (!) 168/81 (!) 174/91  Pulse: 67  65 78  Resp: 16  15 19   Temp:  98.7 F (37.1 C)    TempSrc:  Oral    SpO2: 100%  100% 100%   CBC:  Recent Labs  Lab 04/18/23 1626 04/18/23 1638 04/19/23 0133  WBC 6.4  --  6.6  NEUTROABS 3.6  --   --   HGB 10.9* 10.5* 10.3*  HCT 33.9* 31.0* 30.7*  MCV 89.0  --  85.5  PLT 194  --  PLATELET CLUMPS NOTED ON SMEAR, UNABLE TO ESTIMATE   Basic Metabolic Panel:  Recent Labs  Lab 04/18/23 1626 04/18/23 1638 04/19/23 0133  NA 139 141 140  K 3.7 3.6 4.1  CL 104 105 111  CO2 26  --  21*  GLUCOSE 192* 177* 125*  BUN 24* 29* 19  CREATININE 2.05* 2.10* 1.42*  CALCIUM 9.0  --  8.2*  MG  --   --  1.5*  PHOS  --   --  2.8   Lipid Panel:  Recent Labs  Lab 04/19/23 0133  CHOL 237*  TRIG 183*  HDL 37*  CHOLHDL 6.4  VLDL 37  LDLCALC 161*   HgbA1c:  Recent Labs  Lab 04/19/23 0133  HGBA1C 7.7*   Urine Drug Screen: No results for input(s): "LABOPIA", "COCAINSCRNUR", "LABBENZ", "AMPHETMU", "THCU", "LABBARB" in the last 168 hours.  Alcohol Level No results for input(s): "ETH" in the last 168 hours.  IMAGING past 24  hours ECHOCARDIOGRAM COMPLETE  Result Date: 04/19/2023    ECHOCARDIOGRAM REPORT   Patient Name:   Diana Harper Date of Exam: 04/19/2023 Medical Rec #:  [id number]      Height:       65.0 in Accession #:    [id number]     Weight:       138.4 lb Date of Birth:  [birth year]     BSA:          1.692 m Patient Age:    86 years       BP:           213/94 mmHg Patient Gender: F              HR:           72 bpm. Exam Location:  Inpatient Procedure: 2D Echo, Cardiac Doppler, Color Doppler and Intracardiac            Opacification Agent Indications:   Stroke  History:       Patient has no prior history of Echocardiogram examinations.                Stroke.  Sonographer:   Lucy Antigua Referring  [id number] [facility] KHALIQDINA Phys: IMPRESSIONS  1. Left ventricular ejection fraction, by estimation, is 60 to 65%. The left ventricle has normal function. The left ventricle has no regional wall motion abnormalities. Left ventricular diastolic parameters are consistent with Grade I diastolic dysfunction (impaired relaxation). Elevated left atrial pressure.  2. Right ventricular systolic function is normal. The right ventricular size is normal.  3. The mitral valve is normal in structure. No evidence of mitral valve regurgitation. No evidence of mitral stenosis.  4. The aortic valve is tricuspid. There is moderate calcification of the aortic valve. Aortic valve regurgitation is not visualized. No aortic stenosis is present.  5. The inferior vena cava is normal in size with greater than 50% respiratory variability, suggesting right atrial pressure of 3 mmHg. Comparison(s): No significant change from prior study. Prior images reviewed side by side. FINDINGS  Left Ventricle: Left ventricular ejection fraction, by estimation, is 60 to 65%. The left ventricle has normal function. The left ventricle has no regional wall motion abnormalities. Definity contrast agent was given IV to delineate the left ventricular  endocardial borders. The  left ventricular internal cavity size was normal in size. There is no left ventricular hypertrophy. Left ventricular diastolic parameters are consistent with Grade I diastolic dysfunction (impaired relaxation). Elevated left atrial pressure. Right Ventricle: The right ventricular size is normal. No increase in right ventricular wall thickness. Right ventricular systolic function is normal. Left Atrium: Left atrial size was normal in size. Right Atrium: Right atrial size was normal in size. Pericardium: There is no evidence of pericardial effusion. Mitral Valve: The mitral valve is normal in structure. No evidence of mitral valve regurgitation. No evidence of mitral valve stenosis. Tricuspid Valve: The tricuspid valve is normal in structure. Tricuspid valve regurgitation is not demonstrated. No evidence of tricuspid stenosis. Aortic Valve: Severely calcified, rigid noncoronarty cusp with prreserved mobility of the right and left coronary cusps. The aortic valve is tricuspid. There is moderate calcification of the aortic valve. Aortic valve regurgitation is not visualized. No aortic stenosis is present. Aortic valve mean gradient measures 4.0 mmHg. Aortic valve peak gradient measures 10.5 mmHg. Aortic valve area, by VTI measures 2.02 cm. Pulmonic Valve: The pulmonic valve was normal in structure. Pulmonic valve regurgitation is not visualized. No evidence of pulmonic stenosis. Aorta: The aortic root is normal in size and structure. Venous: The inferior vena cava is normal in size with greater than 50% respiratory variability, suggesting right atrial pressure of 3 mmHg. IAS/Shunts: No atrial level shunt detected by color flow Doppler.  LEFT VENTRICLE PLAX 2D LVIDd:         4.20 cm     Diastology LVIDs:         2.90 cm     LV e' medial:    3.37 cm/s LV PW:         1.00 cm     LV E/e' medial:  17.7 LV IVS:        1.00 cm     LV e' lateral:   3.48 cm/s LVOT diam:     1.90 cm     LV E/e' lateral: 17.1 LV SV:         57 LV  SV Index:   34 LVOT Area:     2.84 cm  LV Volumes (MOD) LV vol d, MOD A4C: 66.5 ml LV vol s, MOD A4C: 17.6 ml LV SV MOD A4C:     66.5 ml RIGHT VENTRICLE RV S prime:  14.30 cm/s TAPSE (M-mode): 2.4 cm LEFT ATRIUM           Index        RIGHT ATRIUM           Index LA Vol (A4C): 33.5 ml 19.80 ml/m  RA Area:     11.90 cm                                    RA Volume:   23.10 ml  13.65 ml/m  AORTIC VALVE AV Area (Vmax):    1.44 cm AV Area (Vmean):   1.69 cm AV Area (VTI):     2.02 cm AV Vmax:           162.00 cm/s AV Vmean:          90.300 cm/s AV VTI:            0.283 m AV Peak Grad:      10.5 mmHg AV Mean Grad:      4.0 mmHg LVOT Vmax:         82.50 cm/s LVOT Vmean:        53.800 cm/s LVOT VTI:          0.202 m LVOT/AV VTI ratio: 0.71  AORTA Ao Root diam: 2.90 cm Ao Asc diam:  3.20 cm MITRAL VALVE MV Area (PHT): 5.13 cm    SHUNTS MV Decel Time: 148 msec    Systemic VTI:  0.20 m MV E velocity: 59.50 cm/s  Systemic Diam: 1.90 cm MV A velocity: 94.00 cm/s MV E/A ratio:  0.63 Mihai Croitoru MD Electronically signed by Thurmon Fair MD Signature Date/Time: 04/19/2023/9:46:47 AM    Final    EEG adult  Result Date: 04/19/2023 Charlsie Quest, MD     04/19/2023  8:58 AM Patient Name: Diana Harper MRN: [id number] Epilepsy Attending: Charlsie Quest Referring Physician/Provider: Erick Blinks, MD Date: 04/19/2023 Duration: 25.23 mins Patient history:  87 y.o. female with PMH significant for CKD, DM2, prior stroke, HTN, HLD, who presents with 2 episodes of passing out with convulsions. EEG to evaluate for seizure. Level of alertness: Awake, asleep AEDs during EEG study: None Technical aspects: This EEG study was done with scalp electrodes positioned according to the 10-20 International system of electrode placement. Electrical activity was reviewed with band pass filter of 1-70Hz , sensitivity of 7 uV/mm, display speed of 51mm/sec with a 60Hz  notched filter applied as appropriate. EEG data were recorded  continuously and digitally stored.  Video monitoring was available and reviewed as appropriate. Description: The posterior dominant rhythm consists of 7 Hz activity of moderate voltage (25-35 uV) seen predominantly in posterior head regions, symmetric and reactive to eye opening and eye closing. Sleep was characterized by vertex waves, sleep spindles (12 to 14 Hz), maximal frontocentral region. EEG showed continuous generalized predominantly 6-7Hz  theta slowing admixed with 2-3Hz  delta slowing. Hyperventilation and photic stimulation were not performed.   ABNORMALITY - Continuous slow, generalized - Background slow IMPRESSION: This study is suggestive of moderate diffuse encephalopathy, nonspecific etiology. No seizures or epileptiform discharges were seen throughout the recording. Priyanka Annabelle Harman   VAS US CAROTID  Result Date: 04/19/2023 Carotid Arterial Duplex Study Patient Name:  Diana Harper  Date of Exam:   04/19/2023 Medical Rec #: [id number]       Accession #:    [id number] Date of Birth: Jan 14, 1936      Patient Gender:  F Patient Age:   8 years Exam Location:  [facility] Procedure:      VAS US CAROTID Referring Phys: Terrilee Files [facility] --------------------------------------------------------------------------------  Indications:       CVA. Risk Factors:      Hypertension, hyperlipidemia, Diabetes, prior CVA. Comparison Study:  02-03-2021 Prior carotid duplex Performing Technologist: Jean Rosenthal RDMS, RVT  Examination Guidelines: A complete evaluation includes B-mode imaging, spectral Doppler, color Doppler, and power Doppler as needed of all accessible portions of each vessel. Bilateral testing is considered an integral part of a complete examination. Limited examinations for reoccurring indications may be performed as noted.  Right Carotid Findings: +----------+--------+--------+--------+------------------+------------------+           PSV cm/sEDV cm/sStenosisPlaque DescriptionComments            +----------+--------+--------+--------+------------------+------------------+ CCA Prox  86      14                                                   +----------+--------+--------+--------+------------------+------------------+ CCA Distal67      11                                intimal thickening +----------+--------+--------+--------+------------------+------------------+ ICA Prox  70      15                                intimal thickening +----------+--------+--------+--------+------------------+------------------+ ICA Distal84      24                                                   +----------+--------+--------+--------+------------------+------------------+ ECA       46                                                           +----------+--------+--------+--------+------------------+------------------+ +----------+--------+-------+----------------+-------------------+           PSV cm/sEDV cmsDescribe        Arm Pressure (mmHG) +----------+--------+-------+----------------+-------------------+ [id number]             Multiphasic, WNL                    +----------+--------+-------+----------------+-------------------+ +---------+--------+--+--------+--+---------+ VertebralPSV cm/s58EDV cm/s11Antegrade +---------+--------+--+--------+--+---------+  Left Carotid Findings: +----------+--------+--------+--------+------------------+------------------+           PSV cm/sEDV cm/sStenosisPlaque DescriptionComments           +----------+--------+--------+--------+------------------+------------------+ CCA Prox  82      12                                                   +----------+--------+--------+--------+------------------+------------------+ CCA Distal58      9  intimal thickening +----------+--------+--------+--------+------------------+------------------+ ICA Prox  63      12                                 intimal thickening +----------+--------+--------+--------+------------------+------------------+ ICA Distal149     26                                tortuous           +----------+--------+--------+--------+------------------+------------------+ ECA       48      5                                                    +----------+--------+--------+--------+------------------+------------------+ +----------+--------+--------+----------------+-------------------+           PSV cm/sEDV cm/sDescribe        Arm Pressure (mmHG) +----------+--------+--------+----------------+-------------------+ [id number]              Multiphasic, WNL                    +----------+--------+--------+----------------+-------------------+ +---------+--------+--+--------+--+---------+ VertebralPSV cm/s53EDV cm/s12Antegrade +---------+--------+--+--------+--+---------+   Summary: Right Carotid: The extracranial vessels were near-normal with only minimal wall                thickening or plaque. Left Carotid: The extracranial vessels were near-normal with only minimal wall               thickening or plaque. Vertebrals:  Bilateral vertebral arteries demonstrate antegrade flow. Subclavians: Normal flow hemodynamics were seen in bilateral subclavian              arteries. *See table(s) above for measurements and observations.     Preliminary    MR ANGIO HEAD WO CONTRAST  Result Date: 04/18/2023 CLINICAL DATA:  Acute neurologic deficit EXAM: MRA HEAD WITHOUT CONTRAST TECHNIQUE: Angiographic images of the Circle of Willis were acquired using MRA technique without intravenous contrast. COMPARISON:  None Available. FINDINGS: POSTERIOR CIRCULATION: --Vertebral arteries: Normal --Inferior cerebellar arteries: Normal. --Basilar artery: Normal. --Superior cerebellar arteries: Severe stenosis of the midportion of the left superior cerebellar artery. Normal right. --Posterior cerebral arteries:  Normal. ANTERIOR CIRCULATION: --Intracranial internal carotid arteries: Normal. --Anterior cerebral arteries (ACA): Normal. --Middle cerebral arteries (MCA): Normal. Anatomic variants: None Other: None. 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 Otherwise normal intracranial MRA. Electronically Signed   By: Deatra Robinson M.D.   On: 04/18/2023 21:54   MR BRAIN WO CONTRAST  Result Date: 04/18/2023 CLINICAL DATA:  Altered mental status EXAM: MRI HEAD WITHOUT CONTRAST TECHNIQUE: Multiplanar, multiecho pulse sequences of the brain and surrounding structures were obtained without intravenous contrast. COMPARISON:  09/13/2022 FINDINGS: Brain: Acute infarct of the right caudate head. Scattered small foci of acute ischemia within the right MCA territory white matter. No acute or chronic hemorrhage. There is confluent hyperintense T2-weighted signal within the white matter. Generalized volume loss. 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 The midline structures are normal. Vascular: Major flow voids are preserved. Skull and upper cervical spine: Normal calvarium and skull base. Visualized upper cervical spine and soft tissues are normal. Sinuses/Orbits:No paranasal sinus fluid levels or advanced mucosal thickening. No mastoid or middle ear effusion. Normal orbits. IMPRESSION: 1. Acute infarct of  the right caudate head. Scattered small foci of acute ischemia within the right MCA territory white matter. No acute hemorrhage or mass effect. 2. 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 Electronically Signed   By: Deatra Robinson M.D.   On: 04/18/2023 20:02   CT Head Wo Contrast  Result Date: 04/18/2023 CLINICAL DATA:  Syncope EXAM: CT HEAD WITHOUT CONTRAST TECHNIQUE: Contiguous axial images were obtained from the base of the skull through the vertex without intravenous contrast. RADIATION DOSE REDUCTION: This exam was  performed according to the departmental dose-optimization program which includes automated exposure control, adjustment of the mA and/or kV according to patient size and/or use of iterative reconstruction technique. COMPARISON:  09/12/2022 FINDINGS: Brain: Stable confluent hypodensities are seen throughout the periventricular white matter, consistent with chronic small vessel ischemic changes. There is a new hypodensity within the right caudate nucleus, consistent with age indeterminate infarct. No evidence of hemorrhage. Lateral ventricles and remaining midline structures are unremarkable. No acute extra-axial fluid collections. No mass effect. Vascular: No hyperdense vessel or unexpected calcification. Skull: Normal. Negative for fracture or focal lesion. Sinuses/Orbits: No acute finding. Other: None. IMPRESSION: 1. New hypodensity within the right caudate nucleus, consistent with age indeterminate ischemic change. If further evaluation is desired, MRI could be considered. 2. Stable chronic small-vessel ischemic changes elsewhere throughout the periventricular and subcortical white matter. 3. No evidence of acute hemorrhage. Electronically Signed   By: Sharlet Salina M.D.   On: 04/18/2023 18:52    PHYSICAL EXAM General: NOAD, sitting up in bed.  Pleasant elderly African-American lady.  Not in distress CV: RR on monitor, no peripheral edema Pulm: Unlabored, room air  NEURO: Mental Status/Cognition:Alert, oriented to self and place but not to month/year/situation.  Good attention but forgetful.  Easily directable.  Diminished attention, registration and recall.  Poor insight into her condition. Speech/language: Fluent with intact object naming and repetition.  Some dysarthria is noted.  However this may be attributed to patient not having her dentures in. CN: No visual field deficits, EOMI, no nystagmus, facial movement and sensation intact bilaterally, bilateral hearing aids at baseline, symmetric head  turn and shoulder shrug with midline tongue protrusion. Motor: Spontaneous movement of all extremities with no drift noted, 5/5 strength. Sensation: decreased in feet (baseline d/t diabetic neuropathy) Coordination: Finger-to-nose, heel-to-shin intact bilaterally.  RAM slow but intact.  NIHSS:  1a Level of Conscious.: 0 1b LOC Questions: 2 (unsure if this is baseline for her) 1c LOC Commands: 0 2 Best Gaze: 0 3 Visual: 0 4 Facial Palsy: 0 5a Motor Arm - left: 0 5b Motor Arm - Right:0  6a Motor Leg - Left: 0 6b Motor Leg - Right: 0 7 Limb Ataxia: 0 8 Sensory: 0 9 Best Language: 0 10 Dysarthria: 1 (could be attributed to patient not having her dentures in) 11 Extinct. and Inatten.: 0 TOTAL: 3   ASSESSMENT/PLAN Diana Harper is a 87 y.o. female with history of CKD, DM2, previous stroke, hypertension, hyperlipidemia, dementia with memory loss presenting with episodes of passing out with convulsions.  MRI brain shows right caudate infarct along with scattered small right MCA stroke.  Patient/daughter report frequent episodes of syncopal/vasovagal nature, along with poor fluid intake.  Acute right caudate, R MCA branch infarcts Etiology: Possibly due to syncopal episode/hypotension/resulting ischemia versus paroxysmal atrial fibrillation,  Code Stroke CT head New hypodensity right caudate nucleus Stable chronic small vessel ischemic changes  MRI   Acute infarct  of the right caudate head Scattered small foci of acute ischemia within the right MCA territory white matter. 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  Bilateral vertebral arteries demonstrate antegrade flow.   Normal flow seen in bilateral subclavian arteries. 2D Echo  LVEF 60 to 65%, grade 1  diastolic dysfunction, elevated left atrial pressure, severely calcified aortic valve with rigid cusp, no shunt.  LDL 163 HgbA1c 7.7 VTE prophylaxis - lovenox    Diet   Diet heart healthy/carb modified Fluid consistency: Thin   aspirin 81 mg daily prior to admission, now on aspirin 81 mg daily and clopidogrel 75 mg daily for 3 weeks, followed by Plavix for monotherapy. Therapy recommendations:  pending Disposition:  pending  Hypertension Home meds: Clonidine patch, Coreg 3.125 twice daily Norvasc 5 mg--on hold Permissive hypertension (OK if < 220/120) but gradually normalize in 2-3 days Long-term BP goal normotensive  Hyperlipidemia Home meds:  none LDL 163, goal < 70 Add Crestor 20mg   Continue statin at discharge  Diabetes type II Uncontrolled Home meds: Tresiba, NovoLog, Trelegy HgbA1c 7.7, goal < 7.0 CBGs Recent Labs    04/19/23 0055 04/19/23 0323 04/19/23 0810  GLUCAP 134* 108* 100*    SSI Recommend close PCP follow-up as A1c is out of goal range while on multiple diabetic medications.  Other Stroke Risk Factors Advanced Age >/= 9   Hx stroke/TIA Family hx stroke (mother)   Other Active Problems Dementia with memory loss Diabetic Neuropathy CKD 3B B/L Hearing aids  Hospital day # 1   Pt seen by Neuro NP/APP and later by MD. Note/plan to be edited by MD as needed.    Lynnae January, DNP, AGACNP-BC [facility] Please use AMION for contact information & EPIC for messaging.  I have personally obtained history,examined this patient, reviewed notes, independently viewed imaging studies, participated in medical decision making and plan of care.ROS completed by me personally and pertinent positives fully documented  I have made any additions or clarifications directly to the above note. Agree with note above.  Patient has history of recurrent syncopal episodes in the past and at this time had somewhat prolonged recovery with some weakness and MRI  scan shows right carotid as well as MCA branch infarcts.  No significant intracranial extracranial stenosis identified on neurovascular testing.  Strong suspicion for paroxysmal A-fib and is recommend prolonged cardiac monitoring for the same.  Recommend aspirin and Plavix for 3 weeks followed by Plavix alone and aggressive risk factor modification.  Long discussion with patient and family at the bedside and answered questions.  Greater than 50% time during this 50-minute visit was spent in counseling and coordination of care about her strokes and syncopal episodes discussion about need for cardiac monitoring and aggressive risk factor modification for stroke prevention and answering questions  Delia Heady, MD Medical Director [facility] Pager: [phone] 04/19/2023 2:21 PM   To contact Stroke Continuity provider, please refer to [email or web address]. After hours, contact General Neurology

## 2023-04-19 NOTE — Discharge Summary (Signed)
Physician Discharge Summary   Patient: Diana Harper MRN: [id number] DOB: [birth year]  Admit date:     04/18/2023  Discharge date: 04/19/23  Discharge Physician: Marguerita Merles. DO   PCP: Lewis Moccasin, MD   Recommendations at discharge:   Follow-up with PCP within 1 to 2 weeks repeat CBC, CMP, mag, Phos within 1 week Follow-up with neurology in outpatient setting and continue dual antiplatelet therapy for 3 weeks and then just Plavix alone Follow-up with cardiology Dr. Jacinto Halim in outpatient setting for Holter monitor and evaluation for Leqvio or Repatha  Discharge Diagnoses: Principal Problem:   Acute CVA (cerebrovascular accident) [facility]) Active Problems:   CVA (cerebral vascular accident) [facility])  Resolved Problems:   * No resolved hospital problems. [facility] Course: The patient is an elderly chronically ill-appearing AAF with a past medical history significant for but not limited to diabetes mellitus type 2, diabetic neuropathy, hypothyroidism, CKD stage IIIb, history of C. difficile infection, asthma as well as other comorbidities who presented to the [facility], ED from home after syncopal episode.  The patient was having a bowel movement while she was being cleaned up she lost consciousness and EMS was activated.  She was brought to the ED for further evaluation.  Patient had poor recollection of the events but the neurologist spoke with the daughter and the daughter had reported that she had a small bowel movement at bedside commode and sat cushion to eat her brunch and then called her daughter and the daughter went to go check on her found her convulsing on the seat with her head leaning back.  She convulsions for about 2 minutes and then she passed out for another 2 minutes and was lethargic tired when she woke up.  EMS arrived and they are on their way and she had another large bowel movement and EMS helped her her daughter cleaned her and while EMS was moving her she had another  syncopal episode.  She was brought to the ED and further workup was done and an MRI brain was obtained which demonstrated a right caudate infarct along with scattered small right MCA stroke.   The patient has a history of vasovagal syncope but never had convulsions in the past and no family history of seizures.  She underwent further workup by the neurologist and they recommended an MRI of the head without contrast which showed severe stenosis of the midportion of the left superior cerebellar artery with an otherwise normal intracranial MRI.  Neurology recommended obtaining bilateral carotid Dopplers as well as echocardiogram which is still pending.  PT OT evaluation is also pending and neurology recommended EEG which has been done and showed that the study was suggestive of moderate diffuse encephalopathy with nonspecific etiology and no seizures or epileptiform discharges were seen throughout the recording.  Neurology and stroke team is following and she completed her workup.  Neurology recommended dual antiplatelet therapy for 3 weeks and then just Plavix monotherapy.  Neurology recommended Holter monitor and cardiology is arranging.  She is medically stable to be discharged at this time will need follow-up with PCP, neurology as well as cardiology in outpatient setting given that she has completed her stroke workup  Assessment and Plan:  Acute CVA involving right caudate head and right MCA branch and proximal Seizures vs Convulsive Syncope, neurology feels this is less likely and they feel that is more likely recurrent syncopal episodes -Scattered small foci of acute ischemia within the right MCA territory white matter.  No acute hemorrhage or mass effect. Old posterior left parietal lobe infarct and white matter changes of chronic small vessel ischemia Ongoing stroke workup -MRA brain without contrast revealed severe stenosis of the midportion of the left superior cerebral artery. -Bilateral carotid  artery Doppler ultrasound is  Neurochecks every 4 hours Complete 2D echo done and showed "1. Left ventricular ejection fraction, by estimation, is 60 to 65%. The  left ventricle has normal function. The left ventricle has no regional wall motion abnormalities. Left ventricular diastolic parameters are consistent with Grade I diastolic dysfunction (impaired relaxation). Elevated left atrial pressure. 2. Right ventricular systolic function is normal. The right ventricular  size is normal.  3. The mitral valve is normal in structure. No evidence of mitral valve regurgitation. No evidence of mitral stenosis. 4. The aortic valve is tricuspid. There is moderate calcification of the aortic valve. Aortic valve regurgitation is not visualized. No aortic stenosis is present. 5. The inferior vena cava is normal in size with greater than 50% respiratory variability, suggesting right atrial pressure of 3 mmHg. Comparison(s): No significant change from prior study. Prior images reviewed side by side." -EEG done and showed that the study was suggestive of moderate diffuse encephalopathy of nonspecific etiology with no seizures or epileptiform discharges seen throughout the recording -Fasting lipid panel and A1c below PT/OT/speech therapist evaluation and they are recommending home health -Fall and aspiration precautions. Neurology consulted, appreciate assistance. -DAPT aspirin 81 mg daily and Plavix 75 mg daily x 21 days then antiplatelet monotherapy Plavix mg daily alone.  -Possible allergy to statin. -Permissive hypertension, treat SBP greater than 220 or DBP greater than 120. -Echocardiogram as below -Repeat orthostatic vital signs -She is cleared from a neurology perspective to be discharged and cardiology and Holter monitor  Type 2 diabetes with hyperglycemia -Serum glucose 192 on presentation -Obtain hemoglobin A1c and was 7.8 -Sliding scale insulin while hospitalized with sensitive NovoLog/scale insulin  every 4   HFpEF 60 to 65% -Last 2D echo done on 09/14/2022 revealed LVEF 60 to 65% with grade 1 diastolic dysfunction. -Closely monitor volume status while on gentle IV fluid hydration at 50 MLS per hour and decision to stop after 12 hours -Strict I's and O's and daily weights No intake or output data in the 24 hours ending 04/19/23 1007  -Repeat echocardiogram has been ordered and pending -Continue to monitor for signs and symptoms of volume overload and follow in the outpatient setting   Hypothyroidism -Resume home Levothyroxine -Check TSH i in the outpatient setting   AKI on CKD Stage 3b Metabolic Acidosis -Baseline creatinine appears to be 1.4 with a GFR 36 -BUN/Cr Trend: Last Labs       Recent Labs  Lab 04/18/23 1626 04/18/23 1638 04/19/23 0133  BUN 24* 29* 19  CREATININE 2.05* 2.10* 1.42*    -IV fluid hydration has been initiated 50 MLS per hour and will stop after 12 more hours -She has a slight metabolic acidosis with a CO2 of 21, anion gap of 8, chloride level of 111 -Avoid Nephrotoxic Medications, Contrast Dyes, Hypotension and Dehydration to Ensure Adequate Renal Perfusion and will need to Renally Adjust Meds -Continue to Monitor and Trend Renal Function carefully and repeat CMP within 1 week    Hypomagnesemia -Patient's Mag Level Trend: Last Labs     Recent Labs  Lab 04/19/23 0133  MG 1.5*    -Replete with IV Mag Sulfate 2 grams -Continue to Monitor and Replete as Necessary -Repeat Mag within 1 week  Hyperlipidemia -Patient had a lipid panel done this morning which showed a total cholesterol/HDL ratio 6.4, cholesterol level 237, HDL 37, LDL 163, triglycerides of 295 and VLDL of 37 -Has a Simvastatin allergy so we will need to verify with pharmacy if she can take statins and after further discussion with neurology and pharmacy we will discontinue any statins and start her on outpatient Repatha or Leqvio; cardiology Dr. Gwynneth Macleod to arrange   Normocytic  Anemia -Hgb/Hct Trend: Last Labs       Recent Labs  Lab 04/18/23 1626 04/18/23 1638 04/19/23 0133  HGB 10.9* 10.5* 10.3*  HCT 33.9* 31.0* 30.7*  MCV 89.0  --  85.5    -Check anemia panel in the outpatient setting  -Continue to monitor for signs of bleeding; no overt bleeding noted -Repeat CBC in 1 week   Hypoalbuminemia -Patient's Albumin Trend: Last Labs     Recent Labs  Lab 04/18/23 1626  ALBUMIN 3.1*    -Continue to Monitor and Trend and repeat CMP within 1 week  Consultants: Neurology Procedures performed: As delineated as above Disposition: Home health Diet recommendation:  Cardiac and Carb modified diet DISCHARGE MEDICATION: Allergies as of 04/19/2023       Reactions   Latex Anaphylaxis, Shortness Of Breath, Other (See Comments)   Severe respiratory distress   Onion Diarrhea   Other Other (See Comments)   Patient cannot have TREE NUTS and anything with seeds- History of  diverticulitis    Flexeril [cyclobenzaprine] Other (See Comments)   Caused agitation   Lisinopril Cough   Metformin And Related Diarrhea   Simvastatin Hives, Rash   Tetracycline Rash   Zithromax [azithromycin] Rash        Medication List     STOP taking these medications    amLODipine 5 MG tablet Commonly known as: NORVASC   buPROPion 150 MG 24 hr tablet Commonly known as: WELLBUTRIN XL   cloNIDine 0.1 mg/24hr patch Commonly known as: CATAPRES - Dosed in mg/24 hr   cyanocobalamin 1000 MCG tablet Commonly known as: VITAMIN B12   Trelegy Ellipta 200-62.5-25 MCG/ACT Aepb Generic drug: Fluticasone-Umeclidin-Vilant   vancomycin 125 MG capsule Commonly known as: VANCOCIN   Vitamin B-12 1000 MCG Subl       TAKE these medications    acetaminophen 500 MG tablet Commonly known as: TYLENOL Take 1,000 mg by mouth every 8 (eight) hours as needed for mild pain, headache or fever.   Advair HFA 230-21 MCG/ACT inhaler Generic drug: fluticasone-salmeterol Inhale 2 puffs into  the lungs 2 (two) times daily.   aspirin EC 81 MG tablet Take 1 tablet (81 mg total) by mouth daily for 21 days.   CALTRATE 600 PO Take 600 mg by mouth daily.   carvedilol 3.125 MG tablet Commonly known as: COREG Take 3.125 mg by mouth 2 (two) times daily with a meal.   clopidogrel 75 MG tablet Commonly known as: PLAVIX Take 1 tablet (75 mg total) by mouth daily. Start taking on: Apr 20, 2023   folic acid 1 MG tablet Commonly known as: FOLVITE Take 1 tablet (1 mg total) by mouth daily.   FreeStyle Libre 14 Day Sensor Misc Inject 1 Device into the skin every 14 (fourteen) days.   FreeStyle [place] 2 Reader Hardie Pulley See admin instructions.   Glucagon HCl 1 MG Solr Inject 1 mg into the skin as needed (for a blood sugar <67).   insulin degludec 200 UNIT/ML FlexTouch Pen Commonly known as: TRESIBA Inject 10 Units into the  skin See admin instructions. Inject 30 units into the skin daily before brunch What changed: how much to take   levothyroxine 88 MCG tablet Commonly known as: SYNTHROID Take 88 mcg by mouth daily before breakfast.   nitroGLYCERIN 0.4 MG SL tablet Commonly known as: NITROSTAT Place 1 tablet (0.4 mg total) under the tongue every 5 (five) minutes as needed for chest pain.   NovoLOG FlexPen 100 UNIT/ML FlexPen Generic drug: insulin aspart Inject 5-10 Units into the skin See admin instructions. Inject 5 units into the skin before meals if BGL is 100-150, 7 units if BGL is 151-200, and 10 units if BGL is greater than 200   sertraline 50 MG tablet Commonly known as: ZOLOFT Take 150 mg by mouth at bedtime.   thiamine 100 MG tablet Commonly known as: VITAMIN B1 Take 1 tablet (100 mg total) by mouth daily.   Vitamin D3 25 MCG (1000 UT) Caps Take 1,000 Units by mouth daily.        Discharge Exam: There were no vitals filed for this visit. Vitals:   04/19/23 1245 04/19/23 1400  BP:  (!) 181/81  Pulse:  79  Resp:  20  Temp: 98.1 F (36.7 C) 99 F (37.2  C)  SpO2:  100%   Examination: Physical Exam:  Constitutional: WN/WD chronically ill African-American female in no acute distress appears calm Respiratory: Diminished to auscultation bilaterally, no wheezing, rales, rhonchi or crackles. Normal respiratory effort and patient is not tachypenic. No accessory muscle use.  Unlabored breathing Cardiovascular: RRR, no murmurs / rubs / gallops. S1 and S2 auscultated. No extremity edema. Abdomen: Soft, non-tender, non-distended. Bowel sounds positive.  GU: Deferred. Musculoskeletal: No clubbing / cyanosis of digits/nails. No joint deformity upper and lower extremities. Skin: No rashes, lesions, ulcers on a limited skin evaluation. No induration; Warm and dry.  Neurologic: CN 2-12 grossly intact with no focal deficits. Romberg sign and cerebellar reflexes not assessed.  Psychiatric: Normal judgment and insight.  Condition at discharge: stable  The results of significant diagnostics from this hospitalization (including imaging, microbiology, ancillary and laboratory) are listed below for reference.   Imaging Studies: ECHOCARDIOGRAM COMPLETE  Result Date: 04/19/2023    ECHOCARDIOGRAM REPORT   Patient Name:   KAELEEN POWELL Date of Exam: 04/19/2023 Medical Rec #:  [id number]      Height:       65.0 in Accession #:    [id number]     Weight:       138.4 lb Date of Birth:  08-18-1936     BSA:          1.692 m Patient Age:    86 years       BP:           213/94 mmHg Patient Gender: F              HR:           72 bpm. Exam Location:  Inpatient Procedure: 2D Echo, Cardiac Doppler, Color Doppler and Intracardiac            Opacification Agent Indications:   Stroke  History:       Patient has no prior history of Echocardiogram examinations.                Stroke.  Sonographer:   Lucy Antigua Referring      [phone] [facility] Phys: IMPRESSIONS  1. Left ventricular ejection fraction, by estimation, is 60 to 65%. The left ventricle has normal  function. The  left ventricle has no regional wall motion abnormalities. Left ventricular diastolic parameters are consistent with Grade I diastolic dysfunction (impaired relaxation). Elevated left atrial pressure.  2. Right ventricular systolic function is normal. The right ventricular size is normal.  3. The mitral valve is normal in structure. No evidence of mitral valve regurgitation. No evidence of mitral stenosis.  4. The aortic valve is tricuspid. There is moderate calcification of the aortic valve. Aortic valve regurgitation is not visualized. No aortic stenosis is present.  5. The inferior vena cava is normal in size with greater than 50% respiratory variability, suggesting right atrial pressure of 3 mmHg. Comparison(s): No significant change from prior study. Prior images reviewed side by side. FINDINGS  Left Ventricle: Left ventricular ejection fraction, by estimation, is 60 to 65%. The left ventricle has normal function. The left ventricle has no regional wall motion abnormalities. Definity contrast agent was given IV to delineate the left ventricular  endocardial borders. The left ventricular internal cavity size was normal in size. There is no left ventricular hypertrophy. Left ventricular diastolic parameters are consistent with Grade I diastolic dysfunction (impaired relaxation). Elevated left atrial pressure. Right Ventricle: The right ventricular size is normal. No increase in right ventricular wall thickness. Right ventricular systolic function is normal. Left Atrium: Left atrial size was normal in size. Right Atrium: Right atrial size was normal in size. Pericardium: There is no evidence of pericardial effusion. Mitral Valve: The mitral valve is normal in structure. No evidence of mitral valve regurgitation. No evidence of mitral valve stenosis. Tricuspid Valve: The tricuspid valve is normal in structure. Tricuspid valve regurgitation is not demonstrated. No evidence of tricuspid stenosis. Aortic Valve:  Severely calcified, rigid noncoronarty cusp with prreserved mobility of the right and left coronary cusps. The aortic valve is tricuspid. There is moderate calcification of the aortic valve. Aortic valve regurgitation is not visualized. No aortic stenosis is present. Aortic valve mean gradient measures 4.0 mmHg. Aortic valve peak gradient measures 10.5 mmHg. Aortic valve area, by VTI measures 2.02 cm. Pulmonic Valve: The pulmonic valve was normal in structure. Pulmonic valve regurgitation is not visualized. No evidence of pulmonic stenosis. Aorta: The aortic root is normal in size and structure. Venous: The inferior vena cava is normal in size with greater than 50% respiratory variability, suggesting right atrial pressure of 3 mmHg. IAS/Shunts: No atrial level shunt detected by color flow Doppler.  LEFT VENTRICLE PLAX 2D LVIDd:         4.20 cm     Diastology LVIDs:         2.90 cm     LV e' medial:    3.37 cm/s LV PW:         1.00 cm     LV E/e' medial:  17.7 LV IVS:        1.00 cm     LV e' lateral:   3.48 cm/s LVOT diam:     1.90 cm     LV E/e' lateral: 17.1 LV SV:         57 LV SV Index:   34 LVOT Area:     2.84 cm  LV Volumes (MOD) LV vol d, MOD A4C: 66.5 ml LV vol s, MOD A4C: 17.6 ml LV SV MOD A4C:     66.5 ml RIGHT VENTRICLE RV S prime:     14.30 cm/s TAPSE (M-mode): 2.4 cm LEFT ATRIUM           Index  RIGHT ATRIUM           Index LA Vol (A4C): 33.5 ml 19.80 ml/m  RA Area:     11.90 cm                                    RA Volume:   23.10 ml  13.65 ml/m  AORTIC VALVE AV Area (Vmax):    1.44 cm AV Area (Vmean):   1.69 cm AV Area (VTI):     2.02 cm AV Vmax:           162.00 cm/s AV Vmean:          90.300 cm/s AV VTI:            0.283 m AV Peak Grad:      10.5 mmHg AV Mean Grad:      4.0 mmHg LVOT Vmax:         82.50 cm/s LVOT Vmean:        53.800 cm/s LVOT VTI:          0.202 m LVOT/AV VTI ratio: 0.71  AORTA Ao Root diam: 2.90 cm Ao Asc diam:  3.20 cm MITRAL VALVE MV Area (PHT): 5.13 cm    SHUNTS  MV Decel Time: 148 msec    Systemic VTI:  0.20 m MV E velocity: 59.50 cm/s  Systemic Diam: 1.90 cm MV A velocity: 94.00 cm/s MV E/A ratio:  0.63 Mihai Croitoru MD Electronically signed by Thurmon Fair MD Signature Date/Time: 04/19/2023/9:46:47 AM    Final    EEG adult  Result Date: 04/19/2023 Charlsie Quest, MD     04/19/2023  8:58 AM Patient Name: Mozetta Wadding MRN: [id number] Epilepsy Attending: Charlsie Quest Referring Physician/Provider: Erick Blinks, MD Date: 04/19/2023 Duration: 25.23 mins Patient history:  87 y.o. female with PMH significant for CKD, DM2, prior stroke, HTN, HLD, who presents with 2 episodes of passing out with convulsions. EEG to evaluate for seizure. Level of alertness: Awake, asleep AEDs during EEG study: None Technical aspects: This EEG study was done with scalp electrodes positioned according to the 10-20 International system of electrode placement. Electrical activity was reviewed with band pass filter of 1-70Hz , sensitivity of 7 uV/mm, display speed of 29mm/sec with a 60Hz  notched filter applied as appropriate. EEG data were recorded continuously and digitally stored.  Video monitoring was available and reviewed as appropriate. Description: The posterior dominant rhythm consists of 7 Hz activity of moderate voltage (25-35 uV) seen predominantly in posterior head regions, symmetric and reactive to eye opening and eye closing. Sleep was characterized by vertex waves, sleep spindles (12 to 14 Hz), maximal frontocentral region. EEG showed continuous generalized predominantly 6-7Hz  theta slowing admixed with 2-3Hz  delta slowing. Hyperventilation and photic stimulation were not performed.   ABNORMALITY - Continuous slow, generalized - Background slow IMPRESSION: This study is suggestive of moderate diffuse encephalopathy, nonspecific etiology. No seizures or epileptiform discharges were seen throughout the recording. Priyanka O Yadav   VAS US CAROTID  Result Date:  04/19/2023 Carotid Arterial Duplex Study Patient Name:  NALANI HOELZLE  Date of Exam:   04/19/2023 Medical Rec #: [id number]       Accession #:    [id number] Date of Birth: 07/26/1936      Patient Gender: F Patient Age:   87 years Exam Location:  [facility] Procedure:      VAS US CAROTID Referring Phys: Terrilee Files  KHALIQDINA --------------------------------------------------------------------------------  Indications:       CVA. Risk Factors:      Hypertension, hyperlipidemia, Diabetes, prior CVA. Comparison Study:  02-03-2021 Prior carotid duplex Performing Technologist: Jean Rosenthal RDMS, RVT  Examination Guidelines: A complete evaluation includes B-mode imaging, spectral Doppler, color Doppler, and power Doppler as needed of all accessible portions of each vessel. Bilateral testing is considered an integral part of a complete examination. Limited examinations for reoccurring indications may be performed as noted.  Right Carotid Findings: +----------+--------+--------+--------+------------------+------------------+           PSV cm/sEDV cm/sStenosisPlaque DescriptionComments           +----------+--------+--------+--------+------------------+------------------+ CCA Prox  86      14                                                   +----------+--------+--------+--------+------------------+------------------+ CCA Distal67      11                                intimal thickening +----------+--------+--------+--------+------------------+------------------+ ICA Prox  70      15                                intimal thickening +----------+--------+--------+--------+------------------+------------------+ ICA Distal84      24                                                   +----------+--------+--------+--------+------------------+------------------+ ECA       46                                                            +----------+--------+--------+--------+------------------+------------------+ +----------+--------+-------+----------------+-------------------+           PSV cm/sEDV cmsDescribe        Arm Pressure (mmHG) +----------+--------+-------+----------------+-------------------+ [id number]             Multiphasic, WNL                    +----------+--------+-------+----------------+-------------------+ +---------+--------+--+--------+--+---------+ VertebralPSV cm/s58EDV cm/s11Antegrade +---------+--------+--+--------+--+---------+  Left Carotid Findings: +----------+--------+--------+--------+------------------+------------------+           PSV cm/sEDV cm/sStenosisPlaque DescriptionComments           +----------+--------+--------+--------+------------------+------------------+ CCA Prox  82      12                                                   +----------+--------+--------+--------+------------------+------------------+ CCA Distal58      9                                 intimal thickening +----------+--------+--------+--------+------------------+------------------+ ICA Prox  63      12  intimal thickening +----------+--------+--------+--------+------------------+------------------+ ICA Distal149     26                                tortuous           +----------+--------+--------+--------+------------------+------------------+ ECA       48      5                                                    +----------+--------+--------+--------+------------------+------------------+ +----------+--------+--------+----------------+-------------------+           PSV cm/sEDV cm/sDescribe        Arm Pressure (mmHG) +----------+--------+--------+----------------+-------------------+ [id number]              Multiphasic, WNL                    +----------+--------+--------+----------------+-------------------+  +---------+--------+--+--------+--+---------+ VertebralPSV cm/s53EDV cm/s12Antegrade +---------+--------+--+--------+--+---------+   Summary: Right Carotid: The extracranial vessels were near-normal with only minimal wall                thickening or plaque. Left Carotid: The extracranial vessels were near-normal with only minimal wall               thickening or plaque. Vertebrals:  Bilateral vertebral arteries demonstrate antegrade flow. Subclavians: Normal flow hemodynamics were seen in bilateral subclavian              arteries. *See table(s) above for measurements and observations.     Preliminary    MR ANGIO HEAD WO CONTRAST  Result Date: 04/18/2023 CLINICAL DATA:  Acute neurologic deficit EXAM: MRA HEAD WITHOUT CONTRAST TECHNIQUE: Angiographic images of the Circle of Willis were acquired using MRA technique without intravenous contrast. COMPARISON:  None Available. FINDINGS: POSTERIOR CIRCULATION: --Vertebral arteries: Normal --Inferior cerebellar arteries: Normal. --Basilar artery: Normal. --Superior cerebellar arteries: Severe stenosis of the midportion of the left superior cerebellar artery. Normal right. --Posterior cerebral arteries: Normal. ANTERIOR CIRCULATION: --Intracranial internal carotid arteries: Normal. --Anterior cerebral arteries (ACA): Normal. --Middle cerebral arteries (MCA): Normal. Anatomic variants: None Other: None. 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 Otherwise normal intracranial MRA. Electronically Signed   By: Deatra Robinson M.D.   On: 04/18/2023 21:54   MR BRAIN WO CONTRAST  Result Date: 04/18/2023 CLINICAL DATA:  Altered mental status EXAM: MRI HEAD WITHOUT CONTRAST TECHNIQUE: Multiplanar, multiecho pulse sequences of the brain and surrounding structures were obtained without intravenous contrast. COMPARISON:  09/13/2022 FINDINGS: Brain: Acute infarct of the right caudate head. Scattered small foci of acute ischemia  within the right MCA territory white matter. No acute or chronic hemorrhage. There is confluent hyperintense T2-weighted signal within the white matter. Generalized volume loss. 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 The midline structures are normal. Vascular: Major flow voids are preserved. Skull and upper cervical spine: Normal calvarium and skull base. Visualized upper cervical spine and soft tissues are normal. Sinuses/Orbits:No paranasal sinus fluid levels or advanced mucosal thickening. No mastoid or middle ear effusion. Normal orbits. IMPRESSION: 1. Acute infarct of the right caudate head. Scattered small foci of acute ischemia within the right MCA territory white matter. No acute hemorrhage or mass effect. 2. 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 Electronically Signed   By:  Deatra Robinson M.D.   On: 04/18/2023 20:02   CT Head Wo Contrast  Result Date: 04/18/2023 CLINICAL DATA:  Syncope EXAM: CT HEAD WITHOUT CONTRAST TECHNIQUE: Contiguous axial images were obtained from the base of the skull through the vertex without intravenous contrast. RADIATION DOSE REDUCTION: This exam was performed according to the departmental dose-optimization program which includes automated exposure control, adjustment of the mA and/or kV according to patient size and/or use of iterative reconstruction technique. COMPARISON:  09/12/2022 FINDINGS: Brain: Stable confluent hypodensities are seen throughout the periventricular white matter, consistent with chronic small vessel ischemic changes. There is a new hypodensity within the right caudate nucleus, consistent with age indeterminate infarct. No evidence of hemorrhage. Lateral ventricles and remaining midline structures are unremarkable. No acute extra-axial fluid collections. No mass effect. Vascular: No hyperdense vessel or unexpected calcification. Skull: Normal. Negative for fracture or focal lesion.  Sinuses/Orbits: No acute finding. Other: None. IMPRESSION: 1. New hypodensity within the right caudate nucleus, consistent with age indeterminate ischemic change. If further evaluation is desired, MRI could be considered. 2. Stable chronic small-vessel ischemic changes elsewhere throughout the periventricular and subcortical white matter. 3. No evidence of acute hemorrhage. Electronically Signed   By: Sharlet Salina M.D.   On: 04/18/2023 18:52    Microbiology: Results for orders placed or performed during the hospital encounter of 11/10/22  Urine Culture     Status: Abnormal   Collection Time: 11/10/22  2:48 PM   Specimen: Urine, Clean Catch  Result Value Ref Range Status   Specimen Description   Final    URINE, CLEAN CATCH Performed at [facility], [address], [place], [place] [address]    Special Requests   Final    NONE Performed at [facility], [address] [place], [place] [address]    Culture 50,000 COLONIES/mL PROTEUS MIRABILIS (A)  Final   Report Status 11/12/2022 FINAL  Final   Organism ID, Bacteria PROTEUS MIRABILIS (A)  Final      Susceptibility   Proteus mirabilis - MIC*    AMPICILLIN <=2 SENSITIVE Sensitive     CEFAZOLIN 8 SENSITIVE Sensitive     CEFEPIME <=0.12 SENSITIVE Sensitive     CEFTRIAXONE <=0.25 SENSITIVE Sensitive     CIPROFLOXACIN <=0.25 SENSITIVE Sensitive     GENTAMICIN <=1 SENSITIVE Sensitive     IMIPENEM 8 INTERMEDIATE Intermediate     NITROFURANTOIN 128 RESISTANT Resistant     TRIMETH/SULFA <=20 SENSITIVE Sensitive     AMPICILLIN/SULBACTAM <=2 SENSITIVE Sensitive     PIP/TAZO <=4 SENSITIVE Sensitive     * 50,000 COLONIES/mL PROTEUS MIRABILIS    Labs: CBC: Recent Labs  Lab 04/18/23 1626 04/18/23 1638 04/19/23 0133  WBC 6.4  --  6.6  NEUTROABS 3.6  --   --   HGB 10.9* 10.5* 10.3*  HCT 33.9* 31.0* 30.7*  MCV 89.0  --  85.5  PLT 194  --  PLATELET CLUMPS NOTED ON SMEAR, UNABLE TO ESTIMATE   Basic  Metabolic Panel: Recent Labs  Lab 04/18/23 1626 04/18/23 1638 04/19/23 0133  NA 139 141 140  K 3.7 3.6 4.1  CL 104 105 111  CO2 26  --  21*  GLUCOSE 192* 177* 125*  BUN 24* 29* 19  CREATININE 2.05* 2.10* 1.42*  CALCIUM 9.0  --  8.2*  MG  --   --  1.5*  PHOS  --   --  2.8   Liver Function Tests: Recent Labs  Lab 04/18/23  1626  AST 25  ALT 19  ALKPHOS 53  BILITOT 0.4  PROT 6.3*  ALBUMIN 3.1*   CBG: Recent Labs  Lab 04/19/23 0055 04/19/23 0323 04/19/23 0810 04/19/23 1204 04/19/23 1633  GLUCAP 134* 108* 100* 161* 163*   Discharge time spent: greater than 30 minutes.  Signed: Marguerita Merles, DO [facility] 04/19/2023

## 2023-04-20 DIAGNOSIS — I639 Cerebral infarction, unspecified: Secondary | ICD-10-CM

## 2023-04-20 DIAGNOSIS — I669 Occlusion and stenosis of unspecified cerebral artery: Secondary | ICD-10-CM

## 2023-04-20 DIAGNOSIS — I480 Paroxysmal atrial fibrillation: Secondary | ICD-10-CM

## 2023-04-20 DIAGNOSIS — I63311 Cerebral infarction due to thrombosis of right middle cerebral artery: Secondary | ICD-10-CM | POA: Diagnosis not present

## 2023-04-20 LAB — GLUCOSE, CAPILLARY
Glucose-Capillary: 127 mg/dL — ABNORMAL HIGH (ref 70–99)
Glucose-Capillary: 157 mg/dL — ABNORMAL HIGH (ref 70–99)
Glucose-Capillary: 139 mg/dL — ABNORMAL HIGH (ref 70–99)
Glucose-Capillary: 153 mg/dL — ABNORMAL HIGH (ref 70–99)
Glucose-Capillary: 138 mg/dL — ABNORMAL HIGH (ref 70–99)

## 2023-04-20 MED ORDER — CLONIDINE HCL 0.1 MG PO TABS
0.1000 mg | ORAL_TABLET | Freq: Two times a day (BID) | ORAL | Status: DC
  Administered 2023-04-20 – 2023-04-21 (×3): 0.1 mg via ORAL
  Filled 2023-04-20 (×3): qty 1

## 2023-04-20 MED ORDER — HYDRALAZINE HCL 25 MG PO TABS: 25.0000 mg | ORAL_TABLET | Freq: Four times a day (QID) | ORAL | Status: DC | PRN

## 2023-04-20 NOTE — TOC Transition Note (Signed)
Transition of Care [facility]) - CM/SW Discharge Note   Patient Details  Name: Diana Harper MRN: [id number] Date of Birth: [birth year]  Transition of Care [facility]) CM/SW Contact:  Kermit Balo, RN Phone Number: 04/20/2023, 10:31 AM   Clinical Narrative:    Pt will discharge home today. [facility] already active with the patient and will resume home health services after d/c. Information for [facility] on the AVS.  Daughter will provide transportation home.   Final next level of care: Home w Home Health Services Barriers to Discharge: No Barriers Identified   Patient Goals and CMS Choice CMS Medicare.gov Compare Post Acute Care list provided to:: Patient Represenative (must comment) Choice offered to / list presented to : Adult Children  Discharge Placement                         Discharge Plan and Services Additional resources added to the After Visit Summary for     Discharge Planning Services: CM Consult Post Acute Care Choice: Home Health                    HH Arranged: PT, OT [facility] Agency: [facility] Date [facility] Agency Contacted: 04/19/23   Representative spoke with at [facility] Agency: Ephriam Knuckles  Social Determinants of Health (SDOH) Interventions SDOH Screenings   Food Insecurity: No Food Insecurity (04/19/2023)  Housing: Low Risk  (04/19/2023)  Transportation Needs: No Transportation Needs (04/19/2023)  Utilities: Not At Risk (04/19/2023)  Tobacco Use: Low Risk  (04/18/2023)     Readmission Risk Interventions    07/27/2022   10:46 AM  Readmission Risk Prevention Plan  Transportation Screening Complete  PCP or Specialist Appt within 3-5 Days Complete  HRI or Home Care Consult Complete  Social Work Consult for Recovery Care Planning/Counseling Complete  Palliative Care Screening Not Applicable  Medication Review Oceanographer) Complete

## 2023-04-20 NOTE — Progress Notes (Signed)
Physical Therapy Treatment Patient Details Name: Diana Harper MRN: [id number] DOB: Apr 24, 1936 Today's Date: 04/20/2023   History of Present Illness Pt is 87 yo female admitted on 04/18/23 after being found convulsing on [facility], then another episode when EMS arrived.  Pt found to have R caudate acute infarct and scattered small R MCA stroke.  Pt with hx including but not limited to  type 2 diabetes, diabetic polyneuropathy, hypothyroidism, CKD 3B, history of C. difficile infection, asthma    PT Comments    Pt pleasant and agreeable to participate. Overall requiring minimal assist for functional mobility. Ambulating ~60 ft with a walker, with cueing for larger step lengths and stepping initiation. Likely not too far off of functional baseline and will benefit from continued HHPT to address balance, strengthening, activity tolerance.    Recommendations for follow up therapy are one component of a multi-disciplinary discharge planning process, led by the attending physician.  Recommendations may be updated based on patient status, additional functional criteria and insurance authorization.  Follow Up Recommendations       Assistance Recommended at Discharge Frequent or constant Supervision/Assistance  Patient can return home with the following A little help with walking and/or transfers;A little help with bathing/dressing/bathroom;Assistance with cooking/housework;Help with stairs or ramp for entrance   Equipment Recommendations  None recommended by PT    Recommendations for Other Services       Precautions / Restrictions Precautions Precautions: Fall Restrictions Weight Bearing Restrictions: No     Mobility  Bed Mobility Overal bed mobility: Needs Assistance Bed Mobility: Supine to Sit     Supine to sit: Min assist     General bed mobility comments: Pt initiating well, minA at trunk to execute and use of bed pad to scoot hips out    Transfers Overall transfer level: Needs  assistance Equipment used: Rolling walker (2 wheels) Transfers: Sit to/from Stand Sit to Stand: Min assist           General transfer comment: MinA to power up and steady    Ambulation/Gait Ambulation/Gait assistance: Min assist Gait Distance (Feet): 60 Feet Assistive device: Rolling walker (2 wheels) Gait Pattern/deviations: Step-to pattern, Decreased stride length, Drifts right/left, Step-through pattern Gait velocity: decreased Gait velocity interpretation: <1.8 ft/sec, indicate of risk for recurrent falls   General Gait Details: Verbal cues for larger step lengths, intermittent assist for steering walker, light minA for balance. Pt able to verbalize when she needed to sit and rest   Stairs             Wheelchair Mobility    Modified Rankin (Stroke Patients Only)       Balance Overall balance assessment: Needs assistance, History of Falls Sitting-balance support: No upper extremity supported, Bilateral upper extremity supported Sitting balance-Leahy Scale: Fair     Standing balance support: Bilateral upper extremity supported Standing balance-Leahy Scale: Poor Standing balance comment: RW and min A at times                            Cognition Arousal/Alertness: Awake/alert Behavior During Therapy: WFL for tasks assessed/performed Overall Cognitive Status: History of cognitive impairments - at baseline                                 General Comments: Pt pleasant, able to verbalize basic needs i.e. to sit when fatigued  Exercises      General Comments        Pertinent Vitals/Pain Pain Assessment Pain Assessment: No/denies pain    Home Living                          Prior Function            PT Goals (current goals can now be found in the care plan section) Acute Rehab PT Goals Patient Stated Goal: return home Potential to Achieve Goals: Good Progress towards PT goals: Progressing toward  goals    Frequency    Min 4X/week      PT Plan Current plan remains appropriate    Co-evaluation              AM-PAC PT "6 Clicks" Mobility   Outcome Measure  Help needed turning from your back to your side while in a flat bed without using bedrails?: A Little Help needed moving from lying on your back to sitting on the side of a flat bed without using bedrails?: A Little Help needed moving to and from a bed to a chair (including a wheelchair)?: A Little Help needed standing up from a chair using your arms (e.g., wheelchair or bedside chair)?: A Little Help needed to walk in hospital room?: A Little Help needed climbing 3-5 steps with a railing? : A Lot 6 Click Score: 17    End of Session Equipment Utilized During Treatment: Gait belt Activity Tolerance: Patient tolerated treatment well Patient left: in chair;with call bell/phone within reach;with chair alarm set Nurse Communication: Mobility status PT Visit Diagnosis: Other abnormalities of gait and mobility (R26.89);History of falling (Z91.81)     Time: 4034-7425 PT Time Calculation (min) (ACUTE ONLY): 22 min  Charges:  $Gait Training: 8-22 mins                     Lillia Pauls, PT, DPT Acute Rehabilitation Services Office [phone]    Diana Harper 04/20/2023, 10:18 AM

## 2023-04-20 NOTE — Progress Notes (Signed)
PROGRESS NOTE  Diana Harper MVH:[id number] DOB: Jul 25, 1936   PCP: Lewis Moccasin, MD  Patient is from: Home  DOA: 04/18/2023 LOS: 1  Chief complaints Chief Complaint  Patient presents with   Loss of Consciousness     Brief Narrative / Interim history: 87 year old F with PMH of DM-2, neuropathy, hypothyroidism, CKD-3B, vasovagal syncope, C. difficile infection and asthma presented to ED after syncopal episode and seizure-like activity that lasted 2 minutes following bowel movements.  MRI brain showed right caudate infarct along with scattered small right MCA stroke.  Neurology consulted. MRA brain without contrast revealed severe stenosis of the midportion of the left superior cerebral artery.  Bilateral carotid ultrasound and TTE without significant finding.  EEG suggestive of moderate diffuse encephalopathy but no seizure or epileptiform discharge.  A1c 7.8%.  LDL is 163.  Neurology recommended aspirin and Plavix for 21 days followed by aspirin alone, high intensity statin and heart monitor.  Cardiology has been consulted for heart monitor.  Subjective: Seen and examined earlier this morning.  No major events overnight of this morning.  No complaints.  She is awake and alert.  She is oriented to self, place and person but not time.  Objective: Vitals:   04/20/23 0534 04/20/23 0818 04/20/23 1132 04/20/23 1553  BP: (!) 186/119 (!) 208/80 (!) 129/110 (!) 172/89  Pulse: 72 78 71 72  Resp:  18 18 18   Temp:  98.9 F (37.2 C) 98.9 F (37.2 C) 98.3 F (36.8 C)  TempSrc:  Oral Oral Oral  SpO2:  97% 97% 100%  Height:        Examination:  GENERAL: No apparent distress.  Nontoxic. HEENT: MMM.  Vision and hearing grossly intact.  NECK: Supple.  No apparent JVD.  RESP:  No IWOB.  Fair aeration bilaterally. CVS:  RRR. Heart sounds normal.  ABD/GI/GU: BS+. Abd soft, NTND.  MSK/EXT:  Moves extremities. No apparent deformity. No edema.  SKIN: no apparent skin lesion or  wound NEURO: Awake, alert and oriented appropriately.  No apparent focal neuro deficit other than BLE weakness. PSYCH: Calm. Normal affect.   Procedures:  None  Microbiology summarized: None  Assessment and plan: Principal Problem:   Acute CVA (cerebrovascular accident) [facility]) Active Problems:   CVA (cerebral vascular accident) (HCC)  Acute CVA involving right caudate head and right MCA branch and proximal: Patient presented with what looks like syncope/seizure.  She has BLE weakness.  MRI brain, MRA, carotid CUS, TTE, A1c and LDL as above. -Neurology recommended aspirin and Plavix for 3 weeks followed by Plavix alone -Cardiology consulted for heart monitor as recommended by neurology. -Permissive hypertension -HH PT/OT ordered.  Seizure-like activity/possible syncope: TTE reassuring.  Carries history of vasovagal syncope.  EEG without seizure.  No events on telemetry.  MRI with stroke as above. -Cardiac monitor as above  Uncontrolled IDDM-2 with hyperglycemia: A1c 7.7%. Recent Labs  Lab 04/19/23 2319 04/20/23 0429 04/20/23 0816 04/20/23 1130 04/20/23 1553  GLUCAP 205* 127* 157* 139* 153*  -Continue current insulin regimen   AKI on CKD Stage 3b/metabolic Acidosis: AKI resolved. Recent Labs    09/12/22 1851 09/13/22 0558 09/14/22 0253 09/15/22 0306 09/16/22 0245 11/10/22 1128 12/30/22 1609 04/18/23 1626 04/18/23 1638 04/19/23 0133  BUN 23 16 11 13 20 21  30* 24* 29* 19  CREATININE 1.70* 1.27* 1.25* 1.36* 1.68* 1.44* 1.78* 2.05* 2.10* 1.42*  -Avoid nephrotoxic meds  Chronic HFpEF 60 to 65% with G1 DD.  Appears euvolemic. -Monitor fluid and respiratory status  Hypothyroidism -Continue home Synthroid Generalized weakness/physical deconditioning -PT/OT  Hypomagnesemia -Monitor replenish as appropriate   Body mass index is 23.04 kg/m.           DVT prophylaxis:  enoxaparin (LOVENOX) injection 30 mg Start: 04/19/23 1000  Code Status: Full  code Family Communication: None at bedside Level of care: Telemetry Medical Status is: Observation The patient remains OBS appropriate and will d/c before 2 midnights.   Final disposition: Home with home health Consultants:  Neurology Cardiology  35 minutes with more than 50% spent in reviewing records, counseling patient/family and coordinating care.   Sch Meds:  Scheduled Meds:  aspirin EC  81 mg Oral Daily   cloNIDine  0.1 mg Oral BID   clopidogrel  75 mg Oral Daily   enoxaparin (LOVENOX) injection  30 mg Subcutaneous Daily   insulin aspart  0-9 Units Subcutaneous Q4H   levothyroxine  88 mcg Oral QAC breakfast   lidocaine  1 patch Transdermal Daily   Continuous Infusions: PRN Meds:.hydrALAZINE  Antimicrobials: Anti-infectives (From admission, onward)    None        I have personally reviewed the following labs and images: CBC: Recent Labs  Lab 04/18/23 1626 04/18/23 1638 04/19/23 0133  WBC 6.4  --  6.6  NEUTROABS 3.6  --   --   HGB 10.9* 10.5* 10.3*  HCT 33.9* 31.0* 30.7*  MCV 89.0  --  85.5  PLT 194  --  PLATELET CLUMPS NOTED ON SMEAR, UNABLE TO ESTIMATE   BMP &GFR Recent Labs  Lab 04/18/23 1626 04/18/23 1638 04/19/23 0133  NA 139 141 140  K 3.7 3.6 4.1  CL 104 105 111  CO2 26  --  21*  GLUCOSE 192* 177* 125*  BUN 24* 29* 19  CREATININE 2.05* 2.10* 1.42*  CALCIUM 9.0  --  8.2*  MG  --   --  1.5*  PHOS  --   --  2.8   CrCl cannot be calculated (Unknown ideal weight.). Liver & Pancreas: Recent Labs  Lab 04/18/23 1626  AST 25  ALT 19  ALKPHOS 53  BILITOT 0.4  PROT 6.3*  ALBUMIN 3.1*   No results for input(s): "LIPASE", "AMYLASE" in the last 168 hours. No results for input(s): "AMMONIA" in the last 168 hours. Diabetic: Recent Labs    04/18/23 1625 04/19/23 0133  HGBA1C 7.8* 7.7*   Recent Labs  Lab 04/19/23 2319 04/20/23 0429 04/20/23 0816 04/20/23 1130 04/20/23 1553  GLUCAP 205* 127* 157* 139* 153*   Cardiac  Enzymes: No results for input(s): "CKTOTAL", "CKMB", "CKMBINDEX", "TROPONINI" in the last 168 hours. No results for input(s): "PROBNP" in the last 8760 hours. Coagulation Profile: No results for input(s): "INR", "PROTIME" in the last 168 hours. Thyroid Function Tests: No results for input(s): "TSH", "T4TOTAL", "FREET4", "T3FREE", "THYROIDAB" in the last 72 hours. Lipid Profile: Recent Labs    04/18/23 2221 04/19/23 0133  CHOL 215* 237*  HDL 31* 37*  LDLCALC 136* 119*  TRIG 239* 183*  CHOLHDL 6.9 6.4   Anemia Panel: No results for input(s): "VITAMINB12", "FOLATE", "FERRITIN", "TIBC", "IRON", "RETICCTPCT" in the last 72 hours. Urine analysis:    Component Value Date/Time   COLORURINE YELLOW 12/30/2022 1609   APPEARANCEUR CLEAR 12/30/2022 1609   LABSPEC 1.010 12/30/2022 1609   PHURINE 6.0 12/30/2022 1609   GLUCOSEU NEGATIVE 12/30/2022 1609   HGBUR NEGATIVE 12/30/2022 1609   BILIRUBINUR NEGATIVE 12/30/2022 1609   KETONESUR NEGATIVE 12/30/2022 1609   PROTEINUR 100 (A) 12/30/2022  1609   UROBILINOGEN 0.2 04/15/2012 1217   NITRITE NEGATIVE 12/30/2022 1609   LEUKOCYTESUR TRACE (A) 12/30/2022 1609   Sepsis Labs: Invalid input(s): "PROCALCITONIN", "LACTICIDVEN"  Microbiology: No results found for this or any previous visit (from the past 240 hour(s)).  Radiology Studies: No results found.    Daimen Shovlin T. Kolsen Choe [facility]  If 7PM-7AM, please contact night-coverage [email or web address] 04/20/2023, 5:26 PM

## 2023-04-21 ENCOUNTER — Other Ambulatory Visit (HOSPITAL_COMMUNITY): Payer: Self-pay

## 2023-04-21 DIAGNOSIS — I639 Cerebral infarction, unspecified: Secondary | ICD-10-CM | POA: Diagnosis not present

## 2023-04-21 DIAGNOSIS — I63311 Cerebral infarction due to thrombosis of right middle cerebral artery: Secondary | ICD-10-CM | POA: Diagnosis not present

## 2023-04-21 LAB — GLUCOSE, CAPILLARY
Glucose-Capillary: 150 mg/dL — ABNORMAL HIGH (ref 70–99)
Glucose-Capillary: 161 mg/dL — ABNORMAL HIGH (ref 70–99)
Glucose-Capillary: 173 mg/dL — ABNORMAL HIGH (ref 70–99)
Glucose-Capillary: 181 mg/dL — ABNORMAL HIGH (ref 70–99)

## 2023-04-21 MED ORDER — INSULIN DEGLUDEC 200 UNIT/ML ~~LOC~~ SOPN: 10.0000 [IU] | PEN_INJECTOR | SUBCUTANEOUS | Status: DC

## 2023-04-21 NOTE — Discharge Instructions (Signed)
Dr. Verl Dicker office will call to set up an appointment to see you as an outpatient for a loop recorder.

## 2023-04-21 NOTE — Progress Notes (Signed)
Discharged instructions given , patient discharged, transported to her daughter's car via wheelchair

## 2023-04-21 NOTE — Progress Notes (Signed)
Occupational Therapy Treatment Patient Details Name: Diana Harper MRN: [id number] DOB: [birth year] Today's Date: 04/21/2023   History of present illness Pt is 87 yo female admitted on 04/18/23 after being found convulsing on [facility], then another episode when EMS arrived.  Pt found to have R caudate acute infarct and scattered small R MCA stroke.  Pt with hx including but not limited to  type 2 diabetes, diabetic polyneuropathy, hypothyroidism, CKD 3B, history of C. difficile infection, asthma   OT comments  Pt with observed and self-endorsed lethargy and confusion this AM. Pt A&Ox1, inconsistent command following/initiation and attention deficits noted. Unable to progress OOB ADLs d/t presentation though pt able to demo basic UB ADLs w/ Setup to Min A. Pending family ability to provide direct assist/supervision with ADLs/mobility, will continue to aim to DC with [facility] therapy services.   Recommendations for follow up therapy are one component of a multi-disciplinary discharge planning process, led by the attending physician.  Recommendations Harper be updated based on patient status, additional functional criteria and insurance authorization.    Assistance Recommended at Discharge Frequent or constant Supervision/Assistance  Patient can return home with the following  A little help with walking and/or transfers;A lot of help with bathing/dressing/bathroom;Assistance with cooking/housework;Direct supervision/assist for medications management;Direct supervision/assist for financial management;Assist for transportation;Help with stairs or ramp for entrance (direct physical assistance with ADL and mobility)   Equipment Recommendations  None recommended by OT    Recommendations for Other Services      Precautions / Restrictions Precautions Precautions: Fall Restrictions Weight Bearing Restrictions: No       Mobility Bed Mobility Overal bed mobility: Needs Assistance             General bed  mobility comments: attempted EOB with pt opting to exit on R side, able to  use handheld assist to lift trunk and initially move LE but then became distracted/lethargic and did not engage in this task further. Max multimodal cues for bridging/scooting up in bed needed    Transfers                         Balance                                           ADL either performed or assessed with clinical judgement   ADL Overall ADL's : Needs assistance/impaired Eating/Feeding: Set up;Bed level Eating/Feeding Details (indicate cue type and reason): assist to open containers, able to bring spoon to mouth without issue Grooming: Minimal assistance;Bed level;Wash/dry face Grooming Details (indicate cue type and reason): cues to initiate/open eyes and reach for washcloth as pt reported "yes" when asked if she wanted to wash her face                               General ADL Comments: Difficult to engage d/t lethargy and confusion in which pt is aware of this and endorses same concern but unable to effectively engage in OOB tasks/ADLs due to this.    Extremity/Trunk Assessment Upper Extremity Assessment Upper Extremity Assessment: Generalized weakness   Lower Extremity Assessment Lower Extremity Assessment: Defer to PT evaluation        Vision   Vision Assessment?: No apparent visual deficits   Perception     Praxis  Cognition Arousal/Alertness: Awake/alert, Lethargic Behavior During Therapy: Flat affect Overall Cognitive Status: No family/caregiver present to determine baseline cognitive functioning                                 General Comments: Pt with flat affect, lethargic. waxiing/waning of attention and agreeable for various activities though inconsistent initiation/attention to given task. Pt unable to name hospital, reports month as June and year as 54. later asked pt what type of place we were in but pt was then  unable to verbalize this. inconsistent following of commands        Exercises      Shoulder Instructions       General Comments Discussed with RN pt A&OX1, lethargy and relayed pt still asking where daughter is    Pertinent Vitals/ Pain       Pain Assessment Pain Assessment: Faces Faces Pain Scale: No hurt Pain Intervention(s): Monitored during session  Home Living                                          Prior Functioning/Environment              Frequency  Min 2X/week        Progress Toward Goals  OT Goals(current goals can now be found in the care plan section)  Progress towards OT goals: OT to reassess next treatment;Not progressing toward goals - comment (limited by lethargy and AMS)  Acute Rehab OT Goals Patient Stated Goal: "i want to go home" OT Goal Formulation: With patient/family Time For Goal Achievement: 05/03/23 Potential to Achieve Goals: Good ADL Goals Pt Will Perform Lower Body Bathing: with min assist;with caregiver independent in assisting;sit to/from stand Pt Will Transfer to Toilet: with min guard assist;ambulating Pt Will Perform Toileting - Clothing Manipulation and hygiene: with caregiver independent in assisting;with min guard assist Additional ADL Goal #1: Pt/caregiver will verbalize 3 strategies to reduce risk of falls  Plan Discharge plan remains appropriate    Co-evaluation                 AM-PAC OT "6 Clicks" Daily Activity     Outcome Measure   Help from another person eating meals?: A Little Help from another person taking care of personal grooming?: A Little Help from another person toileting, which includes using toliet, bedpan, or urinal?: A Lot Help from another person bathing (including washing, rinsing, drying)?: A Lot Help from another person to put on and taking off regular upper body clothing?: A Little Help from another person to put on and taking off regular lower body clothing?: A  Lot 6 Click Score: 15    End of Session    OT Visit Diagnosis: Unsteadiness on feet (R26.81);Other abnormalities of gait and mobility (R26.89);Repeated falls (R29.6);Muscle weakness (generalized) (M62.81);Other symptoms and signs involving cognitive function   Activity Tolerance Patient limited by lethargy;Patient limited by fatigue   Patient Left in bed;with call bell/phone within reach;with bed alarm set   Nurse Communication Mobility status        Time: 0981-1914 OT Time Calculation (min): 25 min  Charges: OT General Charges $OT Visit: 1 Visit OT Treatments $Self Care/Home Management : 8-22 mins $Therapeutic Activity: 8-22 mins  Bradd Canary, OTR/L Acute Rehab Services Office: [phone]   Lorre Munroe 04/21/2023, 1:13  PM

## 2023-04-21 NOTE — Progress Notes (Signed)
Physical Therapy Treatment Patient Details Name: Diana Harper MRN: [id number] DOB: 04-Jan-1936 Today's Date: 04/21/2023   History of Present Illness Pt is 87 yo female admitted on 04/18/23 after being found convulsing on [facility], then another episode when EMS arrived.  Pt found to have R caudate acute infarct and scattered small R MCA stroke.  Pt with hx including but not limited to  type 2 diabetes, diabetic polyneuropathy, hypothyroidism, CKD 3B, history of C. difficile infection, asthma    PT Comments    Pt received in bed and had not been out of bed yet this AM. Pt with more notable balance and speech deficits which she reports has been the norm for her in the mornings the past few weeks. Pt needed increased time coming to EOB and had difficulty sequencing the task.  Mod A needed for first sit>stand but then min A for subsequent stands. Pt ambulated 8' and needed to sit and then another 70' before needing to sit again. Limited by LE fatigue/ cramping as well as 3/4 DOE. SPO2 99% on RA with HR 100 bpm. Pt relays that some of her falls have been when stepping bkwds so worked on this with RW and maintaining proximity to RW with bkwd stepping. Recommend assist for all mobility at d/c. PT will continue to follow.   Recommendations for follow up therapy are one component of a multi-disciplinary discharge planning process, led by the attending physician.  Recommendations may be updated based on patient status, additional functional criteria and insurance authorization.  Follow Up Recommendations       Assistance Recommended at Discharge Frequent or constant Supervision/Assistance  Patient can return home with the following A little help with walking and/or transfers;A little help with bathing/dressing/bathroom;Assistance with cooking/housework;Help with stairs or ramp for entrance   Equipment Recommendations  None recommended by PT    Recommendations for Other Services       Precautions /  Restrictions Precautions Precautions: Fall Restrictions Weight Bearing Restrictions: No     Mobility  Bed Mobility Overal bed mobility: Needs Assistance Bed Mobility: Supine to Sit, Sit to Supine     Supine to sit: Min guard Sit to supine: Min guard   General bed mobility comments: bed placed into flat position and one bed rail left up as pt reports she has one at home. Had difficulty removing covers and problem solving scooting to edge. Took several rest breaks during move from supine to sit. Was ultimately able to come to sitting with vc's and min guard. Pt had an easier time  getting back into bed    Transfers Overall transfer level: Needs assistance Equipment used: Rolling walker (2 wheels) Transfers: Sit to/from Stand Sit to Stand: Min assist, Mod assist           General transfer comment: mod A for first sit>stand, min A for subsequent stands    Ambulation/Gait Ambulation/Gait assistance: Min assist Gait Distance (Feet): 28 Feet (8', 20') Assistive device: Rolling walker (2 wheels) Gait Pattern/deviations: Step-to pattern, Decreased stride length, Drifts right/left, Step-through pattern Gait velocity: decreased Gait velocity interpretation: <1.31 ft/sec, indicative of household ambulator   General Gait Details: pt first ambulated only 8' before needing to sit and reporting that her legs feel like they're giving out under her. After seated rest was able to tolerate 20' more but overall distance was limited by SOB. SPO2 99%, HR 100 bpm. Pt has difficulty with turning with RW and esp with backing up. Worked on these mvmts  Stairs             Wheelchair Mobility    Modified Rankin (Stroke Patients Only)       Balance Overall balance assessment: Needs assistance, History of Falls Sitting-balance support: No upper extremity supported, Bilateral upper extremity supported Sitting balance-Leahy Scale: Fair Sitting balance - Comments: preferred UE support; at  times leans posteriorly with challenges needing UE support   Standing balance support: Bilateral upper extremity supported Standing balance-Leahy Scale: Poor Standing balance comment: RW and min A at times. Loses balance with stepping bkwds                            Cognition Arousal/Alertness: Awake/alert Behavior During Therapy: WFL for tasks assessed/performed Overall Cognitive Status: History of cognitive impairments - at baseline                                 General Comments: Pt pleasant has word finding diffiulties which she reports are worse in the morning when she forst gets up which pertains to this session        Exercises      General Comments        Pertinent Vitals/Pain Pain Assessment Pain Assessment: Faces Faces Pain Scale: Hurts little more Pain Location: L shoulder with rolling onto it Pain Descriptors / Indicators: Aching, Sore    Home Living                          Prior Function            PT Goals (current goals can now be found in the care plan section) Acute Rehab PT Goals Patient Stated Goal: return home PT Goal Formulation: With patient/family Time For Goal Achievement: 05/03/23 Potential to Achieve Goals: Good Progress towards PT goals: Progressing toward goals    Frequency    Min 4X/week      PT Plan Current plan remains appropriate    Co-evaluation              AM-PAC PT "6 Clicks" Mobility   Outcome Measure  Help needed turning from your back to your side while in a flat bed without using bedrails?: A Little Help needed moving from lying on your back to sitting on the side of a flat bed without using bedrails?: A Little Help needed moving to and from a bed to a chair (including a wheelchair)?: A Little Help needed standing up from a chair using your arms (e.g., wheelchair or bedside chair)?: A Little Help needed to walk in hospital room?: A Little Help needed climbing 3-5 steps  with a railing? : A Lot 6 Click Score: 17    End of Session Equipment Utilized During Treatment: Gait belt Activity Tolerance: Patient tolerated treatment well;Patient limited by fatigue Patient left: with call bell/phone within reach;in bed;with bed alarm set Nurse Communication: Mobility status PT Visit Diagnosis: Other abnormalities of gait and mobility (R26.89);History of falling (Z91.81)     Time: 1914-7829 PT Time Calculation (min) (ACUTE ONLY): 28 min  Charges:  $Gait Training: 8-22 mins $Therapeutic Activity: 8-22 mins                     Lyanne Co, PT  Acute Rehab Services Secure chat preferred Office [phone]    Lawana Chambers Oluwadamilola Rosamond 04/21/2023, 11:56 AM

## 2023-04-21 NOTE — Plan of Care (Signed)

## 2023-04-21 NOTE — Progress Notes (Signed)
PROGRESS NOTE  Diana Harper QMV:[id number] DOB: 20-Feb-1936   PCP: Lewis Moccasin, MD  Patient is from: Home  DOA: 04/18/2023 LOS: 1  Chief complaints Chief Complaint  Patient presents with   Loss of Consciousness     Brief Narrative / Interim history: 87 year old F with PMH of DM-2, neuropathy, hypothyroidism, CKD-3B, vasovagal syncope, C. difficile infection and asthma presented to ED after syncopal episode and seizure-like activity that lasted 2 minutes following bowel movements.  MRI brain showed right caudate infarct along with scattered small right MCA stroke.  Neurology consulted. MRA brain without contrast revealed severe stenosis of the midportion of the left superior cerebral artery.  Bilateral carotid ultrasound and TTE without significant finding.  EEG suggestive of moderate diffuse encephalopathy but no seizure or epileptiform discharge.  A1c 7.8%.  LDL is 163.  Neurology recommended aspirin and Plavix for 21 days followed by aspirin alone, high intensity statin and heart monitor.  [facility] cardiology, Dr. Jacinto Halim to arrange heart monitor outpatient.  Subjective: Seen and examined earlier this morning.  No major events overnight of this morning.  No complaints.  Eager to go home.  Objective: Vitals:   04/21/23 0406 04/21/23 0500 04/21/23 0755 04/21/23 1124  BP: (!) 143/73  130/80 115/60  Pulse: 64  73 74  Resp: 16  17 17   Temp: 98.6 F (37 C)  99 F (37.2 C) 98.9 F (37.2 C)  TempSrc: Oral  Oral Oral  SpO2: 98%  100% 100%  Weight:  64.9 kg    Height:        Examination:  GENERAL: No apparent distress.  Nontoxic. HEENT: MMM.  Vision and hearing grossly intact.  NECK: Supple.  No apparent JVD.  RESP:  No IWOB.  Fair aeration bilaterally. CVS:  RRR. Heart sounds normal.  ABD/GI/GU: BS+. Abd soft, NTND.  MSK/EXT:  Moves extremities. No apparent deformity. No edema.  SKIN: no apparent skin lesion or wound NEURO: Awake, alert and oriented x 4.  Able to tell  time by using calendar on the wall.  No apparent focal neuro deficit other than BLE weakness. PSYCH: Calm. Normal affect.   Procedures:  None  Microbiology summarized: None  Assessment and plan: Principal Problem:   Acute CVA (cerebrovascular accident) [facility]) Active Problems:   CVA (cerebral vascular accident) (HCC)  Acute CVA involving right caudate head and right MCA branch and proximal: Patient presented with what looks like syncope/seizure.  She has BLE weakness.  MRI brain, MRA, carotid CUS, TTE, A1c and LDL as above. -Neurology recommended aspirin and Plavix for 3 weeks followed by Plavix alone [facility] cardiology, Dr. Jacinto Halim to arrange outpatient heart monitor as recommended by neurology. -Permissive hypertension -HH PT/OT ordered.  Seizure-like activity/possible syncope: TTE reassuring.  Carries history of vasovagal syncope.  EEG without seizure.  No events on telemetry.  MRI with stroke as above. -Cardiac monitor as above  Uncontrolled IDDM-2 with hyperglycemia: A1c 7.7%. Recent Labs  Lab 04/20/23 2016 04/21/23 0012 04/21/23 0407 04/21/23 0758 04/21/23 1127  GLUCAP 138* 161* 150* 181* 173*  -Continue home meds.   AKI on CKD Stage 3b/metabolic Acidosis: AKI resolved. Recent Labs    09/12/22 1851 09/13/22 0558 09/14/22 0253 09/15/22 0306 09/16/22 0245 11/10/22 1128 12/30/22 1609 04/18/23 1626 04/18/23 1638 04/19/23 0133  BUN 23 16 11 13 20 21  30* 24* 29* 19  CREATININE 1.70* 1.27* 1.25* 1.36* 1.68* 1.44* 1.78* 2.05* 2.10* 1.42*  -Check renal function in 1 to 2 weeks.  Chronic HFpEF 60 to 65%  with G1 DD.  Appears euvolemic. -Monitor fluid and respiratory status   Hypothyroidism -Continue home Synthroid  Generalized weakness/physical deconditioning -PT/OT  Hypomagnesemia: Resolved.   Body mass index is 23.81 kg/m.           DVT prophylaxis:  enoxaparin (LOVENOX) injection 30 mg Start: 04/19/23 1000  Code Status: Full code Family  Communication: None at bedside Level of care: Telemetry Medical Status is: Observation The patient remains OBS appropriate and will d/c before 2 midnights.   Final disposition: Home with home health Consultants:  Neurology Cardiology  25 minutes with more than 50% spent in reviewing records, counseling patient/family and coordinating care.   Sch Meds:  Scheduled Meds:  aspirin EC  81 mg Oral Daily   cloNIDine  0.1 mg Oral BID   clopidogrel  75 mg Oral Daily   enoxaparin (LOVENOX) injection  30 mg Subcutaneous Daily   insulin aspart  0-9 Units Subcutaneous Q4H   levothyroxine  88 mcg Oral QAC breakfast   Continuous Infusions: PRN Meds:.hydrALAZINE  Antimicrobials: Anti-infectives (From admission, onward)    None        I have personally reviewed the following labs and images: CBC: Recent Labs  Lab 04/18/23 1626 04/18/23 1638 04/19/23 0133  WBC 6.4  --  6.6  NEUTROABS 3.6  --   --   HGB 10.9* 10.5* 10.3*  HCT 33.9* 31.0* 30.7*  MCV 89.0  --  85.5  PLT 194  --  PLATELET CLUMPS NOTED ON SMEAR, UNABLE TO ESTIMATE   BMP &GFR Recent Labs  Lab 04/18/23 1626 04/18/23 1638 04/19/23 0133  NA 139 141 140  K 3.7 3.6 4.1  CL 104 105 111  CO2 26  --  21*  GLUCOSE 192* 177* 125*  BUN 24* 29* 19  CREATININE 2.05* 2.10* 1.42*  CALCIUM 9.0  --  8.2*  MG  --   --  1.5*  PHOS  --   --  2.8   Estimated Creatinine Clearance: 26.5 mL/min (A) (by C-G formula based on SCr of 1.42 mg/dL (H)). Liver & Pancreas: Recent Labs  Lab 04/18/23 1626  AST 25  ALT 19  ALKPHOS 53  BILITOT 0.4  PROT 6.3*  ALBUMIN 3.1*   No results for input(s): "LIPASE", "AMYLASE" in the last 168 hours. No results for input(s): "AMMONIA" in the last 168 hours. Diabetic: Recent Labs    04/18/23 1625 04/19/23 0133  HGBA1C 7.8* 7.7*   Recent Labs  Lab 04/20/23 2016 04/21/23 0012 04/21/23 0407 04/21/23 0758 04/21/23 1127  GLUCAP 138* 161* 150* 181* 173*   Cardiac Enzymes: No  results for input(s): "CKTOTAL", "CKMB", "CKMBINDEX", "TROPONINI" in the last 168 hours. No results for input(s): "PROBNP" in the last 8760 hours. Coagulation Profile: No results for input(s): "INR", "PROTIME" in the last 168 hours. Thyroid Function Tests: No results for input(s): "TSH", "T4TOTAL", "FREET4", "T3FREE", "THYROIDAB" in the last 72 hours. Lipid Profile: Recent Labs    04/18/23 2221 04/19/23 0133  CHOL 215* 237*  HDL 31* 37*  LDLCALC 136* 161*  TRIG 239* 183*  CHOLHDL 6.9 6.4   Anemia Panel: No results for input(s): "VITAMINB12", "FOLATE", "FERRITIN", "TIBC", "IRON", "RETICCTPCT" in the last 72 hours. Urine analysis:    Component Value Date/Time   COLORURINE YELLOW 12/30/2022 1609   APPEARANCEUR CLEAR 12/30/2022 1609   LABSPEC 1.010 12/30/2022 1609   PHURINE 6.0 12/30/2022 1609   GLUCOSEU NEGATIVE 12/30/2022 1609   HGBUR NEGATIVE 12/30/2022 1609   BILIRUBINUR NEGATIVE 12/30/2022 1609  KETONESUR NEGATIVE 12/30/2022 1609   PROTEINUR 100 (A) 12/30/2022 1609   UROBILINOGEN 0.2 04/15/2012 1217   NITRITE NEGATIVE 12/30/2022 1609   LEUKOCYTESUR TRACE (A) 12/30/2022 1609   Sepsis Labs: Invalid input(s): "PROCALCITONIN", "LACTICIDVEN"  Microbiology: No results found for this or any previous visit (from the past 240 hour(s)).  Radiology Studies: No results found.    Skyllar Notarianni T. Amberlea Spagnuolo [facility]  If 7PM-7AM, please contact night-coverage [email or web address] 04/21/2023, 12:26 PM

## 2023-04-22 DIAGNOSIS — E1165 Type 2 diabetes mellitus with hyperglycemia: Secondary | ICD-10-CM | POA: Diagnosis not present

## 2023-04-22 DIAGNOSIS — E559 Vitamin D deficiency, unspecified: Secondary | ICD-10-CM | POA: Diagnosis not present

## 2023-04-22 DIAGNOSIS — Z794 Long term (current) use of insulin: Secondary | ICD-10-CM | POA: Diagnosis not present

## 2023-04-22 DIAGNOSIS — I4891 Unspecified atrial fibrillation: Secondary | ICD-10-CM | POA: Diagnosis not present

## 2023-04-22 DIAGNOSIS — Z9842 Cataract extraction status, left eye: Secondary | ICD-10-CM | POA: Diagnosis not present

## 2023-04-22 DIAGNOSIS — H409 Unspecified glaucoma: Secondary | ICD-10-CM | POA: Diagnosis not present

## 2023-04-22 DIAGNOSIS — J45909 Unspecified asthma, uncomplicated: Secondary | ICD-10-CM | POA: Diagnosis not present

## 2023-04-22 DIAGNOSIS — E1122 Type 2 diabetes mellitus with diabetic chronic kidney disease: Secondary | ICD-10-CM | POA: Diagnosis not present

## 2023-04-22 DIAGNOSIS — I69354 Hemiplegia and hemiparesis following cerebral infarction affecting left non-dominant side: Secondary | ICD-10-CM | POA: Diagnosis not present

## 2023-04-22 DIAGNOSIS — I5030 Unspecified diastolic (congestive) heart failure: Secondary | ICD-10-CM | POA: Diagnosis not present

## 2023-04-22 DIAGNOSIS — M1990 Unspecified osteoarthritis, unspecified site: Secondary | ICD-10-CM | POA: Diagnosis not present

## 2023-04-22 DIAGNOSIS — D631 Anemia in chronic kidney disease: Secondary | ICD-10-CM | POA: Diagnosis not present

## 2023-04-22 DIAGNOSIS — Z90710 Acquired absence of both cervix and uterus: Secondary | ICD-10-CM | POA: Diagnosis not present

## 2023-04-22 DIAGNOSIS — Z974 Presence of external hearing-aid: Secondary | ICD-10-CM | POA: Diagnosis not present

## 2023-04-22 DIAGNOSIS — F419 Anxiety disorder, unspecified: Secondary | ICD-10-CM | POA: Diagnosis not present

## 2023-04-22 DIAGNOSIS — I130 Hypertensive heart and chronic kidney disease with heart failure and stage 1 through stage 4 chronic kidney disease, or unspecified chronic kidney disease: Secondary | ICD-10-CM | POA: Diagnosis not present

## 2023-04-22 DIAGNOSIS — E785 Hyperlipidemia, unspecified: Secondary | ICD-10-CM | POA: Diagnosis not present

## 2023-04-22 DIAGNOSIS — Z86010 Personal history of colonic polyps: Secondary | ICD-10-CM | POA: Diagnosis not present

## 2023-04-22 DIAGNOSIS — Z7982 Long term (current) use of aspirin: Secondary | ICD-10-CM | POA: Diagnosis not present

## 2023-04-22 DIAGNOSIS — Z7902 Long term (current) use of antithrombotics/antiplatelets: Secondary | ICD-10-CM | POA: Diagnosis not present

## 2023-04-22 DIAGNOSIS — E1142 Type 2 diabetes mellitus with diabetic polyneuropathy: Secondary | ICD-10-CM | POA: Diagnosis not present

## 2023-04-22 DIAGNOSIS — Z9841 Cataract extraction status, right eye: Secondary | ICD-10-CM | POA: Diagnosis not present

## 2023-04-22 DIAGNOSIS — Z961 Presence of intraocular lens: Secondary | ICD-10-CM | POA: Diagnosis not present

## 2023-04-22 DIAGNOSIS — N184 Chronic kidney disease, stage 4 (severe): Secondary | ICD-10-CM | POA: Diagnosis not present

## 2023-04-22 DIAGNOSIS — E039 Hypothyroidism, unspecified: Secondary | ICD-10-CM | POA: Diagnosis not present

## 2023-04-27 ENCOUNTER — Ambulatory Visit: Payer: Self-pay

## 2023-04-27 DIAGNOSIS — I5030 Unspecified diastolic (congestive) heart failure: Secondary | ICD-10-CM | POA: Diagnosis not present

## 2023-04-27 DIAGNOSIS — I69354 Hemiplegia and hemiparesis following cerebral infarction affecting left non-dominant side: Secondary | ICD-10-CM | POA: Diagnosis not present

## 2023-04-27 DIAGNOSIS — E1165 Type 2 diabetes mellitus with hyperglycemia: Secondary | ICD-10-CM | POA: Diagnosis not present

## 2023-04-27 DIAGNOSIS — E1122 Type 2 diabetes mellitus with diabetic chronic kidney disease: Secondary | ICD-10-CM | POA: Diagnosis not present

## 2023-04-27 DIAGNOSIS — E1142 Type 2 diabetes mellitus with diabetic polyneuropathy: Secondary | ICD-10-CM | POA: Diagnosis not present

## 2023-04-27 DIAGNOSIS — I130 Hypertensive heart and chronic kidney disease with heart failure and stage 1 through stage 4 chronic kidney disease, or unspecified chronic kidney disease: Secondary | ICD-10-CM | POA: Diagnosis not present

## 2023-04-27 NOTE — Patient Instructions (Signed)
Visit Information  Thank you for taking time to visit with me today. Please don't hesitate to contact me if I can be of assistance to you.   Following are the goals we discussed today:   Goals Addressed             This Visit's Progress    RN Care Coordination Activities: further follow up needed       Care Coordination Interventions: Completed successful outbound call with patient and daughter [name] Evaluation of current treatment plan related to acute CVA and patient's adherence to plan as established by provider Discussed and reviewed post discharge recommendations with patient and daughter Recommendations at discharge:  Follow-up with PCP within 1 to 2 weeks repeat CBC, CMP, mag, Phos within 1 week Follow-up with neurology in outpatient setting and continue dual antiplatelet therapy for 3 weeks and then just Plavix alone Follow-up with cardiology Dr. Jacinto Halim in outpatient setting for Holter monitor and evaluation for Leqvio or Repatha Review of patient status, including review of consultant's reports, relevant laboratory and other test results, and medications completed Reviewed scheduled/upcoming provider appointments including: next PCP with Dr. Duanne Guess follow up appointment scheduled for 05/02/23; follow up with Cardiologist, Dr. Jacinto Halim scheduled for 05/04/23 @1 :15 PM Assessed for SDOH barriers, sent SW referral to [facility] requesting assistance with resources for transportation and W/C ramp           Our next appointment is by telephone on 05/06/23 at 1:15 PM  Please call the care guide team at [phone] if you need to cancel or reschedule your appointment.   If you are experiencing a Mental Health or Behavioral Health Crisis or need someone to talk to, please call 1-800-273-TALK (toll free, 24 hour hotline) go to [facility] [address] [place] [phone])  Patient verbalizes understanding of instructions and care plan  provided today and agrees to view in MyChart. Active MyChart status and patient understanding of how to access instructions and care plan via MyChart confirmed with patient.     Delsa Sale, RN, BSN, CCM Care Management Coordinator [facility] Care Management Direct Phone: [phone]

## 2023-04-27 NOTE — Patient Outreach (Signed)
  Care Coordination   04/27/2023 Name: Diana Harper MRN: [id number] DOB: 06/11/1936   Care Coordination Outreach Attempts:  An unsuccessful telephone outreach was attempted for a scheduled appointment today.  Follow Up Plan:  Additional outreach attempts will be made to offer the patient care coordination information and services.   Encounter Outcome:  No Answer   Care Coordination Interventions:  No, not indicated    Delsa Sale, RN, BSN, CCM Care Management Coordinator [facility] Care Management   Direct Phone: [phone]

## 2023-04-27 NOTE — Patient Outreach (Signed)
  Care Coordination   Follow Up Visit Note   04/27/2023 Name: Diana Harper MRN: [id number] DOB: 1936-05-06  Diana Harper is a 87 y.o. year old female who sees Diana Moccasin, MD for primary care. I spoke with  Diana Harper and daughter Diana Harper by phone today.  What matters to the patients health and wellness today?  Patient would like to make a full recovery from her recent CVA.     Goals Addressed             This Visit's Progress    RN Care Coordination Activities: further follow up needed       Care Coordination Interventions: Completed successful outbound call with patient and daughter Diana Harper Evaluation of current treatment plan related to acute CVA and patient's adherence to plan as established by provider Discussed and reviewed post discharge recommendations with patient and daughter Recommendations at discharge:  Follow-up with PCP within 1 to 2 weeks repeat CBC, CMP, mag, Phos within 1 week Follow-up with neurology in outpatient setting and continue dual antiplatelet therapy for 3 weeks and then just Plavix alone Follow-up with cardiology Dr. Jacinto Halim in outpatient setting for Holter monitor and evaluation for Leqvio or Repatha Review of patient status, including review of consultant's reports, relevant laboratory and other test results, and medications completed Reviewed scheduled/upcoming provider appointments including: next PCP with Dr. Duanne Guess follow up appointment scheduled for 05/02/23; follow up with Cardiologist, Dr. Jacinto Halim scheduled for 05/04/23 @1 :15 PM Assessed for SDOH barriers, sent SW referral to [facility] BSW requesting assistance with resources for transportation and W/C ramp    Interventions Today    Flowsheet Row Most Recent Value  Chronic Disease   Chronic disease during today's visit Other  [Acute CVA (cerebrovascular accident)]  General Interventions   General Interventions Discussed/Reviewed General Interventions Discussed, General  Interventions Reviewed, Doctor Visits, Labs, Durable Medical Equipment (DME), Communication with  Doctor Visits Discussed/Reviewed Doctor Visits Discussed, Doctor Visits Reviewed, Specialist, PCP  Durable Medical Equipment (DME) Dan Humphreys, Other  [facility] ramp]  Communication with Social Work  Tree surgeon w/transportation and W/C ramp]  Education Interventions   Education Provided Provided Education  Provided Engineer, petroleum On Labs, Exercise, Medication  Pharmacy Interventions   Pharmacy Dicussed/Reviewed Pharmacy Topics Discussed, Pharmacy Topics Reviewed, Medications and their functions, Medication Adherence  Medication Adherence Not taking medication  [not taking Advair, PCP aware]  Safety Interventions   Safety Discussed/Reviewed Safety Discussed, Safety Reviewed, Fall Risk, Home Safety  Home Safety Assistive Devices          SDOH assessments and interventions completed:  No     Care Coordination Interventions:  Yes, provided   Follow up plan: Referral made to SW [facility] B[facility]  Follow up call scheduled for 05/06/23 @1 :15 PM    Encounter Outcome:  Pt. Visit Completed

## 2023-04-28 ENCOUNTER — Telehealth: Payer: Self-pay

## 2023-04-28 DIAGNOSIS — E039 Hypothyroidism, unspecified: Secondary | ICD-10-CM | POA: Diagnosis not present

## 2023-04-28 DIAGNOSIS — E119 Type 2 diabetes mellitus without complications: Secondary | ICD-10-CM | POA: Diagnosis not present

## 2023-04-28 NOTE — Patient Outreach (Signed)
  Care Coordination   04/28/2023 Name: Tayler Neeman MRN: [id number] DOB: January 19, 1936   Care Coordination Outreach Attempts:  SW placed an unsuccessful call to the patients daughter to assist with resources needs related to transportation and a ramp. HIPAA compliant voice message left requesting a return call.  Follow Up Plan:  Additional outreach attempts will be made to offer the patient care coordination information and services.   Encounter Outcome:  No Answer   Care Coordination Interventions:  No, not indicated    Bevelyn Ngo, BSW, CDP Social Worker, Certified Dementia Practitioner [facility] Care Management  Care Coordination [phone]

## 2023-04-29 ENCOUNTER — Telehealth: Payer: Self-pay

## 2023-04-29 DIAGNOSIS — E1122 Type 2 diabetes mellitus with diabetic chronic kidney disease: Secondary | ICD-10-CM | POA: Diagnosis not present

## 2023-04-29 DIAGNOSIS — E1165 Type 2 diabetes mellitus with hyperglycemia: Secondary | ICD-10-CM | POA: Diagnosis not present

## 2023-04-29 DIAGNOSIS — I5030 Unspecified diastolic (congestive) heart failure: Secondary | ICD-10-CM | POA: Diagnosis not present

## 2023-04-29 DIAGNOSIS — E1142 Type 2 diabetes mellitus with diabetic polyneuropathy: Secondary | ICD-10-CM | POA: Diagnosis not present

## 2023-04-29 DIAGNOSIS — I130 Hypertensive heart and chronic kidney disease with heart failure and stage 1 through stage 4 chronic kidney disease, or unspecified chronic kidney disease: Secondary | ICD-10-CM | POA: Diagnosis not present

## 2023-04-29 DIAGNOSIS — I69354 Hemiplegia and hemiparesis following cerebral infarction affecting left non-dominant side: Secondary | ICD-10-CM | POA: Diagnosis not present

## 2023-04-29 NOTE — Patient Outreach (Signed)
  Care Coordination   04/29/2023 Name: Sharilee Loupe MRN: [id number] DOB: [birth year]   Care Coordination Outreach Attempts:  SW placed an outbound call to the patients daughter, Marti Sleigh, to assist with care coordination needs. [name] indicated she is unable to take the call at this time and will call SW back later today.  Follow Up Plan:  Additional outreach attempts will be made to offer the patient care coordination information and services.   Encounter Outcome:  Pt. Request to Call Back   Care Coordination Interventions:  No, not indicated    Bevelyn Ngo, BSW, CDP Social Worker, Certified Dementia Practitioner [facility] Care Management  Care Coordination [phone]

## 2023-05-02 DIAGNOSIS — E1159 Type 2 diabetes mellitus with other circulatory complications: Secondary | ICD-10-CM | POA: Diagnosis not present

## 2023-05-02 DIAGNOSIS — E1121 Type 2 diabetes mellitus with diabetic nephropathy: Secondary | ICD-10-CM | POA: Diagnosis not present

## 2023-05-02 DIAGNOSIS — I639 Cerebral infarction, unspecified: Secondary | ICD-10-CM | POA: Diagnosis not present

## 2023-05-02 DIAGNOSIS — I152 Hypertension secondary to endocrine disorders: Secondary | ICD-10-CM | POA: Diagnosis not present

## 2023-05-02 DIAGNOSIS — I10 Essential (primary) hypertension: Secondary | ICD-10-CM | POA: Diagnosis not present

## 2023-05-02 DIAGNOSIS — J45909 Unspecified asthma, uncomplicated: Secondary | ICD-10-CM | POA: Diagnosis not present

## 2023-05-02 DIAGNOSIS — E1165 Type 2 diabetes mellitus with hyperglycemia: Secondary | ICD-10-CM | POA: Diagnosis not present

## 2023-05-02 DIAGNOSIS — E039 Hypothyroidism, unspecified: Secondary | ICD-10-CM | POA: Diagnosis not present

## 2023-05-04 ENCOUNTER — Ambulatory Visit: Payer: Medicare Other | Admitting: Cardiology

## 2023-05-04 ENCOUNTER — Encounter: Payer: Self-pay | Admitting: Cardiology

## 2023-05-04 ENCOUNTER — Other Ambulatory Visit: Payer: Self-pay | Admitting: Cardiology

## 2023-05-04 VITALS — BP 120/71 | HR 69 | Resp 16 | Ht 65.0 in | Wt 138.0 lb

## 2023-05-04 DIAGNOSIS — E7800 Pure hypercholesterolemia, unspecified: Secondary | ICD-10-CM

## 2023-05-04 DIAGNOSIS — I10 Essential (primary) hypertension: Secondary | ICD-10-CM

## 2023-05-04 DIAGNOSIS — I63411 Cerebral infarction due to embolism of right middle cerebral artery: Secondary | ICD-10-CM

## 2023-05-04 MED ORDER — ATORVASTATIN CALCIUM 40 MG PO TABS
40.0000 mg | ORAL_TABLET | Freq: Every day | ORAL | 1 refills | Status: DC
Start: 2023-05-04 — End: 2023-05-04

## 2023-05-04 NOTE — Patient Instructions (Signed)
I discussed with the patient regarding options of therapy.  However embolic stroke is most probably related to either aortic atherosclerosis and atheroemboli versus PAF leading to cardiac source of cerebral emboli.  Options would be to place a loop recorder, control risk factors including hypertension, hypercholesterolemia and diabetes mellitus.  Other option if she does not want a loop recorder would be to do a TEE to see for cardiac source of cerebral emboli.  If no etiology found, then she may need a loop recorder.  Patient would like to think about both this and then make decision.

## 2023-05-04 NOTE — Progress Notes (Signed)
Primary Physician/Referring:  Lewis Moccasin, MD  Patient ID: Diana Harper, female    DOB: [birth year], 87 y.o.   MRN: [id number]  Chief Complaint  Patient presents with   Transient Ischemic Attack   Follow-up    2-3 week   HPI:    Diana Harper  is a 87 y.o. African-American female patient with diabetes mellitus, diabetic polyneuropathy, stage IV chronic kidney disease, history of TIA in 2019, admitted to the hospital on 04/18/2023 with acute right caudate head stroke and scattered small foci of acute ischemic stroke within the right MCA territory and also found to have old posterior left parietal lobe infarct by imaging.  She now presents for follow-up to reestablish cardiac care, I had last seen her in 2020.  Patient has generalized weakness, previously was able to walk with help of walker for at least 50 to 60 feet, now is able to walk very minimal, has been getting home PT and OT.  She is pretty much on a wheelchair or on the walker, gets weak walking a few feet.  She needs help including help for activities of daily living.  She has not had any recurrence of strokelike symptoms.  Daughter present.  Past Medical History:  Diagnosis Date   Anemia    Anxiety    Arthritis    Asthma    Biliary colic    C. difficile colitis 08/2022   Cholelithiasis    CKD (chronic kidney disease), stage III [facility])    nephrologist--  dr Reynolds Bowl   Colon polyps    Diverticulosis of colon    Gastric ulcer    Glaucoma    History of asthma    1980's  no longer problem since 1980's   History of diverticulitis of colon    recurrent--  2014;  2013;  2012   Hyperlipidemia    Hypertension    Hypothyroidism    Insulin dependent type 2 diabetes mellitus (HCC)    followed by dr Jonny Ruiz lambeth ([facility])    Murmur    Peripheral neuropathy    Stroke [facility])    Vitamin D deficiency    Wears hearing aid    bilateral   Wears partial dentures    lower partial and upper full   Past Surgical History:   Procedure Laterality Date   ABDOMINAL HYSTERECTOMY  1970's   CARDIOVASCULAR STRESS TEST  07/30/2009   normal exercise lexiscan nuclear study w/ no ischemia/  normal LV funciton and wall motion, ef 77%   CATARACT EXTRACTION W/ INTRAOCULAR LENS  IMPLANT, BILATERAL  2014   LAPAROSCOPIC CHOLECYSTECTOMY SINGLE SITE WITH INTRAOPERATIVE CHOLANGIOGRAM N/A 08/25/2016   Procedure: LAPAROSCOPIC CHOLECYSTECTOMY WITH INTRAOPERATIVE CHOLANGIOGRAM;  Surgeon: Rodman Pickle, MD;  Location: [facility];  Service: General;  Laterality: N/A;   LUMBAR SPINE SURGERY  2014   REMOVAL AXILLA CYST Right 1990's   Family History  Problem Relation Age of Onset   Stroke Mother    Colon polyps Mother    Diabetes Mother    Diabetes Father    Kidney failure Father    COPD Father    Colon polyps Sister    Diabetes Sister    Stroke Brother    Diabetes Brother    Heart attack Maternal Grandmother    Heart disease Maternal Grandfather    Diabetes Paternal Grandmother    Stroke Paternal Grandfather    Hypertension Paternal Grandfather    Diabetes Sister    Diabetes Sister    Diabetes  Son    Diabetes Daughter    Colon cancer Neg Hx    Esophageal cancer Neg Hx    Inflammatory bowel disease Neg Hx    Liver disease Neg Hx    Pancreatic cancer Neg Hx    Rectal cancer Neg Hx    Stomach cancer Neg Hx     Social History   Tobacco Use   Smoking status: Never   Smokeless tobacco: Never  Substance Use Topics   Alcohol use: No   Marital Status: Widowed  ROS  Review of Systems  Cardiovascular:  Negative for chest pain, dyspnea on exertion and leg swelling.  Neurological:  Positive for difficulty with concentration.   Objective      05/04/2023    1:25 PM 04/21/2023   11:24 AM 04/21/2023    7:55 AM  Vitals with BMI  Height 5\' 5"     Weight 138 lbs    BMI 22.96    Systolic 120 115 161  Diastolic 71 60 80  Pulse 69 74 73   Blood pressure 120/71, pulse 69, resp. rate 16, height 5\' 5"   (1.651 m), weight 138 lb (62.6 kg), SpO2 94 %.  Physical Exam Neck:     Vascular: Carotid bruit (bilateral) present. No JVD.  Cardiovascular:     Rate and Rhythm: Normal rate and regular rhythm.     Pulses: Intact distal pulses.     Heart sounds: Normal heart sounds. No murmur heard.    No gallop.  Pulmonary:     Effort: Pulmonary effort is normal.     Breath sounds: Normal breath sounds.  Abdominal:     General: Bowel sounds are normal.     Palpations: Abdomen is soft.  Musculoskeletal:     Right lower leg: No edema.     Left lower leg: No edema.    Laboratory examination:   Recent Labs    12/30/22 1609 04/18/23 1626 04/18/23 1638 04/19/23 0133  NA 140 139 141 140  K 3.6 3.7 3.6 4.1  CL 103 104 105 111  CO2 27 26  --  21*  GLUCOSE 221* 192* 177* 125*  BUN 30* 24* 29* 19  CREATININE 1.78* 2.05* 2.10* 1.42*  CALCIUM 9.1 9.0  --  8.2*  GFRNONAA 28* 23*  --  36*    Lab Results  Component Value Date   GLUCOSE 125 (H) 04/19/2023   NA 140 04/19/2023   K 4.1 04/19/2023   CL 111 04/19/2023   CO2 21 (L) 04/19/2023   BUN 19 04/19/2023   CREATININE 1.42 (H) 04/19/2023   GFRNONAA 36 (L) 04/19/2023   CALCIUM 8.2 (L) 04/19/2023   PHOS 2.8 04/19/2023   PROT 6.3 (L) 04/18/2023   ALBUMIN 3.1 (L) 04/18/2023   BILITOT 0.4 04/18/2023   ALKPHOS 53 04/18/2023   AST 25 04/18/2023   ALT 19 04/18/2023   ANIONGAP 8 04/19/2023      Lab Results  Component Value Date   ALT 19 04/18/2023   AST 25 04/18/2023   ALKPHOS 53 04/18/2023   BILITOT 0.4 04/18/2023       Latest Ref Rng & Units 04/18/2023    4:26 PM 12/30/2022    4:09 PM 11/10/2022   11:28 AM  Hepatic Function  Total Protein 6.5 - 8.1 g/dL 6.3  7.4  6.7   Albumin 3.5 - 5.0 g/dL 3.1  3.5  3.3   AST 15 - 41 U/L 25  20  24    ALT 0 - 44  U/L 19  16  28    Alk Phosphatase 38 - 126 U/L 53  53  53   Total Bilirubin 0.3 - 1.2 mg/dL 0.4  0.6  0.4     Lipid Panel Recent Labs    04/18/23 2221 04/19/23 0133  CHOL 215*  237*  TRIG 239* 183*  LDLCALC 136* 163*  VLDL 48* 37  HDL 31* 37*  CHOLHDL 6.9 6.4    HEMOGLOBIN Z6X Lab Results  Component Value Date   HGBA1C 7.7 (H) 04/19/2023   MPG 174.29 04/19/2023   Lab Results  Component Value Date   TSH 2.67 02/04/2021     Radiology:   MRA head without contrast 04/18/2023:  Severe stenosis of the midportion of the left superior cerebellar artery. Otherwise normal intracranial MRA.  Cardiac Studies:   PCV MYOCARDIAL PERFUSION WITH LEXISCAN 01/21/2021 Lexiscan nuclear stress test performed using 1-day protocol. Normal myocardial perfusion. Stress LVEF 56%. Low risk study.  Event monitor 10/20/2018-11/18/2018 (no tracings available for review): Sinus rhythm with heart rate of 60 bpm.  With no evidence of atrial fibrillation, or other significant cardiac arrhythmias.  Carotid artery duplex 04/21/2023: Right Carotid: The extracranial vessels were near-normal with only minimal wall thickening or plaque. Left Carotid: The extracranial vessels were near-normal with only minimal wall thickening or plaque. Vertebrals:  Bilateral vertebral arteries demonstrate antegrade flow. Subclavians: Normal flow hemodynamics were seen in bilateral subclavian arteries.  Echocardiogram 04/18/2023:  1. Left ventricular ejection fraction, by estimation, is 60 to 65%. The left ventricle has normal function. The left ventricle has no regional wall motion abnormalities. Left ventricular diastolic parameters are consistent with Grade I diastolic  dysfunction (impaired relaxation). Elevated left atrial pressure.  2. Right ventricular systolic function is normal. The right ventricular size is normal.  3. The mitral valve is normal in structure. No evidence of mitral valve regurgitation. No evidence of mitral stenosis.  4. The aortic valve is tricuspid. There is moderate calcification of the aortic valve. Aortic valve regurgitation is not visualized. No aortic stenosis is  present.  5. The inferior vena cava is normal in size with greater than 50% respiratory variability, suggesting right atrial pressure of 3 mmHg.  EKG:   EKG 04/18/2023: Normal sinus rhythm at rate of 64 bpm, normal axis, poor R progression, probably normal variant. No evidence of ischemia.   Medications and allergies   Allergies  Allergen Reactions   Latex Anaphylaxis, Shortness Of Breath and Other (See Comments)    Severe respiratory distress   Onion Diarrhea   Other Other (See Comments)    Patient cannot have TREE NUTS and anything with seeds- History of  diverticulitis    Flexeril [Cyclobenzaprine] Other (See Comments)    Caused agitation   Lisinopril Cough   Metformin And Related Diarrhea   Simvastatin Hives and Rash   Tetracycline Rash   Zithromax [Azithromycin] Rash     Medication list   Current Outpatient Medications:    acetaminophen (TYLENOL) 500 MG tablet, Take 1,000 mg by mouth every 8 (eight) hours as needed for mild pain, headache or fever., Disp: , Rfl:    albuterol (VENTOLIN HFA) 108 (90 Base) MCG/ACT inhaler, Inhale 2 puffs into the lungs every 4 (four) hours as needed for wheezing or shortness of breath., Disp: , Rfl:    aspirin EC 81 MG tablet, Take 1 tablet (81 mg total) by mouth daily for 21 days., Disp: 21 tablet, Rfl: 0   atorvastatin (LIPITOR) 40 MG tablet, Take 1 tablet (40  mg total) by mouth daily., Disp: 90 tablet, Rfl: 1   carvedilol (COREG) 6.25 MG tablet, Take 6.25 mg by mouth 2 (two) times daily with a meal., Disp: , Rfl:    Cholecalciferol (VITAMIN D3) 25 MCG (1000 UT) CAPS, Take 1,000 Units by mouth daily., Disp: , Rfl:    clopidogrel (PLAVIX) 75 MG tablet, Take 1 tablet (75 mg total) by mouth daily., Disp: 30 tablet, Rfl: 0   diclofenac Sodium (VOLTAREN) 1 % GEL, Apply 2 g topically as needed., Disp: , Rfl:    diclofenac Sodium (VOLTAREN) 1 % GEL, Apply 4 g topically as needed., Disp: , Rfl:    Glucagon HCl 1 MG SOLR, Inject 1 mg into the skin as  needed (for a blood sugar <67)., Disp: , Rfl:    insulin degludec (TRESIBA) 200 UNIT/ML FlexTouch Pen, Inject 10 Units into the skin See admin instructions. (Patient taking differently: Inject 15 Units into the skin See admin instructions. On 6/10 will go up to 20 units), Disp: , Rfl:    levothyroxine (SYNTHROID) 88 MCG tablet, Take 88 mcg by mouth daily before breakfast., Disp: , Rfl:    nitroGLYCERIN (NITROSTAT) 0.4 MG SL tablet, Place 1 tablet (0.4 mg total) under the tongue every 5 (five) minutes as needed for chest pain., Disp: 30 tablet, Rfl: 3   NOVOLOG FLEXPEN 100 UNIT/ML FlexPen, Inject 5-10 Units into the skin See admin instructions. Inject 5 units into the skin before meals if BGL is 100-150, 7 units if BGL is 151-200, and 10 units if BGL is greater than 200, Disp: , Rfl:    sertraline (ZOLOFT) 50 MG tablet, Take 150 mg by mouth at bedtime., Disp: , Rfl:    thiamine (VITAMIN B1) 100 MG tablet, Take 1 tablet (100 mg total) by mouth daily., Disp: 30 tablet, Rfl: 0   Continuous Blood Gluc Receiver (FREESTYLE LIBRE 2 READER) DEVI, See admin instructions., Disp: , Rfl:    Continuous Blood Gluc Sensor (FREESTYLE LIBRE 14 DAY SENSOR) MISC, Inject 1 Device into the skin every 14 (fourteen) days., Disp: , Rfl:   Assessment     ICD-10-CM   1. Cerebrovascular accident (CVA) due to embolism of right middle cerebral artery (HCC)  I63.411 atorvastatin (LIPITOR) 40 MG tablet    2. Hypercholesteremia  E78.00 atorvastatin (LIPITOR) 40 MG tablet    3. Primary hypertension  I10        No orders of the defined types were placed in this encounter.   Meds ordered this encounter  Medications   atorvastatin (LIPITOR) 40 MG tablet    Sig: Take 1 tablet (40 mg total) by mouth daily.    Dispense:  90 tablet    Refill:  1    Medications Discontinued During This Encounter  Medication Reason   ADVAIR HFA 230-21 MCG/ACT inhaler    Calcium Carbonate (CALTRATE 600 PO)    folic acid (FOLVITE) 1 MG tablet       Recommendations:   Jaleeza Shimp is a 87 y.o. African-American female patient with diabetes mellitus, diabetic polyneuropathy, stage IV chronic kidney disease, history of TIA in 2019, admitted to the hospital on 04/18/2023 with acute right caudate head stroke and scattered small foci of acute ischemic stroke within the right MCA territory and also found to have old posterior left parietal lobe infarct by imaging.  She now presents for follow-up to reestablish cardiac care, I had last seen her in 2020.  1. Cerebrovascular accident (CVA) due to embolism of right middle  cerebral artery (HCC) I discussed with the patient regarding options of therapy.  However embolic stroke is most probably related to either aortic atherosclerosis and atheroemboli versus PAF leading to cardiac source of cerebral emboli.  Options would be to place a loop recorder, control risk factors including hypertension, hypercholesterolemia and diabetes mellitus.  Other option if she does not want a loop recorder would be to do a TEE to see for cardiac source of cerebral emboli.  If no etiology found, then she may need a loop recorder.  Patient would like to think about both this and then make decision.  For now continue aspirin for a total of 30 days followed by Plavix indefinitely.  - atorvastatin (LIPITOR) 40 MG tablet; Take 1 tablet (40 mg total) by mouth daily.  Dispense: 90 tablet; Refill: 1  2. Hypercholesteremia Patient was not discharged on a statin.  Her LDL is not at goal.  I have started her on Lipitor 40 mg daily.  She will need lipid profile testing which can be performed by Dr. To be anywhere from 6 to 8 weeks or 3 months from now.  - atorvastatin (LIPITOR) 40 MG tablet; Take 1 tablet (40 mg total) by mouth daily.  Dispense: 90 tablet; Refill: 1  3. Primary hypertension Since having made medication changes, blood pressure in excellent control.  Continue present medications.  If she has any event or if  she would want to proceed with the procedure, certainly will set this up as well.  Patient's daughter present.  Other orders - albuterol (VENTOLIN HFA) 108 (90 Base) MCG/ACT inhaler; Inhale 2 puffs into the lungs every 4 (four) hours as needed for wheezing or shortness of breath. - diclofenac Sodium (VOLTAREN) 1 % GEL; Apply 2 g topically as needed. - diclofenac Sodium (VOLTAREN) 1 % GEL; Apply 4 g topically as needed.    Yates Decamp, MD, [facility] 05/04/2023, 2:12 PM Office: [phone]

## 2023-05-05 DIAGNOSIS — E1142 Type 2 diabetes mellitus with diabetic polyneuropathy: Secondary | ICD-10-CM | POA: Diagnosis not present

## 2023-05-05 DIAGNOSIS — I5030 Unspecified diastolic (congestive) heart failure: Secondary | ICD-10-CM | POA: Diagnosis not present

## 2023-05-05 DIAGNOSIS — I69354 Hemiplegia and hemiparesis following cerebral infarction affecting left non-dominant side: Secondary | ICD-10-CM | POA: Diagnosis not present

## 2023-05-05 DIAGNOSIS — E1122 Type 2 diabetes mellitus with diabetic chronic kidney disease: Secondary | ICD-10-CM | POA: Diagnosis not present

## 2023-05-05 DIAGNOSIS — I130 Hypertensive heart and chronic kidney disease with heart failure and stage 1 through stage 4 chronic kidney disease, or unspecified chronic kidney disease: Secondary | ICD-10-CM | POA: Diagnosis not present

## 2023-05-05 DIAGNOSIS — E1165 Type 2 diabetes mellitus with hyperglycemia: Secondary | ICD-10-CM | POA: Diagnosis not present

## 2023-05-06 ENCOUNTER — Ambulatory Visit: Payer: Self-pay

## 2023-05-06 NOTE — Patient Instructions (Signed)
Visit Information  Thank you for taking time to visit with me today. Please don't hesitate to contact me if I can be of assistance to you.   Following are the goals we discussed today:   Goals Addressed             This Visit's Progress    RN Care Coordination Activities: further follow up needed       Care Coordination Interventions: Completed successful outbound call with patient and daughter Diana Harper Evaluation of current treatment plan related to acute CVA and patient's adherence to plan as established by provider Discussed and reviewed with daughter Diana Harper post hospital follow up with Dr. Jacinto Halim I discussed with the patient regarding options of therapy. However embolic stroke is most probably related to either aortic atherosclerosis and atheroemboli versus PAF leading to cardiac source of cerebral emboli. Options would be to place a loop recorder, control risk factors including hypertension, hypercholesterolemia and diabetes mellitus. Other option if she does not want a loop recorder would be to do a TEE to see for cardiac source of cerebral emboli.  If no etiology found, then she may need a loop recorder. Patient would like to think about both this and then make decision.      Discussed patient has opted not to undergo any invasive treatments at this time  Educated daughter Diana Harper regarding signs/symptoms of a stroke warrant calling [phone]  Educated daughter about [facility] for primary care in the home  Educated daughter about fall precautions and to call 911 if patient experiences a fall, especially if head trauma is a factor due to patient is taking a blood thinner Instructed daughter Diana Harper to notify patient's doctor of new symptoms or concerns  Encouraged her to ask for help when needed         Our next appointment is by telephone on 06/03/23 at 1:30 PM  Please call the care guide team at [phone] if you need to cancel or reschedule your appointment.   If you are  experiencing a Mental Health or Behavioral Health Crisis or need someone to talk to, please call 1-800-273-TALK (toll free, 24 hour hotline)  Patient verbalizes understanding of instructions and care plan provided today and agrees to view in MyChart. Active MyChart status and patient understanding of how to access instructions and care plan via MyChart confirmed with patient.     Delsa Sale, RN, BSN, CCM Care Management Coordinator [facility] Care Management  Direct Phone: [phone]

## 2023-05-06 NOTE — Patient Outreach (Signed)
  Care Coordination   Follow Up Visit Note   05/06/2023 Name: Diana Harper MRN: [id number] DOB: 08/16/1936  Diana Harper is a 87 y.o. year old female who sees Lewis Moccasin, MD for primary care. I spoke with  Diana Harper by phone today.  What matters to the patients health and wellness today?  Patient would like to stay healthy enough to stay out of the hospital and home with her daughter.     Goals Addressed             This Visit's Progress    RN Care Coordination Activities: further follow up needed       Care Coordination Interventions: Completed successful outbound call with patient and daughter Diana Harper Evaluation of current treatment plan related to acute CVA and patient's adherence to plan as established by provider Discussed and reviewed with daughter Diana Harper post hospital follow up with Dr. Jacinto Halim I discussed with the patient regarding options of therapy. However embolic stroke is most probably related to either aortic atherosclerosis and atheroemboli versus PAF leading to cardiac source of cerebral emboli. Options would be to place a loop recorder, control risk factors including hypertension, hypercholesterolemia and diabetes mellitus. Other option if she does not want a loop recorder would be to do a TEE to see for cardiac source of cerebral emboli.  If no etiology found, then she may need a loop recorder. Patient would like to think about both this and then make decision.      Discussed patient has opted not to undergo any invasive treatments at this time  Educated daughter Diana Harper regarding signs/symptoms of a stroke warrant calling 911  Educated daughter about [facility] for primary care in the home  Educated daughter about fall precautions and to call 911 if patient experiences a fall, especially if head trauma is a factor due to patient is taking a blood thinner Instructed daughter Diana Harper to notify patient's doctor of new symptoms or concerns  Encouraged  her to ask for help when needed     Interventions Today    Flowsheet Row Most Recent Value  Chronic Disease   Chronic disease during today's visit Chronic Kidney Disease/End Stage Renal Disease (ESRD), Other  [s/p TIA]  General Interventions   General Interventions Discussed/Reviewed General Interventions Discussed, General Interventions Reviewed, Labs, Doctor Visits, Lipid Profile  Doctor Visits Discussed/Reviewed Doctor Visits Discussed, Doctor Visits Reviewed, PCP, Specialist  Exercise Interventions   Exercise Discussed/Reviewed Exercise Discussed, Exercise Reviewed, Physical Activity  Physical Activity Discussed/Reviewed Physical Activity Reviewed, Physical Activity Discussed, Types of exercise  Education Interventions   Education Provided Provided Education  Provided Verbal Education On When to see the doctor, Labs, Exercise, Medication  Labs Reviewed Kidney Function, Lipid Profile  Mental Health Interventions   Mental Health Discussed/Reviewed Mental Health Discussed, Mental Health Reviewed, Coping Strategies  Nutrition Interventions   Nutrition Discussed/Reviewed Nutrition Discussed, Nutrition Reviewed, Fluid intake  Pharmacy Interventions   Pharmacy Dicussed/Reviewed Pharmacy Topics Discussed, Pharmacy Topics Reviewed, Medications and their functions  Safety Interventions   Safety Discussed/Reviewed Safety Discussed, Safety Reviewed, Fall Risk  Home Safety Assistive Devices          SDOH assessments and interventions completed:  No     Care Coordination Interventions:  Yes, provided   Follow up plan: Follow up call scheduled for 06/03/23 @1 :30 PM    Encounter Outcome:  Pt. Visit Completed

## 2023-05-09 ENCOUNTER — Telehealth: Payer: Self-pay

## 2023-05-09 DIAGNOSIS — E7800 Pure hypercholesterolemia, unspecified: Secondary | ICD-10-CM

## 2023-05-09 NOTE — Telephone Encounter (Signed)
Per Dr. Duanne Guess, can you start pt on Repatha due to statin intolerance. Very difficult for pcp to get approved. You can call her to discuss if needed.

## 2023-05-09 NOTE — Telephone Encounter (Signed)
ICD-10-CM   1. Hypercholesteremia  E78.00 Lipid Panel With LDL/HDL Ratio

## 2023-05-12 DIAGNOSIS — E1122 Type 2 diabetes mellitus with diabetic chronic kidney disease: Secondary | ICD-10-CM | POA: Diagnosis not present

## 2023-05-12 DIAGNOSIS — E1165 Type 2 diabetes mellitus with hyperglycemia: Secondary | ICD-10-CM | POA: Diagnosis not present

## 2023-05-12 DIAGNOSIS — I5030 Unspecified diastolic (congestive) heart failure: Secondary | ICD-10-CM | POA: Diagnosis not present

## 2023-05-12 DIAGNOSIS — I69354 Hemiplegia and hemiparesis following cerebral infarction affecting left non-dominant side: Secondary | ICD-10-CM | POA: Diagnosis not present

## 2023-05-12 DIAGNOSIS — E1142 Type 2 diabetes mellitus with diabetic polyneuropathy: Secondary | ICD-10-CM | POA: Diagnosis not present

## 2023-05-12 DIAGNOSIS — I130 Hypertensive heart and chronic kidney disease with heart failure and stage 1 through stage 4 chronic kidney disease, or unspecified chronic kidney disease: Secondary | ICD-10-CM | POA: Diagnosis not present

## 2023-05-13 DIAGNOSIS — E1122 Type 2 diabetes mellitus with diabetic chronic kidney disease: Secondary | ICD-10-CM | POA: Diagnosis not present

## 2023-05-13 DIAGNOSIS — E1142 Type 2 diabetes mellitus with diabetic polyneuropathy: Secondary | ICD-10-CM | POA: Diagnosis not present

## 2023-05-13 DIAGNOSIS — E1165 Type 2 diabetes mellitus with hyperglycemia: Secondary | ICD-10-CM | POA: Diagnosis not present

## 2023-05-13 DIAGNOSIS — D631 Anemia in chronic kidney disease: Secondary | ICD-10-CM | POA: Diagnosis not present

## 2023-05-13 DIAGNOSIS — I10 Essential (primary) hypertension: Secondary | ICD-10-CM | POA: Diagnosis not present

## 2023-05-13 DIAGNOSIS — I4891 Unspecified atrial fibrillation: Secondary | ICD-10-CM | POA: Diagnosis not present

## 2023-05-13 DIAGNOSIS — I130 Hypertensive heart and chronic kidney disease with heart failure and stage 1 through stage 4 chronic kidney disease, or unspecified chronic kidney disease: Secondary | ICD-10-CM | POA: Diagnosis not present

## 2023-05-13 DIAGNOSIS — J45909 Unspecified asthma, uncomplicated: Secondary | ICD-10-CM | POA: Diagnosis not present

## 2023-05-13 DIAGNOSIS — E038 Other specified hypothyroidism: Secondary | ICD-10-CM | POA: Diagnosis not present

## 2023-05-13 DIAGNOSIS — F419 Anxiety disorder, unspecified: Secondary | ICD-10-CM | POA: Diagnosis not present

## 2023-05-13 DIAGNOSIS — I69354 Hemiplegia and hemiparesis following cerebral infarction affecting left non-dominant side: Secondary | ICD-10-CM | POA: Diagnosis not present

## 2023-05-13 DIAGNOSIS — I5030 Unspecified diastolic (congestive) heart failure: Secondary | ICD-10-CM | POA: Diagnosis not present

## 2023-05-13 DIAGNOSIS — N184 Chronic kidney disease, stage 4 (severe): Secondary | ICD-10-CM | POA: Diagnosis not present

## 2023-05-17 DIAGNOSIS — E1122 Type 2 diabetes mellitus with diabetic chronic kidney disease: Secondary | ICD-10-CM | POA: Diagnosis not present

## 2023-05-17 DIAGNOSIS — E782 Mixed hyperlipidemia: Secondary | ICD-10-CM | POA: Diagnosis not present

## 2023-05-17 DIAGNOSIS — I129 Hypertensive chronic kidney disease with stage 1 through stage 4 chronic kidney disease, or unspecified chronic kidney disease: Secondary | ICD-10-CM | POA: Diagnosis not present

## 2023-05-17 DIAGNOSIS — E1121 Type 2 diabetes mellitus with diabetic nephropathy: Secondary | ICD-10-CM | POA: Diagnosis not present

## 2023-05-17 DIAGNOSIS — I639 Cerebral infarction, unspecified: Secondary | ICD-10-CM | POA: Diagnosis not present

## 2023-05-17 DIAGNOSIS — E1165 Type 2 diabetes mellitus with hyperglycemia: Secondary | ICD-10-CM | POA: Diagnosis not present

## 2023-05-17 DIAGNOSIS — I10 Essential (primary) hypertension: Secondary | ICD-10-CM | POA: Diagnosis not present

## 2023-05-19 DIAGNOSIS — I69354 Hemiplegia and hemiparesis following cerebral infarction affecting left non-dominant side: Secondary | ICD-10-CM | POA: Diagnosis not present

## 2023-05-19 DIAGNOSIS — I5030 Unspecified diastolic (congestive) heart failure: Secondary | ICD-10-CM | POA: Diagnosis not present

## 2023-05-19 DIAGNOSIS — E1165 Type 2 diabetes mellitus with hyperglycemia: Secondary | ICD-10-CM | POA: Diagnosis not present

## 2023-05-19 DIAGNOSIS — E1122 Type 2 diabetes mellitus with diabetic chronic kidney disease: Secondary | ICD-10-CM | POA: Diagnosis not present

## 2023-05-19 DIAGNOSIS — E1142 Type 2 diabetes mellitus with diabetic polyneuropathy: Secondary | ICD-10-CM | POA: Diagnosis not present

## 2023-05-19 DIAGNOSIS — I130 Hypertensive heart and chronic kidney disease with heart failure and stage 1 through stage 4 chronic kidney disease, or unspecified chronic kidney disease: Secondary | ICD-10-CM | POA: Diagnosis not present

## 2023-05-22 DIAGNOSIS — E1122 Type 2 diabetes mellitus with diabetic chronic kidney disease: Secondary | ICD-10-CM | POA: Diagnosis not present

## 2023-05-22 DIAGNOSIS — F419 Anxiety disorder, unspecified: Secondary | ICD-10-CM | POA: Diagnosis not present

## 2023-05-22 DIAGNOSIS — E1142 Type 2 diabetes mellitus with diabetic polyneuropathy: Secondary | ICD-10-CM | POA: Diagnosis not present

## 2023-05-22 DIAGNOSIS — Z7982 Long term (current) use of aspirin: Secondary | ICD-10-CM | POA: Diagnosis not present

## 2023-05-22 DIAGNOSIS — E1165 Type 2 diabetes mellitus with hyperglycemia: Secondary | ICD-10-CM | POA: Diagnosis not present

## 2023-05-22 DIAGNOSIS — E039 Hypothyroidism, unspecified: Secondary | ICD-10-CM | POA: Diagnosis not present

## 2023-05-22 DIAGNOSIS — Z7902 Long term (current) use of antithrombotics/antiplatelets: Secondary | ICD-10-CM | POA: Diagnosis not present

## 2023-05-22 DIAGNOSIS — D631 Anemia in chronic kidney disease: Secondary | ICD-10-CM | POA: Diagnosis not present

## 2023-05-22 DIAGNOSIS — Z974 Presence of external hearing-aid: Secondary | ICD-10-CM | POA: Diagnosis not present

## 2023-05-22 DIAGNOSIS — Z86010 Personal history of colonic polyps: Secondary | ICD-10-CM | POA: Diagnosis not present

## 2023-05-22 DIAGNOSIS — M1990 Unspecified osteoarthritis, unspecified site: Secondary | ICD-10-CM | POA: Diagnosis not present

## 2023-05-22 DIAGNOSIS — J45909 Unspecified asthma, uncomplicated: Secondary | ICD-10-CM | POA: Diagnosis not present

## 2023-05-22 DIAGNOSIS — H409 Unspecified glaucoma: Secondary | ICD-10-CM | POA: Diagnosis not present

## 2023-05-22 DIAGNOSIS — Z9841 Cataract extraction status, right eye: Secondary | ICD-10-CM | POA: Diagnosis not present

## 2023-05-22 DIAGNOSIS — Z794 Long term (current) use of insulin: Secondary | ICD-10-CM | POA: Diagnosis not present

## 2023-05-22 DIAGNOSIS — E785 Hyperlipidemia, unspecified: Secondary | ICD-10-CM | POA: Diagnosis not present

## 2023-05-22 DIAGNOSIS — E559 Vitamin D deficiency, unspecified: Secondary | ICD-10-CM | POA: Diagnosis not present

## 2023-05-22 DIAGNOSIS — Z90710 Acquired absence of both cervix and uterus: Secondary | ICD-10-CM | POA: Diagnosis not present

## 2023-05-22 DIAGNOSIS — N184 Chronic kidney disease, stage 4 (severe): Secondary | ICD-10-CM | POA: Diagnosis not present

## 2023-05-22 DIAGNOSIS — I5030 Unspecified diastolic (congestive) heart failure: Secondary | ICD-10-CM | POA: Diagnosis not present

## 2023-05-22 DIAGNOSIS — I69354 Hemiplegia and hemiparesis following cerebral infarction affecting left non-dominant side: Secondary | ICD-10-CM | POA: Diagnosis not present

## 2023-05-22 DIAGNOSIS — I130 Hypertensive heart and chronic kidney disease with heart failure and stage 1 through stage 4 chronic kidney disease, or unspecified chronic kidney disease: Secondary | ICD-10-CM | POA: Diagnosis not present

## 2023-05-22 DIAGNOSIS — Z9842 Cataract extraction status, left eye: Secondary | ICD-10-CM | POA: Diagnosis not present

## 2023-05-22 DIAGNOSIS — Z961 Presence of intraocular lens: Secondary | ICD-10-CM | POA: Diagnosis not present

## 2023-05-22 DIAGNOSIS — I4891 Unspecified atrial fibrillation: Secondary | ICD-10-CM | POA: Diagnosis not present

## 2023-05-24 DIAGNOSIS — E1142 Type 2 diabetes mellitus with diabetic polyneuropathy: Secondary | ICD-10-CM | POA: Diagnosis not present

## 2023-05-24 DIAGNOSIS — I69354 Hemiplegia and hemiparesis following cerebral infarction affecting left non-dominant side: Secondary | ICD-10-CM | POA: Diagnosis not present

## 2023-05-24 DIAGNOSIS — I5030 Unspecified diastolic (congestive) heart failure: Secondary | ICD-10-CM | POA: Diagnosis not present

## 2023-05-24 DIAGNOSIS — E1122 Type 2 diabetes mellitus with diabetic chronic kidney disease: Secondary | ICD-10-CM | POA: Diagnosis not present

## 2023-05-24 DIAGNOSIS — I130 Hypertensive heart and chronic kidney disease with heart failure and stage 1 through stage 4 chronic kidney disease, or unspecified chronic kidney disease: Secondary | ICD-10-CM | POA: Diagnosis not present

## 2023-05-24 DIAGNOSIS — E1165 Type 2 diabetes mellitus with hyperglycemia: Secondary | ICD-10-CM | POA: Diagnosis not present

## 2023-05-25 DIAGNOSIS — I130 Hypertensive heart and chronic kidney disease with heart failure and stage 1 through stage 4 chronic kidney disease, or unspecified chronic kidney disease: Secondary | ICD-10-CM | POA: Diagnosis not present

## 2023-05-25 DIAGNOSIS — E1122 Type 2 diabetes mellitus with diabetic chronic kidney disease: Secondary | ICD-10-CM | POA: Diagnosis not present

## 2023-05-25 DIAGNOSIS — I69354 Hemiplegia and hemiparesis following cerebral infarction affecting left non-dominant side: Secondary | ICD-10-CM | POA: Diagnosis not present

## 2023-05-25 DIAGNOSIS — E1142 Type 2 diabetes mellitus with diabetic polyneuropathy: Secondary | ICD-10-CM | POA: Diagnosis not present

## 2023-05-25 DIAGNOSIS — I5030 Unspecified diastolic (congestive) heart failure: Secondary | ICD-10-CM | POA: Diagnosis not present

## 2023-05-25 DIAGNOSIS — E1165 Type 2 diabetes mellitus with hyperglycemia: Secondary | ICD-10-CM | POA: Diagnosis not present

## 2023-05-26 ENCOUNTER — Telehealth: Payer: Self-pay | Admitting: *Deleted

## 2023-05-26 DIAGNOSIS — N3000 Acute cystitis without hematuria: Secondary | ICD-10-CM | POA: Diagnosis not present

## 2023-05-26 DIAGNOSIS — Z9989 Dependence on other enabling machines and devices: Secondary | ICD-10-CM | POA: Diagnosis not present

## 2023-05-26 DIAGNOSIS — R296 Repeated falls: Secondary | ICD-10-CM | POA: Diagnosis not present

## 2023-05-26 NOTE — Progress Notes (Signed)
  Care Coordination Note  05/26/2023 Name: Diana Harper MRN: [id number] DOB: Nov 23, 1936  [name] is a 87 y.o. year old female who is a primary care patient of Lewis Moccasin, MD and is actively engaged with the care management team. I reached out to [name] by phone today to assist with re-scheduling a follow up visit with the RN Case Manager  Follow up plan: Unsuccessful telephone outreach attempt made. A HIPAA compliant phone message was left for the patient providing contact information and requesting a return call.   [facility]  Care Coordination Care Guide  Direct Dial: [phone]

## 2023-05-30 NOTE — Progress Notes (Signed)
  Care Coordination Note  05/30/2023 Name: Ku. Repinski MRN: [id number] DOB: [birth year]  Diana Harper is a 87 y.o. year old female who is a primary care patient of Lewis Moccasin, MD and is actively engaged with the care management team. I reached out to [name] by phone today to assist with re-scheduling a follow up visit with the RN Case Manager  Follow up plan: Unsuccessful telephone outreach attempt made. A HIPAA compliant phone message was left for the patient providing contact information and requesting a return call.  [facility]  Care Coordination Care Guide  Direct Dial: [phone]

## 2023-05-31 DIAGNOSIS — E1142 Type 2 diabetes mellitus with diabetic polyneuropathy: Secondary | ICD-10-CM | POA: Diagnosis not present

## 2023-05-31 DIAGNOSIS — I69354 Hemiplegia and hemiparesis following cerebral infarction affecting left non-dominant side: Secondary | ICD-10-CM | POA: Diagnosis not present

## 2023-05-31 DIAGNOSIS — I130 Hypertensive heart and chronic kidney disease with heart failure and stage 1 through stage 4 chronic kidney disease, or unspecified chronic kidney disease: Secondary | ICD-10-CM | POA: Diagnosis not present

## 2023-05-31 DIAGNOSIS — E1165 Type 2 diabetes mellitus with hyperglycemia: Secondary | ICD-10-CM | POA: Diagnosis not present

## 2023-05-31 DIAGNOSIS — I5030 Unspecified diastolic (congestive) heart failure: Secondary | ICD-10-CM | POA: Diagnosis not present

## 2023-05-31 DIAGNOSIS — E1122 Type 2 diabetes mellitus with diabetic chronic kidney disease: Secondary | ICD-10-CM | POA: Diagnosis not present

## 2023-05-31 NOTE — Progress Notes (Signed)
  Care Coordination Note  05/31/2023 Name: Kiani Besselman MRN: [id number] DOB: 06/11/1936  Bilan Ruffcorn is a 87 y.o. year old female who is a primary care patient of Lewis Moccasin, MD and is actively engaged with the care management team. I reached out to [name] by phone today to assist with re-scheduling a follow up visit with the RN Case Manager  Follow up plan: Unsuccessful telephone outreach attempt made. A HIPAA compliant phone message was left for the patient providing contact information and requesting a return call.   [facility]  Care Coordination Care Guide  Direct Dial: [phone]

## 2023-06-02 DIAGNOSIS — I69354 Hemiplegia and hemiparesis following cerebral infarction affecting left non-dominant side: Secondary | ICD-10-CM | POA: Diagnosis not present

## 2023-06-02 DIAGNOSIS — E1122 Type 2 diabetes mellitus with diabetic chronic kidney disease: Secondary | ICD-10-CM | POA: Diagnosis not present

## 2023-06-02 DIAGNOSIS — E1165 Type 2 diabetes mellitus with hyperglycemia: Secondary | ICD-10-CM | POA: Diagnosis not present

## 2023-06-02 DIAGNOSIS — E1142 Type 2 diabetes mellitus with diabetic polyneuropathy: Secondary | ICD-10-CM | POA: Diagnosis not present

## 2023-06-02 DIAGNOSIS — I130 Hypertensive heart and chronic kidney disease with heart failure and stage 1 through stage 4 chronic kidney disease, or unspecified chronic kidney disease: Secondary | ICD-10-CM | POA: Diagnosis not present

## 2023-06-02 DIAGNOSIS — I5030 Unspecified diastolic (congestive) heart failure: Secondary | ICD-10-CM | POA: Diagnosis not present

## 2023-06-03 NOTE — Progress Notes (Signed)
  Care Coordination Note  06/03/2023 Name: [name] MRN: [id number] DOB: [birth year]  Diana Harper is a 87 y.o. year old female who is a primary care patient of Lewis Moccasin, MD and is actively engaged with the care management team. I reached out to [name] by phone today to assist with re-scheduling a follow up visit with the RN Case Manager  Follow up plan: Unsuccessful telephone outreach attempt made. A HIPAA compliant phone message was left for the patient providing contact information and requesting a return call.  We have been unable to make contact with the patient for follow up. The care management team is available to follow up with the patient after provider conversation with the patient regarding recommendation for care management engagement and subsequent re-referral to the care management team.   [facility] Coordination Care Guide  Direct Dial: [phone]

## 2023-06-06 DIAGNOSIS — I69354 Hemiplegia and hemiparesis following cerebral infarction affecting left non-dominant side: Secondary | ICD-10-CM | POA: Diagnosis not present

## 2023-06-06 DIAGNOSIS — E1165 Type 2 diabetes mellitus with hyperglycemia: Secondary | ICD-10-CM | POA: Diagnosis not present

## 2023-06-06 DIAGNOSIS — E1142 Type 2 diabetes mellitus with diabetic polyneuropathy: Secondary | ICD-10-CM | POA: Diagnosis not present

## 2023-06-06 DIAGNOSIS — E1122 Type 2 diabetes mellitus with diabetic chronic kidney disease: Secondary | ICD-10-CM | POA: Diagnosis not present

## 2023-06-06 DIAGNOSIS — I5030 Unspecified diastolic (congestive) heart failure: Secondary | ICD-10-CM | POA: Diagnosis not present

## 2023-06-06 DIAGNOSIS — I130 Hypertensive heart and chronic kidney disease with heart failure and stage 1 through stage 4 chronic kidney disease, or unspecified chronic kidney disease: Secondary | ICD-10-CM | POA: Diagnosis not present

## 2023-06-15 DIAGNOSIS — E1142 Type 2 diabetes mellitus with diabetic polyneuropathy: Secondary | ICD-10-CM | POA: Diagnosis not present

## 2023-06-15 DIAGNOSIS — E1122 Type 2 diabetes mellitus with diabetic chronic kidney disease: Secondary | ICD-10-CM | POA: Diagnosis not present

## 2023-06-15 DIAGNOSIS — I69354 Hemiplegia and hemiparesis following cerebral infarction affecting left non-dominant side: Secondary | ICD-10-CM | POA: Diagnosis not present

## 2023-06-15 DIAGNOSIS — E1165 Type 2 diabetes mellitus with hyperglycemia: Secondary | ICD-10-CM | POA: Diagnosis not present

## 2023-06-15 DIAGNOSIS — I5030 Unspecified diastolic (congestive) heart failure: Secondary | ICD-10-CM | POA: Diagnosis not present

## 2023-06-15 DIAGNOSIS — I130 Hypertensive heart and chronic kidney disease with heart failure and stage 1 through stage 4 chronic kidney disease, or unspecified chronic kidney disease: Secondary | ICD-10-CM | POA: Diagnosis not present

## 2023-06-17 DIAGNOSIS — E1142 Type 2 diabetes mellitus with diabetic polyneuropathy: Secondary | ICD-10-CM | POA: Diagnosis not present

## 2023-06-17 DIAGNOSIS — I130 Hypertensive heart and chronic kidney disease with heart failure and stage 1 through stage 4 chronic kidney disease, or unspecified chronic kidney disease: Secondary | ICD-10-CM | POA: Diagnosis not present

## 2023-06-17 DIAGNOSIS — E1122 Type 2 diabetes mellitus with diabetic chronic kidney disease: Secondary | ICD-10-CM | POA: Diagnosis not present

## 2023-06-17 DIAGNOSIS — E1165 Type 2 diabetes mellitus with hyperglycemia: Secondary | ICD-10-CM | POA: Diagnosis not present

## 2023-06-17 DIAGNOSIS — I5030 Unspecified diastolic (congestive) heart failure: Secondary | ICD-10-CM | POA: Diagnosis not present

## 2023-06-17 DIAGNOSIS — I69354 Hemiplegia and hemiparesis following cerebral infarction affecting left non-dominant side: Secondary | ICD-10-CM | POA: Diagnosis not present

## 2023-06-21 DIAGNOSIS — F419 Anxiety disorder, unspecified: Secondary | ICD-10-CM | POA: Diagnosis not present

## 2023-06-21 DIAGNOSIS — Z86010 Personal history of colonic polyps: Secondary | ICD-10-CM | POA: Diagnosis not present

## 2023-06-21 DIAGNOSIS — J45909 Unspecified asthma, uncomplicated: Secondary | ICD-10-CM | POA: Diagnosis not present

## 2023-06-21 DIAGNOSIS — E785 Hyperlipidemia, unspecified: Secondary | ICD-10-CM | POA: Diagnosis not present

## 2023-06-21 DIAGNOSIS — Z7902 Long term (current) use of antithrombotics/antiplatelets: Secondary | ICD-10-CM | POA: Diagnosis not present

## 2023-06-21 DIAGNOSIS — H409 Unspecified glaucoma: Secondary | ICD-10-CM | POA: Diagnosis not present

## 2023-06-21 DIAGNOSIS — D631 Anemia in chronic kidney disease: Secondary | ICD-10-CM | POA: Diagnosis not present

## 2023-06-21 DIAGNOSIS — E1142 Type 2 diabetes mellitus with diabetic polyneuropathy: Secondary | ICD-10-CM | POA: Diagnosis not present

## 2023-06-21 DIAGNOSIS — Z961 Presence of intraocular lens: Secondary | ICD-10-CM | POA: Diagnosis not present

## 2023-06-21 DIAGNOSIS — Z794 Long term (current) use of insulin: Secondary | ICD-10-CM | POA: Diagnosis not present

## 2023-06-21 DIAGNOSIS — I5030 Unspecified diastolic (congestive) heart failure: Secondary | ICD-10-CM | POA: Diagnosis not present

## 2023-06-21 DIAGNOSIS — Z7951 Long term (current) use of inhaled steroids: Secondary | ICD-10-CM | POA: Diagnosis not present

## 2023-06-21 DIAGNOSIS — Z9841 Cataract extraction status, right eye: Secondary | ICD-10-CM | POA: Diagnosis not present

## 2023-06-21 DIAGNOSIS — N184 Chronic kidney disease, stage 4 (severe): Secondary | ICD-10-CM | POA: Diagnosis not present

## 2023-06-21 DIAGNOSIS — E559 Vitamin D deficiency, unspecified: Secondary | ICD-10-CM | POA: Diagnosis not present

## 2023-06-21 DIAGNOSIS — Z9842 Cataract extraction status, left eye: Secondary | ICD-10-CM | POA: Diagnosis not present

## 2023-06-21 DIAGNOSIS — E039 Hypothyroidism, unspecified: Secondary | ICD-10-CM | POA: Diagnosis not present

## 2023-06-21 DIAGNOSIS — I69354 Hemiplegia and hemiparesis following cerebral infarction affecting left non-dominant side: Secondary | ICD-10-CM | POA: Diagnosis not present

## 2023-06-21 DIAGNOSIS — Z9181 History of falling: Secondary | ICD-10-CM | POA: Diagnosis not present

## 2023-06-21 DIAGNOSIS — E1122 Type 2 diabetes mellitus with diabetic chronic kidney disease: Secondary | ICD-10-CM | POA: Diagnosis not present

## 2023-06-21 DIAGNOSIS — Z974 Presence of external hearing-aid: Secondary | ICD-10-CM | POA: Diagnosis not present

## 2023-06-21 DIAGNOSIS — I4891 Unspecified atrial fibrillation: Secondary | ICD-10-CM | POA: Diagnosis not present

## 2023-06-21 DIAGNOSIS — I130 Hypertensive heart and chronic kidney disease with heart failure and stage 1 through stage 4 chronic kidney disease, or unspecified chronic kidney disease: Secondary | ICD-10-CM | POA: Diagnosis not present

## 2023-06-21 DIAGNOSIS — Z90710 Acquired absence of both cervix and uterus: Secondary | ICD-10-CM | POA: Diagnosis not present

## 2023-06-21 DIAGNOSIS — M1990 Unspecified osteoarthritis, unspecified site: Secondary | ICD-10-CM | POA: Diagnosis not present

## 2023-06-22 DIAGNOSIS — I130 Hypertensive heart and chronic kidney disease with heart failure and stage 1 through stage 4 chronic kidney disease, or unspecified chronic kidney disease: Secondary | ICD-10-CM | POA: Diagnosis not present

## 2023-06-22 DIAGNOSIS — N184 Chronic kidney disease, stage 4 (severe): Secondary | ICD-10-CM | POA: Diagnosis not present

## 2023-06-22 DIAGNOSIS — E1122 Type 2 diabetes mellitus with diabetic chronic kidney disease: Secondary | ICD-10-CM | POA: Diagnosis not present

## 2023-06-22 DIAGNOSIS — E1142 Type 2 diabetes mellitus with diabetic polyneuropathy: Secondary | ICD-10-CM | POA: Diagnosis not present

## 2023-06-22 DIAGNOSIS — I69354 Hemiplegia and hemiparesis following cerebral infarction affecting left non-dominant side: Secondary | ICD-10-CM | POA: Diagnosis not present

## 2023-06-22 DIAGNOSIS — I5030 Unspecified diastolic (congestive) heart failure: Secondary | ICD-10-CM | POA: Diagnosis not present

## 2023-06-27 DIAGNOSIS — I130 Hypertensive heart and chronic kidney disease with heart failure and stage 1 through stage 4 chronic kidney disease, or unspecified chronic kidney disease: Secondary | ICD-10-CM | POA: Diagnosis not present

## 2023-06-27 DIAGNOSIS — N184 Chronic kidney disease, stage 4 (severe): Secondary | ICD-10-CM | POA: Diagnosis not present

## 2023-06-27 DIAGNOSIS — I69354 Hemiplegia and hemiparesis following cerebral infarction affecting left non-dominant side: Secondary | ICD-10-CM | POA: Diagnosis not present

## 2023-06-27 DIAGNOSIS — E1122 Type 2 diabetes mellitus with diabetic chronic kidney disease: Secondary | ICD-10-CM | POA: Diagnosis not present

## 2023-06-27 DIAGNOSIS — I5030 Unspecified diastolic (congestive) heart failure: Secondary | ICD-10-CM | POA: Diagnosis not present

## 2023-06-27 DIAGNOSIS — E1142 Type 2 diabetes mellitus with diabetic polyneuropathy: Secondary | ICD-10-CM | POA: Diagnosis not present

## 2023-07-01 DIAGNOSIS — N3000 Acute cystitis without hematuria: Secondary | ICD-10-CM | POA: Diagnosis not present

## 2023-07-04 DIAGNOSIS — I69354 Hemiplegia and hemiparesis following cerebral infarction affecting left non-dominant side: Secondary | ICD-10-CM | POA: Diagnosis not present

## 2023-07-04 DIAGNOSIS — I130 Hypertensive heart and chronic kidney disease with heart failure and stage 1 through stage 4 chronic kidney disease, or unspecified chronic kidney disease: Secondary | ICD-10-CM | POA: Diagnosis not present

## 2023-07-04 DIAGNOSIS — I5030 Unspecified diastolic (congestive) heart failure: Secondary | ICD-10-CM | POA: Diagnosis not present

## 2023-07-04 DIAGNOSIS — N184 Chronic kidney disease, stage 4 (severe): Secondary | ICD-10-CM | POA: Diagnosis not present

## 2023-07-04 DIAGNOSIS — E1122 Type 2 diabetes mellitus with diabetic chronic kidney disease: Secondary | ICD-10-CM | POA: Diagnosis not present

## 2023-07-04 DIAGNOSIS — E1142 Type 2 diabetes mellitus with diabetic polyneuropathy: Secondary | ICD-10-CM | POA: Diagnosis not present

## 2023-07-14 DIAGNOSIS — E1122 Type 2 diabetes mellitus with diabetic chronic kidney disease: Secondary | ICD-10-CM | POA: Diagnosis not present

## 2023-07-14 DIAGNOSIS — I130 Hypertensive heart and chronic kidney disease with heart failure and stage 1 through stage 4 chronic kidney disease, or unspecified chronic kidney disease: Secondary | ICD-10-CM | POA: Diagnosis not present

## 2023-07-14 DIAGNOSIS — E1142 Type 2 diabetes mellitus with diabetic polyneuropathy: Secondary | ICD-10-CM | POA: Diagnosis not present

## 2023-07-14 DIAGNOSIS — N184 Chronic kidney disease, stage 4 (severe): Secondary | ICD-10-CM | POA: Diagnosis not present

## 2023-07-14 DIAGNOSIS — I5030 Unspecified diastolic (congestive) heart failure: Secondary | ICD-10-CM | POA: Diagnosis not present

## 2023-07-14 DIAGNOSIS — I69354 Hemiplegia and hemiparesis following cerebral infarction affecting left non-dominant side: Secondary | ICD-10-CM | POA: Diagnosis not present

## 2023-07-15 DIAGNOSIS — N390 Urinary tract infection, site not specified: Secondary | ICD-10-CM | POA: Diagnosis not present

## 2023-07-15 DIAGNOSIS — E1165 Type 2 diabetes mellitus with hyperglycemia: Secondary | ICD-10-CM | POA: Diagnosis not present

## 2023-07-18 DIAGNOSIS — E1122 Type 2 diabetes mellitus with diabetic chronic kidney disease: Secondary | ICD-10-CM | POA: Diagnosis not present

## 2023-07-18 DIAGNOSIS — I5030 Unspecified diastolic (congestive) heart failure: Secondary | ICD-10-CM | POA: Diagnosis not present

## 2023-07-18 DIAGNOSIS — I69354 Hemiplegia and hemiparesis following cerebral infarction affecting left non-dominant side: Secondary | ICD-10-CM | POA: Diagnosis not present

## 2023-07-18 DIAGNOSIS — E039 Hypothyroidism, unspecified: Secondary | ICD-10-CM | POA: Diagnosis not present

## 2023-07-18 DIAGNOSIS — M1990 Unspecified osteoarthritis, unspecified site: Secondary | ICD-10-CM | POA: Diagnosis not present

## 2023-07-18 DIAGNOSIS — E1142 Type 2 diabetes mellitus with diabetic polyneuropathy: Secondary | ICD-10-CM | POA: Diagnosis not present

## 2023-07-18 DIAGNOSIS — I4891 Unspecified atrial fibrillation: Secondary | ICD-10-CM | POA: Diagnosis not present

## 2023-07-18 DIAGNOSIS — I130 Hypertensive heart and chronic kidney disease with heart failure and stage 1 through stage 4 chronic kidney disease, or unspecified chronic kidney disease: Secondary | ICD-10-CM | POA: Diagnosis not present

## 2023-07-18 DIAGNOSIS — D631 Anemia in chronic kidney disease: Secondary | ICD-10-CM | POA: Diagnosis not present

## 2023-07-18 DIAGNOSIS — F419 Anxiety disorder, unspecified: Secondary | ICD-10-CM | POA: Diagnosis not present

## 2023-07-18 DIAGNOSIS — J45909 Unspecified asthma, uncomplicated: Secondary | ICD-10-CM | POA: Diagnosis not present

## 2023-07-18 DIAGNOSIS — N184 Chronic kidney disease, stage 4 (severe): Secondary | ICD-10-CM | POA: Diagnosis not present

## 2023-07-19 DIAGNOSIS — I10 Essential (primary) hypertension: Secondary | ICD-10-CM | POA: Diagnosis not present

## 2023-07-19 DIAGNOSIS — E1165 Type 2 diabetes mellitus with hyperglycemia: Secondary | ICD-10-CM | POA: Diagnosis not present

## 2023-07-20 DIAGNOSIS — R197 Diarrhea, unspecified: Secondary | ICD-10-CM | POA: Diagnosis not present

## 2023-07-20 DIAGNOSIS — R55 Syncope and collapse: Secondary | ICD-10-CM | POA: Diagnosis not present

## 2023-07-21 DIAGNOSIS — D631 Anemia in chronic kidney disease: Secondary | ICD-10-CM | POA: Diagnosis not present

## 2023-07-21 DIAGNOSIS — E559 Vitamin D deficiency, unspecified: Secondary | ICD-10-CM | POA: Diagnosis not present

## 2023-07-21 DIAGNOSIS — Z90710 Acquired absence of both cervix and uterus: Secondary | ICD-10-CM | POA: Diagnosis not present

## 2023-07-21 DIAGNOSIS — I5030 Unspecified diastolic (congestive) heart failure: Secondary | ICD-10-CM | POA: Diagnosis not present

## 2023-07-21 DIAGNOSIS — Z9181 History of falling: Secondary | ICD-10-CM | POA: Diagnosis not present

## 2023-07-21 DIAGNOSIS — Z961 Presence of intraocular lens: Secondary | ICD-10-CM | POA: Diagnosis not present

## 2023-07-21 DIAGNOSIS — Z9842 Cataract extraction status, left eye: Secondary | ICD-10-CM | POA: Diagnosis not present

## 2023-07-21 DIAGNOSIS — Z9841 Cataract extraction status, right eye: Secondary | ICD-10-CM | POA: Diagnosis not present

## 2023-07-21 DIAGNOSIS — N184 Chronic kidney disease, stage 4 (severe): Secondary | ICD-10-CM | POA: Diagnosis not present

## 2023-07-21 DIAGNOSIS — I130 Hypertensive heart and chronic kidney disease with heart failure and stage 1 through stage 4 chronic kidney disease, or unspecified chronic kidney disease: Secondary | ICD-10-CM | POA: Diagnosis not present

## 2023-07-21 DIAGNOSIS — I69354 Hemiplegia and hemiparesis following cerebral infarction affecting left non-dominant side: Secondary | ICD-10-CM | POA: Diagnosis not present

## 2023-07-21 DIAGNOSIS — Z974 Presence of external hearing-aid: Secondary | ICD-10-CM | POA: Diagnosis not present

## 2023-07-21 DIAGNOSIS — Z86010 Personal history of colonic polyps: Secondary | ICD-10-CM | POA: Diagnosis not present

## 2023-07-21 DIAGNOSIS — E039 Hypothyroidism, unspecified: Secondary | ICD-10-CM | POA: Diagnosis not present

## 2023-07-21 DIAGNOSIS — J45909 Unspecified asthma, uncomplicated: Secondary | ICD-10-CM | POA: Diagnosis not present

## 2023-07-21 DIAGNOSIS — Z794 Long term (current) use of insulin: Secondary | ICD-10-CM | POA: Diagnosis not present

## 2023-07-21 DIAGNOSIS — I4891 Unspecified atrial fibrillation: Secondary | ICD-10-CM | POA: Diagnosis not present

## 2023-07-21 DIAGNOSIS — F419 Anxiety disorder, unspecified: Secondary | ICD-10-CM | POA: Diagnosis not present

## 2023-07-21 DIAGNOSIS — E1142 Type 2 diabetes mellitus with diabetic polyneuropathy: Secondary | ICD-10-CM | POA: Diagnosis not present

## 2023-07-21 DIAGNOSIS — M1990 Unspecified osteoarthritis, unspecified site: Secondary | ICD-10-CM | POA: Diagnosis not present

## 2023-07-21 DIAGNOSIS — E785 Hyperlipidemia, unspecified: Secondary | ICD-10-CM | POA: Diagnosis not present

## 2023-07-21 DIAGNOSIS — H409 Unspecified glaucoma: Secondary | ICD-10-CM | POA: Diagnosis not present

## 2023-07-21 DIAGNOSIS — E1122 Type 2 diabetes mellitus with diabetic chronic kidney disease: Secondary | ICD-10-CM | POA: Diagnosis not present

## 2023-07-21 DIAGNOSIS — Z7951 Long term (current) use of inhaled steroids: Secondary | ICD-10-CM | POA: Diagnosis not present

## 2023-07-21 DIAGNOSIS — Z7902 Long term (current) use of antithrombotics/antiplatelets: Secondary | ICD-10-CM | POA: Diagnosis not present

## 2023-07-26 DIAGNOSIS — N179 Acute kidney failure, unspecified: Secondary | ICD-10-CM | POA: Diagnosis not present

## 2023-07-26 DIAGNOSIS — N390 Urinary tract infection, site not specified: Secondary | ICD-10-CM | POA: Diagnosis not present

## 2023-07-26 DIAGNOSIS — N1832 Chronic kidney disease, stage 3b: Secondary | ICD-10-CM | POA: Diagnosis not present

## 2023-07-26 DIAGNOSIS — I129 Hypertensive chronic kidney disease with stage 1 through stage 4 chronic kidney disease, or unspecified chronic kidney disease: Secondary | ICD-10-CM | POA: Diagnosis not present

## 2023-07-26 DIAGNOSIS — E1122 Type 2 diabetes mellitus with diabetic chronic kidney disease: Secondary | ICD-10-CM | POA: Diagnosis not present

## 2023-07-26 DIAGNOSIS — E039 Hypothyroidism, unspecified: Secondary | ICD-10-CM | POA: Diagnosis not present

## 2023-07-26 DIAGNOSIS — E1165 Type 2 diabetes mellitus with hyperglycemia: Secondary | ICD-10-CM | POA: Diagnosis not present

## 2023-07-26 DIAGNOSIS — D638 Anemia in other chronic diseases classified elsewhere: Secondary | ICD-10-CM | POA: Diagnosis not present

## 2023-07-29 DIAGNOSIS — N1832 Chronic kidney disease, stage 3b: Secondary | ICD-10-CM | POA: Diagnosis not present

## 2023-07-29 DIAGNOSIS — N390 Urinary tract infection, site not specified: Secondary | ICD-10-CM | POA: Diagnosis not present

## 2023-08-01 DIAGNOSIS — I130 Hypertensive heart and chronic kidney disease with heart failure and stage 1 through stage 4 chronic kidney disease, or unspecified chronic kidney disease: Secondary | ICD-10-CM | POA: Diagnosis not present

## 2023-08-01 DIAGNOSIS — E1142 Type 2 diabetes mellitus with diabetic polyneuropathy: Secondary | ICD-10-CM | POA: Diagnosis not present

## 2023-08-01 DIAGNOSIS — N184 Chronic kidney disease, stage 4 (severe): Secondary | ICD-10-CM | POA: Diagnosis not present

## 2023-08-01 DIAGNOSIS — I5030 Unspecified diastolic (congestive) heart failure: Secondary | ICD-10-CM | POA: Diagnosis not present

## 2023-08-01 DIAGNOSIS — I69354 Hemiplegia and hemiparesis following cerebral infarction affecting left non-dominant side: Secondary | ICD-10-CM | POA: Diagnosis not present

## 2023-08-01 DIAGNOSIS — E1122 Type 2 diabetes mellitus with diabetic chronic kidney disease: Secondary | ICD-10-CM | POA: Diagnosis not present

## 2023-08-08 DIAGNOSIS — I69354 Hemiplegia and hemiparesis following cerebral infarction affecting left non-dominant side: Secondary | ICD-10-CM | POA: Diagnosis not present

## 2023-08-08 DIAGNOSIS — E1122 Type 2 diabetes mellitus with diabetic chronic kidney disease: Secondary | ICD-10-CM | POA: Diagnosis not present

## 2023-08-08 DIAGNOSIS — N184 Chronic kidney disease, stage 4 (severe): Secondary | ICD-10-CM | POA: Diagnosis not present

## 2023-08-08 DIAGNOSIS — I5030 Unspecified diastolic (congestive) heart failure: Secondary | ICD-10-CM | POA: Diagnosis not present

## 2023-08-08 DIAGNOSIS — E1142 Type 2 diabetes mellitus with diabetic polyneuropathy: Secondary | ICD-10-CM | POA: Diagnosis not present

## 2023-08-08 DIAGNOSIS — I130 Hypertensive heart and chronic kidney disease with heart failure and stage 1 through stage 4 chronic kidney disease, or unspecified chronic kidney disease: Secondary | ICD-10-CM | POA: Diagnosis not present

## 2023-08-15 DIAGNOSIS — E1142 Type 2 diabetes mellitus with diabetic polyneuropathy: Secondary | ICD-10-CM | POA: Diagnosis not present

## 2023-08-15 DIAGNOSIS — I5030 Unspecified diastolic (congestive) heart failure: Secondary | ICD-10-CM | POA: Diagnosis not present

## 2023-08-15 DIAGNOSIS — E1122 Type 2 diabetes mellitus with diabetic chronic kidney disease: Secondary | ICD-10-CM | POA: Diagnosis not present

## 2023-08-15 DIAGNOSIS — N184 Chronic kidney disease, stage 4 (severe): Secondary | ICD-10-CM | POA: Diagnosis not present

## 2023-08-15 DIAGNOSIS — I69354 Hemiplegia and hemiparesis following cerebral infarction affecting left non-dominant side: Secondary | ICD-10-CM | POA: Diagnosis not present

## 2023-08-15 DIAGNOSIS — I130 Hypertensive heart and chronic kidney disease with heart failure and stage 1 through stage 4 chronic kidney disease, or unspecified chronic kidney disease: Secondary | ICD-10-CM | POA: Diagnosis not present

## 2023-09-08 ENCOUNTER — Other Ambulatory Visit: Payer: Self-pay | Admitting: Family Medicine

## 2023-09-08 DIAGNOSIS — E1121 Type 2 diabetes mellitus with diabetic nephropathy: Secondary | ICD-10-CM | POA: Diagnosis not present

## 2023-09-08 DIAGNOSIS — R32 Unspecified urinary incontinence: Secondary | ICD-10-CM | POA: Diagnosis not present

## 2023-09-08 DIAGNOSIS — I10 Essential (primary) hypertension: Secondary | ICD-10-CM | POA: Diagnosis not present

## 2023-09-08 DIAGNOSIS — I639 Cerebral infarction, unspecified: Secondary | ICD-10-CM

## 2023-09-08 DIAGNOSIS — E039 Hypothyroidism, unspecified: Secondary | ICD-10-CM | POA: Diagnosis not present

## 2023-09-08 DIAGNOSIS — F0390 Unspecified dementia without behavioral disturbance: Secondary | ICD-10-CM | POA: Diagnosis not present

## 2023-09-08 DIAGNOSIS — R634 Abnormal weight loss: Secondary | ICD-10-CM | POA: Diagnosis not present

## 2023-09-09 ENCOUNTER — Other Ambulatory Visit: Payer: Medicare Other

## 2023-09-09 ENCOUNTER — Ambulatory Visit
Admission: RE | Admit: 2023-09-09 | Discharge: 2023-09-09 | Disposition: A | Discharge status: Home / Self Care | Payer: Medicare Other | Source: Ambulatory Visit | Attending: Family Medicine | Admitting: Family Medicine

## 2023-09-09 DIAGNOSIS — R5383 Other fatigue: Secondary | ICD-10-CM | POA: Diagnosis not present

## 2023-09-09 DIAGNOSIS — I639 Cerebral infarction, unspecified: Secondary | ICD-10-CM

## 2023-09-09 DIAGNOSIS — R4781 Slurred speech: Secondary | ICD-10-CM | POA: Diagnosis not present

## 2023-09-09 DIAGNOSIS — I10 Essential (primary) hypertension: Secondary | ICD-10-CM | POA: Diagnosis not present

## 2023-09-09 DIAGNOSIS — R262 Difficulty in walking, not elsewhere classified: Secondary | ICD-10-CM | POA: Diagnosis not present

## 2023-09-09 DIAGNOSIS — F0390 Unspecified dementia without behavioral disturbance: Secondary | ICD-10-CM | POA: Diagnosis not present

## 2023-09-09 DIAGNOSIS — Z8673 Personal history of transient ischemic attack (TIA), and cerebral infarction without residual deficits: Secondary | ICD-10-CM | POA: Diagnosis not present

## 2023-09-09 DIAGNOSIS — E039 Hypothyroidism, unspecified: Secondary | ICD-10-CM | POA: Diagnosis not present

## 2023-09-14 DIAGNOSIS — F411 Generalized anxiety disorder: Secondary | ICD-10-CM | POA: Diagnosis not present

## 2023-09-14 DIAGNOSIS — T466X5A Adverse effect of antihyperlipidemic and antiarteriosclerotic drugs, initial encounter: Secondary | ICD-10-CM | POA: Diagnosis not present

## 2023-09-14 DIAGNOSIS — I10 Essential (primary) hypertension: Secondary | ICD-10-CM | POA: Diagnosis not present

## 2023-09-14 DIAGNOSIS — M791 Myalgia, unspecified site: Secondary | ICD-10-CM | POA: Diagnosis not present

## 2023-09-14 DIAGNOSIS — R634 Abnormal weight loss: Secondary | ICD-10-CM | POA: Diagnosis not present

## 2023-09-14 DIAGNOSIS — F0390 Unspecified dementia without behavioral disturbance: Secondary | ICD-10-CM | POA: Diagnosis not present

## 2023-09-14 DIAGNOSIS — Z789 Other specified health status: Secondary | ICD-10-CM | POA: Diagnosis not present

## 2023-09-14 DIAGNOSIS — E039 Hypothyroidism, unspecified: Secondary | ICD-10-CM | POA: Diagnosis not present

## 2023-09-14 DIAGNOSIS — E1121 Type 2 diabetes mellitus with diabetic nephropathy: Secondary | ICD-10-CM | POA: Diagnosis not present

## 2023-09-22 ENCOUNTER — Other Ambulatory Visit: Payer: Self-pay

## 2023-09-28 DIAGNOSIS — I10 Essential (primary) hypertension: Secondary | ICD-10-CM | POA: Diagnosis not present

## 2023-09-28 DIAGNOSIS — I639 Cerebral infarction, unspecified: Secondary | ICD-10-CM | POA: Diagnosis not present

## 2023-09-28 DIAGNOSIS — F331 Major depressive disorder, recurrent, moderate: Secondary | ICD-10-CM | POA: Diagnosis not present

## 2023-09-28 DIAGNOSIS — G4700 Insomnia, unspecified: Secondary | ICD-10-CM | POA: Diagnosis not present

## 2023-09-28 DIAGNOSIS — F0390 Unspecified dementia without behavioral disturbance: Secondary | ICD-10-CM | POA: Diagnosis not present

## 2023-09-28 DIAGNOSIS — F411 Generalized anxiety disorder: Secondary | ICD-10-CM | POA: Diagnosis not present

## 2023-09-28 DIAGNOSIS — R296 Repeated falls: Secondary | ICD-10-CM | POA: Diagnosis not present

## 2023-09-28 DIAGNOSIS — E11319 Type 2 diabetes mellitus with unspecified diabetic retinopathy without macular edema: Secondary | ICD-10-CM | POA: Diagnosis not present

## 2023-10-08 ENCOUNTER — Emergency Department (HOSPITAL_COMMUNITY): Payer: Medicare Other

## 2023-10-08 ENCOUNTER — Inpatient Hospital Stay (HOSPITAL_COMMUNITY)
Admission: EM | Admit: 2023-10-08 | Discharge: 2023-10-14 | DRG: 064 | Disposition: A | Discharge status: Home Health | Payer: Medicare Other | Attending: Internal Medicine | Admitting: Internal Medicine

## 2023-10-08 ENCOUNTER — Encounter (HOSPITAL_COMMUNITY): Payer: Self-pay | Admitting: Emergency Medicine

## 2023-10-08 ENCOUNTER — Other Ambulatory Visit: Payer: Self-pay

## 2023-10-08 DIAGNOSIS — N1832 Chronic kidney disease, stage 3b: Secondary | ICD-10-CM | POA: Diagnosis present

## 2023-10-08 DIAGNOSIS — Z83719 Family history of colon polyps, unspecified: Secondary | ICD-10-CM

## 2023-10-08 DIAGNOSIS — Z79899 Other long term (current) drug therapy: Secondary | ICD-10-CM

## 2023-10-08 DIAGNOSIS — G9341 Metabolic encephalopathy: Secondary | ICD-10-CM | POA: Diagnosis present

## 2023-10-08 DIAGNOSIS — E039 Hypothyroidism, unspecified: Secondary | ICD-10-CM | POA: Diagnosis present

## 2023-10-08 DIAGNOSIS — R4701 Aphasia: Secondary | ICD-10-CM | POA: Diagnosis present

## 2023-10-08 DIAGNOSIS — M1990 Unspecified osteoarthritis, unspecified site: Secondary | ICD-10-CM | POA: Diagnosis present

## 2023-10-08 DIAGNOSIS — R627 Adult failure to thrive: Secondary | ICD-10-CM | POA: Diagnosis not present

## 2023-10-08 DIAGNOSIS — F0390 Unspecified dementia without behavioral disturbance: Secondary | ICD-10-CM | POA: Diagnosis not present

## 2023-10-08 DIAGNOSIS — Z825 Family history of asthma and other chronic lower respiratory diseases: Secondary | ICD-10-CM

## 2023-10-08 DIAGNOSIS — I160 Hypertensive urgency: Secondary | ICD-10-CM | POA: Diagnosis present

## 2023-10-08 DIAGNOSIS — G8194 Hemiplegia, unspecified affecting left nondominant side: Secondary | ICD-10-CM | POA: Diagnosis present

## 2023-10-08 DIAGNOSIS — I6389 Other cerebral infarction: Secondary | ICD-10-CM | POA: Diagnosis not present

## 2023-10-08 DIAGNOSIS — R630 Anorexia: Principal | ICD-10-CM

## 2023-10-08 DIAGNOSIS — I639 Cerebral infarction, unspecified: Secondary | ICD-10-CM | POA: Diagnosis present

## 2023-10-08 DIAGNOSIS — R29703 NIHSS score 3: Secondary | ICD-10-CM | POA: Diagnosis present

## 2023-10-08 DIAGNOSIS — F32A Depression, unspecified: Secondary | ICD-10-CM | POA: Diagnosis present

## 2023-10-08 DIAGNOSIS — R1310 Dysphagia, unspecified: Secondary | ICD-10-CM | POA: Diagnosis present

## 2023-10-08 DIAGNOSIS — I10 Essential (primary) hypertension: Secondary | ICD-10-CM | POA: Diagnosis not present

## 2023-10-08 DIAGNOSIS — I63411 Cerebral infarction due to embolism of right middle cerebral artery: Secondary | ICD-10-CM | POA: Diagnosis present

## 2023-10-08 DIAGNOSIS — B370 Candidal stomatitis: Secondary | ICD-10-CM | POA: Diagnosis present

## 2023-10-08 DIAGNOSIS — E876 Hypokalemia: Secondary | ICD-10-CM | POA: Diagnosis present

## 2023-10-08 DIAGNOSIS — H409 Unspecified glaucoma: Secondary | ICD-10-CM | POA: Diagnosis present

## 2023-10-08 DIAGNOSIS — Z974 Presence of external hearing-aid: Secondary | ICD-10-CM

## 2023-10-08 DIAGNOSIS — I6601 Occlusion and stenosis of right middle cerebral artery: Secondary | ICD-10-CM | POA: Diagnosis not present

## 2023-10-08 DIAGNOSIS — Z961 Presence of intraocular lens: Secondary | ICD-10-CM | POA: Diagnosis present

## 2023-10-08 DIAGNOSIS — Z841 Family history of disorders of kidney and ureter: Secondary | ICD-10-CM

## 2023-10-08 DIAGNOSIS — I6340 Cerebral infarction due to embolism of unspecified cerebral artery: Secondary | ICD-10-CM | POA: Diagnosis not present

## 2023-10-08 DIAGNOSIS — N1831 Acute kidney failure, unspecified: Secondary | ICD-10-CM | POA: Diagnosis present

## 2023-10-08 DIAGNOSIS — G319 Degenerative disease of nervous system, unspecified: Secondary | ICD-10-CM | POA: Diagnosis not present

## 2023-10-08 DIAGNOSIS — Z8711 Personal history of peptic ulcer disease: Secondary | ICD-10-CM

## 2023-10-08 DIAGNOSIS — N179 Acute kidney failure, unspecified: Secondary | ICD-10-CM | POA: Diagnosis present

## 2023-10-08 DIAGNOSIS — Z9841 Cataract extraction status, right eye: Secondary | ICD-10-CM

## 2023-10-08 DIAGNOSIS — E1142 Type 2 diabetes mellitus with diabetic polyneuropathy: Secondary | ICD-10-CM | POA: Diagnosis present

## 2023-10-08 DIAGNOSIS — I129 Hypertensive chronic kidney disease with stage 1 through stage 4 chronic kidney disease, or unspecified chronic kidney disease: Secondary | ICD-10-CM | POA: Diagnosis present

## 2023-10-08 DIAGNOSIS — R40244 Other coma, without documented Glasgow coma scale score, or with partial score reported, unspecified time: Secondary | ICD-10-CM | POA: Diagnosis not present

## 2023-10-08 DIAGNOSIS — E1165 Type 2 diabetes mellitus with hyperglycemia: Secondary | ICD-10-CM | POA: Diagnosis present

## 2023-10-08 DIAGNOSIS — Z90710 Acquired absence of both cervix and uterus: Secondary | ICD-10-CM

## 2023-10-08 DIAGNOSIS — Z79890 Hormone replacement therapy (postmenopausal): Secondary | ICD-10-CM

## 2023-10-08 DIAGNOSIS — Z7982 Long term (current) use of aspirin: Secondary | ICD-10-CM

## 2023-10-08 DIAGNOSIS — R0989 Other specified symptoms and signs involving the circulatory and respiratory systems: Secondary | ICD-10-CM | POA: Diagnosis not present

## 2023-10-08 DIAGNOSIS — Z823 Family history of stroke: Secondary | ICD-10-CM

## 2023-10-08 DIAGNOSIS — R4182 Altered mental status, unspecified: Secondary | ICD-10-CM | POA: Diagnosis present

## 2023-10-08 DIAGNOSIS — Z7902 Long term (current) use of antithrombotics/antiplatelets: Secondary | ICD-10-CM

## 2023-10-08 DIAGNOSIS — Z888 Allergy status to other drugs, medicaments and biological substances status: Secondary | ICD-10-CM

## 2023-10-08 DIAGNOSIS — Z794 Long term (current) use of insulin: Secondary | ICD-10-CM

## 2023-10-08 DIAGNOSIS — Z9842 Cataract extraction status, left eye: Secondary | ICD-10-CM

## 2023-10-08 DIAGNOSIS — Z91148 Patient's other noncompliance with medication regimen for other reason: Secondary | ICD-10-CM

## 2023-10-08 DIAGNOSIS — R32 Unspecified urinary incontinence: Secondary | ICD-10-CM | POA: Diagnosis present

## 2023-10-08 DIAGNOSIS — E7800 Pure hypercholesterolemia, unspecified: Secondary | ICD-10-CM | POA: Diagnosis present

## 2023-10-08 DIAGNOSIS — I6523 Occlusion and stenosis of bilateral carotid arteries: Secondary | ICD-10-CM | POA: Diagnosis not present

## 2023-10-08 DIAGNOSIS — J4520 Mild intermittent asthma, uncomplicated: Secondary | ICD-10-CM | POA: Diagnosis present

## 2023-10-08 DIAGNOSIS — Z8249 Family history of ischemic heart disease and other diseases of the circulatory system: Secondary | ICD-10-CM

## 2023-10-08 DIAGNOSIS — Z860100 Personal history of colon polyps, unspecified: Secondary | ICD-10-CM

## 2023-10-08 DIAGNOSIS — Z8673 Personal history of transient ischemic attack (TIA), and cerebral infarction without residual deficits: Secondary | ICD-10-CM

## 2023-10-08 DIAGNOSIS — I6310 Cerebral infarction due to embolism of unspecified precerebral artery: Secondary | ICD-10-CM | POA: Diagnosis not present

## 2023-10-08 DIAGNOSIS — R29810 Facial weakness: Secondary | ICD-10-CM | POA: Diagnosis present

## 2023-10-08 DIAGNOSIS — E1122 Type 2 diabetes mellitus with diabetic chronic kidney disease: Secondary | ICD-10-CM | POA: Diagnosis present

## 2023-10-08 DIAGNOSIS — Z833 Family history of diabetes mellitus: Secondary | ICD-10-CM

## 2023-10-08 DIAGNOSIS — E860 Dehydration: Secondary | ICD-10-CM | POA: Diagnosis present

## 2023-10-08 DIAGNOSIS — R4189 Other symptoms and signs involving cognitive functions and awareness: Secondary | ICD-10-CM | POA: Diagnosis present

## 2023-10-08 DIAGNOSIS — Z91018 Allergy to other foods: Secondary | ICD-10-CM

## 2023-10-08 DIAGNOSIS — I2510 Atherosclerotic heart disease of native coronary artery without angina pectoris: Secondary | ICD-10-CM | POA: Diagnosis present

## 2023-10-08 DIAGNOSIS — R918 Other nonspecific abnormal finding of lung field: Secondary | ICD-10-CM | POA: Diagnosis not present

## 2023-10-08 DIAGNOSIS — I6381 Other cerebral infarction due to occlusion or stenosis of small artery: Secondary | ICD-10-CM | POA: Diagnosis not present

## 2023-10-08 DIAGNOSIS — Z881 Allergy status to other antibiotic agents status: Secondary | ICD-10-CM

## 2023-10-08 DIAGNOSIS — Z91040 Latex allergy status: Secondary | ICD-10-CM

## 2023-10-08 DIAGNOSIS — G238 Other specified degenerative diseases of basal ganglia: Secondary | ICD-10-CM | POA: Diagnosis not present

## 2023-10-08 LAB — COMPREHENSIVE METABOLIC PANEL
ALT: 16 U/L (ref 0–44)
AST: 17 U/L (ref 15–41)
Albumin: 3.6 g/dL (ref 3.5–5.0)
Alkaline Phosphatase: 59 U/L (ref 38–126)
Anion gap: 9 (ref 5–15)
BUN: 25 mg/dL — ABNORMAL HIGH (ref 8–23)
CO2: 27 mmol/L (ref 22–32)
Calcium: 9.3 mg/dL (ref 8.9–10.3)
Chloride: 105 mmol/L (ref 98–111)
Creatinine, Ser: 1.89 mg/dL — ABNORMAL HIGH (ref 0.44–1.00)
GFR, Estimated: 26 mL/min — ABNORMAL LOW (ref >60)
Glucose, Bld: 121 mg/dL — ABNORMAL HIGH (ref 70–99)
Potassium: 3.3 mmol/L — ABNORMAL LOW (ref 3.5–5.1)
Sodium: 141 mmol/L (ref 135–145)
Total Bilirubin: 1 mg/dL (ref <1.2)
Total Protein: 7 g/dL (ref 6.5–8.1)

## 2023-10-08 LAB — URINALYSIS, ROUTINE W REFLEX MICROSCOPIC
Bacteria, UA: NONE SEEN
Bilirubin Urine: NEGATIVE
Glucose, UA: 50 mg/dL — AB
Hgb urine dipstick: NEGATIVE
Ketones, ur: NEGATIVE mg/dL
Leukocytes,Ua: NEGATIVE
Nitrite: NEGATIVE
Protein, ur: >=300 mg/dL — AB
Specific Gravity, Urine: 1.016 (ref 1.005–1.030)
pH: 5 (ref 5.0–8.0)

## 2023-10-08 LAB — CBC
HCT: 31.3 % — ABNORMAL LOW (ref 36.0–46.0)
Hemoglobin: 10.4 g/dL — ABNORMAL LOW (ref 12.0–15.0)
MCH: 29 pg (ref 26.0–34.0)
MCHC: 33.2 g/dL (ref 30.0–36.0)
MCV: 87.2 fL (ref 80.0–100.0)
Platelets: 215 10*3/uL (ref 150–400)
RBC: 3.59 MIL/uL — ABNORMAL LOW (ref 3.87–5.11)
RDW: 13.2 % (ref 11.5–15.5)
WBC: 6.3 10*3/uL (ref 4.0–10.5)
nRBC: 0 % (ref 0.0–0.2)

## 2023-10-08 LAB — CBC WITH DIFFERENTIAL/PLATELET
Abs Immature Granulocytes: 0.03 10*3/uL (ref 0.00–0.07)
Basophils Absolute: 0 10*3/uL (ref 0.0–0.1)
Basophils Relative: 1 %
Eosinophils Absolute: 0.1 10*3/uL (ref 0.0–0.5)
Eosinophils Relative: 1 %
HCT: 34 % — ABNORMAL LOW (ref 36.0–46.0)
Hemoglobin: 11.7 g/dL — ABNORMAL LOW (ref 12.0–15.0)
Immature Granulocytes: 0 %
Lymphocytes Relative: 33 %
Lymphs Abs: 2.2 10*3/uL (ref 0.7–4.0)
MCH: 29.5 pg (ref 26.0–34.0)
MCHC: 34.4 g/dL (ref 30.0–36.0)
MCV: 85.9 fL (ref 80.0–100.0)
Monocytes Absolute: 0.4 10*3/uL (ref 0.1–1.0)
Monocytes Relative: 6 %
Neutro Abs: 4 10*3/uL (ref 1.7–7.7)
Neutrophils Relative %: 59 %
Platelets: 219 10*3/uL (ref 150–400)
RBC: 3.96 MIL/uL (ref 3.87–5.11)
RDW: 13 % (ref 11.5–15.5)
WBC: 6.7 10*3/uL (ref 4.0–10.5)
nRBC: 0 % (ref 0.0–0.2)

## 2023-10-08 LAB — HEMOGLOBIN A1C
Hgb A1c MFr Bld: 6.1 % — ABNORMAL HIGH (ref 4.8–5.6)
Mean Plasma Glucose: 128.37 mg/dL

## 2023-10-08 LAB — CREATININE, SERUM
Creatinine, Ser: 1.81 mg/dL — ABNORMAL HIGH (ref 0.44–1.00)
GFR, Estimated: 27 mL/min — ABNORMAL LOW (ref >60)

## 2023-10-08 LAB — GLUCOSE, CAPILLARY: Glucose-Capillary: 125 mg/dL — ABNORMAL HIGH (ref 70–99)

## 2023-10-08 LAB — TSH: TSH: 0.792 u[IU]/mL (ref 0.350–4.500)

## 2023-10-08 LAB — AMMONIA: Ammonia: 16 umol/L (ref 9–35)

## 2023-10-08 LAB — TROPONIN I (HIGH SENSITIVITY): Troponin I (High Sensitivity): 17 ng/L (ref <18)

## 2023-10-08 MED ORDER — SODIUM CHLORIDE 0.9 % IV SOLN: INTRAVENOUS | Status: AC

## 2023-10-08 MED ORDER — HYDRALAZINE HCL 20 MG/ML IJ SOLN
5.0000 mg | Freq: Once | INTRAMUSCULAR | Status: DC | PRN
  Filled 2023-10-08: qty 1

## 2023-10-08 MED ORDER — INSULIN ASPART 100 UNIT/ML IJ SOLN
0.0000 [IU] | Freq: Three times a day (TID) | INTRAMUSCULAR | Status: DC
  Administered 2023-10-11 (×2): 2 [IU] via SUBCUTANEOUS
  Administered 2023-10-12: 5 [IU] via SUBCUTANEOUS
  Administered 2023-10-13 – 2023-10-14 (×2): 2 [IU] via SUBCUTANEOUS
  Filled 2023-10-08: qty 0.15

## 2023-10-08 MED ORDER — SENNOSIDES-DOCUSATE SODIUM 8.6-50 MG PO TABS: 1.0000 | ORAL_TABLET | Freq: Every evening | ORAL | Status: DC | PRN

## 2023-10-08 MED ORDER — ALBUTEROL SULFATE (2.5 MG/3ML) 0.083% IN NEBU: 3.0000 mL | INHALATION_SOLUTION | RESPIRATORY_TRACT | Status: DC | PRN

## 2023-10-08 MED ORDER — SERTRALINE HCL 25 MG PO TABS
150.0000 mg | ORAL_TABLET | Freq: Every day | ORAL | Status: DC
  Administered 2023-10-09 – 2023-10-13 (×5): 150 mg via ORAL
  Filled 2023-10-08: qty 2
  Filled 2023-10-08: qty 3
  Filled 2023-10-08 (×2): qty 2
  Filled 2023-10-08: qty 3

## 2023-10-08 MED ORDER — ENOXAPARIN SODIUM 30 MG/0.3ML IJ SOSY
30.0000 mg | PREFILLED_SYRINGE | INTRAMUSCULAR | Status: DC
  Administered 2023-10-08 – 2023-10-13 (×6): 30 mg via SUBCUTANEOUS
  Filled 2023-10-08 (×6): qty 0.3

## 2023-10-08 MED ORDER — LEVOTHYROXINE SODIUM 88 MCG PO TABS
88.0000 ug | ORAL_TABLET | Freq: Every day | ORAL | Status: DC
  Administered 2023-10-10 – 2023-10-14 (×5): 88 ug via ORAL
  Filled 2023-10-08 (×5): qty 1

## 2023-10-08 MED ORDER — SODIUM CHLORIDE 0.9 % IV SOLN: INTRAVENOUS | Status: DC

## 2023-10-08 MED ORDER — THIAMINE MONONITRATE 100 MG PO TABS
100.0000 mg | ORAL_TABLET | Freq: Every day | ORAL | Status: DC
  Administered 2023-10-08: 100 mg via ORAL
  Filled 2023-10-08: qty 1

## 2023-10-08 MED ORDER — ACETAMINOPHEN 160 MG/5ML PO SOLN: 650.0000 mg | ORAL | Status: DC | PRN

## 2023-10-08 MED ORDER — CLOPIDOGREL BISULFATE 75 MG PO TABS
75.0000 mg | ORAL_TABLET | Freq: Every day | ORAL | Status: DC
  Administered 2023-10-08 – 2023-10-10 (×2): 75 mg via ORAL
  Filled 2023-10-08 (×3): qty 1

## 2023-10-08 MED ORDER — ACETAMINOPHEN 650 MG RE SUPP: 650.0000 mg | RECTAL | Status: DC | PRN

## 2023-10-08 MED ORDER — CARVEDILOL 3.125 MG PO TABS
6.2500 mg | ORAL_TABLET | Freq: Two times a day (BID) | ORAL | Status: DC
  Administered 2023-10-08: 6.25 mg via ORAL
  Filled 2023-10-08: qty 2

## 2023-10-08 MED ORDER — VITAMIN D 25 MCG (1000 UNIT) PO TABS
1000.0000 [IU] | ORAL_TABLET | Freq: Every day | ORAL | Status: DC
  Administered 2023-10-08: 1000 [IU] via ORAL
  Filled 2023-10-08: qty 1

## 2023-10-08 MED ORDER — STROKE: EARLY STAGES OF RECOVERY BOOK
Freq: Once | Status: AC
  Filled 2023-10-08: qty 1

## 2023-10-08 MED ORDER — ATORVASTATIN CALCIUM 40 MG PO TABS
40.0000 mg | ORAL_TABLET | Freq: Every day | ORAL | Status: DC
  Administered 2023-10-08: 40 mg via ORAL
  Filled 2023-10-08: qty 1

## 2023-10-08 MED ORDER — ACETAMINOPHEN 325 MG PO TABS: 650.0000 mg | ORAL_TABLET | ORAL | Status: DC | PRN

## 2023-10-08 MED ORDER — INSULIN ASPART 100 UNIT/ML IJ SOLN
0.0000 [IU] | Freq: Every day | INTRAMUSCULAR | Status: DC
  Filled 2023-10-08: qty 0.05

## 2023-10-08 NOTE — Plan of Care (Signed)
  Problem: Education: Goal: Knowledge of disease or condition will improve Outcome: Progressing Goal: Knowledge of secondary prevention will improve (MUST DOCUMENT ALL) Outcome: Progressing Goal: Knowledge of patient specific risk factors will improve (Mark N/A or DELETE if not current risk factor) Outcome: Progressing   Problem: Ischemic Stroke/TIA Tissue Perfusion: Goal: Complications of ischemic stroke/TIA will be minimized Outcome: Progressing   

## 2023-10-08 NOTE — H&P (Signed)
History and Physical    Diana Harper DGU:[id number] DOB: 07/15/1936 DOA: 10/08/2023  PCP: Lewis Moccasin, MD  Patient coming from: home  I have personally briefly reviewed patient's old medical records in [facility]  Chief Complaint: altered mental status   HPI: Diana Harper is a 87 y.o. female with medical history significant of multiple medical problems including CKD stage IIIa, asthma, hyperlipidemia, hypertension, CAD was brought into the emergency department with decreased appetite, weakness, altered mental status and slurred speech.  History was mostly obtained from the daughter who is at the bedside.  Patient has dementia and typically is only oriented to herself, her date of birth and family members around her.  Reportedly, since Wednesday, patient has been moving less often, has been speaking less often, has been eating less often as well.  She has been noted to have some more confusion than her baseline based on the dementia.  For all those reasons, she was brought into the emergency department.  No reports of chest pain, shortness of breath, cough, fever, chills, headache or any other complaint.  ED Course: Upon arrival to ED, she was significantly hypertensive.  Initially, she was not following commands.  However when I saw her, patient was able to follow commands and according to daughter, patient has improved significantly over the course of last 2 to 3 hours that she has been in the ED.  She is now recognizing her daughter at the bedside and cannot tell me her name.  Her speech is slightly more fluent compared to earlier although not back to baseline.  She had mild AKI on CKD stage IIIa with mild hypokalemia.  CT head and chest x-ray unremarkable.  Hospitalist were called for further admission.  Review of Systems: As per HPI otherwise negative.    Past Medical History:  Diagnosis Date   Anemia    Anxiety    Arthritis    Asthma    Biliary colic    C. difficile  colitis 08/2022   Cholelithiasis    CKD (chronic kidney disease), stage III [facility])    nephrologist--  dr Reynolds Bowl   Colon polyps    Diverticulosis of colon    Gastric ulcer    Glaucoma    History of asthma    1980's  no longer problem since 1980's   History of diverticulitis of colon    recurrent--  2014;  2013;  2012   Hyperlipidemia    Hypertension    Hypothyroidism    Insulin dependent type 2 diabetes mellitus (HCC)    followed by dr Jonny Ruiz lambeth ([facility])    Murmur    Peripheral neuropathy    Stroke [facility])    Vitamin D deficiency    Wears hearing aid    bilateral   Wears partial dentures    lower partial and upper full    Past Surgical History:  Procedure Laterality Date   ABDOMINAL HYSTERECTOMY  1970's   CARDIOVASCULAR STRESS TEST  07/30/2009   normal exercise lexiscan nuclear study w/ no ischemia/  normal LV funciton and wall motion, ef 77%   CATARACT EXTRACTION W/ INTRAOCULAR LENS  IMPLANT, BILATERAL  2014   LAPAROSCOPIC CHOLECYSTECTOMY SINGLE SITE WITH INTRAOPERATIVE CHOLANGIOGRAM N/A 08/25/2016   Procedure: LAPAROSCOPIC CHOLECYSTECTOMY WITH INTRAOPERATIVE CHOLANGIOGRAM;  Surgeon: Rodman Pickle, MD;  Location: [facility];  Service: General;  Laterality: N/A;   LUMBAR SPINE SURGERY  2014   REMOVAL AXILLA CYST Right 1990's     reports that  she has never smoked. She has never used smokeless tobacco. She reports that she does not drink alcohol and does not use drugs.  Allergies  Allergen Reactions   Latex Anaphylaxis, Shortness Of Breath and Other (See Comments)    Severe respiratory distress   Onion Diarrhea   Other Other (See Comments)    Patient cannot have TREE NUTS and anything with seeds- History of  diverticulitis    Flexeril [Cyclobenzaprine] Other (See Comments)    Caused agitation   Lisinopril Cough   Metformin And Related Diarrhea   Simvastatin Hives and Rash   Tetracycline Rash   Zithromax [Azithromycin] Rash    Family  History  Problem Relation Age of Onset   Stroke Mother    Colon polyps Mother    Diabetes Mother    Diabetes Father    Kidney failure Father    COPD Father    Colon polyps Sister    Diabetes Sister    Stroke Brother    Diabetes Brother    Heart attack Maternal Grandmother    Heart disease Maternal Grandfather    Diabetes Paternal Grandmother    Stroke Paternal Grandfather    Hypertension Paternal Grandfather    Diabetes Sister    Diabetes Sister    Diabetes Son    Diabetes Daughter    Colon cancer Neg Hx    Esophageal cancer Neg Hx    Inflammatory bowel disease Neg Hx    Liver disease Neg Hx    Pancreatic cancer Neg Hx    Rectal cancer Neg Hx    Stomach cancer Neg Hx     Prior to Admission medications   Medication Sig Start Date End Date Taking? Authorizing Provider  acetaminophen (TYLENOL) 500 MG tablet Take 1,000 mg by mouth every 8 (eight) hours as needed for mild pain, headache or fever.    [provider]  albuterol (VENTOLIN HFA) 108 (90 Base) MCG/ACT inhaler Inhale 2 puffs into the lungs every 4 (four) hours as needed for wheezing or shortness of breath.    [provider]  atorvastatin (LIPITOR) 40 MG tablet TAKE 1 TABLET BY MOUTH ONCE DAILY 05/04/23   Yates Decamp, MD  carvedilol (COREG) 6.25 MG tablet Take 6.25 mg by mouth 2 (two) times daily with a meal.    [provider]  Cholecalciferol (VITAMIN D3) 25 MCG (1000 UT) CAPS Take 1,000 Units by mouth daily.    [provider]  clopidogrel (PLAVIX) 75 MG tablet Take 1 tablet (75 mg total) by mouth daily. 04/20/23   Marguerita Merles Latif, DO  Continuous Blood Gluc Receiver (FREESTYLE LIBRE 2 READER) DEVI See admin instructions. 10/13/22   [provider]  Continuous Blood Gluc Sensor (FREESTYLE LIBRE 14 DAY SENSOR) MISC Inject 1 Device into the skin every 14 (fourteen) days.    [provider]  diclofenac Sodium (VOLTAREN) 1 % GEL Apply 2 g topically as needed.     [provider]  diclofenac Sodium (VOLTAREN) 1 % GEL Apply 4 g topically as needed.    [provider]  Glucagon HCl 1 MG SOLR Inject 1 mg into the skin as needed (for a blood sugar <67).    [provider]  insulin degludec (TRESIBA) 200 UNIT/ML FlexTouch Pen Inject 10 Units into the skin See admin instructions. Patient taking differently: Inject 15 Units into the skin See admin instructions. On 6/10 will go up to 20 units 04/21/23   Almon Hercules, MD  levothyroxine (SYNTHROID) 88  MCG tablet Take 88 mcg by mouth daily before breakfast.    [provider]  nitroGLYCERIN (NITROSTAT) 0.4 MG SL tablet Place 1 tablet (0.4 mg total) under the tongue every 5 (five) minutes as needed for chest pain. 01/20/21 05/04/23  Cantwell, Celeste C, PA-C  NOVOLOG FLEXPEN 100 UNIT/ML FlexPen Inject 5-10 Units into the skin See admin instructions. Inject 5 units into the skin before meals if BGL is 100-150, 7 units if BGL is 151-200, and 10 units if BGL is greater than 200 05/21/22   [provider]  sertraline (ZOLOFT) 50 MG tablet Take 150 mg by mouth at bedtime.    [provider]  thiamine (VITAMIN B1) 100 MG tablet Take 1 tablet (100 mg total) by mouth daily. 09/15/22   Marinda Elk, MD    Physical Exam: Vitals:   10/08/23 1001 10/08/23 1140 10/08/23 1330  BP: (!) 204/82 (!) 183/88 (!) 178/88  Pulse: 63 68 66  Resp: 18 18 18   Temp: 99.1 F (37.3 C)  98.9 F (37.2 C)  TempSrc: Oral    SpO2: 98% 99% 98%    Constitutional: NAD, calm, comfortable Vitals:   10/08/23 1001 10/08/23 1140 10/08/23 1330  BP: (!) 204/82 (!) 183/88 (!) 178/88  Pulse: 63 68 66  Resp: 18 18 18   Temp: 99.1 F (37.3 C)  98.9 F (37.2 C)  TempSrc: Oral    SpO2: 98% 99% 98%   Eyes: PERRL, lids and conjunctivae normal ENMT: Mucous membranes are. Posterior pharynx clear of any exudate or lesions.Normal dentition.  Neck: normal, supple, no masses, no  thyromegaly Respiratory: clear to auscultation bilaterally, no wheezing, no crackles. Normal respiratory effort. No accessory muscle use.  Cardiovascular: Regular rate and rhythm, no murmurs / rubs / gallops. No extremity edema. 2+ pedal pulses. No carotid bruits.  Abdomen: no tenderness, no masses palpated. No hepatosplenomegaly. Bowel sounds positive.  Musculoskeletal: no clubbing / cyanosis. No joint deformity upper and lower extremities. Good ROM, no contractures. Normal muscle tone.  Skin: no rashes, lesions, ulcers. No induration Neurologic: CN 2-12 grossly intact. Sensation intact, DTR normal. Strength 5/5 in all   Labs on Admission: I have personally reviewed following labs and imaging studies  CBC: Recent Labs  Lab 10/08/23 1328  WBC 6.7  NEUTROABS 4.0  HGB 11.7*  HCT 34.0*  MCV 85.9  PLT 219   Basic Metabolic Panel: Recent Labs  Lab 10/08/23 1328  NA 141  K 3.3*  CL 105  CO2 27  GLUCOSE 121*  BUN 25*  CREATININE 1.89*  CALCIUM 9.3   GFR: CrCl cannot be calculated (Unknown ideal weight.). Liver Function Tests: Recent Labs  Lab 10/08/23 1328  AST 17  ALT 16  ALKPHOS 59  BILITOT 1.0  PROT 7.0  ALBUMIN 3.6   No results for input(s): "LIPASE", "AMYLASE" in the last 168 hours. Recent Labs  Lab 10/08/23 1328  AMMONIA 16   Coagulation Profile: No results for input(s): "INR", "PROTIME" in the last 168 hours. Cardiac Enzymes: No results for input(s): "CKTOTAL", "CKMB", "CKMBINDEX", "TROPONINI" in the last 168 hours. BNP (last 3 results) No results for input(s): "PROBNP" in the last 8760 hours. HbA1C: No results for input(s): "HGBA1C" in the last 72 hours. CBG: No results for input(s): "GLUCAP" in the last 168 hours. Lipid Profile: No results for input(s): "CHOL", "HDL", "LDLCALC", "TRIG", "CHOLHDL", "LDLDIRECT" in the last 72 hours. Thyroid Function Tests: Recent Labs    10/08/23 1530  TSH 0.792   Anemia Panel:  No results for input(s):  "VITAMINB12", "FOLATE", "FERRITIN", "TIBC", "IRON", "RETICCTPCT" in the last 72 hours. Urine analysis:    Component Value Date/Time   COLORURINE YELLOW 10/08/2023 1306   APPEARANCEUR CLEAR 10/08/2023 1306   LABSPEC 1.016 10/08/2023 1306   PHURINE 5.0 10/08/2023 1306   GLUCOSEU 50 (A) 10/08/2023 1306   HGBUR NEGATIVE 10/08/2023 1306   BILIRUBINUR NEGATIVE 10/08/2023 1306   KETONESUR NEGATIVE 10/08/2023 1306   PROTEINUR >=300 (A) 10/08/2023 1306   UROBILINOGEN 0.2 04/15/2012 1217   NITRITE NEGATIVE 10/08/2023 1306   LEUKOCYTESUR NEGATIVE 10/08/2023 1306    Radiological Exams on Admission: DG Chest 2 View  Result Date: 10/08/2023 CLINICAL DATA:  Altered mental status. EXAM: CHEST - 2 VIEW COMPARISON:  12/30/2022 FINDINGS: The cardio pericardial silhouette is enlarged. There is pulmonary vascular congestion. Mild interstitial pulmonary edema not excluded. No focal consolidation or substantial pleural effusion. No acute bony abnormality. IMPRESSION: Enlargement of the cardiopericardial silhouette with pulmonary vascular congestion. Mild interstitial pulmonary edema not excluded. Electronically Signed   By: Kennith Center M.D.   On: 10/08/2023 12:51   CT Head Wo Contrast  Result Date: 10/08/2023 CLINICAL DATA:  87 year old female with altered mental status, decreased p.o. intake. EXAM: CT HEAD WITHOUT CONTRAST TECHNIQUE: Contiguous axial images were obtained from the base of the skull through the vertex without intravenous contrast. RADIATION DOSE REDUCTION: This exam was performed according to the departmental dose-optimization program which includes automated exposure control, adjustment of the mA and/or kV according to patient size and/or use of iterative reconstruction technique. COMPARISON:  Head CT 09/09/2023 and earlier. FINDINGS: Brain: Stable cerebral volume. No midline shift, ventriculomegaly, mass effect, evidence of mass lesion, intracranial hemorrhage or evidence of cortically based  acute infarction. Confluent bilateral cerebral white matter hypodensity, chronic cortical encephalomalacia in the left parietal lobe. Chronic right caudate and smaller thalamic lacunar infarcts. Stable gray-white matter differentiation throughout the brain. Vascular: Calcified atherosclerosis at the skull base. No suspicious intracranial vascular hyperdensity. Calcified basal ganglia vascular calcifications. Skull: Intact, negative. Sinuses/Orbits: Visualized paranasal sinuses and mastoids are stable and well aerated. Other: No acute orbit or scalp soft tissue finding. IMPRESSION: 1. No acute intracranial abnormality. 2. Stable non contrast CT appearance of advanced chronic ischemic disease. Electronically Signed   By: Odessa Fleming M.D.   On: 10/08/2023 12:19    EKG: Independently reviewed.  Normal sinus rhythm with no acute ST-T wave changes  Assessment/Plan Principal Problem:   AMS (altered mental status) Active Problems:   Acute renal failure superimposed on stage 3a chronic kidney disease (HCC)   Hypothyroidism   Uncontrolled type 2 diabetes mellitus with hyperglycemia, without long-term current use of insulin (HCC)   Hypokalemia   Hypertensive urgency   Dementia without behavioral disturbance (HCC)   Acute metabolic encephalopathy: Likely secondary to dehydration.  CT head negative.  She is now getting better, following commands.  Does not appear to have focal deficit.  I will hydrate her overnight and reassess tomorrow morning.  If she would have any deficit, we will consider MRI to rule out stroke.  Monitor on telemetry in the meantime.  Will check bedside swallow evaluation before starting her on diet.  Dehydration/acute on chronic kidney disease stage III: I will start her on normal saline at 125 cc/h until morning.  Repeat labs in the morning.  Hypertensive urgency: Will resume her home medications/Coreg and monitor closely.  Add as needed hydralazine.  Hypokalemia: Will  replenish.  Acquired hypothyroidism: Resume Synthroid.  Hyperlipidemia: Resume statin.  Asthma, mild intermittent: Completely asymptomatic.  Resume albuterol inhaler.  History of CAD: Resume Plavix.  She is asymptomatic.  History of depression: Resume Zoloft.  Type 2 diabetes mellitus: Recent hemoglobin A1c in chart was 7.7.  She does not appear to be on medications for this.  Will check hemoglobin A1c and start on SSI.  DVT prophylaxis: enoxaparin (LOVENOX) injection 40 mg Start: 10/08/23 1645 Code Status: Full code-confirmed with daughter Family Communication: Daughter present at bedside.  Plan of care discussed with patient in length and he verbalized understanding and agreed with it. Disposition Plan: Potential discharge tomorrow. Consults called: None  Hughie Closs MD [facility]  *Please note that this is a verbal dictation therefore any spelling or grammatical errors are due to the "[employer] One" system interpretation.  Please page via Amion and do not message via secure chat for urgent patient care matters. Secure chat can be used for non urgent patient care matters. 10/08/2023, 4:34 PM  To contact the attending provider between 7A-7P or the covering provider during after hours 7P-7A, please log into the web site [email or web address]

## 2023-10-08 NOTE — ED Triage Notes (Signed)
EMS reports from home. Poor oral intake x 1 week per daughter. AMS since Wednesday. Patient poor historian. Hx of dementia. Pt normally ambulatory at baseline change since Wednesday.  BP 158/60 HR 68 RR 14 Temp 97.5

## 2023-10-08 NOTE — ED Provider Notes (Signed)
Diana Harper Provider Note   CSN: [id number] Arrival date & time: 10/08/23  5643     History  Chief Complaint  Patient presents with   Failure To Thrive    Diana Harper is a 87 y.o. female with past medical history of type 2 diabetes, CKD, hypothyroidism, anemia presents emergency department for evaluation of decreased appetite and altered mentation since Wednesday 10/05/23. She is incontinent and wears a diaper on daily basis.  Patient's daughter at bedside, reports that she may have had a facial droop on Wednesday and had a "couple of falls" last month.  She reports that patient is not speaking more than 1 word and does not make sense.  Patient is normally able to hold a conversation at baseline.  Today, patient is occasionally able to answer questions and follow commands.  HPI     Home Medications Prior to Admission medications   Medication Sig Start Date End Date Taking? Authorizing Provider  acetaminophen (TYLENOL) 500 MG tablet Take 1,000 mg by mouth every 8 (eight) hours as needed for mild pain, headache or fever.    [provider]  albuterol (VENTOLIN HFA) 108 (90 Base) MCG/ACT inhaler Inhale 2 puffs into the lungs every 4 (four) hours as needed for wheezing or shortness of breath.    [provider]  atorvastatin (LIPITOR) 40 MG tablet TAKE 1 TABLET BY MOUTH ONCE DAILY 05/04/23   Yates Decamp, MD  carvedilol (COREG) 6.25 MG tablet Take 6.25 mg by mouth 2 (two) times daily with a meal.    [provider]  Cholecalciferol (VITAMIN D3) 25 MCG (1000 UT) CAPS Take 1,000 Units by mouth daily.    [provider]  clopidogrel (PLAVIX) 75 MG tablet Take 1 tablet (75 mg total) by mouth daily. 04/20/23   Marguerita Merles Latif, DO  Continuous Blood Gluc Receiver (FREESTYLE LIBRE 2 READER) DEVI See admin instructions. 10/13/22   [provider]  Continuous Blood Gluc Sensor (FREESTYLE LIBRE 14 DAY  SENSOR) MISC Inject 1 Device into the skin every 14 (fourteen) days.    [provider]  diclofenac Sodium (VOLTAREN) 1 % GEL Apply 2 g topically as needed.    [provider]  diclofenac Sodium (VOLTAREN) 1 % GEL Apply 4 g topically as needed.    [provider]  Glucagon HCl 1 MG SOLR Inject 1 mg into the skin as needed (for a blood sugar <67).    [provider]  insulin degludec (TRESIBA) 200 UNIT/ML FlexTouch Pen Inject 10 Units into the skin See admin instructions. Patient taking differently: Inject 15 Units into the skin See admin instructions. On 6/10 will go up to 20 units 04/21/23   Almon Hercules, MD  levothyroxine (SYNTHROID) 88 MCG tablet Take 88 mcg by mouth daily before breakfast.    [provider]  nitroGLYCERIN (NITROSTAT) 0.4 MG SL tablet Place 1 tablet (0.4 mg total) under the tongue every 5 (five) minutes as needed for chest pain. 01/20/21 05/04/23  Cantwell, Celeste C, PA-C  NOVOLOG FLEXPEN 100 UNIT/ML FlexPen Inject 5-10 Units into the skin See admin instructions. Inject 5 units into the skin before meals if BGL is 100-150, 7 units if BGL is 151-200, and 10 units if BGL is greater than 200 05/21/22   [provider]  sertraline (ZOLOFT) 50 MG tablet Take 150 mg by mouth at bedtime.    [provider]  thiamine (VITAMIN B1) 100 MG tablet Take 1  tablet (100 mg total) by mouth daily. 09/15/22   Marinda Elk, MD      Allergies    Latex, Onion, Other, Flexeril [cyclobenzaprine], Lisinopril, Metformin and related, Simvastatin, Tetracycline, and Zithromax [azithromycin]    Review of Systems   Review of Systems  Constitutional:  Negative for chills, fatigue and fever.  Respiratory:  Negative for cough, chest tightness, shortness of breath and wheezing.   Cardiovascular:  Negative for chest pain and palpitations.  Gastrointestinal:  Negative for abdominal pain, constipation, diarrhea, nausea and vomiting.   Neurological:  Negative for dizziness, seizures, weakness, light-headedness, numbness and headaches.  Psychiatric/Behavioral:  Positive for confusion.     Physical Exam Updated Vital Signs BP (!) 178/88   Pulse 66   Temp 98.9 F (37.2 C)   Resp 18   SpO2 98%  Physical Exam Vitals and nursing note reviewed.  Constitutional:      General: She is not in acute distress.    Appearance: Normal appearance.  HENT:     Head: Normocephalic and atraumatic.  Eyes:     Conjunctiva/sclera: Conjunctivae normal.  Cardiovascular:     Rate and Rhythm: Normal rate.     Pulses: Normal pulses.     Comments: Radial pulse 2+ bilaterally Dorsalis pedis pulses 2+ bilaterally Pulmonary:     Effort: Pulmonary effort is normal. No respiratory distress.     Breath sounds: Normal breath sounds.  Skin:    Capillary Refill: Capillary refill takes less than 2 seconds.     Coloration: Skin is not jaundiced or pale.  Neurological:     Mental Status: She is alert.     GCS: GCS eye subscore is 4. GCS verbal subscore is 4. GCS motor subscore is 5.     Comments: Difficulty with obtaining neuro exam d/t patient condition Occasionally answers questions and follows commands Is bed bound at home and unable to ambulate on own     ED Results / Procedures / Treatments   Labs (all labs ordered are listed, but only abnormal results are displayed) Labs Reviewed  CBC WITH DIFFERENTIAL/PLATELET - Abnormal; Notable for the following components:      Result Value   Hemoglobin 11.7 (*)    HCT 34.0 (*)    All other components within normal limits  COMPREHENSIVE METABOLIC PANEL - Abnormal; Notable for the following components:   Potassium 3.3 (*)    Glucose, Bld 121 (*)    BUN 25 (*)    Creatinine, Ser 1.89 (*)    GFR, Estimated 26 (*)    All other components within normal limits  URINALYSIS, ROUTINE W REFLEX MICROSCOPIC - Abnormal; Notable for the following components:   Glucose, UA 50 (*)    Protein, ur >=300  (*)    All other components within normal limits  AMMONIA  TSH  TROPONIN I (HIGH SENSITIVITY)  TROPONIN I (HIGH SENSITIVITY)    EKG EKG Interpretation Date/Time:  Saturday October 08 2023 10:06:55 EST Ventricular Rate:  67 PR Interval:  150 QRS Duration:  82 QT Interval:  412 QTC Calculation: 435 R Axis:   20  Text Interpretation: Normal sinus rhythm Cannot rule out Anterior infarct , age undetermined Abnormal ECG Confirmed by Vonita Moss [phone]) on 10/08/2023 11:06:22 AM  Radiology DG Chest 2 View  Result Date: 10/08/2023 CLINICAL DATA:  Altered mental status. EXAM: CHEST - 2 VIEW COMPARISON:  12/30/2022 FINDINGS: The cardio pericardial silhouette is enlarged. There is pulmonary vascular congestion. Mild interstitial pulmonary edema not excluded.  No focal consolidation or substantial pleural effusion. No acute bony abnormality. IMPRESSION: Enlargement of the cardiopericardial silhouette with pulmonary vascular congestion. Mild interstitial pulmonary edema not excluded. Electronically Signed   By: Kennith Center M.D.   On: 10/08/2023 12:51   CT Head Wo Contrast  Result Date: 10/08/2023 CLINICAL DATA:  87 year old female with altered mental status, decreased p.o. intake. EXAM: CT HEAD WITHOUT CONTRAST TECHNIQUE: Contiguous axial images were obtained from the base of the skull through the vertex without intravenous contrast. RADIATION DOSE REDUCTION: This exam was performed according to the departmental dose-optimization program which includes automated exposure control, adjustment of the mA and/or kV according to patient size and/or use of iterative reconstruction technique. COMPARISON:  Head CT 09/09/2023 and earlier. FINDINGS: Brain: Stable cerebral volume. No midline shift, ventriculomegaly, mass effect, evidence of mass lesion, intracranial hemorrhage or evidence of cortically based acute infarction. Confluent bilateral cerebral white matter hypodensity, chronic cortical  encephalomalacia in the left parietal lobe. Chronic right caudate and smaller thalamic lacunar infarcts. Stable gray-white matter differentiation throughout the brain. Vascular: Calcified atherosclerosis at the skull base. No suspicious intracranial vascular hyperdensity. Calcified basal ganglia vascular calcifications. Skull: Intact, negative. Sinuses/Orbits: Visualized paranasal sinuses and mastoids are stable and well aerated. Other: No acute orbit or scalp soft tissue finding. IMPRESSION: 1. No acute intracranial abnormality. 2. Stable non contrast CT appearance of advanced chronic ischemic disease. Electronically Signed   By: Odessa Fleming M.D.   On: 10/08/2023 12:19    Procedures Procedures    Medications Ordered in ED Medications - No data to display  ED Course/ Medical Decision Making/ A&P                                 Medical Decision Making   Patient presents to the ED for concern of decreased appetite and AMS that started on Wednesday, this involves an extensive number of treatment options, and is a complaint that carries with it a high risk of complications and morbidity.  The differential diagnosis includes CVA, ACS, electrolyte abnormality, UTI   Co morbidities that complicate the patient evaluation  Previous hx of CVA (04/18/23 on plavix) Hemiplegia and hemiparesis following cerebral infarction affecting left non-dominant side    Additional history obtained:  Additional history obtained from Family, Nursing, and Outside Medical Records   External records from outside source obtained and reviewed   Lab Tests:  I Ordered, and personally interpreted labs.  The pertinent results include:  no leukocytosis, significant electrolyte abnormality, ammonia WNL, UA neg for infection   Imaging Studies ordered:  I ordered imaging studies including CT head  I independently visualized and interpreted imaging which showed no acute intracranial pathology I agree with the radiologist  interpretation   Cardiac Monitoring:  The patient was maintained on a cardiac monitor.  I personally viewed and interpreted the cardiac monitored which showed an underlying rhythm of: NSR with no STE nor ischemia    Consultations Obtained:  I requested consultation with hospitalist ,  and discussed lab and imaging findings as well as pertinent plan - they recommend: admission for altered mentation   Problem List / ED Course:  Decreased appetite AMS   Reevaluation:  After the interventions noted above, I reevaluated the patient and found that they have :stayed the same   Dispostion:  Patient is resting comfortably in bed. She occasionally responds to questions and commands. Family reports that she has had decreased appetite  and AMS since Wednesday. VS WNL with no fever noted.  Upon PE, patient has no tachypnea nor tachycardia. Patient takes shallow respirations but lung sounds appear to be clear. She does not appear to be volume overloaded on exam with no pedal edema bilaterally.  Labs neg for trop x 1. no elevated ammonia, elevated creatinine from baseline, nor significant electrolyte abnormality. TSH pending. UA neg for infection. CT head neg for ICH. CXR significant for cardiomegaly and pulmonary congestion  After consideration of the diagnostic results and the patients response to treatment, I feel that the patent would benefit from admission for workup of acute mentation change that started Wednesday. Dr. Jacqulyn Bath accepts patient for admission.         Final Clinical Impression(s) / ED Diagnoses Final diagnoses:  Decreased appetite  Altered mental status, unspecified altered mental status type    Rx / DC Orders ED Discharge Orders     None         Judithann Sheen, PA 10/08/23 1619    Rondel Baton, MD 10/12/23 1400

## 2023-10-09 DIAGNOSIS — R40244 Other coma, without documented Glasgow coma scale score, or with partial score reported, unspecified time: Secondary | ICD-10-CM

## 2023-10-09 LAB — GLUCOSE, CAPILLARY
Glucose-Capillary: 88 mg/dL (ref 70–99)
Glucose-Capillary: 107 mg/dL — ABNORMAL HIGH (ref 70–99)
Glucose-Capillary: 105 mg/dL — ABNORMAL HIGH (ref 70–99)
Glucose-Capillary: 133 mg/dL — ABNORMAL HIGH (ref 70–99)

## 2023-10-09 LAB — BASIC METABOLIC PANEL
Anion gap: 9 (ref 5–15)
BUN: 23 mg/dL (ref 8–23)
CO2: 23 mmol/L (ref 22–32)
Calcium: 8.8 mg/dL — ABNORMAL LOW (ref 8.9–10.3)
Chloride: 109 mmol/L (ref 98–111)
Creatinine, Ser: 1.59 mg/dL — ABNORMAL HIGH (ref 0.44–1.00)
GFR, Estimated: 32 mL/min — ABNORMAL LOW (ref >60)
Glucose, Bld: 107 mg/dL — ABNORMAL HIGH (ref 70–99)
Potassium: 3.7 mmol/L (ref 3.5–5.1)
Sodium: 141 mmol/L (ref 135–145)

## 2023-10-09 LAB — LIPID PANEL
Cholesterol: 236 mg/dL — ABNORMAL HIGH (ref 0–200)
HDL: 28 mg/dL — ABNORMAL LOW (ref >40)
LDL Cholesterol: 164 mg/dL — ABNORMAL HIGH (ref 0–99)
Total CHOL/HDL Ratio: 8.4 {ratio}
Triglycerides: 218 mg/dL — ABNORMAL HIGH (ref <150)
VLDL: 44 mg/dL — ABNORMAL HIGH (ref 0–40)

## 2023-10-09 MED ORDER — SODIUM CHLORIDE 0.9 % IV SOLN: INTRAVENOUS | Status: AC

## 2023-10-09 MED ORDER — HYDRALAZINE HCL 20 MG/ML IJ SOLN
10.0000 mg | Freq: Four times a day (QID) | INTRAMUSCULAR | Status: DC | PRN
  Administered 2023-10-09 (×2): 10 mg via INTRAVENOUS
  Filled 2023-10-09: qty 1

## 2023-10-09 MED ORDER — ARIPIPRAZOLE 2 MG PO TABS
2.0000 mg | ORAL_TABLET | Freq: Every day | ORAL | Status: DC
  Administered 2023-10-10 – 2023-10-14 (×5): 2 mg via ORAL
  Filled 2023-10-09 (×6): qty 1

## 2023-10-09 NOTE — Progress Notes (Signed)
PROGRESS NOTE    Diana Harper  WNU:[id number] DOB: 08-04-1936 DOA: 10/08/2023 PCP: Lewis Moccasin, MD   Brief Narrative:  HPI: [name] is a 87 y.o. female with medical history significant of multiple medical problems including CKD stage IIIa, asthma, hyperlipidemia, hypertension, CAD was brought into the emergency department with decreased appetite, weakness, altered mental status and slurred speech.  History was mostly obtained from the daughter who is at the bedside.  Patient has dementia and typically is only oriented to herself, her date of birth and family members around her.  Reportedly, since Wednesday, patient has been moving less often, has been speaking less often, has been eating less often as well.  She has been noted to have some more confusion than her baseline based on the dementia.  For all those reasons, she was brought into the emergency department.  No reports of chest pain, shortness of breath, cough, fever, chills, headache or any other complaint.   ED Course: Upon arrival to ED, she was significantly hypertensive.  Initially, she was not following commands.  However when I saw her, patient was able to follow commands and according to daughter, patient has improved significantly over the course of last 2 to 3 hours that she has been in the ED.  She is now recognizing her daughter at the bedside and cannot tell me her name.  Her speech is slightly more fluent compared to earlier although not back to baseline.  She had mild AKI on CKD stage IIIa with mild hypokalemia.  CT head and chest x-ray unremarkable.  Hospitalist were called for further admission.  Assessment & Plan:   Principal Problem:   AMS (altered mental status) Active Problems:   Acute renal failure superimposed on stage 3a chronic kidney disease (HCC)   Hypothyroidism   Uncontrolled type 2 diabetes mellitus with hyperglycemia, without long-term current use of insulin (HCC)   Hypokalemia    Hypertensive urgency   Dementia without behavioral disturbance (HCC)  Acute metabolic encephalopathy: Likely secondary to dehydration.  CT head negative.  Patient appears to be more alert.  She is oriented to her name only.  Not quite back to baseline yet.  Does not appear to have any focal deficit.  Following commands.   Dehydration/acute on chronic kidney disease stage III: Received IV fluids.  BMP pending.   Hypertensive urgency: Blood pressure improved but still elevated.  Per pharmacy tech, patient was not taking Coreg so that was discontinued.  Spoke to the daughter, per her, she takes some medications for blood pressure but she could not tell me the name.  She is not in the hospital yet.  She is coming and she will bring the list of the medications.  In the meantime we will continue as needed hydralazine.   Hypokalemia: Replenished yesterday and BMP pending.   Acquired hypothyroidism: Resume Synthroid.   Hyperlipidemia: Resume statin.   Asthma, mild intermittent: Completely asymptomatic.  Resume albuterol inhaler.   History of CAD: Continue Plavix.  She is asymptomatic.   History of depression: Resume Zoloft.   Type 2 diabetes mellitus: Recent hemoglobin A1c in chart was 7.7.  She does not appear to be on medications for this.  Will check hemoglobin A1c and start on SSI.  DVT prophylaxis: enoxaparin (LOVENOX) injection 30 mg Start: 10/08/23 1800   Code Status: Full Code  Family Communication:  None present at bedside.  Plan of care discussed with patient's daughter Diana Harper over the phone.  Status is: Observation The patient  will require care spanning > 2 midnights and should be moved to inpatient because: Failed swallow evaluation, BMP pending, blood pressure elevated, still encephalopathy.   Estimated body mass index is 22.06 kg/m as calculated from the following:   Height as of this encounter: 5\' 6"  (1.676 m).   Weight as of this encounter: 62 kg.    Nutritional  Assessment: Body mass index is 22.06 kg/m.[name] Seen by dietician.  I agree with the assessment and plan as outlined below: Nutrition Status:        . Skin Assessment: I have examined the patient's skin and I agree with the wound assessment as performed by the wound care RN as outlined below:    Consultants:  None  Procedures:  None  Antimicrobials:  Anti-infectives (From admission, onward)    None         Subjective: Patient seen and examined this morning.  She was definitely more alert compared to yesterday but oriented to self, not back to baseline yet.  Objective: Vitals:   10/08/23 2315 10/08/23 2335 10/09/23 0320 10/09/23 0751  BP: (!) 192/78 (!) 170/87 (!) 157/83 (!) 188/83  Pulse: 67 64 66 63  Resp: 18  16 18   Temp: 98.1 F (36.7 C)  (!) 97.5 F (36.4 C) 98.2 F (36.8 C)  TempSrc: Oral  Oral   SpO2: 98%  100% 100%  Weight:      Height:        Intake/Output Summary (Last 24 hours) at 10/09/2023 1239 Last data filed at 10/09/2023 0757 Gross per 24 hour  Intake 1257.3 ml  Output --  Net 1257.3 ml   Filed Weights   10/08/23 1330  Weight: 62 kg    Examination:  General exam: Appears calm and comfortable  Respiratory system: Clear to auscultation. Respiratory effort normal. Cardiovascular system: S1 & S2 heard, RRR. No JVD, murmurs, rubs, gallops or clicks. No pedal edema. Gastrointestinal system: Abdomen is nondistended, soft and nontender. No organomegaly or masses felt. Normal bowel sounds heard. Central nervous system: Alert and oriented x 1. No focal neurological deficits. Extremities: Symmetric 5 x 5 power. Skin: No rashes, lesions or ulcers   Data Reviewed: I have personally reviewed following labs and imaging studies  CBC: Recent Labs  Lab 10/08/23 1328 10/08/23 1900  WBC 6.7 6.3  NEUTROABS 4.0  --   HGB 11.7* 10.4*  HCT 34.0* 31.3*  MCV 85.9 87.2  PLT 219 215   Basic Metabolic Panel: Recent Labs  Lab 10/08/23 1328  10/08/23 1900  NA 141  --   K 3.3*  --   CL 105  --   CO2 27  --   GLUCOSE 121*  --   BUN 25*  --   CREATININE 1.89* 1.81*  CALCIUM 9.3  --    GFR: Estimated Creatinine Clearance: 21.7 mL/min (A) (by C-G formula based on SCr of 1.81 mg/dL (H)). Liver Function Tests: Recent Labs  Lab 10/08/23 1328  AST 17  ALT 16  ALKPHOS 59  BILITOT 1.0  PROT 7.0  ALBUMIN 3.6   No results for input(s): "LIPASE", "AMYLASE" in the last 168 hours. Recent Labs  Lab 10/08/23 1328  AMMONIA 16   Coagulation Profile: No results for input(s): "INR", "PROTIME" in the last 168 hours. Cardiac Enzymes: No results for input(s): "CKTOTAL", "CKMB", "CKMBINDEX", "TROPONINI" in the last 168 hours. BNP (last 3 results) No results for input(s): "PROBNP" in the last 8760 hours. HbA1C: Recent Labs    10/08/23 1900  HGBA1C 6.1*   CBG: Recent Labs  Lab 10/08/23 2023 10/09/23 0734 10/09/23 1155  GLUCAP 125* 88 107*   Lipid Profile: Recent Labs    10/09/23 0543  CHOL 236*  HDL 28*  LDLCALC 164*  TRIG 218*  CHOLHDL 8.4   Thyroid Function Tests: Recent Labs    10/08/23 1530  TSH 0.792   Anemia Panel: No results for input(s): "VITAMINB12", "FOLATE", "FERRITIN", "TIBC", "IRON", "RETICCTPCT" in the last 72 hours. Sepsis Labs: No results for input(s): "PROCALCITON", "LATICACIDVEN" in the last 168 hours.  No results found for this or any previous visit (from the past 240 hour(s)).   Radiology Studies: DG Chest 2 View  Result Date: 10/08/2023 CLINICAL DATA:  Altered mental status. EXAM: CHEST - 2 VIEW COMPARISON:  12/30/2022 FINDINGS: The cardio pericardial silhouette is enlarged. There is pulmonary vascular congestion. Mild interstitial pulmonary edema not excluded. No focal consolidation or substantial pleural effusion. No acute bony abnormality. IMPRESSION: Enlargement of the cardiopericardial silhouette with pulmonary vascular congestion. Mild interstitial pulmonary edema not excluded.  Electronically Signed   By: Kennith Center M.D.   On: 10/08/2023 12:51   CT Head Wo Contrast  Result Date: 10/08/2023 CLINICAL DATA:  87 year old female with altered mental status, decreased p.o. intake. EXAM: CT HEAD WITHOUT CONTRAST TECHNIQUE: Contiguous axial images were obtained from the base of the skull through the vertex without intravenous contrast. RADIATION DOSE REDUCTION: This exam was performed according to the departmental dose-optimization program which includes automated exposure control, adjustment of the mA and/or kV according to patient size and/or use of iterative reconstruction technique. COMPARISON:  Head CT 09/09/2023 and earlier. FINDINGS: Brain: Stable cerebral volume. No midline shift, ventriculomegaly, mass effect, evidence of mass lesion, intracranial hemorrhage or evidence of cortically based acute infarction. Confluent bilateral cerebral white matter hypodensity, chronic cortical encephalomalacia in the left parietal lobe. Chronic right caudate and smaller thalamic lacunar infarcts. Stable gray-white matter differentiation throughout the brain. Vascular: Calcified atherosclerosis at the skull base. No suspicious intracranial vascular hyperdensity. Calcified basal ganglia vascular calcifications. Skull: Intact, negative. Sinuses/Orbits: Visualized paranasal sinuses and mastoids are stable and well aerated. Other: No acute orbit or scalp soft tissue finding. IMPRESSION: 1. No acute intracranial abnormality. 2. Stable non contrast CT appearance of advanced chronic ischemic disease. Electronically Signed   By: Odessa Fleming M.D.   On: 10/08/2023 12:19    Scheduled Meds:  ARIPiprazole  2 mg Oral Daily   cholecalciferol  1,000 Units Oral Daily   clopidogrel  75 mg Oral Daily   enoxaparin (LOVENOX) injection  30 mg Subcutaneous Q24H   insulin aspart  0-15 Units Subcutaneous TID WC   insulin aspart  0-5 Units Subcutaneous QHS   levothyroxine  88 mcg Oral QAC breakfast   sertraline  150 mg  Oral QHS   Continuous Infusions:   LOS: 0 days   Hughie Closs, MD [facility]  10/09/2023, 12:39 PM   *Please note that this is a verbal dictation therefore any spelling or grammatical errors are due to the "[employer]" system interpretation.  Please page via Amion and do not message via secure chat for urgent patient care matters. Secure chat can be used for non urgent patient care matters.  How to contact the [facility] Attending or Consulting provider 7A - 7P or covering provider during after hours 7P -7A, for this patient?  Check the care team in [facility] and look for a) attending/consulting [facility] provider listed and b) the [facility] team listed. Page or secure  chat 7A-7P. Log into [email or web address] and use [employer] universal password to access. If you do not have the password, please contact the hospital operator. Locate the [facility] provider you are looking for under [facility] and page to a number that you can be directly reached. If you still have difficulty reaching the provider, please page the [facility] (Director on Call) for the Hospitalists listed on amion for assistance.

## 2023-10-09 NOTE — Evaluation (Signed)
Occupational Therapy Evaluation Patient Details Name: Diana Harper MRN: [id number] DOB: 1936/11/01 Today's Date: 10/09/2023   History of Present Illness Patient is a 87 year old female who was admitted with AMS, slurred speech, decreased appetite and weakness.patient was admitted with acute metabolic encephalopathy, dehydration, hypertensive urgency,  PMH: dementia, CKD III, asthma, hyperlipidemia, HTN, CAD, depression.   Clinical Impression   Patient is a 87 year old female who was admitted for above. Patient was living at home with support from daughter and other family members per chart review. Patient at this time remains confused and is having visual hallucinations in room nurse made aware. Patient is TD for rolling in bed with increased difficulty following commands for movements. Patient was noted to have decreased functional activity tolernace,decreased ability to follow directions, decreased ROM, decreased BUE strength, decreased endurance, decreased sitting balance, decreased standing balanced, decreased safety awareness, and decreased knowledge of AE/AD impacting participation in ADLs. Unclear how much support is available to patient at this time but patient currently would need +2 for movements. Patient will benefit from continued inpatient follow up therapy, <3 hours/day         If plan is discharge home, recommend the following: Two people to help with walking and/or transfers;Two people to help with bathing/dressing/bathroom;Assistance with cooking/housework;Direct supervision/assist for medications management;Assist for transportation;Help with stairs or ramp for entrance;Direct supervision/assist for financial management    Functional Status Assessment  Patient has had a recent decline in their functional status and demonstrates the ability to make significant improvements in function in a reasonable and predictable amount of time.  Equipment Recommendations  None  recommended by OT       Precautions / Restrictions Restrictions Weight Bearing Restrictions: No      Mobility Bed Mobility Overal bed mobility: Needs Assistance Bed Mobility: Rolling Rolling: Total assist         General bed mobility comments: to roll to each side to get towel out from under patient. patient unable to maintain positioning and unable to follow commands consistently TD for repositioning in bed.              ADL either performed or assessed with clinical judgement   ADL Overall ADL's : Needs assistance/impaired Eating/Feeding: Minimal assistance;Bed level   Grooming: Bed level;Moderate assistance Grooming Details (indicate cue type and reason): patient was unable to don headphones or bonnet to head without max a when patient indicated that she wanted them. Upper Body Bathing: Bed level;Maximal assistance   Lower Body Bathing: Bed level;Total assistance   Upper Body Dressing : Bed level;Maximal assistance   Lower Body Dressing: Bed level;Total assistance     Toilet Transfer Details (indicate cue type and reason): unable to progress to EOB with patient unable to follow commands and max A for repositioning in bed. Toileting- Clothing Manipulation and Hygiene: Bed level;Total assistance                Pertinent Vitals/Pain Pain Assessment Pain Assessment: No/denies pain     Extremity/Trunk Assessment Upper Extremity Assessment Upper Extremity Assessment: Difficult to assess due to impaired cognition (unable to get patient to stop to follow MMT testing or ROM during session. restless in bed.)   Lower Extremity Assessment Lower Extremity Assessment: Defer to PT evaluation       Communication Communication Communication: Difficulty communicating thoughts/reduced clarity of speech;Difficulty following commands/understanding Following commands: Follows one step commands inconsistently   Cognition Arousal: Alert Behavior During Therapy: Flat  affect Overall Cognitive Status: No family/caregiver  present to determine baseline cognitive functioning         General Comments: patient has a history of dementia. patient was noted to have confusion and difficulty to follow speech. patietn would start of clear on statements and then trail off into slurred speech. patient had difficulty folllowing directions. patient reported " he told me the hosptial" with increased time when asked where are we today?. patient asking to go home multiple time during session. at end of session patient reported seeing cracks in the ceiling and that there were things flying into them. nurse made aware.                Home Living Family/patient expects to be discharged to:: Private residence Living Arrangements: Children Available Help at Discharge: Family;Available 24 hours/day Type of Home: House Home Access: Stairs to enter [employer] of Steps: 4-5 Entrance Stairs-Rails: Right Home Layout: One level     Bathroom Shower/Tub: Chief Strategy Officer: Standard     Home Equipment: Agricultural consultant (2 wheels);BSC/3in1;Grab bars - toilet;Shower seat;Cane - single point;Tub bench;Wheelchair - manual;Other (comment)   Additional Comments: information was taken from previous hosptial admission. patient was unable to provide PLOF.      Prior Functioning/Environment Prior Level of Function : Needs assist             Mobility Comments: Walks with RW in home; Always has at least supervision with ambulation and generally requires some assist with transfers. ADLs Comments: Assist for shower transfers, bathing, dressing. Able to  transfer to [facility] at night without assist.        OT Problem List: Decreased activity tolerance;Impaired balance (sitting and/or standing);Decreased coordination;Decreased safety awareness;Decreased knowledge of precautions;Decreased knowledge of use of DME or AE      OT Treatment/Interventions:  Self-care/ADL training;Therapeutic exercise;DME and/or AE instruction;Therapeutic activities;Patient/family education;Balance training    OT Goals(Current goals can be found in the care plan section) Acute Rehab OT Goals Patient Stated Goal: to find the man OT Goal Formulation: Patient unable to participate in goal setting Time For Goal Achievement: 10/23/23 Potential to Achieve Goals: Fair  OT Frequency: Min 1X/week       AM-PAC OT "6 Clicks" Daily Activity     Outcome Measure Help from another person eating meals?: A Little Help from another person taking care of personal grooming?: A Lot Help from another person toileting, which includes using toliet, bedpan, or urinal?: Total Help from another person bathing (including washing, rinsing, drying)?: Total Help from another person to put on and taking off regular upper body clothing?: Total Help from another person to put on and taking off regular lower body clothing?: Total 6 Click Score: 9   End of Session Nurse Communication: Mobility status;Other (comment) (concerns for skin breakdown with frequent urination)  Activity Tolerance: Patient limited by fatigue;Other (comment) (inability to follow commands) Patient left: in bed;with call bell/phone within reach;with bed alarm set  OT Visit Diagnosis: Unsteadiness on feet (R26.81);Other abnormalities of gait and mobility (R26.89);Muscle weakness (generalized) (M62.81)                Time: 4098-1191 OT Time Calculation (min): 13 min Charges:  OT General Charges $OT Visit: 1 Visit OT Evaluation $OT Eval Low Complexity: 1 Low  Dorothe Elmore OTR/L, MS Acute Rehabilitation Department Office# [phone]   Selinda Flavin 10/09/2023, 3:50 PM

## 2023-10-09 NOTE — Evaluation (Signed)
Clinical/Bedside Swallow Evaluation Patient Details  Name: Diana Harper MRN: [id number] Date of Birth: 1936-03-08  Today's Date: 10/09/2023 Time: SLP Start Time (ACUTE ONLY): 1305 SLP Stop Time (ACUTE ONLY): 1330 SLP Time Calculation (min) (ACUTE ONLY): 25 min  Past Medical History:  Past Medical History:  Diagnosis Date   Anemia    Anxiety    Arthritis    Asthma    Biliary colic    C. difficile colitis 08/2022   Cholelithiasis    CKD (chronic kidney disease), stage III [facility])    nephrologist--  dr Reynolds Bowl   Colon polyps    Diverticulosis of colon    Gastric ulcer    Glaucoma    History of asthma    1980's  no longer problem since 1980's   History of diverticulitis of colon    recurrent--  2014;  2013;  2012   Hyperlipidemia    Hypertension    Hypothyroidism    Insulin dependent type 2 diabetes mellitus (HCC)    followed by dr Jonny Ruiz lambeth ([facility])    Murmur    Peripheral neuropathy    Stroke [facility])    Vitamin D deficiency    Wears hearing aid    bilateral   Wears partial dentures    lower partial and upper full   Past Surgical History:  Past Surgical History:  Procedure Laterality Date   ABDOMINAL HYSTERECTOMY  1970's   CARDIOVASCULAR STRESS TEST  07/30/2009   normal exercise lexiscan nuclear study w/ no ischemia/  normal LV funciton and wall motion, ef 77%   CATARACT EXTRACTION W/ INTRAOCULAR LENS  IMPLANT, BILATERAL  2014   LAPAROSCOPIC CHOLECYSTECTOMY SINGLE SITE WITH INTRAOPERATIVE CHOLANGIOGRAM N/A 08/25/2016   Procedure: LAPAROSCOPIC CHOLECYSTECTOMY WITH INTRAOPERATIVE CHOLANGIOGRAM;  Surgeon: Rodman Pickle, MD;  Location: [facility] [place];  Service: General;  Laterality: N/A;   LUMBAR SPINE SURGERY  2014   REMOVAL AXILLA CYST Right 1990's   HPI:  Diana Harper is a 87 y.o. female with medical history significant of multiple medical problems including CKD stage IIIa, asthma, hyperlipidemia, hypertension, CAD was brought into the  emergency department with decreased appetite, weakness, altered mental status and slurred speech.  History was mostly obtained from the daughter who is at the bedside.  Patient has dementia and typically is only oriented to herself, her date of birth and family members around her.  Reportedly, since Wednesday, patient has been moving less often, has been speaking less often, has been eating less often as well.    Assessment / Plan / Recommendation  Clinical Impression  Pt easily accepts sips of water and says "ooo that water tastes good!" No encouragement or oral holding observed. Otherwise pts language was unintelligible with paraphasias/word salad. When offered applesauce pt was more reluctant and grimaced and indicated she didnt like it. Held bolus on her tongue briefly before swallowing after encouragement. Did not offer solids; no soft options were available and pt does not have dentures. Called her daughter and she is going to bring in dentures. At baseline, she wear her dentures and eats regular textured foods, so she will need to work back to that. For now, pt will start purees and thin liquids.      Aspiration Risk       Diet Recommendation Dysphagia 1 (Puree);Thin liquid    Liquid Administration via: Cup;Straw Medication Administration: Whole meds with liquid Supervision: Staff to assist with self feeding;Full supervision/cueing for compensatory strategies Compensations: Slow rate;Small sips/bites Postural Changes: Seated upright  at 90 degrees    Other  Recommendations      Recommendations for follow up therapy are one component of a multi-disciplinary discharge planning process, led by the attending physician.  Recommendations may be updated based on patient status, additional functional criteria and insurance authorization.  Follow up Recommendations Home health SLP      Assistance Recommended at Discharge    Functional Status Assessment    Frequency and Duration min 2x/week   2 weeks       Prognosis Prognosis for improved oropharyngeal function: Good      Swallow Study   General HPI: [name] is a 87 y.o. female with medical history significant of multiple medical problems including CKD stage IIIa, asthma, hyperlipidemia, hypertension, CAD was brought into the emergency department with decreased appetite, weakness, altered mental status and slurred speech.  History was mostly obtained from the daughter who is at the bedside.  Patient has dementia and typically is only oriented to herself, her date of birth and family members around her.  Reportedly, since Wednesday, patient has been moving less often, has been speaking less often, has been eating less often as well. Type of Study: Bedside Swallow Evaluation Previous Swallow Assessment: none Diet Prior to this Study: NPO Temperature Spikes Noted: No Respiratory Status: Room air History of Recent Intubation: No Behavior/Cognition: Alert;Requires cueing Oral Cavity Assessment: Dry Oral Care Completed by SLP: No Oral Cavity - Dentition: Edentulous Vision: Impaired for self-feeding Self-Feeding Abilities: Needs assist Patient Positioning: Upright in bed Baseline Vocal Quality: Normal Volitional Cough: Cognitively unable to elicit Volitional Swallow: Unable to elicit    Oral/Motor/Sensory Function Overall Oral Motor/Sensory Function: Within functional limits   Ice Chips Ice chips: Not tested   Thin Liquid Thin Liquid: Within functional limits Presentation: Straw    Nectar Thick Nectar Thick Liquid: Not tested   Honey Thick Honey Thick Liquid: Not tested   Puree Puree: Within functional limits   Solid     Solid: Not tested      [name], [name] 10/09/2023,1:30 PM

## 2023-10-10 ENCOUNTER — Inpatient Hospital Stay (HOSPITAL_COMMUNITY): Payer: Medicare Other

## 2023-10-10 DIAGNOSIS — N179 Acute kidney failure, unspecified: Secondary | ICD-10-CM | POA: Diagnosis present

## 2023-10-10 DIAGNOSIS — I6340 Cerebral infarction due to embolism of unspecified cerebral artery: Secondary | ICD-10-CM | POA: Diagnosis not present

## 2023-10-10 DIAGNOSIS — N1832 Chronic kidney disease, stage 3b: Secondary | ICD-10-CM | POA: Diagnosis present

## 2023-10-10 DIAGNOSIS — I639 Cerebral infarction, unspecified: Secondary | ICD-10-CM | POA: Diagnosis not present

## 2023-10-10 DIAGNOSIS — I6523 Occlusion and stenosis of bilateral carotid arteries: Secondary | ICD-10-CM | POA: Diagnosis not present

## 2023-10-10 DIAGNOSIS — F32A Depression, unspecified: Secondary | ICD-10-CM | POA: Diagnosis present

## 2023-10-10 DIAGNOSIS — I160 Hypertensive urgency: Secondary | ICD-10-CM | POA: Diagnosis present

## 2023-10-10 DIAGNOSIS — G9341 Metabolic encephalopathy: Secondary | ICD-10-CM | POA: Diagnosis present

## 2023-10-10 DIAGNOSIS — E1165 Type 2 diabetes mellitus with hyperglycemia: Secondary | ICD-10-CM | POA: Diagnosis present

## 2023-10-10 DIAGNOSIS — G8194 Hemiplegia, unspecified affecting left nondominant side: Secondary | ICD-10-CM | POA: Diagnosis present

## 2023-10-10 DIAGNOSIS — E039 Hypothyroidism, unspecified: Secondary | ICD-10-CM | POA: Diagnosis present

## 2023-10-10 DIAGNOSIS — E876 Hypokalemia: Secondary | ICD-10-CM | POA: Diagnosis present

## 2023-10-10 DIAGNOSIS — I63411 Cerebral infarction due to embolism of right middle cerebral artery: Secondary | ICD-10-CM | POA: Diagnosis present

## 2023-10-10 DIAGNOSIS — E1122 Type 2 diabetes mellitus with diabetic chronic kidney disease: Secondary | ICD-10-CM | POA: Diagnosis present

## 2023-10-10 DIAGNOSIS — I2510 Atherosclerotic heart disease of native coronary artery without angina pectoris: Secondary | ICD-10-CM | POA: Diagnosis present

## 2023-10-10 DIAGNOSIS — Z794 Long term (current) use of insulin: Secondary | ICD-10-CM | POA: Diagnosis not present

## 2023-10-10 DIAGNOSIS — I6601 Occlusion and stenosis of right middle cerebral artery: Secondary | ICD-10-CM | POA: Diagnosis not present

## 2023-10-10 DIAGNOSIS — R1310 Dysphagia, unspecified: Secondary | ICD-10-CM | POA: Diagnosis present

## 2023-10-10 DIAGNOSIS — J4520 Mild intermittent asthma, uncomplicated: Secondary | ICD-10-CM | POA: Diagnosis present

## 2023-10-10 DIAGNOSIS — E7800 Pure hypercholesterolemia, unspecified: Secondary | ICD-10-CM | POA: Diagnosis present

## 2023-10-10 DIAGNOSIS — R627 Adult failure to thrive: Secondary | ICD-10-CM | POA: Diagnosis present

## 2023-10-10 DIAGNOSIS — I6381 Other cerebral infarction due to occlusion or stenosis of small artery: Secondary | ICD-10-CM | POA: Diagnosis not present

## 2023-10-10 DIAGNOSIS — R4701 Aphasia: Secondary | ICD-10-CM | POA: Diagnosis present

## 2023-10-10 DIAGNOSIS — R630 Anorexia: Secondary | ICD-10-CM | POA: Diagnosis not present

## 2023-10-10 DIAGNOSIS — B370 Candidal stomatitis: Secondary | ICD-10-CM | POA: Diagnosis present

## 2023-10-10 DIAGNOSIS — I6389 Other cerebral infarction: Secondary | ICD-10-CM | POA: Diagnosis not present

## 2023-10-10 DIAGNOSIS — F0390 Unspecified dementia without behavioral disturbance: Secondary | ICD-10-CM | POA: Diagnosis present

## 2023-10-10 DIAGNOSIS — R4182 Altered mental status, unspecified: Secondary | ICD-10-CM | POA: Diagnosis present

## 2023-10-10 DIAGNOSIS — R40244 Other coma, without documented Glasgow coma scale score, or with partial score reported, unspecified time: Secondary | ICD-10-CM | POA: Diagnosis not present

## 2023-10-10 DIAGNOSIS — I129 Hypertensive chronic kidney disease with stage 1 through stage 4 chronic kidney disease, or unspecified chronic kidney disease: Secondary | ICD-10-CM | POA: Diagnosis present

## 2023-10-10 DIAGNOSIS — E860 Dehydration: Secondary | ICD-10-CM | POA: Diagnosis present

## 2023-10-10 DIAGNOSIS — G319 Degenerative disease of nervous system, unspecified: Secondary | ICD-10-CM | POA: Diagnosis not present

## 2023-10-10 DIAGNOSIS — I6310 Cerebral infarction due to embolism of unspecified precerebral artery: Secondary | ICD-10-CM | POA: Diagnosis not present

## 2023-10-10 DIAGNOSIS — E1142 Type 2 diabetes mellitus with diabetic polyneuropathy: Secondary | ICD-10-CM | POA: Diagnosis present

## 2023-10-10 LAB — BASIC METABOLIC PANEL
Anion gap: 10 (ref 5–15)
BUN: 19 mg/dL (ref 8–23)
CO2: 22 mmol/L (ref 22–32)
Calcium: 8.6 mg/dL — ABNORMAL LOW (ref 8.9–10.3)
Chloride: 109 mmol/L (ref 98–111)
Creatinine, Ser: 1.37 mg/dL — ABNORMAL HIGH (ref 0.44–1.00)
GFR, Estimated: 38 mL/min — ABNORMAL LOW (ref >60)
Glucose, Bld: 107 mg/dL — ABNORMAL HIGH (ref 70–99)
Potassium: 2.9 mmol/L — ABNORMAL LOW (ref 3.5–5.1)
Sodium: 141 mmol/L (ref 135–145)

## 2023-10-10 LAB — GLUCOSE, CAPILLARY
Glucose-Capillary: 117 mg/dL — ABNORMAL HIGH (ref 70–99)
Glucose-Capillary: 112 mg/dL — ABNORMAL HIGH (ref 70–99)
Glucose-Capillary: 115 mg/dL — ABNORMAL HIGH (ref 70–99)
Glucose-Capillary: 129 mg/dL — ABNORMAL HIGH (ref 70–99)

## 2023-10-10 LAB — CBC WITH DIFFERENTIAL/PLATELET
Abs Immature Granulocytes: 0.04 10*3/uL (ref 0.00–0.07)
Basophils Absolute: 0 10*3/uL (ref 0.0–0.1)
Basophils Relative: 1 %
Eosinophils Absolute: 0.1 10*3/uL (ref 0.0–0.5)
Eosinophils Relative: 1 %
HCT: 33 % — ABNORMAL LOW (ref 36.0–46.0)
Hemoglobin: 11.2 g/dL — ABNORMAL LOW (ref 12.0–15.0)
Immature Granulocytes: 1 %
Lymphocytes Relative: 28 %
Lymphs Abs: 1.7 10*3/uL (ref 0.7–4.0)
MCH: 29.1 pg (ref 26.0–34.0)
MCHC: 33.9 g/dL (ref 30.0–36.0)
MCV: 85.7 fL (ref 80.0–100.0)
Monocytes Absolute: 0.4 10*3/uL (ref 0.1–1.0)
Monocytes Relative: 7 %
Neutro Abs: 3.8 10*3/uL (ref 1.7–7.7)
Neutrophils Relative %: 62 %
Platelets: 226 10*3/uL (ref 150–400)
RBC: 3.85 MIL/uL — ABNORMAL LOW (ref 3.87–5.11)
RDW: 13.2 % (ref 11.5–15.5)
WBC: 6 10*3/uL (ref 4.0–10.5)
nRBC: 0 % (ref 0.0–0.2)

## 2023-10-10 LAB — MAGNESIUM: Magnesium: 1.6 mg/dL — ABNORMAL LOW (ref 1.7–2.4)

## 2023-10-10 MED ORDER — POTASSIUM CHLORIDE CRYS ER 20 MEQ PO TBCR
40.0000 meq | EXTENDED_RELEASE_TABLET | ORAL | Status: AC
  Administered 2023-10-10 (×2): 40 meq via ORAL
  Filled 2023-10-10 (×2): qty 2

## 2023-10-10 MED ORDER — CARVEDILOL 6.25 MG PO TABS
6.2500 mg | ORAL_TABLET | Freq: Two times a day (BID) | ORAL | Status: DC
  Administered 2023-10-10 – 2023-10-14 (×9): 6.25 mg via ORAL
  Filled 2023-10-10 (×9): qty 1

## 2023-10-10 MED ORDER — VITAMIN D 25 MCG (1000 UNIT) PO TABS
5000.0000 [IU] | ORAL_TABLET | Freq: Every day | ORAL | Status: DC
  Administered 2023-10-10 – 2023-10-14 (×5): 5000 [IU] via ORAL
  Filled 2023-10-10 (×5): qty 5

## 2023-10-10 MED ORDER — MAGNESIUM SULFATE 4 GM/100ML IV SOLN
4.0000 g | Freq: Once | INTRAVENOUS | Status: AC
  Administered 2023-10-10: 4 g via INTRAVENOUS
  Filled 2023-10-10: qty 100

## 2023-10-10 MED ORDER — HYDRALAZINE HCL 20 MG/ML IJ SOLN
10.0000 mg | Freq: Four times a day (QID) | INTRAMUSCULAR | Status: DC | PRN
  Administered 2023-10-10 – 2023-10-14 (×7): 10 mg via INTRAVENOUS
  Filled 2023-10-10 (×8): qty 1

## 2023-10-10 NOTE — Progress Notes (Signed)
PROGRESS NOTE    Diana Harper  YQM:[id number] DOB: 1936/04/24 DOA: 10/08/2023 PCP: Lewis Moccasin, MD   Brief Narrative:  HPI: Diana Harper is a 87 y.o. female with medical history significant of multiple medical problems including CKD stage IIIa, asthma, hyperlipidemia, hypertension, CAD was brought into the emergency department with decreased appetite, weakness, altered mental status and slurred speech.  History was mostly obtained from the daughter who is at the bedside.  Patient has dementia and typically is only oriented to herself, her date of birth and family members around her.  Reportedly, since Wednesday, patient has been moving less often, has been speaking less often, has been eating less often as well.  She has been noted to have some more confusion than her baseline based on the dementia.  For all those reasons, she was brought into the emergency department.  No reports of chest pain, shortness of breath, cough, fever, chills, headache or any other complaint.   ED Course: Upon arrival to ED, she was significantly hypertensive.  Initially, she was not following commands.  However when I saw her, patient was able to follow commands and according to daughter, patient has improved significantly over the course of last 2 to 3 hours that she has been in the ED.  She is now recognizing her daughter at the bedside and cannot tell me her name.  Her speech is slightly more fluent compared to earlier although not back to baseline.  She had mild AKI on CKD stage IIIa with mild hypokalemia.  CT head and chest x-ray unremarkable.  Hospitalist were called for further admission.  Assessment & Plan:   Principal Problem:   AMS (altered mental status) Active Problems:   Acute renal failure superimposed on stage 3a chronic kidney disease (HCC)   Hypothyroidism   Uncontrolled type 2 diabetes mellitus with hyperglycemia, without long-term current use of insulin (HCC)   Hypokalemia    Hypertensive urgency   Dementia without behavioral disturbance (HCC)  Acute metabolic encephalopathy/generalized weakness: CT head negative.  Patient has been more alert since admission but still appears to have word finding difficulty, not back to baseline yet.  Does appear to have some left-sided neglect as well.  Raising concern for stroke.  I have ordered MRI now.  She has been seen by PT OT and they have recommended SNF.    Dehydration/acute on chronic kidney disease stage IIIb: Received IV fluids.  Creatinine back to baseline.   Hypertensive urgency: Blood pressure improved from admission but still elevated.  Per pharmacy tech, patient was not taking Coreg so that was discontinued.  Spoke to the daughter, per her, she takes some medications for blood pressure but she could not tell me the name.  She was supposed to bring list of medications to the hospital yesterday but I have not received that list yet.  Unsure if she has a stroke however she is now in the hospital for almost 48 hours so we will try to control her blood pressure so I will start her back on Coreg 6.25 mg twice daily.  Dysphagia: SLP on board.  Patient started on dysphagia 1 diet.   Hypokalemia/hypomagnesemia: Extremely low again, will replenish.   Acquired hypothyroidism: Continue Synthroid.   Hyperlipidemia: Resume statin.   Asthma, mild intermittent: Completely asymptomatic.  Resume albuterol inhaler.   History of CAD: Continue Plavix.  She is asymptomatic.   History of depression: Resume Zoloft.   Type 2 diabetes mellitus: Recent hemoglobin A1c in chart was 7.7  and 6.1 this hospitalization.  She does not appear to be on medications for this.  Continue SSI.  DVT prophylaxis: enoxaparin (LOVENOX) injection 30 mg Start: 10/08/23 1800   Code Status: Full Code  Family Communication:  None present at bedside.  Plan of care discussed with patient's daughter [name] over the phone.  Status is: Inpatient Remains  inpatient appropriate because: Still waiting for MRI and now needs SNF.  TOC aware.     Estimated body mass index is 22.06 kg/m as calculated from the following:   Height as of this encounter: 5\' 6"  (1.676 m).   Weight as of this encounter: 62 kg.    Nutritional Assessment: Body mass index is 22.06 kg/m.[name] Seen by dietician.  I agree with the assessment and plan as outlined below: Nutrition Status:        . Skin Assessment: I have examined the patient's skin and I agree with the wound assessment as performed by the wound care RN as outlined below:    Consultants:  None  Procedures:  None  Antimicrobials:  Anti-infectives (From admission, onward)    None         Subjective: Seen and examined earlier today.  She was alert but she was having word finding difficulty.  Objective: Vitals:   10/10/23 0103 10/10/23 0106 10/10/23 0516 10/10/23 1144  BP: (!) 181/80 (!) 145/104 (!) 182/89 (!) 184/94  Pulse: 80 79 78 70  Resp:   16 16  Temp:   98.5 F (36.9 C) 98.8 F (37.1 C)  TempSrc:   Oral   SpO2:   93% 100%  Weight:      Height:        Intake/Output Summary (Last 24 hours) at 10/10/2023 1511 Last data filed at 10/09/2023 1700 Gross per 24 hour  Intake 220 ml  Output --  Net 220 ml   Filed Weights   10/08/23 1330  Weight: 62 kg    Examination:  General exam: Appears calm and comfortable  Respiratory system: Clear to auscultation. Respiratory effort normal. Cardiovascular system: S1 & S2 heard, RRR. No JVD, murmurs, rubs, gallops or clicks. No pedal edema. Gastrointestinal system: Abdomen is nondistended, soft and nontender. No organomegaly or masses felt. Normal bowel sounds heard. Central nervous system: Alert and oriented x 1. No focal neurological deficits.  Slight neglect on the left side but she is following commands, however it is taking some time for her. Extremities: Symmetric 5 x 5 power. Skin: No rashes, lesions or ulcers.    Data  Reviewed: I have personally reviewed following labs and imaging studies  CBC: Recent Labs  Lab 10/08/23 1328 10/08/23 1900 10/10/23 0903  WBC 6.7 6.3 6.0  NEUTROABS 4.0  --  3.8  HGB 11.7* 10.4* 11.2*  HCT 34.0* 31.3* 33.0*  MCV 85.9 87.2 85.7  PLT 219 215 226   Basic Metabolic Panel: Recent Labs  Lab 10/08/23 1328 10/08/23 1900 10/09/23 1310 10/10/23 0903  NA 141  --  141 141  K 3.3*  --  3.7 2.9*  CL 105  --  109 109  CO2 27  --  23 22  GLUCOSE 121*  --  107* 107*  BUN 25*  --  23 19  CREATININE 1.89* 1.81* 1.59* 1.37*  CALCIUM 9.3  --  8.8* 8.6*  MG  --   --   --  1.6*   GFR: Estimated Creatinine Clearance: 28.6 mL/min (A) (by C-G formula based on SCr of 1.37 mg/dL (H)). Liver  Function Tests: Recent Labs  Lab 10/08/23 1328  AST 17  ALT 16  ALKPHOS 59  BILITOT 1.0  PROT 7.0  ALBUMIN 3.6   No results for input(s): "LIPASE", "AMYLASE" in the last 168 hours. Recent Labs  Lab 10/08/23 1328  AMMONIA 16   Coagulation Profile: No results for input(s): "INR", "PROTIME" in the last 168 hours. Cardiac Enzymes: No results for input(s): "CKTOTAL", "CKMB", "CKMBINDEX", "TROPONINI" in the last 168 hours. BNP (last 3 results) No results for input(s): "PROBNP" in the last 8760 hours. HbA1C: Recent Labs    10/08/23 1900  HGBA1C 6.1*   CBG: Recent Labs  Lab 10/09/23 1155 10/09/23 1639 10/09/23 2026 10/10/23 0744 10/10/23 1142  GLUCAP 107* 105* 133* 117* 112*   Lipid Profile: Recent Labs    10/09/23 0543  CHOL 236*  HDL 28*  LDLCALC 164*  TRIG 218*  CHOLHDL 8.4   Thyroid Function Tests: Recent Labs    10/08/23 1530  TSH 0.792   Anemia Panel: No results for input(s): "VITAMINB12", "FOLATE", "FERRITIN", "TIBC", "IRON", "RETICCTPCT" in the last 72 hours. Sepsis Labs: No results for input(s): "PROCALCITON", "LATICACIDVEN" in the last 168 hours.  No results found for this or any previous visit (from the past 240 hour(s)).   Radiology  Studies: No results found.  Scheduled Meds:  ARIPiprazole  2 mg Oral Daily   carvedilol  6.25 mg Oral BID   cholecalciferol  5,000 Units Oral Daily   clopidogrel  75 mg Oral Daily   enoxaparin (LOVENOX) injection  30 mg Subcutaneous Q24H   insulin aspart  0-15 Units Subcutaneous TID WC   insulin aspart  0-5 Units Subcutaneous QHS   levothyroxine  88 mcg Oral QAC breakfast   potassium chloride  40 mEq Oral Q4H   sertraline  150 mg Oral QHS   Continuous Infusions:  magnesium sulfate bolus IVPB       LOS: 0 days   Hughie Closs, MD [facility]  10/10/2023, 3:11 PM   *Please note that this is a verbal dictation therefore any spelling or grammatical errors are due to the "Dragon Medical One" system interpretation.  Please page via Amion and do not message via secure chat for urgent patient care matters. Secure chat can be used for non urgent patient care matters.  How to contact the [facility] Attending or Consulting provider 7A - 7P or covering provider during after hours 7P -7A, for this patient?  Check the care team in [facility] and look for a) attending/consulting [facility] provider listed and b) the [facility] team listed. Page or secure chat 7A-7P. Log into [email or web address] and use [facility] universal password to access. If you do not have the password, please contact the hospital operator. Locate the [facility] provider you are looking for under [facility] and page to a number that you can be directly reached. If you still have difficulty reaching the provider, please page the [facility] (Director on Call) for the Hospitalists listed on amion for assistance.

## 2023-10-10 NOTE — Plan of Care (Signed)

## 2023-10-10 NOTE — Evaluation (Signed)
Physical Therapy Evaluation Patient Details Name: Jahliyah Kubick MRN: [id number] DOB: [birth year] Today's Date: 10/10/2023  History of Present Illness  Patient is a 87 year old female who was admitted with AMS, slurred speech, decreased appetite and weakness.patient was admitted with acute metabolic encephalopathy, dehydration, hypertensive urgency,  CT head: negative for acute changes; MRI ordered 11/11 still pending.  PMH: dementia, CKD III, asthma, hyperlipidemia, HTN, CAD, depression.  Clinical Impression  Pt admitted with above diagnosis. Pt was from home.  Unsure of exact baseline but per EMS notes normally ambulatory and per PT notes 5/24 was ambulatory.  Today, pt with confusion, difficulty following commands, and minimal communication with unintelligible speech.  She required max x 2 for rolling and transition to EOB.  Pt not able to follow commands to stand or transfer OOB.  She does tend to keep head turned to the right and more difficulty tracking L compared to R (noted hx of R MCA), does have MRI ordered.  Pt below baseline and will benefit from PT. Pt currently with functional limitations due to the deficits listed below (see PT Problem List). Pt will benefit from acute skilled PT to increase their independence and safety with mobility to allow discharge.  Patient will benefit from continued inpatient follow up therapy, <3 hours/day at d/c.          If plan is discharge home, recommend the following: Two people to help with walking and/or transfers;Two people to help with bathing/dressing/bathroom   Can travel by private vehicle   No    Equipment Recommendations Other (comment);Hoyer lift;Hospital bed;Wheelchair (measurements PT);Wheelchair cushion (measurements PT) (defer to SNF)  Recommendations for Other Services       Functional Status Assessment Patient has had a recent decline in their functional status and demonstrates the ability to make significant improvements in  function in a reasonable and predictable amount of time.     Precautions / Restrictions Precautions Precautions: Fall Restrictions Weight Bearing Restrictions: No      Mobility  Bed Mobility Overal bed mobility: Needs Assistance Bed Mobility: Rolling, Supine to Sit, Sit to Supine Rolling: Max assist, +2 for physical assistance   Supine to sit: Max assist, +2 for physical assistance Sit to supine: Max assist, +2 for physical assistance   General bed mobility comments: Pt and bed wet with urine at arrival. Rolled for cleaning prior to OOB activity. Cues and assist positioning for all transfers.  Requiring max A x 2 for all, pt occasionally reaching with UE but otherwise max A.    Transfers                   General transfer comment: not able to follow commands to stand    Ambulation/Gait                  Stairs            Wheelchair Mobility     Tilt Bed    Modified Rankin (Stroke Patients Only)       Balance Overall balance assessment: Needs assistance Sitting-balance support: No upper extremity supported Sitting balance-Leahy Scale: Fair Sitting balance - Comments: Could sit at EOB without UE support but with any challenges leans posteriorly       Standing balance comment: unable                             Pertinent Vitals/Pain Pain Assessment Pain Assessment: No/denies pain  Home Living Family/patient expects to be discharged to:: Private residence Living Arrangements: Children Available Help at Discharge: Family;Available 24 hours/day Type of Home: House Home Access: Stairs to enter Entrance Stairs-Rails: Right Entrance Stairs-Number of Steps: 4-5   Home Layout: One level Home Equipment: Rolling Walker (2 wheels);BSC/3in1;Grab bars - toilet;Shower seat;Cane - single point;Tub bench;Wheelchair - manual;Other (comment) Additional Comments: Information from hospitalization in 5/24, pt not able to provide.    Prior  Function Prior Level of Function : Needs assist             Mobility Comments: From 5/24 history: Walks with RW in home; Always has at least supervision with ambulation and generally requires some assist with transfers; at admission 5/24 pt was ambulating 60' with RW and min A ADLs Comments: Assist for shower transfers, bathing, dressing. Able to  transfer to [facility] at night without assist.     Extremity/Trunk Assessment   Upper Extremity Assessment Upper Extremity Assessment: Difficult to assess due to impaired cognition (Spontaneous movement bil UE, not able to follow MMT commands, no obvious ROM deficits)    Lower Extremity Assessment Lower Extremity Assessment: LLE deficits/detail;RLE deficits/detail;Difficult to assess due to impaired cognition RLE Deficits / Details: Pt keeping legs stiff in bed but once at EOB flexing knees to 90 degrees, ROM grossly WFL; Not able to follow MMT commands, does appear to be at least 2/5 throughout (resisting movement at times). LLE Deficits / Details: Pt keeping legs stiff in bed but once at EOB flexing knees to 90 degrees, ROM grossly WFL; Not able to follow MMT commands, does appear to be at least 2/5 throughout (resisting movement at times).    Cervical / Trunk Assessment Cervical / Trunk Assessment: Kyphotic  Communication   Communication Communication: Difficulty following commands/understanding  Cognition Arousal: Alert Behavior During Therapy: Flat affect Overall Cognitive Status: No family/caregiver present to determine baseline cognitive functioning                                 General Comments: Pt with hx of dementia.  Speech unitelligiable/mumbling, pt reaching for things in the air (?hallucinating), difficulty following commands (~5%), potential L neglect (prefers head turned R and difficulty tracking eye contact with therapist past midline).  Pt eventually looking slightly past midline but still decreased L compared to  R. Did note hx of R MCA infarct.        General Comments      Exercises     Assessment/Plan    PT Assessment Patient needs continued PT services  PT Problem List Decreased strength;Decreased range of motion;Decreased activity tolerance;Decreased balance;Decreased mobility;Decreased coordination;Decreased cognition;Decreased knowledge of use of DME;Decreased knowledge of precautions;Decreased safety awareness       PT Treatment Interventions DME instruction;Therapeutic exercise;Gait training;Balance training;Neuromuscular re-education;Functional mobility training;Therapeutic activities;Patient/family education;Cognitive remediation;Manual techniques;Modalities    PT Goals (Current goals can be found in the Care Plan section)  Acute Rehab PT Goals Patient Stated Goal: pt unable PT Goal Formulation: Patient unable to participate in goal setting Time For Goal Achievement: 10/24/23 Potential to Achieve Goals: Fair    Frequency Min 1X/week     Co-evaluation               AM-PAC PT "6 Clicks" Mobility  Outcome Measure Help needed turning from your back to your side while in a flat bed without using bedrails?: Total Help needed moving from lying on your back to sitting on  the side of a flat bed without using bedrails?: Total Help needed moving to and from a bed to a chair (including a wheelchair)?: Total Help needed standing up from a chair using your arms (e.g., wheelchair or bedside chair)?: Total Help needed to walk in hospital room?: Total Help needed climbing 3-5 steps with a railing? : Total 6 Click Score: 6    End of Session Equipment Utilized During Treatment: Gait belt Activity Tolerance: Other (comment) (limited by cognition) Patient left: in bed;with call bell/phone within reach;with bed alarm set Nurse Communication: Mobility status PT Visit Diagnosis: Other abnormalities of gait and mobility (R26.89);Muscle weakness (generalized) (M62.81)    Time:  1425-1450 PT Time Calculation (min) (ACUTE ONLY): 25 min   Charges:   PT Evaluation $PT Eval Low Complexity: 1 Low PT Treatments $Therapeutic Activity: 8-22 mins PT General Charges $$ ACUTE PT VISIT: 1 Visit         Anise Salvo, PT Acute Rehab [facility] [phone]   Rayetta Humphrey 10/10/2023, 3:39 PM

## 2023-10-11 ENCOUNTER — Inpatient Hospital Stay (HOSPITAL_COMMUNITY): Payer: Medicare Other

## 2023-10-11 DIAGNOSIS — R4182 Altered mental status, unspecified: Secondary | ICD-10-CM | POA: Diagnosis not present

## 2023-10-11 DIAGNOSIS — R40244 Other coma, without documented Glasgow coma scale score, or with partial score reported, unspecified time: Secondary | ICD-10-CM | POA: Diagnosis not present

## 2023-10-11 DIAGNOSIS — I639 Cerebral infarction, unspecified: Secondary | ICD-10-CM | POA: Diagnosis not present

## 2023-10-11 LAB — BASIC METABOLIC PANEL
Anion gap: 10 (ref 5–15)
BUN: 23 mg/dL (ref 8–23)
CO2: 19 mmol/L — ABNORMAL LOW (ref 22–32)
Calcium: 9 mg/dL (ref 8.9–10.3)
Chloride: 111 mmol/L (ref 98–111)
Creatinine, Ser: 1.46 mg/dL — ABNORMAL HIGH (ref 0.44–1.00)
GFR, Estimated: 35 mL/min — ABNORMAL LOW (ref >60)
Glucose, Bld: 126 mg/dL — ABNORMAL HIGH (ref 70–99)
Potassium: 3.7 mmol/L (ref 3.5–5.1)
Sodium: 140 mmol/L (ref 135–145)

## 2023-10-11 LAB — GLUCOSE, CAPILLARY
Glucose-Capillary: 130 mg/dL — ABNORMAL HIGH (ref 70–99)
Glucose-Capillary: 150 mg/dL — ABNORMAL HIGH (ref 70–99)
Glucose-Capillary: 117 mg/dL — ABNORMAL HIGH (ref 70–99)
Glucose-Capillary: 114 mg/dL — ABNORMAL HIGH (ref 70–99)

## 2023-10-11 LAB — MAGNESIUM: Magnesium: 2.5 mg/dL — ABNORMAL HIGH (ref 1.7–2.4)

## 2023-10-11 MED ORDER — SODIUM CHLORIDE 0.9 % IV SOLN: INTRAVENOUS | Status: DC

## 2023-10-11 MED ORDER — CLOPIDOGREL BISULFATE 75 MG PO TABS: 75.0000 mg | ORAL_TABLET | Freq: Every day | ORAL | Status: DC

## 2023-10-11 MED ORDER — CLOPIDOGREL BISULFATE 75 MG PO TABS: 300.0000 mg | ORAL_TABLET | Freq: Once | ORAL | Status: DC

## 2023-10-11 MED ORDER — CLOPIDOGREL BISULFATE 75 MG PO TABS
75.0000 mg | ORAL_TABLET | Freq: Every day | ORAL | Status: DC
  Administered 2023-10-11 – 2023-10-14 (×4): 75 mg via ORAL
  Filled 2023-10-11 (×4): qty 1

## 2023-10-11 MED ORDER — ATORVASTATIN CALCIUM 40 MG PO TABS
40.0000 mg | ORAL_TABLET | Freq: Every day | ORAL | Status: DC
  Administered 2023-10-11 – 2023-10-14 (×4): 40 mg via ORAL
  Filled 2023-10-11 (×4): qty 1

## 2023-10-11 MED ORDER — ASPIRIN 325 MG PO TABS: 325.0000 mg | ORAL_TABLET | Freq: Every day | ORAL | Status: DC

## 2023-10-11 MED ORDER — NYSTATIN 100000 UNIT/ML MT SUSP
5.0000 mL | Freq: Four times a day (QID) | OROMUCOSAL | Status: DC
  Administered 2023-10-11 – 2023-10-14 (×11): 500000 [IU] via ORAL
  Filled 2023-10-11 (×11): qty 5

## 2023-10-11 MED ORDER — ASPIRIN 81 MG PO CHEW
81.0000 mg | CHEWABLE_TABLET | Freq: Once | ORAL | Status: AC
  Administered 2023-10-11: 81 mg via ORAL
  Filled 2023-10-11: qty 1

## 2023-10-11 NOTE — Plan of Care (Signed)
  Problem: Fluid Volume: Goal: Ability to maintain a balanced intake and output will improve Outcome: Progressing   Problem: Skin Integrity: Goal: Risk for impaired skin integrity will decrease Outcome: Progressing   Problem: Tissue Perfusion: Goal: Adequacy of tissue perfusion will improve Outcome: Progressing   Problem: Activity: Goal: Risk for activity intolerance will decrease Outcome: Progressing   Problem: Nutrition: Goal: Adequate nutrition will be maintained Outcome: Progressing   Problem: Coping: Goal: Level of anxiety will decrease Outcome: Progressing   Problem: Elimination: Goal: Will not experience complications related to bowel motility Outcome: Progressing   Problem: Safety: Goal: Ability to remain free from injury will improve Outcome: Progressing   Problem: Skin Integrity: Goal: Risk for impaired skin integrity will decrease Outcome: Progressing

## 2023-10-11 NOTE — Progress Notes (Addendum)
PROGRESS NOTE    Diana Harper  ZOX:[id number] DOB: 1935/12/20 DOA: 10/08/2023 PCP: Lewis Moccasin, MD   Brief Narrative:  Diana Harper is a 87 y.o. female with medical history significant of multiple medical problems including CKD stage IIIa, asthma, hyperlipidemia, hypertension, CAD was brought into the emergency department with decreased appetite, weakness, altered mental status and slurred speech since 2 to 3 days.  History was mostly obtained from the daughter who is at the bedside.  Patient has dementia and typically is only oriented to herself, her date of birth and family members around her.  Reportedly, since Wednesday, patient has been moving less often, has been speaking less often, has been eating less often as well.  She has been noted to have some more confusion than her baseline based on the dementia.    Upon arrival to ED, she was significantly hypertensive.  Initially, she was not following commands.  However when I saw her, patient was able to follow commands and according to daughter, patient had improved significantly over the course of last 2 to 3 hours that she had been in the ED.  She was able to recognize her daughter at the bedside.  Her speech was slightly more fluent compared to earlier although not back to baseline.  She had mild AKI on CKD stage IIIa with mild hypokalemia.  CT head and chest x-ray unremarkable.  Hospitalist were called for further admission.  Further details below.  Assessment & Plan:   Principal Problem:   AMS (altered mental status) Active Problems:   Acute renal failure superimposed on stage 3a chronic kidney disease (HCC)   Hypothyroidism   Uncontrolled type 2 diabetes mellitus with hyperglycemia, without long-term current use of insulin (HCC)   Hypokalemia   Acute ischemic stroke (HCC)   Hypertensive urgency   Dementia without behavioral disturbance (HCC)  Acute metabolic encephalopathy/generalized weakness/multiple acute strokes,  likely embolic: CT head negative at the time of admission.  Patient became more alert and speech was more fluent next day of hospitalization but she still appeared dehydrated so IV fluids were continued.  However on the morning of 10/10/2023, patient appeared to have word finding difficulty.  This raised suspicion for stroke.  MRI with imminent discharge was ordered.  PT OT consultation was ordered at the time of admission but patient was still not seen.  Sent a message to PT to see this patient as soon as possible.  During their evaluation, patient was noted to have some left-sided neglect as well.  MRI that was ordered was finally completed and resulted on the morning of 10/11/2023 which showed multiple embolic strokes.  Consulted neurology, discussed with Dr. Otelia Limes on-call who reviewed patient's chart and recommended transfer to [facility].  I ordered a dose of aspirin, Plavix as well as atorvastatin 40 mg p.o. daily.  Initiated transfer and called/updated patient's daughter Diana Harper over the phone call.  Hyperlipidemia: Significantly elevated cholesterol, triglyceride and LDL but due to patient having listed allergy to statins, discussed this with her daughter over the phone on 10/09/2023 who was hesitant to start her on statin since she was not aware of the type of allergy.  Since at that time, stroke was unknown, starting statin was not a priority so we mutually decided to hold after that but now that she has a stroke, I have started her on atorvastatin.  We will be monitoring her for any sort of allergy but her allergy is only hives listed.  Dehydration/acute on chronic kidney  disease stage IIIb: Received IV fluids.  Creatinine back to baseline.   Hypertensive urgency: Blood pressure elevated upon admission but improved from admission but still elevated.  Per pharmacy tech, patient was not taking Coreg so that was discontinued.  Spoke to the daughter, per her, she takes some medications for blood  pressure but she could not tell me the name.  She was supposed to bring list of medications to the hospital yesterday but I have not received that list yet.  Due to elevated blood pressure, I started her on Coreg 6.25 mg p.o. twice daily, 48 hours after presentation to the ED.  Gradually lower blood pressure now.  Dysphagia: SLP on board.  Patient started on dysphagia 1 diet.  Oral candidiasis: Nystatin swish and swallow.   Hypokalemia/hypomagnesemia: Replenished and normal today.   Acquired hypothyroidism: Continue Synthroid.   Hyperlipidemia: Resume statin.   Asthma, mild intermittent: Completely asymptomatic.  Resume albuterol inhaler.   History of CAD: Continue Plavix.  She is asymptomatic.   History of depression: Resume Zoloft.   Type 2 diabetes mellitus: Recent hemoglobin A1c in chart was 7.7 and 6.1 this hospitalization.  She does not appear to be on medications for this.  Continue SSI.  DVT prophylaxis: enoxaparin (LOVENOX) injection 30 mg Start: 10/08/23 1800   Code Status: Full Code  Family Communication:  None present at bedside.  Plan of care discussed with patient's daughter Diana Harper over the phone again today in detail.  Status is: Inpatient Remains inpatient appropriate because: New stroke, transferring [facility]    Estimated body mass index is 22.06 kg/m as calculated from the following:   Height as of this encounter: 5\' 6"  (1.676 m).   Weight as of this encounter: 62 kg.    Nutritional Assessment: Body mass index is 22.06 kg/m.[name] Seen by dietician.  I agree with the assessment and plan as outlined below: Nutrition Status:        . Skin Assessment: I have examined the patient's skin and I agree with the wound assessment as performed by the wound care RN as outlined below:    Consultants:  None  Procedures:  None  Antimicrobials:  Anti-infectives (From admission, onward)    None         Subjective: Patient seen and examined.  This  morning she is alert as she has been but she continues to have dysarthria and she has very obvious left facial droop and has left-sided weakness as well.  Objective: Vitals:   10/11/23 0151 10/11/23 0521 10/11/23 0832 10/11/23 0959  BP: (!) 145/104 (!) 160/75 (!) 192/97 (!) 185/87  Pulse: 78 79 76 72  Resp:  18 17   Temp:  98 F (36.7 C) 97.7 F (36.5 C)   TempSrc:  Axillary    SpO2:  100% (!) 86% 100%  Weight:      Height:        Intake/Output Summary (Last 24 hours) at 10/11/2023 1047 Last data filed at 10/10/2023 1959 Gross per 24 hour  Intake 50 ml  Output --  Net 50 ml   Filed Weights   10/08/23 1330  Weight: 62 kg    Examination:  General exam: Appears calm and comfortable  Respiratory system: Clear to auscultation. Respiratory effort normal. Cardiovascular system: S1 & S2 heard, RRR. No JVD, murmurs, rubs, gallops or clicks. No pedal edema. Gastrointestinal system: Abdomen is nondistended, soft and nontender. No organomegaly or masses felt. Normal bowel sounds heard. Central nervous system: Alert and  oriented x 1.  Dysarthria, 4/5 power in left upper and lower extremity.  Left-sided neglect.    Data Reviewed: I have personally reviewed following labs and imaging studies  CBC: Recent Labs  Lab 10/08/23 1328 10/08/23 1900 10/10/23 0903  WBC 6.7 6.3 6.0  NEUTROABS 4.0  --  3.8  HGB 11.7* 10.4* 11.2*  HCT 34.0* 31.3* 33.0*  MCV 85.9 87.2 85.7  PLT 219 215 226   Basic Metabolic Panel: Recent Labs  Lab 10/08/23 1328 10/08/23 1900 10/09/23 1310 10/10/23 0903 10/11/23 0518  NA 141  --  141 141 140  K 3.3*  --  3.7 2.9* 3.7  CL 105  --  109 109 111  CO2 27  --  23 22 19*  GLUCOSE 121*  --  107* 107* 126*  BUN 25*  --  23 19 23   CREATININE 1.89* 1.81* 1.59* 1.37* 1.46*  CALCIUM 9.3  --  8.8* 8.6* 9.0  MG  --   --   --  1.6* 2.5*   GFR: Estimated Creatinine Clearance: 26.9 mL/min (A) (by C-G formula based on SCr of 1.46 mg/dL (H)). Liver Function  Tests: Recent Labs  Lab 10/08/23 1328  AST 17  ALT 16  ALKPHOS 59  BILITOT 1.0  PROT 7.0  ALBUMIN 3.6   No results for input(s): "LIPASE", "AMYLASE" in the last 168 hours. Recent Labs  Lab 10/08/23 1328  AMMONIA 16   Coagulation Profile: No results for input(s): "INR", "PROTIME" in the last 168 hours. Cardiac Enzymes: No results for input(s): "CKTOTAL", "CKMB", "CKMBINDEX", "TROPONINI" in the last 168 hours. BNP (last 3 results) No results for input(s): "PROBNP" in the last 8760 hours. HbA1C: Recent Labs    10/08/23 1900  HGBA1C 6.1*   CBG: Recent Labs  Lab 10/10/23 0744 10/10/23 1142 10/10/23 1729 10/10/23 2123 10/11/23 0737  GLUCAP 117* 112* 115* 129* 130*   Lipid Profile: Recent Labs    10/09/23 0543  CHOL 236*  HDL 28*  LDLCALC 164*  TRIG 218*  CHOLHDL 8.4   Thyroid Function Tests: Recent Labs    10/08/23 1530  TSH 0.792   Anemia Panel: No results for input(s): "VITAMINB12", "FOLATE", "FERRITIN", "TIBC", "IRON", "RETICCTPCT" in the last 72 hours. Sepsis Labs: No results for input(s): "PROCALCITON", "LATICACIDVEN" in the last 168 hours.  No results found for this or any previous visit (from the past 240 hour(s)).   Radiology Studies: MR BRAIN WO CONTRAST  Result Date: 10/11/2023 CLINICAL DATA:  Initial evaluation for neuro deficit, stroke suspected. EXAM: MRI HEAD WITHOUT CONTRAST TECHNIQUE: Multiplanar, multiecho pulse sequences of the brain and surrounding structures were obtained without intravenous contrast. COMPARISON:  Prior CT from 10/08/2023 and MRI from 04/18/2023. FINDINGS: Brain: Generalized age-related cerebral atrophy. Extensive T2/FLAIR hyperintensity involving the periventricular and deep white matter of both cerebral hemispheres, consistent with advanced chronic microvascular ischemic disease. Patchy FLAIR signal abnormality involving the bilateral parieto-occipital regions noted, stable from prior MRI, and presumably related to  chronic microvascular ischemic changes as well. Few remote lacunar infarcts present about the bilateral basal ganglia, pons, and right cerebellum. 1.6 cm acute ischemic infarct present at the right basal ganglia (series 5, image 31). Additional 1.1 cm acute to early subacute ischemic infarct present at the contralateral left basal ganglia (series 5, image 29). Few punctate foci of diffusion signal abnormality within the right thalamus, also consistent with acute to early subacute ischemic changes (series 5, image 26). No associated hemorrhage or mass effect. No other evidence for  acute or subacute ischemia. No acute intracranial hemorrhage. Multiple scattered chronic micro hemorrhages noted, most pronounced at the left aspect of the splenium, likely hypertensive in nature. No mass lesion, midline shift or mass effect. No hydrocephalus or extra-axial fluid collection. Pituitary gland suprasellar region within normal limits. Vascular: Major intracranial vascular flow voids are maintained. Skull and upper cervical spine: Craniocervical junction within normal limits. Moderate to advanced multilevel spondylosis noted within the upper cervical spine, most pronounced at C3-4 where there is mild-to-moderate spinal stenosis. Bone marrow signal intensity within normal limits. No scalp soft tissue abnormality. Sinuses/Orbits: Prior bilateral ocular lens replacement. Paranasal sinuses are largely clear. No significant mastoid effusion. Other: None. IMPRESSION: 1. 1.6 cm acute ischemic nonhemorrhagic right basal ganglia infarct. 2. 1.1 cm acute to early subacute ischemic nonhemorrhagic left basal ganglia infarct. 3. Additional punctate acute to early subacute ischemic nonhemorrhagic right thalamic infarcts. 4. Underlying age-related cerebral atrophy with advanced chronic microvascular ischemic disease, with a few remote lacunar infarcts involving the bilateral basal ganglia, pons, and right cerebellum. Electronically Signed    By: Rise Mu M.D.   On: 10/11/2023 07:21    Scheduled Meds:  ARIPiprazole  2 mg Oral Daily   atorvastatin  40 mg Oral Daily   carvedilol  6.25 mg Oral BID   cholecalciferol  5,000 Units Oral Daily   clopidogrel  75 mg Oral Daily   enoxaparin (LOVENOX) injection  30 mg Subcutaneous Q24H   insulin aspart  0-15 Units Subcutaneous TID WC   insulin aspart  0-5 Units Subcutaneous QHS   levothyroxine  88 mcg Oral QAC breakfast   nystatin  5 mL Oral QID   sertraline  150 mg Oral QHS   Continuous Infusions:     LOS: 1 day   Hughie Closs, MD [facility]  10/11/2023, 10:47 AM   *Please note that this is a verbal dictation therefore any spelling or grammatical errors are due to the "[employer]agon [employer] One" system interpretation.  Please page via Amion and do not message via secure chat for urgent patient care matters. Secure chat can be used for non urgent patient care matters.  How to contact the [facility] Attending or Consulting provider 7A - 7P or covering provider during after hours 7P -7A, for this patient?  Check the care team in [employer] and look for a) attending/consulting [facility] provider listed and b) the [facility] team listed. Page or secure chat 7A-7P. Log into [email or web address] and use [place] universal password to access. If you do not have the password, please contact the hospital operator. Locate the [facility] provider you are looking for under [facility] and page to a number that you can be directly reached. If you still have difficulty reaching the provider, please page the [facility] (Director on Call) for the Hospitalists listed on amion for assistance.

## 2023-10-11 NOTE — Progress Notes (Signed)
Speech Language Pathology Treatment: (P) Dysphagia  Patient Details Name: Diana Harper MRN: [id number] DOB: [birth year] Today's Date: 10/11/2023 Time: (P) 1005-(P) 1050 SLP Time Calculation (min) (ACUTE ONLY): (P) 45 min  Assessment / Plan / Recommendation Clinical Impression  Pt seen for follow up regarding swallowing ability - note pt with new CVA per MRI - impacting basal ganglia.   Pt required significant cueing to wake fully -    She did allow SLP to provide oral care - white coating on tongue noted that did not clear with oral care/brushing.  Dentures placed for eating - with pt requiring encouragement.  With her facial asymmetry on the left - placement was mildly difficult - however adequate fit once present.  PO offerings included water, nectar thick soda, grits, sausage, graham racker bolus and applesauce.  Wincing noted with swallowing - concerning for potential oral candidiasis impacting her swallowing, though she denies issues with odynophagia.  Pt willing to consume intake including items listed above-  clinically she appeared with prolonged oral holding -benefiting from max to total visual/verbal cues to move lingual musculature to swallow.     Pt helping hand over hand required total assist and at times pushes against SLP while bringing cup to her mouth.  With placement in hand, double cup for strength, she fed herself x3 - but delay in swallow continued.  Mastication attempts were grossly impaired with pt essentially accepting 1/2 inch size bite with prolonged holding - requiring applesauce to facilitate clearance.   She is not appropriate for advanced textures at this time.  At times, pt is not eliciting swallow unless cued to swallow - thus her aspiration risk is elevated.    Overt cough x1 with thin water boluses x2 - and this is with self feeding and optimal positioning.  Clinically judged better tolerance of nectar liquids - thus will modify diet for airway protection.  Pt was  very alert for approximately 70% of session, yet delay in swallow continues.  Facial nerve deficits noted as pt with obvious left facial asymmetry.  When she did articulate without aphasia, her speech was clear.   She would benefit from [facility] when fully alert to assure she is maximizing her airway protection.   Signs left in room and copied for pt's soft chart as she is to transfer to [facility] today.       HPI HPI: (P) Diana Harper is a 87 y.o. female with medical history significant of multiple medical problems including CKD stage IIIa, asthma, hyperlipidemia, hypertension, CAD was brought into the emergency department with decreased appetite, weakness, altered mental status and slurred speech.  History was mostly obtained from the daughter who is at the bedside.  Patient has dementia and typically is only oriented to herself, her date of birth and family members around her.  Reportedly, since Wednesday, patient has been moving less often, has been speaking less often, has been eating less often as well.  Pt with worsening weakness and left inattention - MRI conducted and ptr found to have bilateral basal ganglia CVA, nonhemorrhagic - additional pons and right cerebellum lacunar cva.  Follow up for swallowing indicated.      SLP Plan  (P) Continue with current plan of care;MBS (pt to transfer to [facility])      Recommendations for follow up therapy are one component of a multi-disciplinary discharge planning process, led by the attending physician.  Recommendations may be updated based on patient status, additional functional criteria and insurance authorization.  Recommendations  Diet recommendations: (P) Dysphagia 1 (puree);Nectar-thick liquid (thin via tsp) Liquids provided via: (P) Cup;Straw;Teaspoon Medication Administration: (P) Crushed with puree Supervision: (P) Full supervision/cueing for compensatory strategies Compensations: (P) Slow rate;Small sips/bites (verbally and visually cue pt to  swallow) Postural Changes and/or Swallow Maneuvers: (P) Seated upright 90 degrees;Upright 30-60 min after meal                  (P) Oral care BID   (P) Frequent or constant Supervision/Assistance (P) Dysphagia, oropharyngeal phase (R13.12);Dysphagia, unspecified (R13.10)     (P) Continue with current plan of care;MBS (pt to transfer to [facility])    Rolena Infante, MS [facility] SLP Acute Rehab Services Office [phone]  Chales Abrahams  10/11/2023, 11:02 AM

## 2023-10-11 NOTE — Evaluation (Signed)
Speech Language Pathology Evaluation Patient Details Name: Diana Harper MRN: [id number] DOB: 09-06-1936 Today's Date: 10/11/2023 Time: 1005-1050 SLP Time Calculation (min) (ACUTE ONLY): 45 min  Problem List:  Patient Active Problem List   Diagnosis Date Noted   Hypertensive urgency 10/08/2023   Dementia without behavioral disturbance (HCC) 10/08/2023   Acute ischemic stroke (HCC) 04/18/2023   Myelopathy (HCC) 01/09/2023   Acute metabolic encephalopathy 09/15/2022   Vitamin B12 deficiency 09/15/2022   Thiamine deficiency 09/15/2022   Back pain 09/15/2022   CKD (chronic kidney disease), stage IV (HCC) 09/15/2022   Hypokalemia 09/15/2022   AMS (altered mental status) 09/12/2022   C. difficile colitis 07/25/2022   Normocytic anemia 07/24/2022   Uncontrolled type 2 diabetes mellitus with hyperglycemia, without long-term current use of insulin (HCC) 07/17/2022   Acute diverticulitis 04/06/2021   CKD stage 3 due to type 2 diabetes mellitus (HCC) 04/06/2021   Essential hypertension 04/06/2021   Hypothyroidism    Depression    COPD (chronic obstructive pulmonary disease) (HCC)    Anorexia 02/11/2021   Bloating symptom 02/11/2021   History of colon polyps 02/09/2021   Hemorrhoids 02/09/2021   Rectal bleeding 02/09/2021   Fecal smearing 02/09/2021   Acute renal failure superimposed on stage 3a chronic kidney disease (HCC) 12/13/2019   Acute kidney injury (HCC) 12/12/2019   Diarrhea 12/12/2019   Dehydration 12/12/2019   Cerebral thrombosis with cerebral infarction 10/10/2017   Cerebral embolism with cerebral infarction 10/10/2017   Subarachnoid hemorrhage 10/10/2017   Intracerebral hemorrhage 10/10/2017   CVA (cerebral vascular accident) (HCC) 10/10/2017   Chronic cholecystitis with calculus 08/25/2016   Syncope 11/15/2011   Diverticulitis 11/15/2011   Past Medical History:  Past Medical History:  Diagnosis Date   Anemia    Anxiety    Arthritis    Asthma    Biliary  colic    C. difficile colitis 08/2022   Cholelithiasis    CKD (chronic kidney disease), stage III [facility])    nephrologist--  dr Reynolds Bowl   Colon polyps    Diverticulosis of colon    Gastric ulcer    Glaucoma    History of asthma    1980's  no longer problem since 1980's   History of diverticulitis of colon    recurrent--  2014;  2013;  2012   Hyperlipidemia    Hypertension    Hypothyroidism    Insulin dependent type 2 diabetes mellitus (HCC)    followed by dr Jonny Ruiz lambeth ([facility])    Murmur    Peripheral neuropathy    Stroke [facility])    Vitamin D deficiency    Wears hearing aid    bilateral   Wears partial dentures    lower partial and upper full   Past Surgical History:  Past Surgical History:  Procedure Laterality Date   ABDOMINAL HYSTERECTOMY  1970's   CARDIOVASCULAR STRESS TEST  07/30/2009   normal exercise lexiscan nuclear study w/ no ischemia/  normal LV funciton and wall motion, ef 77%   CATARACT EXTRACTION W/ INTRAOCULAR LENS  IMPLANT, BILATERAL  2014   LAPAROSCOPIC CHOLECYSTECTOMY SINGLE SITE WITH INTRAOPERATIVE CHOLANGIOGRAM N/A 08/25/2016   Procedure: LAPAROSCOPIC CHOLECYSTECTOMY WITH INTRAOPERATIVE CHOLANGIOGRAM;  Surgeon: Rodman Pickle, MD;  Location: [facility];  Service: General;  Laterality: N/A;   LUMBAR SPINE SURGERY  2014   REMOVAL AXILLA CYST Right 1990's   HPI:  Diana Harper is a 87 y.o. female with medical history significant of multiple medical problems including CKD stage IIIa, asthma,  hyperlipidemia, hypertension, CAD was brought into the emergency department with decreased appetite, weakness, altered mental status and slurred speech.  History was mostly obtained from the daughter who is at the bedside.  Patient has dementia and typically is only oriented to herself, her date of birth and family members around her.  Reportedly, since Wednesday, patient has been moving less often, has been speaking less often, has been eating less  often as well.  Pt with worsening weakness and left inattention - MRI conducted and ptr found to have bilateral basal ganglia CVA, nonhemorrhagic - additional pons and right cerebellum lacunar cva.  Follow up for swallowing indicated.   Assessment / Plan / Recommendation Clinical Impression  Limited speech evaluation conducted- Predominantly pt appears with language deficits.  She demonstrates difficulties following one step directions and benefits from verbal/visual cues to follow directions and even then her response was inconsistent.  Facial, trigeminal nerve impacted at least.  She did answer single yes/no question correctly to this SLP re: SLP's sex.  She did not answer to location even with verbal choice of two- later in session after she was informed.  Articulation was clear "I hope you are"...   thus pt's deficits are primarily from cognitive linguistic deficits and aphasia - not actual articulation. Appears with significant left inattention.   Pt does have h/o dementia and no family present to establish baseline.   She will benefit from further cog ling diagnostic/treatment during SLP sessions to maximize her rehab.    SLP Assessment  SLP Recommendation/Assessment: Patient needs continued Speech Lanaguage Pathology Services SLP Visit Diagnosis: Aphasia (R47.01)    Recommendations for follow up therapy are one component of a multi-disciplinary discharge planning process, led by the attending physician.  Recommendations may be updated based on patient status, additional functional criteria and insurance authorization.    Follow Up Recommendations  Follow physician's recommendations for discharge plan and follow up therapies    Assistance Recommended at Discharge  Frequent or constant Supervision/Assistance  Functional Status Assessment Patient has had a recent decline in their functional status and demonstrates the ability to make significant improvements in function in a reasonable and  predictable amount of time.  Frequency and Duration min 1 x/week  1 week      SLP Evaluation Cognition  Overall Cognitive Status: Impaired/Different from baseline (no family present to establish baseline) Arousal/Alertness: Awake/alert (became sleeping during session) Year:  (did not verbalize) Month:  (pt did not verbalize) Attention: Sustained Focused Attention: Impaired Focused Attention Impairment: Functional basic Sustained Attention: Impaired Sustained Attention Impairment: Verbal complex Safety/Judgment: Impaired       Comprehension  Auditory Comprehension Overall Auditory Comprehension: Impaired Yes/No Questions:  (only answered appropriately to one question - but was correct) Commands: Impaired One Step Basic Commands: 0-24% accurate Interfering Components: Attention;Processing speed;Motor planning EffectiveTechniques: Repetition;Increased volume Visual Recognition/Discrimination Discrimination: Not tested Reading Comprehension Reading Status: Not tested    Expression Expression Primary Mode of Expression: Verbal Verbal Expression Overall Verbal Expression: Impaired Initiation: Impaired Level of Generative/Spontaneous Verbalization: Word Repetition: Impaired Level of Impairment: Word level Naming: Not tested Pragmatics: Unable to assess Interfering Components: Attention Non-Verbal Means of Communication: Not applicable Written Expression Dominant Hand: Right (presumed right as she was reaching to SLP with her right hand - and attempting to hold cup - rather tight closure - and resistance to SLP attempting to help bring cup  to her mouth)   Oral / Motor  Oral Motor/Sensory Function Overall Oral Motor/Sensory Function: Mild impairment Facial ROM:  Reduced left Facial Symmetry: Abnormal symmetry left Facial Strength: Reduced left Facial Sensation: Other (Comment) (weak) Lingual ROM: Other (Comment) (weak) Lingual Symmetry: Other (Comment) (weak) Lingual  Strength: Suspected CN XII (hypoglossal) dysfunction Lingual Sensation: Suspected CN VII (facial) dysfunction-anterior 2/3 tongue Velum: Other (comment) Motor Speech Overall Motor Speech: Impaired Respiration: Impaired Phonation: Normal Resonance: Within functional limits Articulation: Within functional limitis Motor Planning: Impaired Level of Impairment: Phrase Motor Speech Errors:  (unable to determine)    Rolena Infante, MS [facility] SLP Acute Rehab Services Office [phone]         Chales Abrahams 10/11/2023, 11:39 AM

## 2023-10-11 NOTE — Consult Note (Signed)
NEUROLOGY CONSULT NOTE   Date of service: October 11, 2023 Patient Name: Diana Harper MRN:  [id number] DOB:  11-13-1936 Chief Complaint: "AMS, weakness, slurred speech" Requesting Provider: Hughie Closs, MD  History of Present Illness  Diana Harper is an 87 y.o. female with PMH significant for Dementia, CKD 3, HLD, HTN, DM2, CAD, Hypothyroidism, Peripheral Neuropathy, Vitamin D deficiency, Asthma, Anxiety, Hearing Aid who presented to [facility] ED 11/9 due to decreased appetite, weakness, AMS, slurred speech x 2-3 days. Patient has dementia at baseline and is oriented to self, DOB and some family members. She was hypertensive on presentation to ED. She has been missing doses of several of her meds for the past 2 weeks, often spitting out the pills that have been given to her. Her diurnal cycles have also been altered due to poor sleep hygiene habits including viewing her iPhone at night well past bedtime. Home medications include Plavix and carvedilol.   MRI was obtained revealing bilateral basal ganglia acute infarcts. Patient transferred to [facility] for stroke work up and Neurology consult. Patient placed on aspirin, plavix and atorvastatin 40mg . Patient's LKW puts her outside of the window for TNK or mechanical thrombectomy.    On exam, patient oriented to self only, answers yes/no questions inconsistently, aphasic, does not follow commands but does mimic holding arms up and smiling. Generalized weakness is present. She has right gaze preference but does cross midline.   Daughter and grand-daughter at bedside. Assessment and plan of care discussed.    ROS   Unable to ascertain due to altered mental status.   Past History   Past Medical History:  Diagnosis Date   Anemia    Anxiety    Arthritis    Asthma    Biliary colic    C. difficile colitis 08/2022   Cholelithiasis    CKD (chronic kidney disease), stage III [facility])    nephrologist--  dr Reynolds Bowl   Colon polyps     Diverticulosis of colon    Gastric ulcer    Glaucoma    History of asthma    1980's  no longer problem since 1980's   History of diverticulitis of colon    recurrent--  2014;  2013;  2012   Hyperlipidemia    Hypertension    Hypothyroidism    Insulin dependent type 2 diabetes mellitus (HCC)    followed by dr Jonny Ruiz lambeth ([facility])    Murmur    Peripheral neuropathy    Stroke [facility])    Vitamin D deficiency    Wears hearing aid    bilateral   Wears partial dentures    lower partial and upper full    Past Surgical History:  Procedure Laterality Date   ABDOMINAL HYSTERECTOMY  1970's   CARDIOVASCULAR STRESS TEST  07/30/2009   normal exercise lexiscan nuclear study w/ no ischemia/  normal LV funciton and wall motion, ef 77%   CATARACT EXTRACTION W/ INTRAOCULAR LENS  IMPLANT, BILATERAL  2014   LAPAROSCOPIC CHOLECYSTECTOMY SINGLE SITE WITH INTRAOPERATIVE CHOLANGIOGRAM N/A 08/25/2016   Procedure: LAPAROSCOPIC CHOLECYSTECTOMY WITH INTRAOPERATIVE CHOLANGIOGRAM;  Surgeon: Rodman Pickle, MD;  Location: [facility];  Service: General;  Laterality: N/A;   LUMBAR SPINE SURGERY  2014   REMOVAL AXILLA CYST Right 1990's    Family History: Family History  Problem Relation Age of Onset   Stroke Mother    Colon polyps Mother    Diabetes Mother    Diabetes Father    Kidney failure Father  COPD Father    Colon polyps Sister    Diabetes Sister    Stroke Brother    Diabetes Brother    Heart attack Maternal Grandmother    Heart disease Maternal Grandfather    Diabetes Paternal Grandmother    Stroke Paternal Grandfather    Hypertension Paternal Grandfather    Diabetes Sister    Diabetes Sister    Diabetes Son    Diabetes Daughter    Colon cancer Neg Hx    Esophageal cancer Neg Hx    Inflammatory bowel disease Neg Hx    Liver disease Neg Hx    Pancreatic cancer Neg Hx    Rectal cancer Neg Hx    Stomach cancer Neg Hx     Social History  reports that she has  never smoked. She has never used smokeless tobacco. She reports that she does not drink alcohol and does not use drugs.  Allergies  Allergen Reactions   Latex Anaphylaxis, Shortness Of Breath and Other (See Comments)    Severe respiratory distress   Onion Diarrhea   Other Other (See Comments)    Patient cannot have TREE NUTS and anything with seeds- History of  diverticulitis    Flexeril [Cyclobenzaprine] Other (See Comments)    Caused agitation   Lisinopril Cough   Metformin And Related Diarrhea   Simvastatin Hives and Rash   Tetracycline Rash   Zithromax [Azithromycin] Rash    Medications   Current Facility-Administered Medications:    acetaminophen (TYLENOL) tablet 650 mg, 650 mg, Oral, Q4H PRN **OR** acetaminophen (TYLENOL) 160 MG/5ML solution 650 mg, 650 mg, Per Tube, Q4H PRN **OR** acetaminophen (TYLENOL) suppository 650 mg, 650 mg, Rectal, Q4H PRN, Pahwani, Ravi, MD   albuterol (PROVENTIL) (2.5 MG/3ML) 0.083% nebulizer solution 3 mL, 3 mL, Inhalation, Q4H PRN, Pahwani, Ravi, MD   ARIPiprazole (ABILIFY) tablet 2 mg, 2 mg, Oral, Daily, Pahwani, Ravi, MD, 2 mg at 10/11/23 0841   atorvastatin (LIPITOR) tablet 40 mg, 40 mg, Oral, Daily, Pahwani, Ravi, MD, 40 mg at 10/11/23 0841   carvedilol (COREG) tablet 6.25 mg, 6.25 mg, Oral, BID, Pahwani, Ravi, MD, 6.25 mg at 10/11/23 0841   cholecalciferol (VITAMIN D3) 25 MCG (1000 UNIT) tablet 5,000 Units, 5,000 Units, Oral, Daily, Pahwani, Ravi, MD, 5,000 Units at 10/11/23 0841   clopidogrel (PLAVIX) tablet 75 mg, 75 mg, Oral, Daily, Pahwani, Ravi, MD, 75 mg at 10/11/23 0841   enoxaparin (LOVENOX) injection 30 mg, 30 mg, Subcutaneous, Q24H, Pahwani, Ravi, MD, 30 mg at 10/10/23 1741   hydrALAZINE (APRESOLINE) injection 10 mg, 10 mg, Intravenous, Q6H PRN, Pahwani, Ravi, MD, 10 mg at 10/11/23 1016   insulin aspart (novoLOG) injection 0-15 Units, 0-15 Units, Subcutaneous, TID WC, Pahwani, Ravi, MD, 2 Units at 10/11/23 1202   insulin aspart  (novoLOG) injection 0-5 Units, 0-5 Units, Subcutaneous, QHS, Pahwani, Ravi, MD   levothyroxine (SYNTHROID) tablet 88 mcg, 88 mcg, Oral, QAC breakfast, Pahwani, Ravi, MD, 88 mcg at 10/11/23 0522   nystatin (MYCOSTATIN) 100000 UNIT/ML suspension 500,000 Units, 5 mL, Oral, QID, Pahwani, Ravi, MD, 500,000 Units at 10/11/23 1202   senna-docusate (Senokot-S) tablet 1 tablet, 1 tablet, Oral, QHS PRN, Hughie Closs, MD   sertraline (ZOLOFT) tablet 150 mg, 150 mg, Oral, QHS, Pahwani, Ravi, MD, 150 mg at 10/10/23 2107  Vitals   Vitals:   10/11/23 0832 10/11/23 0959 10/11/23 1124 10/11/23 1254  BP: (!) 192/97 (!) 185/87 (!) 150/65 (!) 128/98  Pulse: 76 72 75 73  Resp: 17  18  16  Temp: 97.7 F (36.5 C)  97.9 F (36.6 C) 97.7 F (36.5 C)  TempSrc:      SpO2: (!) 86% 100% 99% 100%  Weight:      Height:        Body mass index is 22.06 kg/m.  Physical Exam   Constitutional: Appears frail, NAD.  Cardiovascular: Normal rate and regular rhythm.  Respiratory: Effort normal, non-labored breathing.   Neurologic Examination   Neuro: Mental Status: Patient is drowsy, oriented to self only Patient is not able to give a clear and coherent history. Aphasia present.  Cranial Nerves: II: difficult to assess VF fully.  III,IV, VI: right gaze preference but does cross midline. Does not track.  V: Facial sensation is symmetric to temperature VII: Facial movement is symmetric.  VIII: hearing is intact to voice X: Palate elevates symmetrically XI: Shoulder shrug is symmetric. XII: tongue is midline without atrophy or fasciculations.  Motor: Tone is normal. Bulk is normal. Generalized weakness, 4/5 strength was present in all four extremities.  Sensory: Sensation is subjectively symmetric to light touch in the arms and legs, difficult to fully assess Cerebellar: Unable to assess Gait: Deferred  NIHSS: 8  Labs/Imaging/Neurodiagnostic studies   CBC:  Recent Labs  Lab Nov 02, 2023 1328  11-02-2023 1900 10/10/23 0903  WBC 6.7 6.3 6.0  NEUTROABS 4.0  --  3.8  HGB 11.7* 10.4* 11.2*  HCT 34.0* 31.3* 33.0*  MCV 85.9 87.2 85.7  PLT 219 215 226    Basic Metabolic Panel:  Lab Results  Component Value Date   NA 140 10/11/2023   K 3.7 10/11/2023   CO2 19 (L) 10/11/2023   GLUCOSE 126 (H) 10/11/2023   BUN 23 10/11/2023   CREATININE 1.46 (H) 10/11/2023   CALCIUM 9.0 10/11/2023   GFRNONAA 35 (L) 10/11/2023   GFRAA 33 (L) 01/19/2021    Lipid Panel:  Lab Results  Component Value Date   LDLCALC 164 (H) 10/09/2023    HgbA1c:  Lab Results  Component Value Date   HGBA1C 6.1 (H) 2023/11/02    Urine Drug Screen:     Component Value Date/Time   LABOPIA NONE DETECTED 10/10/2017 1844   COCAINSCRNUR NONE DETECTED 10/10/2017 1844   LABBENZ NONE DETECTED 10/10/2017 1844   AMPHETMU NONE DETECTED 10/10/2017 1844   THCU NONE DETECTED 10/10/2017 1844   LABBARB NONE DETECTED 10/10/2017 1844     Alcohol Level     Component Value Date/Time   ETH <10 09/12/2022 1847    INR  Lab Results  Component Value Date   INR 1.1 09/12/2022    APTT  Lab Results  Component Value Date   APTT 30 09/12/2022    CT Head without contrast (Personally reviewed): No acute intracranial abnormality Stable advanced chronic ischemic disease.    MRI Brain(Personally reviewed): 1.6 cm acute ischemic nonhemorrhagic right basal ganglia infarct. 1.1 cm acute to early subacute ischemic nonhemorrhagic left basal ganglia infarct. Additional punctate acute to early subacute ischemic nonhemorrhagic right thalamic infarcts. Underlying age-related atrophy with advanced chronic microvascualr ischemic disease. Few remote lacunar infarcts involving bilateral basal ganglia, pons and right cerebellum.    Impression   Cadee Palser is a 87 y.o. female with PMH significant for Dementia, CKD 3, HLD, HTN, DM2, CAD, Hypothyroidism, Peripheral Neuropathy, Vitamin D deficiency, Asthma, Anxiety, Hearing  Aid who presented to [facility] ED 2023/11/02 due to decreased appetite, weakness, AMS, slurred speech x 2-3 days. Patient hs dementia at baseline and is oriented to self, DOB and  some family members. She was hypertensive on presentation to ED. MRI shows  bilateral basal ganglia infarcts.   Recommendations  - Frequent Neuro checks per stroke unit protocol - Vascular imaging - Carotid US and MRA (ordered) - TTE - Lipid panel - Placed on atorvastatin 40mg  - A1C - Antithrombotic - Aspirin and Plavix. Classifiable as having failed Plavix monotherapy.  - Glycemic control with SSI - DVT ppx - lovenox - Smoking cessation - will counsel patient - BP management per standard protocol. Out of the permissive HTN time window.  - Telemetry monitoring for arrhythmia - 72h - Swallow screen - will be performed prior to PO intake - Stroke education - will be given - IVF - PT/OT/SLP  ______________________________________________________________________   Pt seen by Neuro NP/APP and later by MD.     Lynnae January, DNP, AGACNP-BC [facility] Please use AMION for contact information & EPIC for messaging.    I have seen and examined the patient. I have formulated the assessment and recommendations. 87 year old female with subacute bilateral basal ganglia ischemic infarctions, occurring in the setting of poorly controlled HTN. Exam findings include drowsiness that is greater than her baseline, poor ability to follow commands, but no definite asymmetry on her motor exam in the context of generalized weakness. Recommendations as above.  Electronically signed: Dr. Caryl Pina

## 2023-10-11 NOTE — Consult Note (Deleted)
nnn

## 2023-10-12 ENCOUNTER — Inpatient Hospital Stay (HOSPITAL_COMMUNITY): Payer: Medicare Other

## 2023-10-12 DIAGNOSIS — I6523 Occlusion and stenosis of bilateral carotid arteries: Secondary | ICD-10-CM | POA: Diagnosis not present

## 2023-10-12 DIAGNOSIS — I6340 Cerebral infarction due to embolism of unspecified cerebral artery: Secondary | ICD-10-CM

## 2023-10-12 DIAGNOSIS — I639 Cerebral infarction, unspecified: Secondary | ICD-10-CM | POA: Diagnosis not present

## 2023-10-12 DIAGNOSIS — I6389 Other cerebral infarction: Secondary | ICD-10-CM

## 2023-10-12 DIAGNOSIS — R40244 Other coma, without documented Glasgow coma scale score, or with partial score reported, unspecified time: Secondary | ICD-10-CM | POA: Diagnosis not present

## 2023-10-12 DIAGNOSIS — R4182 Altered mental status, unspecified: Secondary | ICD-10-CM | POA: Diagnosis not present

## 2023-10-12 LAB — ECHOCARDIOGRAM COMPLETE
AR max vel: 1.36 cm2
AV Area VTI: 1.23 cm2
AV Area mean vel: 1.24 cm2
AV Mean grad: 9.5 mm[Hg]
AV Peak grad: 15.3 mm[Hg]
Ao pk vel: 1.96 m/s
Area-P 1/2: 5.16 cm2
Height: 66 in
S' Lateral: 3.1 cm
Weight: 2186.96 [oz_av]

## 2023-10-12 LAB — GLUCOSE, CAPILLARY
Glucose-Capillary: 78 mg/dL (ref 70–99)
Glucose-Capillary: 209 mg/dL — ABNORMAL HIGH (ref 70–99)
Glucose-Capillary: 65 mg/dL — ABNORMAL LOW (ref 70–99)
Glucose-Capillary: 79 mg/dL (ref 70–99)
Glucose-Capillary: 96 mg/dL (ref 70–99)

## 2023-10-12 MED ORDER — PERFLUTREN LIPID MICROSPHERE
1.0000 mL | INTRAVENOUS | Status: AC | PRN
  Administered 2023-10-12: 2 mL via INTRAVENOUS

## 2023-10-12 MED ORDER — ASPIRIN 81 MG PO TBEC
81.0000 mg | DELAYED_RELEASE_TABLET | Freq: Every day | ORAL | Status: DC
  Administered 2023-10-12 – 2023-10-14 (×3): 81 mg via ORAL
  Filled 2023-10-12 (×3): qty 1

## 2023-10-12 NOTE — TOC CM/SW Note (Signed)
    Durable Medical Equipment  (From admission, onward)           Start     Ordered   10/12/23 1550  For home use only DME Hospital bed  Once       Comments: Call daughter [name] [phone] for delivery  Question Answer Comment  Length of Need Lifetime   Patient has (list medical condition): Acute metabolic encephalopathy/generalized weakness/multiple acute strokes   The above medical condition requires: Patient requires the ability to reposition frequently   Head must be elevated greater than: 45 degrees   Bed type Semi-electric   Support Surface: Gel Overlay      10/12/23 1551

## 2023-10-12 NOTE — Plan of Care (Signed)
  Problem: Coping: Goal: Ability to adjust to condition or change in health will improve Outcome: Progressing   Problem: Fluid Volume: Goal: Ability to maintain a balanced intake and output will improve Outcome: Progressing   Problem: Metabolic: Goal: Ability to maintain appropriate glucose levels will improve Outcome: Progressing   Problem: Nutritional: Goal: Maintenance of adequate nutrition will improve Outcome: Progressing Goal: Progress toward achieving an optimal weight will improve Outcome: Progressing   Problem: Skin Integrity: Goal: Risk for impaired skin integrity will decrease Outcome: Progressing   Problem: Tissue Perfusion: Goal: Adequacy of tissue perfusion will improve Outcome: Progressing   Problem: Ischemic Stroke/TIA Tissue Perfusion: Goal: Complications of ischemic stroke/TIA will be minimized Outcome: Progressing   Problem: Nutrition: Goal: Risk of aspiration will decrease Outcome: Progressing Goal: Dietary intake will improve Outcome: Progressing   Problem: Clinical Measurements: Goal: Diagnostic test results will improve Outcome: Progressing Goal: Respiratory complications will improve Outcome: Progressing Goal: Cardiovascular complication will be avoided Outcome: Progressing   Problem: Activity: Goal: Risk for activity intolerance will decrease Outcome: Progressing   Problem: Nutrition: Goal: Adequate nutrition will be maintained Outcome: Progressing   Problem: Coping: Goal: Level of anxiety will decrease Outcome: Progressing   Problem: Elimination: Goal: Will not experience complications related to bowel motility Outcome: Progressing Goal: Will not experience complications related to urinary retention Outcome: Progressing   Problem: Pain Management: Goal: General experience of comfort will improve Outcome: Progressing   Problem: Safety: Goal: Ability to remain free from injury will improve Outcome: Progressing   Problem:  Skin Integrity: Goal: Risk for impaired skin integrity will decrease Outcome: Progressing

## 2023-10-12 NOTE — Progress Notes (Signed)
[facility] [id number] [facility] Liaison note:  This patient is currently enrolled in [facility] outpatient-based palliative care. Hospital Liaison will continue to follow for discharge disposition. Please call for any outpatient based palliative care related questions or concerns. Thank you, Thea Gist, BSN [facility] liaison [phone]

## 2023-10-12 NOTE — Progress Notes (Signed)
Speech Language Pathology Treatment: Dysphagia;Cognitive-Linquistic  Patient Details Name: Diana Harper MRN: [id number] DOB: 03/28/1936 Today's Date: 10/12/2023 Time: 4098-1191 SLP Time Calculation (min) (ACUTE ONLY): 22 min  Assessment / Plan / Recommendation Clinical Impression  Pt seen with dtr and RN at bedside. Pt progressively more alert and responsive as session progressed. Dtr reported pt has been refusing thickened liquids, just didn't initiate the way she normally would. SLP touched water to lips on cup edge pt immediately responded and took single cup sips. Then straw sips of thin water, one sip at a time with a brief oral hold. When offered straw sips of thin ginger ale pt took several ounces at once with a straw, no oral holding. No coughing or throat clearing after 6 oz of liquid given. Pt opened her eyes and became much more attentive after ginger ale. Placed upper denture only and pt fed herself a graham cracker with normal and timely mastication. Pt took small pills whole with ginger ale. She was inattentive to left, listening to her daughters phone call and making paraphasic comments a times. With question cues pt was able to complete responsive naming tasks correctly x5 of family members and pets. Pt described her dog with simple adjectives and cues. Her spontaneous language was still inaccurate, but her meaning was often clear as one or two words were often close to there target. Will advance diet to dys 3/thin with supervision and assist. SLP to f/u in home health setting after d/c.   HPI HPI: Diana Harper is a 87 y.o. female with medical history significant of multiple medical problems including CKD stage IIIa, asthma, hyperlipidemia, hypertension, CAD was brought into the emergency department with decreased appetite, weakness, altered mental status and slurred speech.  History was mostly obtained from the daughter who is at the bedside.  Patient has dementia and typically is  only oriented to herself, her date of birth and family members around her.  Reportedly, since Wednesday, patient has been moving less often, has been speaking less often, has been eating less often as well.  Pt with worsening weakness and left inattention - MRI conducted and ptr found to have bilateral basal ganglia CVA, nonhemorrhagic - additional pons and right cerebellum lacunar cva.  Follow up for swallowing indicated.      SLP Plan  Continue with current plan of care;MBS      Recommendations for follow up therapy are one component of a multi-disciplinary discharge planning process, led by the attending physician.  Recommendations may be updated based on patient status, additional functional criteria and insurance authorization.    Recommendations  Diet recommendations: Dysphagia 3 (mechanical soft);Thin liquid Liquids provided via: Cup;Straw Medication Administration: Whole meds with liquid Supervision: Full supervision/cueing for compensatory strategies Compensations: Slow rate;Small sips/bites Postural Changes and/or Swallow Maneuvers: Seated upright 90 degrees;Upright 30-60 min after meal                      Frequent or constant Supervision/Assistance Aphasia (R47.01)     Continue with current plan of care;MBS     Padraig Nhan, [name]  10/12/2023, 12:52 PM

## 2023-10-12 NOTE — Progress Notes (Signed)
STROKE TEAM PROGRESS NOTE   SUBJECTIVE (INTERVAL HISTORY) Her RN and daughter are at the bedside.  Overall her condition is unchanged. Pt reclining in bed, baseline dementia, orientated to people but not place or time. Right gaze preference but able to cross midline with left gaze, left UE neglect, left facial droop but moving extremities equally. Per daughter, pt has not taken her meds for 2 weeks as she refused all her meds.    OBJECTIVE Temp:  [97.7 F (36.5 C)-98.4 F (36.9 C)] 98.1 F (36.7 C) (11/13 0505) Pulse Rate:  [73-79] 78 (11/13 1124) Cardiac Rhythm: Normal sinus rhythm (11/13 0706) Resp:  [16-17] 17 (11/13 0505) BP: (128-200)/(67-98) 147/67 (11/13 1124) SpO2:  [100 %] 100 % (11/13 0505)  Recent Labs  Lab 10/11/23 0737 10/11/23 1125 10/11/23 1737 10/11/23 2128 10/12/23 0757  GLUCAP 130* 150* 117* 114* 78   Recent Labs  Lab 10/08/23 1328 10/08/23 1900 10/09/23 1310 10/10/23 0903 10/11/23 0518  NA 141  --  141 141 140  K 3.3*  --  3.7 2.9* 3.7  CL 105  --  109 109 111  CO2 27  --  23 22 19*  GLUCOSE 121*  --  107* 107* 126*  BUN 25*  --  23 19 23   CREATININE 1.89* 1.81* 1.59* 1.37* 1.46*  CALCIUM 9.3  --  8.8* 8.6* 9.0  MG  --   --   --  1.6* 2.5*   Recent Labs  Lab 10/08/23 1328  AST 17  ALT 16  ALKPHOS 59  BILITOT 1.0  PROT 7.0  ALBUMIN 3.6   Recent Labs  Lab 10/08/23 1328 10/08/23 1900 10/10/23 0903  WBC 6.7 6.3 6.0  NEUTROABS 4.0  --  3.8  HGB 11.7* 10.4* 11.2*  HCT 34.0* 31.3* 33.0*  MCV 85.9 87.2 85.7  PLT 219 215 226   No results for input(s): "CKTOTAL", "CKMB", "CKMBINDEX", "TROPONINI" in the last 168 hours. No results for input(s): "LABPROT", "INR" in the last 72 hours. No results for input(s): "COLORURINE", "LABSPEC", "PHURINE", "GLUCOSEU", "HGBUR", "BILIRUBINUR", "KETONESUR", "PROTEINUR", "UROBILINOGEN", "NITRITE", "LEUKOCYTESUR" in the last 72 hours.  Invalid input(s): "APPERANCEUR"     Component Value Date/Time   CHOL 236  (H) 10/09/2023 0543   TRIG 218 (H) 10/09/2023 0543   HDL 28 (L) 10/09/2023 0543   CHOLHDL 8.4 10/09/2023 0543   VLDL 44 (H) 10/09/2023 0543   LDLCALC 164 (H) 10/09/2023 0543   Lab Results  Component Value Date   HGBA1C 6.1 (H) 10/08/2023      Component Value Date/Time   LABOPIA NONE DETECTED 10/10/2017 1844   COCAINSCRNUR NONE DETECTED 10/10/2017 1844   LABBENZ NONE DETECTED 10/10/2017 1844   AMPHETMU NONE DETECTED 10/10/2017 1844   THCU NONE DETECTED 10/10/2017 1844   LABBARB NONE DETECTED 10/10/2017 1844    No results for input(s): "ETH" in the last 168 hours.  I have personally reviewed the radiological images below and agree with the radiology interpretations.  MR ANGIO HEAD WO CONTRAST  Result Date: 10/11/2023 CLINICAL DATA:  Follow-up examination for stroke. EXAM: MRA HEAD WITHOUT CONTRAST TECHNIQUE: Angiographic images of the Circle of Willis were acquired using MRA technique without intravenous contrast. COMPARISON:  Prior MRI from 10/10/2023. FINDINGS: Anterior circulation: Examination mildly degraded by motion artifact. Both internal carotid arteries are patent through the siphon to the termini without significant stenosis or other abnormality. Left A1 segment dominant and patent. Focal severe stenosis at the mid right A1 segment (series 352, image 1). Normal  anterior communicating artery complex. Partially visualized anterior cerebral arteries grossly patent without visible stenosis. No M1 stenosis or occlusion. No visible proximal MCA branch occlusion or high-grade stenosis. Distal MCA branches grossly perfused and symmetric. Posterior circulation: Both vertebral arteries patent to the vertebrobasilar junction without stenosis. Left vertebral artery dominant. Both PICA grossly patent at their origins. Basilar patent without stenosis. Superior cerebral arteries patent bilaterally. Both PCAs supplied via the basilar. Visualized PCAs are grossly patent to their distal aspects  without visible stenosis. Anatomic variants: As above. Other: No intracranial aneurysm. IMPRESSION: 1. Negative intracranial MRA for large vessel occlusion. 2. Focal severe stenosis at the mid right A1 segment. 3. Otherwise negative intracranial MRA. Electronically Signed   By: Rise Mu M.D.   On: 10/11/2023 20:53   MR BRAIN WO CONTRAST  Result Date: 10/11/2023 CLINICAL DATA:  Initial evaluation for neuro deficit, stroke suspected. EXAM: MRI HEAD WITHOUT CONTRAST TECHNIQUE: Multiplanar, multiecho pulse sequences of the brain and surrounding structures were obtained without intravenous contrast. COMPARISON:  Prior CT from 10/08/2023 and MRI from 04/18/2023. FINDINGS: Brain: Generalized age-related cerebral atrophy. Extensive T2/FLAIR hyperintensity involving the periventricular and deep white matter of both cerebral hemispheres, consistent with advanced chronic microvascular ischemic disease. Patchy FLAIR signal abnormality involving the bilateral parieto-occipital regions noted, stable from prior MRI, and presumably related to chronic microvascular ischemic changes as well. Few remote lacunar infarcts present about the bilateral basal ganglia, pons, and right cerebellum. 1.6 cm acute ischemic infarct present at the right basal ganglia (series 5, image 31). Additional 1.1 cm acute to early subacute ischemic infarct present at the contralateral left basal ganglia (series 5, image 29). Few punctate foci of diffusion signal abnormality within the right thalamus, also consistent with acute to early subacute ischemic changes (series 5, image 26). No associated hemorrhage or mass effect. No other evidence for acute or subacute ischemia. No acute intracranial hemorrhage. Multiple scattered chronic micro hemorrhages noted, most pronounced at the left aspect of the splenium, likely hypertensive in nature. No mass lesion, midline shift or mass effect. No hydrocephalus or extra-axial fluid collection. Pituitary  gland suprasellar region within normal limits. Vascular: Major intracranial vascular flow voids are maintained. Skull and upper cervical spine: Craniocervical junction within normal limits. Moderate to advanced multilevel spondylosis noted within the upper cervical spine, most pronounced at C3-4 where there is mild-to-moderate spinal stenosis. Bone marrow signal intensity within normal limits. No scalp soft tissue abnormality. Sinuses/Orbits: Prior bilateral ocular lens replacement. Paranasal sinuses are largely clear. No significant mastoid effusion. Other: None. IMPRESSION: 1. 1.6 cm acute ischemic nonhemorrhagic right basal ganglia infarct. 2. 1.1 cm acute to early subacute ischemic nonhemorrhagic left basal ganglia infarct. 3. Additional punctate acute to early subacute ischemic nonhemorrhagic right thalamic infarcts. 4. Underlying age-related cerebral atrophy with advanced chronic microvascular ischemic disease, with a few remote lacunar infarcts involving the bilateral basal ganglia, pons, and right cerebellum. Electronically Signed   By: Rise Mu M.D.   On: 10/11/2023 07:21   DG Chest 2 View  Result Date: 10/08/2023 CLINICAL DATA:  Altered mental status. EXAM: CHEST - 2 VIEW COMPARISON:  12/30/2022 FINDINGS: The cardio pericardial silhouette is enlarged. There is pulmonary vascular congestion. Mild interstitial pulmonary edema not excluded. No focal consolidation or substantial pleural effusion. No acute bony abnormality. IMPRESSION: Enlargement of the cardiopericardial silhouette with pulmonary vascular congestion. Mild interstitial pulmonary edema not excluded. Electronically Signed   By: Kennith Center M.D.   On: 10/08/2023 12:51   CT Head Wo Contrast  Result Date: 10/08/2023 CLINICAL DATA:  87 year old female with altered mental status, decreased p.o. intake. EXAM: CT HEAD WITHOUT CONTRAST TECHNIQUE: Contiguous axial images were obtained from the base of the skull through the vertex  without intravenous contrast. RADIATION DOSE REDUCTION: This exam was performed according to the departmental dose-optimization program which includes automated exposure control, adjustment of the mA and/or kV according to patient size and/or use of iterative reconstruction technique. COMPARISON:  Head CT 09/09/2023 and earlier. FINDINGS: Brain: Stable cerebral volume. No midline shift, ventriculomegaly, mass effect, evidence of mass lesion, intracranial hemorrhage or evidence of cortically based acute infarction. Confluent bilateral cerebral white matter hypodensity, chronic cortical encephalomalacia in the left parietal lobe. Chronic right caudate and smaller thalamic lacunar infarcts. Stable gray-white matter differentiation throughout the brain. Vascular: Calcified atherosclerosis at the skull base. No suspicious intracranial vascular hyperdensity. Calcified basal ganglia vascular calcifications. Skull: Intact, negative. Sinuses/Orbits: Visualized paranasal sinuses and mastoids are stable and well aerated. Other: No acute orbit or scalp soft tissue finding. IMPRESSION: 1. No acute intracranial abnormality. 2. Stable non contrast CT appearance of advanced chronic ischemic disease. Electronically Signed   By: Odessa Fleming M.D.   On: 10/08/2023 12:19     PHYSICAL EXAM  Temp:  [97.7 F (36.5 C)-98.4 F (36.9 C)] 98.1 F (36.7 C) (11/13 0505) Pulse Rate:  [73-79] 78 (11/13 1124) Resp:  [16-17] 17 (11/13 0505) BP: (128-200)/(67-98) 147/67 (11/13 1124) SpO2:  [100 %] 100 % (11/13 0505)  General - Well nourished, well developed, in no apparent distress.  Ophthalmologic - fundi not visualized due to noncooperation.  Cardiovascular - Regular rhythm and rate.  Neuro - awake, alert, eyes open, orientated to people only, not orientated to place, time. Paucity of speech, able to repeat simple sentences but not cooperative on naming, able to follow most simple commands. Right gaze preference but able to cross  midline with intermittent left gaze, intermittently blinking to visual threat on the left, but more consistent on the right. Mild left facial droop. Tongue protrusion not cooperative. Bilateral UEs 4/5, no drift, but with left UE neglect. Bilaterally LEs 3/5. Sensation, coordination not cooperative and gait not tested.   ASSESSMENT/PLAN Ms. Marriah Shartle is a 87 y.o. female with history of dementia, hypertension, hyperlipidemia, diabetes, CAD, CKD 3, anxiety, stroke admitted for altered mental status, slurred speech, generalized weakness and decreased appetite. No tPA given due to also window.    Stroke: Bilateral BG/CR and right thalamic infarcts, still more likely secondary to small vessel disease source, cannot completely rule out cardiac embolic source CT no acute abnormality MRI bilateral BG/CR and right thalamic infarcts.  Old bilateral BG, right cerebellum and pontine infarct. MRA severe mid A1 stenosis Carotid Doppler unremarkable 2D Echo EF 55 to 60% May consider 30-day CardioNet monitor as outpatient to rule out A-fib LDL 164 HgbA1c 6.1 Lovenox for VTE prophylaxis aspirin 81 mg daily and clopidogrel 75 mg daily prior to admission but not taking medication for at least last 2 weeks, now on aspirin 81 mg daily and clopidogrel 75 mg daily DAPT for 3 weeks and then aspirin alone Ongoing aggressive stroke risk factor management Therapy recommendations: SNF Disposition: Pending  History of stroke/TIA 09/2017 admitted for TIA, MRA head and neck negative, LDL 96, A1c 7.9.  Discharged on aspirin and Crestor 2.5 08/2022 admitted for syncope, MRI negative 03/2023 MRI showed right caudate head and right MCA infarcts.  MRA showed severe left SCA stenosis.  Carotid Doppler negative.  EF 60 to 65%.  LDL 163, A1c 7.7.  Discharged on DAPT and Crestor 20  Diabetes HgbA1c 6.1 goal < 7.0 Controlled CBG monitoring SSI DM education and close PCP follow up  Hypertension Stable on the high  and On Coreg gradually normalize BP within 5-7 days. Long term BP goal normotensive  Hyperlipidemia Home meds: None LDL 164, goal < 70 Now on Lipitor 40 Continue statin at discharge  Other Stroke Risk Factors Advanced age CAD Medication noncompliance  Other Active Problems CKD 3A, creatinine 1.59--1.37--1.46 Dementia, on Zoloft and Abilify  Hospital day # 2  Neurology will sign off. Please call with questions. Pt will follow up with Dr. Loleta Chance at [facility] in about 4 weeks. Thanks for the consult.   Marvel Plan, MD PhD Stroke Neurology 10/12/2023 11:33 AM    To contact Stroke Continuity provider, please refer to [email or web address]. After hours, contact General Neurology

## 2023-10-12 NOTE — Progress Notes (Signed)
Occupational Therapy Treatment Patient Details Name: Diana Harper MRN: [id number] DOB: 08/21/1936 Today's Date: 10/12/2023   History of present illness Patient is a 87 year old female who was admitted with AMS, slurred speech, decreased appetite and weakness.patient was admitted with acute metabolic encephalopathy, dehydration, hypertensive urgency,  CT head: negative for acute changes; MRI + for B basal ganglia CVA with additional pons and R cerebellar lacunar infarcts.  PMH: dementia, CKD III, asthma, hyperlipidemia, HTN, CAD, depression.   OT comments  Pt alert with supportive daughter at bedside encouraging pt. Max assist for supine to sit, total for sit to supine. Pt sat EOB, initially with max assist, progressed to min assist with pt's R hand on footboard and later on therapist's knees sitting in front of pt. Pt with L inattention and c/o neck soreness as she tends to keep her head turned R. Educated daughter to stimulate pt from L side. Pt verbalizing, but speech not consistently intelligible as it is typically, per daughter. Added hospital bed to DME recommendations. Family has a Nurse, adult and w/c. Patient will benefit from continued inpatient follow up therapy, <3 hours/day.      If plan is discharge home, recommend the following:  Two people to help with walking and/or transfers;Two people to help with bathing/dressing/bathroom;Assistance with cooking/housework;Direct supervision/assist for medications management;Assist for transportation;Help with stairs or ramp for entrance;Direct supervision/assist for financial management   Equipment Recommendations  Hospital bed    Recommendations for Other Services      Precautions / Restrictions Precautions Precautions: Fall Restrictions Weight Bearing Restrictions: No       Mobility Bed Mobility Overal bed mobility: Needs Assistance Bed Mobility: Supine to Sit, Sit to Supine     Supine to sit: Max assist, HOB elevated Sit to  supine: Total assist   General bed mobility comments: pt assisting by raising her trunk, assist for LEs over EOB and hips to EOB with bed pad    Transfers                   General transfer comment: deferred, pt fatigued     Balance Overall balance assessment: Needs assistance   Sitting balance-Leahy Scale: Poor Sitting balance - Comments: L lean, holds foot board                                   ADL either performed or assessed with clinical judgement   ADL Overall ADL's : Needs assistance/impaired Eating/Feeding: Moderate assistance;Bed level Eating/Feeding Details (indicate cue type and reason): to drink from cup Grooming: Wash/dry face;Bed level;Total assistance                                      Extremity/Trunk Assessment Upper Extremity Assessment Upper Extremity Assessment: Right hand dominant;RUE deficits/detail;LUE deficits/detail RUE Deficits / Details: reflexive grasp, able to bring cup to mouth to drink from straw when place in her hand, hold footboard in sitting RUE Coordination: decreased fine motor;decreased gross motor LUE Deficits / Details: reflexive grasp            Vision       Perception Perception Perception: Impaired Preception Impairment Details: Inattention/Neglect Perception-Other Comments: L inattention, educated daughter in approaching pt to her L side   Praxis      Cognition Arousal: Alert Behavior During Therapy: Flat affect (smiles a  times) Overall Cognitive Status: Impaired/Different from baseline Area of Impairment: Following commands, Attention, Memory                   Current Attention Level: Focused Memory: Decreased short-term memory Following Commands: Follows one step commands with increased time (and multimodal cues)       General Comments: pt with h/o dementia        Exercises      Shoulder Instructions       General Comments      Pertinent Vitals/  Pain       Pain Assessment Pain Assessment: Faces Faces Pain Scale: Harper a little bit Pain Location: neck Pain Descriptors / Indicators: Grimacing, Discomfort Pain Intervention(s): Repositioned  Home Living                                          Prior Functioning/Environment              Frequency  Min 1X/week        Progress Toward Goals  OT Goals(current goals can now be found in the care plan section)  Progress towards OT goals: Progressing toward goals  Acute Rehab OT Goals OT Goal Formulation: Patient unable to participate in goal setting Time For Goal Achievement: 10/23/23 Potential to Achieve Goals: Fair  Plan      Co-evaluation                 AM-PAC OT "6 Clicks" Daily Activity     Outcome Measure   Help from another person eating meals?: A Lot Help from another person taking care of personal grooming?: Total Help from another person toileting, which includes using toliet, bedpan, or urinal?: Total Help from another person bathing (including washing, rinsing, drying)?: Total Help from another person to put on and taking off regular upper body clothing?: Total Help from another person to put on and taking off regular lower body clothing?: Total 6 Click Score: 7    End of Session    OT Visit Diagnosis: Muscle weakness (generalized) (M62.81);Other symptoms and signs involving cognitive function;Unsteadiness on feet (R26.81)   Activity Tolerance Patient tolerated treatment well   Patient Left in bed;with call bell/phone within reach;with bed alarm set;with family/visitor present   Nurse Communication          Time: 8657-8469 OT Time Calculation (min): 27 min  Charges: OT General Charges $OT Visit: 1 Visit OT Treatments $Self Care/Home Management : 8-22 mins $Therapeutic Activity: 8-22 mins  Berna Spare, OTR/L Acute Rehabilitation Services Office: [phone]   Evern Bio 10/12/2023, 12:12 PM

## 2023-10-12 NOTE — Progress Notes (Signed)
PROGRESS NOTE    Diana Harper  ZOX:[id number] DOB: 1936/03/21 DOA: 10/08/2023 PCP: Lewis Moccasin, MD   Brief Narrative:  Diana Harper is a 87 y.o. female with medical history significant of multiple medical problems including CKD stage IIIa, asthma, hyperlipidemia, hypertension, CAD was brought into the emergency department with decreased appetite, weakness, altered mental status and slurred speech since 2 to 3 days.  History was mostly obtained from the daughter who is at the bedside.  Patient has dementia and typically is only oriented to herself, her date of birth and family members around her.  Reportedly, since Wednesday, patient has been moving less often, has been speaking less often, has been eating less often as well.  She has been noted to have some more confusion than her baseline based on the dementia.    Found to have CVAs  Assessment & Plan:   Principal Problem:   AMS (altered mental status) Active Problems:   Acute renal failure superimposed on stage 3a chronic kidney disease (HCC)   Hypothyroidism   Uncontrolled type 2 diabetes mellitus with hyperglycemia, without long-term current use of insulin (HCC)   Hypokalemia   Acute ischemic stroke (HCC)   Hypertensive urgency   Dementia without behavioral disturbance (HCC)  Acute metabolic encephalopathy/generalized weakness/multiple acute strokes, likely embolic:  -CT head negative at the time of admission.  Patient became more alert and speech was more fluent next day of hospitalization but she still appeared dehydrated so IV fluids were continued.  However on the morning of 10/10/2023, patient appeared to have word finding difficulty.  This raised suspicion for stroke.  MRI done with embolic CVAs.   -neurology consulted-- await final recommendations -echo pending  Hyperlipidemia: -statin  Dehydration/acute on chronic kidney disease stage IIIb:  -s/p IVF with resolution    Hypertensive urgency:  -Per pharmacy  tech, patient was not taking Coreg so that was discontinued.   -Due to elevated blood pressure she was started on Coreg 6.25 mg p.o. twice daily, 48 hours after presentation to the ED.  Gradually lower blood pressure now.  Dysphagia: SLP on board.  - dysphagia 1 diet.  Oral candidiasis:  -Nystatin swish and swallow.   Hypokalemia/hypomagnesemia: -replete  Acquired hypothyroidism: Continue Synthroid.   Asthma, mild intermittent: Completely asymptomatic.  Resume albuterol inhaler.   History of CAD: Continue Plavix.  She is asymptomatic.   History of depression: Resume Zoloft.   Type 2 diabetes mellitus: Recent hemoglobin A1c in chart was 7.7 and 6.1 this hospitalization.  She does not appear to be on medications for this.  Continue SSI.   DVT prophylaxis: enoxaparin (LOVENOX) injection 30 mg Start: 10/08/23 1800   Code Status: Full Code    Status is: Inpatient Remains inpatient appropriate because: CVA work up   Consultants:  neurology     Subjective: Remains confused  Objective: Vitals:   10/12/23 0121 10/12/23 0505 10/12/23 0640 10/12/23 1124  BP: (!) 161/72 (!) 189/80 (!) 154/96 (!) 147/67  Pulse:  78 75 78  Resp:  17    Temp:  98.1 F (36.7 C)    TempSrc:  Oral    SpO2:  100%    Weight:      Height:        Intake/Output Summary (Last 24 hours) at 10/12/2023 1206 Last data filed at 10/12/2023 0446 Gross per 24 hour  Intake 248.27 ml  Output --  Net 248.27 ml   Filed Weights   10/08/23 1330  Weight: 62 kg  Examination:  General exam: Appears calm and comfortable  Respiratory system: Clear to auscultation. Respiratory effort normal. Cardiovascular system: rrr Central nervous system: Alert and oriented x 1. (Person, not place or time)     Data Reviewed: I have personally reviewed following labs and imaging studies  CBC: Recent Labs  Lab 10/08/23 1328 10/08/23 1900 10/10/23 0903  WBC 6.7 6.3 6.0  NEUTROABS 4.0  --  3.8  HGB 11.7*  10.4* 11.2*  HCT 34.0* 31.3* 33.0*  MCV 85.9 87.2 85.7  PLT 219 215 226   Basic Metabolic Panel: Recent Labs  Lab 10/08/23 1328 10/08/23 1900 10/09/23 1310 10/10/23 0903 10/11/23 0518  NA 141  --  141 141 140  K 3.3*  --  3.7 2.9* 3.7  CL 105  --  109 109 111  CO2 27  --  23 22 19*  GLUCOSE 121*  --  107* 107* 126*  BUN 25*  --  23 19 23   CREATININE 1.89* 1.81* 1.59* 1.37* 1.46*  CALCIUM 9.3  --  8.8* 8.6* 9.0  MG  --   --   --  1.6* 2.5*   GFR: Estimated Creatinine Clearance: 26.9 mL/min (A) (by C-G formula based on SCr of 1.46 mg/dL (H)). Liver Function Tests: Recent Labs  Lab 10/08/23 1328  AST 17  ALT 16  ALKPHOS 59  BILITOT 1.0  PROT 7.0  ALBUMIN 3.6   No results for input(s): "LIPASE", "AMYLASE" in the last 168 hours. Recent Labs  Lab 10/08/23 1328  AMMONIA 16   Coagulation Profile: No results for input(s): "INR", "PROTIME" in the last 168 hours. Cardiac Enzymes: No results for input(s): "CKTOTAL", "CKMB", "CKMBINDEX", "TROPONINI" in the last 168 hours. BNP (last 3 results) No results for input(s): "PROBNP" in the last 8760 hours. HbA1C: No results for input(s): "HGBA1C" in the last 72 hours.  CBG: Recent Labs  Lab 10/11/23 0737 10/11/23 1125 10/11/23 1737 10/11/23 2128 10/12/23 0757  GLUCAP 130* 150* 117* 114* 78   Lipid Profile: No results for input(s): "CHOL", "HDL", "LDLCALC", "TRIG", "CHOLHDL", "LDLDIRECT" in the last 72 hours.  Thyroid Function Tests: No results for input(s): "TSH", "T4TOTAL", "FREET4", "T3FREE", "THYROIDAB" in the last 72 hours.  Anemia Panel: No results for input(s): "VITAMINB12", "FOLATE", "FERRITIN", "TIBC", "IRON", "RETICCTPCT" in the last 72 hours. Sepsis Labs: No results for input(s): "PROCALCITON", "LATICACIDVEN" in the last 168 hours.  No results found for this or any previous visit (from the past 240 hour(s)).   Radiology Studies: MR ANGIO HEAD WO CONTRAST  Result Date: 10/11/2023 CLINICAL DATA:   Follow-up examination for stroke. EXAM: MRA HEAD WITHOUT CONTRAST TECHNIQUE: Angiographic images of the Circle of Willis were acquired using MRA technique without intravenous contrast. COMPARISON:  Prior MRI from 10/10/2023. FINDINGS: Anterior circulation: Examination mildly degraded by motion artifact. Both internal carotid arteries are patent through the siphon to the termini without significant stenosis or other abnormality. Left A1 segment dominant and patent. Focal severe stenosis at the mid right A1 segment (series 352, image 1). Normal anterior communicating artery complex. Partially visualized anterior cerebral arteries grossly patent without visible stenosis. No M1 stenosis or occlusion. No visible proximal MCA branch occlusion or high-grade stenosis. Distal MCA branches grossly perfused and symmetric. Posterior circulation: Both vertebral arteries patent to the vertebrobasilar junction without stenosis. Left vertebral artery dominant. Both PICA grossly patent at their origins. Basilar patent without stenosis. Superior cerebral arteries patent bilaterally. Both PCAs supplied via the basilar. Visualized PCAs are grossly patent to their distal  aspects without visible stenosis. Anatomic variants: As above. Other: No intracranial aneurysm. IMPRESSION: 1. Negative intracranial MRA for large vessel occlusion. 2. Focal severe stenosis at the mid right A1 segment. 3. Otherwise negative intracranial MRA. Electronically Signed   By: Rise Mu M.D.   On: 10/11/2023 20:53   MR BRAIN WO CONTRAST  Result Date: 10/11/2023 CLINICAL DATA:  Initial evaluation for neuro deficit, stroke suspected. EXAM: MRI HEAD WITHOUT CONTRAST TECHNIQUE: Multiplanar, multiecho pulse sequences of the brain and surrounding structures were obtained without intravenous contrast. COMPARISON:  Prior CT from 10/08/2023 and MRI from 04/18/2023. FINDINGS: Brain: Generalized age-related cerebral atrophy. Extensive T2/FLAIR  hyperintensity involving the periventricular and deep white matter of both cerebral hemispheres, consistent with advanced chronic microvascular ischemic disease. Patchy FLAIR signal abnormality involving the bilateral parieto-occipital regions noted, stable from prior MRI, and presumably related to chronic microvascular ischemic changes as well. Few remote lacunar infarcts present about the bilateral basal ganglia, pons, and right cerebellum. 1.6 cm acute ischemic infarct present at the right basal ganglia (series 5, image 31). Additional 1.1 cm acute to early subacute ischemic infarct present at the contralateral left basal ganglia (series 5, image 29). Few punctate foci of diffusion signal abnormality within the right thalamus, also consistent with acute to early subacute ischemic changes (series 5, image 26). No associated hemorrhage or mass effect. No other evidence for acute or subacute ischemia. No acute intracranial hemorrhage. Multiple scattered chronic micro hemorrhages noted, most pronounced at the left aspect of the splenium, likely hypertensive in nature. No mass lesion, midline shift or mass effect. No hydrocephalus or extra-axial fluid collection. Pituitary gland suprasellar region within normal limits. Vascular: Major intracranial vascular flow voids are maintained. Skull and upper cervical spine: Craniocervical junction within normal limits. Moderate to advanced multilevel spondylosis noted within the upper cervical spine, most pronounced at C3-4 where there is mild-to-moderate spinal stenosis. Bone marrow signal intensity within normal limits. No scalp soft tissue abnormality. Sinuses/Orbits: Prior bilateral ocular lens replacement. Paranasal sinuses are largely clear. No significant mastoid effusion. Other: None. IMPRESSION: 1. 1.6 cm acute ischemic nonhemorrhagic right basal ganglia infarct. 2. 1.1 cm acute to early subacute ischemic nonhemorrhagic left basal ganglia infarct. 3. Additional  punctate acute to early subacute ischemic nonhemorrhagic right thalamic infarcts. 4. Underlying age-related cerebral atrophy with advanced chronic microvascular ischemic disease, with a few remote lacunar infarcts involving the bilateral basal ganglia, pons, and right cerebellum. Electronically Signed   By: Rise Mu M.D.   On: 10/11/2023 07:21    Scheduled Meds:  ARIPiprazole  2 mg Oral Daily   aspirin EC  81 mg Oral Daily   atorvastatin  40 mg Oral Daily   carvedilol  6.25 mg Oral BID   cholecalciferol  5,000 Units Oral Daily   clopidogrel  75 mg Oral Daily   enoxaparin (LOVENOX) injection  30 mg Subcutaneous Q24H   insulin aspart  0-15 Units Subcutaneous TID WC   insulin aspart  0-5 Units Subcutaneous QHS   levothyroxine  88 mcg Oral QAC breakfast   nystatin  5 mL Oral QID   sertraline  150 mg Oral QHS   Continuous Infusions:     LOS: 2 days   Joseph Art, DO [facility]  10/12/2023, 12:06 PM    How to contact the [facility] Attending or Consulting provider 7A - 7P or covering provider during after hours 7P -7A, for this patient?  Check the care team in [facility] and look for a) attending/consulting [facility] provider listed  and b) the [facility] team listed. Page or secure chat 7A-7P. Log into [email or web address] and use [facility] universal password to access. If you do not have the password, please contact the hospital operator. Locate the [facility] provider you are looking for under [facility] and page to a number that you can be directly reached. If you still have difficulty reaching the provider, please page the [facility] (Director on Call) for the Hospitalists listed on amion for assistance.

## 2023-10-12 NOTE — TOC Initial Note (Signed)
Transition of Care [facility]) - Initial/Assessment Note    Patient Details  Name: Diana Harper MRN: [id number] Date of Birth: Jun 23, 1936  Transition of Care [facility]) CM/SW Contact:    Kingsley Plan, RN Phone Number: 10/12/2023, 4:02 PM  Clinical Narrative:                  Spoke to patient's daughter [name] . [name] wants to take her mother home at discharge.   [name] states she is active with [facility] for palliative care and is getting  aides through [facility] a nurse has to do a home assessment.   She has had home health RN,PT,OT in the past through [facility] and would like those services again.   [name] reports PCP and [facility] ordered a hospital bed 2 weeks ago but they have not receive hospital bed as of yet. She would like hospital bed prior to her mother going home. NCM explained NCM can get order for hospital bed and order through [employer]. Daughter in agreement. Daughter confirmd address on face sheet   NCM discussed transportation home at discharge offered ambulance, however [name] believes her mother can go by car.   [name] is the contact person for [facility] , [facility], and [employer] .    NCM called Misty with [facility] to notify her of admission.   Lynette with [facility] accepted referral.   Ordered hospital bed with Mitch with [facility].   MD provided orders.  Expected Discharge Plan: Home w Home Health Services Barriers to Discharge: Continued Medical Work up   Patient Goals and CMS Choice Patient states their goals for this hospitalization and ongoing recovery are:: "age in place" CMS Medicare.gov Compare Post Acute Care list provided to:: Patient Represenative (must comment) (daughter [name]) Choice offered to / list presented to : Adult Children      Expected Discharge Plan and Services In-house Referral: Clinical Social Work Discharge Planning Services: CM Consult Post Acute Care Choice: Durable Medical Equipment, Home Health Living  arrangements for the past 2 months: Single Family Home                 DME Arranged: Hospital bed DME Agency: [facility] Date DME Agency Contacted: 10/12/23 Time DME Agency Contacted: 39 Representative spoke with at DME Agency: Mitch HH Arranged: RN, PT, OT HH Agency: [facility] Date [facility] Agency Contacted: 10/12/23 Time HH Agency Contacted: 1601 Representative spoke with at [facility] Agency: Haywood Lasso  Prior Living Arrangements/Services Living arrangements for the past 2 months: Single Family Home Lives with:: Adult Children Patient language and need for interpreter reviewed:: Yes Do you feel safe going back to the place where you live?: Yes      Need for Family Participation in Patient Care: Yes (Comment) Care giver support system in place?: Yes (comment) Current home services: DME Criminal Activity/Legal Involvement Pertinent to Current Situation/Hospitalization: No - Comment as needed  Activities of Daily Living   ADL Screening (condition at time of admission) Independently performs ADLs?: No Does the patient have a NEW difficulty with bathing/dressing/toileting/self-feeding that is expected to last >3 days?: Yes (Initiates electronic notice to provider for possible OT consult) Does the patient have a NEW difficulty with getting in/out of bed, walking, or climbing stairs that is expected to last >3 days?: Yes (Initiates electronic notice to provider for possible PT consult) Does the patient have a NEW difficulty with communication that is expected to last >3 days?: Yes (Initiates electronic notice to provider for possible SLP consult) Is the patient  deaf or have difficulty hearing?: No Does the patient have difficulty seeing, even when wearing glasses/contacts?: No Does the patient have difficulty concentrating, remembering, or making decisions?: Yes  Permission Sought/Granted   Permission granted to share information with : Yes, Verbal Permission Granted  Share Information with  NAME: daughhter, [name]  Permission granted to share info w AGENCY: [facility] and [facility]        Emotional Assessment Appearance:: Appears older than stated age Attitude/Demeanor/Rapport: Unable to Assess Affect (typically observed): Unable to Assess Orientation: : Oriented to Self Alcohol / Substance Use: Not Applicable Psych Involvement: No (comment)  Admission diagnosis:  Decreased appetite [R63.0] Altered mental status, unspecified altered mental status type [R41.82] AMS (altered mental status) [R41.82] Patient Active Problem List   Diagnosis Date Noted   Hypertensive urgency 10/08/2023   Dementia without behavioral disturbance (HCC) 10/08/2023   Acute ischemic stroke (HCC) 04/18/2023   Myelopathy (HCC) 01/09/2023   Acute metabolic encephalopathy 09/15/2022   Vitamin B12 deficiency 09/15/2022   Thiamine deficiency 09/15/2022   Back pain 09/15/2022   CKD (chronic kidney disease), stage IV (HCC) 09/15/2022   Hypokalemia 09/15/2022   AMS (altered mental status) 09/12/2022   C. difficile colitis 07/25/2022   Normocytic anemia 07/24/2022   Uncontrolled type 2 diabetes mellitus with hyperglycemia, without long-term current use of insulin (HCC) 07/17/2022   Acute diverticulitis 04/06/2021   CKD stage 3 due to type 2 diabetes mellitus (HCC) 04/06/2021   Essential hypertension 04/06/2021   Hypothyroidism    Depression    COPD (chronic obstructive pulmonary disease) (HCC)    Anorexia 02/11/2021   Bloating symptom 02/11/2021   History of colon polyps 02/09/2021   Hemorrhoids 02/09/2021   Rectal bleeding 02/09/2021   Fecal smearing 02/09/2021   Acute renal failure superimposed on stage 3a chronic kidney disease (HCC) 12/13/2019   Acute kidney injury (HCC) 12/12/2019   Diarrhea 12/12/2019   Dehydration 12/12/2019   Cerebral thrombosis with cerebral infarction 10/10/2017   Cerebral embolism with cerebral infarction 10/10/2017   Subarachnoid hemorrhage 10/10/2017    Intracerebral hemorrhage 10/10/2017   CVA (cerebral vascular accident) (HCC) 10/10/2017   Chronic cholecystitis with calculus 08/25/2016   Syncope 11/15/2011   Diverticulitis 11/15/2011   PCP:  Lewis Moccasin, MD Pharmacy:   [facility] [address] [place] - [address] [place] [address] Phone: [phone] Fax: [phone]  [facility] [address] [place] [address] Phone: [phone] Fax: [phone]  [facility] [address] [place] [address] Phone: [phone] Fax: [phone]     Social Determinants of Health (SDOH) Social History: SDOH Screenings   Food Insecurity: No Food Insecurity (10/08/2023)  Housing: Low Risk  (10/08/2023)  Transportation Needs: No Transportation Needs (10/08/2023)  Utilities: Not At Risk (10/08/2023)  Social Connections: Unknown (03/28/2022)   Received from [facility]  Tobacco Use: Low Risk  (10/08/2023)   SDOH Interventions:     Readmission Risk Interventions    07/27/2022   10:46 AM  Readmission Risk Prevention Plan  Transportation Screening Complete  PCP or Specialist Appt within 3-5 Days Complete  HRI or Home Care Consult Complete  Social Work Consult for Recovery Care Planning/Counseling Complete  Palliative Care Screening Not Applicable  Medication Review Oceanographer) Complete

## 2023-10-12 NOTE — TOC Initial Note (Signed)
Transition of Care [facility]) - Initial/Assessment Note    Patient Details  Name: Diana Harper MRN: [id number] Date of Birth: 11/20/1936  Transition of Care [facility]) CM/SW Contact:    Seanne Chirico A [place], Theresia Majors Phone Number: 10/12/2023, 3:19 PM  Clinical Narrative:                  CSW contacted pt's daughter [name] to complete assessment as pt is only oriented to person. She said that her mother is "aging in place" and that she has personal care and home health aid at home. She said that the family has been working with [facility] in [place] and that [place], her nurse has been assisting with services for family, [phone]. She said that pt has all equipment at home, except for hospital bed, which pt's primary care provider and [facility] have been working on before pt's hospital admission. CSW notified nursing case management to assist and provided information for [facility] palliative and other home needs.   TOC will continue to follow.  Expected Discharge Plan: Home w Home Health Services Barriers to Discharge: Continued Medical Work up   Patient Goals and CMS Choice Patient states their goals for this hospitalization and ongoing recovery are:: "age in place"          Expected Discharge Plan and Services In-house Referral: Clinical Social Work     Living arrangements for the past 2 months: Single Family Home                                      Prior Living Arrangements/Services Living arrangements for the past 2 months: Single Family Home Lives with:: Adult Children          Need for Family Participation in Patient Care: Yes (Comment) Care giver support system in place?: Yes (comment) Current home services: Other (comment), Homehealth aide [facility] and personal care services)    Activities of Daily Living   ADL Screening (condition at time of admission) Independently performs ADLs?: No Does the patient have a NEW difficulty with  bathing/dressing/toileting/self-feeding that is expected to last >3 days?: Yes (Initiates electronic notice to provider for possible OT consult) Does the patient have a NEW difficulty with getting in/out of bed, walking, or climbing stairs that is expected to last >3 days?: Yes (Initiates electronic notice to provider for possible PT consult) Does the patient have a NEW difficulty with communication that is expected to last >3 days?: Yes (Initiates electronic notice to provider for possible SLP consult) Is the patient deaf or have difficulty hearing?: No Does the patient have difficulty seeing, even when wearing glasses/contacts?: No Does the patient have difficulty concentrating, remembering, or making decisions?: Yes  Permission Sought/Granted                  Emotional Assessment Appearance:: Appears older than stated age Attitude/Demeanor/Rapport: Unable to Assess Affect (typically observed): Unable to Assess Orientation: : Oriented to Self Alcohol / Substance Use: Not Applicable Psych Involvement: No (comment)  Admission diagnosis:  Decreased appetite [R63.0] Altered mental status, unspecified altered mental status type [R41.82] AMS (altered mental status) [R41.82] Patient Active Problem List   Diagnosis Date Noted   Hypertensive urgency 10/08/2023   Dementia without behavioral disturbance (HCC) 10/08/2023   Acute ischemic stroke (HCC) 04/18/2023   Myelopathy (HCC) 01/09/2023   Acute metabolic encephalopathy 09/15/2022   Vitamin B12 deficiency 09/15/2022   Thiamine  deficiency 09/15/2022   Back pain 09/15/2022   CKD (chronic kidney disease), stage IV (HCC) 09/15/2022   Hypokalemia 09/15/2022   AMS (altered mental status) 09/12/2022   C. difficile colitis 07/25/2022   Normocytic anemia 07/24/2022   Uncontrolled type 2 diabetes mellitus with hyperglycemia, without long-term current use of insulin (HCC) 07/17/2022   Acute diverticulitis 04/06/2021   CKD stage 3 due to type  2 diabetes mellitus (HCC) 04/06/2021   Essential hypertension 04/06/2021   Hypothyroidism    Depression    COPD (chronic obstructive pulmonary disease) (HCC)    Anorexia 02/11/2021   Bloating symptom 02/11/2021   History of colon polyps 02/09/2021   Hemorrhoids 02/09/2021   Rectal bleeding 02/09/2021   Fecal smearing 02/09/2021   Acute renal failure superimposed on stage 3a chronic kidney disease (HCC) 12/13/2019   Acute kidney injury (HCC) 12/12/2019   Diarrhea 12/12/2019   Dehydration 12/12/2019   Cerebral thrombosis with cerebral infarction 10/10/2017   Cerebral embolism with cerebral infarction 10/10/2017   Subarachnoid hemorrhage 10/10/2017   Intracerebral hemorrhage 10/10/2017   CVA (cerebral vascular accident) (HCC) 10/10/2017   Chronic cholecystitis with calculus 08/25/2016   Syncope 11/15/2011   Diverticulitis 11/15/2011   PCP:  Lewis Moccasin, MD Pharmacy:   [facility] [address] [place] - [address] [place] [address] Phone: [phone] Fax: [phone]  [facility] [address] [place] [address] Phone: [phone] Fax: [phone]  [facility] [address] [place] [address] Phone: [phone] Fax: [phone]     Social Determinants of Health (SDOH) Social History: SDOH Screenings   Food Insecurity: No Food Insecurity (10/08/2023)  Housing: Low Risk  (10/08/2023)  Transportation Needs: No Transportation Needs (10/08/2023)  Utilities: Not At Risk (10/08/2023)  Social Connections: Unknown (03/28/2022)   [facility]  Tobacco Use: Low Risk  (10/08/2023)   SDOH Interventions:     Readmission Risk Interventions    07/27/2022   10:46 AM  Readmission Risk Prevention Plan  Transportation Screening Complete  PCP or Specialist Appt within 3-5 Days Complete  HRI or Home Care Consult  Complete  Social Work Consult for Recovery Care Planning/Counseling Complete  Palliative Care Screening Not Applicable  Medication Review Oceanographer) Complete

## 2023-10-13 DIAGNOSIS — F0390 Unspecified dementia without behavioral disturbance: Secondary | ICD-10-CM

## 2023-10-13 DIAGNOSIS — I6310 Cerebral infarction due to embolism of unspecified precerebral artery: Secondary | ICD-10-CM | POA: Diagnosis not present

## 2023-10-13 LAB — CBC
HCT: 31.9 % — ABNORMAL LOW (ref 36.0–46.0)
Hemoglobin: 10.7 g/dL — ABNORMAL LOW (ref 12.0–15.0)
MCH: 28.2 pg (ref 26.0–34.0)
MCHC: 33.5 g/dL (ref 30.0–36.0)
MCV: 84.2 fL (ref 80.0–100.0)
Platelets: 248 10*3/uL (ref 150–400)
RBC: 3.79 MIL/uL — ABNORMAL LOW (ref 3.87–5.11)
RDW: 13.7 % (ref 11.5–15.5)
WBC: 6.4 10*3/uL (ref 4.0–10.5)
nRBC: 0 % (ref 0.0–0.2)

## 2023-10-13 LAB — BASIC METABOLIC PANEL
Anion gap: 8 (ref 5–15)
BUN: 26 mg/dL — ABNORMAL HIGH (ref 8–23)
CO2: 23 mmol/L (ref 22–32)
Calcium: 9.4 mg/dL (ref 8.9–10.3)
Chloride: 110 mmol/L (ref 98–111)
Creatinine, Ser: 1.99 mg/dL — ABNORMAL HIGH (ref 0.44–1.00)
GFR, Estimated: 24 mL/min — ABNORMAL LOW (ref >60)
Glucose, Bld: 123 mg/dL — ABNORMAL HIGH (ref 70–99)
Potassium: 3.7 mmol/L (ref 3.5–5.1)
Sodium: 141 mmol/L (ref 135–145)

## 2023-10-13 LAB — GLUCOSE, CAPILLARY
Glucose-Capillary: 148 mg/dL — ABNORMAL HIGH (ref 70–99)
Glucose-Capillary: 114 mg/dL — ABNORMAL HIGH (ref 70–99)
Glucose-Capillary: 107 mg/dL — ABNORMAL HIGH (ref 70–99)
Glucose-Capillary: 190 mg/dL — ABNORMAL HIGH (ref 70–99)

## 2023-10-13 MED ORDER — SODIUM CHLORIDE 0.9 % IV SOLN: INTRAVENOUS | Status: DC

## 2023-10-13 NOTE — Progress Notes (Signed)
PROGRESS NOTE    Diana Harper  ZOX:[id number] DOB: October 02, 1936 DOA: 10/08/2023 PCP: Lewis Moccasin, MD   Brief Narrative:  Diana Harper is a 87 y.o. female with medical history significant of multiple medical problems including CKD stage IIIa, asthma, hyperlipidemia, hypertension, CAD was brought into the emergency department with decreased appetite, weakness, altered mental status and slurred speech since 2 to 3 days.  History was mostly obtained from the daughter who is at the bedside.  Patient has dementia and typically is only oriented to herself, her date of birth and family members around her.  Reportedly, since Wednesday, patient has been moving less often, has been speaking less often, has been eating less often as well.  She has been noted to have some more confusion than her baseline based on the dementia.    Found to have CVAs  Assessment & Plan:   Principal Problem:   AMS (altered mental status) Active Problems:   Acute renal failure superimposed on stage 3a chronic kidney disease (HCC)   Hypothyroidism   Uncontrolled type 2 diabetes mellitus with hyperglycemia, without long-term current use of insulin (HCC)   Hypokalemia   Acute ischemic stroke (HCC)   Hypertensive urgency   Dementia without behavioral disturbance (HCC)  Acute metabolic encephalopathy/generalized weakness/multiple acute strokes, likely embolic:  -CT head negative at the time of admission.  Patient became more alert and speech was more fluent next day of hospitalization but she still appeared dehydrated so IV fluids were continued.  However on the morning of 10/10/2023, patient appeared to have word finding difficulty.  This raised suspicion for stroke.  MRI done with embolic CVAs.   -neurology consulted-- DAPT for 3 weeks and then aspirin alone  -echo done  Hyperlipidemia: -statin  Dehydration/acute on chronic kidney disease stage IIIb:  -s/p IVF with resolution    Hypertensive urgency:  -Per  pharmacy tech, patient was not taking Coreg so that was discontinued.   -Due to elevated blood pressure she was started on Coreg 6.25 mg p.o. twice daily, 48 hours after presentation to the ED.  Gradually lower blood pressure now.  Dysphagia: SLP on board.  - dysphagia 1 diet.  Oral candidiasis:  -Nystatin swish and swallow.   Hypokalemia/hypomagnesemia: -replete  Acquired hypothyroidism: Continue Synthroid.   Asthma, mild intermittent: Completely asymptomatic.  Resume albuterol inhaler.   History of CAD: Continue Plavix.  She is asymptomatic.   History of depression: Resume Zoloft.   Type 2 diabetes mellitus: Recent hemoglobin A1c in chart was 7.7 and 6.1 this hospitalization.  She does not appear to be on medications for this.  Continue SSI.   DVT prophylaxis: enoxaparin (LOVENOX) injection 30 mg Start: 10/08/23 1800   Code Status: Full Code  Updated daughter with d/c plan for tomorrow after hospital bed delivered  Status is: Inpatient Remains inpatient appropriate because: CVA work up   Consultants:  neurology     Subjective: Remains confused  Objective: Vitals:   10/13/23 0002 10/13/23 0347 10/13/23 0510 10/13/23 0813  BP: (!) 173/79 (!) 202/86 (!) 145/84 (!) 152/55  Pulse: 62 66  66  Resp:  17    Temp: 97.8 F (36.6 C) 98.1 F (36.7 C)  98.1 F (36.7 C)  TempSrc: Oral Oral  Oral  SpO2:  100%  100%  Weight:      Height:       No intake or output data in the 24 hours ending 10/13/23 0945  Filed Weights   10/08/23 1330  Weight: 62  kg    Examination:  In bed, attempting to feed self Rrr No increased work of breathing    Data Reviewed: I have personally reviewed following labs and imaging studies  CBC: Recent Labs  Lab 10/08/23 1328 10/08/23 1900 10/10/23 0903  WBC 6.7 6.3 6.0  NEUTROABS 4.0  --  3.8  HGB 11.7* 10.4* 11.2*  HCT 34.0* 31.3* 33.0*  MCV 85.9 87.2 85.7  PLT 219 215 226   Basic Metabolic Panel: Recent Labs  Lab  10/08/23 1328 10/08/23 1900 10/09/23 1310 10/10/23 0903 10/11/23 0518  NA 141  --  141 141 140  K 3.3*  --  3.7 2.9* 3.7  CL 105  --  109 109 111  CO2 27  --  23 22 19*  GLUCOSE 121*  --  107* 107* 126*  BUN 25*  --  23 19 23   CREATININE 1.89* 1.81* 1.59* 1.37* 1.46*  CALCIUM 9.3  --  8.8* 8.6* 9.0  MG  --   --   --  1.6* 2.5*   GFR: Estimated Creatinine Clearance: 26.9 mL/min (A) (by C-G formula based on SCr of 1.46 mg/dL (H)). Liver Function Tests: Recent Labs  Lab 10/08/23 1328  AST 17  ALT 16  ALKPHOS 59  BILITOT 1.0  PROT 7.0  ALBUMIN 3.6   No results for input(s): "LIPASE", "AMYLASE" in the last 168 hours. Recent Labs  Lab 10/08/23 1328  AMMONIA 16   Coagulation Profile: No results for input(s): "INR", "PROTIME" in the last 168 hours. Cardiac Enzymes: No results for input(s): "CKTOTAL", "CKMB", "CKMBINDEX", "TROPONINI" in the last 168 hours. BNP (last 3 results) No results for input(s): "PROBNP" in the last 8760 hours. HbA1C: No results for input(s): "HGBA1C" in the last 72 hours.  CBG: Recent Labs  Lab 10/12/23 1211 10/12/23 1646 10/12/23 1720 10/12/23 2024 10/13/23 0814  GLUCAP 209* 65* 79 96 148*   Lipid Profile: No results for input(s): "CHOL", "HDL", "LDLCALC", "TRIG", "CHOLHDL", "LDLDIRECT" in the last 72 hours.  Thyroid Function Tests: No results for input(s): "TSH", "T4TOTAL", "FREET4", "T3FREE", "THYROIDAB" in the last 72 hours.  Anemia Panel: No results for input(s): "VITAMINB12", "FOLATE", "FERRITIN", "TIBC", "IRON", "RETICCTPCT" in the last 72 hours. Sepsis Labs: No results for input(s): "PROCALCITON", "LATICACIDVEN" in the last 168 hours.  No results found for this or any previous visit (from the past 240 hour(s)).   Radiology Studies: VAS US CAROTID  Result Date: 10/12/2023 Carotid Arterial Duplex Study Patient Name:  Diana Harper  Date of Exam:   10/12/2023 Medical Rec #: [id number]       Accession #:    [id number] Date of  Birth: [birth year]      Patient Gender: F Patient Age:   35 years Exam Location:  [facility] Procedure:      VAS US CAROTID Referring Phys: Hetty Blend --------------------------------------------------------------------------------  Indications:       CVA, Speech disturbance, Weakness and stenosis. Risk Factors:      Hypertension, hyperlipidemia, coronary artery disease. Comparison Study:  Previous study on 5.21.2024. Performing Technologist: Fernande Bras  Examination Guidelines: A complete evaluation includes B-mode imaging, spectral Doppler, color Doppler, and power Doppler as needed of all accessible portions of each vessel. Bilateral testing is considered an integral part of a complete examination. Limited examinations for reoccurring indications may be performed as noted.  Right Carotid Findings: +----------+--------+--------+--------+------------------+--------+           PSV cm/sEDV cm/sStenosisPlaque DescriptionComments +----------+--------+--------+--------+------------------+--------+ CCA Prox  106  17                                         +----------+--------+--------+--------+------------------+--------+ CCA Distal64      10                                         +----------+--------+--------+--------+------------------+--------+ ICA Prox  75      10                                         +----------+--------+--------+--------+------------------+--------+ ICA Mid   60      15                                         +----------+--------+--------+--------+------------------+--------+ ICA Distal111     18                                         +----------+--------+--------+--------+------------------+--------+ ECA       88      8                                          +----------+--------+--------+--------+------------------+--------+ +----------+--------+-------+--------+-------------------+           PSV cm/sEDV cmsDescribeArm  Pressure (mmHG) +----------+--------+-------+--------+-------------------+ Subclavian115     16                                 +----------+--------+-------+--------+-------------------+ +---------+--------+--+--------+-+ VertebralPSV cm/s41EDV cm/s7 +---------+--------+--+--------+-+ Limited exam due to patient not being able to follow instructions. Left Carotid Findings: +----------+--------+--------+--------+------------------+--------+           PSV cm/sEDV cm/sStenosisPlaque DescriptionComments +----------+--------+--------+--------+------------------+--------+ CCA Prox  106     16                                         +----------+--------+--------+--------+------------------+--------+ CCA Distal80      14                                         +----------+--------+--------+--------+------------------+--------+ ICA Prox  86      11                                         +----------+--------+--------+--------+------------------+--------+ ICA Mid   53      14                                         +----------+--------+--------+--------+------------------+--------+ ICA Distal118     31                                         +----------+--------+--------+--------+------------------+--------+  ECA       60      10                                         +----------+--------+--------+--------+------------------+--------+ +----------+--------+--------+--------+-------------------+           PSV cm/sEDV cm/sDescribeArm Pressure (mmHG) +----------+--------+--------+--------+-------------------+ Subclavian100     13                                  +----------+--------+--------+--------+-------------------+ +---------+--------+--+--------+--+ VertebralPSV cm/s93EDV cm/s18 +---------+--------+--+--------+--+   Summary: Right Carotid: Velocities in the right ICA are consistent with a 1-39% stenosis.                The ECA appears <50%  stenosed. Left Carotid: Velocities in the left ICA are consistent with a 1-39% stenosis.               The ECA appears <50% stenosed. Vertebrals:  Bilateral vertebral arteries demonstrate antegrade flow. Subclavians: Normal flow hemodynamics were seen in bilateral subclavian              arteries. *See table(s) above for measurements and observations.  Electronically signed by Lemar Livings MD on 10/12/2023 at 4:19:02 PM.    Final    ECHOCARDIOGRAM COMPLETE  Result Date: 10/12/2023    ECHOCARDIOGRAM REPORT   Patient Name:   Diana Harper Date of Exam: 10/12/2023 Medical Rec #:  [id number]      Height:       66.0 in Accession #:    [id number]     Weight:       136.7 lb Date of Birth:  1936/08/01     BSA:          1.701 m Patient Age:    86 years       BP:           147/68 mmHg Patient Gender: F              HR:           68 bpm. Exam Location:  Inpatient Procedure: 2D Echo, Cardiac Doppler and Color Doppler Indications:    stroke  History:        Patient has prior history of Echocardiogram examinations, most                 recent 04/19/2023. Stroke; Risk Factors:Diabetes, Dyslipidemia                 and Hypertension.  Sonographer:    Melissa Morford RDCS (AE, PE) Referring Phys: Marvel Plan  Sonographer Comments: Image acquisition challenging due to uncooperative patient. IMPRESSIONS  1. Left ventricular ejection fraction, by estimation, is 55 to 60%. The left ventricle has normal function. The left ventricle has no regional wall motion abnormalities. Left ventricular diastolic parameters are consistent with Grade I diastolic dysfunction (impaired relaxation).  2. Right ventricular systolic function is normal. The right ventricular size is mildly enlarged.  3. Left atrial size was mildly dilated.  4. A small pericardial effusion is present. The pericardial effusion is posterior to the left ventricle.  5. The mitral valve is grossly normal. No evidence of mitral valve regurgitation. No evidence of mitral  stenosis.  6. The aortic valve is calcified. There is moderate calcification of the aortic valve. There is moderate thickening of the  aortic valve. Aortic valve regurgitation is not visualized. Aortic valve sclerosis/calcification is present, without any evidence of aortic stenosis. Conclusion(s)/Recommendation(s): No intracardiac source of embolism detected on this transthoracic study. Consider a transesophageal echocardiogram to exclude cardiac source of embolism if clinically indicated. FINDINGS  Left Ventricle: Left ventricular ejection fraction, by estimation, is 55 to 60%. The left ventricle has normal function. The left ventricle has no regional wall motion abnormalities. Definity contrast agent was given IV to delineate the left ventricular  endocardial borders. The left ventricular internal cavity size was normal in size. There is no left ventricular hypertrophy. Left ventricular diastolic parameters are consistent with Grade I diastolic dysfunction (impaired relaxation). Right Ventricle: The right ventricular size is mildly enlarged. No increase in right ventricular wall thickness. Right ventricular systolic function is normal. Left Atrium: Left atrial size was mildly dilated. Right Atrium: Right atrial size was not well visualized. Pericardium: A small pericardial effusion is present. The pericardial effusion is posterior to the left ventricle. Mitral Valve: The mitral valve is grossly normal. No evidence of mitral valve regurgitation. No evidence of mitral valve stenosis. Tricuspid Valve: The tricuspid valve is grossly normal. Tricuspid valve regurgitation is trivial. No evidence of tricuspid stenosis. Aortic Valve: The aortic valve is calcified. There is moderate calcification of the aortic valve. There is moderate thickening of the aortic valve. Aortic valve regurgitation is not visualized. Aortic valve sclerosis/calcification is present, without any  evidence of aortic stenosis. Aortic valve mean  gradient measures 9.5 mmHg. Aortic valve peak gradient measures 15.3 mmHg. Aortic valve area, by VTI measures 1.23 cm. Pulmonic Valve: The pulmonic valve was grossly normal. Pulmonic valve regurgitation is trivial. No evidence of pulmonic stenosis. Aorta: The aortic root and ascending aorta are structurally normal, with no evidence of dilitation. Venous: The inferior vena cava was not well visualized. IAS/Shunts: The interatrial septum was not well visualized.  LEFT VENTRICLE PLAX 2D LVIDd:         4.70 cm   Diastology LVIDs:         3.10 cm   LV e' medial:    3.92 cm/s LV PW:         1.10 cm   LV E/e' medial:  19.7 LV IVS:        1.00 cm   LV e' lateral:   3.37 cm/s LVOT diam:     1.90 cm   LV E/e' lateral: 22.9 LV SV:         49 LV SV Index:   29 LVOT Area:     2.84 cm  LEFT ATRIUM           Index LA diam:      2.90 cm 1.70 cm/m LA Vol (A4C): 58.9 ml 34.62 ml/m  AORTIC VALVE AV Area (Vmax):    1.36 cm AV Area (Vmean):   1.24 cm AV Area (VTI):     1.23 cm AV Vmax:           195.50 cm/s AV Vmean:          145.500 cm/s AV VTI:            0.395 m AV Peak Grad:      15.3 mmHg AV Mean Grad:      9.5 mmHg LVOT Vmax:         93.80 cm/s LVOT Vmean:        63.800 cm/s LVOT VTI:          0.172 m LVOT/AV VTI ratio:  0.44  AORTA Ao Root diam: 2.90 cm Ao Asc diam:  3.00 cm MITRAL VALVE                TRICUSPID VALVE MV Area (PHT): 5.16 cm     TR Peak grad:   16.6 mmHg MV Decel Time: 147 msec     TR Vmax:        204.00 cm/s MV E velocity: 77.10 cm/s MV A velocity: 101.00 cm/s  SHUNTS MV E/A ratio:  0.76         Systemic VTI:  0.17 m                             Systemic Diam: 1.90 cm Lennie Odor MD Electronically signed by Lennie Odor MD Signature Date/Time: 10/12/2023/3:19:52 PM    Final    MR ANGIO HEAD WO CONTRAST  Result Date: 10/11/2023 CLINICAL DATA:  Follow-up examination for stroke. EXAM: MRA HEAD WITHOUT CONTRAST TECHNIQUE: Angiographic images of the Circle of Willis were acquired using MRA technique  without intravenous contrast. COMPARISON:  Prior MRI from 10/10/2023. FINDINGS: Anterior circulation: Examination mildly degraded by motion artifact. Both internal carotid arteries are patent through the siphon to the termini without significant stenosis or other abnormality. Left A1 segment dominant and patent. Focal severe stenosis at the mid right A1 segment (series 352, image 1). Normal anterior communicating artery complex. Partially visualized anterior cerebral arteries grossly patent without visible stenosis. No M1 stenosis or occlusion. No visible proximal MCA branch occlusion or high-grade stenosis. Distal MCA branches grossly perfused and symmetric. Posterior circulation: Both vertebral arteries patent to the vertebrobasilar junction without stenosis. Left vertebral artery dominant. Both PICA grossly patent at their origins. Basilar patent without stenosis. Superior cerebral arteries patent bilaterally. Both PCAs supplied via the basilar. Visualized PCAs are grossly patent to their distal aspects without visible stenosis. Anatomic variants: As above. Other: No intracranial aneurysm. IMPRESSION: 1. Negative intracranial MRA for large vessel occlusion. 2. Focal severe stenosis at the mid right A1 segment. 3. Otherwise negative intracranial MRA. Electronically Signed   By: Rise Mu M.D.   On: 10/11/2023 20:53    Scheduled Meds:  ARIPiprazole  2 mg Oral Daily   aspirin EC  81 mg Oral Daily   atorvastatin  40 mg Oral Daily   carvedilol  6.25 mg Oral BID   cholecalciferol  5,000 Units Oral Daily   clopidogrel  75 mg Oral Daily   enoxaparin (LOVENOX) injection  30 mg Subcutaneous Q24H   insulin aspart  0-15 Units Subcutaneous TID WC   insulin aspart  0-5 Units Subcutaneous QHS   levothyroxine  88 mcg Oral QAC breakfast   nystatin  5 mL Oral QID   sertraline  150 mg Oral QHS   Continuous Infusions:     LOS: 3 days   Joseph Art, DO [facility]  10/13/2023, 9:45 AM     How to contact the [facility] Attending or Consulting provider 7A - 7P or covering provider during after hours 7P -7A, for this patient?  Check the care team in [facility] and look for a) attending/consulting [facility] provider listed and b) the [facility] team listed. Page or secure chat 7A-7P. Log into [email or web address] and use [place]'s universal password to access. If you do not have the password, please contact the hospital operator. Locate the [facility] provider you are looking for under [facility] and page to a number that you can be directly  reached. If you still have difficulty reaching the provider, please page the [facility] (Director on Call) for the Hospitalists listed on amion for assistance.

## 2023-10-13 NOTE — Progress Notes (Addendum)
Physical Therapy Treatment Patient Details Name: Diana Harper MRN: [id number] DOB: 04-21-1936 Today's Date: 10/13/2023   History of Present Illness Patient is a 87 year old female who was admitted with AMS, slurred speech, decreased appetite and weakness.patient was admitted with acute metabolic encephalopathy, dehydration, hypertensive urgency,  CT head: negative for acute changes; MRI + for B basal ganglia CVA with additional pons and R cerebellar lacunar infarcts.  PMH: dementia, CKD III, asthma, hyperlipidemia, HTN, CAD, depression.    PT Comments  Pt received in supine and able to be aroused although pt keeps eyes closed more majority of session despite cues. Pt inconsistently follows commands requiring increased assist during session. Pt able to perform limited BLE exercise while sitting EOB with intermittent assist and cues to maintain sitting balance. Attempted 2 standing trials with pt unable to reach a full upright posture with max A +2. Pt then reported fatigue and requested to return to supine. Pt continues to benefit from PT services to progress toward functional mobility goals.    If plan is discharge home, recommend the following: Two people to help with walking and/or transfers;Two people to help with bathing/dressing/bathroom   Can travel by private vehicle     No  Equipment Recommendations  Other (comment);Hoyer lift;Hospital bed;Wheelchair (measurements PT);Wheelchair cushion (measurements PT)    Recommendations for Other Services       Precautions / Restrictions Precautions Precautions: Fall Restrictions Weight Bearing Restrictions: No     Mobility  Bed Mobility Overal bed mobility: Needs Assistance Bed Mobility: Supine to Sit, Sit to Supine     Supine to sit: Max assist, HOB elevated Sit to supine: Total assist   General bed mobility comments: Pt able to minimally advance BLE to EOB, however requires max A for all aspects. Pt appears to set hands on the  bed to attempt to scoot forward at EOB, however does not initiate the movement. Pt unable to follow cues to lean onto L shoulder and elevate LEs to EOB, requiring total A to return to supine    Transfers Overall transfer level: Needs assistance Equipment used: 2 person hand held assist Transfers: Sit to/from Stand Sit to Stand: Max assist, +2 physical assistance           General transfer comment: STS from EOB x2 with feet blocked and max A +2 for power up. Pt unable to reach a full upright posture with stiff BLE and trunk flexed despite cues   Modified Rankin Pre-Morbid Rankin Score: (Moderate disability) Modified Rankin: (Severe disability)     Balance Overall balance assessment: Needs assistance Sitting-balance support: Bilateral upper extremity supported Sitting balance-Leahy Scale: Poor Sitting balance - Comments: L lean requiring assist initially progressing to CGA-min A intermittently   Standing balance support: Bilateral upper extremity supported Standing balance-Leahy Scale: Zero Standing balance comment: requires +2 assist to maintain standing                            Cognition Arousal: Alert Behavior During Therapy: Flat affect Overall Cognitive Status: Impaired/Different from baseline                                 General Comments: Pt has eyes closed for most of session despite cues.        Exercises General Exercises - Lower Extremity Long Arc Quad: AROM, Seated, Both, 5 reps    General Comments  Pertinent Vitals/Pain Pain Assessment Pain Assessment: Faces Faces Pain Scale: Hurts a little bit Pain Location: generalized with movement Pain Descriptors / Indicators: Grimacing, Discomfort Pain Intervention(s): Monitored during session, Repositioned     PT Goals (current goals can now be found in the care plan section) Acute Rehab PT Goals Patient Stated Goal: pt unable PT Goal Formulation: Patient unable to  participate in goal setting Time For Goal Achievement: 10/24/23 Progress towards PT goals: Progressing toward goals    Frequency    Min 1X/week       AM-PAC PT "6 Clicks" Mobility   Outcome Measure  Help needed turning from your back to your side while in a flat bed without using bedrails?: Total Help needed moving from lying on your back to sitting on the side of a flat bed without using bedrails?: Total Help needed moving to and from a bed to a chair (including a wheelchair)?: Total Help needed standing up from a chair using your arms (e.g., wheelchair or bedside chair)?: Total Help needed to walk in hospital room?: Total Help needed climbing 3-5 steps with a railing? : Total 6 Click Score: 6    End of Session Equipment Utilized During Treatment: Gait belt Activity Tolerance: Patient limited by fatigue Patient left: in bed;with call bell/phone within reach;with bed alarm set Nurse Communication: Mobility status PT Visit Diagnosis: Other abnormalities of gait and mobility (R26.89);Muscle weakness (generalized) (M62.81)     Time: 8119-1478 PT Time Calculation (min) (ACUTE ONLY): 18 min  Charges:    $Therapeutic Activity: 8-22 mins PT General Charges $$ ACUTE PT VISIT: 1 Visit                     Johny Shock, PTA Acute Rehabilitation Services Secure Chat Preferred  [phone]    Johny Shock 10/13/2023, 12:49 PM

## 2023-10-13 NOTE — Progress Notes (Signed)
Speech Language Pathology Treatment: Dysphagia;Cognitive-Linquistic  Patient Details Name: Diana Harper MRN: [id number] DOB: 09/14/1936 Today's Date: 10/13/2023 Time: 4098-1191 SLP Time Calculation (min) (ACUTE ONLY): 26 min  Assessment / Plan / Recommendation Clinical Impression  Pt seen this am and during pill administration with RN. Pt needed assist with meal to initiate self feeding as she is inattentive to her left visual field, but is using her left hand to feed herself, so she forgets about the cup or banana in her hand. No signs of aspiration. She was able to masticate a banana and diced pancake. Prolonged mastication of cut up sausage but she had no pocketing. Some foods will be more appropriate than others. Pt successful with pills with verbal cueing from RN on her right side. She is worried and bit suspicious and it helps if she understands the task with simple instructions. Comprehension is quite good, but impacted by dementia. Language fluency and word finding improving. SLP able to cue pt frequently by mirroring her utterances back with correction. Pt repeats. Doing well.   HPI HPI: Diana Harper is a 87 y.o. female with medical history significant of multiple medical problems including CKD stage IIIa, asthma, hyperlipidemia, hypertension, CAD was brought into the emergency department with decreased appetite, weakness, altered mental status and slurred speech.  History was mostly obtained from the daughter who is at the bedside.  Patient has dementia and typically is only oriented to herself, her date of birth and family members around her.  Reportedly, since Wednesday, patient has been moving less often, has been speaking less often, has been eating less often as well.  Pt with worsening weakness and left inattention - MRI conducted and ptr found to have bilateral basal ganglia CVA, nonhemorrhagic - additional pons and right cerebellum lacunar cva.  Follow up for swallowing indicated.       SLP Plan  Continue with current plan of care      Recommendations for follow up therapy are one component of a multi-disciplinary discharge planning process, led by the attending physician.  Recommendations may be updated based on patient status, additional functional criteria and insurance authorization.    Recommendations  Diet recommendations: Dysphagia 3 (mechanical soft);Thin liquid Liquids provided via: Cup;Straw Medication Administration: Whole meds with liquid Supervision: Full supervision/cueing for compensatory strategies Compensations: Slow rate;Small sips/bites                      Frequent or constant Supervision/Assistance Aphasia (R47.01)     Continue with current plan of care     Diana Harper, [name]  10/13/2023, 10:17 AM

## 2023-10-13 NOTE — Care Management Important Message (Signed)
Important Message  Patient Details  Name: Diana Harper MRN: [id number] Date of Birth: Jan 21, 1936   Important Message Given:  Yes - Medicare IM     [name] 10/13/2023, 2:56 PM

## 2023-10-14 ENCOUNTER — Other Ambulatory Visit (HOSPITAL_COMMUNITY): Payer: Self-pay

## 2023-10-14 DIAGNOSIS — F0390 Unspecified dementia without behavioral disturbance: Secondary | ICD-10-CM | POA: Diagnosis not present

## 2023-10-14 DIAGNOSIS — R4182 Altered mental status, unspecified: Secondary | ICD-10-CM | POA: Diagnosis not present

## 2023-10-14 DIAGNOSIS — R630 Anorexia: Secondary | ICD-10-CM

## 2023-10-14 LAB — BASIC METABOLIC PANEL
Anion gap: 7 (ref 5–15)
BUN: 22 mg/dL (ref 8–23)
CO2: 23 mmol/L (ref 22–32)
Calcium: 9 mg/dL (ref 8.9–10.3)
Chloride: 111 mmol/L (ref 98–111)
Creatinine, Ser: 1.89 mg/dL — ABNORMAL HIGH (ref 0.44–1.00)
GFR, Estimated: 26 mL/min — ABNORMAL LOW (ref >60)
Glucose, Bld: 127 mg/dL — ABNORMAL HIGH (ref 70–99)
Potassium: 3.7 mmol/L (ref 3.5–5.1)
Sodium: 141 mmol/L (ref 135–145)

## 2023-10-14 LAB — GLUCOSE, CAPILLARY
Glucose-Capillary: 119 mg/dL — ABNORMAL HIGH (ref 70–99)
Glucose-Capillary: 135 mg/dL — ABNORMAL HIGH (ref 70–99)

## 2023-10-14 MED ORDER — ATORVASTATIN CALCIUM 40 MG PO TABS
40.0000 mg | ORAL_TABLET | Freq: Every day | ORAL | 0 refills | Status: DC
  Filled 2023-10-14: qty 30, 30d supply, fill #0

## 2023-10-14 MED ORDER — AMLODIPINE BESYLATE 5 MG PO TABS
5.0000 mg | ORAL_TABLET | Freq: Every day | ORAL | 0 refills | Status: DC
  Filled 2023-10-14: qty 30, 30d supply, fill #0

## 2023-10-14 MED ORDER — NYSTATIN 100000 UNIT/ML MT SUSP: 5.0000 mL | Freq: Four times a day (QID) | OROMUCOSAL | 0 refills | Status: AC

## 2023-10-14 MED ORDER — CARVEDILOL 6.25 MG PO TABS
6.2500 mg | ORAL_TABLET | Freq: Two times a day (BID) | ORAL | 0 refills | Status: DC
  Filled 2023-10-14: qty 60, 30d supply, fill #0

## 2023-10-14 MED ORDER — AMLODIPINE BESYLATE 5 MG PO TABS
5.0000 mg | ORAL_TABLET | Freq: Every day | ORAL | Status: DC
  Administered 2023-10-14: 5 mg via ORAL
  Filled 2023-10-14: qty 1

## 2023-10-14 MED ORDER — CLOPIDOGREL BISULFATE 75 MG PO TABS: 75.0000 mg | ORAL_TABLET | Freq: Every day | ORAL | Status: AC

## 2023-10-14 MED ORDER — INSULIN DEGLUDEC 200 UNIT/ML ~~LOC~~ SOPN: 15.0000 [IU] | PEN_INJECTOR | SUBCUTANEOUS | Status: DC

## 2023-10-14 MED ORDER — ASPIRIN 81 MG PO TBEC: 81.0000 mg | DELAYED_RELEASE_TABLET | Freq: Every day | ORAL | Status: AC

## 2023-10-14 NOTE — Plan of Care (Signed)
  Problem: Education: Goal: Ability to describe self-care measures that may prevent or decrease complications (Diabetes Survival Skills Education) will improve Outcome: Progressing   Problem: Coping: Goal: Ability to adjust to condition or change in health will improve Outcome: Progressing   Problem: Fluid Volume: Goal: Ability to maintain a balanced intake and output will improve Outcome: Progressing   Problem: Health Behavior/Discharge Planning: Goal: Ability to identify and utilize available resources and services will improve Outcome: Progressing Goal: Ability to manage health-related needs will improve Outcome: Progressing   Problem: Metabolic: Goal: Ability to maintain appropriate glucose levels will improve Outcome: Progressing   Problem: Nutritional: Goal: Maintenance of adequate nutrition will improve Outcome: Progressing Goal: Progress toward achieving an optimal weight will improve Outcome: Progressing   Problem: Skin Integrity: Goal: Risk for impaired skin integrity will decrease Outcome: Progressing   Problem: Tissue Perfusion: Goal: Adequacy of tissue perfusion will improve Outcome: Progressing   Problem: Education: Goal: Knowledge of disease or condition will improve Outcome: Progressing Goal: Knowledge of secondary prevention will improve (MUST DOCUMENT ALL) Outcome: Progressing Goal: Knowledge of patient specific risk factors will improve Loraine Leriche N/A or DELETE if not current risk factor) Outcome: Progressing   Problem: Ischemic Stroke/TIA Tissue Perfusion: Goal: Complications of ischemic stroke/TIA will be minimized Outcome: Progressing   Problem: Coping: Goal: Will verbalize positive feelings about self Outcome: Progressing Goal: Will identify appropriate support needs Outcome: Progressing   Problem: Health Behavior/Discharge Planning: Goal: Ability to manage health-related needs will improve Outcome: Progressing Goal: Goals will be  collaboratively established with patient/family Outcome: Progressing   Problem: Self-Care: Goal: Ability to participate in self-care as condition permits will improve Outcome: Progressing Goal: Verbalization of feelings and concerns over difficulty with self-care will improve Outcome: Progressing Goal: Ability to communicate needs accurately will improve Outcome: Progressing   Problem: Nutrition: Goal: Risk of aspiration will decrease Outcome: Progressing Goal: Dietary intake will improve Outcome: Progressing   Problem: Education: Goal: Knowledge of General Education information will improve Description: Including pain rating scale, medication(s)/side effects and non-pharmacologic comfort measures Outcome: Progressing   Problem: Health Behavior/Discharge Planning: Goal: Ability to manage health-related needs will improve Outcome: Progressing   Problem: Clinical Measurements: Goal: Ability to maintain clinical measurements within normal limits will improve Outcome: Progressing Goal: Will remain free from infection Outcome: Progressing Goal: Diagnostic test results will improve Outcome: Progressing Goal: Respiratory complications will improve Outcome: Progressing Goal: Cardiovascular complication will be avoided Outcome: Progressing   Problem: Activity: Goal: Risk for activity intolerance will decrease Outcome: Progressing   Problem: Nutrition: Goal: Adequate nutrition will be maintained Outcome: Progressing   Problem: Coping: Goal: Level of anxiety will decrease Outcome: Progressing   Problem: Elimination: Goal: Will not experience complications related to bowel motility Outcome: Progressing Goal: Will not experience complications related to urinary retention Outcome: Progressing   Problem: Pain Management: Goal: General experience of comfort will improve Outcome: Progressing   Problem: Safety: Goal: Ability to remain free from injury will improve Outcome:  Progressing   Problem: Skin Integrity: Goal: Risk for impaired skin integrity will decrease Outcome: Progressing

## 2023-10-14 NOTE — Discharge Summary (Signed)
Physician Discharge Summary  [name] ZOX:[id number] DOB: 04-20-1936 DOA: 10/08/2023  PCP: Lewis Moccasin, MD  Admit date: 10/08/2023 Discharge date: 10/14/2023  Admitted From: home Discharge disposition: home   Recommendations for Outpatient Follow-Up:   Home health Neurology follow up Titrate BP Meds to normotensive DAPT for 3 weeks and then aspirin alone   Discharge Diagnosis:   Principal Problem:   AMS (altered mental status) Active Problems:   Acute renal failure superimposed on stage 3a chronic kidney disease (HCC)   Hypothyroidism   Uncontrolled type 2 diabetes mellitus with hyperglycemia, without long-term current use of insulin (HCC)   Hypokalemia   Acute ischemic stroke [facility])   Hypertensive urgency   Dementia without behavioral disturbance (HCC)    Discharge Condition: Improved.  Diet recommendation: DYS 3 diet  Wound care: None.  Code status: Full.   History of Present Illness:    Diana Harper is a 87 y.o. female with medical history significant of multiple medical problems including CKD stage IIIa, asthma, hyperlipidemia, hypertension, CAD was brought into the emergency department with decreased appetite, weakness, altered mental status and slurred speech.  History was mostly obtained from the daughter who is at the bedside.  Patient has dementia and typically is only oriented to herself, her date of birth and family members around her.  Reportedly, since Wednesday, patient has been moving less often, has been speaking less often, has been eating less often as well.  She has been noted to have some more confusion than her baseline based on the dementia.  For all those reasons, she was brought into the emergency department.  No reports of chest pain, shortness of breath, cough, fever, chills, headache or any other complaint.    Hospital Course by Problem:   Acute metabolic encephalopathy/generalized weakness/multiple acute strokes,  likely embolic:  -CT head negative at the time of admission.  Patient became more alert and speech was more fluent next day of hospitalization but she still appeared dehydrated so IV fluids were continued.  However on the morning of 10/10/2023, patient appeared to have word finding difficulty.  This raised suspicion for stroke.  MRI done with embolic CVAs.   -neurology consulted-- DAPT for 3 weeks and then aspirin alone  -echo done   Hyperlipidemia: -statin given-- tolerated in the hospital   Dehydration/acute on chronic kidney disease stage IIIb:  -s/p IVF with resolution  -outpatient follow up   Hypertensive urgency:  .  Gradually lower blood pressure now. -resume coreg and add norvasc   Dysphagia: SLP on board.  - dysphagia 3 diet.    Hypokalemia/hypomagnesemia: -repleted   Acquired hypothyroidism: Continue Synthroid.   Asthma, mild intermittent: Completely asymptomatic.  Resume albuterol inhaler.   History of CAD: Continue Plavix.  She is asymptomatic.   History of depression: Resume Zoloft.   Type 2 diabetes mellitus: hemoglobin A1c  6.1 this hospitalization.  At goal      Medical Consultants:      Discharge Exam:   Vitals:   10/14/23 0758 10/14/23 0827  BP: (!) 188/120 (!) 167/66  Pulse: 63   Resp:    Temp: (!) 97.3 F (36.3 C)   SpO2: 100%    Vitals:   10/14/23 0522 10/14/23 0604 10/14/23 0758 10/14/23 0827  BP: (!) 189/95 (!) 185/67 (!) 188/120 (!) 167/66  Pulse: 65 69 63   Resp: 17     Temp: 98.3 F (36.8 C)  (!) 97.3 F (36.3 C)   TempSrc: Oral  Oral   SpO2: 100% 100% 100%   Weight:      Height:        General exam: Appears calm and comfortable.    The results of significant diagnostics from this hospitalization (including imaging, microbiology, ancillary and laboratory) are listed below for reference.     Procedures and Diagnostic Studies:   DG Chest 2 View  Result Date: 10/08/2023 CLINICAL DATA:  Altered mental status. EXAM:  CHEST - 2 VIEW COMPARISON:  12/30/2022 FINDINGS: The cardio pericardial silhouette is enlarged. There is pulmonary vascular congestion. Mild interstitial pulmonary edema not excluded. No focal consolidation or substantial pleural effusion. No acute bony abnormality. IMPRESSION: Enlargement of the cardiopericardial silhouette with pulmonary vascular congestion. Mild interstitial pulmonary edema not excluded. Electronically Signed   By: Kennith Center M.D.   On: 10/08/2023 12:51   CT Head Wo Contrast  Result Date: 10/08/2023 CLINICAL DATA:  87 year old female with altered mental status, decreased p.o. intake. EXAM: CT HEAD WITHOUT CONTRAST TECHNIQUE: Contiguous axial images were obtained from the base of the skull through the vertex without intravenous contrast. RADIATION DOSE REDUCTION: This exam was performed according to the departmental dose-optimization program which includes automated exposure control, adjustment of the mA and/or kV according to patient size and/or use of iterative reconstruction technique. COMPARISON:  Head CT 09/09/2023 and earlier. FINDINGS: Brain: Stable cerebral volume. No midline shift, ventriculomegaly, mass effect, evidence of mass lesion, intracranial hemorrhage or evidence of cortically based acute infarction. Confluent bilateral cerebral white matter hypodensity, chronic cortical encephalomalacia in the left parietal lobe. Chronic right caudate and smaller thalamic lacunar infarcts. Stable gray-white matter differentiation throughout the brain. Vascular: Calcified atherosclerosis at the skull base. No suspicious intracranial vascular hyperdensity. Calcified basal ganglia vascular calcifications. Skull: Intact, negative. Sinuses/Orbits: Visualized paranasal sinuses and mastoids are stable and well aerated. Other: No acute orbit or scalp soft tissue finding. IMPRESSION: 1. No acute intracranial abnormality. 2. Stable non contrast CT appearance of advanced chronic ischemic disease.  Electronically Signed   By: Odessa Fleming M.D.   On: 10/08/2023 12:19     Labs:   Basic Metabolic Panel: Recent Labs  Lab 10/09/23 1310 10/10/23 0903 10/11/23 0518 10/13/23 1233 10/14/23 0544  NA 141 141 140 141 141  K 3.7 2.9* 3.7 3.7 3.7  CL 109 109 111 110 111  CO2 23 22 19* 23 23  GLUCOSE 107* 107* 126* 123* 127*  BUN 23 19 23  26* 22  CREATININE 1.59* 1.37* 1.46* 1.99* 1.89*  CALCIUM 8.8* 8.6* 9.0 9.4 9.0  MG  --  1.6* 2.5*  --   --    GFR Estimated Creatinine Clearance: 20.7 mL/min (A) (by C-G formula based on SCr of 1.89 mg/dL (H)). Liver Function Tests: Recent Labs  Lab 10/08/23 1328  AST 17  ALT 16  ALKPHOS 59  BILITOT 1.0  PROT 7.0  ALBUMIN 3.6   No results for input(s): "LIPASE", "AMYLASE" in the last 168 hours. Recent Labs  Lab 10/08/23 1328  AMMONIA 16   Coagulation profile No results for input(s): "INR", "PROTIME" in the last 168 hours.  CBC: Recent Labs  Lab 10/08/23 1328 10/08/23 1900 10/10/23 0903 10/13/23 1233  WBC 6.7 6.3 6.0 6.4  NEUTROABS 4.0  --  3.8  --   HGB 11.7* 10.4* 11.2* 10.7*  HCT 34.0* 31.3* 33.0* 31.9*  MCV 85.9 87.2 85.7 84.2  PLT 219 215 226 248   Cardiac Enzymes: No results for input(s): "CKTOTAL", "CKMB", "CKMBINDEX", "TROPONINI" in the last 168  hours. BNP: Invalid input(s): "POCBNP" CBG: Recent Labs  Lab 10/13/23 1335 10/13/23 1712 10/13/23 2002 10/14/23 0800 10/14/23 1153  GLUCAP 114* 107* 190* 119* 135*   D-Dimer No results for input(s): "DDIMER" in the last 72 hours. Hgb A1c No results for input(s): "HGBA1C" in the last 72 hours. Lipid Profile No results for input(s): "CHOL", "HDL", "LDLCALC", "TRIG", "CHOLHDL", "LDLDIRECT" in the last 72 hours. Thyroid function studies No results for input(s): "TSH", "T4TOTAL", "T3FREE", "THYROIDAB" in the last 72 hours.  Invalid input(s): "FREET3" Anemia work up No results for input(s): "VITAMINB12", "FOLATE", "FERRITIN", "TIBC", "IRON", "RETICCTPCT" in the last 72  hours. Microbiology No results found for this or any previous visit (from the past 240 hour(s)).   Discharge Instructions:   Discharge Instructions     Ambulatory referral to Neurology   Complete by: As directed    Follow up with Dr. Loleta Chance at [facility] in 4 weeks.  Patient is Dr. Adaline Sill patient. Thanks.   Discharge instructions   Complete by: As directed    DYS 3 diet ASA plus plavix x 3 weeks then ASA alone   Increase activity slowly   Complete by: As directed       Allergies as of 10/14/2023       Reactions   Latex Anaphylaxis, Shortness Of Breath, Other (See Comments)   Severe respiratory distress   Onion Diarrhea   Other Other (See Comments)   Patient cannot have TREE NUTS and anything with seeds- History of  diverticulitis    Flexeril [cyclobenzaprine] Other (See Comments)   Caused agitation   Lisinopril Cough   Metformin And Related Diarrhea   Simvastatin Hives, Rash   Tetracycline Rash   Zithromax [azithromycin] Rash        Medication List     TAKE these medications    acetaminophen 500 MG tablet Commonly known as: TYLENOL Take 1,000 mg by mouth every 8 (eight) hours as needed for mild pain, headache or fever.   albuterol 108 (90 Base) MCG/ACT inhaler Commonly known as: VENTOLIN HFA Inhale 2 puffs into the lungs every 4 (four) hours as needed for wheezing or shortness of breath.   amLODipine 5 MG tablet Commonly known as: NORVASC Take 1 tablet (5 mg total) by mouth daily.   ARIPiprazole 2 MG tablet Commonly known as: ABILIFY Take 2 mg by mouth daily.   aspirin EC 81 MG tablet Take 1 tablet (81 mg total) by mouth daily. Swallow whole. Start taking on: October 15, 2023   atorvastatin 40 MG tablet Commonly known as: LIPITOR Take 1 tablet (40 mg total) by mouth daily. Start taking on: October 15, 2023   carvedilol 6.25 MG tablet Commonly known as: COREG Take 1 tablet (6.25 mg total) by mouth 2 (two) times daily with a meal.   clopidogrel 75 MG  tablet Commonly known as: PLAVIX Take 1 tablet (75 mg total) by mouth daily for 19 days.   D-Mannose 500 MG Caps Take 500 mg by mouth daily.   diclofenac Sodium 1 % Gel Commonly known as: VOLTAREN Apply 2 g topically 4 (four) times daily as needed (joint pain).   FreeStyle Libre 14 Day Sensor Misc Inject 1 Device into the skin every 14 (fourteen) days.   FreeStyle Coulter 2 Reader Hardie Pulley See admin instructions.   Glucagon HCl 1 MG Solr Inject 1 mg into the skin as needed (for a blood sugar <67).   insulin degludec 200 UNIT/ML FlexTouch Pen Commonly known as: TRESIBA Inject 16 Units into  the skin See admin instructions. 50 units SQ once daily if BG is greater than 150 What changed:  how much to take additional instructions   L-Theanine 100 MG Caps Take 100 mg by mouth daily.   levothyroxine 88 MCG tablet Commonly known as: SYNTHROID Take 88 mcg by mouth daily before breakfast.   nitroGLYCERIN 0.4 MG SL tablet Commonly known as: NITROSTAT Place 1 tablet (0.4 mg total) under the tongue every 5 (five) minutes as needed for chest pain.   NovoLOG FlexPen 100 UNIT/ML FlexPen Generic drug: insulin aspart Inject 5-10 Units into the skin See admin instructions. Inject 5 units into the skin before meals if BGL is 100-150, 7 units if BGL is 151-200, and 10 units if BGL is greater than 200   sertraline 50 MG tablet Commonly known as: ZOLOFT Take 150 mg by mouth at bedtime.   Vitamin D3 125 MCG (5000 UT) Caps Take 5,000 Units by mouth daily.               Durable Medical Equipment  (From admission, onward)           Start     Ordered   10/12/23 1550  For home use only DME Hospital bed  Once       Comments: Call daughter [name] [phone] for delivery  Question Answer Comment  Length of Need Lifetime   Patient has (list medical condition): Acute metabolic encephalopathy/generalized weakness/multiple acute strokes   The above medical condition requires: Patient  requires the ability to reposition frequently   Head must be elevated greater than: 45 degrees   Bed type Semi-electric   Support Surface: Gel Overlay      10/12/23 1551            Follow-up Information     [facility] Follow up.   Specialty: Home Health Services Contact information: [address] [place] [address] [place] [address] [phone]         [facility] Follow up.   Contact information: [address] [place] [address] [phone]        Antony Madura, MD. Schedule an appointment as soon as possible for a visit in 1 month(s).   Specialty: Neurology Contact information: [address] [place] [address] [phone]                  Time coordinating discharge: 45 min  Signed:  Joseph Art DO  [facility] 10/14/2023, 1:17 PM

## 2023-10-14 NOTE — Plan of Care (Signed)
 Problem: Education: Goal: Ability to describe self-care measures that may prevent or decrease complications (Diabetes Survival Skills Education) will improve Outcome: Adequate for Discharge Goal: Individualized Educational Video(s) Outcome: Adequate for Discharge   Problem: Coping: Goal: Ability to adjust to condition or change in health will improve Outcome: Adequate for Discharge   Problem: Fluid Volume: Goal: Ability to maintain a balanced intake and output will improve Outcome: Adequate for Discharge   Problem: Health Behavior/Discharge Planning: Goal: Ability to identify and utilize available resources and services will improve Outcome: Adequate for Discharge Goal: Ability to manage health-related needs will improve Outcome: Adequate for Discharge   Problem: Metabolic: Goal: Ability to maintain appropriate glucose levels will improve Outcome: Adequate for Discharge   Problem: Nutritional: Goal: Maintenance of adequate nutrition will improve Outcome: Adequate for Discharge Goal: Progress toward achieving an optimal weight will improve Outcome: Adequate for Discharge   Problem: Skin Integrity: Goal: Risk for impaired skin integrity will decrease Outcome: Adequate for Discharge   Problem: Tissue Perfusion: Goal: Adequacy of tissue perfusion will improve Outcome: Adequate for Discharge   Problem: Education: Goal: Knowledge of disease or condition will improve Outcome: Adequate for Discharge Goal: Knowledge of secondary prevention will improve (MUST DOCUMENT ALL) Outcome: Adequate for Discharge Goal: Knowledge of patient specific risk factors will improve Loraine Leriche N/A or DELETE if not current risk factor) Outcome: Adequate for Discharge   Problem: Ischemic Stroke/TIA Tissue Perfusion: Goal: Complications of ischemic stroke/TIA will be minimized Outcome: Adequate for Discharge   Problem: Coping: Goal: Will verbalize positive feelings about self Outcome: Adequate for  Discharge Goal: Will identify appropriate support needs Outcome: Adequate for Discharge   Problem: Health Behavior/Discharge Planning: Goal: Ability to manage health-related needs will improve Outcome: Adequate for Discharge Goal: Goals will be collaboratively established with patient/family Outcome: Adequate for Discharge   Problem: Self-Care: Goal: Ability to participate in self-care as condition permits will improve Outcome: Adequate for Discharge Goal: Verbalization of feelings and concerns over difficulty with self-care will improve Outcome: Adequate for Discharge Goal: Ability to communicate needs accurately will improve Outcome: Adequate for Discharge   Problem: Nutrition: Goal: Risk of aspiration will decrease Outcome: Adequate for Discharge Goal: Dietary intake will improve Outcome: Adequate for Discharge   Problem: Education: Goal: Knowledge of General Education information will improve Description: Including pain rating scale, medication(s)/side effects and non-pharmacologic comfort measures Outcome: Adequate for Discharge   Problem: Health Behavior/Discharge Planning: Goal: Ability to manage health-related needs will improve Outcome: Adequate for Discharge   Problem: Clinical Measurements: Goal: Ability to maintain clinical measurements within normal limits will improve Outcome: Adequate for Discharge Goal: Will remain free from infection Outcome: Adequate for Discharge Goal: Diagnostic test results will improve Outcome: Adequate for Discharge Goal: Respiratory complications will improve Outcome: Adequate for Discharge Goal: Cardiovascular complication will be avoided Outcome: Adequate for Discharge   Problem: Activity: Goal: Risk for activity intolerance will decrease Outcome: Adequate for Discharge   Problem: Nutrition: Goal: Adequate nutrition will be maintained Outcome: Adequate for Discharge   Problem: Coping: Goal: Level of anxiety will  decrease Outcome: Adequate for Discharge   Problem: Elimination: Goal: Will not experience complications related to bowel motility Outcome: Adequate for Discharge Goal: Will not experience complications related to urinary retention Outcome: Adequate for Discharge   Problem: Pain Management: Goal: General experience of comfort will improve Outcome: Adequate for Discharge   Problem: Safety: Goal: Ability to remain free from injury will improve Outcome: Adequate for Discharge   Problem: Skin Integrity: Goal: Risk for  impaired skin integrity will decrease Outcome: Adequate for Discharge

## 2023-10-14 NOTE — TOC Transition Note (Signed)
Transition of Care [facility]) - CM/SW Discharge Note   Patient Details  Name: Diana Harper MRN: [id number] Date of Birth: 07/11/1936  Transition of [facility]) CM/SW Contact:  Kermit Balo, RN Phone Number: 10/14/2023, 11:48 AM   Clinical Narrative:     Pt's hospital bed is being delivered to the home today. She will discharge home with home health services through [facility] and palliative with [facility].  Daughter is transporting home.   Final next level of care: Home w Home Health Services Barriers to Discharge: No Barriers Identified   Patient Goals and CMS Choice CMS Medicare.gov Compare Post Acute Care list provided to:: Patient Represenative (must comment) (daughter [name]) Choice offered to / list presented to : Adult Children  Discharge Placement                         Discharge Plan and Services Additional resources added to the After Visit Summary for   In-house Referral: Clinical Social Work Discharge Planning Services: CM Consult Post Acute Care Choice: Durable Medical Equipment, Home Health          DME Arranged: Hospital bed DME Agency: [employer] Date DME Agency Contacted: 10/12/23 Time DME Agency Contacted: 44 Representative spoke with at DME Agency: Mitch HH Arranged: RN, PT, OT HH Agency: [facility] Date [facility] Agency Contacted: 10/12/23 Time HH Agency Contacted: 1601 Representative spoke with at [facility] Agency: Haywood Lasso  Social Determinants of Health (SDOH) Interventions SDOH Screenings   Food Insecurity: No Food Insecurity (10/08/2023)  Housing: Low Risk  (10/08/2023)  Transportation Needs: No Transportation Needs (10/08/2023)  Utilities: Not At Risk (10/08/2023)  Social Connections: Unknown (03/28/2022)   Received from [facility]  Tobacco Use: Low Risk  (10/08/2023)     Readmission Risk Interventions    07/27/2022   10:46 AM  Readmission Risk Prevention Plan  Transportation Screening Complete  PCP or Specialist  Appt within 3-5 Days Complete  HRI or Home Care Consult Complete  Social Work Consult for Recovery Care Planning/Counseling Complete  Palliative Care Screening Not Applicable  Medication Review Oceanographer) Complete

## 2023-10-14 NOTE — Progress Notes (Signed)
Occupational Therapy Treatment Patient Details Name: Diana Harper MRN: [id number] DOB: 12-23-1935 Today's Date: 10/14/2023   History of present illness Patient is a 87 year old female who was admitted with AMS, slurred speech, decreased appetite and weakness.patient was admitted with acute metabolic encephalopathy, dehydration, hypertensive urgency,  CT head: negative for acute changes; MRI + for B basal ganglia CVA with additional pons and R cerebellar lacunar infarcts.  PMH: dementia, CKD III, asthma, hyperlipidemia, HTN, CAD, depression.   OT comments  Total assist to dress in preparation for discharge home. Pt able to stand with daughter's instruction with +2 min assist and transfer to w/c.       If plan is discharge home, recommend the following:  Two people to help with walking and/or transfers;Two people to help with bathing/dressing/bathroom;Assistance with cooking/housework;Direct supervision/assist for medications management;Assist for transportation;Help with stairs or ramp for entrance;Direct supervision/assist for financial management   Equipment Recommendations  Hospital bed    Recommendations for Other Services      Precautions / Restrictions Precautions Precautions: Fall Restrictions Weight Bearing Restrictions: No       Mobility Bed Mobility Overal bed mobility: Needs Assistance Bed Mobility: Rolling, Supine to Sit Rolling: Mod assist   Supine to sit: +2 for physical assistance, Max assist          Transfers Overall transfer level: Needs assistance Equipment used: 2 person hand held assist Transfers: Sit to/from Stand Sit to Stand: +2 physical assistance, Min assist                 Balance Overall balance assessment: Needs assistance Sitting-balance support: Feet supported Sitting balance-Leahy Scale: Fair Sitting balance - Comments: poor, improved to fair   Standing balance support: Bilateral upper extremity supported Standing  balance-Leahy Scale: Poor Standing balance comment: supported with B UEs on window sill                           ADL either performed or assessed with clinical judgement   ADL                                         General ADL Comments: total asst at bed level or sit<>stand for dressing and pericare with, assisted daughter    Extremity/Trunk Assessment              Vision       Perception     Praxis      Cognition Arousal: Alert Behavior During Therapy: Flat affect Overall Cognitive Status: History of cognitive impairments - at baseline                                 General Comments: following commands with increased time        Exercises      Shoulder Instructions       General Comments      Pertinent Vitals/ Pain       Pain Assessment Pain Assessment: Faces Faces Pain Scale: No hurt  Home Living                                          Prior Functioning/Environment  Frequency  Min 1X/week        Progress Toward Goals  OT Goals(current goals can now be found in the care plan section)  Progress towards OT goals: Progressing toward goals  Acute Rehab OT Goals OT Goal Formulation: Patient unable to participate in goal setting Time For Goal Achievement: 10/23/23 Potential to Achieve Goals: Fair  Plan      Co-evaluation                 AM-PAC OT "6 Clicks" Daily Activity     Outcome Measure   Help from another person eating meals?: Total Help from another person taking care of personal grooming?: Total Help from another person toileting, which includes using toliet, bedpan, or urinal?: Total Help from another person bathing (including washing, rinsing, drying)?: Total Help from another person to put on and taking off regular upper body clothing?: Total Help from another person to put on and taking off regular lower body clothing?: Total 6 Click Score:  6    End of Session    OT Visit Diagnosis: Muscle weakness (generalized) (M62.81);Other symptoms and signs involving cognitive function;Unsteadiness on feet (R26.81)   Activity Tolerance Patient tolerated treatment well   Patient Left Other (comment);with family/visitor present;with nursing/sitter in room (in w/c)   Nurse Communication          Time: 1610-9604 OT Time Calculation (min): 28 min  Charges: OT General Charges $OT Visit: 1 Visit OT Treatments $Self Care/Home Management : 23-37 mins  Berna Spare, OTR/L Acute Rehabilitation Services Office: [phone]  Evern Bio 10/14/2023, 3:14 PM

## 2023-10-16 DIAGNOSIS — G9389 Other specified disorders of brain: Secondary | ICD-10-CM | POA: Diagnosis not present

## 2023-10-16 DIAGNOSIS — N179 Acute kidney failure, unspecified: Secondary | ICD-10-CM | POA: Diagnosis not present

## 2023-10-16 DIAGNOSIS — K649 Unspecified hemorrhoids: Secondary | ICD-10-CM | POA: Diagnosis not present

## 2023-10-16 DIAGNOSIS — I2584 Coronary atherosclerosis due to calcified coronary lesion: Secondary | ICD-10-CM | POA: Diagnosis not present

## 2023-10-16 DIAGNOSIS — G959 Disease of spinal cord, unspecified: Secondary | ICD-10-CM | POA: Diagnosis not present

## 2023-10-16 DIAGNOSIS — E876 Hypokalemia: Secondary | ICD-10-CM | POA: Diagnosis not present

## 2023-10-16 DIAGNOSIS — E1165 Type 2 diabetes mellitus with hyperglycemia: Secondary | ICD-10-CM | POA: Diagnosis not present

## 2023-10-16 DIAGNOSIS — F0283 Dementia in other diseases classified elsewhere, unspecified severity, with mood disturbance: Secondary | ICD-10-CM | POA: Diagnosis not present

## 2023-10-16 DIAGNOSIS — G9341 Metabolic encephalopathy: Secondary | ICD-10-CM | POA: Diagnosis not present

## 2023-10-16 DIAGNOSIS — R1310 Dysphagia, unspecified: Secondary | ICD-10-CM | POA: Diagnosis not present

## 2023-10-16 DIAGNOSIS — I129 Hypertensive chronic kidney disease with stage 1 through stage 4 chronic kidney disease, or unspecified chronic kidney disease: Secondary | ICD-10-CM | POA: Diagnosis not present

## 2023-10-16 DIAGNOSIS — K5730 Diverticulosis of large intestine without perforation or abscess without bleeding: Secondary | ICD-10-CM | POA: Diagnosis not present

## 2023-10-16 DIAGNOSIS — E039 Hypothyroidism, unspecified: Secondary | ICD-10-CM | POA: Diagnosis not present

## 2023-10-16 DIAGNOSIS — K259 Gastric ulcer, unspecified as acute or chronic, without hemorrhage or perforation: Secondary | ICD-10-CM | POA: Diagnosis not present

## 2023-10-16 DIAGNOSIS — E860 Dehydration: Secondary | ICD-10-CM | POA: Diagnosis not present

## 2023-10-16 DIAGNOSIS — E1142 Type 2 diabetes mellitus with diabetic polyneuropathy: Secondary | ICD-10-CM | POA: Diagnosis not present

## 2023-10-16 DIAGNOSIS — J4489 Other specified chronic obstructive pulmonary disease: Secondary | ICD-10-CM | POA: Diagnosis not present

## 2023-10-16 DIAGNOSIS — D631 Anemia in chronic kidney disease: Secondary | ICD-10-CM | POA: Diagnosis not present

## 2023-10-16 DIAGNOSIS — M1990 Unspecified osteoarthritis, unspecified site: Secondary | ICD-10-CM | POA: Diagnosis not present

## 2023-10-16 DIAGNOSIS — I2510 Atherosclerotic heart disease of native coronary artery without angina pectoris: Secondary | ICD-10-CM | POA: Diagnosis not present

## 2023-10-16 DIAGNOSIS — J4520 Mild intermittent asthma, uncomplicated: Secondary | ICD-10-CM | POA: Diagnosis not present

## 2023-10-16 DIAGNOSIS — F0284 Dementia in other diseases classified elsewhere, unspecified severity, with anxiety: Secondary | ICD-10-CM | POA: Diagnosis not present

## 2023-10-16 DIAGNOSIS — N184 Chronic kidney disease, stage 4 (severe): Secondary | ICD-10-CM | POA: Diagnosis not present

## 2023-10-16 DIAGNOSIS — E1122 Type 2 diabetes mellitus with diabetic chronic kidney disease: Secondary | ICD-10-CM | POA: Diagnosis not present

## 2023-10-16 DIAGNOSIS — M549 Dorsalgia, unspecified: Secondary | ICD-10-CM | POA: Diagnosis not present

## 2023-10-17 DIAGNOSIS — F0283 Dementia in other diseases classified elsewhere, unspecified severity, with mood disturbance: Secondary | ICD-10-CM | POA: Diagnosis not present

## 2023-10-17 DIAGNOSIS — I2510 Atherosclerotic heart disease of native coronary artery without angina pectoris: Secondary | ICD-10-CM | POA: Diagnosis not present

## 2023-10-17 DIAGNOSIS — Z515 Encounter for palliative care: Secondary | ICD-10-CM | POA: Diagnosis not present

## 2023-10-17 DIAGNOSIS — E1121 Type 2 diabetes mellitus with diabetic nephropathy: Secondary | ICD-10-CM | POA: Diagnosis not present

## 2023-10-17 DIAGNOSIS — E039 Hypothyroidism, unspecified: Secondary | ICD-10-CM | POA: Diagnosis not present

## 2023-10-17 DIAGNOSIS — R4182 Altered mental status, unspecified: Secondary | ICD-10-CM | POA: Diagnosis not present

## 2023-10-17 DIAGNOSIS — G9341 Metabolic encephalopathy: Secondary | ICD-10-CM | POA: Diagnosis not present

## 2023-10-17 DIAGNOSIS — Z9181 History of falling: Secondary | ICD-10-CM | POA: Diagnosis not present

## 2023-10-17 DIAGNOSIS — Z742 Need for assistance at home and no other household member able to render care: Secondary | ICD-10-CM | POA: Diagnosis not present

## 2023-10-17 DIAGNOSIS — I129 Hypertensive chronic kidney disease with stage 1 through stage 4 chronic kidney disease, or unspecified chronic kidney disease: Secondary | ICD-10-CM | POA: Diagnosis not present

## 2023-10-17 DIAGNOSIS — I639 Cerebral infarction, unspecified: Secondary | ICD-10-CM | POA: Diagnosis not present

## 2023-10-17 DIAGNOSIS — F0284 Dementia in other diseases classified elsewhere, unspecified severity, with anxiety: Secondary | ICD-10-CM | POA: Diagnosis not present

## 2023-10-17 DIAGNOSIS — E1122 Type 2 diabetes mellitus with diabetic chronic kidney disease: Secondary | ICD-10-CM | POA: Diagnosis not present

## 2023-10-17 DIAGNOSIS — I10 Essential (primary) hypertension: Secondary | ICD-10-CM | POA: Diagnosis not present

## 2023-10-20 DIAGNOSIS — F0283 Dementia in other diseases classified elsewhere, unspecified severity, with mood disturbance: Secondary | ICD-10-CM | POA: Diagnosis not present

## 2023-10-20 DIAGNOSIS — E1122 Type 2 diabetes mellitus with diabetic chronic kidney disease: Secondary | ICD-10-CM | POA: Diagnosis not present

## 2023-10-20 DIAGNOSIS — E039 Hypothyroidism, unspecified: Secondary | ICD-10-CM | POA: Diagnosis not present

## 2023-10-20 DIAGNOSIS — I129 Hypertensive chronic kidney disease with stage 1 through stage 4 chronic kidney disease, or unspecified chronic kidney disease: Secondary | ICD-10-CM | POA: Diagnosis not present

## 2023-10-20 DIAGNOSIS — G9341 Metabolic encephalopathy: Secondary | ICD-10-CM | POA: Diagnosis not present

## 2023-10-20 DIAGNOSIS — F0284 Dementia in other diseases classified elsewhere, unspecified severity, with anxiety: Secondary | ICD-10-CM | POA: Diagnosis not present

## 2023-10-21 DIAGNOSIS — F0283 Dementia in other diseases classified elsewhere, unspecified severity, with mood disturbance: Secondary | ICD-10-CM | POA: Diagnosis not present

## 2023-10-21 DIAGNOSIS — I129 Hypertensive chronic kidney disease with stage 1 through stage 4 chronic kidney disease, or unspecified chronic kidney disease: Secondary | ICD-10-CM | POA: Diagnosis not present

## 2023-10-21 DIAGNOSIS — G9341 Metabolic encephalopathy: Secondary | ICD-10-CM | POA: Diagnosis not present

## 2023-10-21 DIAGNOSIS — E1122 Type 2 diabetes mellitus with diabetic chronic kidney disease: Secondary | ICD-10-CM | POA: Diagnosis not present

## 2023-10-21 DIAGNOSIS — E039 Hypothyroidism, unspecified: Secondary | ICD-10-CM | POA: Diagnosis not present

## 2023-10-21 DIAGNOSIS — F0284 Dementia in other diseases classified elsewhere, unspecified severity, with anxiety: Secondary | ICD-10-CM | POA: Diagnosis not present

## 2023-10-24 DIAGNOSIS — E1122 Type 2 diabetes mellitus with diabetic chronic kidney disease: Secondary | ICD-10-CM | POA: Diagnosis not present

## 2023-10-24 DIAGNOSIS — G9341 Metabolic encephalopathy: Secondary | ICD-10-CM | POA: Diagnosis not present

## 2023-10-24 DIAGNOSIS — I129 Hypertensive chronic kidney disease with stage 1 through stage 4 chronic kidney disease, or unspecified chronic kidney disease: Secondary | ICD-10-CM | POA: Diagnosis not present

## 2023-10-24 DIAGNOSIS — E039 Hypothyroidism, unspecified: Secondary | ICD-10-CM | POA: Diagnosis not present

## 2023-10-24 DIAGNOSIS — F0284 Dementia in other diseases classified elsewhere, unspecified severity, with anxiety: Secondary | ICD-10-CM | POA: Diagnosis not present

## 2023-10-24 DIAGNOSIS — F0283 Dementia in other diseases classified elsewhere, unspecified severity, with mood disturbance: Secondary | ICD-10-CM | POA: Diagnosis not present

## 2023-10-25 DIAGNOSIS — E1122 Type 2 diabetes mellitus with diabetic chronic kidney disease: Secondary | ICD-10-CM | POA: Diagnosis not present

## 2023-10-25 DIAGNOSIS — I129 Hypertensive chronic kidney disease with stage 1 through stage 4 chronic kidney disease, or unspecified chronic kidney disease: Secondary | ICD-10-CM | POA: Diagnosis not present

## 2023-10-25 DIAGNOSIS — F0283 Dementia in other diseases classified elsewhere, unspecified severity, with mood disturbance: Secondary | ICD-10-CM | POA: Diagnosis not present

## 2023-10-25 DIAGNOSIS — G9341 Metabolic encephalopathy: Secondary | ICD-10-CM | POA: Diagnosis not present

## 2023-10-25 DIAGNOSIS — F0284 Dementia in other diseases classified elsewhere, unspecified severity, with anxiety: Secondary | ICD-10-CM | POA: Diagnosis not present

## 2023-10-25 DIAGNOSIS — E039 Hypothyroidism, unspecified: Secondary | ICD-10-CM | POA: Diagnosis not present

## 2023-10-26 DIAGNOSIS — G9341 Metabolic encephalopathy: Secondary | ICD-10-CM | POA: Diagnosis not present

## 2023-10-26 DIAGNOSIS — I129 Hypertensive chronic kidney disease with stage 1 through stage 4 chronic kidney disease, or unspecified chronic kidney disease: Secondary | ICD-10-CM | POA: Diagnosis not present

## 2023-10-26 DIAGNOSIS — E1122 Type 2 diabetes mellitus with diabetic chronic kidney disease: Secondary | ICD-10-CM | POA: Diagnosis not present

## 2023-10-26 DIAGNOSIS — F0284 Dementia in other diseases classified elsewhere, unspecified severity, with anxiety: Secondary | ICD-10-CM | POA: Diagnosis not present

## 2023-10-26 DIAGNOSIS — E039 Hypothyroidism, unspecified: Secondary | ICD-10-CM | POA: Diagnosis not present

## 2023-10-26 DIAGNOSIS — F0283 Dementia in other diseases classified elsewhere, unspecified severity, with mood disturbance: Secondary | ICD-10-CM | POA: Diagnosis not present

## 2023-10-28 DIAGNOSIS — F0284 Dementia in other diseases classified elsewhere, unspecified severity, with anxiety: Secondary | ICD-10-CM | POA: Diagnosis not present

## 2023-10-28 DIAGNOSIS — G9341 Metabolic encephalopathy: Secondary | ICD-10-CM | POA: Diagnosis not present

## 2023-10-28 DIAGNOSIS — E1122 Type 2 diabetes mellitus with diabetic chronic kidney disease: Secondary | ICD-10-CM | POA: Diagnosis not present

## 2023-10-28 DIAGNOSIS — E039 Hypothyroidism, unspecified: Secondary | ICD-10-CM | POA: Diagnosis not present

## 2023-10-28 DIAGNOSIS — I129 Hypertensive chronic kidney disease with stage 1 through stage 4 chronic kidney disease, or unspecified chronic kidney disease: Secondary | ICD-10-CM | POA: Diagnosis not present

## 2023-10-28 DIAGNOSIS — F0283 Dementia in other diseases classified elsewhere, unspecified severity, with mood disturbance: Secondary | ICD-10-CM | POA: Diagnosis not present

## 2023-10-31 DIAGNOSIS — G9341 Metabolic encephalopathy: Secondary | ICD-10-CM | POA: Diagnosis not present

## 2023-10-31 DIAGNOSIS — F0284 Dementia in other diseases classified elsewhere, unspecified severity, with anxiety: Secondary | ICD-10-CM | POA: Diagnosis not present

## 2023-10-31 DIAGNOSIS — F0283 Dementia in other diseases classified elsewhere, unspecified severity, with mood disturbance: Secondary | ICD-10-CM | POA: Diagnosis not present

## 2023-10-31 DIAGNOSIS — E039 Hypothyroidism, unspecified: Secondary | ICD-10-CM | POA: Diagnosis not present

## 2023-10-31 DIAGNOSIS — E1122 Type 2 diabetes mellitus with diabetic chronic kidney disease: Secondary | ICD-10-CM | POA: Diagnosis not present

## 2023-10-31 DIAGNOSIS — I129 Hypertensive chronic kidney disease with stage 1 through stage 4 chronic kidney disease, or unspecified chronic kidney disease: Secondary | ICD-10-CM | POA: Diagnosis not present

## 2023-11-01 DIAGNOSIS — E1122 Type 2 diabetes mellitus with diabetic chronic kidney disease: Secondary | ICD-10-CM | POA: Diagnosis not present

## 2023-11-01 DIAGNOSIS — F0284 Dementia in other diseases classified elsewhere, unspecified severity, with anxiety: Secondary | ICD-10-CM | POA: Diagnosis not present

## 2023-11-01 DIAGNOSIS — F0283 Dementia in other diseases classified elsewhere, unspecified severity, with mood disturbance: Secondary | ICD-10-CM | POA: Diagnosis not present

## 2023-11-01 DIAGNOSIS — I129 Hypertensive chronic kidney disease with stage 1 through stage 4 chronic kidney disease, or unspecified chronic kidney disease: Secondary | ICD-10-CM | POA: Diagnosis not present

## 2023-11-01 DIAGNOSIS — E039 Hypothyroidism, unspecified: Secondary | ICD-10-CM | POA: Diagnosis not present

## 2023-11-01 DIAGNOSIS — G9341 Metabolic encephalopathy: Secondary | ICD-10-CM | POA: Diagnosis not present

## 2023-11-03 ENCOUNTER — Ambulatory Visit: Payer: Medicare Other | Admitting: Cardiology

## 2023-11-03 DIAGNOSIS — I129 Hypertensive chronic kidney disease with stage 1 through stage 4 chronic kidney disease, or unspecified chronic kidney disease: Secondary | ICD-10-CM | POA: Diagnosis not present

## 2023-11-03 DIAGNOSIS — F0283 Dementia in other diseases classified elsewhere, unspecified severity, with mood disturbance: Secondary | ICD-10-CM | POA: Diagnosis not present

## 2023-11-03 DIAGNOSIS — F0284 Dementia in other diseases classified elsewhere, unspecified severity, with anxiety: Secondary | ICD-10-CM | POA: Diagnosis not present

## 2023-11-03 DIAGNOSIS — E039 Hypothyroidism, unspecified: Secondary | ICD-10-CM | POA: Diagnosis not present

## 2023-11-03 DIAGNOSIS — E1122 Type 2 diabetes mellitus with diabetic chronic kidney disease: Secondary | ICD-10-CM | POA: Diagnosis not present

## 2023-11-03 DIAGNOSIS — G9341 Metabolic encephalopathy: Secondary | ICD-10-CM | POA: Diagnosis not present

## 2023-11-03 NOTE — Progress Notes (Unsigned)
Cardiology Office Note:  .   Date:  11/03/2023  ID:  Diana Harper, DOB 1936/01/12, MRN [id number] PCP: Lewis Moccasin, MD  [facility] Providers Cardiologist:  None { Click to update primary MD,subspecialty MD or APP then REFRESH:1}  History of Present Illness: Diana Harper   Diana Harper is a 87 y.o. African-American female patient with diabetes mellitus with stage IV chronic kidney disease, diabetic polyneuropathy, history of TIA in 2019, admitted to the hospital on 04/18/2023 with acute right caudate head stroke and scattered small foci of acute ischemic stroke within the right MCA territory and also found to have old posterior left parietal lobe infarct by imaging. Patient has generalized weakness, previously was able to walk with help of walker for at least 50 to 60 feet, now is able to walk very minimal, has been getting home PT and OT.  She is pretty much on a wheelchair or on the walker, gets weak walking a few feet.  Discussed the use of AI scribe software for clinical note transcription with the patient, who gave verbal consent to proceed.  History of Present Illness            ROS  Labs   Lab Results  Component Value Date   CHOL 236 (H) 10/09/2023   HDL 28 (L) 10/09/2023   LDLCALC 164 (H) 10/09/2023   TRIG 218 (H) 10/09/2023   CHOLHDL 8.4 10/09/2023   Lab Results  Component Value Date   NA 141 10/14/2023   K 3.7 10/14/2023   CO2 23 10/14/2023   GLUCOSE 127 (H) 10/14/2023   BUN 22 10/14/2023   CREATININE 1.89 (H) 10/14/2023   CALCIUM 9.0 10/14/2023   GFR 23.93 (L) 08/09/2022   GFRNONAA 26 (L) 10/14/2023      Latest Ref Rng & Units 10/14/2023    5:44 AM 10/13/2023   12:33 PM 10/11/2023    5:18 AM  BMP  Glucose 70 - 99 mg/dL 578  469  629   BUN 8 - 23 mg/dL 22  26  23    Creatinine 0.44 - 1.00 mg/dL 5.28  4.13  2.44   Sodium 135 - 145 mmol/L 141  141  140   Potassium 3.5 - 5.1 mmol/L 3.7  3.7  3.7   Chloride 98 - 111 mmol/L 111  110  111   CO2 22 -  32 mmol/L 23  23  19    Calcium 8.9 - 10.3 mg/dL 9.0  9.4  9.0       Latest Ref Rng & Units 10/13/2023   12:33 PM 10/10/2023    9:03 AM 10/08/2023    7:00 PM  CBC  WBC 4.0 - 10.5 K/uL 6.4  6.0  6.3   Hemoglobin 12.0 - 15.0 g/dL 01.0  27.2  53.6   Hematocrit 36.0 - 46.0 % 31.9  33.0  31.3   Platelets 150 - 400 K/uL 248  226  215    Lab Results  Component Value Date   HGBA1C 6.1 (H) 10/08/2023    Lab Results  Component Value Date   TSH 0.792 10/08/2023    Last vitamin D No results found for: "25OHVITD2", "25OHVITD3", "VD25OH"    Physical Exam:   VS:  There were no vitals taken for this visit.   Wt Readings from Last 3 Encounters:  10/08/23 136 lb 11 oz (62 kg)  05/04/23 138 lb (62.6 kg)  04/21/23 143 lb 1.3 oz (64.9 kg)     Physical Exam  Studies Reviewed: .  Carotid artery duplex 04/21/2023: Right Carotid: The extracranial vessels were near-normal with only minimal wall thickening or plaque. Left Carotid: The extracranial vessels were near-normal with only minimal wall thickening or plaque. Vertebrals:  Bilateral vertebral arteries demonstrate antegrade flow. Subclavians: Normal flow hemodynamics were seen in bilateral subclavian arteries.   Echocardiogram 04/18/2023:  1. Left ventricular ejection fraction, by estimation, is 60 to 65%. The left ventricle has normal function. The left ventricle has no regional wall motion abnormalities. Left ventricular diastolic parameters are consistent with Grade I diastolic  dysfunction (impaired relaxation). Elevated left atrial pressure.  2. Right ventricular systolic function is normal. The right ventricular size is normal.  3. The mitral valve is normal in structure. No evidence of mitral valve regurgitation. No evidence of mitral stenosis.  4. The aortic valve is tricuspid. There is moderate calcification of the aortic valve. Aortic valve regurgitation is not visualized. No aortic stenosis is present.  5. The inferior vena cava  is normal in size with greater than 50% respiratory variability, suggesting right atrial pressure of 3 mmHg.  EKG:         EKG 04/18/2023: Normal sinus rhythm at rate of 64 bpm, normal axis, poor R progression, probably normal variant. No evidence of ischemia.   Medications and allergies    Allergies  Allergen Reactions   Latex Anaphylaxis, Shortness Of Breath and Other (See Comments)    Severe respiratory distress   Onion Diarrhea   Other Other (See Comments)    Patient cannot have TREE NUTS and anything with seeds- History of  diverticulitis    Flexeril [Cyclobenzaprine] Other (See Comments)    Caused agitation   Lisinopril Cough   Metformin And Related Diarrhea   Simvastatin Hives and Rash   Tetracycline Rash   Zithromax [Azithromycin] Rash     Current Outpatient Medications:    acetaminophen (TYLENOL) 500 MG tablet, Take 1,000 mg by mouth every 8 (eight) hours as needed for mild pain, headache or fever., Disp: , Rfl:    albuterol (VENTOLIN HFA) 108 (90 Base) MCG/ACT inhaler, Inhale 2 puffs into the lungs every 4 (four) hours as needed for wheezing or shortness of breath., Disp: , Rfl:    amLODipine (NORVASC) 5 MG tablet, Take 1 tablet (5 mg total) by mouth daily., Disp: 30 tablet, Rfl: 0   ARIPiprazole (ABILIFY) 2 MG tablet, Take 2 mg by mouth daily., Disp: , Rfl:    aspirin EC 81 MG tablet, Take 1 tablet (81 mg total) by mouth daily. Swallow whole., Disp: , Rfl:    atorvastatin (LIPITOR) 40 MG tablet, Take 1 tablet (40 mg total) by mouth daily., Disp: 30 tablet, Rfl: 0   carvedilol (COREG) 6.25 MG tablet, Take 1 tablet (6.25 mg total) by mouth 2 (two) times daily with a meal., Disp: 60 tablet, Rfl: 0   Cholecalciferol (VITAMIN D3) 125 MCG (5000 UT) CAPS, Take 5,000 Units by mouth daily., Disp: , Rfl:    Continuous Blood Gluc Receiver (FREESTYLE LIBRE 2 READER) DEVI, See admin instructions., Disp: , Rfl:    Continuous Blood Gluc Sensor (FREESTYLE LIBRE 14 DAY SENSOR) MISC,  Inject 1 Device into the skin every 14 (fourteen) days., Disp: , Rfl:    D-Mannose 500 MG CAPS, Take 500 mg by mouth daily., Disp: , Rfl:    diclofenac Sodium (VOLTAREN) 1 % GEL, Apply 2 g topically 4 (four) times daily as needed (joint pain)., Disp: , Rfl:    Glucagon HCl 1 MG SOLR, Inject 1 mg into  the skin as needed (for a blood sugar <67)., Disp: , Rfl:    insulin degludec (TRESIBA) 200 UNIT/ML FlexTouch Pen, Inject 16 Units into the skin See admin instructions. 50 units SQ once daily if BG is greater than 150, Disp: , Rfl:    L-Theanine 100 MG CAPS, Take 100 mg by mouth daily., Disp: , Rfl:    levothyroxine (SYNTHROID) 88 MCG tablet, Take 88 mcg by mouth daily before breakfast., Disp: , Rfl:    nitroGLYCERIN (NITROSTAT) 0.4 MG SL tablet, Place 1 tablet (0.4 mg total) under the tongue every 5 (five) minutes as needed for chest pain., Disp: 30 tablet, Rfl: 3   NOVOLOG FLEXPEN 100 UNIT/ML FlexPen, Inject 5-10 Units into the skin See admin instructions. Inject 5 units into the skin before meals if BGL is 100-150, 7 units if BGL is 151-200, and 10 units if BGL is greater than 200, Disp: , Rfl:    sertraline (ZOLOFT) 50 MG tablet, Take 150 mg by mouth at bedtime., Disp: , Rfl:    ASSESSMENT AND PLAN: .      ICD-10-CM   1. Cerebrovascular accident (CVA) due to embolism of right middle cerebral artery (HCC)  I63.411     2. Primary hypertension  I10     3. Hypercholesteremia  E78.00       1. Cerebrovascular accident (CVA) due to embolism of right middle cerebral artery (HCC) ***  2. Primary hypertension ***  3. Hypercholesteremia ***  Assessment and Plan           Signed,  Yates Decamp, MD, [facility] 11/03/2023, 1:15 PM [facility] [address] [place], [place] [address] Phone: [phone]. Fax:  [phone]

## 2023-11-04 ENCOUNTER — Encounter: Payer: Self-pay | Admitting: Cardiology

## 2023-11-04 DIAGNOSIS — I129 Hypertensive chronic kidney disease with stage 1 through stage 4 chronic kidney disease, or unspecified chronic kidney disease: Secondary | ICD-10-CM | POA: Diagnosis not present

## 2023-11-04 DIAGNOSIS — N184 Chronic kidney disease, stage 4 (severe): Secondary | ICD-10-CM | POA: Diagnosis not present

## 2023-11-04 DIAGNOSIS — G9341 Metabolic encephalopathy: Secondary | ICD-10-CM | POA: Diagnosis not present

## 2023-11-04 DIAGNOSIS — F0283 Dementia in other diseases classified elsewhere, unspecified severity, with mood disturbance: Secondary | ICD-10-CM | POA: Diagnosis not present

## 2023-11-04 DIAGNOSIS — E1122 Type 2 diabetes mellitus with diabetic chronic kidney disease: Secondary | ICD-10-CM | POA: Diagnosis not present

## 2023-11-04 DIAGNOSIS — F0284 Dementia in other diseases classified elsewhere, unspecified severity, with anxiety: Secondary | ICD-10-CM | POA: Diagnosis not present

## 2023-11-04 DIAGNOSIS — I2510 Atherosclerotic heart disease of native coronary artery without angina pectoris: Secondary | ICD-10-CM | POA: Diagnosis not present

## 2023-11-04 DIAGNOSIS — D631 Anemia in chronic kidney disease: Secondary | ICD-10-CM | POA: Diagnosis not present

## 2023-11-04 DIAGNOSIS — E039 Hypothyroidism, unspecified: Secondary | ICD-10-CM | POA: Diagnosis not present

## 2023-11-04 DIAGNOSIS — Z79899 Other long term (current) drug therapy: Secondary | ICD-10-CM | POA: Diagnosis not present

## 2023-11-04 DIAGNOSIS — I2584 Coronary atherosclerosis due to calcified coronary lesion: Secondary | ICD-10-CM | POA: Diagnosis not present

## 2023-11-04 DIAGNOSIS — M549 Dorsalgia, unspecified: Secondary | ICD-10-CM | POA: Diagnosis not present

## 2023-11-04 DIAGNOSIS — E876 Hypokalemia: Secondary | ICD-10-CM | POA: Diagnosis not present

## 2023-11-04 DIAGNOSIS — N179 Acute kidney failure, unspecified: Secondary | ICD-10-CM | POA: Diagnosis not present

## 2023-11-04 DIAGNOSIS — E1142 Type 2 diabetes mellitus with diabetic polyneuropathy: Secondary | ICD-10-CM | POA: Diagnosis not present

## 2023-11-07 DIAGNOSIS — F0284 Dementia in other diseases classified elsewhere, unspecified severity, with anxiety: Secondary | ICD-10-CM | POA: Diagnosis not present

## 2023-11-07 DIAGNOSIS — E039 Hypothyroidism, unspecified: Secondary | ICD-10-CM | POA: Diagnosis not present

## 2023-11-07 DIAGNOSIS — F0283 Dementia in other diseases classified elsewhere, unspecified severity, with mood disturbance: Secondary | ICD-10-CM | POA: Diagnosis not present

## 2023-11-07 DIAGNOSIS — E1122 Type 2 diabetes mellitus with diabetic chronic kidney disease: Secondary | ICD-10-CM | POA: Diagnosis not present

## 2023-11-07 DIAGNOSIS — I129 Hypertensive chronic kidney disease with stage 1 through stage 4 chronic kidney disease, or unspecified chronic kidney disease: Secondary | ICD-10-CM | POA: Diagnosis not present

## 2023-11-07 DIAGNOSIS — G9341 Metabolic encephalopathy: Secondary | ICD-10-CM | POA: Diagnosis not present

## 2023-11-07 NOTE — Progress Notes (Signed)
I saw Diana Harper in neurology clinic on 11/16/23 in follow up for falls.  HPI: Diana Harper is a 87 y.o. year old female with a history of DM, stroke (2-3 years ago), diverticulitis, HTN, HLD, hypothyroidism, vit D deficiency, and dementia who we last saw on 08/25/22.  To briefly review: 08/25/22: Patient had an episode of not being able to move her left side and not being able to talk 2-3 years ago. She also had numbness on her left. It lasted about 5-10 minutes until she sat down and caught her breath. While things got better, she has had residual left sided symptoms since then. Lifting her left arm or leg is more difficulty than the right. She also has weakness on the right, but just more on the left.    Patient has noticed increased balance difficulty over the last 2 weeks. Per daughter, she was on a decline, but there was a big decline over the last couple of weeks. Patient seems like she is very delayed. She has delayed speech and movement. Per daughter, she has a lot of anxiety about falling. 3 weeks ago she was walking with a cane. Then 2 weeks ago she started needing a walker. Then over the last week she cannot walk unassisted. She has had multiple falls and hit her head at least a couple of times. She has fallen 5 times in the last week and had previously fallen once per week in the two weeks prior.   Daughter mentions patient is not eating. She will think she already eaten even though she has not. She thinks someone refilled her plate. She is losing weight (10 lbs over 3 weeks). She is not drinking water well. She is sleeping a lot as well. She is not cognitively.   She also has a new tremoring that was not present previously.   Of note, patient has been to the ED 3 times in August (admitted twice). Patient had C diff twice per patient. She is currently being treated. Patient endorses bowel and bladder incontinence over the last 3 weeks. Patient does not always take her medications.     Patient gets PT and OT at home. They have also noticed a step decline.   Daughter reports that patient has an MRI on Saturday of her brain.  Most recent Assessment and Plan (08/25/22): Diana Harper is a 87 y.o. female who presents for evaluation of falls, mental status change. She has a relevant medical history of DM, stroke (2-3 years ago), diverticulitis, HTN, HLD, hypothyroidism, vit D deficiency. Her neurological examination is pertinent for drowsiness, slow cognition, generalized weakness perhaps with LLE most weak, and decreased sensation in LLE.    The etiology of patient's symptoms is currently unclear. She has a clear rapid decline for about the last month. Over that period of time, patient has been hospitalized and treated for diverticulitis and C diff (per patient and daughter). Overall, her exam shows deficits that may be similar to prior stroke years ago when she had speech difficulties and left sided weakness and numbness. I think it is unlikely that this represents a new stroke. Given that symptoms are similar to prior stroke, combined with the mental status changes, I am more concerned about recrudescence of old stroke symptoms and delirium in the setting of illness (diverticulitis, C diff, uremia, nutrition concerns or dehydration given that patient is not eating or drinking).    I discussed my concerns with patient and her daughter. I recommended they go  to the ED at [facility] to be evaluated with consideration of admission as patient may not be safe at home as she is not eating or drinking and is frequently falling. Patient and her daughter declined. They preferred to have scheduled MRI brain on Saturday before deciding next steps.   PLAN: -Blood work: B12, B1 -MRI brain wo contrast (may have already been ordered, daughter will let me know) -Advised to go to nearest ED with any new or worsening focal neurologic deficits -Continue asa 81 mg for secondary stroke  prevention  -Return to clinic in 1 month   Since their last visit: B12 was low. I recommended B12 1000 mcg daily. Her B1 was also low. I recommended B1 100 mg daily. Patient is taking B1, but takes a smaller dose of B12 as the pill was too big.  Per my telephone note from 08/31/22: I received patient's MRI brain from [facility] in [place]. I independently looked at the images to confirm the radiology report. There is no new stroke. There are T2 hyperintensities in the white matter, perhaps slightly progressed from MRI brain from 2018, but overall, similar to prior, and consistent with chronic microvascular ischemia.  Patient did not follow up as expected in 1 month.  Over the past year, patient has continued to lose some independence. She has had to be hospitalized at least a couple of times since she was last seen. There is difficulty getting her to take her medications. She takes them some days and some days she does not.  She was admitted recently, from 10/08/23 to 10/14/23 for decreased appetite, weakness, AMS, and slurred speech. Patient had word finding difficulty on 10/10/23, prompting concern for stroke. MRI brain showed acute infarct in right basal ganglia, early subacute infarct in left basal ganglia, and acute to early subacute right thalamic infarct. MRA showed severe focal stenosis of right A1 of ACA, but otherwise okay. Carotid doppler and Echo were normal. LDL was 164, A1c 6.1. Per neurology notes, patient had right gaze preference, left UE neglect, and left facial droop on 10/12/23. The neurology note also mentions that patient had not taken her medications for 2 weeks due to refusing them. Patient was discharged on DAPT (asa 81 mg and Plavix 75 mg) with the plans for 3 weeks of DAPT followed by asa 81 mg monotherapy. Patient is getting home health care currently. She is getting PT (initially twice per week, now once per week), OT, and home health nurse.  Since discharge,  patient continues to not walk as good as prior. She was using a walker unassisted prior to stroke, but since needs at least one person to assist. She has good days and bad days. Her daughter is living with her and is her primary care giver.    MEDICATIONS:  Outpatient Encounter Medications as of 11/16/2023  Medication Sig Note   acetaminophen (TYLENOL) 500 MG tablet Take 1,000 mg by mouth every 8 (eight) hours as needed for mild pain, headache or fever.    albuterol (VENTOLIN HFA) 108 (90 Base) MCG/ACT inhaler Inhale 2 puffs into the lungs every 4 (four) hours as needed for wheezing or shortness of breath.    amLODipine (NORVASC) 5 MG tablet Take 1 tablet (5 mg total) by mouth daily.    ARIPiprazole (ABILIFY) 2 MG tablet Take 2 mg by mouth daily.    aspirin EC 81 MG tablet Take 1 tablet (81 mg total) by mouth daily. Swallow whole. (Patient taking differently: Take 81 mg  by mouth as needed. Swallow whole.)    atorvastatin (LIPITOR) 40 MG tablet Take 1 tablet (40 mg total) by mouth daily.    Cholecalciferol (VITAMIN D3) 125 MCG (5000 UT) CAPS Take 5,000 Units by mouth daily.    Continuous Blood Gluc Receiver (FREESTYLE LIBRE 2 READER) DEVI See admin instructions.    Continuous Blood Gluc Sensor (FREESTYLE LIBRE 14 DAY SENSOR) MISC Inject 1 Device into the skin every 14 (fourteen) days.    diclofenac Sodium (VOLTAREN) 1 % GEL Apply 2 g topically 4 (four) times daily as needed (joint pain).    Glucagon HCl 1 MG SOLR Inject 1 mg into the skin as needed (for a blood sugar <67). 10/08/2023: Active order, no recent need/use per daughter   insulin degludec (TRESIBA) 200 UNIT/ML FlexTouch Pen Inject 16 Units into the skin See admin instructions. 50 units SQ once daily if BG is greater than 150    levothyroxine (SYNTHROID) 88 MCG tablet Take 88 mcg by mouth daily before breakfast.    nitroGLYCERIN (NITROSTAT) 0.4 MG SL tablet Place 1 tablet (0.4 mg total) under the tongue every 5 (five) minutes as needed for  chest pain.    NOVOLOG FLEXPEN 100 UNIT/ML FlexPen Inject 5-10 Units into the skin See admin instructions. Inject 5 units into the skin before meals if BGL is 100-150, 7 units if BGL is 151-200, and 10 units if BGL is greater than 200    sertraline (ZOLOFT) 50 MG tablet Take 50 mg by mouth at bedtime.    [DISCONTINUED] clopidogrel (PLAVIX) 75 MG tablet Take 75 mg by mouth daily.    carvedilol (COREG) 6.25 MG tablet Take 1 tablet (6.25 mg total) by mouth 2 (two) times daily with a meal. (Patient not taking: Reported on 11/16/2023)    D-Mannose 500 MG CAPS Take 500 mg by mouth daily. (Patient not taking: Reported on 11/16/2023)    L-Theanine 100 MG CAPS Take 100 mg by mouth daily. (Patient not taking: Reported on 11/16/2023)    No facility-administered encounter medications on file as of 11/16/2023.    PAST MEDICAL HISTORY: Past Medical History:  Diagnosis Date   Anemia    Anxiety    Arthritis    Asthma    Biliary colic    C. difficile colitis 08/2022   Cholelithiasis    CKD (chronic kidney disease), stage III [facility])    nephrologist--  dr Reynolds Bowl   Colon polyps    Diverticulosis of colon    Gastric ulcer    Glaucoma    History of asthma    1980's  no longer problem since 1980's   History of diverticulitis of colon    recurrent--  2014;  2013;  2012   Hyperlipidemia    Hypertension    Hypothyroidism    Insulin dependent type 2 diabetes mellitus (HCC)    followed by dr Jonny Ruiz lambeth ([facility])    Murmur    Peripheral neuropathy    Stroke [facility])    Vitamin D deficiency    Wears hearing aid    bilateral   Wears partial dentures    lower partial and upper full    PAST SURGICAL HISTORY: Past Surgical History:  Procedure Laterality Date   ABDOMINAL HYSTERECTOMY  1970's   CARDIOVASCULAR STRESS TEST  07/30/2009   normal exercise lexiscan nuclear study w/ no ischemia/  normal LV funciton and wall motion, ef 77%   CATARACT EXTRACTION W/ INTRAOCULAR LENS  IMPLANT, BILATERAL  2014    CATARACT EXTRACTION,  BILATERAL Bilateral 2021   LAPAROSCOPIC CHOLECYSTECTOMY SINGLE SITE WITH INTRAOPERATIVE CHOLANGIOGRAM N/A 08/25/2016   Procedure: LAPAROSCOPIC CHOLECYSTECTOMY WITH INTRAOPERATIVE CHOLANGIOGRAM;  Surgeon: Rodman Pickle, MD;  Location: [facility];  Service: General;  Laterality: N/A;   LUMBAR SPINE SURGERY  2014   REMOVAL AXILLA CYST Right 1990's    ALLERGIES: Allergies  Allergen Reactions   Latex Anaphylaxis, Shortness Of Breath and Other (See Comments)    Severe respiratory distress   Onion Diarrhea   Other Other (See Comments)    Patient cannot have TREE NUTS and anything with seeds- History of  diverticulitis    Flexeril [Cyclobenzaprine] Other (See Comments)    Caused agitation   Lisinopril Cough   Metformin And Related Diarrhea   Simvastatin Hives and Rash   Tetracycline Rash   Zithromax [Azithromycin] Rash    FAMILY HISTORY: Family History  Problem Relation Age of Onset   Stroke Mother    Colon polyps Mother    Diabetes Mother    Diabetes Father    Kidney failure Father    COPD Father    Colon polyps Sister    Diabetes Sister    Stroke Brother    Diabetes Brother    Heart attack Maternal Grandmother    Heart disease Maternal Grandfather    Diabetes Paternal Grandmother    Stroke Paternal Grandfather    Hypertension Paternal Grandfather    Diabetes Sister    Diabetes Sister    Diabetes Son    Diabetes Daughter    Colon cancer Neg Hx    Esophageal cancer Neg Hx    Inflammatory bowel disease Neg Hx    Liver disease Neg Hx    Pancreatic cancer Neg Hx    Rectal cancer Neg Hx    Stomach cancer Neg Hx     SOCIAL HISTORY: Social History   Tobacco Use   Smoking status: Never   Smokeless tobacco: Never  Vaping Use   Vaping status: Never Used  Substance Use Topics   Alcohol use: No   Drug use: No   Social History   Social History Narrative   Are you right handed or left handed? Right   Are you currently  employed ?    What is your current occupation? retired   Do you live at home alone?   Who lives with you? Daughter   What type of home do you live in: 1 story or 2 story? one   No Caffiene      Objective:  Vital Signs:  BP 132/68   Pulse 75   Ht 5\' 6"  (1.676 m)   Wt 132 lb (59.9 kg)   SpO2 98%   BMI 21.31 kg/m   General: General appearance: Awake and alert. No distress. Cooperative with exam.  Skin: No obvious rash or jaundice. HEENT: Atraumatic. Anicteric. Lungs: Non-labored breathing on room air  Heart: Regular Extremities: No edema.  Neurological: Mental Status: Alert. Speech fluent. No pseudobulbar affect Language: minimal spontaneous speech. Able to name and repeat. Cranial Nerves: CNII: No RAPD. Visual fields appear to be intact. CNIII, IV, VI: PERRL. No nystagmus. EOMI. She has a mild right gaze preference. CN V: Facial sensation intact bilaterally to fine touch. CN VII: Facial muscles symmetric and strong. No obvious face droop today. No ptosis at rest CN VIII: Hears finger rub well bilaterally. CN IX: No hypophonia. CN X: Palate elevates symmetrically. CN XI: Full strength shoulder shrug bilaterally. CN XII: Tongue protrusion full and midline. No  atrophy or fasciculations. Mild dysarthria. Motor: Tone: paratonia and difficulty relaxing as instructed in all extremities. Individual muscle group testing is difficult, but at least 4+/5 in bilateral upper extremities and at least 4-/5 in bilateral lower extremities. Reflexes: present and symmetric Pathological Reflexes: Babinski: flexor response bilaterally Hoffman: absent bilaterally Troemner: absent bilaterally Sensation: Withdrawal from noxious stimulation in all extremities equally. Coordination: Unable to test due to poor cooperation Gait: Unable to rise from chair with arms crossed unassisted. Requires full assistance (picking her up) to stand. She requires two person assist to stand or attempt to walk.  She was unable to safely ambulate in clinic today.   Lab and Test Review: New results: Lipid panel (10/09/23): tChol 236, LDL 164, TG 218 HbA1c (10/08/23): 6.1 TSH (10/08/23): wnl  MRI brain wo contrast (10/10/23): FINDINGS: Brain: Generalized age-related cerebral atrophy. Extensive T2/FLAIR hyperintensity involving the periventricular and deep white matter of both cerebral hemispheres, consistent with advanced chronic microvascular ischemic disease. Patchy FLAIR signal abnormality involving the bilateral parieto-occipital regions noted, stable from prior MRI, and presumably related to chronic microvascular ischemic changes as well. Few remote lacunar infarcts present about the bilateral basal ganglia, pons, and right cerebellum.   1.6 cm acute ischemic infarct present at the right basal ganglia (series 5, image 31). Additional 1.1 cm acute to early subacute ischemic infarct present at the contralateral left basal ganglia (series 5, image 29). Few punctate foci of diffusion signal abnormality within the right thalamus, also consistent with acute to early subacute ischemic changes (series 5, image 26). No associated hemorrhage or mass effect.   No other evidence for acute or subacute ischemia. No acute intracranial hemorrhage. Multiple scattered chronic micro hemorrhages noted, most pronounced at the left aspect of the splenium, likely hypertensive in nature.   No mass lesion, midline shift or mass effect. No hydrocephalus or extra-axial fluid collection. Pituitary gland suprasellar region within normal limits.   Vascular: Major intracranial vascular flow voids are maintained.   Skull and upper cervical spine: Craniocervical junction within normal limits. Moderate to advanced multilevel spondylosis noted within the upper cervical spine, most pronounced at C3-4 where there is mild-to-moderate spinal stenosis. Bone marrow signal intensity within normal limits. No scalp soft  tissue abnormality.   Sinuses/Orbits: Prior bilateral ocular lens replacement. Paranasal sinuses are largely clear. No significant mastoid effusion.   Other: None.   IMPRESSION: 1. 1.6 cm acute ischemic nonhemorrhagic right basal ganglia infarct. 2. 1.1 cm acute to early subacute ischemic nonhemorrhagic left basal ganglia infarct. 3. Additional punctate acute to early subacute ischemic nonhemorrhagic right thalamic infarcts. 4. Underlying age-related cerebral atrophy with advanced chronic microvascular ischemic disease, with a few remote lacunar infarcts involving the bilateral basal ganglia, pons, and right cerebellum.  MRA head wo contrast (10/11/23): FINDINGS: Anterior circulation: Examination mildly degraded by motion artifact.   Both internal carotid arteries are patent through the siphon to the termini without significant stenosis or other abnormality. Left A1 segment dominant and patent. Focal severe stenosis at the mid right A1 segment (series 352, image 1). Normal anterior communicating artery complex. Partially visualized anterior cerebral arteries grossly patent without visible stenosis. No M1 stenosis or occlusion. No visible proximal MCA branch occlusion or high-grade stenosis. Distal MCA branches grossly perfused and symmetric.   Posterior circulation: Both vertebral arteries patent to the vertebrobasilar junction without stenosis. Left vertebral artery dominant. Both PICA grossly patent at their origins. Basilar patent without stenosis. Superior cerebral arteries patent bilaterally. Both PCAs supplied via the basilar. Visualized PCAs  are grossly patent to their distal aspects without visible stenosis.   Anatomic variants: As above.   Other: No intracranial aneurysm.   IMPRESSION: 1. Negative intracranial MRA for large vessel occlusion. 2. Focal severe stenosis at the mid right A1 segment. 3. Otherwise negative intracranial MRA.  Carotid US  (10/12/23): Summary:  Right Carotid: Velocities in the right ICA are consistent with a 1-39%  stenosis.                The ECA appears <50% stenosed.   Left Carotid: Velocities in the left ICA are consistent with a 1-39%  stenosis.               The ECA appears <50% stenosed.   Vertebrals:  Bilateral vertebral arteries demonstrate antegrade flow.  Subclavians: Normal flow hemodynamics were seen in bilateral subclavian               arteries.   Echocardiogram (10/12/23):  1. Left ventricular ejection fraction, by estimation, is 55 to 60%. The  left ventricle has normal function. The left ventricle has no regional  wall motion abnormalities. Left ventricular diastolic parameters are  consistent with Grade I diastolic  dysfunction (impaired relaxation).   2. Right ventricular systolic function is normal. The right ventricular  size is mildly enlarged.   3. Left atrial size was mildly dilated.   4. A small pericardial effusion is present. The pericardial effusion is  posterior to the left ventricle.   5. The mitral valve is grossly normal. No evidence of mitral valve  regurgitation. No evidence of mitral stenosis.   6. The aortic valve is calcified. There is moderate calcification of the  aortic valve. There is moderate thickening of the aortic valve. Aortic  valve regurgitation is not visualized. Aortic valve  sclerosis/calcification is present, without any evidence  of aortic stenosis.   Conclusion(s)/Recommendation(s): No intracardiac source of embolism  detected on this transthoracic study. Consider a transesophageal  echocardiogram to exclude cardiac source of embolism if clinically  indicated.   Previously reviewed results: Normal or unremarkable: TSH (02/04/21) CMP significant for elevated glucose (127), Cr (1.91), and low albumin (3.3) CBC significant for anemia (Hb 10.3) HbA1c (07/15/22): 7.1; 13.2 ~2 years ago   CT head wo contrast (06/02/22): FINDINGS: Brain: There is  no mass, hemorrhage or extra-axial collection. The size and configuration of the ventricles and extra-axial CSF spaces are normal. There is hypoattenuation of the white matter, most commonly indicating chronic small vessel disease.   Vascular: No abnormal hyperdensity of the major intracranial arteries or dural venous sinuses. No intracranial atherosclerosis.   Skull: The visualized skull base, calvarium and extracranial soft tissues are normal.   Sinuses/Orbits: No fluid levels or advanced mucosal thickening of the visualized paranasal sinuses. No mastoid or middle ear effusion. The orbits are normal.   IMPRESSION: Chronic small vessel disease without acute intracranial abnormality.   MRI brain, MRA head wo contrast (10/10/2017): FINDINGS: MRI HEAD FINDINGS   Brain: The midline structures are normal. No focal diffusion restriction to indicate acute infarct. No intraparenchymal hemorrhage. There is beginning confluent hyperintense T2-weighted signal within the periventricular and deep white matter, most often seen in the setting of chronic microvascular ischemia. No mass lesion. No chronic microhemorrhage or cerebral amyloid angiopathy. No hydrocephalus, age advanced atrophy or lobar predominant volume loss. No dural abnormality or extra-axial collection.   Skull and upper cervical spine: The visualized skull base, calvarium, upper cervical spine and extracranial soft tissues are normal.  Sinuses/Orbits: No fluid levels or advanced mucosal thickening. No mastoid effusion. Normal orbits.   MRA HEAD FINDINGS   Intracranial internal carotid arteries: Normal.   Anterior cerebral arteries: Normal.   Middle cerebral arteries: Normal.   Posterior communicating arteries: Present bilaterally.   Posterior cerebral arteries: Normal.   Basilar artery: Normal.   Vertebral arteries: Left dominant. Normal.   Superior cerebellar arteries: Normal.   Anterior inferior  cerebellar arteries: Not clearly visible.   Posterior inferior cerebellar arteries: Normal.   IMPRESSION: 1. Chronic ischemic microangiopathy without acute intracranial abnormality. 2. No emergent large vessel occlusion or high-grade intracranial stenosis.  ASSESSMENT: This is Diana Harper, a 87 y.o. female with a history of dementia, HTN, HLD, DM, CAD, CKD, and recent stroke in bilateral basal ganglia and significant chronic microvascular ischemia. The etiology of recent strokes is most likely small vessel disease, particularly in the setting of medication non-adherence, likely due to dementia. She has good social support with multiple daughters who help with care and home health care, however, her cognitive state prevents medications from being taken as prescribed. While there is the possibility of some recovery and improvement from current state, the prognosis is guarded due to those factors.  Plan: -Aspirin 81 mg every day -Lipitor 40 mg every day -Discussed the importance of good HTN, HLD, and DM control -Discussed stroke warning signs -Continue home PT/OT/speech therapy  Return to clinic in 3-4 months  Total time spent reviewing records, interview, history/exam, documentation, and coordination of care on day of encounter:  70 min  Jacquelyne Balint, MD

## 2023-11-08 DIAGNOSIS — Z789 Other specified health status: Secondary | ICD-10-CM | POA: Diagnosis not present

## 2023-11-08 DIAGNOSIS — Z1331 Encounter for screening for depression: Secondary | ICD-10-CM | POA: Diagnosis not present

## 2023-11-08 DIAGNOSIS — Z9181 History of falling: Secondary | ICD-10-CM | POA: Diagnosis not present

## 2023-11-08 DIAGNOSIS — E1165 Type 2 diabetes mellitus with hyperglycemia: Secondary | ICD-10-CM | POA: Diagnosis not present

## 2023-11-08 DIAGNOSIS — E119 Type 2 diabetes mellitus without complications: Secondary | ICD-10-CM | POA: Diagnosis not present

## 2023-11-08 DIAGNOSIS — Z0000 Encounter for general adult medical examination without abnormal findings: Secondary | ICD-10-CM | POA: Diagnosis not present

## 2023-11-08 DIAGNOSIS — Z1339 Encounter for screening examination for other mental health and behavioral disorders: Secondary | ICD-10-CM | POA: Diagnosis not present

## 2023-11-08 DIAGNOSIS — F0390 Unspecified dementia without behavioral disturbance: Secondary | ICD-10-CM | POA: Diagnosis not present

## 2023-11-08 DIAGNOSIS — Z7409 Other reduced mobility: Secondary | ICD-10-CM | POA: Diagnosis not present

## 2023-11-09 DIAGNOSIS — F0284 Dementia in other diseases classified elsewhere, unspecified severity, with anxiety: Secondary | ICD-10-CM | POA: Diagnosis not present

## 2023-11-09 DIAGNOSIS — E1122 Type 2 diabetes mellitus with diabetic chronic kidney disease: Secondary | ICD-10-CM | POA: Diagnosis not present

## 2023-11-09 DIAGNOSIS — F0283 Dementia in other diseases classified elsewhere, unspecified severity, with mood disturbance: Secondary | ICD-10-CM | POA: Diagnosis not present

## 2023-11-09 DIAGNOSIS — E039 Hypothyroidism, unspecified: Secondary | ICD-10-CM | POA: Diagnosis not present

## 2023-11-09 DIAGNOSIS — I129 Hypertensive chronic kidney disease with stage 1 through stage 4 chronic kidney disease, or unspecified chronic kidney disease: Secondary | ICD-10-CM | POA: Diagnosis not present

## 2023-11-09 DIAGNOSIS — G9341 Metabolic encephalopathy: Secondary | ICD-10-CM | POA: Diagnosis not present

## 2023-11-10 DIAGNOSIS — E1122 Type 2 diabetes mellitus with diabetic chronic kidney disease: Secondary | ICD-10-CM | POA: Diagnosis not present

## 2023-11-10 DIAGNOSIS — G9341 Metabolic encephalopathy: Secondary | ICD-10-CM | POA: Diagnosis not present

## 2023-11-10 DIAGNOSIS — F0283 Dementia in other diseases classified elsewhere, unspecified severity, with mood disturbance: Secondary | ICD-10-CM | POA: Diagnosis not present

## 2023-11-10 DIAGNOSIS — I129 Hypertensive chronic kidney disease with stage 1 through stage 4 chronic kidney disease, or unspecified chronic kidney disease: Secondary | ICD-10-CM | POA: Diagnosis not present

## 2023-11-10 DIAGNOSIS — E039 Hypothyroidism, unspecified: Secondary | ICD-10-CM | POA: Diagnosis not present

## 2023-11-10 DIAGNOSIS — F0284 Dementia in other diseases classified elsewhere, unspecified severity, with anxiety: Secondary | ICD-10-CM | POA: Diagnosis not present

## 2023-11-14 DIAGNOSIS — G9341 Metabolic encephalopathy: Secondary | ICD-10-CM | POA: Diagnosis not present

## 2023-11-14 DIAGNOSIS — E1122 Type 2 diabetes mellitus with diabetic chronic kidney disease: Secondary | ICD-10-CM | POA: Diagnosis not present

## 2023-11-14 DIAGNOSIS — I129 Hypertensive chronic kidney disease with stage 1 through stage 4 chronic kidney disease, or unspecified chronic kidney disease: Secondary | ICD-10-CM | POA: Diagnosis not present

## 2023-11-14 DIAGNOSIS — E039 Hypothyroidism, unspecified: Secondary | ICD-10-CM | POA: Diagnosis not present

## 2023-11-14 DIAGNOSIS — F0284 Dementia in other diseases classified elsewhere, unspecified severity, with anxiety: Secondary | ICD-10-CM | POA: Diagnosis not present

## 2023-11-14 DIAGNOSIS — F0283 Dementia in other diseases classified elsewhere, unspecified severity, with mood disturbance: Secondary | ICD-10-CM | POA: Diagnosis not present

## 2023-11-15 DIAGNOSIS — I2584 Coronary atherosclerosis due to calcified coronary lesion: Secondary | ICD-10-CM | POA: Diagnosis not present

## 2023-11-15 DIAGNOSIS — I2510 Atherosclerotic heart disease of native coronary artery without angina pectoris: Secondary | ICD-10-CM | POA: Diagnosis not present

## 2023-11-15 DIAGNOSIS — I129 Hypertensive chronic kidney disease with stage 1 through stage 4 chronic kidney disease, or unspecified chronic kidney disease: Secondary | ICD-10-CM | POA: Diagnosis not present

## 2023-11-15 DIAGNOSIS — E1142 Type 2 diabetes mellitus with diabetic polyneuropathy: Secondary | ICD-10-CM | POA: Diagnosis not present

## 2023-11-15 DIAGNOSIS — F0284 Dementia in other diseases classified elsewhere, unspecified severity, with anxiety: Secondary | ICD-10-CM | POA: Diagnosis not present

## 2023-11-15 DIAGNOSIS — R1310 Dysphagia, unspecified: Secondary | ICD-10-CM | POA: Diagnosis not present

## 2023-11-15 DIAGNOSIS — K649 Unspecified hemorrhoids: Secondary | ICD-10-CM | POA: Diagnosis not present

## 2023-11-15 DIAGNOSIS — G9341 Metabolic encephalopathy: Secondary | ICD-10-CM | POA: Diagnosis not present

## 2023-11-15 DIAGNOSIS — K259 Gastric ulcer, unspecified as acute or chronic, without hemorrhage or perforation: Secondary | ICD-10-CM | POA: Diagnosis not present

## 2023-11-15 DIAGNOSIS — G959 Disease of spinal cord, unspecified: Secondary | ICD-10-CM | POA: Diagnosis not present

## 2023-11-15 DIAGNOSIS — M549 Dorsalgia, unspecified: Secondary | ICD-10-CM | POA: Diagnosis not present

## 2023-11-15 DIAGNOSIS — E860 Dehydration: Secondary | ICD-10-CM | POA: Diagnosis not present

## 2023-11-15 DIAGNOSIS — D631 Anemia in chronic kidney disease: Secondary | ICD-10-CM | POA: Diagnosis not present

## 2023-11-15 DIAGNOSIS — N184 Chronic kidney disease, stage 4 (severe): Secondary | ICD-10-CM | POA: Diagnosis not present

## 2023-11-15 DIAGNOSIS — J4520 Mild intermittent asthma, uncomplicated: Secondary | ICD-10-CM | POA: Diagnosis not present

## 2023-11-15 DIAGNOSIS — M1990 Unspecified osteoarthritis, unspecified site: Secondary | ICD-10-CM | POA: Diagnosis not present

## 2023-11-15 DIAGNOSIS — K5730 Diverticulosis of large intestine without perforation or abscess without bleeding: Secondary | ICD-10-CM | POA: Diagnosis not present

## 2023-11-15 DIAGNOSIS — J4489 Other specified chronic obstructive pulmonary disease: Secondary | ICD-10-CM | POA: Diagnosis not present

## 2023-11-15 DIAGNOSIS — E039 Hypothyroidism, unspecified: Secondary | ICD-10-CM | POA: Diagnosis not present

## 2023-11-15 DIAGNOSIS — F0283 Dementia in other diseases classified elsewhere, unspecified severity, with mood disturbance: Secondary | ICD-10-CM | POA: Diagnosis not present

## 2023-11-15 DIAGNOSIS — E1165 Type 2 diabetes mellitus with hyperglycemia: Secondary | ICD-10-CM | POA: Diagnosis not present

## 2023-11-15 DIAGNOSIS — N179 Acute kidney failure, unspecified: Secondary | ICD-10-CM | POA: Diagnosis not present

## 2023-11-15 DIAGNOSIS — E876 Hypokalemia: Secondary | ICD-10-CM | POA: Diagnosis not present

## 2023-11-15 DIAGNOSIS — G9389 Other specified disorders of brain: Secondary | ICD-10-CM | POA: Diagnosis not present

## 2023-11-15 DIAGNOSIS — E1122 Type 2 diabetes mellitus with diabetic chronic kidney disease: Secondary | ICD-10-CM | POA: Diagnosis not present

## 2023-11-16 ENCOUNTER — Encounter: Payer: Self-pay | Admitting: Neurology

## 2023-11-16 ENCOUNTER — Ambulatory Visit (INDEPENDENT_AMBULATORY_CARE_PROVIDER_SITE_OTHER): Payer: Medicare Other | Admitting: Neurology

## 2023-11-16 VITALS — BP 132/68 | HR 75 | Ht 66.0 in | Wt 132.0 lb

## 2023-11-16 DIAGNOSIS — R296 Repeated falls: Secondary | ICD-10-CM

## 2023-11-16 DIAGNOSIS — I69398 Other sequelae of cerebral infarction: Secondary | ICD-10-CM | POA: Diagnosis not present

## 2023-11-16 DIAGNOSIS — R269 Unspecified abnormalities of gait and mobility: Secondary | ICD-10-CM

## 2023-11-16 DIAGNOSIS — F0390 Unspecified dementia without behavioral disturbance: Secondary | ICD-10-CM | POA: Diagnosis not present

## 2023-11-16 DIAGNOSIS — R29818 Other symptoms and signs involving the nervous system: Secondary | ICD-10-CM

## 2023-11-16 DIAGNOSIS — R410 Disorientation, unspecified: Secondary | ICD-10-CM

## 2023-11-16 DIAGNOSIS — I6350 Cerebral infarction due to unspecified occlusion or stenosis of unspecified cerebral artery: Secondary | ICD-10-CM

## 2023-11-16 NOTE — Patient Instructions (Signed)
I saw Diana Harper today after the recent stroke.  She should take the aspirin 81 mg every day and Lipitor 40 mg daily for cholesterol.  If you have tablets, it is okay to crush and put in pudding or applesauce.  Continue therapy as she may see some recovery after her stroke.  If she has any new difficulty speaking, face droop, numbness on one side of the body, weakness on one side of the body, or dizziness/imbalance, this could be the sign of a stroke. Don't wait, please call EMS and be evaluated at the nearest emergency room.   I will see her back in clinic in about 3-4 months. Please let me know if you have any questions or concerns in the meantime.   The physicians and staff at [facility] are committed to providing excellent care. You may receive a survey requesting feedback about your experience at our office. We strive to receive "very good" responses to the survey questions. If you feel that your experience would prevent you from giving the office a "very good " response, please contact our office to try to remedy the situation. We may be reached at [phone]. Thank you for taking the time out of your busy day to complete the survey.  Jacquelyne Balint, MD [facility]  Preventing Falls at [facility] are common, often dreaded events in the lives of older people. Aside from the obvious injuries and even death that may result, fall can cause wide-ranging consequences including loss of independence, mental decline, decreased activity and mobility. Younger people are also at risk of falling, especially those with chronic illnesses and fatigue.  Ways to reduce risk for falling Examine diet and medications. Warm foods and alcohol dilate blood vessels, which can lead to dizziness when standing. Sleep aids, antidepressants and pain medications can also increase the likelihood of a fall.  Get a vision exam. Poor vision, cataracts and glaucoma increase the chances of falling.  Check  foot gear. Shoes should fit snugly and have a sturdy, nonskid sole and a broad, low heel  Participate in a physician-approved exercise program to build and maintain muscle strength and improve balance and coordination. Programs that use ankle weights or stretch bands are excellent for muscle-strengthening. Water aerobics programs and low-impact Tai Chi programs have also been shown to improve balance and coordination.  Increase vitamin D intake. Vitamin D improves muscle strength and increases the amount of calcium the body is able to absorb and deposit in bones.  How to prevent falls from common hazards Floors - Remove all loose wires, cords, and throw rugs. Minimize clutter. Make sure rugs are anchored and smooth. Keep furniture in its usual place.  Chairs -- Use chairs with straight backs, armrests and firm seats. Add firm cushions to existing pieces to add height.  Bathroom - Install grab bars and non-skid tape in the tub or shower. Use a bathtub transfer bench or a shower chair with a back support Use an elevated toilet seat and/or safety rails to assist standing from a low surface. Do not use towel racks or bathroom tissue holders to help you stand.  Lighting - Make sure halls, stairways, and entrances are well-lit. Install a night light in your bathroom or hallway. Make sure there is a light switch at the top and bottom of the staircase. Turn lights on if you get up in the middle of the night. Make sure lamps or light switches are within reach of the bed if you have  to get up during the night.  Kitchen - Install non-skid rubber mats near the sink and stove. Clean spills immediately. Store frequently used utensils, pots, pans between waist and eye level. This helps prevent reaching and bending. Sit when getting things out of lower cupboards.  Living room/ Bedrooms - Place furniture with wide spaces in between, giving enough room to move around. Establish a route through the living room that  gives you something to hold onto as you walk.  Stairs - Make sure treads, rails, and rugs are secure. Install a rail on both sides of the stairs. If stairs are a threat, it might be helpful to arrange most of your activities on the lower level to reduce the number of times you must climb the stairs.  Entrances and doorways - Install metal handles on the walls adjacent to the doorknobs of all doors to make it more secure as you travel through the doorway.  Tips for maintaining balance Keep at least one hand free at all times. Try using a backpack or fanny pack to hold things rather than carrying them in your hands. Never carry objects in both hands when walking as this interferes with keeping your balance.  Attempt to swing both arms from front to back while walking. This might require a conscious effort if Parkinson's disease has diminished your movement. It will, however, help you to maintain balance and posture, and reduce fatigue.  Consciously lift your feet off of the ground when walking. Shuffling and dragging of the feet is a common culprit in losing your balance.  When trying to navigate turns, use a "U" technique of facing forward and making a wide turn, rather than pivoting sharply.  Try to stand with your feet shoulder-length apart. When your feet are close together for any length of time, you increase your risk of losing your balance and falling.  Do one thing at a time. Don't try to walk and accomplish another task, such as reading or looking around. The decrease in your automatic reflexes complicates motor function, so the less distraction, the better.  Do not wear rubber or gripping soled shoes, they might "catch" on the floor and cause tripping.  Move slowly when changing positions. Use deliberate, concentrated movements and, if needed, use a grab bar or walking aid. Count 15 seconds between each movement. For example, when rising from a seated position, wait 15 seconds after  standing to begin walking.  If balance is a continuous problem, you might want to consider a walking aid such as a cane, walking stick, or walker. Once you've mastered walking with help, you might be ready to try it on your own again.

## 2023-11-17 ENCOUNTER — Telehealth: Payer: Self-pay | Admitting: Neurology

## 2023-11-17 ENCOUNTER — Telehealth: Payer: Self-pay | Admitting: Cardiology

## 2023-11-17 DIAGNOSIS — E039 Hypothyroidism, unspecified: Secondary | ICD-10-CM | POA: Diagnosis not present

## 2023-11-17 DIAGNOSIS — F0284 Dementia in other diseases classified elsewhere, unspecified severity, with anxiety: Secondary | ICD-10-CM | POA: Diagnosis not present

## 2023-11-17 DIAGNOSIS — G9341 Metabolic encephalopathy: Secondary | ICD-10-CM | POA: Diagnosis not present

## 2023-11-17 DIAGNOSIS — E1122 Type 2 diabetes mellitus with diabetic chronic kidney disease: Secondary | ICD-10-CM | POA: Diagnosis not present

## 2023-11-17 DIAGNOSIS — F0283 Dementia in other diseases classified elsewhere, unspecified severity, with mood disturbance: Secondary | ICD-10-CM | POA: Diagnosis not present

## 2023-11-17 DIAGNOSIS — I129 Hypertensive chronic kidney disease with stage 1 through stage 4 chronic kidney disease, or unspecified chronic kidney disease: Secondary | ICD-10-CM | POA: Diagnosis not present

## 2023-11-17 NOTE — Telephone Encounter (Signed)
Daughter called to say that she had cancel patient appt VIA test, she states that patient was admitted in the hospital and was unable to make appt. Daughter is asking for a call back to get the no show fee remove for 11/03/23. Please advise

## 2023-11-17 NOTE — Telephone Encounter (Signed)
Pt's daughter called in wanting to double check that the pt is supposed to start taking her flastor again? The pt's nurse evaluator is there now and they need to know if they need to put that in her chart

## 2023-11-17 NOTE — Telephone Encounter (Signed)
Called pt Daughter and reported Dr. Loleta Chance verbal answer, that she can take the Florastor if her wants it. Daughter said she will not take it .

## 2023-11-18 DIAGNOSIS — E1122 Type 2 diabetes mellitus with diabetic chronic kidney disease: Secondary | ICD-10-CM | POA: Diagnosis not present

## 2023-11-18 DIAGNOSIS — I129 Hypertensive chronic kidney disease with stage 1 through stage 4 chronic kidney disease, or unspecified chronic kidney disease: Secondary | ICD-10-CM | POA: Diagnosis not present

## 2023-11-18 DIAGNOSIS — F0283 Dementia in other diseases classified elsewhere, unspecified severity, with mood disturbance: Secondary | ICD-10-CM | POA: Diagnosis not present

## 2023-11-18 DIAGNOSIS — F0284 Dementia in other diseases classified elsewhere, unspecified severity, with anxiety: Secondary | ICD-10-CM | POA: Diagnosis not present

## 2023-11-18 DIAGNOSIS — G9341 Metabolic encephalopathy: Secondary | ICD-10-CM | POA: Diagnosis not present

## 2023-11-18 DIAGNOSIS — E039 Hypothyroidism, unspecified: Secondary | ICD-10-CM | POA: Diagnosis not present

## 2023-11-19 ENCOUNTER — Other Ambulatory Visit (HOSPITAL_COMMUNITY): Payer: Self-pay

## 2023-11-21 DIAGNOSIS — G9341 Metabolic encephalopathy: Secondary | ICD-10-CM | POA: Diagnosis not present

## 2023-11-21 DIAGNOSIS — I129 Hypertensive chronic kidney disease with stage 1 through stage 4 chronic kidney disease, or unspecified chronic kidney disease: Secondary | ICD-10-CM | POA: Diagnosis not present

## 2023-11-21 DIAGNOSIS — F0283 Dementia in other diseases classified elsewhere, unspecified severity, with mood disturbance: Secondary | ICD-10-CM | POA: Diagnosis not present

## 2023-11-21 DIAGNOSIS — E039 Hypothyroidism, unspecified: Secondary | ICD-10-CM | POA: Diagnosis not present

## 2023-11-21 DIAGNOSIS — E1122 Type 2 diabetes mellitus with diabetic chronic kidney disease: Secondary | ICD-10-CM | POA: Diagnosis not present

## 2023-11-21 DIAGNOSIS — F0284 Dementia in other diseases classified elsewhere, unspecified severity, with anxiety: Secondary | ICD-10-CM | POA: Diagnosis not present

## 2023-11-22 NOTE — Telephone Encounter (Signed)
Daughter contacted by billing staff Toney Sang) & advised that no show fee will not be billed and no show status corrected to cancelled d/t to patient being hospitalized.

## 2023-11-24 DIAGNOSIS — G9341 Metabolic encephalopathy: Secondary | ICD-10-CM | POA: Diagnosis not present

## 2023-11-24 DIAGNOSIS — E1122 Type 2 diabetes mellitus with diabetic chronic kidney disease: Secondary | ICD-10-CM | POA: Diagnosis not present

## 2023-11-24 DIAGNOSIS — F0284 Dementia in other diseases classified elsewhere, unspecified severity, with anxiety: Secondary | ICD-10-CM | POA: Diagnosis not present

## 2023-11-24 DIAGNOSIS — I129 Hypertensive chronic kidney disease with stage 1 through stage 4 chronic kidney disease, or unspecified chronic kidney disease: Secondary | ICD-10-CM | POA: Diagnosis not present

## 2023-11-24 DIAGNOSIS — E039 Hypothyroidism, unspecified: Secondary | ICD-10-CM | POA: Diagnosis not present

## 2023-11-24 DIAGNOSIS — F0283 Dementia in other diseases classified elsewhere, unspecified severity, with mood disturbance: Secondary | ICD-10-CM | POA: Diagnosis not present

## 2023-11-28 DIAGNOSIS — E1122 Type 2 diabetes mellitus with diabetic chronic kidney disease: Secondary | ICD-10-CM | POA: Diagnosis not present

## 2023-11-28 DIAGNOSIS — G9341 Metabolic encephalopathy: Secondary | ICD-10-CM | POA: Diagnosis not present

## 2023-11-28 DIAGNOSIS — F0283 Dementia in other diseases classified elsewhere, unspecified severity, with mood disturbance: Secondary | ICD-10-CM | POA: Diagnosis not present

## 2023-11-28 DIAGNOSIS — E039 Hypothyroidism, unspecified: Secondary | ICD-10-CM | POA: Diagnosis not present

## 2023-11-28 DIAGNOSIS — F0284 Dementia in other diseases classified elsewhere, unspecified severity, with anxiety: Secondary | ICD-10-CM | POA: Diagnosis not present

## 2023-11-28 DIAGNOSIS — I129 Hypertensive chronic kidney disease with stage 1 through stage 4 chronic kidney disease, or unspecified chronic kidney disease: Secondary | ICD-10-CM | POA: Diagnosis not present

## 2023-11-30 ENCOUNTER — Other Ambulatory Visit: Payer: Self-pay

## 2023-11-30 ENCOUNTER — Inpatient Hospital Stay (HOSPITAL_COMMUNITY)
Admission: EM | Admit: 2023-11-30 | Discharge: 2023-12-02 | DRG: 305 | Disposition: A | Discharge status: Skilled Nursing Facility | Payer: Medicare Other | Attending: Internal Medicine | Admitting: Internal Medicine

## 2023-11-30 ENCOUNTER — Emergency Department (HOSPITAL_COMMUNITY): Payer: Medicare Other

## 2023-11-30 ENCOUNTER — Observation Stay (HOSPITAL_COMMUNITY): Payer: Medicare Other

## 2023-11-30 DIAGNOSIS — E039 Hypothyroidism, unspecified: Secondary | ICD-10-CM | POA: Diagnosis not present

## 2023-11-30 DIAGNOSIS — J9811 Atelectasis: Secondary | ICD-10-CM | POA: Diagnosis present

## 2023-11-30 DIAGNOSIS — Z881 Allergy status to other antibiotic agents status: Secondary | ICD-10-CM

## 2023-11-30 DIAGNOSIS — R55 Syncope and collapse: Principal | ICD-10-CM

## 2023-11-30 DIAGNOSIS — I160 Hypertensive urgency: Principal | ICD-10-CM | POA: Diagnosis present

## 2023-11-30 DIAGNOSIS — Z9102 Food additives allergy status: Secondary | ICD-10-CM

## 2023-11-30 DIAGNOSIS — Z79899 Other long term (current) drug therapy: Secondary | ICD-10-CM

## 2023-11-30 DIAGNOSIS — Z823 Family history of stroke: Secondary | ICD-10-CM | POA: Diagnosis not present

## 2023-11-30 DIAGNOSIS — Z833 Family history of diabetes mellitus: Secondary | ICD-10-CM | POA: Diagnosis not present

## 2023-11-30 DIAGNOSIS — Z8249 Family history of ischemic heart disease and other diseases of the circulatory system: Secondary | ICD-10-CM | POA: Diagnosis not present

## 2023-11-30 DIAGNOSIS — Z9841 Cataract extraction status, right eye: Secondary | ICD-10-CM

## 2023-11-30 DIAGNOSIS — Z841 Family history of disorders of kidney and ureter: Secondary | ICD-10-CM

## 2023-11-30 DIAGNOSIS — I5032 Chronic diastolic (congestive) heart failure: Secondary | ICD-10-CM | POA: Diagnosis present

## 2023-11-30 DIAGNOSIS — Z888 Allergy status to other drugs, medicaments and biological substances status: Secondary | ICD-10-CM

## 2023-11-30 DIAGNOSIS — Z974 Presence of external hearing-aid: Secondary | ICD-10-CM

## 2023-11-30 DIAGNOSIS — N184 Chronic kidney disease, stage 4 (severe): Secondary | ICD-10-CM | POA: Diagnosis not present

## 2023-11-30 DIAGNOSIS — R4020 Unspecified coma: Secondary | ICD-10-CM | POA: Diagnosis not present

## 2023-11-30 DIAGNOSIS — I130 Hypertensive heart and chronic kidney disease with heart failure and stage 1 through stage 4 chronic kidney disease, or unspecified chronic kidney disease: Secondary | ICD-10-CM | POA: Diagnosis present

## 2023-11-30 DIAGNOSIS — Z7982 Long term (current) use of aspirin: Secondary | ICD-10-CM

## 2023-11-30 DIAGNOSIS — F0390 Unspecified dementia without behavioral disturbance: Secondary | ICD-10-CM | POA: Diagnosis present

## 2023-11-30 DIAGNOSIS — D631 Anemia in chronic kidney disease: Secondary | ICD-10-CM | POA: Diagnosis present

## 2023-11-30 DIAGNOSIS — G629 Polyneuropathy, unspecified: Secondary | ICD-10-CM | POA: Diagnosis present

## 2023-11-30 DIAGNOSIS — E785 Hyperlipidemia, unspecified: Secondary | ICD-10-CM | POA: Diagnosis not present

## 2023-11-30 DIAGNOSIS — D649 Anemia, unspecified: Secondary | ICD-10-CM

## 2023-11-30 DIAGNOSIS — I10 Essential (primary) hypertension: Secondary | ICD-10-CM | POA: Diagnosis not present

## 2023-11-30 DIAGNOSIS — E7800 Pure hypercholesterolemia, unspecified: Secondary | ICD-10-CM | POA: Diagnosis present

## 2023-11-30 DIAGNOSIS — F419 Anxiety disorder, unspecified: Secondary | ICD-10-CM | POA: Diagnosis present

## 2023-11-30 DIAGNOSIS — W19XXXA Unspecified fall, initial encounter: Secondary | ICD-10-CM | POA: Diagnosis not present

## 2023-11-30 DIAGNOSIS — J984 Other disorders of lung: Secondary | ICD-10-CM | POA: Diagnosis not present

## 2023-11-30 DIAGNOSIS — E1142 Type 2 diabetes mellitus with diabetic polyneuropathy: Secondary | ICD-10-CM | POA: Diagnosis present

## 2023-11-30 DIAGNOSIS — K5900 Constipation, unspecified: Secondary | ICD-10-CM | POA: Diagnosis not present

## 2023-11-30 DIAGNOSIS — R001 Bradycardia, unspecified: Secondary | ICD-10-CM | POA: Diagnosis not present

## 2023-11-30 DIAGNOSIS — Z825 Family history of asthma and other chronic lower respiratory diseases: Secondary | ICD-10-CM

## 2023-11-30 DIAGNOSIS — Z8673 Personal history of transient ischemic attack (TIA), and cerebral infarction without residual deficits: Secondary | ICD-10-CM

## 2023-11-30 DIAGNOSIS — E860 Dehydration: Secondary | ICD-10-CM | POA: Diagnosis present

## 2023-11-30 DIAGNOSIS — E1165 Type 2 diabetes mellitus with hyperglycemia: Secondary | ICD-10-CM

## 2023-11-30 DIAGNOSIS — E1122 Type 2 diabetes mellitus with diabetic chronic kidney disease: Secondary | ICD-10-CM | POA: Diagnosis present

## 2023-11-30 DIAGNOSIS — Z9842 Cataract extraction status, left eye: Secondary | ICD-10-CM

## 2023-11-30 DIAGNOSIS — Z91040 Latex allergy status: Secondary | ICD-10-CM

## 2023-11-30 DIAGNOSIS — Z79890 Hormone replacement therapy (postmenopausal): Secondary | ICD-10-CM

## 2023-11-30 DIAGNOSIS — Z961 Presence of intraocular lens: Secondary | ICD-10-CM | POA: Diagnosis present

## 2023-11-30 DIAGNOSIS — Z794 Long term (current) use of insulin: Secondary | ICD-10-CM

## 2023-11-30 LAB — CBC
HCT: 31.3 % — ABNORMAL LOW (ref 36.0–46.0)
Hemoglobin: 10.5 g/dL — ABNORMAL LOW (ref 12.0–15.0)
MCH: 29.7 pg (ref 26.0–34.0)
MCHC: 33.5 g/dL (ref 30.0–36.0)
MCV: 88.4 fL (ref 80.0–100.0)
Platelets: 217 10*3/uL (ref 150–400)
RBC: 3.54 MIL/uL — ABNORMAL LOW (ref 3.87–5.11)
RDW: 13.2 % (ref 11.5–15.5)
WBC: 7.1 10*3/uL (ref 4.0–10.5)
nRBC: 0 % (ref 0.0–0.2)

## 2023-11-30 LAB — BASIC METABOLIC PANEL
Anion gap: 10 (ref 5–15)
BUN: 23 mg/dL (ref 8–23)
CO2: 22 mmol/L (ref 22–32)
Calcium: 9.3 mg/dL (ref 8.9–10.3)
Chloride: 110 mmol/L (ref 98–111)
Creatinine, Ser: 1.73 mg/dL — ABNORMAL HIGH (ref 0.44–1.00)
GFR, Estimated: 29 mL/min — ABNORMAL LOW (ref >60)
Glucose, Bld: 165 mg/dL — ABNORMAL HIGH (ref 70–99)
Potassium: 3.8 mmol/L (ref 3.5–5.1)
Sodium: 142 mmol/L (ref 135–145)

## 2023-11-30 LAB — TROPONIN I (HIGH SENSITIVITY)
Troponin I (High Sensitivity): 12 ng/L (ref <18)
Troponin I (High Sensitivity): 12 ng/L (ref <18)

## 2023-11-30 LAB — CBG MONITORING, ED: Glucose-Capillary: 122 mg/dL — ABNORMAL HIGH (ref 70–99)

## 2023-11-30 MED ORDER — INSULIN ASPART 100 UNIT/ML IJ SOLN
0.0000 [IU] | INTRAMUSCULAR | Status: DC
  Administered 2023-12-01: 1 [IU] via SUBCUTANEOUS

## 2023-11-30 NOTE — Assessment & Plan Note (Signed)
 In the setting of recent loss of her husband. Orthostatics check blood pressure did decline and patient felt lightheaded which is typical for her as per family.  But heart rate remained the same patient is no longer on Coreg . Monitor on telemetry patient have had recent echogram we will hold off on repeating Check orthostatics prior to discharge Carotid Dopplers Probably would need to have follow-up with cardiology

## 2023-11-30 NOTE — ED Triage Notes (Signed)
 Pt BIB [facility] from home d/t witnessed syncopal event while planning her husbands funeral. Family started CPR after calling 911, EMS reports upon their arrival she did have a pulse but had shallow breathing. 167/78, 58 bpm, resp 19, CBG 201, ET CO2 36. Upon arrival to ED had strong radial pulses, is responsive to voice, Pupils equal/reactive. Does have Hx of dementia & Bradycardia (per EMS). 20g Lt AC PIV.

## 2023-11-30 NOTE — H&P (Signed)
 Diana Harper FMW:[id number] DOB: 05/30/1936 DOA: 11/30/2023     PCP: Waylan Almarie SAUNDERS, MD   Outpatient Specialists:    Cardiology Dr. Charlanne   NEurology Dr. Leigh   Patient arrived to ER on 11/30/23 at 1433 Referred by Attending Ula Prentice SAUNDERS, MD   Patient coming from:    home Lives With family  Chief Complaint:  Chief Complaint  Patient presents with   Loss of Consciousness    HPI: Diana Harper is a 88 y.o. female with medical history significant of dementia, DM2, CVA ataxia, hypothyroidism, vit D deficiency, diastolic CHF    Presented with  syncope Comes from home with witnessed syncope while planning husbands funeral,  Family called 911 and started CPR, on EMS arrival pt had pulse but shallow breathing bp 167/78 HR 58 RR 19 CBG 201 By arrival to ER back to baseline Patient was sitting in front of computer when she became unresponsive family felt that she was not breathing and did CPR for about 10 seconds when she started to breathe again Patient has dementia and strokes at baseline  Family feels she have had 3 separate episodes at home Has hx of vagal syncope  Has been very constipated lately Her abd has been hurting her a bit and she only had a very small Bm just some pebbles    Denies significant ETOH intake   Does not smoke   Lab Results  Component Value Date   SARSCOV2NAA NEGATIVE 04/06/2021   SARSCOV2NAA NEGATIVE 12/12/2019    Regarding pertinent Chronic problems:    Hyperlipidemia -  on statins Lipitor  (atorvastatin )  Lipid Panel     Component Value Date/Time   CHOL 236 (H) 10/09/2023 0543   TRIG 218 (H) 10/09/2023 0543   HDL 28 (L) 10/09/2023 0543   CHOLHDL 8.4 10/09/2023 0543   VLDL 44 (H) 10/09/2023 0543   LDLCALC 164 (H) 10/09/2023 0543     HTN on NOrvasc ,    chronic CHF diastolic- last echo Recent Results (from the past 56199 hours)  ECHOCARDIOGRAM COMPLETE   Collection Time: 10/12/23  2:54 PM  Result Value   Weight 2,186.96    Height 66   BP 147/67   S' Lateral 3.10   AR max vel 1.36   AV Area VTI 1.23   AV Mean grad 9.5   AV Peak grad 15.3   Ao pk vel 1.96   Area-P 1/2 5.16   AV Area mean vel 1.24   Est EF 55 - 60%   Narrative      ECHOCARDIOGRAM REPORT     IMPRESSIONS    1. Left ventricular ejection fraction, by estimation, is 55 to 60%. The left ventricle has normal function. The left ventricle has no regional wall motion abnormalities. Left ventricular diastolic parameters are consistent with Grade I diastolic  dysfunction (impaired relaxation).  2. Right ventricular systolic function is normal. The right ventricular size is mildly enlarged.  3. Left atrial size was mildly dilated.  4. A small pericardial effusion is present. The pericardial effusion is posterior to the left ventricle.  5. The mitral valve is grossly normal. No evidence of mitral valve regurgitation. No evidence of mitral stenosis.  6. The aortic valve is calcified. There is moderate calcification of the aortic valve. There is moderate thickening of the aortic valve. Aortic valve regurgitation is not visualized. Aortic valve sclerosis/calcification is present, without any evidence  of aortic stenosis.  Conclusion(s)/Recommendation(s): No intracardiac source of embolism detected on this transthoracic  study. Consider a transesophageal echocardiogram to exclude cardiac source of embolism if clinically indicated.             DM 2 -  Lab Results  Component Value Date   HGBA1C 6.1 (H) 10/08/2023   on insulin ,  Tresiba    Hypothyroidism:   Lab Results  Component Value Date   TSH 0.792 10/08/2023   on synthroid       Hx of CVA -  with/out residual deficits on Aspirin  81 mg,      CKD stage IV  baseline Cr 1.9 Estimated Creatinine Clearance: 22.3 mL/min (A) (by C-G formula based on SCr of 1.73 mg/dL (H)).  Lab Results  Component Value Date   CREATININE 1.73 (H) 11/30/2023   CREATININE 1.89 (H) 10/14/2023   CREATININE 1.99 (H)  10/13/2023   Lab Results  Component Value Date   NA 142 11/30/2023   CL 110 11/30/2023   K 3.8 11/30/2023   CO2 22 11/30/2023   BUN 23 11/30/2023   CREATININE 1.73 (H) 11/30/2023   GFRNONAA 29 (L) 11/30/2023   CALCIUM  9.3 11/30/2023   PHOS 2.8 04/19/2023   ALBUMIN  3.6 10/08/2023   GLUCOSE 165 (H) 11/30/2023         Dementia - on Abilify     Chronic anemia - baseline hg Hemoglobin & Hematocrit  Recent Labs    10/10/23 0903 10/13/23 1233 11/30/23 1544  HGB 11.2* 10.7* 10.5*   Iron/TIBC/Ferritin/ %Sat    Component Value Date/Time   IRON 19 (L) 09/14/2022 0253   TIBC 234 (L) 09/14/2022 0253   FERRITIN 135 09/14/2022 0253   IRONPCTSAT 8 (L) 09/14/2022 0253     While in ER:    Had another episode while in ER no change on tele    Lab Orders         CBC         Basic metabolic panel      CT HEAD   NON acute    CXR - Mild left basilar atelectasis/airspace disease.    Following Medications were ordered in ER: Medications - No data to display       ED Triage Vitals  Encounter Vitals Group     BP 11/30/23 1545 (!) 192/91     Systolic BP Percentile --      Diastolic BP Percentile --      Pulse Rate 11/30/23 1545 (!) 59     Resp 11/30/23 1545 16     Temp --      Temp src --      SpO2 11/30/23 1545 100 %     Weight 11/30/23 1437 132 lb 0.9 oz (59.9 kg)     Height 11/30/23 1437 5' 6 (1.676 m)     Head Circumference --      Peak Flow --      Pain Score 11/30/23 1906 0     Pain Loc --      Pain Education --      Exclude from Growth Chart --   UFJK(75)@     _________________________________________ Significant initial  Findings: Abnormal Labs Reviewed  CBC - Abnormal; Notable for the following components:      Result Value   RBC 3.54 (*)    Hemoglobin 10.5 (*)    HCT 31.3 (*)    All other components within normal limits  BASIC METABOLIC PANEL - Abnormal; Notable for the following components:   Glucose, Bld 165 (*)    Creatinine, Ser 1.73 (*)  GFR,  Estimated 29 (*)    All other components within normal limits    _________________________ Troponin  ordered Cardiac Panel (last 3 results) Recent Labs    11/30/23 1544 11/30/23 1822  TROPONINIHS 12 12    ECG: Ordered Personally reviewed and interpreted by me showing: HR : 57 Rhythm: Sinus rhythm Anterior infarct, old QTC 411  BNP (last 3 results) Recent Labs    04/18/23 1626  BNP 18.0     COVID-19 Labs  No results for input(s): DDIMER, FERRITIN, LDH, CRP in the last 72 hours.  Lab Results  Component Value Date   SARSCOV2NAA NEGATIVE 04/06/2021   SARSCOV2NAA NEGATIVE 12/12/2019   The recent clinical data is shown below. Vitals:   11/30/23 1745 11/30/23 1900 11/30/23 1945 11/30/23 2100  BP: (!) 180/75 (!) 173/74 (!) 193/87 (!) 159/75  Pulse: 70 (!) 56 (!) 56 62  Resp: 18 11 14 14   SpO2: 98% 99% 100% 100%  Weight:      Height:        WBC     Component Value Date/Time   WBC 7.1 11/30/2023 1544   LYMPHSABS 1.7 10/10/2023 0903   MONOABS 0.4 10/10/2023 0903   EOSABS 0.1 10/10/2023 0903   BASOSABS 0.0 10/10/2023 0903     Lactic Acid, Venous    Component Value Date/Time   LATICACIDVEN 1.2 07/24/2022 1205        UA   ordered   Urine analysis:    Component Value Date/Time   COLORURINE YELLOW 10/08/2023 1306   APPEARANCEUR CLEAR 10/08/2023 1306   LABSPEC 1.016 10/08/2023 1306   PHURINE 5.0 10/08/2023 1306   GLUCOSEU 50 (A) 10/08/2023 1306   HGBUR NEGATIVE 10/08/2023 1306   BILIRUBINUR NEGATIVE 10/08/2023 1306   KETONESUR NEGATIVE 10/08/2023 1306   PROTEINUR >=300 (A) 10/08/2023 1306   UROBILINOGEN 0.2 04/15/2012 1217   NITRITE NEGATIVE 10/08/2023 1306   LEUKOCYTESUR NEGATIVE 10/08/2023 1306    Results for orders placed or performed during the hospital encounter of 11/10/22  Urine Culture     Status: Abnormal   Collection Time: 11/10/22  2:48 PM   Specimen: Urine, Clean Catch  Result Value Ref Range Status   Specimen Description    Final    URINE, CLEAN CATCH Performed at [facility], [address] [place], [place] [address]    Special Requests   Final    NONE Performed at [facility], [address], [place], [place] [address]    Culture 50,000 COLONIES/mL PROTEUS MIRABILIS (A)  Final   Report Status 11/12/2022 FINAL  Final   Organism ID, Bacteria PROTEUS MIRABILIS (A)  Final      Susceptibility   Proteus mirabilis - MIC*    AMPICILLIN <=2 SENSITIVE Sensitive     CEFAZOLIN 8 SENSITIVE Sensitive     CEFEPIME  <=0.12 SENSITIVE Sensitive     CEFTRIAXONE  <=0.25 SENSITIVE Sensitive     CIPROFLOXACIN  <=0.25 SENSITIVE Sensitive     GENTAMICIN <=1 SENSITIVE Sensitive     IMIPENEM 8 INTERMEDIATE Intermediate     NITROFURANTOIN 128 RESISTANT Resistant     TRIMETH/SULFA <=20 SENSITIVE Sensitive     AMPICILLIN/SULBACTAM <=2 SENSITIVE Sensitive     PIP/TAZO <=4 SENSITIVE Sensitive     * 50,000 COLONIES/mL PROTEUS MIRABILIS    __________________________________________________________ Recent Labs  Lab 11/30/23 1544  NA 142  K 3.8  CO2 22  GLUCOSE 165*  BUN 23  CREATININE 1.73*  CALCIUM  9.3    Cr  stable,    Lab Results  Component Value Date   CREATININE 1.73 (H) 11/30/2023   CREATININE 1.89 (H) 10/14/2023   CREATININE 1.99 (H) 10/13/2023    No results for input(s): AST, ALT, ALKPHOS, BILITOT, PROT, ALBUMIN  in the last 168 hours. Lab Results  Component Value Date   CALCIUM  9.3 11/30/2023   PHOS 2.8 04/19/2023       Plt: Lab Results  Component Value Date   PLT 217 11/30/2023       Recent Labs  Lab 11/30/23 1544  WBC 7.1  HGB 10.5*  HCT 31.3*  MCV 88.4  PLT 217    HG/HCT  stable     Component Value Date/Time   HGB 10.5 (L) 11/30/2023 1544   HCT 31.3 (L) 11/30/2023 1544   MCV 88.4 11/30/2023 1544    _______________________________________________ Hospitalist was called for admission for   Syncope,      The following Work up  has been ordered so far:  Orders Placed This Encounter  Procedures   DG Chest 2 View   CT Head Wo Contrast   CBC   Basic metabolic panel   Consult to hospitalist   EKG 12-Lead   EKG 12-Lead     OTHER Significant initial  Findings:  labs showing:     DM  labs:  HbA1C: Recent Labs    04/18/23 1625 04/19/23 0133 10/08/23 1900  HGBA1C 7.8* 7.7* 6.1*       CBG (last 3)  No results for input(s): GLUCAP in the last 72 hours.        Cultures:    Component Value Date/Time   SDES  11/10/2022 1448    URINE, CLEAN CATCH Performed at [facility], [address], [place], [place] [address]    SPECREQUEST  11/10/2022 1448    NONE Performed at [facility], [address] [place], [place] [address]    CULT 50,000 COLONIES/mL PROTEUS MIRABILIS (A) 11/10/2022 1448   REPTSTATUS 11/12/2022 FINAL 11/10/2022 1448     Radiological Exams on Admission: DG Chest 2 View Result Date: 11/30/2023 CLINICAL DATA:  Syncope. EXAM: CHEST - 2 VIEW COMPARISON:  Chest radiograph dated 10/08/2023. FINDINGS: The heart size and mediastinal contours are within normal limits. There is mild left basilar atelectasis/airspace disease. The right lung is clear. No pleural effusion or pneumothorax. Degenerative changes are seen in the spine. IMPRESSION: Mild left basilar atelectasis/airspace disease. Electronically Signed   By: Norman Hopper M.D.   On: 11/30/2023 16:44   CT Head Wo Contrast Result Date: 11/30/2023 CLINICAL DATA:  Altered mental status, loss of consciousness. EXAM: CT HEAD WITHOUT CONTRAST TECHNIQUE: Contiguous axial images were obtained from the base of the skull through the vertex without intravenous contrast. RADIATION DOSE REDUCTION: This exam was performed according to the departmental dose-optimization program which includes automated exposure control, adjustment of the mA and/or kV according to patient size and/or use of iterative reconstruction  technique. COMPARISON:  CT head dated 10/08/2023. FINDINGS: Brain: No evidence of acute infarction, hemorrhage, hydrocephalus, extra-axial collection or mass lesion/mass effect. There is mild cerebral volume loss with associated ex vacuo dilatation. Periventricular white matter hypoattenuation likely represents chronic small vessel ischemic disease. Chronic lacunar infarcts involving the right basal ganglia, left thalamus, and right corona radiata. Vascular: There are vascular calcifications in the carotid siphons. Skull: Normal. Negative for fracture or focal lesion. Sinuses/Orbits: There is a left sphenoid sinus mucous retention cyst versus polyp. Other: None. IMPRESSION: No acute intracranial process.  Electronically Signed   By: Norman Hopper M.D.   On: 11/30/2023 16:43   _______________________________________________________________________________________________________ Latest  Blood pressure (!) 159/75, pulse 62, resp. rate 14, height 5' 6 (1.676 m), weight 59.9 kg, SpO2 100%.   Vitals  labs and radiology finding personally reviewed  Review of Systems:    Pertinent positives include:  syncope  Constitutional:  No weight loss, night sweats, Fevers, chills, fatigue, weight loss  HEENT:  No headaches, Difficulty swallowing,Tooth/dental problems,Sore throat,  No sneezing, itching, ear ache, nasal congestion, post nasal drip,  Cardio-vascular:  No chest pain, Orthopnea, PND, anasarca, dizziness, palpitations.no Bilateral lower extremity swelling  GI:  No heartburn, indigestion, abdominal pain, nausea, vomiting, diarrhea, change in bowel habits, loss of appetite, melena, blood in stool, hematemesis Resp:  no shortness of breath at rest. No dyspnea on exertion, No excess mucus, no productive cough, No non-productive cough, No coughing up of blood.No change in color of mucus.No wheezing. Skin:  no rash or lesions. No jaundice GU:  no dysuria, change in color of urine, no urgency or  frequency. No straining to urinate.  No flank pain.  Musculoskeletal:  No joint pain or no joint swelling. No decreased range of motion. No back pain.  Psych:  No change in mood or affect. No depression or anxiety. No memory loss.  Neuro: no localizing neurological complaints, no tingling, no weakness, no double vision, no gait abnormality, no slurred speech, no confusion  All systems reviewed and apart from HOPI all are negative _______________________________________________________________________________________________ Past Medical History:   Past Medical History:  Diagnosis Date   Anemia    Anxiety    Arthritis    Asthma    Biliary colic    C. difficile colitis 08/2022   Cholelithiasis    CKD (chronic kidney disease), stage III [facility])    nephrologist--  dr oliver   Colon polyps    Diverticulosis of colon    Gastric ulcer    Glaucoma    History of asthma    1980's  no longer problem since 1980's   History of diverticulitis of colon    recurrent--  2014;  2013;  2012   Hyperlipidemia    Hypertension    Hypothyroidism    Insulin  dependent type 2 diabetes mellitus (HCC)    followed by dr norleen lambeth ([facility])    Murmur    Peripheral neuropathy    Stroke [facility])    Vitamin D  deficiency    Wears hearing aid    bilateral   Wears partial dentures    lower partial and upper full    Past Surgical History:  Procedure Laterality Date   ABDOMINAL HYSTERECTOMY  1970's   CARDIOVASCULAR STRESS TEST  07/30/2009   normal exercise lexiscan  nuclear study w/ no ischemia/  normal LV funciton and wall motion, ef 77%   CATARACT EXTRACTION W/ INTRAOCULAR LENS  IMPLANT, BILATERAL  2014   CATARACT EXTRACTION, BILATERAL Bilateral 2021   LAPAROSCOPIC CHOLECYSTECTOMY SINGLE SITE WITH INTRAOPERATIVE CHOLANGIOGRAM N/A 08/25/2016   Procedure: LAPAROSCOPIC CHOLECYSTECTOMY WITH INTRAOPERATIVE CHOLANGIOGRAM;  Surgeon: Herlene Beverley Bureau, MD;  Location: [facility];  Service:  General;  Laterality: N/A;   LUMBAR SPINE SURGERY  2014   REMOVAL AXILLA CYST Right 1990's    Social History:  Ambulatory  walker      reports that she has never smoked. She has never used smokeless tobacco. She reports that she does not drink alcohol  and does not use drugs.   Family History:  Family History  Problem Relation Age of Onset   Stroke Mother    Colon polyps Mother    Diabetes Mother    Diabetes Father    Kidney failure Father    COPD Father    Colon polyps Sister    Diabetes Sister    Stroke Brother    Diabetes Brother    Heart attack Maternal Grandmother    Heart disease Maternal Grandfather    Diabetes Paternal Grandmother    Stroke Paternal Grandfather    Hypertension Paternal Grandfather    Diabetes Sister    Diabetes Sister    Diabetes Son    Diabetes Daughter    Colon cancer Neg Hx    Esophageal cancer Neg Hx    Inflammatory bowel disease Neg Hx    Liver disease Neg Hx    Pancreatic cancer Neg Hx    Rectal cancer Neg Hx    Stomach cancer Neg Hx    ______________________________________________________________________________________________ Allergies: Allergies  Allergen Reactions   Latex Anaphylaxis, Shortness Of Breath and Other (See Comments)    Severe respiratory distress   Onion Diarrhea   Other Other (See Comments)    Patient cannot have TREE NUTS and anything with seeds- History of  diverticulitis    Flexeril  [Cyclobenzaprine ] Other (See Comments)    Caused agitation   Lisinopril Cough   Metformin And Related Diarrhea   Simvastatin Hives and Rash   Tetracycline Rash   Zithromax [Azithromycin] Rash     Prior to Admission medications   Medication Sig Start Date End Date Taking? Authorizing Provider  acetaminophen  (TYLENOL ) 500 MG tablet Take 1,000 mg by mouth every 8 (eight) hours as needed for mild pain, headache or fever.    [provider]  albuterol  (VENTOLIN  HFA) 108 (90 Base) MCG/ACT inhaler Inhale 2 puffs into  the lungs every 4 (four) hours as needed for wheezing or shortness of breath.    [provider]  amLODipine  (NORVASC ) 5 MG tablet Take 1 tablet (5 mg total) by mouth daily. 10/14/23   Vann, Jessica U, DO  ARIPiprazole  (ABILIFY ) 2 MG tablet Take 2 mg by mouth daily.    [provider]  aspirin  EC 81 MG tablet Take 1 tablet (81 mg total) by mouth daily. Swallow whole. Patient taking differently: Take 81 mg by mouth as needed. Swallow whole. 10/15/23   Vann, Jessica U, DO  atorvastatin  (LIPITOR ) 40 MG tablet Take 1 tablet (40 mg total) by mouth daily. 10/15/23   Vann, Jessica U, DO  carvedilol  (COREG ) 6.25 MG tablet Take 1 tablet (6.25 mg total) by mouth 2 (two) times daily with a meal. Patient not taking: Reported on 11/16/2023 10/14/23   Vann, Jessica U, DO  Cholecalciferol  (VITAMIN D3) 125 MCG (5000 UT) CAPS Take 5,000 Units by mouth daily.    [provider]  Continuous Blood Gluc Receiver (FREESTYLE LIBRE 2 READER) DEVI See admin instructions. 10/13/22   [provider]  Continuous Blood Gluc Sensor (FREESTYLE LIBRE 14 DAY SENSOR) MISC Inject 1 Device into the skin every 14 (fourteen) days.    [provider]  D-Mannose 500 MG CAPS Take 500 mg by mouth daily. Patient not taking: Reported on 11/16/2023    [provider]  diclofenac Sodium (VOLTAREN) 1 % GEL Apply 2 g topically 4 (four) times daily as needed (joint pain).    [provider]  Glucagon HCl 1 MG SOLR Inject 1 mg into the skin as needed (for a blood sugar <67).  [provider]  insulin  degludec (TRESIBA ) 200 UNIT/ML FlexTouch Pen Inject 16 Units into the skin See admin instructions. 50 units SQ once daily if BG is greater than 150 10/14/23   Vann, Jessica U, DO  L-Theanine 100 MG CAPS Take 100 mg by mouth daily. Patient not taking: Reported on 11/16/2023    [provider]  levothyroxine  (SYNTHROID ) 88 MCG tablet Take 88 mcg by mouth daily before  breakfast.    [provider]  nitroGLYCERIN  (NITROSTAT ) 0.4 MG SL tablet Place 1 tablet (0.4 mg total) under the tongue every 5 (five) minutes as needed for chest pain. 01/20/21 10/07/24  Cantwell, Celeste C, PA-C  NOVOLOG  FLEXPEN 100 UNIT/ML FlexPen Inject 5-10 Units into the skin See admin instructions. Inject 5 units into the skin before meals if BGL is 100-150, 7 units if BGL is 151-200, and 10 units if BGL is greater than 200 05/21/22   [provider]  sertraline  (ZOLOFT ) 50 MG tablet Take 50 mg by mouth at bedtime.    [provider]    ___________________________________________________________________________________________________ Physical Exam:    11/30/2023    9:00 PM 11/30/2023    7:45 PM 11/30/2023    7:00 PM  Vitals with BMI  Systolic 159 193 826  Diastolic 75 87 74  Pulse 62 56 56     1. General:  in No  Acute distress   Chronically ill-appearing 2. Psychological: Alert and   Oriented 3. Head/ENT:    Dry Mucous Membranes                          Head Non traumatic, neck supple                            Poor Dentition 4. SKIN:  decreased Skin turgor,  Skin clean Dry and intact no rash    5. Heart: Regular rate and rhythm no  Murmur, no Rub or gallop 6. Lungs: , no wheezes or crackles   7. Abdomen: Soft,  non-tender, Non distended   obese  bowel sounds present 8. Lower extremities: no clubbing, cyanosis, no  edema 9. Neurologically Grossly intact, moving all 4 extremities equally  10. MSK: Normal range of motion    Chart has been reviewed  ______________________________________________________________________________________________  Assessment/Plan 88 y.o. female with medical history significant of dementia, DM2, CVA, hypothyroidism, vit D deficiency, diastolic CHF   Admitted for syncope suspect vasovagal     Present on Admission:  Syncope, vasovagal  CKD (chronic kidney disease), stage IV (HCC)  Hypothyroidism  Uncontrolled type 2  diabetes mellitus with hyperglycemia, without long-term current use of insulin  (HCC)  Anemia    CKD (chronic kidney disease), stage IV (HCC)  -chronic avoid nephrotoxic medications such as NSAIDs, Vanco Zosyn  combo,  avoid hypotension, continue to follow renal function   History of CVA (cerebrovascular accident) Continue aspirin  and statin  Hypothyroidism - Check TSH continue home medications Synthroid  at 88 mcg po q day   Uncontrolled type 2 diabetes mellitus with hyperglycemia, without long-term current use of insulin  (HCC) Order sliding scale hold p.o. medications Continue Tresiba  but decrease the dose to 10 units  Anemia Obtain anemia panel  Transfuse for Hg <7 , rapidly dropping or  if symptomatic   Syncope, vasovagal In the setting of recent loss of her husband. Orthostatics check blood pressure did decline and patient felt lightheaded which is typical for her as  per family.  But heart rate remained the same patient is no longer on Coreg . Monitor on telemetry patient have had recent echogram we will hold off on repeating Check orthostatics prior to discharge Carotid Dopplers Probably would need to have follow-up with cardiology   Other plan as per orders.  DVT prophylaxis:  SCD       Code Status:    Code Status: Prior FULL CODE  as per familiy I had personally discussed CODE STATUS with   family  ACP   none     Family Communication:   Family   at  Bedside  plan of care was discussed   with  Daughter,   Diet diabetic   Disposition Plan:        To home once workup is complete and patient is stable   Following barriers for discharge:                             Syncope work up is complete                                  Consult Orders  (From admission, onward)           Start     Ordered   11/30/23 2020  Consult to hospitalist  Once       Provider:  (Not yet assigned)  Question Answer Comment  Place call to: [facility]   Reason for Consult  Admit      11/30/23 2020                               Would benefit from PT/OT eval prior to DC  Ordered                                         Consults called:    emailed cardiology    Admission status:  ED Disposition     ED Disposition  Admit   Condition  --   Comment  Hospital Area[facility] [[id number]] Level of Care: Telemetry Cardiac [103] May place patient in observation at [facility] or [facility] if equivalent level of care is available:: No Covid Evaluation: Asymptomatic - no recent expo sure (last 10 days) testing not required Diagnosis: Syncope [206001] Admitting Physician: Emillie Chasen [[id number]] Attending Physician: Iridian Reader [[id number]]           Obs    Level of care     tele  For   24H        Lab Results  Component Value Date   SARSCOV2NAA NEGATIVE 04/06/2021    Mychelle Kendra 12/01/2023, 1:12 AM    [facility]     after 2 AM please page floor coverage PA If 7AM-7PM, please contact the day team taking care of the patient using [email or web address]

## 2023-11-30 NOTE — Assessment & Plan Note (Signed)
 Order sliding scale hold p.o. medications Continue Diana Harper but decrease the dose to 10 units

## 2023-11-30 NOTE — Assessment & Plan Note (Signed)
-   Check TSH continue home medications Synthroid at 88mcg po q day  

## 2023-11-30 NOTE — Subjective & Objective (Signed)
 Comes from home with witnessed syncope while planning husbands funeral,  Family called 911 and started CPR, on EMS arrival pt had pulse but shallow breathing bp 167/78 HR 58 RR 19 CBG 201 By arrival to ER back to baseline Patient was sitting in front of computer when she became unresponsive family felt that she was not breathing and did CPR for about 10 seconds when she started to breathe again Patient has dementia and strokes at baseline

## 2023-11-30 NOTE — ED Triage Notes (Addendum)
 error

## 2023-11-30 NOTE — Assessment & Plan Note (Signed)
 Continue aspirin and statin.

## 2023-11-30 NOTE — Assessment & Plan Note (Signed)
 Obtain anemia panel  Transfuse for Hg <7 , rapidly dropping or  if symptomatic

## 2023-11-30 NOTE — Assessment & Plan Note (Signed)
-  chronic avoid nephrotoxic medications such as NSAIDs, Vanco Zosyn combo,  avoid hypotension, continue to follow renal function

## 2023-11-30 NOTE — ED Provider Notes (Signed)
 [facility] Provider Note   CSN: [id number] Arrival date & time: 11/30/23  1433     History  Chief Complaint  Patient presents with   Loss of Consciousness    Diana Harper is a 88 y.o. female.  This is an 88 year old female presenting emergency department after a syncopal episode.  Patient had a witnessed event while she was at a funeral home for husband.  She is sitting in front of the computer when she went unresponsive.  Family noted that she was not breathing they did CPR for 10 seconds she started breathing again stated that x 3.  EMS arrived and noted patient with good respiration and strong pulse.  She has a history of dementia and recent strokes.  She is dependent on care by family.  Patient will not talk other than to tell me her name.  Keeping her eyes closed, will somewhat follow commands.  Daughter notes that this is not a atypical pain, but it is not usual either.  There have been no reported preceding   Loss of Consciousness      Home Medications Prior to Admission medications   Medication Sig Start Date End Date Taking? Authorizing Provider  acetaminophen  (TYLENOL ) 500 MG tablet Take 1,000 mg by mouth every 8 (eight) hours as needed for mild pain, headache or fever.    [provider]  albuterol  (VENTOLIN  HFA) 108 (90 Base) MCG/ACT inhaler Inhale 2 puffs into the lungs every 4 (four) hours as needed for wheezing or shortness of breath.    [provider]  amLODipine  (NORVASC ) 5 MG tablet Take 1 tablet (5 mg total) by mouth daily. 10/14/23   Vann, Jessica U, DO  ARIPiprazole  (ABILIFY ) 2 MG tablet Take 2 mg by mouth daily.    [provider]  aspirin  EC 81 MG tablet Take 1 tablet (81 mg total) by mouth daily. Swallow whole. Patient taking differently: Take 81 mg by mouth as needed. Swallow whole. 10/15/23   Vann, Jessica U, DO  atorvastatin  (LIPITOR ) 40 MG tablet Take 1 tablet (40 mg total) by  mouth daily. 10/15/23   Vann, Jessica U, DO  carvedilol  (COREG ) 6.25 MG tablet Take 1 tablet (6.25 mg total) by mouth 2 (two) times daily with a meal. Patient not taking: Reported on 11/16/2023 10/14/23   Vann, Jessica U, DO  Cholecalciferol  (VITAMIN D3) 125 MCG (5000 UT) CAPS Take 5,000 Units by mouth daily.    [provider]  Continuous Blood Gluc Receiver (FREESTYLE LIBRE 2 READER) DEVI See admin instructions. 10/13/22   [provider]  Continuous Blood Gluc Sensor (FREESTYLE LIBRE 14 DAY SENSOR) MISC Inject 1 Device into the skin every 14 (fourteen) days.    [provider]  D-Mannose 500 MG CAPS Take 500 mg by mouth daily. Patient not taking: Reported on 11/16/2023    [provider]  diclofenac Sodium (VOLTAREN) 1 % GEL Apply 2 g topically 4 (four) times daily as needed (joint pain).    [provider]  Glucagon HCl 1 MG SOLR Inject 1 mg into the skin as needed (for a blood sugar <67).    [provider]  insulin  degludec (TRESIBA ) 200 UNIT/ML FlexTouch Pen Inject 16 Units into the skin See admin instructions. 50 units SQ once daily if BG is greater than 150 10/14/23   Vann, Jessica U, DO  L-Theanine 100 MG CAPS Take 100 mg by mouth daily. Patient not taking: Reported on 11/16/2023  [provider]  levothyroxine  (SYNTHROID ) 88 MCG tablet Take 88 mcg by mouth daily before breakfast.    [provider]  nitroGLYCERIN  (NITROSTAT ) 0.4 MG SL tablet Place 1 tablet (0.4 mg total) under the tongue every 5 (five) minutes as needed for chest pain. 01/20/21 10/07/24  Cantwell, Celeste C, PA-C  NOVOLOG  FLEXPEN 100 UNIT/ML FlexPen Inject 5-10 Units into the skin See admin instructions. Inject 5 units into the skin before meals if BGL is 100-150, 7 units if BGL is 151-200, and 10 units if BGL is greater than 200 05/21/22   [provider]  sertraline  (ZOLOFT ) 50 MG tablet Take 50 mg by mouth at bedtime.    [provider]      Allergies    Latex, Onion, Other, Flexeril  [cyclobenzaprine ], Lisinopril, Metformin and related, Simvastatin, Tetracycline, and Zithromax [azithromycin]    Review of Systems   Review of Systems  Cardiovascular:  Positive for syncope.    Physical Exam Updated Vital Signs BP (!) 192/91 (BP Location: Right Arm)   Pulse (!) 59   Resp 16   Ht 5' 6 (1.676 m)   Wt 59.9 kg   SpO2 100%   BMI 21.31 kg/m  Physical Exam  ED Results / Procedures / Treatments   Labs (all labs ordered are listed, but only abnormal results are displayed) Labs Reviewed  CBC  BASIC METABOLIC PANEL  TROPONIN I (HIGH SENSITIVITY)  TROPONIN I (HIGH SENSITIVITY)    EKG None  Radiology DG Chest 2 View Result Date: 11/30/2023 CLINICAL DATA:  Syncope. EXAM: CHEST - 2 VIEW COMPARISON:  Chest radiograph dated 10/08/2023. FINDINGS: The heart size and mediastinal contours are within normal limits. There is mild left basilar atelectasis/airspace disease. The right lung is clear. No pleural effusion or pneumothorax. Degenerative changes are seen in the spine. IMPRESSION: Mild left basilar atelectasis/airspace disease. Electronically Signed   By: Norman Hopper M.D.   On: 11/30/2023 16:44   CT Head Wo Contrast Result Date: 11/30/2023 CLINICAL DATA:  Altered mental status, loss of consciousness. EXAM: CT HEAD WITHOUT CONTRAST TECHNIQUE: Contiguous axial images were obtained from the base of the skull through the vertex without intravenous contrast. RADIATION DOSE REDUCTION: This exam was performed according to the departmental dose-optimization program which includes automated exposure control, adjustment of the mA and/or kV according to patient size and/or use of iterative reconstruction technique. COMPARISON:  CT head dated 10/08/2023. FINDINGS: Brain: No evidence of acute infarction, hemorrhage, hydrocephalus, extra-axial collection or mass lesion/mass effect. There is mild cerebral volume loss with  associated ex vacuo dilatation. Periventricular white matter hypoattenuation likely represents chronic small vessel ischemic disease. Chronic lacunar infarcts involving the right basal ganglia, left thalamus, and right corona radiata. Vascular: There are vascular calcifications in the carotid siphons. Skull: Normal. Negative for fracture or focal lesion. Sinuses/Orbits: There is a left sphenoid sinus mucous retention cyst versus polyp. Other: None. IMPRESSION: No acute intracranial process. Electronically Signed   By: Norman Hopper M.D.   On: 11/30/2023 16:43    Procedures Procedures    Medications Ordered in ED Medications - No data to display  ED Course/ Medical Decision Making/ A&P                                 Medical Decision Making Is a 88 year old female presenting emergency department after syncopal episode.  She is well-appearing on exam, no obvious signs of trauma.  Has  dementia and is unreliable historian.  At baseline she is dependent on total care by family members.  Also has does have history of hypertension murmur stroke CKD.  Family denies cardiac history but notes has vasovagal episodes.  Will get cardiopulmonary workup.  See ED course for full MDM and disposition.  Amount and/or Complexity of Data Reviewed Independent Historian:     Details: Daughter noted that mother has had other episodes of passing out, but none where she stopped breathing like this. External Data Reviewed:     Details: Saw cardiology 05/04/23 last and per their note history consists of . :1. Cerebrovascular accident (CVA) due to embolism of right middle cerebral artery (HCC) I discussed with the patient regarding options of therapy.   However embolic stroke is most probably related to either aortic atherosclerosis and atheroemboli versus PAF leading to cardiac source of cerebral emboli.   Options would be to place a loop recorder, control risk factors including hypertension, hypercholesterolemia and  diabetes mellitus.   Other option if she does not want a loop recorder would be to do a TEE to see for cardiac source of cerebral emboli.  If no etiology found, then she may need a loop recorder.   Patient would like to think about both this and then make decision.   For now continue aspirin  for a total of 30 days followed by Plavix  indefinitely.   - atorvastatin  (LIPITOR ) 40 MG tablet; Take 1 tablet (40 mg total) by mouth daily.  Dispense: 90 tablet; Refill: 1   2. Hypercholesteremia Patient was not discharged on a statin.  Her LDL is not at goal.  I have started her on Lipitor  40 mg daily.  She will need lipid profile testing which can be performed by Dr. To be anywhere from 6 to 8 weeks or 3 months from now.   - atorvastatin  (LIPITOR ) 40 MG tablet; Take 1 tablet (40 mg total) by mouth daily.  Dispense: 90 tablet; Refill: 1   3. Primary hypertension Since having made medication changes, blood pressure in excellent control.  Continue present medications.  If she has any event or if she would want to proceed with the procedure, certainly will set this up as well.  Patient's daughter present.  Labs: ordered. Decision-making details documented in ED Course. Radiology: ordered. Decision-making details documented in ED Course.    Details: CT head without acute pathology.  Chest x-ray without pneumonia pneumothorax ECG/medicine tests: ordered and independent interpretation performed.    Details: Does not have ST segment changes to indicate ischemia.  Appears to be sinus rhythm.  Risk Decision regarding hospitalization. Diagnosis or treatment significantly limited by social determinants of health. Risk Details: Dementia.          Final Clinical Impression(s) / ED Diagnoses Final diagnoses:  None    Rx / DC Orders ED Discharge Orders     None         Neysa Caron PARAS, DO 11/30/23 1718

## 2023-11-30 NOTE — ED Provider Notes (Signed)
 I was asked to follow-up on the patient's labs and imaging studies and to reassess the patient for ultimate disposition.  Physical Exam  BP (!) 193/87   Pulse (!) 56   Resp 14   Ht 5' 6 (1.676 m)   Wt 59.9 kg   SpO2 100%   BMI 21.31 kg/m   Physical Exam  Procedures  Procedures  ED Course / MDM    Medical Decision Making Amount and/or Complexity of Data Reviewed Labs: ordered. Radiology: ordered. ECG/medicine tests: ordered.   The patient's labs and imaging are largely reassuring.  And reassessment the patient apparently did have 1 other episode while she was here up some lightheadedness.  She remained stable here on the monitor.  Given her age we discussed the risks and benefits of hospitalization with the patient and her family.  Ultimately, with her having multiple episodes they would like the patient to be further observed here in the hospital overnight.  A call was placed to the hospitalist service for admission.  She does have some atelectasis versus infiltrate on her chest x-ray.  I did discuss this with the patient.  She does not have any findings consistent with pneumonia so we will hold off on treating this at this time.       Ula Prentice SAUNDERS, MD 11/30/23 2022

## 2023-12-01 ENCOUNTER — Observation Stay (HOSPITAL_COMMUNITY): Payer: Medicare Other

## 2023-12-01 DIAGNOSIS — I5032 Chronic diastolic (congestive) heart failure: Secondary | ICD-10-CM | POA: Diagnosis present

## 2023-12-01 DIAGNOSIS — Z825 Family history of asthma and other chronic lower respiratory diseases: Secondary | ICD-10-CM | POA: Diagnosis not present

## 2023-12-01 DIAGNOSIS — Z881 Allergy status to other antibiotic agents status: Secondary | ICD-10-CM | POA: Diagnosis not present

## 2023-12-01 DIAGNOSIS — Z841 Family history of disorders of kidney and ureter: Secondary | ICD-10-CM | POA: Diagnosis not present

## 2023-12-01 DIAGNOSIS — Z79899 Other long term (current) drug therapy: Secondary | ICD-10-CM | POA: Diagnosis not present

## 2023-12-01 DIAGNOSIS — D631 Anemia in chronic kidney disease: Secondary | ICD-10-CM | POA: Diagnosis present

## 2023-12-01 DIAGNOSIS — R55 Syncope and collapse: Secondary | ICD-10-CM

## 2023-12-01 DIAGNOSIS — E039 Hypothyroidism, unspecified: Secondary | ICD-10-CM | POA: Diagnosis present

## 2023-12-01 DIAGNOSIS — Z823 Family history of stroke: Secondary | ICD-10-CM | POA: Diagnosis not present

## 2023-12-01 DIAGNOSIS — Z7982 Long term (current) use of aspirin: Secondary | ICD-10-CM | POA: Diagnosis not present

## 2023-12-01 DIAGNOSIS — I160 Hypertensive urgency: Secondary | ICD-10-CM | POA: Diagnosis present

## 2023-12-01 DIAGNOSIS — J9811 Atelectasis: Secondary | ICD-10-CM | POA: Diagnosis present

## 2023-12-01 DIAGNOSIS — F0390 Unspecified dementia without behavioral disturbance: Secondary | ICD-10-CM | POA: Diagnosis present

## 2023-12-01 DIAGNOSIS — I130 Hypertensive heart and chronic kidney disease with heart failure and stage 1 through stage 4 chronic kidney disease, or unspecified chronic kidney disease: Secondary | ICD-10-CM | POA: Diagnosis present

## 2023-12-01 DIAGNOSIS — E7800 Pure hypercholesterolemia, unspecified: Secondary | ICD-10-CM | POA: Diagnosis present

## 2023-12-01 DIAGNOSIS — E1165 Type 2 diabetes mellitus with hyperglycemia: Secondary | ICD-10-CM | POA: Diagnosis present

## 2023-12-01 DIAGNOSIS — Z91040 Latex allergy status: Secondary | ICD-10-CM | POA: Diagnosis not present

## 2023-12-01 DIAGNOSIS — K5900 Constipation, unspecified: Secondary | ICD-10-CM | POA: Diagnosis not present

## 2023-12-01 DIAGNOSIS — E785 Hyperlipidemia, unspecified: Secondary | ICD-10-CM | POA: Diagnosis not present

## 2023-12-01 DIAGNOSIS — N184 Chronic kidney disease, stage 4 (severe): Secondary | ICD-10-CM | POA: Diagnosis present

## 2023-12-01 DIAGNOSIS — Z8249 Family history of ischemic heart disease and other diseases of the circulatory system: Secondary | ICD-10-CM | POA: Diagnosis not present

## 2023-12-01 DIAGNOSIS — E860 Dehydration: Secondary | ICD-10-CM | POA: Diagnosis present

## 2023-12-01 DIAGNOSIS — Z794 Long term (current) use of insulin: Secondary | ICD-10-CM | POA: Diagnosis not present

## 2023-12-01 DIAGNOSIS — E1122 Type 2 diabetes mellitus with diabetic chronic kidney disease: Secondary | ICD-10-CM | POA: Diagnosis present

## 2023-12-01 DIAGNOSIS — Z8673 Personal history of transient ischemic attack (TIA), and cerebral infarction without residual deficits: Secondary | ICD-10-CM | POA: Diagnosis not present

## 2023-12-01 DIAGNOSIS — Z833 Family history of diabetes mellitus: Secondary | ICD-10-CM | POA: Diagnosis not present

## 2023-12-01 DIAGNOSIS — E1142 Type 2 diabetes mellitus with diabetic polyneuropathy: Secondary | ICD-10-CM | POA: Diagnosis present

## 2023-12-01 LAB — URINALYSIS, COMPLETE (UACMP) WITH MICROSCOPIC
Bacteria, UA: NONE SEEN
Bilirubin Urine: NEGATIVE
Glucose, UA: NEGATIVE mg/dL
Hgb urine dipstick: NEGATIVE
Ketones, ur: NEGATIVE mg/dL
Nitrite: POSITIVE — AB
Protein, ur: >=300 mg/dL — AB
Specific Gravity, Urine: 1.013 (ref 1.005–1.030)
pH: 6 (ref 5.0–8.0)

## 2023-12-01 LAB — HEPATIC FUNCTION PANEL
ALT: 15 U/L (ref 0–44)
AST: 14 U/L — ABNORMAL LOW (ref 15–41)
Albumin: 2.9 g/dL — ABNORMAL LOW (ref 3.5–5.0)
Alkaline Phosphatase: 52 U/L (ref 38–126)
Bilirubin, Direct: <0.1 mg/dL (ref 0.0–0.2)
Total Bilirubin: 0.4 mg/dL (ref 0.0–1.2)
Total Protein: 5.5 g/dL — ABNORMAL LOW (ref 6.5–8.1)

## 2023-12-01 LAB — PHOSPHORUS: Phosphorus: 3.4 mg/dL (ref 2.5–4.6)

## 2023-12-01 LAB — CBG MONITORING, ED
Glucose-Capillary: 111 mg/dL — ABNORMAL HIGH (ref 70–99)
Glucose-Capillary: 110 mg/dL — ABNORMAL HIGH (ref 70–99)
Glucose-Capillary: 123 mg/dL — ABNORMAL HIGH (ref 70–99)

## 2023-12-01 LAB — COMPREHENSIVE METABOLIC PANEL
ALT: 15 U/L (ref 0–44)
AST: 16 U/L (ref 15–41)
Albumin: 3 g/dL — ABNORMAL LOW (ref 3.5–5.0)
Alkaline Phosphatase: 54 U/L (ref 38–126)
Anion gap: 6 (ref 5–15)
BUN: 19 mg/dL (ref 8–23)
CO2: 27 mmol/L (ref 22–32)
Calcium: 9 mg/dL (ref 8.9–10.3)
Chloride: 110 mmol/L (ref 98–111)
Creatinine, Ser: 1.68 mg/dL — ABNORMAL HIGH (ref 0.44–1.00)
GFR, Estimated: 30 mL/min — ABNORMAL LOW (ref >60)
Glucose, Bld: 134 mg/dL — ABNORMAL HIGH (ref 70–99)
Potassium: 3.5 mmol/L (ref 3.5–5.1)
Sodium: 143 mmol/L (ref 135–145)
Total Bilirubin: 0.5 mg/dL (ref 0.0–1.2)
Total Protein: 5.6 g/dL — ABNORMAL LOW (ref 6.5–8.1)

## 2023-12-01 LAB — IRON AND TIBC
Iron: 47 ug/dL (ref 28–170)
Saturation Ratios: 21 % (ref 10.4–31.8)
TIBC: 224 ug/dL — ABNORMAL LOW (ref 250–450)
UIBC: 177 ug/dL

## 2023-12-01 LAB — RETICULOCYTES
Immature Retic Fract: 6.6 % (ref 2.3–15.9)
RBC.: 3.86 MIL/uL — ABNORMAL LOW (ref 3.87–5.11)
Retic Count, Absolute: 57.9 10*3/uL (ref 19.0–186.0)
Retic Ct Pct: 1.5 % (ref 0.4–3.1)

## 2023-12-01 LAB — CBC
HCT: 33.8 % — ABNORMAL LOW (ref 36.0–46.0)
Hemoglobin: 11.4 g/dL — ABNORMAL LOW (ref 12.0–15.0)
MCH: 29.2 pg (ref 26.0–34.0)
MCHC: 33.7 g/dL (ref 30.0–36.0)
MCV: 86.4 fL (ref 80.0–100.0)
Platelets: 173 10*3/uL (ref 150–400)
RBC: 3.91 MIL/uL (ref 3.87–5.11)
RDW: 13.3 % (ref 11.5–15.5)
WBC: 5.8 10*3/uL (ref 4.0–10.5)
nRBC: 0 % (ref 0.0–0.2)

## 2023-12-01 LAB — VITAMIN B12: Vitamin B-12: 456 pg/mL (ref 180–914)

## 2023-12-01 LAB — CK: Total CK: 57 U/L (ref 38–234)

## 2023-12-01 LAB — PREALBUMIN: Prealbumin: 26 mg/dL (ref 18–38)

## 2023-12-01 LAB — FERRITIN: Ferritin: 134 ng/mL (ref 11–307)

## 2023-12-01 LAB — TSH: TSH: 0.824 u[IU]/mL (ref 0.350–4.500)

## 2023-12-01 LAB — FOLATE: Folate: 4.8 ng/mL — ABNORMAL LOW (ref >5.9)

## 2023-12-01 LAB — GLUCOSE, CAPILLARY
Glucose-Capillary: 131 mg/dL — ABNORMAL HIGH (ref 70–99)
Glucose-Capillary: 191 mg/dL — ABNORMAL HIGH (ref 70–99)

## 2023-12-01 LAB — MAGNESIUM: Magnesium: 1.5 mg/dL — ABNORMAL LOW (ref 1.7–2.4)

## 2023-12-01 MED ORDER — ATORVASTATIN CALCIUM 80 MG PO TABS
80.0000 mg | ORAL_TABLET | Freq: Every day | ORAL | Status: DC
  Administered 2023-12-02: 80 mg via ORAL
  Filled 2023-12-01: qty 1

## 2023-12-01 MED ORDER — ONDANSETRON HCL 4 MG/2ML IJ SOLN: 4.0000 mg | Freq: Four times a day (QID) | INTRAMUSCULAR | Status: DC | PRN

## 2023-12-01 MED ORDER — DOCUSATE SODIUM 100 MG PO CAPS
100.0000 mg | ORAL_CAPSULE | Freq: Two times a day (BID) | ORAL | Status: DC
  Administered 2023-12-01 – 2023-12-02 (×3): 100 mg via ORAL
  Filled 2023-12-01 (×3): qty 1

## 2023-12-01 MED ORDER — ONDANSETRON HCL 4 MG PO TABS: 4.0000 mg | ORAL_TABLET | Freq: Four times a day (QID) | ORAL | Status: DC | PRN

## 2023-12-01 MED ORDER — INSULIN GLARGINE-YFGN 100 UNIT/ML ~~LOC~~ SOLN
10.0000 [IU] | Freq: Every day | SUBCUTANEOUS | Status: DC
  Administered 2023-12-01 (×2): 10 [IU] via SUBCUTANEOUS
  Filled 2023-12-01 (×3): qty 0.1

## 2023-12-01 MED ORDER — POLYETHYLENE GLYCOL 3350 17 G PO PACK: 17.0000 g | PACK | Freq: Every day | ORAL | Status: DC | PRN

## 2023-12-01 MED ORDER — ACETAMINOPHEN 650 MG RE SUPP
650.0000 mg | Freq: Four times a day (QID) | RECTAL | Status: DC | PRN
Start: 2023-12-01 — End: 2023-12-02

## 2023-12-01 MED ORDER — ALBUTEROL SULFATE (2.5 MG/3ML) 0.083% IN NEBU: 2.5000 mg | INHALATION_SOLUTION | RESPIRATORY_TRACT | Status: DC | PRN

## 2023-12-01 MED ORDER — ASPIRIN 81 MG PO TBEC
81.0000 mg | DELAYED_RELEASE_TABLET | Freq: Every day | ORAL | Status: DC
  Administered 2023-12-01 – 2023-12-02 (×2): 81 mg via ORAL
  Filled 2023-12-01 (×2): qty 1

## 2023-12-01 MED ORDER — MAGNESIUM SULFATE 2 GM/50ML IV SOLN
2.0000 g | Freq: Once | INTRAVENOUS | Status: AC
  Administered 2023-12-01: 2 g via INTRAVENOUS
  Filled 2023-12-01: qty 50

## 2023-12-01 MED ORDER — SERTRALINE HCL 50 MG PO TABS
50.0000 mg | ORAL_TABLET | Freq: Every day | ORAL | Status: DC
  Administered 2023-12-01 (×2): 50 mg via ORAL
  Filled 2023-12-01 (×2): qty 1

## 2023-12-01 MED ORDER — SODIUM CHLORIDE 0.9 % IV SOLN
INTRAVENOUS | Status: AC
Start: 2023-12-01 — End: 2023-12-01

## 2023-12-01 MED ORDER — ATORVASTATIN CALCIUM 40 MG PO TABS
40.0000 mg | ORAL_TABLET | Freq: Every day | ORAL | Status: DC
  Administered 2023-12-01: 40 mg via ORAL
  Filled 2023-12-01: qty 1

## 2023-12-01 MED ORDER — ARIPIPRAZOLE 2 MG PO TABS
2.0000 mg | ORAL_TABLET | Freq: Every day | ORAL | Status: DC
  Administered 2023-12-01 – 2023-12-02 (×2): 2 mg via ORAL
  Filled 2023-12-01 (×2): qty 1

## 2023-12-01 MED ORDER — AMLODIPINE BESYLATE 5 MG PO TABS
5.0000 mg | ORAL_TABLET | Freq: Every day | ORAL | Status: DC
  Administered 2023-12-01 – 2023-12-02 (×2): 5 mg via ORAL
  Filled 2023-12-01 (×2): qty 1

## 2023-12-01 MED ORDER — ACETAMINOPHEN 325 MG PO TABS: 650.0000 mg | ORAL_TABLET | Freq: Four times a day (QID) | ORAL | Status: DC | PRN

## 2023-12-01 MED ORDER — HYDROCODONE-ACETAMINOPHEN 5-325 MG PO TABS: 1.0000 | ORAL_TABLET | ORAL | Status: DC | PRN

## 2023-12-01 MED ORDER — HYDRALAZINE HCL 25 MG PO TABS
25.0000 mg | ORAL_TABLET | Freq: Four times a day (QID) | ORAL | Status: DC | PRN
Start: 2023-12-01 — End: 2023-12-02
  Administered 2023-12-01 – 2023-12-02 (×3): 25 mg via ORAL
  Filled 2023-12-01 (×3): qty 1

## 2023-12-01 MED ORDER — INSULIN ASPART 100 UNIT/ML IJ SOLN
0.0000 [IU] | Freq: Three times a day (TID) | INTRAMUSCULAR | Status: DC
Start: 2023-12-02 — End: 2023-12-02

## 2023-12-01 MED ORDER — LEVOTHYROXINE SODIUM 88 MCG PO TABS
88.0000 ug | ORAL_TABLET | Freq: Every day | ORAL | Status: DC
  Administered 2023-12-01 – 2023-12-02 (×2): 88 ug via ORAL
  Filled 2023-12-01 (×2): qty 1

## 2023-12-01 NOTE — ED Notes (Signed)
 Patient was feed a cup of applesauce and she drink a sip of cranberry juice, a for snack.

## 2023-12-01 NOTE — Progress Notes (Signed)
 PROGRESS NOTE    Diana Harper  FMW:[id number] DOB: Apr 09, 1936 DOA: 11/30/2023 PCP: Diana Almarie SAUNDERS, MD  Outpatient Specialists:     Brief Narrative:  Patient is an 88 year old African-American female with past medical history significant for vasovagal syncope, hypertension, hyperlipidemia, chronic diastolic CHF, diabetes mellitus type 2, hypothyroidism, history of CVA, dementia, chronic anemia and CKD stage IV.  Patient was admitted with syncope and hypertensive urgency.  Systolic blood pressure of 226 mmHg was documented at some point.  Cardiology input is appreciated.  Recent echo only revealed grade 1 diastolic dysfunction, with normal estimated left ventricular ejection fraction and small pericardial effusion.  12/01/2023: Patient seen alongside patient's nurse.  2 daughters updated (1 was physically present in [place]  over the phone).  Patient is not able to give any significant history.  Patient is currently comfortable and eating.   Assessment & Plan:   Principal Problem:   Syncope, vasovagal Active Problems:   History of CVA (cerebrovascular accident)   Hypothyroidism   Uncontrolled type 2 diabetes mellitus with hyperglycemia, without long-term current use of insulin  (HCC)   Anemia   CKD (chronic kidney disease), stage IV (HCC)   Present on Admission:  Syncope, vasovagal  CKD (chronic kidney disease), stage IV (HCC)  Hypothyroidism  Uncontrolled type 2 diabetes mellitus with hyperglycemia, without long-term current use of insulin  (HCC)  Anemia     CKD (chronic kidney disease), stage IV (HCC) -CKD is stable. -Continue to monitor closely. -Avoid nephrotoxins. -Optimize blood pressure control gradually.   History of CVA (cerebrovascular accident) Continue aspirin  and statin Stable.   Hypothyroidism -TSH done earlier today was 0.824. Synthroid  at 88 mcg po q day     Uncontrolled type 2 diabetes mellitus with hyperglycemia, without long-term current use of  insulin  (HCC) -Blood sugars controlled. -Continue sliding scale insulin  coverage.      Anemia Hemoglobin is stable.    DVT prophylaxis:  Code Status: Full code Family Communication: 2 daughters Disposition Plan: Likely discharge home once cleared for discharge by the cardiology team   Consultants:  Cardiology  Procedures:  None  Antimicrobials:  None   Subjective: No history from patient. Patient is a poor historian.  Objective: Vitals:   12/01/23 1400 12/01/23 1436 12/01/23 1500 12/01/23 1610  BP: (!) 171/91 (!) 187/93 (!) 183/71 (!) 128/56  Pulse:  70 66   Resp: 17 19 18 15   Temp:  98.2 F (36.8 C)    TempSrc:  Oral    SpO2:  100% 99%   Weight:  58.9 kg    Height:        Intake/Output Summary (Last 24 hours) at 12/01/2023 1701 Last data filed at 12/01/2023 0646 Gross per 24 hour  Intake --  Output 4 ml  Net -4 ml   Filed Weights   11/30/23 1437 12/01/23 1436  Weight: 59.9 kg 58.9 kg    Examination:  General exam: Appears calm and comfortable  Respiratory system: Clear to auscultation.  Cardiovascular system: S1 & S2, with ejection systolic murmur.   Gastrointestinal system: Abdomen is soft and nontender. Central nervous system: Awake and alert, demented. Extremities: No leg edema.  Data Reviewed: I have personally reviewed following labs and imaging studies  CBC: Recent Labs  Lab 11/30/23 1544 12/01/23 0359  WBC 7.1 5.8  HGB 10.5* 11.4*  HCT 31.3* 33.8*  MCV 88.4 86.4  PLT 217 173   Basic Metabolic Panel: Recent Labs  Lab 11/30/23 1544 12/01/23 0359  NA 142 143  K 3.8  3.5  CL 110 110  CO2 22 27  GLUCOSE 165* 134*  BUN 23 19  CREATININE 1.73* 1.68*  CALCIUM  9.3 9.0  MG  --  1.5*  PHOS  --  3.4   GFR: Estimated Creatinine Clearance: 22.8 mL/min (A) (by C-G formula based on SCr of 1.68 mg/dL (H)). Liver Function Tests: Recent Labs  Lab 12/01/23 0359  AST 14*  16  ALT 15  15  ALKPHOS 52  54  BILITOT 0.4  0.5  PROT 5.5*   5.6*  ALBUMIN  2.9*  3.0*   No results for input(s): LIPASE, AMYLASE in the last 168 hours. No results for input(s): AMMONIA in the last 168 hours. Coagulation Profile: No results for input(s): INR, PROTIME in the last 168 hours. Cardiac Enzymes: Recent Labs  Lab 12/01/23 0359  CKTOTAL 57   BNP (last 3 results) No results for input(s): PROBNP in the last 8760 hours. HbA1C: No results for input(s): HGBA1C in the last 72 hours. CBG: Recent Labs  Lab 11/30/23 2354 12/01/23 0437 12/01/23 0745 12/01/23 1155 12/01/23 1610  GLUCAP 122* 111* 110* 123* 131*   Lipid Profile: No results for input(s): CHOL, HDL, LDLCALC, TRIG, CHOLHDL, LDLDIRECT in the last 72 hours. Thyroid  Function Tests: Recent Labs    12/01/23 0359  TSH 0.824   Anemia Panel: Recent Labs    12/01/23 0359 12/01/23 0400  VITAMINB12  --  456  FOLATE 4.8*  --   FERRITIN 134  --   TIBC 224*  --   IRON 47  --   RETICCTPCT 1.5  --    Urine analysis:    Component Value Date/Time   COLORURINE YELLOW 12/01/2023 0029   APPEARANCEUR CLEAR 12/01/2023 0029   LABSPEC 1.013 12/01/2023 0029   PHURINE 6.0 12/01/2023 0029   GLUCOSEU NEGATIVE 12/01/2023 0029   HGBUR NEGATIVE 12/01/2023 0029   BILIRUBINUR NEGATIVE 12/01/2023 0029   KETONESUR NEGATIVE 12/01/2023 0029   PROTEINUR >=300 (A) 12/01/2023 0029   UROBILINOGEN 0.2 04/15/2012 1217   NITRITE POSITIVE (A) 12/01/2023 0029   LEUKOCYTESUR TRACE (A) 12/01/2023 0029   Sepsis Labs: @LABRCNTIP (procalcitonin:4,lacticidven:4)  )No results found for this or any previous visit (from the past 240 hours).       Radiology Studies: VAS US  CAROTID Result Date: 12/01/2023 Carotid Arterial Duplex Study Patient Name:  Diana Harper  Date of Exam:   12/01/2023 Medical Rec #: [id number]       Accession #:    [id number] Date of Birth: 04-18-1936      Patient Gender: F Patient Age:   49 years Exam Location:  [facility] Procedure:      VAS  US  CAROTID Referring Phys: Diana Harper --------------------------------------------------------------------------------  Indications:      CVA and Syncope. Risk Factors:     Hypertension, hyperlipidemia, Diabetes, coronary artery                   disease. Comparison Study: Previous study on 11.13.2024. Performing Technologist: Diana Harper  Examination Guidelines: A complete evaluation includes B-mode imaging, spectral Doppler, color Doppler, and power Doppler as needed of all accessible portions of each vessel. Bilateral testing is considered an integral part of a complete examination. Limited examinations for reoccurring indications may be performed as noted.  Right Carotid Findings: +----------+--------+--------+--------+------------------+--------+           PSV cm/sEDV cm/sStenosisPlaque DescriptionComments +----------+--------+--------+--------+------------------+--------+ CCA Prox  123     11                                         +----------+--------+--------+--------+------------------+--------+  CCA Distal72      9                                          +----------+--------+--------+--------+------------------+--------+ ICA Prox  81      12                                         +----------+--------+--------+--------+------------------+--------+ ICA Mid   52      10                                         +----------+--------+--------+--------+------------------+--------+ ICA Distal56      9                                          +----------+--------+--------+--------+------------------+--------+ ECA       46      8                                          +----------+--------+--------+--------+------------------+--------+ +----------+--------+-------+--------+-------------------+           PSV cm/sEDV cmsDescribeArm Pressure (mmHG) +----------+--------+-------+--------+-------------------+ Dlarojcpjw24      9                                   +----------+--------+-------+--------+-------------------+ +---------+--------+--+--------+--+ VertebralPSV cm/s55EDV cm/s10 +---------+--------+--+--------+--+  Left Carotid Findings: +----------+--------+--------+--------+------------------+--------+           PSV cm/sEDV cm/sStenosisPlaque DescriptionComments +----------+--------+--------+--------+------------------+--------+ CCA Prox  123     13                                         +----------+--------+--------+--------+------------------+--------+ CCA Distal91      16                                         +----------+--------+--------+--------+------------------+--------+ ICA Prox  78      10                                         +----------+--------+--------+--------+------------------+--------+ ICA Mid   93      21                                tortuous +----------+--------+--------+--------+------------------+--------+ ICA Distal84      17                                tortuous +----------+--------+--------+--------+------------------+--------+ ECA       54      6                                          +----------+--------+--------+--------+------------------+--------+ +----------+--------+--------+--------+-------------------+  PSV cm/sEDV cm/sDescribeArm Pressure (mmHG) +----------+--------+--------+--------+-------------------+ Subclavian119     14                                  +----------+--------+--------+--------+-------------------+ +---------+--------+--+--------+--+ VertebralPSV cm/s71EDV cm/s12 +---------+--------+--+--------+--+   Summary: Right Carotid: Velocities in the right ICA are consistent with a 1-39% stenosis. Left Carotid: Velocities in the left ICA are consistent with a 1-39% stenosis. Vertebrals:  Bilateral vertebral arteries demonstrate antegrade flow. Subclavians: Normal flow hemodynamics were seen in bilateral subclavian               arteries. *See table(s) above for measurements and observations.  Electronically signed by Lonni Gaskins MD on 12/01/2023 at 4:54:38 PM.    Final    DG Abd 1 View Result Date: 12/01/2023 CLINICAL DATA:  Constipation EXAM: ABDOMEN - 1 VIEW COMPARISON:  06/22/2016 FINDINGS: Scattered large and small bowel gas is noted. Mild degenerative changes of lumbar spine are seen. No obstructive changes noted. No free air is seen. IMPRESSION: No acute abnormality noted. Electronically Signed   By: Oneil Devonshire M.D.   On: 12/01/2023 00:11   DG Chest 2 View Result Date: 11/30/2023 CLINICAL DATA:  Syncope. EXAM: CHEST - 2 VIEW COMPARISON:  Chest radiograph dated 10/08/2023. FINDINGS: The heart size and mediastinal contours are within normal limits. There is mild left basilar atelectasis/airspace disease. The right lung is clear. No pleural effusion or pneumothorax. Degenerative changes are seen in the spine. IMPRESSION: Mild left basilar atelectasis/airspace disease. Electronically Signed   By: Norman Hopper M.D.   On: 11/30/2023 16:44   CT Head Wo Contrast Result Date: 11/30/2023 CLINICAL DATA:  Altered mental status, loss of consciousness. EXAM: CT HEAD WITHOUT CONTRAST TECHNIQUE: Contiguous axial images were obtained from the base of the skull through the vertex without intravenous contrast. RADIATION DOSE REDUCTION: This exam was performed according to the departmental dose-optimization program which includes automated exposure control, adjustment of the mA and/or kV according to patient size and/or use of iterative reconstruction technique. COMPARISON:  CT head dated 10/08/2023. FINDINGS: Brain: No evidence of acute infarction, hemorrhage, hydrocephalus, extra-axial collection or mass lesion/mass effect. There is mild cerebral volume loss with associated ex vacuo dilatation. Periventricular white matter hypoattenuation likely represents chronic small vessel ischemic disease. Chronic lacunar infarcts  involving the right basal ganglia, left thalamus, and right corona radiata. Vascular: There are vascular calcifications in the carotid siphons. Skull: Normal. Negative for fracture or focal lesion. Sinuses/Orbits: There is a left sphenoid sinus mucous retention cyst versus polyp. Other: None. IMPRESSION: No acute intracranial process. Electronically Signed   By: Norman Hopper M.D.   On: 11/30/2023 16:43        Scheduled Meds:  amLODipine   5 mg Oral Daily   ARIPiprazole   2 mg Oral Daily   aspirin  EC  81 mg Oral Daily   [START ON 12/02/2023] atorvastatin   80 mg Oral Daily   docusate sodium   100 mg Oral BID   insulin  aspart  0-9 Units Subcutaneous Q4H   insulin  glargine-yfgn  10 Units Subcutaneous QHS   levothyroxine   88 mcg Oral QAC breakfast   sertraline   50 mg Oral QHS   Continuous Infusions:   LOS: 0 days    Time spent: 35 minutes    Leatrice Chapel, MD  [facility] Pager #: [phone] 7PM-7AM contact night coverage as above

## 2023-12-01 NOTE — ED Notes (Signed)
 1 continent episode of urine - pericare and pad changed

## 2023-12-01 NOTE — ED Notes (Signed)
 1 incontinent urine episode - pericare and pad change provided

## 2023-12-01 NOTE — Consult Note (Addendum)
 Cardiology Consultation   Patient ID: Diana Harper MRN: [id number]; DOB: 17-Apr-1936  Admit date: 11/30/2023 Date of Consult: 12/01/2023  PCP:  Waylan Almarie SAUNDERS, MD   [facility] Providers Cardiologist:  Gordy Bergamo, MD     Patient Profile:   Diana Harper is a 88 y.o. female with a hx of dementia, type 2 DM, history of CVA, vitamin D  deficiency, diastolic heart failure, diabetic polyneuropathy, stage IV chronic kidney disease who is being seen 12/01/2023 for the evaluation of syncope at the request of Dr. Rosario  History of Present Illness:   Diana Harper is an 88 year old female with above medical history who is followed by Dr. Bergamo.   Per chart review, patient previously had a TIA in 2019. Later underwent nuclear stress test in 12/2020 that was a low risk study with normal myocardial perfusion with stress EF 56%. Echocardiogram on 02/03/21 showed EF 55-60%, mild LVH, normal global wall motion, grade I DD. Carotid artery ultrasounds on 02/03/21 showed minimal plaque noted in the bilateral common carotid arteries, no hemodynamically significant stenosis in bilateral ICAs. Later, echocardiogram from 09/14/22 showed EF 60-65%, no regional wall motion abnormalities, grade I DD, normal RV function.   Patient was admitted in 03/2023 and was found to have an acute CVA. Underwent carotid ultrasounds on 04/19/23 that showed near normal extracranial vessels with only minimal wall thickening or plaque. Echocardiogram on 04/19/23 showed EF 60-65%, no regional wall motion abnormalities, grade I DD, normal RV function. A cardiac monitor was ordered, but never completed. Last seen by Dr. Bergamo on 05/04/23. At that time, patient had generalized weakness and could not walk more than a few feet. Mostly used a wheelchair or walked with walker. Remained on lipitor . Discussed loop recorder and TEE, but patient wanted more time to think before she made a decision.   Patient admitted from 11/9-11/15 with altered  mental status. Found to be dehydrated and was treated with IV fluids. While admitted, she developed difficulty speaking. She underwent MRI that showed embolic CVAs. Treated with DAPT for 3 weeks, then ASA alone.   Patient presented to the ED on 11/30/23 after she had a syncopal episode while planning her husband's funeral. Her family witnessed the event and started CPR. When EMS arrived, patient had BP 167/78, HR 58 BPM, RR 19.  Labs in the ED showed creatinine 1.73, K 3.8, hsTn 12>12. CXR showed mild left basilar atelectasis/airspace disease. Abdominal x-ray showed no acute abnormalities.   Patient was admitted tot he hospitalist service for treatment of syncope. Suspected to have been vasovagal. Orthostatic vital signs showed drop in BP from 176/87 while lying to 158/99 sitting.   Cardiology consulted for further evaluation of syncope. On interview, patient is confused. Tells me that the year is 2002. Also answers 2002 when asked what month it is, what state we are in, and what building we are in. She is unable to tell me why she is in the hospital   Past Medical History:  Diagnosis Date   Anemia    Anxiety    Arthritis    Asthma    Biliary colic    C. difficile colitis 08/2022   Cholelithiasis    CKD (chronic kidney disease), stage III [facility])    nephrologist--  dr oliver   Colon polyps    Diverticulosis of colon    Gastric ulcer    Glaucoma    History of asthma    1980's  no longer problem since 1980's  History of diverticulitis of colon    recurrent--  2014;  2013;  2012   Hyperlipidemia    Hypertension    Hypothyroidism    Insulin  dependent type 2 diabetes mellitus (HCC)    followed by dr norleen lipoma ([facility])    Murmur    Peripheral neuropathy    Stroke [facility])    Vitamin D  deficiency    Wears hearing aid    bilateral   Wears partial dentures    lower partial and upper full    Past Surgical History:  Procedure Laterality Date   ABDOMINAL HYSTERECTOMY  1970's    CARDIOVASCULAR STRESS TEST  07/30/2009   normal exercise lexiscan  nuclear study w/ no ischemia/  normal LV funciton and wall motion, ef 77%   CATARACT EXTRACTION W/ INTRAOCULAR LENS  IMPLANT, BILATERAL  2014   CATARACT EXTRACTION, BILATERAL Bilateral 2021   LAPAROSCOPIC CHOLECYSTECTOMY SINGLE SITE WITH INTRAOPERATIVE CHOLANGIOGRAM N/A 08/25/2016   Procedure: LAPAROSCOPIC CHOLECYSTECTOMY WITH INTRAOPERATIVE CHOLANGIOGRAM;  Surgeon: Herlene Beverley Bureau, MD;  Location: [facility] [place];  Service: General;  Laterality: N/A;   LUMBAR SPINE SURGERY  2014   REMOVAL AXILLA CYST Right 1990's     Home Medications:  Prior to Admission medications   Medication Sig Start Date End Date Taking? Authorizing Provider  acetaminophen  (TYLENOL ) 500 MG tablet Take 1,000 mg by mouth every 8 (eight) hours as needed for mild pain, headache or fever.   Yes [provider]  albuterol  (VENTOLIN  HFA) 108 (90 Base) MCG/ACT inhaler Inhale 2 puffs into the lungs every 4 (four) hours as needed for wheezing or shortness of breath.   Yes [provider]  amLODipine  (NORVASC ) 5 MG tablet Take 1 tablet (5 mg total) by mouth daily. 10/14/23  Yes Vann, Jessica U, DO  ARIPiprazole  (ABILIFY ) 2 MG tablet Take 2 mg by mouth daily.   Yes [provider]  aspirin  EC 81 MG tablet Take 1 tablet (81 mg total) by mouth daily. Swallow whole. Patient taking differently: Take 81 mg by mouth as needed. Swallow whole. 10/15/23  Yes Vann, Jessica U, DO  atorvastatin  (LIPITOR ) 40 MG tablet Take 1 tablet (40 mg total) by mouth daily. 10/15/23  Yes Vann, Jessica U, DO  Cholecalciferol  (VITAMIN D3) 125 MCG (5000 UT) CAPS Take 5,000 Units by mouth daily.   Yes [provider]  diclofenac Sodium (VOLTAREN) 1 % GEL Apply 2 g topically 4 (four) times daily as needed (joint pain).   Yes [provider]  Glucagon HCl 1 MG SOLR Inject 1 mg into the skin as needed (for a blood sugar <67).   Yes [provider]  insulin  degludec (TRESIBA ) 200 UNIT/ML FlexTouch Pen Inject 16 Units into the skin See admin instructions. 50 units SQ once daily if BG is greater than 150 10/14/23  Yes Vann, Jessica U, DO  levothyroxine  (SYNTHROID ) 88 MCG tablet Take 88 mcg by mouth daily before breakfast.   Yes [provider]  nitroGLYCERIN  (NITROSTAT ) 0.4 MG SL tablet Place 1 tablet (0.4 mg total) under the tongue every 5 (five) minutes as needed for chest pain. 01/20/21 10/07/24 Yes Cantwell, Celeste C, PA-C  NOVOLOG  FLEXPEN 100 UNIT/ML FlexPen Inject 5-10 Units into the skin See admin instructions. Inject 5 units into the skin before meals if BGL is 100-150, 7 units if BGL is 151-200, and 10 units if BGL is greater than 200 05/21/22  Yes [provider]  sertraline  (ZOLOFT ) 50 MG tablet Take 50 mg by  mouth at bedtime.   Yes [provider]  carvedilol  (COREG ) 6.25 MG tablet Take 1 tablet (6.25 mg total) by mouth 2 (two) times daily with a meal. Patient not taking: Reported on 11/16/2023 10/14/23   Vann, Jessica U, DO  Continuous Blood Gluc Receiver (FREESTYLE LIBRE 2 READER) DEVI See admin instructions. 10/13/22   [provider]  Continuous Blood Gluc Sensor (FREESTYLE LIBRE 14 DAY SENSOR) MISC Inject 1 Device into the skin every 14 (fourteen) days.    [provider]  D-Mannose 500 MG CAPS Take 500 mg by mouth daily. Patient not taking: Reported on 11/16/2023    [provider]  L-Theanine 100 MG CAPS Take 100 mg by mouth daily. Patient not taking: Reported on 11/16/2023    [provider]    Inpatient Medications: Scheduled Meds:  ARIPiprazole   2 mg Oral Daily   aspirin  EC  81 mg Oral Daily   atorvastatin   40 mg Oral Daily   docusate sodium   100 mg Oral BID   insulin  aspart  0-9 Units Subcutaneous Q4H   insulin  glargine-yfgn  10 Units Subcutaneous QHS   levothyroxine   88 mcg Oral QAC breakfast   sertraline   50 mg Oral QHS   Continuous  Infusions:  magnesium  sulfate bolus IVPB     PRN Meds: acetaminophen  **OR** acetaminophen , albuterol , hydrALAZINE , HYDROcodone -acetaminophen , ondansetron  **OR** ondansetron  (ZOFRAN ) IV, polyethylene glycol  Allergies:    Allergies  Allergen Reactions   Latex Anaphylaxis, Shortness Of Breath and Other (See Comments)    Severe respiratory distress   Onion Diarrhea   Other Other (See Comments)    Patient cannot have TREE NUTS and anything with seeds- History of  diverticulitis    Flexeril  [Cyclobenzaprine ] Other (See Comments)    Caused agitation   Lisinopril Cough   Metformin And Related Diarrhea   Simvastatin Hives and Rash   Tetracycline Rash   Zithromax [Azithromycin] Rash    Social History:   Social History   Socioeconomic History   Marital status: Widowed    Spouse name: Not on file   Number of children: 4   Years of education: Not on file   Highest education level: Not on file  Occupational History   Occupation: retired  Tobacco Use   Smoking status: Never   Smokeless tobacco: Never  Vaping Use   Vaping status: Never Used  Substance and Sexual Activity   Alcohol  use: No   Drug use: No   Sexual activity: Not on file  Other Topics Concern   Not on file  Social History Narrative   Are you right handed or left handed? Right   Are you currently employed ?    What is your current occupation? retired   Do you live at home alone?   Who lives with you? Daughter   What type of home do you live in: 1 story or 2 story? one   No Caffiene     Social Drivers of Corporate Investment Banker Strain: Not on file  Food Insecurity: No Food Insecurity (10/08/2023)   Hunger Vital Sign    Worried About Running Out of Food in the Last Year: Never true    Ran Out of Food in the Last Year: Never true  Transportation Needs: No Transportation Needs (10/08/2023)   PRAPARE - Administrator, Civil Service (Medical): No    Lack of Transportation (Non-Medical): No   Physical Activity: Not on file  Stress: Not on file  Social  Connections: Unknown (03/28/2022)   [facility]   Social Network    Social Network: Not on file  Intimate Partner Violence: Not At Risk (10/08/2023)   Humiliation, Afraid, Rape, and Kick questionnaire    Fear of Current or Ex-Partner: No    Emotionally Abused: No    Physically Abused: No    Sexually Abused: No    Family History:    Family History  Problem Relation Age of Onset   Stroke Mother    Colon polyps Mother    Diabetes Mother    Diabetes Father    Kidney failure Father    COPD Father    Colon polyps Sister    Diabetes Sister    Stroke Brother    Diabetes Brother    Heart attack Maternal Grandmother    Heart disease Maternal Grandfather    Diabetes Paternal Grandmother    Stroke Paternal Grandfather    Hypertension Paternal Grandfather    Diabetes Sister    Diabetes Sister    Diabetes Son    Diabetes Daughter    Colon cancer Neg Hx    Esophageal cancer Neg Hx    Inflammatory bowel disease Neg Hx    Liver disease Neg Hx    Pancreatic cancer Neg Hx    Rectal cancer Neg Hx    Stomach cancer Neg Hx      ROS:  Please see the history of present illness.   All other ROS reviewed and negative.     Physical Exam/Data:   Vitals:   12/01/23 0950 12/01/23 1315 12/01/23 1400 12/01/23 1436  BP:  (!) 170/79 (!) 171/91 (!) 187/93  Pulse:  62  70  Resp:  17 17 19   Temp: 97.7 F (36.5 C) 98.4 F (36.9 C)  98.2 F (36.8 C)  TempSrc: Oral Oral  Oral  SpO2:  100%  100%  Weight:    58.9 kg  Height:        Intake/Output Summary (Last 24 hours) at 12/01/2023 1446 Last data filed at 12/01/2023 0646 Gross per 24 hour  Intake --  Output 4 ml  Net -4 ml      12/01/2023    2:36 PM 11/30/2023    2:37 PM 11/16/2023    2:56 PM  Last 3 Weights  Weight (lbs) 129 lb 13.6 oz 132 lb 0.9 oz 132 lb  Weight (kg) 58.9 kg 59.9 kg 59.875 kg     Body mass index is 20.96 kg/m.  General:   Thin, elderly female. Sleeping, arouses easily to voice  HEENT: normal Neck: no JVD Vascular: Radial pulses 2+ bilaterally Cardiac:  normal S1, S2; RRR; no murmur   Lungs:  Breathing unlabored  Abd: soft, nontender  Ext: no edema in BLE Skin: warm and dry  Neuro:  no focal abnormalities noted Psych:  Normal affect   EKG:  The EKG was personally reviewed and demonstrates:  NSR, HR 66 BPM  Telemetry:  Telemetry was personally reviewed and demonstrates:  NSR, HR in the 60s   Relevant CV Studies: Cardiac Studies & Procedures      ECHOCARDIOGRAM  ECHOCARDIOGRAM COMPLETE 10/12/2023  Narrative ECHOCARDIOGRAM REPORT    Patient Name:   Rifky Lapre Date of Exam: 10/12/2023 Medical Rec #:  [id number]      Height:       66.0 in Accession #:    [id number]     Weight:       136.7 lb Date of Birth:  [birth year]     BSA:          1.701 m Patient Age:    86 years       BP:           147/68 mmHg Patient Gender: F              HR:           68 bpm. Exam Location:  Inpatient  Procedure: 2D Echo, Cardiac Doppler and Color Doppler  Indications:    stroke  History:        Patient has prior history of Echocardiogram examinations, most recent 04/19/2023. Stroke; Risk Factors:Diabetes, Dyslipidemia and Hypertension.  Sonographer:    Melissa Morford RDCS (AE, PE) Referring Phys: ARY CUMMINS   Sonographer Comments: Image acquisition challenging due to uncooperative patient. IMPRESSIONS   1. Left ventricular ejection fraction, by estimation, is 55 to 60%. The left ventricle has normal function. The left ventricle has no regional wall motion abnormalities. Left ventricular diastolic parameters are consistent with Grade I diastolic dysfunction (impaired relaxation). 2. Right ventricular systolic function is normal. The right ventricular size is mildly enlarged. 3. Left atrial size was mildly dilated. 4. A small pericardial effusion is present. The pericardial effusion is posterior to the left  ventricle. 5. The mitral valve is grossly normal. No evidence of mitral valve regurgitation. No evidence of mitral stenosis. 6. The aortic valve is calcified. There is moderate calcification of the aortic valve. There is moderate thickening of the aortic valve. Aortic valve regurgitation is not visualized. Aortic valve sclerosis/calcification is present, without any evidence of aortic stenosis.  Conclusion(s)/Recommendation(s): No intracardiac source of embolism detected on this transthoracic study. Consider a transesophageal echocardiogram to exclude cardiac source of embolism if clinically indicated.  FINDINGS Left Ventricle: Left ventricular ejection fraction, by estimation, is 55 to 60%. The left ventricle has normal function. The left ventricle has no regional wall motion abnormalities. Definity  contrast agent was given IV to delineate the left ventricular endocardial borders. The left ventricular internal cavity size was normal in size. There is no left ventricular hypertrophy. Left ventricular diastolic parameters are consistent with Grade I diastolic dysfunction (impaired relaxation).  Right Ventricle: The right ventricular size is mildly enlarged. No increase in right ventricular wall thickness. Right ventricular systolic function is normal.  Left Atrium: Left atrial size was mildly dilated.  Right Atrium: Right atrial size was not well visualized.  Pericardium: A small pericardial effusion is present. The pericardial effusion is posterior to the left ventricle.  Mitral Valve: The mitral valve is grossly normal. No evidence of mitral valve regurgitation. No evidence of mitral valve stenosis.  Tricuspid Valve: The tricuspid valve is grossly normal. Tricuspid valve regurgitation is trivial. No evidence of tricuspid stenosis.  Aortic Valve: The aortic valve is calcified. There is moderate calcification of the aortic valve. There is moderate thickening of the aortic valve. Aortic valve  regurgitation is not visualized. Aortic valve sclerosis/calcification is present, without any evidence of aortic stenosis. Aortic valve mean gradient measures 9.5 mmHg. Aortic valve peak gradient measures 15.3 mmHg. Aortic valve area, by VTI measures 1.23 cm.  Pulmonic Valve: The pulmonic valve was grossly normal. Pulmonic valve regurgitation is trivial. No evidence of pulmonic stenosis.  Aorta: The aortic root and ascending aorta are structurally normal, with no evidence of dilitation.  Venous: The inferior vena cava was not well visualized.  IAS/Shunts: The interatrial septum was not well visualized.   LEFT VENTRICLE PLAX 2D LVIDd:  4.70 cm   Diastology LVIDs:         3.10 cm   LV e' medial:    3.92 cm/s LV PW:         1.10 cm   LV E/e' medial:  19.7 LV IVS:        1.00 cm   LV e' lateral:   3.37 cm/s LVOT diam:     1.90 cm   LV E/e' lateral: 22.9 LV SV:         49 LV SV Index:   29 LVOT Area:     2.84 cm   LEFT ATRIUM           Index LA diam:      2.90 cm 1.70 cm/m LA Vol (A4C): 58.9 ml 34.62 ml/m AORTIC VALVE AV Area (Vmax):    1.36 cm AV Area (Vmean):   1.24 cm AV Area (VTI):     1.23 cm AV Vmax:           195.50 cm/s AV Vmean:          145.500 cm/s AV VTI:            0.395 m AV Peak Grad:      15.3 mmHg AV Mean Grad:      9.5 mmHg LVOT Vmax:         93.80 cm/s LVOT Vmean:        63.800 cm/s LVOT VTI:          0.172 m LVOT/AV VTI ratio: 0.44  AORTA Ao Root diam: 2.90 cm Ao Asc diam:  3.00 cm  MITRAL VALVE                TRICUSPID VALVE MV Area (PHT): 5.16 cm     TR Peak grad:   16.6 mmHg MV Decel Time: 147 msec     TR Vmax:        204.00 cm/s MV E velocity: 77.10 cm/s MV A velocity: 101.00 cm/s  SHUNTS MV E/A ratio:  0.76         Systemic VTI:  0.17 m Systemic Diam: 1.90 cm  Darryle Decent MD Electronically signed by Darryle Decent MD Signature Date/Time: 10/12/2023/3:19:52 PM    Final              Laboratory Data:  High Sensitivity  Troponin:   Recent Labs  Lab 11/30/23 1544 11/30/23 1822  TROPONINIHS 12 12     Chemistry Recent Labs  Lab 11/30/23 1544 12/01/23 0359  NA 142 143  K 3.8 3.5  CL 110 110  CO2 22 27  GLUCOSE 165* 134*  BUN 23 19  CREATININE 1.73* 1.68*  CALCIUM  9.3 9.0  MG  --  1.5*  GFRNONAA 29* 30*  ANIONGAP 10 6    Recent Labs  Lab 12/01/23 0359  PROT 5.5*  5.6*  ALBUMIN  2.9*  3.0*  AST 14*  16  ALT 15  15  ALKPHOS 52  54  BILITOT 0.4  0.5   Lipids No results for input(s): CHOL, TRIG, HDL, LABVLDL, LDLCALC, CHOLHDL in the last 168 hours.  Hematology Recent Labs  Lab 11/30/23 1544 12/01/23 0359  WBC 7.1 5.8  RBC 3.54* 3.91  3.86*  HGB 10.5* 11.4*  HCT 31.3* 33.8*  MCV 88.4 86.4  MCH 29.7 29.2  MCHC 33.5 33.7  RDW 13.2 13.3  PLT 217 173   Thyroid   Recent Labs  Lab 12/01/23 0359  TSH 0.824  BNPNo results for input(s): BNP, PROBNP in the last 168 hours.  DDimer No results for input(s): DDIMER in the last 168 hours.   Radiology/Studies:  DG Abd 1 View Result Date: 12/01/2023 CLINICAL DATA:  Constipation EXAM: ABDOMEN - 1 VIEW COMPARISON:  06/22/2016 FINDINGS: Scattered large and small bowel gas is noted. Mild degenerative changes of lumbar spine are seen. No obstructive changes noted. No free air is seen. IMPRESSION: No acute abnormality noted. Electronically Signed   By: Oneil Devonshire M.D.   On: 12/01/2023 00:11   DG Chest 2 View Result Date: 11/30/2023 CLINICAL DATA:  Syncope. EXAM: CHEST - 2 VIEW COMPARISON:  Chest radiograph dated 10/08/2023. FINDINGS: The heart size and mediastinal contours are within normal limits. There is mild left basilar atelectasis/airspace disease. The right lung is clear. No pleural effusion or pneumothorax. Degenerative changes are seen in the spine. IMPRESSION: Mild left basilar atelectasis/airspace disease. Electronically Signed   By: Norman Hopper M.D.   On: 11/30/2023 16:44   CT Head Wo Contrast Result Date:  11/30/2023 CLINICAL DATA:  Altered mental status, loss of consciousness. EXAM: CT HEAD WITHOUT CONTRAST TECHNIQUE: Contiguous axial images were obtained from the base of the skull through the vertex without intravenous contrast. RADIATION DOSE REDUCTION: This exam was performed according to the departmental dose-optimization program which includes automated exposure control, adjustment of the mA and/or kV according to patient size and/or use of iterative reconstruction technique. COMPARISON:  CT head dated 10/08/2023. FINDINGS: Brain: No evidence of acute infarction, hemorrhage, hydrocephalus, extra-axial collection or mass lesion/mass effect. There is mild cerebral volume loss with associated ex vacuo dilatation. Periventricular white matter hypoattenuation likely represents chronic small vessel ischemic disease. Chronic lacunar infarcts involving the right basal ganglia, left thalamus, and right corona radiata. Vascular: There are vascular calcifications in the carotid siphons. Skull: Normal. Negative for fracture or focal lesion. Sinuses/Orbits: There is a left sphenoid sinus mucous retention cyst versus polyp. Other: None. IMPRESSION: No acute intracranial process. Electronically Signed   By: Norman Hopper M.D.   On: 11/30/2023 16:43     Assessment and Plan:   Syncope  - Patient presented after a witnessed syncopal episode at home after. She was sitting down, planning her husband's funeral  - Patient has dementia. Unable to explain the event. Unable to tell me why she is in the hospital  - hsTn 12, 12. Hemoglobin 11.4. Not hypotensive. Orthostatic vital signs this admission did show BP 176/87 lying, 158/99 sitting. Not a large enough drop to be classified as orthostatic.  - Recent echo from 09/2023 showed EF 55-60%, no regional wall motion abnormalities, grade I DD, normal RV function - Tele shows NSR, rare PVCs. No significant tachy or brady arrhthymias or pauses. She has had multiple strokes in the  past, but has never worn a cardiac monitor. Recommend 30 day monitor at DC (if patient can tolerate it, with her dementia may try to take it off. Will discuss further with family)  - Carotid ultrasounds pending   History of CVA  - Patient has had multiple CVAs in the past - Continue aspirin  81 mg daily, lipitor  40 mg daily   HLD  - Lipid panel from 09/2023 showed LDL 164  - Increase lipitor  to 80 mg daily  - May need [id number] with her history of strokes, high LDL   HTN  - BP elevated overnight- as high as 202/87 overnight  - Resume home amlodipine  5 mg daily   Otherwise per primary  - CKD  stage IV - Hypothyroidism  - Uncontrolled Type 2 DM  - Anemia   Risk Assessment/Risk Scores:    For questions or updates, please contact [facility] Please consult [email or web address] for contact info under    Signed, Rollo FABIENE Louder, PA-C  12/01/2023 2:46 PM  Patient seen and examined with Rollo Louder, PA-C.  Agree as above, with the following exceptions and changes as noted below.  Patient with dementia is dozing off while eating her dinner, daughter is at bedside and provides all history.  Patient experienced a syncopal episode but is unable to explain the event. Gen: NAD, CV: RRR, no murmurs, Lungs: clear, Abd: soft, Extrem: Warm, well perfused, no edema, Neuro/Psych: Drowsy, not communicative, unable to assess. All available labs, radiology testing, previous records reviewed.  Suspect syncope may have been related to stress and dehydration.  Recommend conservative management.  Event monitor to be placed prior to patient's dismissal from the hospital.  She has been started on a higher dose of her cholesterol therapy in the setting of prior strokes felt to be potentially related to atherosclerosis.  With strokes event monitor will be helpful.  Recent echocardiogram was grossly normal.  Discussed with Dr. Rosario, no further cardiovascular recommendations inpatient.  We will place  cardiac monitor tomorrow when EKG service is available and subsequently signed off.  Leesha Veno A Niemah Schwebke, MD 12/01/23 7:13 PM

## 2023-12-01 NOTE — ED Notes (Signed)
 Pt had incontinent episode of urine and bowel (soft, brown). Pericare, linen and pad change provided.

## 2023-12-01 NOTE — Evaluation (Signed)
 Occupational Therapy Evaluation Patient Details Name: Diana Harper MRN: [id number] DOB: 11-04-1936 Today's Date: 12/01/2023   History of Present Illness Pt is an 88 yo female who presents to [facility] from home with witnessed syncope. PMH PMH: prior CVA, type 2 diabetes, essential hypertension, hyperlipidemia, hypothyroidism, recent C. difficile colitis, dementia   Clinical Impression   Pt admitted for above, unsure of baseline cognition she needs increased time to sequence and follow simple commands, dysarthria present from past CVA per pt report. Pt overall very stiff and generally weak, needing significant +2 assist for bed mobility and standing efforts, also total to CGA for ADLs. OT to continue following pt acutely to address deficits and help transition to next level of care. Patient would benefit from post acute skilled rehab facility with <3 hours of therapy and 24/7 support, unsure of how much family was assisting at home but if they have the resources and means to provide support that pt needs then recommend they DC with HH.        If plan is discharge home, recommend the following: A lot of help with walking and/or transfers;Two people to help with walking and/or transfers;A lot of help with bathing/dressing/bathroom;Assistance with cooking/housework;Supervision due to cognitive status;Direct supervision/assist for medications management;Direct supervision/assist for financial management    Functional Status Assessment  Patient has had a recent decline in their functional status and demonstrates the ability to make significant improvements in function in a reasonable and predictable amount of time.  Equipment Recommendations  Other (comment) (pt has rec DME)    Recommendations for Other Services       Precautions / Restrictions        Mobility Bed Mobility Overal bed mobility: Needs Assistance Bed Mobility: Supine to Sit, Sit to Supine     Supine to sit: Total assist, +2 for  physical assistance Sit to supine: Total assist, +2 for physical assistance   General bed mobility comments: via helicopter method, pt needing assist to sequence getting to EOB. Not initiating much    Transfers Overall transfer level: Needs assistance Equipment used: Rolling walker (2 wheels) Transfers: Sit to/from Stand Sit to Stand: Max assist, +2 physical assistance, +2 safety/equipment           General transfer comment: Max A +2 but unable to fully stand, BLEs continue to slide forward, even when BLEs were blocked the RLE would cross over      Balance Overall balance assessment: Needs assistance Sitting-balance support: Feet unsupported Sitting balance-Leahy Scale: Poor Sitting balance - Comments: sitting on ED stretcher, feet unable to touch ground, pt needing increased time to relax BLEs to 90* flexion. Max to CGA assist varying     Standing balance-Leahy Scale: Zero                             ADL either performed or assessed with clinical judgement   ADL Overall ADL's : Needs assistance/impaired Eating/Feeding: Minimal assistance;Bed level   Grooming: Sitting;Wash/dry face;Cueing for sequencing;Contact guard assist   Upper Body Bathing: Sitting;Minimal assistance Upper Body Bathing Details (indicate cue type and reason): sitting balance varies, may need more assist at times in sitting Lower Body Bathing: Sitting/lateral leans;Maximal assistance   Upper Body Dressing : Sitting;Moderate assistance   Lower Body Dressing: Sitting/lateral leans;Total assistance     Toilet Transfer Details (indicate cue type and reason): deferred, unable to fully stand with RW. Anticipate Max A +2 for pivot  Vision   Additional Comments: Periods of time with eyes closed and eyes open     Perception         Praxis         Pertinent Vitals/Pain Pain Assessment Pain Assessment: No/denies pain     Extremity/Trunk Assessment Upper  Extremity Assessment Upper Extremity Assessment: Generalized weakness;RUE deficits/detail;LUE deficits/detail RUE Deficits / Details: AROM 90*  not initiating AROM beyond this when prompted to touch top of head, PROM WFL. Generally stiff LUE Deficits / Details: AROM 90* not initiating AROM beyond this when prompted to touch top of head, PROM WFL, hands/fingers typically noted by be resting with thumb and fingers adducted and digits 2-5 extended. Generally stiff   Lower Extremity Assessment Lower Extremity Assessment: Defer to PT evaluation;Generalized weakness (BLEs 3/5 MMT, pt only able to hold against gravity but not for long. stiff and BLEs tend to cross over)   Cervical / Trunk Assessment Cervical / Trunk Assessment: Kyphotic   Communication Communication Communication: Difficulty communicating thoughts/reduced clarity of speech (dysartrhia) Cueing Techniques: Verbal cues;Gestural cues;Tactile cues   Cognition Arousal: Alert Behavior During Therapy: Flat affect Overall Cognitive Status: No family/caregiver present to determine baseline cognitive functioning                                 General Comments: Pt with prior speech deficits from past CVA per report. Pt does display slower processing with trouble sequencing tasks when prompted to initiate them. Gets tense with movement but also stiff at rest     General Comments  VSS per monitor    Exercises     Shoulder Instructions      Home Living Family/patient expects to be discharged to:: Private residence Living Arrangements: Children (daughter) Available Help at Discharge: Available 24 hours/day Type of Home: House Home Access: Stairs to enter [employer] of Steps: 4-5 Entrance Stairs-Rails: Right Home Layout: One level     Bathroom Shower/Tub: Chief Strategy Officer: Standard Bathroom Accessibility: Yes   Home Equipment: [employer] (2 wheels);BSC/3in1;Grab bars -  toilet;Shower seat;Cane - single point;Tub bench;Wheelchair - manual;Other (comment)   Additional Comments: information from prior admit, pt with dysarthria and hard to understand majority of time. Pt reports 2 falls in the last month      Prior Functioning/Environment Prior Level of Function : Needs assist             Mobility Comments: transfers to w/c, reports not ambulating much at all ADLs Comments: Unsure, pt reports ind with cooking        OT Problem List: Decreased strength;Impaired balance (sitting and/or standing);Impaired tone;Decreased cognition      OT Treatment/Interventions: Self-care/ADL training;Therapeutic exercise;DME and/or AE instruction;Balance training;Patient/family education;Therapeutic activities    OT Goals(Current goals can be found in the care plan section) Acute Rehab OT Goals Patient Stated Goal: To get home and plan husband's funeral OT Goal Formulation: With patient Time For Goal Achievement: 12/15/23 Potential to Achieve Goals: Good ADL Goals Pt Will Perform Lower Body Bathing: with set-up;sitting/lateral leans;with caregiver independent in assisting Pt Will Perform Lower Body Dressing: with contact guard assist;sit to/from stand;with caregiver independent in assisting Pt Will Transfer to Toilet: stand pivot transfer;with min assist;bedside commode Pt/caregiver will Perform Home Exercise Program: Increased strength;Both right and left upper extremity;With theraband;With Supervision Additional ADL Goal #1: Pt will complete bed mobility with Min A to assist with bed pan positioning for toileting  OT Frequency: Min 1X/week    Co-evaluation              AM-PAC OT 6 Clicks Daily Activity     Outcome Measure Help from another person eating meals?: A Little Help from another person taking care of personal grooming?: A Little Help from another person toileting, which includes using toliet, bedpan, or urinal?: A Lot Help from another person  bathing (including washing, rinsing, drying)?: A Lot Help from another person to put on and taking off regular upper body clothing?: A Lot Help from another person to put on and taking off regular lower body clothing?: A Lot 6 Click Score: 14   End of Session Equipment Utilized During Treatment: Gait belt;Rolling walker (2 wheels) Nurse Communication: Mobility status  Activity Tolerance: Patient tolerated treatment well Patient left: with call bell/phone within reach;in bed  OT Visit Diagnosis: Unsteadiness on feet (R26.81);Other (comment) (syncope)                Time: 9083-9057 OT Time Calculation (min): 26 min Charges:  OT General Charges $OT Visit: 1 Visit OT Evaluation $OT Eval Moderate Complexity: 1 Mod  12/01/2023  AB, OTR/L  Acute Rehabilitation Services  Office: [phone]   Curtistine JONETTA Das 12/01/2023, 10:48 AM

## 2023-12-01 NOTE — ED Notes (Signed)
 ED TO INPATIENT HANDOFF REPORT  ED Nurse Name and Phone #: Sherida EMERSON RAMAN Name/Age/Gender Diana Harper 88 y.o. female Room/Bed: 038C/038C  Code Status   Code Status: Full Code  Home/SNF/Other Home Patient oriented to: self Is this baseline? Yes   Triage Complete: Triage complete  Chief Complaint Syncope [R55]  Triage Note error  Pt BIB [facility] from home d/t witnessed syncopal event while planning her husbands funeral. Family started CPR after calling 911, EMS reports upon their arrival she did have a pulse but had shallow breathing. 167/78, 58 bpm, resp 19, CBG 201, ET CO2 36. Upon arrival to ED had strong radial pulses, is responsive to voice, Pupils equal/reactive. Does have Hx of dementia & Bradycardia (per EMS). 20g Lt AC PIV.     Allergies Allergies  Allergen Reactions   Latex Anaphylaxis, Shortness Of Breath and Other (See Comments)    Severe respiratory distress   Onion Diarrhea   Other Other (See Comments)    Patient cannot have TREE NUTS and anything with seeds- History of  diverticulitis    Flexeril  [Cyclobenzaprine ] Other (See Comments)    Caused agitation   Lisinopril Cough   Metformin And Related Diarrhea   Simvastatin Hives and Rash   Tetracycline Rash   Zithromax [Azithromycin] Rash    Level of Care/Admitting Diagnosis ED Disposition     ED Disposition  Admit   Condition  --   Comment  Hospital Area: [facility] [[id number]] Level of Care: Telemetry Cardiac [103] May place patient in observation at [facility] or [facility] if equivalent level of care is available:: No Covid Evaluation: Asymptomatic - no recent expo sure (last 10 days) testing not required Diagnosis: Syncope [206001] Admitting Physician: DOUTOVA, ANASTASSIA [[id number]] Attending Physician: DOUTOVA, ANASTASSIA [[id number]]          B Medical/Surgery History Past Medical History:  Diagnosis Date   Anemia    Anxiety    Arthritis    Asthma    Biliary colic    C.  difficile colitis 08/2022   Cholelithiasis    CKD (chronic kidney disease), stage III [facility])    nephrologist--  dr oliver   Colon polyps    Diverticulosis of colon    Gastric ulcer    Glaucoma    History of asthma    1980's  no longer problem since 1980's   History of diverticulitis of colon    recurrent--  2014;  2013;  2012   Hyperlipidemia    Hypertension    Hypothyroidism    Insulin  dependent type 2 diabetes mellitus (HCC)    followed by dr norleen lambeth ([facility])    Murmur    Peripheral neuropathy    Stroke [facility])    Vitamin D  deficiency    Wears hearing aid    bilateral   Wears partial dentures    lower partial and upper full   Past Surgical History:  Procedure Laterality Date   ABDOMINAL HYSTERECTOMY  1970's   CARDIOVASCULAR STRESS TEST  07/30/2009   normal exercise lexiscan  nuclear study w/ no ischemia/  normal LV funciton and wall motion, ef 77%   CATARACT EXTRACTION W/ INTRAOCULAR LENS  IMPLANT, BILATERAL  2014   CATARACT EXTRACTION, BILATERAL Bilateral 2021   LAPAROSCOPIC CHOLECYSTECTOMY SINGLE SITE WITH INTRAOPERATIVE CHOLANGIOGRAM N/A 08/25/2016   Procedure: LAPAROSCOPIC CHOLECYSTECTOMY WITH INTRAOPERATIVE CHOLANGIOGRAM;  Surgeon: Herlene Beverley Bureau, MD;  Location: [facility];  Service: General;  Laterality: N/A;   LUMBAR SPINE SURGERY  2014   REMOVAL AXILLA CYST Right 1990's     A IV Location/Drains/Wounds Patient Lines/Drains/Airways Status     Active Line/Drains/Airways     Name Placement date Placement time Site Days   Peripheral IV 12/01/23 22 G Posterior;Right Hand 12/01/23  0304  Hand  less than 1            Intake/Output Last 24 hours  Intake/Output Summary (Last 24 hours) at 12/01/2023 1328 Last data filed at 12/01/2023 0646 Gross per 24 hour  Intake --  Output 4 ml  Net -4 ml    Labs/Imaging Results for orders placed or performed during the hospital encounter of 11/30/23 (from the past 48 hours)  CBC     Status:  Abnormal   Collection Time: 11/30/23  3:44 PM  Result Value Ref Range   WBC 7.1 4.0 - 10.5 K/uL   RBC 3.54 (L) 3.87 - 5.11 MIL/uL   Hemoglobin 10.5 (L) 12.0 - 15.0 g/dL   HCT 68.6 (L) 63.9 - 53.9 %   MCV 88.4 80.0 - 100.0 fL   MCH 29.7 26.0 - 34.0 pg   MCHC 33.5 30.0 - 36.0 g/dL   RDW 86.7 88.4 - 84.4 %   Platelets 217 150 - 400 K/uL   nRBC 0.0 0.0 - 0.2 %    Comment: Performed at [facility], [address] [place], [place] [address]  Basic metabolic panel     Status: Abnormal   Collection Time: 11/30/23  3:44 PM  Result Value Ref Range   Sodium 142 135 - 145 mmol/L   Potassium 3.8 3.5 - 5.1 mmol/L   Chloride 110 98 - 111 mmol/L   CO2 22 22 - 32 mmol/L   Glucose, Bld 165 (H) 70 - 99 mg/dL    Comment: Glucose reference range applies only to samples taken after fasting for at least 8 hours.   BUN 23 8 - 23 mg/dL   Creatinine, Ser 8.26 (H) 0.44 - 1.00 mg/dL   Calcium  9.3 8.9 - 10.3 mg/dL   GFR, Estimated 29 (L) >60 mL/min    Comment: (NOTE) Calculated using the CKD-EPI Creatinine Equation (2021)    Anion gap 10 5 - 15    Comment: Performed at [facility], [address], [place], [place] [address]  Troponin I (High Sensitivity)     Status: None   Collection Time: 11/30/23  3:44 PM  Result Value Ref Range   Troponin I (High Sensitivity) 12 <18 ng/L    Comment: (NOTE) Elevated high sensitivity troponin I (hsTnI) values and significant  changes across serial measurements may suggest ACS but many other  chronic and acute conditions are known to elevate hsTnI results.  Refer to the Links section for chest pain algorithms and additional  guidance. Performed at [facility], [address], [place], [place] [address]   Troponin I (High Sensitivity)     Status: None   Collection Time: 11/30/23  6:22 PM  Result Value Ref Range   Troponin I (High Sensitivity) 12 <18 ng/L    Comment: (NOTE) Elevated high sensitivity troponin I (hsTnI) values and significant   changes across serial measurements may suggest ACS but many other  chronic and acute conditions are known to elevate hsTnI results.  Refer to the Links section for chest pain algorithms and additional  guidance. Performed at [facility], [address], [place], [place] [address]   CBG monitoring, ED  Status: Abnormal   Collection Time: 11/30/23 11:54 PM  Result Value Ref Range   Glucose-Capillary 122 (H) 70 - 99 mg/dL    Comment: Glucose reference range applies only to samples taken after fasting for at least 8 hours.  Urinalysis, Complete w Microscopic -Urine, Clean Catch     Status: Abnormal   Collection Time: 12/01/23 12:29 AM  Result Value Ref Range   Color, Urine YELLOW YELLOW   APPearance CLEAR CLEAR   Specific Gravity, Urine 1.013 1.005 - 1.030   pH 6.0 5.0 - 8.0   Glucose, UA NEGATIVE NEGATIVE mg/dL   Hgb urine dipstick NEGATIVE NEGATIVE   Bilirubin Urine NEGATIVE NEGATIVE   Ketones, ur NEGATIVE NEGATIVE mg/dL   Protein, ur >=699 (A) NEGATIVE mg/dL   Nitrite POSITIVE (A) NEGATIVE   Leukocytes,Ua TRACE (A) NEGATIVE   RBC / HPF 0-5 0 - 5 RBC/hpf   WBC, UA 6-10 0 - 5 WBC/hpf   Bacteria, UA NONE SEEN NONE SEEN   Squamous Epithelial / HPF 0-5 0 - 5 /HPF    Comment: Performed at [facility], [address] [place], [place] [address]  Folate     Status: Abnormal   Collection Time: 12/01/23  3:59 AM  Result Value Ref Range   Folate 4.8 (L) >5.9 ng/mL    Comment: Performed at [facility], [address], [place], [place] [address]  Iron and TIBC     Status: Abnormal   Collection Time: 12/01/23  3:59 AM  Result Value Ref Range   Iron 47 28 - 170 ug/dL   TIBC 775 (L) 749 - 549 ug/dL   Saturation Ratios 21 10.4 - 31.8 %   UIBC 177 ug/dL    Comment: Performed at [facility], [address], [place], [place] [address]  Ferritin     Status: None   Collection Time: 12/01/23  3:59 AM  Result Value Ref Range   Ferritin 134 11 - 307 ng/mL     Comment: Performed at [facility], [address], [place], [place] [address]  Reticulocytes     Status: Abnormal   Collection Time: 12/01/23  3:59 AM  Result Value Ref Range   Retic Ct Pct 1.5 0.4 - 3.1 %   RBC. 3.86 (L) 3.87 - 5.11 MIL/uL   Retic Count, Absolute 57.9 19.0 - 186.0 K/uL   Immature Retic Fract 6.6 2.3 - 15.9 %    Comment: Performed at [facility], [address] [place], [place] [address]  Prealbumin     Status: None   Collection Time: 12/01/23  3:59 AM  Result Value Ref Range   Prealbumin 26 18 - 38 mg/dL    Comment: Performed at [facility], [address], [place], [place] [address]  TSH     Status: None   Collection Time: 12/01/23  3:59 AM  Result Value Ref Range   TSH 0.824 0.350 - 4.500 uIU/mL    Comment: Performed by a 3rd Generation assay with a functional sensitivity of <=0.01 uIU/mL. Performed at [facility], [address] [place], [place] [address]   CK     Status: None   Collection Time: 12/01/23  3:59 AM  Result Value Ref Range   Total CK 57 38 - 234 U/L    Comment: Performed at [facility], [address], [place], [place] [address]  Hepatic function panel     Status: Abnormal  Collection Time: 12/01/23  3:59 AM  Result Value Ref Range   Total Protein 5.5 (L) 6.5 - 8.1 g/dL   Albumin  2.9 (L) 3.5 - 5.0 g/dL   AST 14 (L) 15 - 41 U/L   ALT 15 0 - 44 U/L   Alkaline Phosphatase 52 38 - 126 U/L   Total Bilirubin 0.4 0.0 - 1.2 mg/dL   Bilirubin, Direct <9.8 0.0 - 0.2 mg/dL   Indirect Bilirubin NOT CALCULATED 0.3 - 0.9 mg/dL    Comment: Performed at [facility], [address] [place], [place] [address]  Magnesium      Status: Abnormal   Collection Time: 12/01/23  3:59 AM  Result Value Ref Range   Magnesium  1.5 (L) 1.7 - 2.4 mg/dL    Comment: Performed at [facility], [address] [place], [place] [address]  Phosphorus     Status: None   Collection Time: 12/01/23  3:59 AM  Result Value Ref Range    Phosphorus 3.4 2.5 - 4.6 mg/dL    Comment: Performed at [facility], [address], [place], [place] [address]  Comprehensive metabolic panel     Status: Abnormal   Collection Time: 12/01/23  3:59 AM  Result Value Ref Range   Sodium 143 135 - 145 mmol/L   Potassium 3.5 3.5 - 5.1 mmol/L   Chloride 110 98 - 111 mmol/L   CO2 27 22 - 32 mmol/L   Glucose, Bld 134 (H) 70 - 99 mg/dL    Comment: Glucose reference range applies only to samples taken after fasting for at least 8 hours.   BUN 19 8 - 23 mg/dL   Creatinine, Ser 8.31 (H) 0.44 - 1.00 mg/dL   Calcium  9.0 8.9 - 10.3 mg/dL   Total Protein 5.6 (L) 6.5 - 8.1 g/dL   Albumin  3.0 (L) 3.5 - 5.0 g/dL   AST 16 15 - 41 U/L   ALT 15 0 - 44 U/L   Alkaline Phosphatase 54 38 - 126 U/L   Total Bilirubin 0.5 0.0 - 1.2 mg/dL   GFR, Estimated 30 (L) >60 mL/min    Comment: (NOTE) Calculated using the CKD-EPI Creatinine Equation (2021)    Anion gap 6 5 - 15    Comment: Performed at [facility], [address], [place], [place] [address]  CBC     Status: Abnormal   Collection Time: 12/01/23  3:59 AM  Result Value Ref Range   WBC 5.8 4.0 - 10.5 K/uL   RBC 3.91 3.87 - 5.11 MIL/uL   Hemoglobin 11.4 (L) 12.0 - 15.0 g/dL   HCT 66.1 (L) 63.9 - 53.9 %   MCV 86.4 80.0 - 100.0 fL   MCH 29.2 26.0 - 34.0 pg   MCHC 33.7 30.0 - 36.0 g/dL   RDW 86.6 88.4 - 84.4 %   Platelets 173 150 - 400 K/uL   nRBC 0.0 0.0 - 0.2 %    Comment: Performed at [facility], [address], [place], [place] [address]  Vitamin B12     Status: None   Collection Time: 12/01/23  4:00 AM  Result Value Ref Range   Vitamin B-12 456 180 - 914 pg/mL    Comment: (NOTE) This assay is not validated for testing neonatal or myeloproliferative syndrome specimens for Vitamin B12 levels. Performed at [facility], [address], [place], [place] [address]   CBG monitoring, ED     Status: Abnormal  Collection Time: 12/01/23  4:37 AM  Result Value Ref Range    Glucose-Capillary 111 (H) 70 - 99 mg/dL    Comment: Glucose reference range applies only to samples taken after fasting for at least 8 hours.  CBG monitoring, ED     Status: Abnormal   Collection Time: 12/01/23  7:45 AM  Result Value Ref Range   Glucose-Capillary 110 (H) 70 - 99 mg/dL    Comment: Glucose reference range applies only to samples taken after fasting for at least 8 hours.   Comment 1 Notify RN    Comment 2 Document in Chart   CBG monitoring, ED     Status: Abnormal   Collection Time: 12/01/23 11:55 AM  Result Value Ref Range   Glucose-Capillary 123 (H) 70 - 99 mg/dL    Comment: Glucose reference range applies only to samples taken after fasting for at least 8 hours.   DG Abd 1 View Result Date: 12/01/2023 CLINICAL DATA:  Constipation EXAM: ABDOMEN - 1 VIEW COMPARISON:  06/22/2016 FINDINGS: Scattered large and small bowel gas is noted. Mild degenerative changes of lumbar spine are seen. No obstructive changes noted. No free air is seen. IMPRESSION: No acute abnormality noted. Electronically Signed   By: Oneil Devonshire M.D.   On: 12/01/2023 00:11   DG Chest 2 View Result Date: 11/30/2023 CLINICAL DATA:  Syncope. EXAM: CHEST - 2 VIEW COMPARISON:  Chest radiograph dated 10/08/2023. FINDINGS: The heart size and mediastinal contours are within normal limits. There is mild left basilar atelectasis/airspace disease. The right lung is clear. No pleural effusion or pneumothorax. Degenerative changes are seen in the spine. IMPRESSION: Mild left basilar atelectasis/airspace disease. Electronically Signed   By: Norman Hopper M.D.   On: 11/30/2023 16:44   CT Head Wo Contrast Result Date: 11/30/2023 CLINICAL DATA:  Altered mental status, loss of consciousness. EXAM: CT HEAD WITHOUT CONTRAST TECHNIQUE: Contiguous axial images were obtained from the base of the skull through the vertex without intravenous contrast. RADIATION DOSE REDUCTION: This exam was performed according to the departmental  dose-optimization program which includes automated exposure control, adjustment of the mA and/or kV according to patient size and/or use of iterative reconstruction technique. COMPARISON:  CT head dated 10/08/2023. FINDINGS: Brain: No evidence of acute infarction, hemorrhage, hydrocephalus, extra-axial collection or mass lesion/mass effect. There is mild cerebral volume loss with associated ex vacuo dilatation. Periventricular white matter hypoattenuation likely represents chronic small vessel ischemic disease. Chronic lacunar infarcts involving the right basal ganglia, left thalamus, and right corona radiata. Vascular: There are vascular calcifications in the carotid siphons. Skull: Normal. Negative for fracture or focal lesion. Sinuses/Orbits: There is a left sphenoid sinus mucous retention cyst versus polyp. Other: None. IMPRESSION: No acute intracranial process. Electronically Signed   By: Norman Hopper M.D.   On: 11/30/2023 16:43    Pending Labs Unresulted Labs (From admission, onward)    None       Vitals/Pain Today's Vitals   12/01/23 0700 12/01/23 0930 12/01/23 0950 12/01/23 1315  BP: (!) 198/83 (!) 158/63  (!) 170/79  Pulse: 65 83  62  Resp: 15 12  17   Temp:   97.7 F (36.5 C) 98.4 F (36.9 C)  TempSrc:   Oral Oral  SpO2: 95% 100%  100%  Weight:      Height:      PainSc:        Isolation Precautions No active isolations  Medications Medications  insulin  aspart (novoLOG ) injection 0-9 Units ( Subcutaneous  Not Given 12/01/23 1327)  ARIPiprazole  (ABILIFY ) tablet 2 mg (2 mg Oral Given 12/01/23 0950)  aspirin  EC tablet 81 mg (81 mg Oral Given 12/01/23 0950)  atorvastatin  (LIPITOR ) tablet 40 mg (40 mg Oral Given 12/01/23 0950)  levothyroxine  (SYNTHROID ) tablet 88 mcg (88 mcg Oral Given 12/01/23 0626)  insulin  glargine-yfgn (SEMGLEE ) injection 10 Units (10 Units Subcutaneous Given 12/01/23 0120)  sertraline  (ZOLOFT ) tablet 50 mg (50 mg Oral Given 12/01/23 0041)  0.9 %  sodium chloride   infusion ( Intravenous New Bag/Given 12/01/23 0024)  acetaminophen  (TYLENOL ) tablet 650 mg (has no administration in time range)    Or  acetaminophen  (TYLENOL ) suppository 650 mg (has no administration in time range)  HYDROcodone -acetaminophen  (NORCO/VICODIN) 5-325 MG per tablet 1-2 tablet (has no administration in time range)  ondansetron  (ZOFRAN ) tablet 4 mg (has no administration in time range)    Or  ondansetron  (ZOFRAN ) injection 4 mg (has no administration in time range)  albuterol  (PROVENTIL ) (2.5 MG/3ML) 0.083% nebulizer solution 2.5 mg (has no administration in time range)  docusate sodium  (COLACE) capsule 100 mg (100 mg Oral Given 12/01/23 0950)  polyethylene glycol (MIRALAX  / GLYCOLAX ) packet 17 g (has no administration in time range)  hydrALAZINE  (APRESOLINE ) tablet 25 mg (25 mg Oral Given 12/01/23 0415)    Mobility walks with device     Focused Assessments Neuro Assessment Handoff:  Swallow screen pass? Yes  Cardiac Rhythm: Normal sinus rhythm       Neuro Assessment: Exceptions to [facility] Neuro Checks:      Has TPA been given? No If patient is a Neuro Trauma and patient is going to OR before floor call report to 4N Charge nurse: [phone] or [phone]   R Recommendations: See Admitting Provider Note  Report given to:   Additional Notes:

## 2023-12-01 NOTE — Evaluation (Signed)
 Physical Therapy Evaluation Patient Details Name: Diana Harper MRN: [id number] DOB: 11/15/1936 Today's Date: 12/01/2023  History of Present Illness  Pt is an 88 yo female who presents to [facility] from home with witnessed syncope. PMH PMH: prior CVA, type 2 diabetes, essential hypertension, hyperlipidemia, hypothyroidism, recent C. difficile colitis, dementia  Clinical Impression  Pt presents with admitting diagnosis above. Co-treat with OT. Unsure of baseline cognition as she needs increased time to sequence and follow simple commands, dysarthria present from past CVA per pt report. However per chart review it appears that pt is mostly WC level at baseline. Pt overall very stiff and generally weak, needing significant +2 assist for bed mobility and standing efforts. Patient will benefit from continued inpatient follow up therapy, <3 hours/day however if family elects to take pt home then recommend HHPT with 24/7 supervision. PT will continue to follow.        If plan is discharge home, recommend the following: Two people to help with walking and/or transfers;A lot of help with bathing/dressing/bathroom;Assistance with cooking/housework;Direct supervision/assist for medications management;Direct supervision/assist for financial management;Assist for transportation;Help with stairs or ramp for entrance;Supervision due to cognitive status   Can travel by private vehicle   No    Equipment Recommendations Other (comment) (Per accepting facility)  Recommendations for Other Services       Functional Status Assessment Patient has had a recent decline in their functional status and demonstrates the ability to make significant improvements in function in a reasonable and predictable amount of time.     Precautions / Restrictions Precautions Precautions: Fall Restrictions Weight Bearing Restrictions Per Provider Order: No      Mobility  Bed Mobility Overal bed mobility: Needs Assistance Bed  Mobility: Supine to Sit, Sit to Supine     Supine to sit: Total assist, +2 for physical assistance Sit to supine: Total assist, +2 for physical assistance   General bed mobility comments: via helicopter method, pt needing assist to sequence getting to EOB. Not initiating much    Transfers Overall transfer level: Needs assistance Equipment used: Rolling walker (2 wheels) Transfers: Sit to/from Stand Sit to Stand: Max assist, +2 physical assistance, +2 safety/equipment           General transfer comment: Max A +2 but unable to fully stand, BLEs continue to slide forward, even when BLEs were blocked the RLE would cross over    Ambulation/Gait               General Gait Details: Unable  Stairs            Wheelchair Mobility     Tilt Bed    Modified Rankin (Stroke Patients Only)       Balance Overall balance assessment: Needs assistance Sitting-balance support: Feet unsupported Sitting balance-Leahy Scale: Poor Sitting balance - Comments: sitting on ED stretcher, feet unable to touch ground, pt needing increased time to relax BLEs to 90* flexion. Max to CGA assist varying     Standing balance-Leahy Scale: Zero                               Pertinent Vitals/Pain Pain Assessment Pain Assessment: No/denies pain    Home Living Family/patient expects to be discharged to:: Private residence Living Arrangements: Children (daughter) Available Help at Discharge: Available 24 hours/day Type of Home: House Home Access: Stairs to enter Entrance Stairs-Rails: Right Entrance Stairs-Number of Steps: 4-5   Home Layout: One  level Home Equipment: Rolling Walker (2 wheels);BSC/3in1;Grab bars - toilet;Shower seat;Cane - single point;Tub bench;Wheelchair - manual;Other (comment) Additional Comments: information from prior admit, pt with dysarthria and hard to understand majority of time. Pt reports 2 falls in the last month    Prior Function Prior Level  of Function : Needs assist             Mobility Comments: transfers to w/c, reports not ambulating much at all ADLs Comments: Unsure, pt reports ind with cooking     Extremity/Trunk Assessment   Upper Extremity Assessment RUE Deficits / Details: AROM 90*  not initiating AROM beyond this when prompted to touch top of head, PROM WFL. Generally stiff LUE Deficits / Details: AROM 90* not initiating AROM beyond this when prompted to touch top of head, PROM WFL, hands/fingers typically noted by be resting with thumb and fingers adducted and digits 2-5 extended. Generally stiff    Lower Extremity Assessment Lower Extremity Assessment: Generalized weakness    Cervical / Trunk Assessment Cervical / Trunk Assessment: Kyphotic  Communication   Communication Communication: Difficulty communicating thoughts/reduced clarity of speech (dysartrhia)  Cognition Arousal: Alert Behavior During Therapy: Flat affect Overall Cognitive Status: No family/caregiver present to determine baseline cognitive functioning                                 General Comments: Pt with prior speech deficits from past CVA per report. Pt does display slower processing with trouble sequencing tasks when prompted to initiate them. Gets tense with movement but also stiff at rest        General Comments General comments (skin integrity, edema, etc.): VSS per monitor    Exercises     Assessment/Plan    PT Assessment Patient needs continued PT services  PT Problem List Decreased strength;Decreased range of motion;Decreased activity tolerance;Decreased balance;Decreased mobility;Decreased coordination;Decreased cognition;Decreased knowledge of use of DME;Decreased safety awareness;Decreased knowledge of precautions;Cardiopulmonary status limiting activity       PT Treatment Interventions      PT Goals (Current goals can be found in the Care Plan section)  Acute Rehab PT Goals PT Goal Formulation:  Patient unable to participate in goal setting Time For Goal Achievement: 12/15/23 Potential to Achieve Goals: Poor    Frequency Min 1X/week     Co-evaluation               AM-PAC PT 6 Clicks Mobility  Outcome Measure Help needed turning from your back to your side while in a flat bed without using bedrails?: Total Help needed moving from lying on your back to sitting on the side of a flat bed without using bedrails?: Total Help needed moving to and from a bed to a chair (including a wheelchair)?: Total Help needed standing up from a chair using your arms (e.g., wheelchair or bedside chair)?: Total Help needed to walk in hospital room?: Total Help needed climbing 3-5 steps with a railing? : Total 6 Click Score: 6    End of Session   Activity Tolerance: Patient limited by fatigue Patient left: in bed;with call bell/phone within reach Nurse Communication: Mobility status PT Visit Diagnosis: Other abnormalities of gait and mobility (R26.89)    Time: 9082-9056 PT Time Calculation (min) (ACUTE ONLY): 26 min   Charges:   PT Evaluation $PT Eval Moderate Complexity: 1 Mod   PT General Charges $$ ACUTE PT VISIT: 1 Visit  Maylene Crocker B, PT, DPT Acute Rehab Services [phone]   Aariana Shankland 12/01/2023, 4:16 PM

## 2023-12-02 ENCOUNTER — Inpatient Hospital Stay (HOSPITAL_BASED_OUTPATIENT_CLINIC_OR_DEPARTMENT_OTHER)
Admit: 2023-12-02 | Discharge: 2023-12-02 | Disposition: A | Discharge status: Home / Self Care | Payer: Medicare Other | Attending: Student | Admitting: Student

## 2023-12-02 DIAGNOSIS — R55 Syncope and collapse: Secondary | ICD-10-CM | POA: Diagnosis not present

## 2023-12-02 LAB — GLUCOSE, CAPILLARY
Glucose-Capillary: 104 mg/dL — ABNORMAL HIGH (ref 70–99)
Glucose-Capillary: 108 mg/dL — ABNORMAL HIGH (ref 70–99)
Glucose-Capillary: 124 mg/dL — ABNORMAL HIGH (ref 70–99)

## 2023-12-02 MED ORDER — AMLODIPINE BESYLATE 10 MG PO TABS: 10.0000 mg | ORAL_TABLET | Freq: Every day | ORAL | 1 refills | Status: DC

## 2023-12-02 MED ORDER — INSULIN DEGLUDEC 200 UNIT/ML ~~LOC~~ SOPN: 10.0000 [IU] | PEN_INJECTOR | SUBCUTANEOUS | Status: DC

## 2023-12-02 MED ORDER — CARVEDILOL 12.5 MG PO TABS: 12.5000 mg | ORAL_TABLET | Freq: Two times a day (BID) | ORAL | 1 refills | Status: DC

## 2023-12-02 MED ORDER — DOCUSATE SODIUM 100 MG PO CAPS: 100.0000 mg | ORAL_CAPSULE | Freq: Two times a day (BID) | ORAL | 0 refills | Status: DC

## 2023-12-02 MED ORDER — POLYETHYLENE GLYCOL 3350 17 G PO PACK: 17.0000 g | PACK | Freq: Every day | ORAL | 0 refills | Status: DC | PRN

## 2023-12-02 NOTE — Consult Note (Signed)
 [facility] Liaison Consult Note   12/02/2023  Diana Harper 1936-11-13 [id number]  Insurance: Medicare ACO Reach   Primary Care Provider: Waylan Almarie SAUNDERS, MD, this provider is listed for the transition of care follow up appointments  and [facility] calls   [facility] Liaison met patient at bedside at [facility]. Patient resting in bed no family was currently at bedside. Spoke with daughter Belisa Eichholz by phone number listed in electronic medical record. HIPAA verified and explained reason for call and for anticipated follow up call from [facility] team.   The patient was screened for noted high risk score for unplanned readmission risk 2 hospital admissions in 6 months. Patient with previous history noted with [facility]/  The patient was assessed for potential Community Care Coordination service needs for post hospital transition for care coordination. Review of patient's electronic medical record reveals patient is from home with [facility] and had palliative noted with [facility] and confirmed by daughter.  Plan: [facility] Liaison will continue to follow progress and disposition to asess for post hospital community care coordination/management needs.  Referral request for community care coordination: Anticipate patient to be followed by [facility] team   [facility], Population Health does not replace or interfere with any arrangements made by the Inpatient Transition of Care team.   For questions contact:   Richerd Fish, RN, BSN, CCM [place]  [facility], [facility] Liaison Direct Dial: [phone] or secure chat Email: [email or web address]

## 2023-12-02 NOTE — Plan of Care (Signed)

## 2023-12-02 NOTE — TOC Transition Note (Signed)
 Transition of Care [facility]) - Discharge Note   Patient Details  Name: Diana Harper MRN: [id number] Date of Birth: 12/13/1935  Transition of Care [facility]) CM/SW Contact:  Waddell Barnie Rama, RN Phone Number: 12/02/2023, 12:28 PM   Clinical Narrative:    NCM notified Lynette with [facility] regarding dc today.  She will call this NCM back to let know what services patient has with them.  Patient is also active with OP Palliative services NCM notified Elouise Husband.  Per Daughter she states patient is active with HHRN, HHPT, HHOT, HHST , and they would like to resume services.  Staff RN will call daughter to go over the dc with her.    Final next level of care: Skilled Nursing Facility Barriers to Discharge: Continued Medical Work up   Patient Goals and CMS Choice Patient states their goals for this hospitalization and ongoing recovery are:: Daughter wants pt to return home          Discharge Placement                       Discharge Plan and Services Additional resources added to the After Visit Summary for                                       Social Drivers of Health (SDOH) Interventions SDOH Screenings   Food Insecurity: Patient Unable To Answer (12/01/2023)  Housing: Patient Unable To Answer (12/01/2023)  Transportation Needs: Patient Unable To Answer (12/01/2023)  Utilities: Patient Unable To Answer (12/01/2023)  Social Connections: Patient Unable To Answer (12/01/2023)  Tobacco Use: Low Risk  (11/16/2023)     Readmission Risk Interventions    07/27/2022   10:46 AM  Readmission Risk Prevention Plan  Transportation Screening Complete  PCP or Specialist Appt within 3-5 Days Complete  HRI or Home Care Consult Complete  Social Work Consult for Recovery Care Planning/Counseling Complete  Palliative Care Screening Not Applicable  Medication Review Oceanographer) Complete

## 2023-12-02 NOTE — Progress Notes (Signed)
   Ordered 2 week live Zio monitor to be placed in the hospital prior to discharge today for further evaluation of syncope. Please see Dr. Lupe Carney consult note from 12/01/2023 for additional information.  Corrin Parker, PA-C 12/02/2023 10:18 AM

## 2023-12-02 NOTE — Discharge Summary (Signed)
 Physician Discharge Summary  Patient ID: Diana Harper MRN: [id number] DOB/AGE: May 24, 1936 88 y.o.  Admit date: 11/30/2023 Discharge date: 12/02/2023  Admission Diagnoses:  Discharge Diagnoses:  Principal Problem:   Syncope, vasovagal Active Problems:   History of CVA (cerebrovascular accident)   Hypothyroidism   Uncontrolled type 2 diabetes mellitus with hyperglycemia, without long-term current use of insulin  (HCC)   Anemia   CKD (chronic kidney disease), stage IV (HCC)   Hypertensive urgency   Discharged Condition: stable  Hospital Course:  Patient is an 88 year old African-American female with past medical history significant for vasovagal syncope, hypertension, hyperlipidemia, chronic diastolic CHF, diabetes mellitus type 2, hypothyroidism, history of CVA, dementia, chronic anemia and CKD stage IV.  Patient was admitted with syncope and hypertensive urgency.  Systolic blood pressure of 226 mmHg was documented at some point.  Cardiology input is appreciated.  Recent echo only revealed grade 1 diastolic dysfunction, with normal estimated left ventricular ejection fraction and small pericardial effusion.  Patient has remained stable.  No further syncopal episodes reported.  Patient will be discharged back home to the care of the primary care provider and nephrology team.  Holter monitor will be placed on discharge.  Patient will also follow-up with cardiology team on discharge.  Blood pressure will need to be monitored closely on discharge.   Syncope: -History of vasovagal syncope. -Negative workup. -Hypertensive urgency noted. -Holter monitor will be placed prior to discharge.  Hypertensive urgency: -Patient was on Coreg  6.25 Mg p.o. twice daily and amlodipine  5 Mg p.o. once daily. -Discussed with patient's daughter, patient was on losartan  but may have discontinued due to hyperkalemia. -Patient's potassium has been within normal limits.  I have also noted low potassium.  Patient is  known to the nephrology team.  With deferred the decision to restart losartan  to the nephrology team. -Patient will be discharged back home on amlodipine  10 Mg p.o. once daily and Coreg  12.5 Mg p.o. once daily. -Goal blood pressure should be less than 130/80 mmHg. -Monitor blood pressure closely on discharge. -Above was communicated to the patient's daughter and she voiced that she understood.   CKD (chronic kidney disease), stage IV (HCC) -CKD is stable. -Continue to monitor closely. -Avoid nephrotoxins. -Optimize blood pressure control gradually.   History of CVA (cerebrovascular accident) Continue aspirin  and statin Stable.   Hypothyroidism -TSH done earlier today was 0.824. Synthroid  at 88 mcg po q day     Uncontrolled type 2 diabetes mellitus with hyperglycemia, without long-term current use of insulin  (HCC) -Blood sugars controlled. -Continue sliding scale insulin  coverage.      Anemia Hemoglobin is stable.    Consults: cardiology  Significant Diagnostic Studies:     Discharge Exam: Blood pressure (!) 175/82, pulse 79, temperature 97.9 F (36.6 C), temperature source Oral, resp. rate 19, height 5' 6 (1.676 m), weight 58.9 kg, SpO2 100%.   Disposition: Discharge disposition: 01-Home or Self Care       Discharge Instructions     Diet - low sodium heart healthy   Complete by: As directed    Diet Carb Modified   Complete by: As directed    Increase activity slowly   Complete by: As directed       Allergies as of 12/02/2023       Reactions   Latex Anaphylaxis, Shortness Of Breath, Other (See Comments)   Severe respiratory distress   Onion Diarrhea   Other Other (See Comments)   Patient cannot have TREE NUTS and anything with seeds-  History of  diverticulitis    Flexeril  [cyclobenzaprine ] Other (See Comments)   Caused agitation   Lisinopril Cough   Metformin And Related Diarrhea   Simvastatin Hives, Rash   Tetracycline Rash   Zithromax  [azithromycin] Rash        Medication List     STOP taking these medications    L-Theanine 100 MG Caps       TAKE these medications    acetaminophen  500 MG tablet Commonly known as: TYLENOL  Take 1,000 mg by mouth every 8 (eight) hours as needed for mild pain, headache or fever.   albuterol  108 (90 Base) MCG/ACT inhaler Commonly known as: VENTOLIN  HFA Inhale 2 puffs into the lungs every 4 (four) hours as needed for wheezing or shortness of breath.   amLODipine  10 MG tablet Commonly known as: NORVASC  Take 1 tablet (10 mg total) by mouth daily. What changed:  medication strength how much to take   ARIPiprazole  2 MG tablet Commonly known as: ABILIFY  Take 2 mg by mouth daily.   aspirin  EC 81 MG tablet Take 1 tablet (81 mg total) by mouth daily. Swallow whole. What changed:  when to take this reasons to take this   atorvastatin  40 MG tablet Commonly known as: LIPITOR  Take 1 tablet (40 mg total) by mouth daily.   carvedilol  12.5 MG tablet Commonly known as: COREG  Take 1 tablet (12.5 mg total) by mouth 2 (two) times daily with a meal. What changed:  medication strength how much to take   D-Mannose 500 MG Caps Take 500 mg by mouth daily.   diclofenac Sodium 1 % Gel Commonly known as: VOLTAREN Apply 2 g topically 4 (four) times daily as needed (joint pain).   docusate sodium  100 MG capsule Commonly known as: COLACE Take 1 capsule (100 mg total) by mouth 2 (two) times daily.   FreeStyle Libre 14 Day Sensor Misc Inject 1 Device into the skin every 14 (fourteen) days.   FreeStyle [place] 2 Reader Espiridion See admin instructions.   Glucagon HCl 1 MG Solr Inject 1 mg into the skin as needed (for a blood sugar <67).   insulin  degludec 200 UNIT/ML FlexTouch Pen Commonly known as: TRESIBA  Inject 10 Units into the skin See admin instructions. 50 units SQ once daily if BG is greater than 150 What changed: how much to take   levothyroxine  88 MCG tablet Commonly  known as: SYNTHROID  Take 88 mcg by mouth daily before breakfast.   nitroGLYCERIN  0.4 MG SL tablet Commonly known as: NITROSTAT  Place 1 tablet (0.4 mg total) under the tongue every 5 (five) minutes as needed for chest pain.   NovoLOG  FlexPen 100 UNIT/ML FlexPen Generic drug: insulin  aspart Inject 5-10 Units into the skin See admin instructions. Inject 5 units into the skin before meals if BGL is 100-150, 7 units if BGL is 151-200, and 10 units if BGL is greater than 200   polyethylene glycol 17 g packet Commonly known as: MIRALAX  / GLYCOLAX  Take 17 g by mouth daily as needed for mild constipation.   sertraline  50 MG tablet Commonly known as: ZOLOFT  Take 50 mg by mouth at bedtime.   Vitamin D3 125 MCG (5000 UT) Caps Take 5,000 Units by mouth daily.        Time spent: 35 minutes.  SignedBETHA Leatrice LILLETTE Rosario 12/02/2023, 12:08 PM

## 2023-12-02 NOTE — Progress Notes (Signed)
 Patient discharged in stable condition.

## 2023-12-02 NOTE — TOC Initial Note (Signed)
 Transition of Care [facility]) - Initial/Assessment Note    Patient Details  Name: Diana Harper MRN: [id number] Date of Birth: 10-03-1936  [facility]) CM/SW Contact:    Luann SHAUNNA Cumming, LCSW Phone Number: 12/02/2023, 10:22 AM  Clinical Narrative:                  PT recommendation for SNF. CSW called pt's daughter, Bobbette, to discuss disposition. Pt lives at home with Bobbette and her other daughter. Pt uses wheelchair at baseline. [name] would like pt to return home instead of SNF. Pt is active with [facility] and also has OP services. Pt has a hospital bed at home. Family would transport home at DC. MD, RN, and [facility] notified.   Expected Discharge Plan: Skilled Nursing Facility Barriers to Discharge: Continued Medical Work up   Patient Goals and CMS Choice Patient states their goals for this hospitalization and ongoing recovery are:: Daughter wants pt to return home          Expected Discharge Plan and Services       Living arrangements for the past 2 months: Single Family Home                                      Prior Living Arrangements/Services Living arrangements for the past 2 months: Single Family Home Lives with:: Adult Children Patient language and need for interpreter reviewed:: Yes        Need for Family Participation in Patient Care: Yes (Comment) Care giver support system in place?: Yes (comment)   Criminal Activity/Legal Involvement Pertinent to Current Situation/Hospitalization: No - Comment as needed  Activities of Daily Living      Permission Sought/Granted                  Emotional Assessment       Orientation: : Oriented to Self Alcohol  / Substance Use: Not Applicable Psych Involvement: No (comment)  Admission diagnosis:  Syncope [R55] Syncope, unspecified syncope type [R55] Hypertensive urgency [I16.0] Patient Active Problem List   Diagnosis Date Noted   Hypertensive urgency 10/08/2023   Dementia without  behavioral disturbance (HCC) 10/08/2023   Acute ischemic stroke (HCC) 04/18/2023   Myelopathy (HCC) 01/09/2023   Acute metabolic encephalopathy 09/15/2022   Vitamin B12 deficiency 09/15/2022   Thiamine  deficiency 09/15/2022   Back pain 09/15/2022   CKD (chronic kidney disease), stage IV (HCC) 09/15/2022   Hypokalemia 09/15/2022   AMS (altered mental status) 09/12/2022   C. difficile colitis 07/25/2022   Anemia 07/24/2022   Uncontrolled type 2 diabetes mellitus with hyperglycemia, without long-term current use of insulin  (HCC) 07/17/2022   Acute diverticulitis 04/06/2021   CKD stage 3 due to type 2 diabetes mellitus (HCC) 04/06/2021   Essential hypertension 04/06/2021   Hypothyroidism    Depression    COPD (chronic obstructive pulmonary disease) (HCC)    Anorexia 02/11/2021   Bloating symptom 02/11/2021   History of colon polyps 02/09/2021   Hemorrhoids 02/09/2021   Rectal bleeding 02/09/2021   Fecal smearing 02/09/2021   Acute renal failure superimposed on stage 3a chronic kidney disease (HCC) 12/13/2019   Acute kidney injury (HCC) 12/12/2019   Diarrhea 12/12/2019   Dehydration 12/12/2019   Cerebral thrombosis with cerebral infarction 10/10/2017   Cerebral embolism with cerebral infarction 10/10/2017   Subarachnoid hemorrhage 10/10/2017   Intracerebral hemorrhage 10/10/2017   History of CVA (cerebrovascular accident) 10/10/2017  Chronic cholecystitis with calculus 08/25/2016   Syncope, vasovagal 11/15/2011   Diverticulitis 11/15/2011   PCP:  Waylan Almarie SAUNDERS, MD Pharmacy:   [facility] [address], [place] - [address] [place] [address] Phone: [phone] Fax: [phone]  [name] [address] [place] [address] Phone: [phone] Fax: [phone]  [facility] - [facility] [address] [place] [address] Phone: [phone] Fax:  [phone]     Social Drivers of Health (SDOH) Social History: SDOH Screenings   Food Insecurity: Patient Unable To Answer (12/01/2023)  Housing: Patient Unable To Answer (12/01/2023)  Transportation Needs: Patient Unable To Answer (12/01/2023)  Utilities: Patient Unable To Answer (12/01/2023)  Social Connections: Patient Unable To Answer (12/01/2023)  Tobacco Use: Low Risk  (11/16/2023)   SDOH Interventions:     Readmission Risk Interventions    07/27/2022   10:46 AM  Readmission Risk Prevention Plan  Transportation Screening Complete  PCP or Specialist Appt within 3-5 Days Complete  HRI or Home Care Consult Complete  Social Work Consult for Recovery Care Planning/Counseling Complete  Palliative Care Screening Not Applicable  Medication Review Oceanographer) Complete

## 2023-12-03 DIAGNOSIS — R55 Syncope and collapse: Secondary | ICD-10-CM | POA: Diagnosis not present

## 2023-12-05 DIAGNOSIS — F0283 Dementia in other diseases classified elsewhere, unspecified severity, with mood disturbance: Secondary | ICD-10-CM | POA: Diagnosis not present

## 2023-12-05 DIAGNOSIS — E039 Hypothyroidism, unspecified: Secondary | ICD-10-CM | POA: Diagnosis not present

## 2023-12-05 DIAGNOSIS — E1122 Type 2 diabetes mellitus with diabetic chronic kidney disease: Secondary | ICD-10-CM | POA: Diagnosis not present

## 2023-12-05 DIAGNOSIS — I129 Hypertensive chronic kidney disease with stage 1 through stage 4 chronic kidney disease, or unspecified chronic kidney disease: Secondary | ICD-10-CM | POA: Diagnosis not present

## 2023-12-05 DIAGNOSIS — F0284 Dementia in other diseases classified elsewhere, unspecified severity, with anxiety: Secondary | ICD-10-CM | POA: Diagnosis not present

## 2023-12-05 DIAGNOSIS — G9341 Metabolic encephalopathy: Secondary | ICD-10-CM | POA: Diagnosis not present

## 2023-12-07 DIAGNOSIS — E1121 Type 2 diabetes mellitus with diabetic nephropathy: Secondary | ICD-10-CM | POA: Diagnosis not present

## 2023-12-07 DIAGNOSIS — E1159 Type 2 diabetes mellitus with other circulatory complications: Secondary | ICD-10-CM | POA: Diagnosis not present

## 2023-12-07 DIAGNOSIS — E1165 Type 2 diabetes mellitus with hyperglycemia: Secondary | ICD-10-CM | POA: Diagnosis not present

## 2023-12-07 DIAGNOSIS — E1122 Type 2 diabetes mellitus with diabetic chronic kidney disease: Secondary | ICD-10-CM | POA: Diagnosis not present

## 2023-12-07 DIAGNOSIS — I152 Hypertension secondary to endocrine disorders: Secondary | ICD-10-CM | POA: Diagnosis not present

## 2023-12-07 DIAGNOSIS — F0390 Unspecified dementia without behavioral disturbance: Secondary | ICD-10-CM | POA: Diagnosis not present

## 2023-12-07 DIAGNOSIS — I639 Cerebral infarction, unspecified: Secondary | ICD-10-CM | POA: Diagnosis not present

## 2023-12-08 DIAGNOSIS — F0283 Dementia in other diseases classified elsewhere, unspecified severity, with mood disturbance: Secondary | ICD-10-CM | POA: Diagnosis not present

## 2023-12-08 DIAGNOSIS — G9341 Metabolic encephalopathy: Secondary | ICD-10-CM | POA: Diagnosis not present

## 2023-12-08 DIAGNOSIS — I129 Hypertensive chronic kidney disease with stage 1 through stage 4 chronic kidney disease, or unspecified chronic kidney disease: Secondary | ICD-10-CM | POA: Diagnosis not present

## 2023-12-08 DIAGNOSIS — E039 Hypothyroidism, unspecified: Secondary | ICD-10-CM | POA: Diagnosis not present

## 2023-12-08 DIAGNOSIS — E1122 Type 2 diabetes mellitus with diabetic chronic kidney disease: Secondary | ICD-10-CM | POA: Diagnosis not present

## 2023-12-08 DIAGNOSIS — F0284 Dementia in other diseases classified elsewhere, unspecified severity, with anxiety: Secondary | ICD-10-CM | POA: Diagnosis not present

## 2023-12-12 DIAGNOSIS — F0283 Dementia in other diseases classified elsewhere, unspecified severity, with mood disturbance: Secondary | ICD-10-CM | POA: Diagnosis not present

## 2023-12-12 DIAGNOSIS — G9341 Metabolic encephalopathy: Secondary | ICD-10-CM | POA: Diagnosis not present

## 2023-12-12 DIAGNOSIS — I129 Hypertensive chronic kidney disease with stage 1 through stage 4 chronic kidney disease, or unspecified chronic kidney disease: Secondary | ICD-10-CM | POA: Diagnosis not present

## 2023-12-12 DIAGNOSIS — F0284 Dementia in other diseases classified elsewhere, unspecified severity, with anxiety: Secondary | ICD-10-CM | POA: Diagnosis not present

## 2023-12-12 DIAGNOSIS — E1122 Type 2 diabetes mellitus with diabetic chronic kidney disease: Secondary | ICD-10-CM | POA: Diagnosis not present

## 2023-12-12 DIAGNOSIS — E039 Hypothyroidism, unspecified: Secondary | ICD-10-CM | POA: Diagnosis not present

## 2023-12-14 DIAGNOSIS — E039 Hypothyroidism, unspecified: Secondary | ICD-10-CM | POA: Diagnosis not present

## 2023-12-14 DIAGNOSIS — I129 Hypertensive chronic kidney disease with stage 1 through stage 4 chronic kidney disease, or unspecified chronic kidney disease: Secondary | ICD-10-CM | POA: Diagnosis not present

## 2023-12-14 DIAGNOSIS — F0283 Dementia in other diseases classified elsewhere, unspecified severity, with mood disturbance: Secondary | ICD-10-CM | POA: Diagnosis not present

## 2023-12-14 DIAGNOSIS — E1122 Type 2 diabetes mellitus with diabetic chronic kidney disease: Secondary | ICD-10-CM | POA: Diagnosis not present

## 2023-12-14 DIAGNOSIS — F0284 Dementia in other diseases classified elsewhere, unspecified severity, with anxiety: Secondary | ICD-10-CM | POA: Diagnosis not present

## 2023-12-14 DIAGNOSIS — G9341 Metabolic encephalopathy: Secondary | ICD-10-CM | POA: Diagnosis not present

## 2023-12-15 DIAGNOSIS — I69320 Aphasia following cerebral infarction: Secondary | ICD-10-CM | POA: Diagnosis not present

## 2023-12-15 DIAGNOSIS — E860 Dehydration: Secondary | ICD-10-CM | POA: Diagnosis not present

## 2023-12-15 DIAGNOSIS — I129 Hypertensive chronic kidney disease with stage 1 through stage 4 chronic kidney disease, or unspecified chronic kidney disease: Secondary | ICD-10-CM | POA: Diagnosis not present

## 2023-12-15 DIAGNOSIS — K5730 Diverticulosis of large intestine without perforation or abscess without bleeding: Secondary | ICD-10-CM | POA: Diagnosis not present

## 2023-12-15 DIAGNOSIS — E559 Vitamin D deficiency, unspecified: Secondary | ICD-10-CM | POA: Diagnosis not present

## 2023-12-15 DIAGNOSIS — E1122 Type 2 diabetes mellitus with diabetic chronic kidney disease: Secondary | ICD-10-CM | POA: Diagnosis not present

## 2023-12-15 DIAGNOSIS — K259 Gastric ulcer, unspecified as acute or chronic, without hemorrhage or perforation: Secondary | ICD-10-CM | POA: Diagnosis not present

## 2023-12-15 DIAGNOSIS — M1990 Unspecified osteoarthritis, unspecified site: Secondary | ICD-10-CM | POA: Diagnosis not present

## 2023-12-15 DIAGNOSIS — I2510 Atherosclerotic heart disease of native coronary artery without angina pectoris: Secondary | ICD-10-CM | POA: Diagnosis not present

## 2023-12-15 DIAGNOSIS — E538 Deficiency of other specified B group vitamins: Secondary | ICD-10-CM | POA: Diagnosis not present

## 2023-12-15 DIAGNOSIS — F0283 Dementia in other diseases classified elsewhere, unspecified severity, with mood disturbance: Secondary | ICD-10-CM | POA: Diagnosis not present

## 2023-12-15 DIAGNOSIS — D631 Anemia in chronic kidney disease: Secondary | ICD-10-CM | POA: Diagnosis not present

## 2023-12-15 DIAGNOSIS — J4489 Other specified chronic obstructive pulmonary disease: Secondary | ICD-10-CM | POA: Diagnosis not present

## 2023-12-15 DIAGNOSIS — J4520 Mild intermittent asthma, uncomplicated: Secondary | ICD-10-CM | POA: Diagnosis not present

## 2023-12-15 DIAGNOSIS — E1165 Type 2 diabetes mellitus with hyperglycemia: Secondary | ICD-10-CM | POA: Diagnosis not present

## 2023-12-15 DIAGNOSIS — N184 Chronic kidney disease, stage 4 (severe): Secondary | ICD-10-CM | POA: Diagnosis not present

## 2023-12-15 DIAGNOSIS — I2584 Coronary atherosclerosis due to calcified coronary lesion: Secondary | ICD-10-CM | POA: Diagnosis not present

## 2023-12-15 DIAGNOSIS — E1142 Type 2 diabetes mellitus with diabetic polyneuropathy: Secondary | ICD-10-CM | POA: Diagnosis not present

## 2023-12-15 DIAGNOSIS — I69391 Dysphagia following cerebral infarction: Secondary | ICD-10-CM | POA: Diagnosis not present

## 2023-12-15 DIAGNOSIS — F0284 Dementia in other diseases classified elsewhere, unspecified severity, with anxiety: Secondary | ICD-10-CM | POA: Diagnosis not present

## 2023-12-15 DIAGNOSIS — E039 Hypothyroidism, unspecified: Secondary | ICD-10-CM | POA: Diagnosis not present

## 2023-12-15 DIAGNOSIS — G9389 Other specified disorders of brain: Secondary | ICD-10-CM | POA: Diagnosis not present

## 2023-12-15 DIAGNOSIS — I69322 Dysarthria following cerebral infarction: Secondary | ICD-10-CM | POA: Diagnosis not present

## 2023-12-15 DIAGNOSIS — G959 Disease of spinal cord, unspecified: Secondary | ICD-10-CM | POA: Diagnosis not present

## 2023-12-15 DIAGNOSIS — K649 Unspecified hemorrhoids: Secondary | ICD-10-CM | POA: Diagnosis not present

## 2023-12-19 DIAGNOSIS — E538 Deficiency of other specified B group vitamins: Secondary | ICD-10-CM | POA: Diagnosis not present

## 2023-12-19 DIAGNOSIS — I69322 Dysarthria following cerebral infarction: Secondary | ICD-10-CM | POA: Diagnosis not present

## 2023-12-19 DIAGNOSIS — I69391 Dysphagia following cerebral infarction: Secondary | ICD-10-CM | POA: Diagnosis not present

## 2023-12-19 DIAGNOSIS — E039 Hypothyroidism, unspecified: Secondary | ICD-10-CM | POA: Diagnosis not present

## 2023-12-19 DIAGNOSIS — F0283 Dementia in other diseases classified elsewhere, unspecified severity, with mood disturbance: Secondary | ICD-10-CM | POA: Diagnosis not present

## 2023-12-19 DIAGNOSIS — I69320 Aphasia following cerebral infarction: Secondary | ICD-10-CM | POA: Diagnosis not present

## 2023-12-20 DIAGNOSIS — I69322 Dysarthria following cerebral infarction: Secondary | ICD-10-CM | POA: Diagnosis not present

## 2023-12-20 DIAGNOSIS — I69320 Aphasia following cerebral infarction: Secondary | ICD-10-CM | POA: Diagnosis not present

## 2023-12-20 DIAGNOSIS — I69391 Dysphagia following cerebral infarction: Secondary | ICD-10-CM | POA: Diagnosis not present

## 2023-12-20 DIAGNOSIS — E538 Deficiency of other specified B group vitamins: Secondary | ICD-10-CM | POA: Diagnosis not present

## 2023-12-20 DIAGNOSIS — N184 Chronic kidney disease, stage 4 (severe): Secondary | ICD-10-CM | POA: Diagnosis not present

## 2023-12-20 DIAGNOSIS — E039 Hypothyroidism, unspecified: Secondary | ICD-10-CM | POA: Diagnosis not present

## 2023-12-20 DIAGNOSIS — F0284 Dementia in other diseases classified elsewhere, unspecified severity, with anxiety: Secondary | ICD-10-CM | POA: Diagnosis not present

## 2023-12-20 DIAGNOSIS — I69351 Hemiplegia and hemiparesis following cerebral infarction affecting right dominant side: Secondary | ICD-10-CM | POA: Diagnosis not present

## 2023-12-20 DIAGNOSIS — F0283 Dementia in other diseases classified elsewhere, unspecified severity, with mood disturbance: Secondary | ICD-10-CM | POA: Diagnosis not present

## 2023-12-20 DIAGNOSIS — I119 Hypertensive heart disease without heart failure: Secondary | ICD-10-CM | POA: Diagnosis not present

## 2023-12-20 DIAGNOSIS — D631 Anemia in chronic kidney disease: Secondary | ICD-10-CM | POA: Diagnosis not present

## 2023-12-20 DIAGNOSIS — E1122 Type 2 diabetes mellitus with diabetic chronic kidney disease: Secondary | ICD-10-CM | POA: Diagnosis not present

## 2023-12-20 DIAGNOSIS — E1142 Type 2 diabetes mellitus with diabetic polyneuropathy: Secondary | ICD-10-CM | POA: Diagnosis not present

## 2023-12-21 DIAGNOSIS — I69322 Dysarthria following cerebral infarction: Secondary | ICD-10-CM | POA: Diagnosis not present

## 2023-12-21 DIAGNOSIS — I69391 Dysphagia following cerebral infarction: Secondary | ICD-10-CM | POA: Diagnosis not present

## 2023-12-21 DIAGNOSIS — E039 Hypothyroidism, unspecified: Secondary | ICD-10-CM | POA: Diagnosis not present

## 2023-12-21 DIAGNOSIS — E538 Deficiency of other specified B group vitamins: Secondary | ICD-10-CM | POA: Diagnosis not present

## 2023-12-21 DIAGNOSIS — I69320 Aphasia following cerebral infarction: Secondary | ICD-10-CM | POA: Diagnosis not present

## 2023-12-21 DIAGNOSIS — F0283 Dementia in other diseases classified elsewhere, unspecified severity, with mood disturbance: Secondary | ICD-10-CM | POA: Diagnosis not present

## 2023-12-23 DIAGNOSIS — I69391 Dysphagia following cerebral infarction: Secondary | ICD-10-CM | POA: Diagnosis not present

## 2023-12-23 DIAGNOSIS — I69320 Aphasia following cerebral infarction: Secondary | ICD-10-CM | POA: Diagnosis not present

## 2023-12-23 DIAGNOSIS — F0283 Dementia in other diseases classified elsewhere, unspecified severity, with mood disturbance: Secondary | ICD-10-CM | POA: Diagnosis not present

## 2023-12-23 DIAGNOSIS — E039 Hypothyroidism, unspecified: Secondary | ICD-10-CM | POA: Diagnosis not present

## 2023-12-23 DIAGNOSIS — E538 Deficiency of other specified B group vitamins: Secondary | ICD-10-CM | POA: Diagnosis not present

## 2023-12-23 DIAGNOSIS — I69322 Dysarthria following cerebral infarction: Secondary | ICD-10-CM | POA: Diagnosis not present

## 2023-12-28 DIAGNOSIS — D638 Anemia in other chronic diseases classified elsewhere: Secondary | ICD-10-CM | POA: Diagnosis not present

## 2023-12-28 DIAGNOSIS — N1832 Chronic kidney disease, stage 3b: Secondary | ICD-10-CM | POA: Diagnosis not present

## 2023-12-28 DIAGNOSIS — E039 Hypothyroidism, unspecified: Secondary | ICD-10-CM | POA: Diagnosis not present

## 2023-12-28 DIAGNOSIS — I129 Hypertensive chronic kidney disease with stage 1 through stage 4 chronic kidney disease, or unspecified chronic kidney disease: Secondary | ICD-10-CM | POA: Diagnosis not present

## 2023-12-28 DIAGNOSIS — E1165 Type 2 diabetes mellitus with hyperglycemia: Secondary | ICD-10-CM | POA: Diagnosis not present

## 2023-12-28 DIAGNOSIS — E1122 Type 2 diabetes mellitus with diabetic chronic kidney disease: Secondary | ICD-10-CM | POA: Diagnosis not present

## 2023-12-28 DIAGNOSIS — N179 Acute kidney failure, unspecified: Secondary | ICD-10-CM | POA: Diagnosis not present

## 2023-12-28 NOTE — Addendum Note (Signed)
Encounter addended by: Andee Lineman A on: 12/28/2023 9:22 AM  Actions taken: Imaging Exam ended

## 2023-12-29 DIAGNOSIS — E039 Hypothyroidism, unspecified: Secondary | ICD-10-CM | POA: Diagnosis not present

## 2023-12-29 DIAGNOSIS — I69391 Dysphagia following cerebral infarction: Secondary | ICD-10-CM | POA: Diagnosis not present

## 2023-12-29 DIAGNOSIS — E538 Deficiency of other specified B group vitamins: Secondary | ICD-10-CM | POA: Diagnosis not present

## 2023-12-29 DIAGNOSIS — I69322 Dysarthria following cerebral infarction: Secondary | ICD-10-CM | POA: Diagnosis not present

## 2023-12-29 DIAGNOSIS — I69320 Aphasia following cerebral infarction: Secondary | ICD-10-CM | POA: Diagnosis not present

## 2023-12-29 DIAGNOSIS — F0283 Dementia in other diseases classified elsewhere, unspecified severity, with mood disturbance: Secondary | ICD-10-CM | POA: Diagnosis not present

## 2023-12-30 DIAGNOSIS — F0283 Dementia in other diseases classified elsewhere, unspecified severity, with mood disturbance: Secondary | ICD-10-CM | POA: Diagnosis not present

## 2023-12-30 DIAGNOSIS — E538 Deficiency of other specified B group vitamins: Secondary | ICD-10-CM | POA: Diagnosis not present

## 2023-12-30 DIAGNOSIS — I69320 Aphasia following cerebral infarction: Secondary | ICD-10-CM | POA: Diagnosis not present

## 2023-12-30 DIAGNOSIS — I69391 Dysphagia following cerebral infarction: Secondary | ICD-10-CM | POA: Diagnosis not present

## 2023-12-30 DIAGNOSIS — E039 Hypothyroidism, unspecified: Secondary | ICD-10-CM | POA: Diagnosis not present

## 2023-12-30 DIAGNOSIS — I69322 Dysarthria following cerebral infarction: Secondary | ICD-10-CM | POA: Diagnosis not present

## 2024-01-02 DIAGNOSIS — I69322 Dysarthria following cerebral infarction: Secondary | ICD-10-CM | POA: Diagnosis not present

## 2024-01-02 DIAGNOSIS — I69320 Aphasia following cerebral infarction: Secondary | ICD-10-CM | POA: Diagnosis not present

## 2024-01-02 DIAGNOSIS — F0283 Dementia in other diseases classified elsewhere, unspecified severity, with mood disturbance: Secondary | ICD-10-CM | POA: Diagnosis not present

## 2024-01-02 DIAGNOSIS — I69391 Dysphagia following cerebral infarction: Secondary | ICD-10-CM | POA: Diagnosis not present

## 2024-01-02 DIAGNOSIS — E538 Deficiency of other specified B group vitamins: Secondary | ICD-10-CM | POA: Diagnosis not present

## 2024-01-02 DIAGNOSIS — E039 Hypothyroidism, unspecified: Secondary | ICD-10-CM | POA: Diagnosis not present

## 2024-01-03 DIAGNOSIS — E538 Deficiency of other specified B group vitamins: Secondary | ICD-10-CM | POA: Diagnosis not present

## 2024-01-03 DIAGNOSIS — I69391 Dysphagia following cerebral infarction: Secondary | ICD-10-CM | POA: Diagnosis not present

## 2024-01-03 DIAGNOSIS — E039 Hypothyroidism, unspecified: Secondary | ICD-10-CM | POA: Diagnosis not present

## 2024-01-03 DIAGNOSIS — I69322 Dysarthria following cerebral infarction: Secondary | ICD-10-CM | POA: Diagnosis not present

## 2024-01-03 DIAGNOSIS — I69320 Aphasia following cerebral infarction: Secondary | ICD-10-CM | POA: Diagnosis not present

## 2024-01-03 DIAGNOSIS — F0283 Dementia in other diseases classified elsewhere, unspecified severity, with mood disturbance: Secondary | ICD-10-CM | POA: Diagnosis not present

## 2024-01-05 DIAGNOSIS — F0283 Dementia in other diseases classified elsewhere, unspecified severity, with mood disturbance: Secondary | ICD-10-CM | POA: Diagnosis not present

## 2024-01-05 DIAGNOSIS — E039 Hypothyroidism, unspecified: Secondary | ICD-10-CM | POA: Diagnosis not present

## 2024-01-05 DIAGNOSIS — I69320 Aphasia following cerebral infarction: Secondary | ICD-10-CM | POA: Diagnosis not present

## 2024-01-05 DIAGNOSIS — I69322 Dysarthria following cerebral infarction: Secondary | ICD-10-CM | POA: Diagnosis not present

## 2024-01-05 DIAGNOSIS — E538 Deficiency of other specified B group vitamins: Secondary | ICD-10-CM | POA: Diagnosis not present

## 2024-01-05 DIAGNOSIS — I69391 Dysphagia following cerebral infarction: Secondary | ICD-10-CM | POA: Diagnosis not present

## 2024-01-09 DIAGNOSIS — E538 Deficiency of other specified B group vitamins: Secondary | ICD-10-CM | POA: Diagnosis not present

## 2024-01-09 DIAGNOSIS — E039 Hypothyroidism, unspecified: Secondary | ICD-10-CM | POA: Diagnosis not present

## 2024-01-09 DIAGNOSIS — I69322 Dysarthria following cerebral infarction: Secondary | ICD-10-CM | POA: Diagnosis not present

## 2024-01-09 DIAGNOSIS — I69320 Aphasia following cerebral infarction: Secondary | ICD-10-CM | POA: Diagnosis not present

## 2024-01-09 DIAGNOSIS — F0283 Dementia in other diseases classified elsewhere, unspecified severity, with mood disturbance: Secondary | ICD-10-CM | POA: Diagnosis not present

## 2024-01-09 DIAGNOSIS — I69391 Dysphagia following cerebral infarction: Secondary | ICD-10-CM | POA: Diagnosis not present

## 2024-01-10 DIAGNOSIS — F0283 Dementia in other diseases classified elsewhere, unspecified severity, with mood disturbance: Secondary | ICD-10-CM | POA: Diagnosis not present

## 2024-01-10 DIAGNOSIS — E538 Deficiency of other specified B group vitamins: Secondary | ICD-10-CM | POA: Diagnosis not present

## 2024-01-10 DIAGNOSIS — I69322 Dysarthria following cerebral infarction: Secondary | ICD-10-CM | POA: Diagnosis not present

## 2024-01-10 DIAGNOSIS — E039 Hypothyroidism, unspecified: Secondary | ICD-10-CM | POA: Diagnosis not present

## 2024-01-10 DIAGNOSIS — I69320 Aphasia following cerebral infarction: Secondary | ICD-10-CM | POA: Diagnosis not present

## 2024-01-10 DIAGNOSIS — I69391 Dysphagia following cerebral infarction: Secondary | ICD-10-CM | POA: Diagnosis not present

## 2024-01-14 DIAGNOSIS — N184 Chronic kidney disease, stage 4 (severe): Secondary | ICD-10-CM | POA: Diagnosis not present

## 2024-01-14 DIAGNOSIS — M1990 Unspecified osteoarthritis, unspecified site: Secondary | ICD-10-CM | POA: Diagnosis not present

## 2024-01-14 DIAGNOSIS — I69391 Dysphagia following cerebral infarction: Secondary | ICD-10-CM | POA: Diagnosis not present

## 2024-01-14 DIAGNOSIS — G959 Disease of spinal cord, unspecified: Secondary | ICD-10-CM | POA: Diagnosis not present

## 2024-01-14 DIAGNOSIS — F0284 Dementia in other diseases classified elsewhere, unspecified severity, with anxiety: Secondary | ICD-10-CM | POA: Diagnosis not present

## 2024-01-14 DIAGNOSIS — I2510 Atherosclerotic heart disease of native coronary artery without angina pectoris: Secondary | ICD-10-CM | POA: Diagnosis not present

## 2024-01-14 DIAGNOSIS — I129 Hypertensive chronic kidney disease with stage 1 through stage 4 chronic kidney disease, or unspecified chronic kidney disease: Secondary | ICD-10-CM | POA: Diagnosis not present

## 2024-01-14 DIAGNOSIS — E039 Hypothyroidism, unspecified: Secondary | ICD-10-CM | POA: Diagnosis not present

## 2024-01-14 DIAGNOSIS — J4489 Other specified chronic obstructive pulmonary disease: Secondary | ICD-10-CM | POA: Diagnosis not present

## 2024-01-14 DIAGNOSIS — E860 Dehydration: Secondary | ICD-10-CM | POA: Diagnosis not present

## 2024-01-14 DIAGNOSIS — K5730 Diverticulosis of large intestine without perforation or abscess without bleeding: Secondary | ICD-10-CM | POA: Diagnosis not present

## 2024-01-14 DIAGNOSIS — K649 Unspecified hemorrhoids: Secondary | ICD-10-CM | POA: Diagnosis not present

## 2024-01-14 DIAGNOSIS — I69320 Aphasia following cerebral infarction: Secondary | ICD-10-CM | POA: Diagnosis not present

## 2024-01-14 DIAGNOSIS — J4520 Mild intermittent asthma, uncomplicated: Secondary | ICD-10-CM | POA: Diagnosis not present

## 2024-01-14 DIAGNOSIS — E1122 Type 2 diabetes mellitus with diabetic chronic kidney disease: Secondary | ICD-10-CM | POA: Diagnosis not present

## 2024-01-14 DIAGNOSIS — E538 Deficiency of other specified B group vitamins: Secondary | ICD-10-CM | POA: Diagnosis not present

## 2024-01-14 DIAGNOSIS — I2584 Coronary atherosclerosis due to calcified coronary lesion: Secondary | ICD-10-CM | POA: Diagnosis not present

## 2024-01-14 DIAGNOSIS — E1142 Type 2 diabetes mellitus with diabetic polyneuropathy: Secondary | ICD-10-CM | POA: Diagnosis not present

## 2024-01-14 DIAGNOSIS — E1165 Type 2 diabetes mellitus with hyperglycemia: Secondary | ICD-10-CM | POA: Diagnosis not present

## 2024-01-14 DIAGNOSIS — F0283 Dementia in other diseases classified elsewhere, unspecified severity, with mood disturbance: Secondary | ICD-10-CM | POA: Diagnosis not present

## 2024-01-14 DIAGNOSIS — K259 Gastric ulcer, unspecified as acute or chronic, without hemorrhage or perforation: Secondary | ICD-10-CM | POA: Diagnosis not present

## 2024-01-14 DIAGNOSIS — D631 Anemia in chronic kidney disease: Secondary | ICD-10-CM | POA: Diagnosis not present

## 2024-01-14 DIAGNOSIS — E559 Vitamin D deficiency, unspecified: Secondary | ICD-10-CM | POA: Diagnosis not present

## 2024-01-14 DIAGNOSIS — I69322 Dysarthria following cerebral infarction: Secondary | ICD-10-CM | POA: Diagnosis not present

## 2024-01-14 DIAGNOSIS — G9389 Other specified disorders of brain: Secondary | ICD-10-CM | POA: Diagnosis not present

## 2024-01-17 DIAGNOSIS — I69322 Dysarthria following cerebral infarction: Secondary | ICD-10-CM | POA: Diagnosis not present

## 2024-01-17 DIAGNOSIS — I69391 Dysphagia following cerebral infarction: Secondary | ICD-10-CM | POA: Diagnosis not present

## 2024-01-17 DIAGNOSIS — I69320 Aphasia following cerebral infarction: Secondary | ICD-10-CM | POA: Diagnosis not present

## 2024-01-17 DIAGNOSIS — E538 Deficiency of other specified B group vitamins: Secondary | ICD-10-CM | POA: Diagnosis not present

## 2024-01-17 DIAGNOSIS — E039 Hypothyroidism, unspecified: Secondary | ICD-10-CM | POA: Diagnosis not present

## 2024-01-17 DIAGNOSIS — F0283 Dementia in other diseases classified elsewhere, unspecified severity, with mood disturbance: Secondary | ICD-10-CM | POA: Diagnosis not present

## 2024-01-24 DIAGNOSIS — E538 Deficiency of other specified B group vitamins: Secondary | ICD-10-CM | POA: Diagnosis not present

## 2024-01-24 DIAGNOSIS — E039 Hypothyroidism, unspecified: Secondary | ICD-10-CM | POA: Diagnosis not present

## 2024-01-24 DIAGNOSIS — I69391 Dysphagia following cerebral infarction: Secondary | ICD-10-CM | POA: Diagnosis not present

## 2024-01-24 DIAGNOSIS — I69320 Aphasia following cerebral infarction: Secondary | ICD-10-CM | POA: Diagnosis not present

## 2024-01-24 DIAGNOSIS — I69322 Dysarthria following cerebral infarction: Secondary | ICD-10-CM | POA: Diagnosis not present

## 2024-01-24 DIAGNOSIS — F0283 Dementia in other diseases classified elsewhere, unspecified severity, with mood disturbance: Secondary | ICD-10-CM | POA: Diagnosis not present

## 2024-01-26 DIAGNOSIS — F0283 Dementia in other diseases classified elsewhere, unspecified severity, with mood disturbance: Secondary | ICD-10-CM | POA: Diagnosis not present

## 2024-01-26 DIAGNOSIS — I69391 Dysphagia following cerebral infarction: Secondary | ICD-10-CM | POA: Diagnosis not present

## 2024-01-26 DIAGNOSIS — I69322 Dysarthria following cerebral infarction: Secondary | ICD-10-CM | POA: Diagnosis not present

## 2024-01-26 DIAGNOSIS — E039 Hypothyroidism, unspecified: Secondary | ICD-10-CM | POA: Diagnosis not present

## 2024-01-26 DIAGNOSIS — R296 Repeated falls: Secondary | ICD-10-CM | POA: Diagnosis not present

## 2024-01-26 DIAGNOSIS — Z789 Other specified health status: Secondary | ICD-10-CM | POA: Diagnosis not present

## 2024-01-26 DIAGNOSIS — Z9989 Dependence on other enabling machines and devices: Secondary | ICD-10-CM | POA: Diagnosis not present

## 2024-01-26 DIAGNOSIS — I69320 Aphasia following cerebral infarction: Secondary | ICD-10-CM | POA: Diagnosis not present

## 2024-01-26 DIAGNOSIS — E1165 Type 2 diabetes mellitus with hyperglycemia: Secondary | ICD-10-CM | POA: Diagnosis not present

## 2024-01-26 DIAGNOSIS — Z7409 Other reduced mobility: Secondary | ICD-10-CM | POA: Diagnosis not present

## 2024-01-26 DIAGNOSIS — I10 Essential (primary) hypertension: Secondary | ICD-10-CM | POA: Diagnosis not present

## 2024-01-26 DIAGNOSIS — F0390 Unspecified dementia without behavioral disturbance: Secondary | ICD-10-CM | POA: Diagnosis not present

## 2024-01-26 DIAGNOSIS — E538 Deficiency of other specified B group vitamins: Secondary | ICD-10-CM | POA: Diagnosis not present

## 2024-01-26 DIAGNOSIS — E1121 Type 2 diabetes mellitus with diabetic nephropathy: Secondary | ICD-10-CM | POA: Diagnosis not present

## 2024-01-31 DIAGNOSIS — I69320 Aphasia following cerebral infarction: Secondary | ICD-10-CM | POA: Diagnosis not present

## 2024-01-31 DIAGNOSIS — E039 Hypothyroidism, unspecified: Secondary | ICD-10-CM | POA: Diagnosis not present

## 2024-01-31 DIAGNOSIS — F0283 Dementia in other diseases classified elsewhere, unspecified severity, with mood disturbance: Secondary | ICD-10-CM | POA: Diagnosis not present

## 2024-01-31 DIAGNOSIS — E1165 Type 2 diabetes mellitus with hyperglycemia: Secondary | ICD-10-CM | POA: Diagnosis not present

## 2024-01-31 DIAGNOSIS — N1831 Chronic kidney disease, stage 3a: Secondary | ICD-10-CM | POA: Diagnosis not present

## 2024-01-31 DIAGNOSIS — I69322 Dysarthria following cerebral infarction: Secondary | ICD-10-CM | POA: Diagnosis not present

## 2024-01-31 DIAGNOSIS — I69391 Dysphagia following cerebral infarction: Secondary | ICD-10-CM | POA: Diagnosis not present

## 2024-01-31 DIAGNOSIS — E782 Mixed hyperlipidemia: Secondary | ICD-10-CM | POA: Diagnosis not present

## 2024-01-31 DIAGNOSIS — E538 Deficiency of other specified B group vitamins: Secondary | ICD-10-CM | POA: Diagnosis not present

## 2024-02-01 DIAGNOSIS — E538 Deficiency of other specified B group vitamins: Secondary | ICD-10-CM | POA: Diagnosis not present

## 2024-02-01 DIAGNOSIS — E039 Hypothyroidism, unspecified: Secondary | ICD-10-CM | POA: Diagnosis not present

## 2024-02-01 DIAGNOSIS — I69320 Aphasia following cerebral infarction: Secondary | ICD-10-CM | POA: Diagnosis not present

## 2024-02-01 DIAGNOSIS — I69322 Dysarthria following cerebral infarction: Secondary | ICD-10-CM | POA: Diagnosis not present

## 2024-02-01 DIAGNOSIS — F0283 Dementia in other diseases classified elsewhere, unspecified severity, with mood disturbance: Secondary | ICD-10-CM | POA: Diagnosis not present

## 2024-02-01 DIAGNOSIS — I69391 Dysphagia following cerebral infarction: Secondary | ICD-10-CM | POA: Diagnosis not present

## 2024-02-03 ENCOUNTER — Other Ambulatory Visit: Payer: Self-pay

## 2024-02-03 ENCOUNTER — Emergency Department (HOSPITAL_COMMUNITY)

## 2024-02-03 ENCOUNTER — Inpatient Hospital Stay (HOSPITAL_COMMUNITY)
Admission: EM | Admit: 2024-02-03 | Discharge: 2024-02-07 | DRG: 194 | Disposition: A | Discharge status: Hospice | Attending: Internal Medicine | Admitting: Internal Medicine

## 2024-02-03 ENCOUNTER — Encounter (HOSPITAL_COMMUNITY): Payer: Self-pay | Admitting: Emergency Medicine

## 2024-02-03 DIAGNOSIS — N179 Acute kidney failure, unspecified: Secondary | ICD-10-CM | POA: Diagnosis not present

## 2024-02-03 DIAGNOSIS — I159 Secondary hypertension, unspecified: Secondary | ICD-10-CM | POA: Diagnosis not present

## 2024-02-03 DIAGNOSIS — Z5982 Transportation insecurity: Secondary | ICD-10-CM

## 2024-02-03 DIAGNOSIS — M7989 Other specified soft tissue disorders: Secondary | ICD-10-CM | POA: Diagnosis present

## 2024-02-03 DIAGNOSIS — Z8249 Family history of ischemic heart disease and other diseases of the circulatory system: Secondary | ICD-10-CM

## 2024-02-03 DIAGNOSIS — Z8673 Personal history of transient ischemic attack (TIA), and cerebral infarction without residual deficits: Secondary | ICD-10-CM | POA: Diagnosis not present

## 2024-02-03 DIAGNOSIS — E1122 Type 2 diabetes mellitus with diabetic chronic kidney disease: Secondary | ICD-10-CM | POA: Diagnosis present

## 2024-02-03 DIAGNOSIS — F419 Anxiety disorder, unspecified: Secondary | ICD-10-CM | POA: Diagnosis present

## 2024-02-03 DIAGNOSIS — Z789 Other specified health status: Secondary | ICD-10-CM | POA: Diagnosis not present

## 2024-02-03 DIAGNOSIS — R001 Bradycardia, unspecified: Secondary | ICD-10-CM | POA: Diagnosis not present

## 2024-02-03 DIAGNOSIS — R4701 Aphasia: Secondary | ICD-10-CM | POA: Diagnosis not present

## 2024-02-03 DIAGNOSIS — Z711 Person with feared health complaint in whom no diagnosis is made: Secondary | ICD-10-CM | POA: Diagnosis not present

## 2024-02-03 DIAGNOSIS — I160 Hypertensive urgency: Secondary | ICD-10-CM | POA: Diagnosis not present

## 2024-02-03 DIAGNOSIS — E1165 Type 2 diabetes mellitus with hyperglycemia: Secondary | ICD-10-CM | POA: Diagnosis present

## 2024-02-03 DIAGNOSIS — E538 Deficiency of other specified B group vitamins: Secondary | ICD-10-CM | POA: Diagnosis present

## 2024-02-03 DIAGNOSIS — E039 Hypothyroidism, unspecified: Secondary | ICD-10-CM | POA: Diagnosis present

## 2024-02-03 DIAGNOSIS — Z794 Long term (current) use of insulin: Secondary | ICD-10-CM | POA: Diagnosis not present

## 2024-02-03 DIAGNOSIS — E611 Iron deficiency: Secondary | ICD-10-CM | POA: Diagnosis present

## 2024-02-03 DIAGNOSIS — E785 Hyperlipidemia, unspecified: Secondary | ICD-10-CM | POA: Diagnosis present

## 2024-02-03 DIAGNOSIS — Z515 Encounter for palliative care: Secondary | ICD-10-CM | POA: Diagnosis not present

## 2024-02-03 DIAGNOSIS — R638 Other symptoms and signs concerning food and fluid intake: Secondary | ICD-10-CM | POA: Diagnosis not present

## 2024-02-03 DIAGNOSIS — Z825 Family history of asthma and other chronic lower respiratory diseases: Secondary | ICD-10-CM | POA: Diagnosis not present

## 2024-02-03 DIAGNOSIS — Z7189 Other specified counseling: Secondary | ICD-10-CM | POA: Diagnosis not present

## 2024-02-03 DIAGNOSIS — J101 Influenza due to other identified influenza virus with other respiratory manifestations: Principal | ICD-10-CM

## 2024-02-03 DIAGNOSIS — F0390 Unspecified dementia without behavioral disturbance: Secondary | ICD-10-CM | POA: Diagnosis not present

## 2024-02-03 DIAGNOSIS — I959 Hypotension, unspecified: Secondary | ICD-10-CM | POA: Diagnosis not present

## 2024-02-03 DIAGNOSIS — Z888 Allergy status to other drugs, medicaments and biological substances status: Secondary | ICD-10-CM

## 2024-02-03 DIAGNOSIS — N184 Chronic kidney disease, stage 4 (severe): Secondary | ICD-10-CM | POA: Diagnosis present

## 2024-02-03 DIAGNOSIS — Z883 Allergy status to other anti-infective agents status: Secondary | ICD-10-CM

## 2024-02-03 DIAGNOSIS — I517 Cardiomegaly: Secondary | ICD-10-CM | POA: Diagnosis not present

## 2024-02-03 DIAGNOSIS — Z91018 Allergy to other foods: Secondary | ICD-10-CM

## 2024-02-03 DIAGNOSIS — Z833 Family history of diabetes mellitus: Secondary | ICD-10-CM | POA: Diagnosis not present

## 2024-02-03 DIAGNOSIS — Z881 Allergy status to other antibiotic agents status: Secondary | ICD-10-CM | POA: Diagnosis not present

## 2024-02-03 DIAGNOSIS — Z1152 Encounter for screening for COVID-19: Secondary | ICD-10-CM | POA: Diagnosis not present

## 2024-02-03 DIAGNOSIS — R0989 Other specified symptoms and signs involving the circulatory and respiratory systems: Secondary | ICD-10-CM | POA: Diagnosis not present

## 2024-02-03 DIAGNOSIS — N183 Type 2 diabetes mellitus with diabetic chronic kidney disease: Secondary | ICD-10-CM | POA: Diagnosis present

## 2024-02-03 DIAGNOSIS — R55 Syncope and collapse: Secondary | ICD-10-CM | POA: Diagnosis not present

## 2024-02-03 DIAGNOSIS — E860 Dehydration: Secondary | ICD-10-CM | POA: Diagnosis present

## 2024-02-03 DIAGNOSIS — R404 Transient alteration of awareness: Secondary | ICD-10-CM | POA: Diagnosis not present

## 2024-02-03 DIAGNOSIS — Z66 Do not resuscitate: Secondary | ICD-10-CM | POA: Diagnosis present

## 2024-02-03 DIAGNOSIS — I129 Hypertensive chronic kidney disease with stage 1 through stage 4 chronic kidney disease, or unspecified chronic kidney disease: Secondary | ICD-10-CM | POA: Diagnosis not present

## 2024-02-03 DIAGNOSIS — Z961 Presence of intraocular lens: Secondary | ICD-10-CM | POA: Diagnosis present

## 2024-02-03 DIAGNOSIS — Z79890 Hormone replacement therapy (postmenopausal): Secondary | ICD-10-CM

## 2024-02-03 DIAGNOSIS — Z90710 Acquired absence of both cervix and uterus: Secondary | ICD-10-CM

## 2024-02-03 DIAGNOSIS — R531 Weakness: Secondary | ICD-10-CM | POA: Diagnosis not present

## 2024-02-03 DIAGNOSIS — N1831 Chronic kidney disease, stage 3a: Secondary | ICD-10-CM | POA: Diagnosis present

## 2024-02-03 DIAGNOSIS — Z79899 Other long term (current) drug therapy: Secondary | ICD-10-CM

## 2024-02-03 DIAGNOSIS — Z7982 Long term (current) use of aspirin: Secondary | ICD-10-CM

## 2024-02-03 DIAGNOSIS — E1142 Type 2 diabetes mellitus with diabetic polyneuropathy: Secondary | ICD-10-CM | POA: Diagnosis not present

## 2024-02-03 DIAGNOSIS — Z91040 Latex allergy status: Secondary | ICD-10-CM

## 2024-02-03 DIAGNOSIS — Z0389 Encounter for observation for other suspected diseases and conditions ruled out: Secondary | ICD-10-CM | POA: Diagnosis not present

## 2024-02-03 DIAGNOSIS — I10 Essential (primary) hypertension: Secondary | ICD-10-CM | POA: Diagnosis not present

## 2024-02-03 DIAGNOSIS — Z823 Family history of stroke: Secondary | ICD-10-CM

## 2024-02-03 DIAGNOSIS — Z841 Family history of disorders of kidney and ureter: Secondary | ICD-10-CM

## 2024-02-03 DIAGNOSIS — Z7401 Bed confinement status: Secondary | ICD-10-CM | POA: Diagnosis not present

## 2024-02-03 DIAGNOSIS — R059 Cough, unspecified: Secondary | ICD-10-CM | POA: Diagnosis not present

## 2024-02-03 DIAGNOSIS — Z8711 Personal history of peptic ulcer disease: Secondary | ICD-10-CM

## 2024-02-03 LAB — RESP PANEL BY RT-PCR (RSV, FLU A&B, COVID)  RVPGX2
Influenza A by PCR: POSITIVE — AB
Influenza B by PCR: NEGATIVE
Resp Syncytial Virus by PCR: NEGATIVE
SARS Coronavirus 2 by RT PCR: NEGATIVE

## 2024-02-03 LAB — CBC WITH DIFFERENTIAL/PLATELET
Abs Immature Granulocytes: 0.03 10*3/uL (ref 0.00–0.07)
Basophils Absolute: 0.1 10*3/uL (ref 0.0–0.1)
Basophils Relative: 1 %
Eosinophils Absolute: 0.1 10*3/uL (ref 0.0–0.5)
Eosinophils Relative: 2 %
HCT: 31.9 % — ABNORMAL LOW (ref 36.0–46.0)
Hemoglobin: 10.7 g/dL — ABNORMAL LOW (ref 12.0–15.0)
Immature Granulocytes: 0 %
Lymphocytes Relative: 15 %
Lymphs Abs: 1.3 10*3/uL (ref 0.7–4.0)
MCH: 29.6 pg (ref 26.0–34.0)
MCHC: 33.5 g/dL (ref 30.0–36.0)
MCV: 88.1 fL (ref 80.0–100.0)
Monocytes Absolute: 0.5 10*3/uL (ref 0.1–1.0)
Monocytes Relative: 6 %
Neutro Abs: 6.4 10*3/uL (ref 1.7–7.7)
Neutrophils Relative %: 76 %
Platelets: 215 10*3/uL (ref 150–400)
RBC: 3.62 MIL/uL — ABNORMAL LOW (ref 3.87–5.11)
RDW: 13.2 % (ref 11.5–15.5)
WBC: 8.5 10*3/uL (ref 4.0–10.5)
nRBC: 0 % (ref 0.0–0.2)

## 2024-02-03 LAB — BASIC METABOLIC PANEL
Anion gap: 6 (ref 5–15)
BUN: 37 mg/dL — ABNORMAL HIGH (ref 8–23)
CO2: 23 mmol/L (ref 22–32)
Calcium: 8.4 mg/dL — ABNORMAL LOW (ref 8.9–10.3)
Chloride: 110 mmol/L (ref 98–111)
Creatinine, Ser: 2.4 mg/dL — ABNORMAL HIGH (ref 0.44–1.00)
GFR, Estimated: 19 mL/min — ABNORMAL LOW (ref >60)
Glucose, Bld: 108 mg/dL — ABNORMAL HIGH (ref 70–99)
Potassium: 3.6 mmol/L (ref 3.5–5.1)
Sodium: 139 mmol/L (ref 135–145)

## 2024-02-03 LAB — TROPONIN I (HIGH SENSITIVITY): Troponin I (High Sensitivity): 14 ng/L (ref <18)

## 2024-02-03 LAB — BRAIN NATRIURETIC PEPTIDE: B Natriuretic Peptide: 40.5 pg/mL (ref 0.0–100.0)

## 2024-02-03 MED ORDER — LABETALOL HCL 5 MG/ML IV SOLN
20.0000 mg | Freq: Once | INTRAVENOUS | Status: AC
  Administered 2024-02-03: 20 mg via INTRAVENOUS
  Filled 2024-02-03: qty 4

## 2024-02-03 MED ORDER — SODIUM CHLORIDE 0.9 % IV BOLUS
500.0000 mL | Freq: Once | INTRAVENOUS | Status: AC
  Administered 2024-02-03: 500 mL via INTRAVENOUS

## 2024-02-03 MED ORDER — AMLODIPINE BESYLATE 5 MG PO TABS
10.0000 mg | ORAL_TABLET | Freq: Once | ORAL | Status: AC
  Administered 2024-02-03: 10 mg via ORAL
  Filled 2024-02-03: qty 2

## 2024-02-03 MED ORDER — LABETALOL HCL 5 MG/ML IV SOLN
10.0000 mg | Freq: Once | INTRAVENOUS | Status: AC
  Administered 2024-02-03: 10 mg via INTRAVENOUS
  Filled 2024-02-03: qty 4

## 2024-02-03 NOTE — ED Provider Notes (Addendum)
 [place] EMERGENCY DEPARTMENT AT [facility] Provider Note   CSN: [id number] Arrival date & time: 02/03/24  1639     History  Chief Complaint  Patient presents with   Loss of Consciousness    Diana Harper is a 88 y.o. female.  HPI   88 year old female with known vasovagal episodes presents emergency department after syncopal episode.  Patient is accompanied by her caregiver, daughter.  She was with the patient during this episode.  She states that the patient was sitting on the toilet, straining when she started to go out.  This has happened before in the same fashion.  The daughter was able to lay the patient down onto the ground and called 911.  At that time she felt like she possibly did not have a pulse and so they instructed her to start chest compressions but when she did this the patient became alert.  Daughter admits that she has had none of her medications today including her hypertensive medications.  She has had a recent sick contact with the flu and has had a runny nose but otherwise no acute changes in her health.  Patient at this time denies any chest pain, headache, neurosymptoms.  Home Medications Prior to Admission medications   Medication Sig Start Date End Date Taking? Authorizing Provider  acetaminophen (TYLENOL) 500 MG tablet Take 1,000 mg by mouth every 8 (eight) hours as needed for mild pain, headache or fever.    [provider]  albuterol (VENTOLIN HFA) 108 (90 Base) MCG/ACT inhaler Inhale 2 puffs into the lungs every 4 (four) hours as needed for wheezing or shortness of breath.    [provider]  amLODipine (NORVASC) 10 MG tablet Take 1 tablet (10 mg total) by mouth daily. 12/02/23   Berton Mount I, MD  ARIPiprazole (ABILIFY) 2 MG tablet Take 2 mg by mouth daily.    [provider]  aspirin EC 81 MG tablet Take 1 tablet (81 mg total) by mouth daily. Swallow whole. Patient taking differently: Take 81 mg by mouth as  needed. Swallow whole. 10/15/23   Joseph Art, DO  atorvastatin (LIPITOR) 40 MG tablet Take 1 tablet (40 mg total) by mouth daily. 10/15/23   Joseph Art, DO  carvedilol (COREG) 12.5 MG tablet Take 1 tablet (12.5 mg total) by mouth 2 (two) times daily with a meal. 12/02/23   Barnetta Chapel, MD  Cholecalciferol (VITAMIN D3) 125 MCG (5000 UT) CAPS Take 5,000 Units by mouth daily.    [provider]  Continuous Blood Gluc Receiver (FREESTYLE LIBRE 2 READER) DEVI See admin instructions. 10/13/22   [provider]  Continuous Blood Gluc Sensor (FREESTYLE LIBRE 14 DAY SENSOR) MISC Inject 1 Device into the skin every 14 (fourteen) days.    [provider]  D-Mannose 500 MG CAPS Take 500 mg by mouth daily. Patient not taking: Reported on 11/16/2023    [provider]  diclofenac Sodium (VOLTAREN) 1 % GEL Apply 2 g topically 4 (four) times daily as needed (joint pain).    [provider]  docusate sodium (COLACE) 100 MG capsule Take 1 capsule (100 mg total) by mouth 2 (two) times daily. 12/02/23   Berton Mount I, MD  Glucagon HCl 1 MG SOLR Inject 1 mg into the skin as needed (for a blood sugar <67).    [provider]  insulin degludec (TRESIBA) 200 UNIT/ML FlexTouch Pen Inject 10 Units into the skin See admin instructions. 50 units  SQ once daily if BG is greater than 150 12/02/23   Barnetta Chapel, MD  levothyroxine (SYNTHROID) 88 MCG tablet Take 88 mcg by mouth daily before breakfast.    [provider]  nitroGLYCERIN (NITROSTAT) 0.4 MG SL tablet Place 1 tablet (0.4 mg total) under the tongue every 5 (five) minutes as needed for chest pain. 01/20/21 10/07/24  Cantwell, Celeste C, PA-C  NOVOLOG FLEXPEN 100 UNIT/ML FlexPen Inject 5-10 Units into the skin See admin instructions. Inject 5 units into the skin before meals if BGL is 100-150, 7 units if BGL is 151-200, and 10 units if BGL is greater than 200 05/21/22   [provider]  polyethylene glycol (MIRALAX / GLYCOLAX) 17 g packet Take 17 g by mouth daily as needed for mild constipation. 12/02/23   Berton Mount I, MD  sertraline (ZOLOFT) 50 MG tablet Take 50 mg by mouth at bedtime.    [provider]      Allergies    Latex, Onion, Other, Flexeril [cyclobenzaprine], Lisinopril, Metformin and related, Simvastatin, Tetracycline, and Zithromax [azithromycin]    Review of Systems   Review of Systems  Constitutional:  Negative for fever.  Respiratory:  Negative for shortness of breath.   Cardiovascular:  Negative for chest pain.  Gastrointestinal:  Negative for abdominal pain.  Genitourinary:  Negative for flank pain.  Musculoskeletal:  Negative for back pain.  Neurological:  Negative for numbness and headaches.    Physical Exam Updated Vital Signs BP (!) 205/84 Comment: DO bedside  Pulse 71   Temp 98 F (36.7 C) (Oral)   Resp 20   SpO2 98%  Physical Exam Vitals and nursing note reviewed.  Constitutional:      General: She is not in acute distress.    Appearance: Normal appearance.  HENT:     Head: Normocephalic.     Mouth/Throat:     Mouth: Mucous membranes are moist.  Eyes:     Extraocular Movements: Extraocular movements intact.     Pupils: Pupils are equal, round, and reactive to light.  Cardiovascular:     Rate and Rhythm: Normal rate.  Pulmonary:     Effort: Pulmonary effort is normal. No respiratory distress.  Abdominal:     Palpations: Abdomen is soft.     Tenderness: There is no abdominal tenderness.  Musculoskeletal:        General: No swelling.     Cervical back: No rigidity.  Skin:    General: Skin is warm.  Neurological:     Mental Status: She is alert and oriented to person, place, and time. Mental status is at baseline.  Psychiatric:        Mood and Affect: Mood normal.     ED Results / Procedures / Treatments   Labs (all labs ordered are listed, but only abnormal results are displayed) Labs Reviewed   RESP PANEL BY RT-PCR (RSV, FLU A&B, COVID)  [id number]  CBC WITH DIFFERENTIAL/PLATELET  BASIC METABOLIC PANEL    EKG EKG Interpretation Date/Time:  Friday February 03 2024 19:04:22 EST Ventricular Rate:  72 PR Interval:  161 QRS Duration:  82 QT Interval:  374 QTC Calculation: 410 R Axis:   101  Text Interpretation: Sinus rhythm Right axis deviation Anteroseptal infarct, old Minimal ST depression, lateral leads Confirmed by Coralee Pesa [phone]) on 02/03/2024 7:16:34 PM  Radiology No results found.  Procedures .Critical Care  Performed by: Rozelle Logan, DO Authorized by: Rozelle Logan, DO   Critical  care provider statement:    Critical care time (minutes):  30   Critical care was necessary to treat or prevent imminent or life-threatening deterioration of the following conditions:  Cardiac failure and endocrine crisis   Critical care was time spent personally by me on the following activities:  Development of treatment plan with patient or surrogate, discussions with consultants, evaluation of patient's response to treatment, examination of patient, ordering and review of laboratory studies, ordering and review of radiographic studies, ordering and performing treatments and interventions, pulse oximetry, re-evaluation of patient's condition and review of old charts   I assumed direction of critical care for this patient from another provider in my specialty: no     Care discussed with: admitting provider       Medications Ordered in ED Medications  labetalol (NORMODYNE) injection 10 mg (has no administration in time range)  amLODipine (NORVASC) tablet 10 mg (has no administration in time range)    ED Course/ Medical Decision Making/ A&P                                 Medical Decision Making Amount and/or Complexity of Data Reviewed Labs: ordered. Radiology: ordered.  Risk Prescription drug management. Decision regarding hospitalization.   88 year old female  presents to the emergency department accompanied by daughter who is her caregiver after a vasovagal episode.  Patient was sitting on the toilet, straining when she passed out.  This is a known occurrence for the patient.  She has not taken any of her medication here today.  Patient is a poor historian, history obtained from daughter at bedside.  She is hypertensive on arrival, daughter mentions an episode of chest pain prior to arrival, otherwise vitals are baseline.  EKG is sinus rhythm, similar morphology to previous.  Blood work shows a baseline hemoglobin, she is flu positive and she has a worsening AKI.  Patient is hypertensive with systolics over 200s, IV medicine and a dose of her home medicine given with slight improvement.  However in the setting of HTN and renal dysfunction question hypertensive urgency/emergency.  However patient has had a decreased p.o. intake, most likely secondary from influenza A.  Added on chest x-ray, troponin and BNP.  Given the above findings patient will be admitted for blood pressure control, hydration and further evaluation.  Patients evaluation and results requires admission for further treatment and care.  Spoke with hospitalist, reviewed patient's ED course and they accept admission.  Patient agrees with admission plan, offers no new complaints and is stable/unchanged at time of admit.   Of note the daughter has left bedside.  I attempted to call her multiple times with no answer to notify her of decision for admission.     Final Clinical Impression(s) / ED Diagnoses Final diagnoses:  None    Rx / DC Orders ED Discharge Orders     None         Rozelle Logan, DO 02/03/24 2326    Rozelle Logan, DO 02/03/24 2351

## 2024-02-03 NOTE — ED Triage Notes (Signed)
 Patient presents due to a vagal episode this afternoon. Patient was on the toilet when she went unresponsive. Caregiver moved her to the floor and started chest compressions. Compressions were very brief due to the patient becoming more alert quickly. Patient now complains of chest tenderness. Patient has had previous vagal episodes.    EMS vitals: 205/165 BP 59 Pulse 22 RR 99% SPO2 on room air 122 CBG 95.1 Temp

## 2024-02-03 NOTE — H&P (Signed)
 History and Physical    Patient: Diana Harper ZOX:[id number] DOB: 05/13/1936 DOA: 02/03/2024 DOS: the patient was seen and examined on 02/03/2024 PCP: Lewis Moccasin, MD  Patient coming from: Home  Chief Complaint:  Chief Complaint  Patient presents with   Loss of Consciousness   HPI: Diana Harper is a 88 y.o. female with medical history significant of type 2 diabetes, chronic kidney disease stage III, essential hypertension, recurrent vasovagal syncope, diverticulosis, history of asthma, history of biliary colic, hyperlipidemia, history of CVA with intracranial bleeds previously who was brought in by family secondary to another episode of syncope.  She had syncope that was suspected to be another vagal episode this afternoon.  She was on the toilet when she went unresponsive.  The caregiver went and started CPR with chest compressions.  She became alert quickly.  She is having chest tenderness after that.  Seen in the ER and evaluated.  Was found to be in hypertensive urgency with systolic blood pressure more than 200.  Patient did not take any of her medications today.  She was also found to be influenza A positive.  Her renal function has slightly worsened.  Chest x-ray showed no infiltrates.  Patient is therefore being admitted with syncope, AKI on CKD 3 as well as influenza A infection.  Review of Systems: As mentioned in the history of present illness. All other systems reviewed and are negative. Past Medical History:  Diagnosis Date   Anemia    Anxiety    Arthritis    Asthma    Biliary colic    C. difficile colitis 08/2022   Cholelithiasis    CKD (chronic kidney disease), stage III [facility])    nephrologist--  dr Reynolds Bowl   Colon polyps    Diverticulosis of colon    Gastric ulcer    Glaucoma    History of asthma    1980's  no longer problem since 1980's   History of diverticulitis of colon    recurrent--  2014;  2013;  2012   Hyperlipidemia    Hypertension     Hypothyroidism    Insulin dependent type 2 diabetes mellitus (HCC)    followed by dr Jonny Ruiz lambeth ([facility])    Murmur    Peripheral neuropathy    Stroke [facility])    Vitamin D deficiency    Wears hearing aid    bilateral   Wears partial dentures    lower partial and upper full   Past Surgical History:  Procedure Laterality Date   ABDOMINAL HYSTERECTOMY  1970's   CARDIOVASCULAR STRESS TEST  07/30/2009   normal exercise lexiscan nuclear study w/ no ischemia/  normal LV funciton and wall motion, ef 77%   CATARACT EXTRACTION W/ INTRAOCULAR LENS  IMPLANT, BILATERAL  2014   CATARACT EXTRACTION, BILATERAL Bilateral 2021   LAPAROSCOPIC CHOLECYSTECTOMY SINGLE SITE WITH INTRAOPERATIVE CHOLANGIOGRAM N/A 08/25/2016   Procedure: LAPAROSCOPIC CHOLECYSTECTOMY WITH INTRAOPERATIVE CHOLANGIOGRAM;  Surgeon: Rodman Pickle, MD;  Location: [facility];  Service: General;  Laterality: N/A;   LUMBAR SPINE SURGERY  2014   REMOVAL AXILLA CYST Right 1990's   Social History:  reports that she has never smoked. She has never used smokeless tobacco. She reports that she does not drink alcohol and does not use drugs.  Allergies  Allergen Reactions   Latex Anaphylaxis, Shortness Of Breath and Other (See Comments)    Severe respiratory distress   Onion Diarrhea   Other Other (See Comments)    Patient cannot  have TREE NUTS and anything with seeds- History of  diverticulitis    Flexeril [Cyclobenzaprine] Other (See Comments)    Caused agitation   Lisinopril Cough   Metformin And Related Diarrhea   Simvastatin Hives and Rash   Tetracycline Rash   Zithromax [Azithromycin] Rash    Family History  Problem Relation Age of Onset   Stroke Mother    Colon polyps Mother    Diabetes Mother    Diabetes Father    Kidney failure Father    COPD Father    Colon polyps Sister    Diabetes Sister    Stroke Brother    Diabetes Brother    Heart attack Maternal Grandmother    Heart disease Maternal  Grandfather    Diabetes Paternal Grandmother    Stroke Paternal Grandfather    Hypertension Paternal Grandfather    Diabetes Sister    Diabetes Sister    Diabetes Son    Diabetes Daughter    Colon cancer Neg Hx    Esophageal cancer Neg Hx    Inflammatory bowel disease Neg Hx    Liver disease Neg Hx    Pancreatic cancer Neg Hx    Rectal cancer Neg Hx    Stomach cancer Neg Hx     Prior to Admission medications   Medication Sig Start Date End Date Taking? Authorizing Provider  carvedilol (COREG) 6.25 MG tablet Take 6.25 mg by mouth 2 (two) times daily. 12/31/23  Yes [provider]  acetaminophen (TYLENOL) 500 MG tablet Take 1,000 mg by mouth every 8 (eight) hours as needed for mild pain, headache or fever.    [provider]  albuterol (VENTOLIN HFA) 108 (90 Base) MCG/ACT inhaler Inhale 2 puffs into the lungs every 4 (four) hours as needed for wheezing or shortness of breath.    [provider]  amLODipine (NORVASC) 10 MG tablet Take 1 tablet (10 mg total) by mouth daily. 12/02/23   Berton Mount I, MD  ARIPiprazole (ABILIFY) 2 MG tablet Take 2 mg by mouth daily.    [provider]  aspirin EC 81 MG tablet Take 1 tablet (81 mg total) by mouth daily. Swallow whole. Patient taking differently: Take 81 mg by mouth as needed. Swallow whole. 10/15/23   Joseph Art, DO  atorvastatin (LIPITOR) 40 MG tablet Take 1 tablet (40 mg total) by mouth daily. 10/15/23   Joseph Art, DO  carvedilol (COREG) 12.5 MG tablet Take 1 tablet (12.5 mg total) by mouth 2 (two) times daily with a meal. 12/02/23   Barnetta Chapel, MD  Cholecalciferol (VITAMIN D3) 125 MCG (5000 UT) CAPS Take 5,000 Units by mouth daily.    [provider]  Continuous Blood Gluc Receiver (FREESTYLE LIBRE 2 READER) DEVI See admin instructions. 10/13/22   [provider]  Continuous Blood Gluc Sensor (FREESTYLE LIBRE 14 DAY SENSOR) MISC Inject 1 Device into the skin every 14  (fourteen) days.    [provider]  D-Mannose 500 MG CAPS Take 500 mg by mouth daily. Patient not taking: Reported on 11/16/2023    [provider]  diclofenac Sodium (VOLTAREN) 1 % GEL Apply 2 g topically 4 (four) times daily as needed (joint pain).    [provider]  docusate sodium (COLACE) 100 MG capsule Take 1 capsule (100 mg total) by mouth 2 (two) times daily. 12/02/23   Berton Mount I, MD  Glucagon HCl 1 MG SOLR Inject 1 mg into the skin as needed (for  a blood sugar <67).    [provider]  insulin degludec (TRESIBA) 200 UNIT/ML FlexTouch Pen Inject 10 Units into the skin See admin instructions. 50 units SQ once daily if BG is greater than 150 12/02/23   Barnetta Chapel, MD  levothyroxine (SYNTHROID) 88 MCG tablet Take 88 mcg by mouth daily before breakfast.    [provider]  nitroGLYCERIN (NITROSTAT) 0.4 MG SL tablet Place 1 tablet (0.4 mg total) under the tongue every 5 (five) minutes as needed for chest pain. 01/20/21 10/07/24  Cantwell, Celeste C, PA-C  NOVOLOG FLEXPEN 100 UNIT/ML FlexPen Inject 5-10 Units into the skin See admin instructions. Inject 5 units into the skin before meals if BGL is 100-150, 7 units if BGL is 151-200, and 10 units if BGL is greater than 200 05/21/22   [provider]  polyethylene glycol (MIRALAX / GLYCOLAX) 17 g packet Take 17 g by mouth daily as needed for mild constipation. 12/02/23   Berton Mount I, MD  sertraline (ZOLOFT) 50 MG tablet Take 50 mg by mouth at bedtime.    [provider]    Physical Exam: Vitals:   02/03/24 2200 02/03/24 2230 02/03/24 2300 02/03/24 2330  BP: (!) 197/87 (!) 184/76 (!) 192/71 (!) 160/55  Pulse: 79 81 79 81  Resp: 20   19  Temp:      TempSrc:      SpO2: 100% 100% 96% 97%   Constitutional: Acutely ill looking, NAD, calm, comfortable Eyes: PERRL, lids and conjunctivae normal ENMT: Mucous membranes are moist. Posterior pharynx clear of any exudate  or lesions.Normal dentition.  Neck: normal, supple, no masses, no thyromegaly Respiratory: Coarse breath sounds bilaterally, no wheezing, no crackles. Normal respiratory effort. No accessory muscle use.  Cardiovascular: Regular rate and rhythm, no murmurs / rubs / gallops. No extremity edema. 2+ pedal pulses. No carotid bruits.  Abdomen: no tenderness, no masses palpated. No hepatosplenomegaly. Bowel sounds positive.  Musculoskeletal: Good range of motion, no joint swelling or tenderness, Skin: no rashes, lesions, ulcers. No induration Neurologic: CN 2-12 grossly intact. Sensation intact, DTR normal. Strength 5/5 in all 4.  Psychiatric: Normal judgment and insight. Alert and oriented x 3. Normal mood  Data Reviewed:  Temperature 98.5, blood pressure 217/87, pulse 81 respiration 19.  Hemoglobin is 10.7 BUN 37 creatinine 2.40 calcium 8.4.  Troponin 14.  Influenza is positive  Assessment and Plan:  #1 recurrent syncope: Patient has been having vasovagal syncope and had syncopal episode today which may be related to the influenza A infection.  She is also dehydrated.  Patient will be admitted.  PT evaluation.  Hydrate.  Continue with supportive care.  #2 hypertensive urgency: Patient has responded to beta-blockers.  Restart her home regimen.  May increase beta-blocker dose and avoid diuretics since she is already dehydrated.  Continue to monitor.  #3 history of CVA: Stable.  Continue monitoring  #4 uncontrolled type 2 diabetes: Initiate sliding scale insulin.  #5 influenza A infection: Initiate Tamiflu and supportive care.  #6 acute on chronic kidney disease stage III: Continue with monitoring.  #7 hypothyroidism: Continue levothyroxine  #8 dementia: Appears to be at baseline.  Will continue to monitor  #9 normocytic anemia: Appears to be of chronic disease.    Advance Care Planning:   Code Status: Full Code   Consults: None  Family Communication: Daughter at bedside  Severity of  Illness: The appropriate patient status for this patient is INPATIENT. Inpatient status is judged to be reasonable  and necessary in order to provide the required intensity of service to ensure the patient's safety. The patient's presenting symptoms, physical exam findings, and initial radiographic and laboratory data in the context of their chronic comorbidities is felt to place them at high risk for further clinical deterioration. Furthermore, it is not anticipated that the patient will be medically stable for discharge from the hospital within 2 midnights of admission.   * I certify that at the point of admission it is my clinical judgment that the patient will require inpatient hospital care spanning beyond 2 midnights from the point of admission due to high intensity of service, high risk for further deterioration and high frequency of surveillance required.*  AuthorLonia Blood, MD 02/03/2024 11:58 PM  For on call review [email or web address].

## 2024-02-03 NOTE — H&P (Incomplete)
 History and Physical    Patient: Diana Harper UJW:[id number] DOB: 01/30/1936 DOA: 02/03/2024 DOS: the patient was seen and examined on 02/03/2024 PCP: Lewis Moccasin, MD  Patient coming from: Home  Chief Complaint:  Chief Complaint  Patient presents with  . Loss of Consciousness   HPI: Diana Harper is a 88 y.o. female with medical history significant of type 2 diabetes, chronic kidney disease stage III, essential hypertension, recurrent vasovagal syncope, diverticulosis, history of asthma, history of biliary colic, hyperlipidemia, history of CVA with intracranial bleeds previously who was brought in by family secondary to another episode of syncope.  She had syncope that was suspected to be another vagal episode this afternoon.  She was on the toilet when she went unresponsive.  The caregiver went and started CPR with chest compressions.  She became alert quickly.  She is having chest tenderness after that.  Seen in the ER and evaluated.  Was found to be in hypertensive urgency with systolic blood pressure more than 200.  Patient did not take any of her medications today.  She was also found to be influenza A positive.  Her renal function has slightly worsened.  Chest x-ray showed no infiltrates.  Patient is therefore being admitted with syncope, AKI on CKD 3 as well as influenza A infection.  Review of Systems: As mentioned in the history of present illness. All other systems reviewed and are negative. Past Medical History:  Diagnosis Date  . Anemia   . Anxiety   . Arthritis   . Asthma   . Biliary colic   . C. difficile colitis 08/2022  . Cholelithiasis   . CKD (chronic kidney disease), stage III [facility])    nephrologist--  dr Reynolds Bowl  . Colon polyps   . Diverticulosis of colon   . Gastric ulcer   . Glaucoma   . History of asthma    1980's  no longer problem since 1980's  . History of diverticulitis of colon    recurrent--  2014;  2013;  2012  . Hyperlipidemia   .  Hypertension   . Hypothyroidism   . Insulin dependent type 2 diabetes mellitus (HCC)    followed by dr Jonny Ruiz lambeth ([facility])   . Murmur   . Peripheral neuropathy   . Stroke (HCC)   . Vitamin D deficiency   . Wears hearing aid    bilateral  . Wears partial dentures    lower partial and upper full   Past Surgical History:  Procedure Laterality Date  . ABDOMINAL HYSTERECTOMY  1970's  . CARDIOVASCULAR STRESS TEST  07/30/2009   normal exercise lexiscan nuclear study w/ no ischemia/  normal LV funciton and wall motion, ef 77%  . CATARACT EXTRACTION W/ INTRAOCULAR LENS  IMPLANT, BILATERAL  2014  . CATARACT EXTRACTION, BILATERAL Bilateral 2021  . LAPAROSCOPIC CHOLECYSTECTOMY SINGLE SITE WITH INTRAOPERATIVE CHOLANGIOGRAM N/A 08/25/2016   Procedure: LAPAROSCOPIC CHOLECYSTECTOMY WITH INTRAOPERATIVE CHOLANGIOGRAM;  Surgeon: Rodman Pickle, MD;  Location: [facility];  Service: General;  Laterality: N/A;  . LUMBAR SPINE SURGERY  2014  . REMOVAL AXILLA CYST Right 1990's   Social History:  reports that she has never smoked. She has never used smokeless tobacco. She reports that she does not drink alcohol and does not use drugs.  Allergies  Allergen Reactions  . Latex Anaphylaxis, Shortness Of Breath and Other (See Comments)    Severe respiratory distress  . Onion Diarrhea  . Other Other (See Comments)    Patient cannot  have TREE NUTS and anything with seeds- History of  diverticulitis   . Flexeril [Cyclobenzaprine] Other (See Comments)    Caused agitation  . Lisinopril Cough  . Metformin And Related Diarrhea  . Simvastatin Hives and Rash  . Tetracycline Rash  . Zithromax [Azithromycin] Rash    Family History  Problem Relation Age of Onset  . Stroke Mother   . Colon polyps Mother   . Diabetes Mother   . Diabetes Father   . Kidney failure Father   . COPD Father   . Colon polyps Sister   . Diabetes Sister   . Stroke Brother   . Diabetes Brother   . Heart  attack Maternal Grandmother   . Heart disease Maternal Grandfather   . Diabetes Paternal Grandmother   . Stroke Paternal Grandfather   . Hypertension Paternal Grandfather   . Diabetes Sister   . Diabetes Sister   . Diabetes Son   . Diabetes Daughter   . Colon cancer Neg Hx   . Esophageal cancer Neg Hx   . Inflammatory bowel disease Neg Hx   . Liver disease Neg Hx   . Pancreatic cancer Neg Hx   . Rectal cancer Neg Hx   . Stomach cancer Neg Hx     Prior to Admission medications   Medication Sig Start Date End Date Taking? Authorizing Provider  carvedilol (COREG) 6.25 MG tablet Take 6.25 mg by mouth 2 (two) times daily. 12/31/23  Yes [provider]  acetaminophen (TYLENOL) 500 MG tablet Take 1,000 mg by mouth every 8 (eight) hours as needed for mild pain, headache or fever.    [provider]  albuterol (VENTOLIN HFA) 108 (90 Base) MCG/ACT inhaler Inhale 2 puffs into the lungs every 4 (four) hours as needed for wheezing or shortness of breath.    [provider]  amLODipine (NORVASC) 10 MG tablet Take 1 tablet (10 mg total) by mouth daily. 12/02/23   Berton Mount I, MD  ARIPiprazole (ABILIFY) 2 MG tablet Take 2 mg by mouth daily.    [provider]  aspirin EC 81 MG tablet Take 1 tablet (81 mg total) by mouth daily. Swallow whole. Patient taking differently: Take 81 mg by mouth as needed. Swallow whole. 10/15/23   Joseph Art, DO  atorvastatin (LIPITOR) 40 MG tablet Take 1 tablet (40 mg total) by mouth daily. 10/15/23   Joseph Art, DO  carvedilol (COREG) 12.5 MG tablet Take 1 tablet (12.5 mg total) by mouth 2 (two) times daily with a meal. 12/02/23   Barnetta Chapel, MD  Cholecalciferol (VITAMIN D3) 125 MCG (5000 UT) CAPS Take 5,000 Units by mouth daily.    [provider]  Continuous Blood Gluc Receiver (FREESTYLE LIBRE 2 READER) DEVI See admin instructions. 10/13/22   [provider]  Continuous Blood Gluc Sensor  (FREESTYLE LIBRE 14 DAY SENSOR) MISC Inject 1 Device into the skin every 14 (fourteen) days.    [provider]  D-Mannose 500 MG CAPS Take 500 mg by mouth daily. Patient not taking: Reported on 11/16/2023    [provider]  diclofenac Sodium (VOLTAREN) 1 % GEL Apply 2 g topically 4 (four) times daily as needed (joint pain).    [provider]  docusate sodium (COLACE) 100 MG capsule Take 1 capsule (100 mg total) by mouth 2 (two) times daily. 12/02/23   Berton Mount I, MD  Glucagon HCl 1 MG SOLR Inject 1 mg into the skin as needed (for  a blood sugar <67).    [provider]  insulin degludec (TRESIBA) 200 UNIT/ML FlexTouch Pen Inject 10 Units into the skin See admin instructions. 50 units SQ once daily if BG is greater than 150 12/02/23   Barnetta Chapel, MD  levothyroxine (SYNTHROID) 88 MCG tablet Take 88 mcg by mouth daily before breakfast.    [provider]  nitroGLYCERIN (NITROSTAT) 0.4 MG SL tablet Place 1 tablet (0.4 mg total) under the tongue every 5 (five) minutes as needed for chest pain. 01/20/21 10/07/24  Cantwell, Celeste C, PA-C  NOVOLOG FLEXPEN 100 UNIT/ML FlexPen Inject 5-10 Units into the skin See admin instructions. Inject 5 units into the skin before meals if BGL is 100-150, 7 units if BGL is 151-200, and 10 units if BGL is greater than 200 05/21/22   [provider]  polyethylene glycol (MIRALAX / GLYCOLAX) 17 g packet Take 17 g by mouth daily as needed for mild constipation. 12/02/23   Berton Mount I, MD  sertraline (ZOLOFT) 50 MG tablet Take 50 mg by mouth at bedtime.    [provider]    Physical Exam: Vitals:   02/03/24 2200 02/03/24 2230 02/03/24 2300 02/03/24 2330  BP: (!) 197/87 (!) 184/76 (!) 192/71 (!) 160/55  Pulse: 79 81 79 81  Resp: 20   19  Temp:      TempSrc:      SpO2: 100% 100% 96% 97%   Constitutional: Acutely ill looking, NAD, calm, comfortable Eyes: PERRL, lids and conjunctivae  normal ENMT: Mucous membranes are moist. Posterior pharynx clear of any exudate or lesions.Normal dentition.  Neck: normal, supple, no masses, no thyromegaly Respiratory: Coarse breath sounds bilaterally, no wheezing, no crackles. Normal respiratory effort. No accessory muscle use.  Cardiovascular: Regular rate and rhythm, no murmurs / rubs / gallops. No extremity edema. 2+ pedal pulses. No carotid bruits.  Abdomen: no tenderness, no masses palpated. No hepatosplenomegaly. Bowel sounds positive.  Musculoskeletal: Good range of motion, no joint swelling or tenderness, Skin: no rashes, lesions, ulcers. No induration Neurologic: CN 2-12 grossly intact. Sensation intact, DTR normal. Strength 5/5 in all 4.  Psychiatric: Normal judgment and insight. Alert and oriented x 3. Normal mood  Data Reviewed:  Temperature 98.5, blood pressure 217/87, pulse 81 respiration 19.  Hemoglobin is 10.7 BUN 37 creatinine 2.40 calcium 8.4.  Assessment and Plan: No notes have been filed under this hospital service. Service: Hospitalist     Advance Care Planning:   Code Status: Full Code ***  Consults: ***  Family Communication: ***  Severity of Illness: {Observation/Inpatient:21159}  AuthorLonia Blood, MD 02/03/2024 11:58 PM  For on call review [email or web address].

## 2024-02-03 NOTE — ED Notes (Signed)
 DO made aware of BP.

## 2024-02-03 NOTE — ED Notes (Signed)
 Pt given crushed PO meds in applesauce

## 2024-02-04 DIAGNOSIS — J101 Influenza due to other identified influenza virus with other respiratory manifestations: Secondary | ICD-10-CM | POA: Diagnosis not present

## 2024-02-04 LAB — CBC WITH DIFFERENTIAL/PLATELET
Abs Immature Granulocytes: 0.02 10*3/uL (ref 0.00–0.07)
Basophils Absolute: 0 10*3/uL (ref 0.0–0.1)
Basophils Relative: 1 %
Eosinophils Absolute: 0 10*3/uL (ref 0.0–0.5)
Eosinophils Relative: 0 %
HCT: 28.7 % — ABNORMAL LOW (ref 36.0–46.0)
Hemoglobin: 10 g/dL — ABNORMAL LOW (ref 12.0–15.0)
Immature Granulocytes: 0 %
Lymphocytes Relative: 8 %
Lymphs Abs: 0.5 10*3/uL — ABNORMAL LOW (ref 0.7–4.0)
MCH: 29.8 pg (ref 26.0–34.0)
MCHC: 34.8 g/dL (ref 30.0–36.0)
MCV: 85.4 fL (ref 80.0–100.0)
Monocytes Absolute: 0.5 10*3/uL (ref 0.1–1.0)
Monocytes Relative: 8 %
Neutro Abs: 4.9 10*3/uL (ref 1.7–7.7)
Neutrophils Relative %: 83 %
Platelets: 201 10*3/uL (ref 150–400)
RBC: 3.36 MIL/uL — ABNORMAL LOW (ref 3.87–5.11)
RDW: 13.1 % (ref 11.5–15.5)
WBC: 5.9 10*3/uL (ref 4.0–10.5)
nRBC: 0 % (ref 0.0–0.2)

## 2024-02-04 LAB — RENAL FUNCTION PANEL
Albumin: 3.1 g/dL — ABNORMAL LOW (ref 3.5–5.0)
Anion gap: 12 (ref 5–15)
BUN: 33 mg/dL — ABNORMAL HIGH (ref 8–23)
CO2: 20 mmol/L — ABNORMAL LOW (ref 22–32)
Calcium: 8.9 mg/dL (ref 8.9–10.3)
Chloride: 107 mmol/L (ref 98–111)
Creatinine, Ser: 2.34 mg/dL — ABNORMAL HIGH (ref 0.44–1.00)
GFR, Estimated: 20 mL/min — ABNORMAL LOW (ref >60)
Glucose, Bld: 189 mg/dL — ABNORMAL HIGH (ref 70–99)
Phosphorus: 3.2 mg/dL (ref 2.5–4.6)
Potassium: 3.8 mmol/L (ref 3.5–5.1)
Sodium: 139 mmol/L (ref 135–145)

## 2024-02-04 LAB — CBG MONITORING, ED: Glucose-Capillary: 136 mg/dL — ABNORMAL HIGH (ref 70–99)

## 2024-02-04 LAB — TROPONIN I (HIGH SENSITIVITY): Troponin I (High Sensitivity): 18 ng/L — ABNORMAL HIGH (ref <18)

## 2024-02-04 LAB — GLUCOSE, CAPILLARY
Glucose-Capillary: 185 mg/dL — ABNORMAL HIGH (ref 70–99)
Glucose-Capillary: 216 mg/dL — ABNORMAL HIGH (ref 70–99)
Glucose-Capillary: 223 mg/dL — ABNORMAL HIGH (ref 70–99)

## 2024-02-04 LAB — MAGNESIUM: Magnesium: 1.7 mg/dL (ref 1.7–2.4)

## 2024-02-04 MED ORDER — AMLODIPINE BESYLATE 5 MG PO TABS
5.0000 mg | ORAL_TABLET | Freq: Every day | ORAL | Status: DC
  Administered 2024-02-04: 5 mg via ORAL
  Filled 2024-02-04: qty 1

## 2024-02-04 MED ORDER — CARVEDILOL 6.25 MG PO TABS: 6.2500 mg | ORAL_TABLET | Freq: Two times a day (BID) | ORAL | Status: DC

## 2024-02-04 MED ORDER — ACETAMINOPHEN 650 MG RE SUPP
325.0000 mg | RECTAL | Status: DC | PRN
  Administered 2024-02-06: 325 mg via RECTAL
  Filled 2024-02-04 (×2): qty 1

## 2024-02-04 MED ORDER — ENOXAPARIN SODIUM 40 MG/0.4ML IJ SOSY: 40.0000 mg | PREFILLED_SYRINGE | INTRAMUSCULAR | Status: DC

## 2024-02-04 MED ORDER — CARVEDILOL 12.5 MG PO TABS
12.5000 mg | ORAL_TABLET | Freq: Two times a day (BID) | ORAL | Status: DC
  Administered 2024-02-05 – 2024-02-07 (×6): 12.5 mg via ORAL
  Filled 2024-02-04 (×6): qty 1

## 2024-02-04 MED ORDER — OSELTAMIVIR PHOSPHATE 30 MG PO CAPS
30.0000 mg | ORAL_CAPSULE | Freq: Two times a day (BID) | ORAL | Status: DC
  Administered 2024-02-04: 30 mg via ORAL
  Filled 2024-02-04 (×2): qty 1

## 2024-02-04 MED ORDER — ENSURE ENLIVE PO LIQD
237.0000 mL | Freq: Two times a day (BID) | ORAL | Status: DC
  Administered 2024-02-05 – 2024-02-07 (×4): 237 mL via ORAL

## 2024-02-04 MED ORDER — SODIUM CHLORIDE 0.9 % IV SOLN: INTRAVENOUS | Status: DC

## 2024-02-04 MED ORDER — AMLODIPINE BESYLATE 10 MG PO TABS
10.0000 mg | ORAL_TABLET | Freq: Every day | ORAL | Status: DC
Start: 2024-02-05 — End: 2024-02-07
  Administered 2024-02-05 – 2024-02-07 (×3): 10 mg via ORAL
  Filled 2024-02-04 (×3): qty 1

## 2024-02-04 MED ORDER — SODIUM CHLORIDE 0.9% FLUSH
3.0000 mL | Freq: Two times a day (BID) | INTRAVENOUS | Status: DC
  Administered 2024-02-04 – 2024-02-07 (×4): 3 mL via INTRAVENOUS

## 2024-02-04 MED ORDER — ARIPIPRAZOLE 2 MG PO TABS
2.0000 mg | ORAL_TABLET | Freq: Every day | ORAL | Status: DC
  Administered 2024-02-05 – 2024-02-07 (×3): 2 mg via ORAL
  Filled 2024-02-04 (×4): qty 1

## 2024-02-04 MED ORDER — NITROGLYCERIN 0.4 MG SL SUBL: 0.4000 mg | SUBLINGUAL_TABLET | SUBLINGUAL | Status: DC | PRN

## 2024-02-04 MED ORDER — SERTRALINE HCL 50 MG PO TABS
50.0000 mg | ORAL_TABLET | Freq: Every day | ORAL | Status: DC
  Administered 2024-02-04 – 2024-02-06 (×3): 50 mg via ORAL
  Filled 2024-02-04 (×4): qty 1

## 2024-02-04 MED ORDER — HYDRALAZINE HCL 20 MG/ML IJ SOLN: 5.0000 mg | INTRAMUSCULAR | Status: AC | PRN

## 2024-02-04 MED ORDER — ONDANSETRON HCL 4 MG PO TABS: 4.0000 mg | ORAL_TABLET | Freq: Four times a day (QID) | ORAL | Status: DC | PRN

## 2024-02-04 MED ORDER — LEVOTHYROXINE SODIUM 88 MCG PO TABS
88.0000 ug | ORAL_TABLET | Freq: Every day | ORAL | Status: DC
  Administered 2024-02-04 – 2024-02-07 (×4): 88 ug via ORAL
  Filled 2024-02-04 (×4): qty 1

## 2024-02-04 MED ORDER — LACTATED RINGERS IV SOLN: INTRAVENOUS | Status: AC

## 2024-02-04 MED ORDER — OSELTAMIVIR PHOSPHATE 30 MG PO CAPS
30.0000 mg | ORAL_CAPSULE | Freq: Two times a day (BID) | ORAL | Status: DC
  Administered 2024-02-04 – 2024-02-05 (×3): 30 mg via ORAL
  Filled 2024-02-04 (×3): qty 1

## 2024-02-04 MED ORDER — ONDANSETRON HCL 4 MG/2ML IJ SOLN: 4.0000 mg | Freq: Four times a day (QID) | INTRAMUSCULAR | Status: DC | PRN

## 2024-02-04 MED ORDER — ATORVASTATIN CALCIUM 40 MG PO TABS
40.0000 mg | ORAL_TABLET | Freq: Every day | ORAL | Status: DC
  Administered 2024-02-05 – 2024-02-07 (×3): 40 mg via ORAL
  Filled 2024-02-04 (×4): qty 1

## 2024-02-04 MED ORDER — HEPARIN SODIUM (PORCINE) 5000 UNIT/ML IJ SOLN
5000.0000 [IU] | Freq: Two times a day (BID) | INTRAMUSCULAR | Status: DC
  Administered 2024-02-04 – 2024-02-07 (×6): 5000 [IU] via SUBCUTANEOUS
  Filled 2024-02-04 (×6): qty 1

## 2024-02-04 MED ORDER — INSULIN ASPART 100 UNIT/ML IJ SOLN
0.0000 [IU] | Freq: Three times a day (TID) | INTRAMUSCULAR | Status: DC
  Administered 2024-02-04: 1 [IU] via SUBCUTANEOUS
  Administered 2024-02-04: 2 [IU] via SUBCUTANEOUS
  Administered 2024-02-05 – 2024-02-07 (×4): 1 [IU] via SUBCUTANEOUS

## 2024-02-04 NOTE — Evaluation (Signed)
 Physical Therapy Evaluation Patient Details Name: Diana Harper MRN: [id number] DOB: 07-12-1936 Today's Date: 02/04/2024  History of Present Illness  is 88 yo [facility] due to recurrent syncope, hypertensive urgency, influenza A and acute on chronic kidney injury.  PMH: prior CVA, type 2 diabetes, essential hypertension, hyperlipidemia, hypothyroidism, recent C. difficile colitis, dementia, CKD III, hx of vasovagal syncope  Clinical Impression  Pt previously has intermittent good and bad days per daughter. On good days pt is able to assist with bed mobility, sit to stand and gait with physical assist and RW. On bad days pt is unable to assist with any functional activity. Currently pt was unable to assist with any functional physical activity and was total assist for bed mobility and requires Min-Max a to sit EOB. Pt was previously receiving HHPT at home for mobility with some improvement. Due to pt current functional status, home set up and available assistance at home recommending skilled physical therapy services 3x/week in order to address strength, balance and functional mobility to decrease risk for falls, injury and re-hospitalization.           If plan is discharge home, recommend the following: Two people to help with walking and/or transfers;Supervision due to cognitive status;Help with stairs or ramp for entrance;Assistance with cooking/housework     Equipment Recommendations None recommended by PT     Functional Status Assessment Patient has had a recent decline in their functional status and/or demonstrates limited ability to make significant improvements in function in a reasonable and predictable amount of time     Precautions / Restrictions Precautions Precautions: Fall Recall of Precautions/Restrictions: Impaired Restrictions Weight Bearing Restrictions Per Provider Order: No      Mobility  Bed Mobility Overal bed mobility: Needs Assistance Bed Mobility:  Sidelying to Sit, Sit to Sidelying   Sidelying to sit: +2 for physical assistance, Total assist     Sit to sidelying: Total assist, +2 for physical assistance General bed mobility comments: Pt did not assist. Once in sitting pt was Min A to Max A to sit EOB. R lean.      Balance Overall balance assessment: Needs assistance Sitting-balance support: No upper extremity supported, Feet unsupported, Feet supported Sitting balance-Leahy Scale: Poor Sitting balance - Comments: Requires physical assist to remain sitting EOB Postural control: Right lateral lean, Posterior lean         Pertinent Vitals/Pain Pain Assessment Pain Assessment: Faces Faces Pain Scale: No hurt Pain Intervention(s): Monitored during session    Home Living Family/patient expects to be discharged to:: Private residence Living Arrangements: Children (lives with daughter) Available Help at Discharge: Available 24 hours/day Type of Home: House Home Access: Stairs to enter Entrance Stairs-Rails: Right Entrance Stairs-Number of Steps: 4-5   Home Layout: One level Home Equipment: Agricultural consultant (2 wheels);BSC/3in1;Grab bars - toilet;Shower seat;Cane - single point;Tub bench;Wheelchair - Careers adviser (comment) Additional Comments: daughter confirmed information    Prior Function Prior Level of Function : Needs assist             Mobility Comments: transfers to w/c, daughter reports not ambulating much at all ADLs Comments: pt gets assist with ADL"s from daughter     Extremity/Trunk Assessment   Upper Extremity Assessment Upper Extremity Assessment: Defer to OT evaluation    Lower Extremity Assessment Lower Extremity Assessment: Generalized weakness;RLE deficits/detail;LLE deficits/detail RLE Deficits / Details: pt was very stiff in hip/knee flexion; no intentional movement noted. Pt was not following directions LLE Deficits / Details:  pt was very stiff in hip/knee flexion; no intentional movement  noted. Pt was not following directions    Cervical / Trunk Assessment Cervical / Trunk Assessment: Kyphotic  Communication   Communication Communication: Impaired Factors Affecting Communication: Other (comment) (dysarthria)    Cognition Arousal: Lethargic, Stuporous Behavior During Therapy: Flat affect   PT - Cognitive impairments: History of cognitive impairments       Following commands: Impaired Following commands impaired: Follows one step commands inconsistently     Cueing Cueing Techniques: Verbal cues, Tactile cues     General Comments General comments (skin integrity, edema, etc.): Daughter present throughout session and states some days pt moves better and other days worse. She states they call it a "stupor" when she does not follow directions and state pt is currently in a "stupor"        Assessment/Plan    PT Assessment Patient needs continued PT services  PT Problem List Decreased strength;Decreased balance;Decreased mobility       PT Treatment Interventions DME instruction;Therapeutic activities;Gait training;Therapeutic exercise;Balance training;Functional mobility training;Patient/family education;Neuromuscular re-education;Wheelchair mobility training    PT Goals (Current goals can be found in the Care Plan section)  Acute Rehab PT Goals Patient Stated Goal: return home with HHPT PT Goal Formulation: With family Time For Goal Achievement: 02/18/24 Potential to Achieve Goals: Good    Frequency Min 1X/week     Co-evaluation PT/OT/SLP Co-Evaluation/Treatment: Yes Reason for Co-Treatment: Complexity of the patient's impairments (multi-system involvement);Necessary to address cognition/behavior during functional activity;For patient/therapist safety;To address functional/ADL transfers PT goals addressed during session: Mobility/safety with mobility;Balance         AM-PAC PT "6 Clicks" Mobility  Outcome Measure Help needed turning from your back  to your side while in a flat bed without using bedrails?: Total Help needed moving from lying on your back to sitting on the side of a flat bed without using bedrails?: Total Help needed moving to and from a bed to a chair (including a wheelchair)?: Total Help needed standing up from a chair using your arms (e.g., wheelchair or bedside chair)?: Total Help needed to walk in hospital room?: Total Help needed climbing 3-5 steps with a railing? : Total 6 Click Score: 6    End of Session   Activity Tolerance: Patient limited by lethargy Patient left: in bed;with bed alarm set;with call bell/phone within reach;with family/visitor present Nurse Communication: Mobility status;Need for lift equipment PT Visit Diagnosis: Other abnormalities of gait and mobility (R26.89)    Time: 0981-1914 PT Time Calculation (min) (ACUTE ONLY): 20 min   Charges:   PT Evaluation $PT Eval Low Complexity: 1 Low   PT General Charges $$ ACUTE PT VISIT: 1 Visit        Harrel Carina, DPT, CLT  Acute Rehabilitation Services Office: [phone] (Secure chat preferred)   Claudia Desanctis 02/04/2024, 4:09 PM

## 2024-02-04 NOTE — Progress Notes (Signed)
 New Admission Note:   Arrival Method: carelink Mental Orientation: nonverbal and confused Telemetry: none Assessment: Completed Skin: see flowsheet IV: NSL Pain: none Tubes: none Safety Measures: Safety Fall Prevention Plan has been discussed Admission: Completed [facility] Orientation: Patient has been orientated to the room, unit and staff.  Family: none at bedside  Orders have been reviewed and implemented. Will continue to monitor the patient. Call light has been placed within reach and bed alarm has been activated.   Artemio Aly BSN, RN Phone number: [phone]

## 2024-02-04 NOTE — Plan of Care (Signed)
   Problem: Education: Goal: Ability to describe self-care measures that may prevent or decrease complications (Diabetes Survival Skills Education) will improve Outcome: Completed/Met

## 2024-02-04 NOTE — ED Notes (Signed)
 CBG checked for carelink

## 2024-02-04 NOTE — ED Notes (Signed)
 Report called to receiving RN at [facility].

## 2024-02-04 NOTE — Progress Notes (Signed)
 PROGRESS NOTE    Diana Harper  ZOX:[id number] DOB: 04/03/1936 DOA: 02/03/2024 PCP: Lewis Moccasin, MD  Outpatient Specialists:     Brief Narrative:  Patient is an 88 year old female, past medical history significant for type 2 diabetes mellitus, chronic kidney disease stage IIIb/IV, with baseline serum creatinine of 1.68; hypertension, recurrent vasovagal syncope, diverticulosis, asthma, hyperlipidemia, and history of CVA with intracranial bleed.  Patient is said to be aphasic.  Patient was admitted with recurrent syncope, hypertensive urgency, influenza A and acute kidney injury on chronic kidney disease stage IIIb/IV.  02/04/2024: Patient seen.  Also discussed with patient's nurse.  No history from patient.  Blood pressure is down to 177/88 mmHg (from 217/87 mmHg).  Serum creatinine on presentation was 2.4.  No repeat renal panel visualized.  Will restart patient's Coreg 6.25 Mg p.o. twice daily.  Will add amlodipine.  Will place patient on telemetry.  Will also consult palliative care team to assist with CODE STATUS and goal of care.  Patient is currently full code.  Patient looks volume depleted.   Assessment & Plan:   Principal Problem:   Influenza A Active Problems:   Syncope, vasovagal   History of CVA (cerebrovascular accident)   Acute kidney injury (HCC)   Dehydration   Acute renal failure superimposed on stage 3a chronic kidney disease (HCC)   CKD stage 3 due to type 2 diabetes mellitus (HCC)   Essential hypertension   Hypothyroidism   Uncontrolled type 2 diabetes mellitus with hyperglycemia, without long-term current use of insulin (HCC)   Hypertensive urgency   Dementia without behavioral disturbance (HCC)   Recurrent syncope:  -Patient has been having vasovagal syncope.  -Place patient on telemetry. -Optimize blood pressure -Manage influenza A. -Patient looks volume depleted.  Check urinalysis and UA. -IV fluid normal saline 75 cc/h x 24 hours. -Palliative  care consult. -Awaiting PT evaluation.     Hypertensive urgency:  -Resume Coreg 6.25 Mg p.o. twice daily. -Add amlodipine 5 Mg p.o. once daily. -Gradually optimize blood pressure. -Systolic blood pressures improved from 270 mmHg to 177 mmHg.     History of CVA: -Stable. -Continue to monitor.     Uncontrolled type 2 diabetes:  Initiate sliding scale insulin. Blood sugars currently controlled. -Calorie count.   Influenza A infection:  -Supportive care -Tamiflu 30 Mg p.o. twice daily for 5 days.     Acute kidney injury on chronic kidney disease stage IIIb/IV: -Likely multifactorial. -Possible secondary to hemodynamic instability. -Cannot rule out role of volume depletion. -Check urinalysis and urine sodium. -Cautious hydration. -Gradually controlled blood pressure. -Monitor renal function and electrolytes. -Dose all medications assuming estimated GFR of less than 15 mL/min per 1.73 m. -Avoid nephrotoxins. -Keep MAP greater than 65 mmHg.     Hypothyroidism:  -Continue levothyroxine   Dementia:  -No behavioral problems.     Normocytic anemia:  -Iron deficiency/chronic inflammation -Folate deficiency.   -Repeat folate level and vitamin B12 level in the morning.   DVT prophylaxis: Start subcutaneous heparin. Code Status: Full code. Family Communication: Daughter, Ahmya Bernick. Disposition Plan: Patient remains inpatient.  Consult palliative care team.  PT OT.   Consultants:  None.  Procedures:  None.  Antimicrobials:  Tamiflu.   Subjective: No history from patient.  Objective: Vitals:   02/04/24 0200 02/04/24 0448 02/04/24 0533 02/04/24 0900  BP: (!) 166/94 (!) 184/79 (!) 177/88 (!) (P) 172/90  Pulse: 85 97 91 (P) 95  Resp: 19 18 18  (P) 18  Temp:  (!) 101.1  F (38.4 C) (!) 101.3 F (38.5 C) (P) 99 F (37.2 C)  TempSrc:  Axillary Oral (P) Oral  SpO2: 99% 96% 98% (P) 100%    Intake/Output Summary (Last 24 hours) at 02/04/2024 1024 Last data filed  at 02/04/2024 1610 Gross per 24 hour  Intake --  Output 0 ml  Net 0 ml   There were no vitals filed for this visit.  Examination:  General exam: Appears calm and comfortable.  Chronically ill looking. Respiratory system: Clear to auscultation.  Cardiovascular system: S1 & S2 heard, systolic murmur.   Gastrointestinal system: Abdomen is non-distended Central nervous system: Awake and alert.  Query aphasia versus dysphasia.   Extremities: No leg edema.  Data Reviewed: I have personally reviewed following labs and imaging studies  CBC: Recent Labs  Lab 02/03/24 1918  WBC 8.5  NEUTROABS 6.4  HGB 10.7*  HCT 31.9*  MCV 88.1  PLT 215   Basic Metabolic Panel: Recent Labs  Lab 02/03/24 1918  NA 139  K 3.6  CL 110  CO2 23  GLUCOSE 108*  BUN 37*  CREATININE 2.40*  CALCIUM 8.4*   GFR: CrCl cannot be calculated (Unknown ideal weight.). Liver Function Tests: No results for input(s): "AST", "ALT", "ALKPHOS", "BILITOT", "PROT", "ALBUMIN" in the last 168 hours. No results for input(s): "LIPASE", "AMYLASE" in the last 168 hours. No results for input(s): "AMMONIA" in the last 168 hours. Coagulation Profile: No results for input(s): "INR", "PROTIME" in the last 168 hours. Cardiac Enzymes: No results for input(s): "CKTOTAL", "CKMB", "CKMBINDEX", "TROPONINI" in the last 168 hours. BNP (last 3 results) No results for input(s): "PROBNP" in the last 8760 hours. HbA1C: No results for input(s): "HGBA1C" in the last 72 hours. CBG: Recent Labs  Lab 02/04/24 0506  GLUCAP 136*   Lipid Profile: No results for input(s): "CHOL", "HDL", "LDLCALC", "TRIG", "CHOLHDL", "LDLDIRECT" in the last 72 hours. Thyroid Function Tests: No results for input(s): "TSH", "T4TOTAL", "FREET4", "T3FREE", "THYROIDAB" in the last 72 hours. Anemia Panel: No results for input(s): "VITAMINB12", "FOLATE", "FERRITIN", "TIBC", "IRON", "RETICCTPCT" in the last 72 hours. Urine analysis:    Component Value  Date/Time   COLORURINE YELLOW 12/01/2023 0029   APPEARANCEUR CLEAR 12/01/2023 0029   LABSPEC 1.013 12/01/2023 0029   PHURINE 6.0 12/01/2023 0029   GLUCOSEU NEGATIVE 12/01/2023 0029   HGBUR NEGATIVE 12/01/2023 0029   BILIRUBINUR NEGATIVE 12/01/2023 0029   KETONESUR NEGATIVE 12/01/2023 0029   PROTEINUR >=300 (A) 12/01/2023 0029   UROBILINOGEN 0.2 04/15/2012 1217   NITRITE POSITIVE (A) 12/01/2023 0029   LEUKOCYTESUR TRACE (A) 12/01/2023 0029   Sepsis Labs: @LABRCNTIP (procalcitonin:4,lacticidven:4)  ) Recent Results (from the past 240 hours)  Resp panel by RT-PCR (RSV, Flu A&B, Covid) Anterior Nasal Swab     Status: Abnormal   Collection Time: 02/03/24  7:18 PM   Specimen: Anterior Nasal Swab  Result Value Ref Range Status   SARS Coronavirus 2 by RT PCR NEGATIVE NEGATIVE Final    Comment: (NOTE) SARS-CoV-2 target nucleic acids are NOT DETECTED.  The SARS-CoV-2 RNA is generally detectable in upper respiratory specimens during the acute phase of infection. The lowest concentration of SARS-CoV-2 viral copies this assay can detect is 138 copies/mL. A negative result does not preclude SARS-Cov-2 infection and should not be used as the sole basis for treatment or other patient management decisions. A negative result may occur with  improper specimen collection/handling, submission of specimen other than nasopharyngeal swab, presence of viral mutation(s) within the areas targeted by this  assay, and inadequate number of viral copies(<138 copies/mL). A negative result must be combined with clinical observations, patient history, and epidemiological information. The expected result is Negative.  Fact Sheet for Patients:  BloggerCourse.com  Fact Sheet for Healthcare Providers:  SeriousBroker.it  This test is no t yet approved or cleared by the [place] FDA and  has been authorized for detection and/or diagnosis of SARS-CoV-2  by FDA under an Emergency Use Authorization (EUA). This EUA will remain  in effect (meaning this test can be used) for the duration of the COVID-19 declaration under Section 564(b)(1) of the Act, 21 U.S.C.section 360bbb-3(b)(1), unless the authorization is terminated  or revoked sooner.       Influenza A by PCR POSITIVE (A) NEGATIVE Final   Influenza B by PCR NEGATIVE NEGATIVE Final    Comment: (NOTE) The Xpert Xpress SARS-CoV-2/FLU/RSV plus assay is intended as an aid in the diagnosis of influenza from Nasopharyngeal swab specimens and should not be used as a sole basis for treatment. Nasal washings and aspirates are unacceptable for Xpert Xpress SARS-CoV-2/FLU/RSV testing.  Fact Sheet for Patients: BloggerCourse.com  Fact Sheet for Healthcare Providers: SeriousBroker.it  This test is not yet approved or cleared by the [place] FDA and has been authorized for detection and/or diagnosis of SARS-CoV-2 by FDA under an Emergency Use Authorization (EUA). This EUA will remain in effect (meaning this test can be used) for the duration of the COVID-19 declaration under Section 564(b)(1) of the Act, 21 U.S.C. section 360bbb-3(b)(1), unless the authorization is terminated or revoked.     Resp Syncytial Virus by PCR NEGATIVE NEGATIVE Final    Comment: (NOTE) Fact Sheet for Patients: BloggerCourse.com  Fact Sheet for Healthcare Providers: SeriousBroker.it  This test is not yet approved or cleared by the [place] FDA and has been authorized for detection and/or diagnosis of SARS-CoV-2 by FDA under an Emergency Use Authorization (EUA). This EUA will remain in effect (meaning this test can be used) for the duration of the COVID-19 declaration under Section 564(b)(1) of the Act, 21 U.S.C. section 360bbb-3(b)(1), unless the authorization is terminated or revoked.  Performed at  [facility], [address] [place], [place] [address]          Radiology Studies: [facility] Chest Port 1 View Result Date: 02/04/2024 CLINICAL DATA:  Cough EXAM: PORTABLE CHEST 1 VIEW COMPARISON:  11/30/2023 FINDINGS: Low lung volumes. Mild cardiomegaly. No acute airspace disease, pleural effusion or pneumothorax IMPRESSION: No active disease.  Low lung volumes with mild cardiomegaly Electronically Signed   By: Jasmine Pang M.D.   On: 02/04/2024 00:43        Scheduled Meds: Continuous Infusions:   LOS: 1 day    Time spent: 55 minutes.    Berton Mount, MD  [facility] Pager #: [phone] 7PM-7AM contact night coverage as above

## 2024-02-04 NOTE — ED Notes (Signed)
 [facility] contacted for transport

## 2024-02-04 NOTE — Evaluation (Signed)
 Occupational Therapy Evaluation Patient Details Name: Diana Harper MRN: [id number] DOB: [birth year] Today's Date: 02/04/2024   History of Present Illness   Pt is 88 yo presenting to [facility] on 3/7 due to recurrent syncope, hypertensive urgency, influenza A and acute on chronic kidney injury.  PMH: prior CVA, type 2 diabetes, essential hypertension, hyperlipidemia, hypothyroidism, recent C. difficile colitis, dementia, CKD III, hx of vasovagal syncope     Clinical Impressions Per daughter, pt has assist at baseline for all ADLs, can feed herself at times, and uses w/c at this time for mobility. Pt lives with daughter who is her main caregiver. Pt currently max-total A for ADLs and total A +2 for bed mobility, pt with no command follow and noted to have R lateral lean once sitting EOB. Per daughter pt with residual L deficits from prior CVA. Pt presenting with impairments listed below, will follow acutely. Recommend HHOT at d/c.      If plan is discharge home, recommend the following:   Two people to help with walking and/or transfers;A lot of help with bathing/dressing/bathroom;Assistance with cooking/housework;Direct supervision/assist for medications management;Direct supervision/assist for financial management;Assistance with feeding;Assist for transportation;Help with stairs or ramp for entrance     Functional Status Assessment   Patient has had a recent decline in their functional status and demonstrates the ability to make significant improvements in function in a reasonable and predictable amount of time.     Equipment Recommendations   None recommended by OT (daughter reports they have all needed DME)     Recommendations for Other Services   PT consult     Precautions/Restrictions   Precautions Precautions: Fall Recall of Precautions/Restrictions: Impaired Restrictions Weight Bearing Restrictions Per Provider Order: No     Mobility Bed Mobility Overal bed  mobility: Needs Assistance Bed Mobility: Sit to Supine, Supine to Sit     Supine to sit: Total assist, +2 for physical assistance Sit to supine: Total assist, +2 for physical assistance   General bed mobility comments: for EOB x48min    Transfers                   General transfer comment: deferred      Balance Overall balance assessment: Needs assistance Sitting-balance support: Feet supported Sitting balance-Leahy Scale: Poor   Postural control: Right lateral lean                                 ADL either performed or assessed with clinical judgement   ADL Overall ADL's : Needs assistance/impaired Eating/Feeding: Maximal assistance;Bed level   Grooming: Maximal assistance;Sitting   Upper Body Bathing: Total assistance   Lower Body Bathing: Total assistance   Upper Body Dressing : Total assistance   Lower Body Dressing: Total assistance   Toilet Transfer: Total assistance   Toileting- Clothing Manipulation and Hygiene: Total assistance       Functional mobility during ADLs: Total assistance       Vision   Additional Comments: will further assess     Perception Perception: Not tested       Praxis Praxis: Not tested       Pertinent Vitals/Pain       Extremity/Trunk Assessment Upper Extremity Assessment Upper Extremity Assessment: Defer to OT evaluation (rigidity vs resisting, difficult to assess, daughter reports L weakness from prior CVA)   Lower Extremity Assessment Lower Extremity Assessment: Generalized weakness;RLE deficits/detail;LLE deficits/detail RLE Deficits / Details: pt  was very stiff in hip/knee flexion; no intentional movement noted. Pt was not following directions LLE Deficits / Details: pt was very stiff in hip/knee flexion; no intentional movement noted. Pt was not following directions   Cervical / Trunk Assessment Cervical / Trunk Assessment: Kyphotic   Communication Communication Communication:  Impaired Factors Affecting Communication: Other (comment) (dysarthria)   Cognition     Cognition: Difficult to assess Difficult to assess due to: Level of arousal                                     Cueing  General Comments      Daughter present throughout session and states some days pt moves better and other days worse. She states they call it a "stupor" when she does not follow directions and state pt is currently in a "stupor"   Exercises     Shoulder Instructions      Home Living Family/patient expects to be discharged to:: Private residence Living Arrangements: Children (lives with daughter) Available Help at Discharge: Available 24 hours/day Type of Home: House Home Access: Stairs to enter Secretary/administrator of Steps: 4-5 Entrance Stairs-Rails: Right Home Layout: One level     Bathroom Shower/Tub: Chief Strategy Officer: Standard Bathroom Accessibility: Yes How Accessible: Accessible via walker Home Equipment: Rolling Walker (2 wheels);BSC/3in1;Grab bars - toilet;Shower seat;Cane - single point;Tub bench;Wheelchair - Careers adviser (comment)   Additional Comments: daughter confirmed information      Prior Functioning/Environment Prior Level of Function : Needs assist             Mobility Comments: transfers to w/c, daughter reports not ambulating much at all ADLs Comments: pt gets assist with ADL"s from daughter    OT Problem List: Decreased strength;Decreased range of motion;Decreased activity tolerance;Impaired balance (sitting and/or standing);Decreased cognition;Impaired UE functional use   OT Treatment/Interventions: Self-care/ADL training;Therapeutic exercise;Energy conservation;Neuromuscular education;DME and/or AE instruction;Therapeutic activities;Visual/perceptual remediation/compensation;Cognitive remediation/compensation;Patient/family education;Balance training      OT Goals(Current goals can be found in the  care plan section)   Acute Rehab OT Goals Patient Stated Goal: none stated OT Goal Formulation: Patient unable to participate in goal setting Time For Goal Achievement: 02/18/24 Potential to Achieve Goals: Good ADL Goals Pt Will Perform Grooming: with supervision;sitting Pt Will Perform Upper Body Dressing: with caregiver independent in assisting;sitting;bed level Pt Will Perform Lower Body Dressing: with caregiver independent in assisting;sitting/lateral leans;bed level;sit to/from stand Pt Will Transfer to Toilet: with min assist;squat pivot transfer;stand pivot transfer;bedside commode Additional ADL Goal #1: pt will follow 2 step command in prep for ADLs   OT Frequency:  Min 1X/week    Co-evaluation PT/OT/SLP Co-Evaluation/Treatment: Yes Reason for Co-Treatment: Complexity of the patient's impairments (multi-system involvement);Necessary to address cognition/behavior during functional activity;For patient/therapist safety;To address functional/ADL transfers PT goals addressed during session: Mobility/safety with mobility;Balance OT goals addressed during session: ADL's and self-care      AM-PAC OT "6 Clicks" Daily Activity     Outcome Measure Help from another person eating meals?: A Lot Help from another person taking care of personal grooming?: A Lot Help from another person toileting, which includes using toliet, bedpan, or urinal?: Total Help from another person bathing (including washing, rinsing, drying)?: Total Help from another person to put on and taking off regular upper body clothing?: Total Help from another person to put on and taking off regular lower body clothing?: Total 6 Click Score:  8   End of Session Nurse Communication: Mobility status  Activity Tolerance: Patient tolerated treatment well Patient left: in bed;with call bell/phone within reach;with bed alarm set;with family/visitor present  OT Visit Diagnosis: Unsteadiness on feet (R26.81);Other  abnormalities of gait and mobility (R26.89);Muscle weakness (generalized) (M62.81)                Time: 1610-9604 OT Time Calculation (min): 17 min Charges:  OT General Charges $OT Visit: 1 Visit OT Evaluation $OT Eval Low Complexity: 1 Low  Carver Fila, OTD, OTR/L SecureChat Preferred Acute Rehab [phone]   Carver Fila Koonce 02/04/2024, 4:13 PM

## 2024-02-04 NOTE — Progress Notes (Signed)
 Initial Nutrition Assessment  DOCUMENTATION CODES:   Not applicable  INTERVENTION:  Liberalize diet to regular given advanced age and nutrition risk Ensure Enlive po BID, each supplement provides 350 kcal and 20 grams of protein. Magic cup TID with meals, each supplement provides 290 kcal and 9 grams of protein Request updated measured weight  NUTRITION DIAGNOSIS:   Inadequate oral intake related to acute illness as evidenced by meal completion < 25%.  GOAL:   Patient will meet greater than or equal to 90% of their needs  MONITOR:   PO intake, Supplement acceptance, Diet advancement, Labs, Weight trends  REASON FOR ASSESSMENT:   Consult Calorie Count  ASSESSMENT:   Pt admitted with recurrent syncope, HTN urgency, influenza A and AK on CKD. PMH significant for T2DM, CKD stage IIIb/IV, HTN, recurrent vasovagal syncope, diverticulosis, HLD and history of CVA with intracranial bleed and aphasia, dementia.  PMT consulted.   RN unable to assess orientation status today. Reached out via secure chat as RD is working remotely. RN reports pt has not eaten during her shift today and has been drowsy. Review of meal ordering system reflects pt missed breakfast and lunch. Dinner has been ordered however RD adjusted to automatic trays in the event family is not present to ensure pt does not miss a meal.   Meal completions: 3/8: 0% breakfast  No updated weight on file. Pt's last documented weight was 58.9 kg on 12/01/23 which appears to have gradually declined within the last year.   Medications: SSI 0-6 units TID  Labs: BUN 33 Cr 2.34 GFR 20 CBG's 185, 136    NUTRITION - FOCUSED PHYSICAL EXAM: RD working remotely. Deferred to follow up.   Diet Order:   Diet Order             Diet regular Room service appropriate? No; Fluid consistency: Thin  Diet effective now                   EDUCATION NEEDS:   No education needs have been identified at this time  Skin:  Skin  Assessment: Reviewed RN Assessment  Last BM:  3/8  Height:   Ht Readings from Last 1 Encounters:  11/30/23 5\' 6"  (1.676 m)    Weight:   Wt Readings from Last 1 Encounters:  12/01/23 58.9 kg   BMI:  There is no height or weight on file to calculate BMI.  Estimated Nutritional Needs:   Kcal:  1400-1600  Protein:  70-80g  Fluid:  >/=1.5L  Drusilla Kanner, RDN, LDN Clinical Nutrition

## 2024-02-05 DIAGNOSIS — J101 Influenza due to other identified influenza virus with other respiratory manifestations: Secondary | ICD-10-CM | POA: Diagnosis not present

## 2024-02-05 LAB — RENAL FUNCTION PANEL
Albumin: 3 g/dL — ABNORMAL LOW (ref 3.5–5.0)
Anion gap: 5 (ref 5–15)
BUN: 37 mg/dL — ABNORMAL HIGH (ref 8–23)
CO2: 26 mmol/L (ref 22–32)
Calcium: 8.7 mg/dL — ABNORMAL LOW (ref 8.9–10.3)
Chloride: 107 mmol/L (ref 98–111)
Creatinine, Ser: 2.37 mg/dL — ABNORMAL HIGH (ref 0.44–1.00)
GFR, Estimated: 20 mL/min — ABNORMAL LOW (ref >60)
Glucose, Bld: 145 mg/dL — ABNORMAL HIGH (ref 70–99)
Phosphorus: 3 mg/dL (ref 2.5–4.6)
Potassium: 3.8 mmol/L (ref 3.5–5.1)
Sodium: 138 mmol/L (ref 135–145)

## 2024-02-05 LAB — CBC WITH DIFFERENTIAL/PLATELET
Abs Immature Granulocytes: 0.02 10*3/uL (ref 0.00–0.07)
Basophils Absolute: 0 10*3/uL (ref 0.0–0.1)
Basophils Relative: 1 %
Eosinophils Absolute: 0 10*3/uL (ref 0.0–0.5)
Eosinophils Relative: 0 %
HCT: 27.7 % — ABNORMAL LOW (ref 36.0–46.0)
Hemoglobin: 9.4 g/dL — ABNORMAL LOW (ref 12.0–15.0)
Immature Granulocytes: 1 %
Lymphocytes Relative: 16 %
Lymphs Abs: 0.7 10*3/uL (ref 0.7–4.0)
MCH: 29.4 pg (ref 26.0–34.0)
MCHC: 33.9 g/dL (ref 30.0–36.0)
MCV: 86.6 fL (ref 80.0–100.0)
Monocytes Absolute: 0.6 10*3/uL (ref 0.1–1.0)
Monocytes Relative: 14 %
Neutro Abs: 2.9 10*3/uL (ref 1.7–7.7)
Neutrophils Relative %: 68 %
Platelets: 169 10*3/uL (ref 150–400)
RBC: 3.2 MIL/uL — ABNORMAL LOW (ref 3.87–5.11)
RDW: 13.2 % (ref 11.5–15.5)
WBC: 4.2 10*3/uL (ref 4.0–10.5)
nRBC: 0 % (ref 0.0–0.2)

## 2024-02-05 LAB — URINALYSIS, ROUTINE W REFLEX MICROSCOPIC
Bilirubin Urine: NEGATIVE
Glucose, UA: NEGATIVE mg/dL
Ketones, ur: NEGATIVE mg/dL
Nitrite: NEGATIVE
Protein, ur: >=300 mg/dL — AB
Specific Gravity, Urine: 1.018 (ref 1.005–1.030)
pH: 5 (ref 5.0–8.0)

## 2024-02-05 LAB — GLUCOSE, CAPILLARY
Glucose-Capillary: 126 mg/dL — ABNORMAL HIGH (ref 70–99)
Glucose-Capillary: 169 mg/dL — ABNORMAL HIGH (ref 70–99)
Glucose-Capillary: 122 mg/dL — ABNORMAL HIGH (ref 70–99)
Glucose-Capillary: 137 mg/dL — ABNORMAL HIGH (ref 70–99)

## 2024-02-05 LAB — MAGNESIUM: Magnesium: 1.7 mg/dL (ref 1.7–2.4)

## 2024-02-05 LAB — SODIUM, URINE, RANDOM: Sodium, Ur: 73 mmol/L

## 2024-02-05 LAB — VITAMIN B12: Vitamin B-12: 466 pg/mL (ref 180–914)

## 2024-02-05 LAB — FOLATE: Folate: 4.9 ng/mL — ABNORMAL LOW (ref >5.9)

## 2024-02-05 MED ORDER — SODIUM CHLORIDE 0.9 % IV SOLN: INTRAVENOUS | Status: AC

## 2024-02-05 MED ORDER — ORAL CARE MOUTH RINSE: 15.0000 mL | OROMUCOSAL | Status: DC | PRN

## 2024-02-05 MED ORDER — OSELTAMIVIR PHOSPHATE 30 MG PO CAPS
30.0000 mg | ORAL_CAPSULE | Freq: Every day | ORAL | Status: DC
  Administered 2024-02-06 – 2024-02-07 (×2): 30 mg via ORAL
  Filled 2024-02-05 (×2): qty 1

## 2024-02-05 MED ORDER — ORAL CARE MOUTH RINSE
15.0000 mL | OROMUCOSAL | Status: DC
  Administered 2024-02-05 – 2024-02-07 (×10): 15 mL via OROMUCOSAL

## 2024-02-05 NOTE — Progress Notes (Signed)
 Bladder scanned patient. No urine noted in bladder. Will continue to monitor.

## 2024-02-05 NOTE — Plan of Care (Signed)
  Problem: Coping: Goal: Ability to adjust to condition or change in health will improve Outcome: Progressing   Problem: Fluid Volume: Goal: Ability to maintain a balanced intake and output will improve Outcome: Progressing   Problem: Nutritional: Goal: Maintenance of adequate nutrition will improve Outcome: Progressing   Problem: Skin Integrity: Goal: Risk for impaired skin integrity will decrease Outcome: Progressing   Problem: Tissue Perfusion: Goal: Adequacy of tissue perfusion will improve Outcome: Progressing   Problem: Cardiac: Goal: Will achieve and/or maintain adequate cardiac output Outcome: Progressing   Problem: Physical Regulation: Goal: Complications related to the disease process, condition or treatment will be avoided or minimized Outcome: Progressing

## 2024-02-05 NOTE — Progress Notes (Signed)
 PHARMACY NOTE:  ANTIMICROBIAL RENAL DOSAGE ADJUSTMENT  Current antimicrobial regimen includes a mismatch between antimicrobial dosage and estimated renal function.  As per policy approved by the Pharmacy & Therapeutics and Medical Executive Committees, the antimicrobial dosage will be adjusted accordingly.  Current antimicrobial dosage:  Oseltamivir 30mg  BID  Indication: Flu  Renal Function: CrCl ~ 19 based on SCr 2.4  CrCl cannot be calculated (Unknown ideal weight.). []      On intermittent HD, scheduled: []      On CRRT    Antimicrobial dosage has been changed to:  Oseltamivir 30mg  q24h  Additional comments:   Thank you for allowing pharmacy to be a part of this patient's care.  Brandon Melnick, [facility] 02/05/2024 9:54 PM

## 2024-02-05 NOTE — Progress Notes (Signed)
 PROGRESS NOTE    Diana Harper  ZOX:[id number] DOB: 26-Jul-1936 DOA: 02/03/2024 PCP: Lewis Moccasin, MD  Outpatient Specialists:     Brief Narrative:  Patient is an 88 year old female, past medical history significant for type 2 diabetes mellitus, chronic kidney disease stage IIIb/IV, with baseline serum creatinine of 1.68; hypertension, recurrent vasovagal syncope, diverticulosis, asthma, hyperlipidemia, and history of CVA with intracranial bleed.  Patient is said to be aphasic.  Patient was admitted with recurrent syncope, hypertensive urgency, influenza A and acute kidney injury on chronic kidney disease stage IIIb/IV.  02/04/2024: Patient seen.  Also discussed with patient's nurse.  No history from patient.  Blood pressure is down to 177/88 mmHg (from 217/87 mmHg).  Serum creatinine on presentation was 2.4.  No repeat renal panel visualized.  Will restart patient's Coreg 6.25 Mg p.o. twice daily.  Will add amlodipine.  Will place patient on telemetry.  Will also consult palliative care team to assist with CODE STATUS and goal of care.  Patient is currently full code.  Patient looks volume depleted.  02/05/2024: Patient seen.  Patient looks better today.  Patient is more communicative.  Mild swelling of right lower arm noted.  Will proceed with venous Doppler ultrasound of the extremities.  Continue IV fluids.  Urinalysis and urine sodium.  PT/OT consult.   Assessment & Plan:   Principal Problem:   Influenza A Active Problems:   Syncope, vasovagal   History of CVA (cerebrovascular accident)   Acute kidney injury (HCC)   Dehydration   Acute renal failure superimposed on stage 3a chronic kidney disease (HCC)   CKD stage 3 due to type 2 diabetes mellitus (HCC)   Essential hypertension   Hypothyroidism   Uncontrolled type 2 diabetes mellitus with hyperglycemia, without long-term current use of insulin (HCC)   Hypertensive urgency   Dementia without behavioral disturbance  (HCC)   Recurrent syncope:  -Patient has history of recurrent vasovagal syncope.  -Current syncope occurred when patient was using the bathroom -Patient was also volume depleted. -Continue  IV fluid -Continue telemetry monitoring.   -Optimize blood pressure -Manage influenza A. -PT/OT consult -Palliative care consult.   Hypertensive urgency:  -Blood pressure control is slowly improving. -Continue Coreg and amlodipine.  . -Gradually optimize blood pressure.  Volume depletion: -Continue IV fluids. -Check urine sodium and urinalysis.   History of CVA: -Stable. -Continue to monitor.     Uncontrolled type 2 diabetes:  Initiate sliding scale insulin. Blood sugars currently controlled. -Calorie count.   Influenza A infection:  -Supportive care -Tamiflu 30 Mg p.o. twice daily for 5 days.     Acute kidney injury on chronic kidney disease stage IIIb/IV: -Likely multifactorial. -Likely secondary to hemodynamic instability and volume depletion.   -Check urinalysis and urine sodium. -Continue IV fluid -Gradually controlled blood pressure. -Monitor renal function and electrolytes. -Dose all medications assuming estimated GFR of less than 15 mL/min per 1.73 m. -Avoid nephrotoxins. -Keep MAP greater than 65 mmHg.     Hypothyroidism:  -Continue levothyroxine   Dementia:  -No behavioral problems.     Normocytic anemia:  -Iron deficiency/chronic inflammation -Folate deficiency.   -Repeat folate level and vitamin B12 level in the morning.  Right upper extremity swelling: -Possibly due to IV infiltration. -Doppler ultrasound of the upper extremities.   DVT prophylaxis: Start subcutaneous heparin. Code Status: Full code. Family Communication: Daughter, Anevay Campanella. Disposition Plan: Patient remains inpatient.  Consult palliative care team.  PT OT.   Consultants:  None.  Procedures:  None.  Antimicrobials:  Tamiflu.   Subjective: Patient is more  communicative today.  Objective: Vitals:   02/04/24 1645 02/04/24 2027 02/05/24 0542 02/05/24 0938  BP: (!) 152/74 138/69 (!) 185/75 (!) 155/60  Pulse: 74 79 82 84  Resp: 18 18 18    Temp: 99 F (37.2 C) (!) 101.4 F (38.6 C) 98.9 F (37.2 C) 99.4 F (37.4 C)  TempSrc: Oral  Oral Oral  SpO2: 97% 100% 99% 99%    Intake/Output Summary (Last 24 hours) at 02/05/2024 1038 Last data filed at 02/05/2024 0900 Gross per 24 hour  Intake 2482.97 ml  Output 25 ml  Net 2457.97 ml   There were no vitals filed for this visit.  Examination:  General exam: Appears calm and comfortable.  Looks much better today.[name] Respiratory system: Clear to auscultation.  Cardiovascular system: S1 & S2 heard, systolic murmur.   Gastrointestinal system: Abdomen is non-distended Central nervous system: Awake and alert.  Patient is communicative. Extremities: No leg edema.  Swelling of right upper extremity.  Data Reviewed: I have personally reviewed following labs and imaging studies  CBC: Recent Labs  Lab 02/03/24 1918 02/04/24 1055 02/05/24 0536  WBC 8.5 5.9 4.2  NEUTROABS 6.4 4.9 2.9  HGB 10.7* 10.0* 9.4*  HCT 31.9* 28.7* 27.7*  MCV 88.1 85.4 86.6  PLT 215 201 169   Basic Metabolic Panel: Recent Labs  Lab 02/03/24 1918 02/04/24 1055 02/05/24 0536  NA 139 139 138  K 3.6 3.8 3.8  CL 110 107 107  CO2 23 20* 26  GLUCOSE 108* 189* 145*  BUN 37* 33* 37*  CREATININE 2.40* 2.34* 2.37*  CALCIUM 8.4* 8.9 8.7*  MG  --  1.7 1.7  PHOS  --  3.2 3.0   GFR: CrCl cannot be calculated (Unknown ideal weight.). Liver Function Tests: Recent Labs  Lab 02/04/24 1055 02/05/24 0536  ALBUMIN 3.1* 3.0*   No results for input(s): "LIPASE", "AMYLASE" in the last 168 hours. No results for input(s): "AMMONIA" in the last 168 hours. Coagulation Profile: No results for input(s): "INR", "PROTIME" in the last 168 hours. Cardiac Enzymes: No results for input(s): "CKTOTAL", "CKMB", "CKMBINDEX", "TROPONINI" in  the last 168 hours. BNP (last 3 results) No results for input(s): "PROBNP" in the last 8760 hours. HbA1C: No results for input(s): "HGBA1C" in the last 72 hours. CBG: Recent Labs  Lab 02/04/24 0506 02/04/24 1227 02/04/24 1642 02/04/24 2026 02/05/24 0724  GLUCAP 136* 185* 216* 223* 126*   Lipid Profile: No results for input(s): "CHOL", "HDL", "LDLCALC", "TRIG", "CHOLHDL", "LDLDIRECT" in the last 72 hours. Thyroid Function Tests: No results for input(s): "TSH", "T4TOTAL", "FREET4", "T3FREE", "THYROIDAB" in the last 72 hours. Anemia Panel: Recent Labs    02/05/24 0536  VITAMINB12 466  FOLATE 4.9*   Urine analysis:    Component Value Date/Time   COLORURINE YELLOW 12/01/2023 0029   APPEARANCEUR CLEAR 12/01/2023 0029   LABSPEC 1.013 12/01/2023 0029   PHURINE 6.0 12/01/2023 0029   GLUCOSEU NEGATIVE 12/01/2023 0029   HGBUR NEGATIVE 12/01/2023 0029   BILIRUBINUR NEGATIVE 12/01/2023 0029   KETONESUR NEGATIVE 12/01/2023 0029   PROTEINUR >=300 (A) 12/01/2023 0029   UROBILINOGEN 0.2 04/15/2012 1217   NITRITE POSITIVE (A) 12/01/2023 0029   LEUKOCYTESUR TRACE (A) 12/01/2023 0029   Sepsis Labs: @LABRCNTIP (procalcitonin:4,lacticidven:4)  ) Recent Results (from the past 240 hours)  Resp panel by RT-PCR (RSV, Flu A&B, Covid) Anterior Nasal Swab     Status: Abnormal   Collection Time: 02/03/24  7:18 PM   Specimen: Anterior Nasal Swab  Result Value Ref Range Status   SARS Coronavirus 2 by RT PCR NEGATIVE NEGATIVE Final    Comment: (NOTE) SARS-CoV-2 target nucleic acids are NOT DETECTED.  The SARS-CoV-2 RNA is generally detectable in upper respiratory specimens during the acute phase of infection. The lowest concentration of SARS-CoV-2 viral copies this assay can detect is 138 copies/mL. A negative result does not preclude SARS-Cov-2 infection and should not be used as the sole basis for treatment or other patient management decisions. A negative result may occur with  improper  specimen collection/handling, submission of specimen other than nasopharyngeal swab, presence of viral mutation(s) within the areas targeted by this assay, and inadequate number of viral copies(<138 copies/mL). A negative result must be combined with clinical observations, patient history, and epidemiological information. The expected result is Negative.  Fact Sheet for Patients:  BloggerCourse.com  Fact Sheet for Healthcare Providers:  SeriousBroker.it  This test is no t yet approved or cleared by the [place] FDA and  has been authorized for detection and/or diagnosis of SARS-CoV-2 by FDA under an Emergency Use Authorization (EUA). This EUA will remain  in effect (meaning this test can be used) for the duration of the COVID-19 declaration under Section 564(b)(1) of the Act, 21 U.S.C.section 360bbb-3(b)(1), unless the authorization is terminated  or revoked sooner.       Influenza A by PCR POSITIVE (A) NEGATIVE Final   Influenza B by PCR NEGATIVE NEGATIVE Final    Comment: (NOTE) The Xpert Xpress SARS-CoV-2/FLU/RSV plus assay is intended as an aid in the diagnosis of influenza from Nasopharyngeal swab specimens and should not be used as a sole basis for treatment. Nasal washings and aspirates are unacceptable for Xpert Xpress SARS-CoV-2/FLU/RSV testing.  Fact Sheet for Patients: BloggerCourse.com  Fact Sheet for Healthcare Providers: SeriousBroker.it  This test is not yet approved or cleared by the [place] FDA and has been authorized for detection and/or diagnosis of SARS-CoV-2 by FDA under an Emergency Use Authorization (EUA). This EUA will remain in effect (meaning this test can be used) for the duration of the COVID-19 declaration under Section 564(b)(1) of the Act, 21 U.S.C. section 360bbb-3(b)(1), unless the authorization is terminated or revoked.     Resp  Syncytial Virus by PCR NEGATIVE NEGATIVE Final    Comment: (NOTE) Fact Sheet for Patients: BloggerCourse.com  Fact Sheet for Healthcare Providers: SeriousBroker.it  This test is not yet approved or cleared by the [place] FDA and has been authorized for detection and/or diagnosis of SARS-CoV-2 by FDA under an Emergency Use Authorization (EUA). This EUA will remain in effect (meaning this test can be used) for the duration of the COVID-19 declaration under Section 564(b)(1) of the Act, 21 U.S.C. section 360bbb-3(b)(1), unless the authorization is terminated or revoked.  Performed at [facility], [address] [place], [place] [address]          Radiology Studies: [facility] Chest Port 1 View Result Date: 02/04/2024 CLINICAL DATA:  Cough EXAM: PORTABLE CHEST 1 VIEW COMPARISON:  11/30/2023 FINDINGS: Low lung volumes. Mild cardiomegaly. No acute airspace disease, pleural effusion or pneumothorax IMPRESSION: No active disease.  Low lung volumes with mild cardiomegaly Electronically Signed   By: Jasmine Pang M.D.   On: 02/04/2024 00:43        Scheduled Meds:  amLODipine  10 mg Oral Daily   ARIPiprazole  2 mg Oral Daily   atorvastatin  40 mg Oral Daily  carvedilol  12.5 mg Oral BID WC   feeding supplement  237 mL Oral BID BM   heparin injection (subcutaneous)  5,000 Units Subcutaneous Q12H   insulin aspart  0-6 Units Subcutaneous TID WC   levothyroxine  88 mcg Oral Q0600   mouth rinse  15 mL Mouth Rinse 4 times per day   oseltamivir  30 mg Oral BID   sertraline  50 mg Oral QHS   sodium chloride flush  3 mL Intravenous Q12H   Continuous Infusions:  sodium chloride     lactated ringers 125 mL/hr at 02/05/24 0603     LOS: 2 days    Time spent: 35 minutes.    Berton Mount, MD  [facility] Pager #: [phone] 7PM-7AM contact night coverage as above

## 2024-02-05 NOTE — Plan of Care (Signed)
  Problem: Education: Goal: Individualized Educational Video(s) Outcome: Completed/Met

## 2024-02-06 ENCOUNTER — Inpatient Hospital Stay (HOSPITAL_COMMUNITY)

## 2024-02-06 DIAGNOSIS — M7989 Other specified soft tissue disorders: Secondary | ICD-10-CM

## 2024-02-06 DIAGNOSIS — Z7189 Other specified counseling: Secondary | ICD-10-CM | POA: Diagnosis not present

## 2024-02-06 DIAGNOSIS — I159 Secondary hypertension, unspecified: Secondary | ICD-10-CM | POA: Diagnosis not present

## 2024-02-06 DIAGNOSIS — Z789 Other specified health status: Secondary | ICD-10-CM | POA: Diagnosis not present

## 2024-02-06 DIAGNOSIS — J101 Influenza due to other identified influenza virus with other respiratory manifestations: Secondary | ICD-10-CM | POA: Diagnosis not present

## 2024-02-06 DIAGNOSIS — Z711 Person with feared health complaint in whom no diagnosis is made: Secondary | ICD-10-CM | POA: Diagnosis not present

## 2024-02-06 DIAGNOSIS — Z515 Encounter for palliative care: Secondary | ICD-10-CM

## 2024-02-06 DIAGNOSIS — N179 Acute kidney failure, unspecified: Secondary | ICD-10-CM

## 2024-02-06 DIAGNOSIS — Z0389 Encounter for observation for other suspected diseases and conditions ruled out: Secondary | ICD-10-CM | POA: Diagnosis not present

## 2024-02-06 LAB — GLUCOSE, CAPILLARY
Glucose-Capillary: 126 mg/dL — ABNORMAL HIGH (ref 70–99)
Glucose-Capillary: 108 mg/dL — ABNORMAL HIGH (ref 70–99)
Glucose-Capillary: 122 mg/dL — ABNORMAL HIGH (ref 70–99)
Glucose-Capillary: 173 mg/dL — ABNORMAL HIGH (ref 70–99)

## 2024-02-06 LAB — RENAL FUNCTION PANEL
Albumin: 2.5 g/dL — ABNORMAL LOW (ref 3.5–5.0)
Anion gap: 6 (ref 5–15)
BUN: 39 mg/dL — ABNORMAL HIGH (ref 8–23)
CO2: 23 mmol/L (ref 22–32)
Calcium: 8.2 mg/dL — ABNORMAL LOW (ref 8.9–10.3)
Chloride: 108 mmol/L (ref 98–111)
Creatinine, Ser: 2.29 mg/dL — ABNORMAL HIGH (ref 0.44–1.00)
GFR, Estimated: 20 mL/min — ABNORMAL LOW (ref >60)
Glucose, Bld: 155 mg/dL — ABNORMAL HIGH (ref 70–99)
Phosphorus: 3.3 mg/dL (ref 2.5–4.6)
Potassium: 3.8 mmol/L (ref 3.5–5.1)
Sodium: 137 mmol/L (ref 135–145)

## 2024-02-06 LAB — MAGNESIUM: Magnesium: 1.7 mg/dL (ref 1.7–2.4)

## 2024-02-06 MED ORDER — GUAIFENESIN 100 MG/5ML PO LIQD: 5.0000 mL | ORAL | Status: DC | PRN

## 2024-02-06 MED ORDER — HYDRALAZINE HCL 50 MG PO TABS
50.0000 mg | ORAL_TABLET | Freq: Three times a day (TID) | ORAL | Status: DC
  Administered 2024-02-06 – 2024-02-07 (×4): 50 mg via ORAL
  Filled 2024-02-06 (×4): qty 1

## 2024-02-06 NOTE — Progress Notes (Signed)
 [facility] Liaison Note  Received request from for hospice services at home after discharge. Spoke with [name], daughter to initiate education related to hospice philosophy, services, and team approach to care. Patient/family verbalized understanding of information given. Per discussion, the plan is for discharge home tomorrow afternoon.   DME needs discussed. Patient has the hospital bed, bedside commode, multiple walkers, and shower chair in the home. Patient/family requests the following equipment for deliver: transport wheelchair. The address has been verified and is correct in the chart. [name] is the family contact to arrange time of equipment delivery.   Please provide prescriptions at discharge as needed to ensure ongoing symptom management.   [facility] information and contact numbers given to [facility]. Above information shared with Bard Herbert, Transitions of Care Manager. Please call with any questions or concerns.   Thank you for the opportunity to participate in this patient's care.   Glenna Fellows BSN, Charity fundraiser, OCN [employer] [phone]

## 2024-02-06 NOTE — Progress Notes (Signed)
 Unable to complete admission documentation dt patient AMS

## 2024-02-06 NOTE — Progress Notes (Signed)
 Right upper extremity venous duplex has been completed. Preliminary results can be found in CV Proc through chart review.   02/06/24 3:09 PM Olen Cordial RVT

## 2024-02-06 NOTE — Consult Note (Signed)
 Consultation Note Date: 02/06/2024   Patient Name: Diana Harper  DOB: 1936-03-18  MRN: [id number]  Age / Sex: 88 y.o., female  PCP: Lewis Moccasin, MD Referring Physician: Barnetta Chapel, MD  Reason for Consultation: Establishing goals of care and code status  HPI/Patient Profile: 88 y.o. female  with past medical history of type 2 diabetes, chronic kidney disease stage III, essential hypertension, recurrent vasovagal syncope, diverticulosis, history of asthma, history of biliary colic, hyperlipidemia, history of CVA with intracranial bleeds, and dementia was admitted on 02/03/2024 with recurrent syncope, hypertensive urgency, influenza A infection, acute on CKD stage III.   Of note, patient has had 3 admissions in the last 6 months.  Clinical Assessment and Goals of Care: I have reviewed medical records including EPIC notes, labs, any available advanced directives, and imaging. Received report from primary RN - no acute concerns.   Went to visit patient at bedside - no family/visitors present. Patient was lying in bed awake, alert, oriented to self only, and able to participate in simple conversation - her thoughts are scattered. No signs or non-verbal gestures of pain or discomfort noted. No respiratory distress, increased work of breathing, or secretions noted. Patient requests I call her daughter/Diana to "see when she's coming."  Met with daughter/Diana  to discuss diagnosis, prognosis, GOC, EOL wishes, disposition, and options.  I introduced Palliative Medicine as specialized medical care for people living with serious illness. It focuses on providing relief from the symptoms and stress of a serious illness. The goal is to improve quality of life for both the patient and the family.  We discussed a brief life review of the patient as well as functional and nutritional status. Patient is not  married - she has 4 children. Prior to hospitalization, patient was living in private residence with her daughter/Diana as her primary caregiver - [name] does get assistance from her sisters. Per [name], her siblings lean on her to make medial decisions for patient since she is the primary caregiver - patient does not have a HCPOA. At home, patient was able to ambulate with a walker, but did need assistance standing/sitting and someone on standby while she walked. She was not able to dress or bathe herself. She was receiving PT/OT at home but no other private caregiver services.  We discussed patient's current illness and what it means in the larger context of patient's on-going co-morbidities. [name] has a clear understanding of patient's current acute medial situation. We reviewed patient's high risk for rehospitalizations. Natural disease trajectory and expectations at EOL were discussed. I attempted to elicit values and goals of care important to the patient. The difference between aggressive medical intervention and comfort care was considered in light of the patient's goals of care.   Therapeutic listening provided as [name] tells me patient's goal is to "age in place." She would want to be kept comfortable in her own home and is not interested in rehospitalizations.  Provided education and counseling at length on the philosophy and benefits of hospice care. Discussed that it offers a holistic approach to care in the setting of end-stage illness, and is about supporting the patient where they are allowing nature to take it's course. Discussed the hospice team includes RNs, physicians, social workers, and chaplains. They can provide personal care, support for the family, and help keep patient out of the hospital as well as assist with DME needs for home hospice. Education provided on the difference between home vs residential hospice.  [name] states that patient is already enrolled in outpatient  Palliative Care with [facility] - she would like to work with them for hospice care.  Encouraged patient/family to consider DNR/DNI status understanding evidenced based poor outcomes in similar hospitalized patient, as the cause of arrest is likely associated with advanced chronic/terminal illness rather than an easily reversible acute cardio-pulmonary event.  I shared that even if we pursued resuscitation we would not able to resolve the underlying factors. I explained that DNR/DNI does not change the medical plan and it only comes into effect after a person has arrested (died).  It is a protective measure to keep [name] from harming the patient in their last moments of life. Diana would like to continue full code until patient gets home with hospice - she will be ready for code status change to DNR/DNI at that time. She is clear patient has expressed in the past that she would not want her life indefinitely prolonged with artificial life support.  [name] is able to take patient home today; however, due to personal barriers feels discharge tomorrow afternoon would be best. She indicates they need a wheelchair in the home.  Discussed with [name] the importance of continued conversation family and the medical providers regarding overall plan of care and treatment options, ensuring decisions are within the context of the patient's values and GOCs.    Questions and concerns were addressed. The patient/family was encouraged to call with questions and/or concerns. PMT number was provided.   Primary Decision Maker: NEXT OF KIN - daughter/Diana Harper    SUMMARY OF RECOMMENDATIONS   Continue to medically optimize while admitted Goal is for patient's discharge home with hospice Continue full code at this time - family will be ready for code status change to DNR/DNI once patient has returned home with hospice TOC notified and consult placed for: home hospice and DME/wheelchair needs [facility] liaison  notified their Palliative Care patient will be transitioning to hospice care PMT will continue to follow and support holistically   Code Status/Advance Care Planning: Full code  Palliative Prophylaxis:  Aspiration, Frequent Pain Assessment, Oral Care, and Turn Reposition  Additional Recommendations (Limitations, Scope, Preferences): Avoid Hospitalization, No Artificial Feeding, and No Tracheostomy  Psycho-social/Spiritual:  Desire for further Chaplaincy support:no Created space and opportunity for patient and family to express thoughts and feelings regarding patient's current medical situation.  Emotional support and therapeutic listening provided.  Prognosis:  < 6 months  Discharge Planning: Home with Hospice      Primary Diagnoses: Present on Admission:  Syncope, vasovagal  Acute kidney injury (HCC)  Dehydration  Acute renal failure superimposed on stage 3a chronic kidney disease (HCC)  Essential hypertension  Hypothyroidism  CKD stage 3 due to type 2 diabetes mellitus (HCC)  Uncontrolled type 2 diabetes mellitus with hyperglycemia, without long-term current use of insulin (HCC)  Hypertensive urgency  Dementia without behavioral disturbance (HCC)  Influenza A   I have reviewed the medical record, interviewed the patient and family, and examined the patient. The following aspects are pertinent.  Past Medical History:  Diagnosis Date   Anemia    Anxiety    Arthritis    Asthma    Biliary colic    C. difficile colitis 08/2022   Cholelithiasis    CKD (chronic kidney disease), stage III [facility])    nephrologist--  dr Reynolds Bowl   Colon polyps    Diverticulosis of colon    Gastric ulcer    Glaucoma    History of  asthma    1980's  no longer problem since 1980's   History of diverticulitis of colon    recurrent--  2014;  2013;  2012   Hyperlipidemia    Hypertension    Hypothyroidism    Insulin dependent type 2 diabetes mellitus (HCC)    followed by dr Jonny Ruiz  [place] ([facility])    Murmur    Peripheral neuropathy    Stroke [facility])    Vitamin D deficiency    Wears hearing aid    bilateral   Wears partial dentures    lower partial and upper full   Social History   Socioeconomic History   Marital status: Widowed    Spouse name: Not on file   Number of children: 4   Years of education: Not on file   Highest education level: Not on file  Occupational History   Occupation: retired  Tobacco Use   Smoking status: Never   Smokeless tobacco: Never  Vaping Use   Vaping status: Never Used  Substance and Sexual Activity   Alcohol use: No   Drug use: No   Sexual activity: Not on file  Other Topics Concern   Not on file  Social History Narrative   Are you right handed or left handed? Right   Are you currently employed ?    What is your current occupation? retired   Do you live at home alone?   Who lives with you? Daughter   What type of home do you live in: 1 story or 2 story? one   No Caffiene     Social Drivers of Corporate investment banker Strain: Not on file  Food Insecurity: No Food Insecurity (02/04/2024)   Hunger Vital Sign    Worried About Running Out of Food in the Last Year: Never true    Ran Out of Food in the Last Year: Never true  Transportation Needs: Unmet Transportation Needs (02/04/2024)   PRAPARE - Administrator, Civil Service (Medical): Yes    Lack of Transportation (Non-Medical): Yes  Physical Activity: Not on file  Stress: Not on file  Social Connections: Unknown (02/04/2024)   Social Connection and Isolation Panel [NHANES]    Frequency of Communication with Friends and Family: Never    Frequency of Social Gatherings with Friends and Family: More than three times a week    Attends Religious Services: Never    Database administrator or Organizations: No    Attends Engineer, structural: Never    Marital Status: Patient declined   Family History  Problem Relation Age of Onset   Stroke Mother     Colon polyps Mother    Diabetes Mother    Diabetes Father    Kidney failure Father    COPD Father    Colon polyps Sister    Diabetes Sister    Stroke Brother    Diabetes Brother    Heart attack Maternal Grandmother    Heart disease Maternal Grandfather    Diabetes Paternal Grandmother    Stroke Paternal Grandfather    Hypertension Paternal Grandfather    Diabetes Sister    Diabetes Sister    Diabetes Son    Diabetes Daughter    Colon cancer Neg Hx    Esophageal cancer Neg Hx    Inflammatory bowel disease Neg Hx    Liver disease Neg Hx    Pancreatic cancer Neg Hx    Rectal cancer Neg Hx  Stomach cancer Neg Hx    Scheduled Meds:  amLODipine  10 mg Oral Daily   ARIPiprazole  2 mg Oral Daily   atorvastatin  40 mg Oral Daily   carvedilol  12.5 mg Oral BID WC   feeding supplement  237 mL Oral BID BM   heparin injection (subcutaneous)  5,000 Units Subcutaneous Q12H   hydrALAZINE  50 mg Oral Q8H   insulin aspart  0-6 Units Subcutaneous TID WC   levothyroxine  88 mcg Oral Q0600   mouth rinse  15 mL Mouth Rinse 4 times per day   oseltamivir  30 mg Oral Daily   sertraline  50 mg Oral QHS   sodium chloride flush  3 mL Intravenous Q12H   Continuous Infusions: PRN Meds:.acetaminophen, guaiFENesin, hydrALAZINE, nitroGLYCERIN, ondansetron **OR** ondansetron (ZOFRAN) IV, mouth rinse Medications Prior to Admission:  Prior to Admission medications   Medication Sig Start Date End Date Taking? Authorizing Provider  acetaminophen (TYLENOL) 500 MG tablet Take 1,000 mg by mouth every 8 (eight) hours as needed for mild pain, headache or fever.   Yes [provider]  albuterol (VENTOLIN HFA) 108 (90 Base) MCG/ACT inhaler Inhale 2 puffs into the lungs every 4 (four) hours as needed for wheezing or shortness of breath.   Yes [provider]  ARIPiprazole (ABILIFY) 2 MG tablet Take 2 mg by mouth daily.   Yes [provider]  aspirin EC 81 MG tablet Take 1 tablet  (81 mg total) by mouth daily. Swallow whole. 10/15/23  Yes Vann, Jessica U, DO  atorvastatin (LIPITOR) 40 MG tablet Take 1 tablet (40 mg total) by mouth daily. 10/15/23  Yes Vann, Jessica U, DO  carvedilol (COREG) 6.25 MG tablet Take 6.25 mg by mouth 2 (two) times daily. 12/31/23  Yes [provider]  Cholecalciferol (VITAMIN D3) 125 MCG (5000 UT) CAPS Take 5,000 Units by mouth daily.   Yes [provider]  Continuous Blood Gluc Receiver (FREESTYLE LIBRE 2 READER) DEVI See admin instructions. 10/13/22  Yes [provider]  Continuous Blood Gluc Sensor (FREESTYLE LIBRE 14 DAY SENSOR) MISC Inject 1 Device into the skin every 14 (fourteen) days.   Yes [provider]  diclofenac Sodium (VOLTAREN) 1 % GEL Apply 2 g topically 4 (four) times daily as needed (joint pain).   Yes [provider]  docusate sodium (COLACE) 100 MG capsule Take 1 capsule (100 mg total) by mouth 2 (two) times daily. 12/02/23  Yes Berton Mount I, MD  Glucagon HCl 1 MG SOLR Inject 1 mg into the skin as needed (for a blood sugar <67).   Yes [provider]  insulin degludec (TRESIBA) 200 UNIT/ML FlexTouch Pen Inject 10 Units into the skin See admin instructions. 50 units SQ once daily if BG is greater than 150 Patient taking differently: Inject 18-24 Units into the skin See admin instructions. 50 units SQ once daily if BG is greater than 150 12/02/23  Yes Barnetta Chapel, MD  levothyroxine (SYNTHROID) 88 MCG tablet Take 88 mcg by mouth daily before breakfast.   Yes [provider]  nitroGLYCERIN (NITROSTAT) 0.4 MG SL tablet Place 1 tablet (0.4 mg total) under the tongue every 5 (five) minutes as needed for chest pain. 01/20/21 10/07/24 Yes Cantwell, Celeste C, PA-C  NOVOLOG FLEXPEN 100 UNIT/ML FlexPen Inject 5-10 Units into the skin See admin instructions. Inject 5 units into the skin before meals if BGL is 100-150, 7 units if BGL is 151-200, and 10 units if  BGL is greater  than 200 05/21/22  Yes [provider]  polyethylene glycol (MIRALAX / GLYCOLAX) 17 g packet Take 17 g by mouth daily as needed for mild constipation. 12/02/23  Yes Berton Mount I, MD  sertraline (ZOLOFT) 50 MG tablet Take 50 mg by mouth at bedtime.   Yes [provider]  amLODipine (NORVASC) 10 MG tablet Take 1 tablet (10 mg total) by mouth daily. Patient not taking: Reported on 02/04/2024 12/02/23   Berton Mount I, MD   Allergies  Allergen Reactions   Latex Anaphylaxis, Shortness Of Breath and Other (See Comments)    Severe respiratory distress   Onion Diarrhea   Other Other (See Comments)    Patient cannot have TREE NUTS and anything with seeds- History of  diverticulitis    Flexeril [Cyclobenzaprine] Other (See Comments)    Caused agitation   Lisinopril Cough   Metformin And Related Diarrhea   Simvastatin Hives and Rash   Tetracycline Rash   Zithromax [Azithromycin] Rash   Review of Systems  Unable to perform ROS: Dementia    Physical Exam Vitals and nursing note reviewed.  Constitutional:      General: She is not in acute distress.    Appearance: She is ill-appearing.  Pulmonary:     Effort: No respiratory distress.  Skin:    General: Skin is warm and dry.  Neurological:     Mental Status: She is alert. She is disoriented and confused.     Motor: Weakness present.  Psychiatric:        Behavior: Behavior is cooperative.        Cognition and Memory: Cognition is impaired. Memory is impaired.     Vital Signs: BP (!) 160/71   Pulse 63   Temp 98.7 F (37.1 C) (Oral)   Resp 19   Ht 5\' 6"  (1.676 m)   SpO2 100%   BMI 20.96 kg/m  Pain Scale: 0-10   Pain Score: 0-No pain   SpO2: SpO2: 100 % O2 Device:SpO2: 100 % O2 Flow Rate: .   IO: Intake/output summary:  Intake/Output Summary (Last 24 hours) at 02/06/2024 1253 Last data filed at 02/06/2024 4098 Gross per 24 hour  Intake 2237.33 ml  Output 600 ml  Net 1637.33 ml    LBM: Last BM  Date : 02/05/24 Baseline Weight:   Most recent weight:       Palliative Assessment/Data: PPS 40-50%     Time In: 1245 Time Out: 1400 Time Total: 75 minutes  Signed by: Haskel Khan, NP   Please contact Palliative Medicine Team phone at [phone] for questions and concerns.  For individual provider: See Amion  *Portions of this note are a verbal dictation therefore any spelling and/or grammatical errors are due to the "Dragon Medical One" system interpretation.

## 2024-02-06 NOTE — TOC CM/SW Note (Signed)
 Transition of Care [facility]) - Inpatient Brief Assessment   Patient Details  Name: Diana Harper MRN: [id number] Date of Birth: [birth year]  [facility] CM/SW Contact:    Tom-Johnson, Hershal Coria, RN Phone Number: 02/06/2024, 1:39 PM   Clinical Narrative:  Patient presented to the ED with recurrent Syncope, AKI on Chronic CKD Stage IIIb/IV urgency. Admitted with Influenza A. On Tamiflu.   From home with daughter, Diana Harper whom is patient's primary caregiver. [name]'s interested in patient discharging home with [facility], patient active with [facility]iative services. [facility] Liaison notified, will f/u with patient and family.   CM will continue to follow .         Transition of Care Asessment: Insurance and Status: Insurance coverage has been reviewed Patient has primary care physician: Yes Home environment has been reviewed: Yes Prior level of function:: Modified independent Prior/Current Home Services: Current home services ([facility]) Social Drivers of Health Review: SDOH reviewed no interventions necessary Readmission risk has been reviewed: Yes Transition of care needs: transition of care needs identified, TOC will continue to follow

## 2024-02-06 NOTE — Progress Notes (Signed)
 PROGRESS NOTE    Diana Harper  ZOX:[id number] DOB: 03/02/1936 DOA: 02/03/2024 PCP: Lewis Moccasin, MD  Outpatient Specialists:     Brief Narrative:  Patient is an 88 year old female, past medical history significant for type 2 diabetes mellitus, chronic kidney disease stage IIIb/IV, with baseline serum creatinine of 1.68; hypertension, recurrent vasovagal syncope, diverticulosis, asthma, hyperlipidemia, and history of CVA with intracranial bleed.  Patient was admitted with recurrent syncope, hypertensive urgency, influenza A and acute kidney injury on chronic kidney disease stage IIIb/IV.  02/04/2024: Patient seen.  Also discussed with patient's nurse.  No history from patient.  Blood pressure is down to 177/88 mmHg (from 217/87 mmHg).  Serum creatinine on presentation was 2.4.  No repeat renal panel visualized.  Will restart patient's Coreg 6.25 Mg p.o. twice daily.  Will add amlodipine.  Will place patient on telemetry.  Will also consult palliative care team to assist with CODE STATUS and goal of care.  Patient is currently full code.  Patient looks volume depleted.  02/05/2024: Patient seen.  Patient looks better today.  Patient is more communicative.  Mild swelling of right lower arm noted.  Will proceed with venous Doppler ultrasound of the extremities.  Continue IV fluids.  Urinalysis and urine sodium.  PT/OT consult.  02/06/2024: Patient seen.  Also discussed with the patient's daughter extensively, [name] Gosse.  Patient's daughter thinks the patient is improving.  Will repeat renal panel today.  Will also get a chest x-ray.  Possible discharge later today if patient's daughter feels that patient is back to her baseline.   Assessment & Plan:   Principal Problem:   Influenza A Active Problems:   Syncope, vasovagal   History of CVA (cerebrovascular accident)   Acute kidney injury (HCC)   Dehydration   Acute renal failure superimposed on stage 3a chronic kidney disease  (HCC)   CKD stage 3 due to type 2 diabetes mellitus (HCC)   Essential hypertension   Hypothyroidism   Uncontrolled type 2 diabetes mellitus with hyperglycemia, without long-term current use of insulin (HCC)   Hypertensive urgency   Dementia without behavioral disturbance (HCC)   Recurrent syncope:  -Patient has history of recurrent vasovagal syncope.  -Current syncope occurred when patient was using the bathroom -Patient was also volume depleted. -Continue  IV fluid -Continue telemetry monitoring.   -Optimize blood pressure -Manage influenza A supportively. -PT/OT input is highly appreciated.   -Palliative care consult.   Hypertensive urgency:  -Blood pressure control is slowly improving. -Continue Coreg 12.5 Mg p.o. twice daily and amlodipine 10 Mg p.o. once daily.   -Add hydralazine 50 Mg p.o. every 8 hourly. -Gradually optimize blood pressure.  Volume depletion: -Continue IV fluids. -Urinalysis reveals specific gravity of 1.018. : Urine sodium was 73.  Patient has been on IV normal saline.     History of CVA: -Stable. -Continue to monitor.     Uncontrolled type 2 diabetes:  Initiate sliding scale insulin. Blood sugars currently controlled. -Calorie count.   Influenza A infection:  -Supportive care -Tamiflu 30 Mg p.o. twice daily for 5 days.     Acute kidney injury on chronic kidney disease stage IIIb/IV: -Likely multifactorial. -Likely secondary to hemodynamic instability and volume depletion.   -Specific gravity of 1.018 -Continue IV fluid -Gradually controlled blood pressure. -Check renal function today.   -Dose all medications assuming estimated GFR of less than 15 mL/min per 1.73 m. -Avoid nephrotoxins. -Keep MAP greater than 65 mmHg.     Hypothyroidism:  -  Continue levothyroxine   Dementia:  -No behavioral problems.     Normocytic anemia:  -Iron deficiency/chronic inflammation -Folate deficiency.   -Repeat folate level and vitamin B12 level in  the morning.  Right upper extremity swelling: -Possibly due to IV infiltration. -Doppler ultrasound of the upper extremities.   DVT prophylaxis: Start subcutaneous heparin. Code Status: Full code. Family Communication: Daughter, Alejah Aristizabal. Disposition Plan: Patient remains inpatient.  Consult palliative care team.  PT OT.   Consultants:  None.  Procedures:  None.  Antimicrobials:  Tamiflu.   Subjective: Patient is more communicative today.  Objective: Vitals:   02/05/24 2030 02/06/24 0500 02/06/24 0726 02/06/24 0855  BP:  (!) 140/65 (!) 160/71 (!) 160/71  Pulse:  77 63 63  Resp: 18  19   Temp: 98.6 F (37 C) 98.7 F (37.1 C)    TempSrc: Oral Oral    SpO2: 93% 95% 100%     Intake/Output Summary (Last 24 hours) at 02/06/2024 1037 Last data filed at 02/06/2024 7846 Gross per 24 hour  Intake 2237.33 ml  Output 600 ml  Net 1637.33 ml   There were no vitals filed for this visit.  Examination:  General exam: Appears calm and comfortable.  Looks much better today.[name] Respiratory system: Clear to auscultation.  Cardiovascular system: S1 & S2 heard, systolic murmur.   Gastrointestinal system: Abdomen is non-distended Central nervous system: Awake and alert.  Patient is communicative. Extremities: No leg edema.  Swelling of right upper extremity is improving.  Data Reviewed: I have personally reviewed following labs and imaging studies  CBC: Recent Labs  Lab 02/03/24 1918 02/04/24 1055 02/05/24 0536  WBC 8.5 5.9 4.2  NEUTROABS 6.4 4.9 2.9  HGB 10.7* 10.0* 9.4*  HCT 31.9* 28.7* 27.7*  MCV 88.1 85.4 86.6  PLT 215 201 169   Basic Metabolic Panel: Recent Labs  Lab 02/03/24 1918 02/04/24 1055 02/05/24 0536  NA 139 139 138  K 3.6 3.8 3.8  CL 110 107 107  CO2 23 20* 26  GLUCOSE 108* 189* 145*  BUN 37* 33* 37*  CREATININE 2.40* 2.34* 2.37*  CALCIUM 8.4* 8.9 8.7*  MG  --  1.7 1.7  PHOS  --  3.2 3.0   GFR: CrCl cannot be calculated (Unknown ideal  weight.). Liver Function Tests: Recent Labs  Lab 02/04/24 1055 02/05/24 0536  ALBUMIN 3.1* 3.0*   No results for input(s): "LIPASE", "AMYLASE" in the last 168 hours. No results for input(s): "AMMONIA" in the last 168 hours. Coagulation Profile: No results for input(s): "INR", "PROTIME" in the last 168 hours. Cardiac Enzymes: No results for input(s): "CKTOTAL", "CKMB", "CKMBINDEX", "TROPONINI" in the last 168 hours. BNP (last 3 results) No results for input(s): "PROBNP" in the last 8760 hours. HbA1C: No results for input(s): "HGBA1C" in the last 72 hours. CBG: Recent Labs  Lab 02/05/24 0724 02/05/24 1118 02/05/24 1629 02/05/24 2030 02/06/24 0731  GLUCAP 126* 169* 122* 137* 126*   Lipid Profile: No results for input(s): "CHOL", "HDL", "LDLCALC", "TRIG", "CHOLHDL", "LDLDIRECT" in the last 72 hours. Thyroid Function Tests: No results for input(s): "TSH", "T4TOTAL", "FREET4", "T3FREE", "THYROIDAB" in the last 72 hours. Anemia Panel: Recent Labs    02/05/24 0536  VITAMINB12 466  FOLATE 4.9*   Urine analysis:    Component Value Date/Time   COLORURINE YELLOW 02/05/2024 2222   APPEARANCEUR HAZY (A) 02/05/2024 2222   LABSPEC 1.018 02/05/2024 2222   PHURINE 5.0 02/05/2024 2222   GLUCOSEU NEGATIVE 02/05/2024 2222   HGBUR  SMALL (A) 02/05/2024 2222   BILIRUBINUR NEGATIVE 02/05/2024 2222   KETONESUR NEGATIVE 02/05/2024 2222   PROTEINUR >=300 (A) 02/05/2024 2222   UROBILINOGEN 0.2 04/15/2012 1217   NITRITE NEGATIVE 02/05/2024 2222   LEUKOCYTESUR SMALL (A) 02/05/2024 2222   Sepsis Labs: @LABRCNTIP (procalcitonin:4,lacticidven:4)  ) Recent Results (from the past 240 hours)  Resp panel by RT-PCR (RSV, Flu A&B, Covid) Anterior Nasal Swab     Status: Abnormal   Collection Time: 02/03/24  7:18 PM   Specimen: Anterior Nasal Swab  Result Value Ref Range Status   SARS Coronavirus 2 by RT PCR NEGATIVE NEGATIVE Final    Comment: (NOTE) SARS-CoV-2 target nucleic acids are NOT  DETECTED.  The SARS-CoV-2 RNA is generally detectable in upper respiratory specimens during the acute phase of infection. The lowest concentration of SARS-CoV-2 viral copies this assay can detect is 138 copies/mL. A negative result does not preclude SARS-Cov-2 infection and should not be used as the sole basis for treatment or other patient management decisions. A negative result may occur with  improper specimen collection/handling, submission of specimen other than nasopharyngeal swab, presence of viral mutation(s) within the areas targeted by this assay, and inadequate number of viral copies(<138 copies/mL). A negative result must be combined with clinical observations, patient history, and epidemiological information. The expected result is Negative.  Fact Sheet for Patients:  BloggerCourse.com  Fact Sheet for Healthcare Providers:  SeriousBroker.it  This test is no t yet approved or cleared by the [place] FDA and  has been authorized for detection and/or diagnosis of SARS-CoV-2 by FDA under an Emergency Use Authorization (EUA). This EUA will remain  in effect (meaning this test can be used) for the duration of the COVID-19 declaration under Section 564(b)(1) of the Act, 21 U.S.C.section 360bbb-3(b)(1), unless the authorization is terminated  or revoked sooner.       Influenza A by PCR POSITIVE (A) NEGATIVE Final   Influenza B by PCR NEGATIVE NEGATIVE Final    Comment: (NOTE) The Xpert Xpress SARS-CoV-2/FLU/RSV plus assay is intended as an aid in the diagnosis of influenza from Nasopharyngeal swab specimens and should not be used as a sole basis for treatment. Nasal washings and aspirates are unacceptable for Xpert Xpress SARS-CoV-2/FLU/RSV testing.  Fact Sheet for Patients: BloggerCourse.com  Fact Sheet for Healthcare Providers: SeriousBroker.it  This test is not  yet approved or cleared by the [place] FDA and has been authorized for detection and/or diagnosis of SARS-CoV-2 by FDA under an Emergency Use Authorization (EUA). This EUA will remain in effect (meaning this test can be used) for the duration of the COVID-19 declaration under Section 564(b)(1) of the Act, 21 U.S.C. section 360bbb-3(b)(1), unless the authorization is terminated or revoked.     Resp Syncytial Virus by PCR NEGATIVE NEGATIVE Final    Comment: (NOTE) Fact Sheet for Patients: BloggerCourse.com  Fact Sheet for Healthcare Providers: SeriousBroker.it  This test is not yet approved or cleared by the [place] FDA and has been authorized for detection and/or diagnosis of SARS-CoV-2 by FDA under an Emergency Use Authorization (EUA). This EUA will remain in effect (meaning this test can be used) for the duration of the COVID-19 declaration under Section 564(b)(1) of the Act, 21 U.S.C. section 360bbb-3(b)(1), unless the authorization is terminated or revoked.  Performed at [facility], [address] [place], [place] [address]          Radiology Studies: No results found.       Scheduled Meds:  amLODipine  10 mg Oral Daily   ARIPiprazole  2 mg Oral Daily   atorvastatin  40 mg Oral Daily   carvedilol  12.5 mg Oral BID WC   feeding supplement  237 mL Oral BID BM   heparin injection (subcutaneous)  5,000 Units Subcutaneous Q12H   insulin aspart  0-6 Units Subcutaneous TID WC   levothyroxine  88 mcg Oral Q0600   mouth rinse  15 mL Mouth Rinse 4 times per day   oseltamivir  30 mg Oral Daily   sertraline  50 mg Oral QHS   sodium chloride flush  3 mL Intravenous Q12H   Continuous Infusions:  sodium chloride 75 mL/hr at 02/06/24 0330     LOS: 3 days    Time spent: 35 minutes.    Berton Mount, MD  [facility] Pager #: [phone] 7PM-7AM contact night coverage as  above

## 2024-02-06 NOTE — Plan of Care (Signed)
  Problem: Nutritional: Goal: Maintenance of adequate nutrition will improve Outcome: Completed/Met

## 2024-02-07 ENCOUNTER — Other Ambulatory Visit (HOSPITAL_COMMUNITY): Payer: Self-pay

## 2024-02-07 DIAGNOSIS — N179 Acute kidney failure, unspecified: Secondary | ICD-10-CM | POA: Diagnosis not present

## 2024-02-07 DIAGNOSIS — R638 Other symptoms and signs concerning food and fluid intake: Secondary | ICD-10-CM

## 2024-02-07 DIAGNOSIS — J101 Influenza due to other identified influenza virus with other respiratory manifestations: Secondary | ICD-10-CM | POA: Diagnosis not present

## 2024-02-07 DIAGNOSIS — Z515 Encounter for palliative care: Secondary | ICD-10-CM | POA: Diagnosis not present

## 2024-02-07 DIAGNOSIS — Z7189 Other specified counseling: Secondary | ICD-10-CM | POA: Diagnosis not present

## 2024-02-07 DIAGNOSIS — Z789 Other specified health status: Secondary | ICD-10-CM | POA: Diagnosis not present

## 2024-02-07 DIAGNOSIS — Z711 Person with feared health complaint in whom no diagnosis is made: Secondary | ICD-10-CM | POA: Diagnosis not present

## 2024-02-07 DIAGNOSIS — I159 Secondary hypertension, unspecified: Secondary | ICD-10-CM | POA: Diagnosis not present

## 2024-02-07 LAB — GLUCOSE, CAPILLARY
Glucose-Capillary: 188 mg/dL — ABNORMAL HIGH (ref 70–99)
Glucose-Capillary: 191 mg/dL — ABNORMAL HIGH (ref 70–99)
Glucose-Capillary: 161 mg/dL — ABNORMAL HIGH (ref 70–99)

## 2024-02-07 MED ORDER — ACETAMINOPHEN 325 MG RE SUPP
325.0000 mg | RECTAL | 0 refills | Status: DC | PRN
Start: 2024-02-07 — End: 2024-08-13
  Filled 2024-02-07: qty 12, 2d supply, fill #0

## 2024-02-07 MED ORDER — ENSURE ENLIVE PO LIQD
237.0000 mL | Freq: Two times a day (BID) | ORAL | 12 refills | Status: AC
  Filled 2024-02-07: qty 237, 1d supply, fill #0

## 2024-02-07 MED ORDER — ORAL CARE MOUTH RINSE: 15.0000 mL | OROMUCOSAL | Status: DC | PRN

## 2024-02-07 MED ORDER — CARVEDILOL 3.125 MG PO TABS
3.1250 mg | ORAL_TABLET | Freq: Two times a day (BID) | ORAL | 1 refills | Status: DC
  Filled 2024-02-07: qty 60, 30d supply, fill #0

## 2024-02-07 MED ORDER — INSULIN DEGLUDEC 200 UNIT/ML ~~LOC~~ SOPN
6.0000 [IU] | PEN_INJECTOR | SUBCUTANEOUS | 1 refills | Status: DC
  Filled 2024-02-07: qty 3, 100d supply, fill #0

## 2024-02-07 MED ORDER — AMLODIPINE BESYLATE 10 MG PO TABS
10.0000 mg | ORAL_TABLET | Freq: Every day | ORAL | 1 refills | Status: AC
  Filled 2024-02-07: qty 30, 30d supply, fill #0

## 2024-02-07 NOTE — Discharge Summary (Signed)
 Physician Discharge Summary  Patient ID: Diana Harper MRN: [id number] DOB/AGE: 86-03-1936 86 y.o.  Admit date: 02/03/2024 Discharge date: 02/07/2024  Admission Diagnoses:  Discharge Diagnoses:  Principal Problem:   Influenza A Active Problems:   Syncope, vasovagal   History of CVA (cerebrovascular accident)   Dehydration   Acute renal failure superimposed on stage 3b/4 chronic kidney disease (HCC)   CKD stage 3 due to type 2 diabetes mellitus (HCC)   Essential hypertension   Hypothyroidism   Uncontrolled type 2 diabetes mellitus with hyperglycemia, without long-term current use of insulin (HCC)   Hypertensive urgency   Dementia without behavioral disturbance (HCC)   Discharged Condition: stable  Hospital Course:  Patient is an 88 year old female with past medical history significant for type 2 diabetes mellitus, chronic kidney disease stage IIIb/IV, with baseline serum creatinine of 1.68; hypertension, recurrent vasovagal syncope, diverticulosis, asthma, hyperlipidemia, and history of CVA with intracranial bleed.  Patient was admitted and managed for recurrent syncope, hypertensive urgency, influenza A and acute kidney injury on chronic kidney disease stage IIIb/IV.  Recurrent syncope:  -Patient has history of recurrent vasovagal syncope.  -Current syncope occurred when patient was using the bathroom -Patient was also volume depleted. -Patient was volume resuscitated.   -Managed influenza A supportively. -PT/OT input is highly appreciated.   -Palliative care consult.   Hypertensive urgency:  -Blood pressure control slowly improved.   -Continue Coreg 12.5 Mg p.o. twice daily and amlodipine 10 Mg p.o. once daily.   -Added hydralazine 50 Mg p.o. every 8 hourly. -Gradually optimize blood pressure.   Volume depletion: -Managed with IV fluids. -Urinalysis revealed specific gravity of 1.018.    History of CVA: -Stable. -Continue to monitor.     Uncontrolled type 2  diabetes:  -Sliding scale insulin coverage. -Calorie count.   Influenza A infection:  -Supportive care -Tamiflu 30 Mg p.o. twice daily for 5 days.     Acute kidney injury on chronic kidney disease stage IIIb/IV: -Likely multifactorial. -Likely secondary to hemodynamic instability and volume depletion.   -Specific gravity of 1.018 -Gradually controlled blood pressure. -Continue to avoid nephrotoxins. -Keep MAP greater than 65 mmHg.     Hypothyroidism:  -Continue levothyroxine   Dementia:  -No behavioral problems.     Normocytic anemia:  -Iron deficiency/chronic inflammation -Folate deficiency.   -Repeat folate level and vitamin B12 level in the morning.   Right upper extremity swelling: -Possibly due to IV infiltration. -Doppler ultrasound of the upper extremities.      Consults: Palliative care management  Significant Diagnostic Studies:  Positive influenza A.   PORTABLE CHEST 1 VIEW:   COMPARISON:  11/30/2023   FINDINGS: Low lung volumes. Mild cardiomegaly. No acute airspace disease, pleural effusion or pneumothorax   IMPRESSION: No active disease.  Low lung volumes with mild cardiomegaly     Electronically Signed   By: Jasmine Pang M.D.   On: 02/04/2024 00:43    Vascular ultrasound of upper extremity venous duplex: Negative.   Discharge Exam: Blood pressure 125/74, pulse 65, temperature (!) 97.3 F (36.3 C), temperature source Oral, resp. rate 16, height 5\' 6"  (1.676 m), SpO2 100%.   Disposition: Discharge disposition: 50-Hospice/Home       Discharge Instructions     Diet - low sodium heart healthy   Complete by: As directed    Increase activity slowly   Complete by: As directed       Allergies as of 02/07/2024       Reactions   Latex Anaphylaxis,  Shortness Of Breath, Other (See Comments)   Severe respiratory distress   Onion Diarrhea   Other Other (See Comments)   Patient cannot have TREE NUTS and anything with seeds- History  of  diverticulitis    Flexeril [cyclobenzaprine] Other (See Comments)   Caused agitation   Lisinopril Cough   Metformin And Related Diarrhea   Simvastatin Hives, Rash   Tetracycline Rash   Zithromax [azithromycin] Rash        Medication List     STOP taking these medications    acetaminophen 500 MG tablet Commonly known as: TYLENOL Replaced by: acetaminophen 325 MG suppository   diclofenac Sodium 1 % Gel Commonly known as: VOLTAREN   docusate sodium 100 MG capsule Commonly known as: COLACE       TAKE these medications    acetaminophen 325 MG suppository Commonly known as: TYLENOL Place 1 suppository (325 mg total) rectally every 4 (four) hours as needed for fever or mild pain (pain score 1-3). Replaces: acetaminophen 500 MG tablet   albuterol 108 (90 Base) MCG/ACT inhaler Commonly known as: VENTOLIN HFA Inhale 2 puffs into the lungs every 4 (four) hours as needed for wheezing or shortness of breath.   amLODipine 10 MG tablet Commonly known as: NORVASC Take 1 tablet (10 mg total) by mouth daily.   ARIPiprazole 2 MG tablet Commonly known as: ABILIFY Take 2 mg by mouth daily.   aspirin EC 81 MG tablet Take 1 tablet (81 mg total) by mouth daily. Swallow whole.   atorvastatin 40 MG tablet Commonly known as: LIPITOR Take 1 tablet (40 mg total) by mouth daily.   carvedilol 3.125 MG tablet Commonly known as: COREG Take 1 tablet (3.125 mg total) by mouth 2 (two) times daily. What changed:  medication strength how much to take   feeding supplement Liqd Take 237 mLs by mouth 2 (two) times daily between meals.   FreeStyle Libre 14 Day Sensor Misc Inject 1 Device into the skin every 14 (fourteen) days.   FreeStyle [place] 2 Reader Hardie Pulley See admin instructions.   Glucagon HCl 1 MG Solr Inject 1 mg into the skin as needed (for a blood sugar <67).   insulin degludec 200 UNIT/ML FlexTouch Pen Commonly known as: TRESIBA Inject 6 Units into the skin daily. What  changed:  how much to take when to take this additional instructions   levothyroxine 88 MCG tablet Commonly known as: SYNTHROID Take 88 mcg by mouth daily before breakfast.   mouth rinse Liqd solution 15 mLs by Mouth Rinse route as needed (oral care).   nitroGLYCERIN 0.4 MG SL tablet Commonly known as: NITROSTAT Place 1 tablet (0.4 mg total) under the tongue every 5 (five) minutes as needed for chest pain.   NovoLOG FlexPen 100 UNIT/ML FlexPen Generic drug: insulin aspart Inject 5-10 Units into the skin See admin instructions. Inject 5 units into the skin before meals if BGL is 100-150, 7 units if BGL is 151-200, and 10 units if BGL is greater than 200   polyethylene glycol 17 g packet Commonly known as: MIRALAX / GLYCOLAX Take 17 g by mouth daily as needed for mild constipation.   sertraline 50 MG tablet Commonly known as: ZOLOFT Take 50 mg by mouth at bedtime.   Vitamin D3 125 MCG (5000 UT) Caps Take 5,000 Units by mouth daily.        Time spent: 35 Minutes  5 minutes  Signed: Barnetta Chapel 02/07/2024, 12:51 PM

## 2024-02-07 NOTE — TOC Transition Note (Signed)
 Transition of Care [facility]) - Discharge Note   Patient Details  Name: Diana Harper MRN: [id number] Date of Birth: Dec 02, 1935  Transition of Care [facility] CM/SW Contact:  Tom-Johnson, Hershal Coria, RN Phone Number: 02/07/2024, 1:52 PM   Clinical Narrative:     Patient is scheduled for discharge today with [facility] care.  Readmission Risk Assessment done. Hospital f/u and discharge instructions on AVS. Prescriptions sent to [facility] pharmacy and patient will receive meds prior discharge. PTAR scheduled to transport at discharge.  No further TOC needs noted.       Final next level of care: Home w Hospice Care Barriers to Discharge: Barriers Resolved   Patient Goals and CMS Choice Patient states their goals for this hospitalization and ongoing recovery are:: To return home with Hospice care          Discharge Placement                       Discharge Plan and Services Additional resources added to the After Visit Summary for                                       Social Drivers of Health (SDOH) Interventions SDOH Screenings   Food Insecurity: No Food Insecurity (02/04/2024)  Housing: Unknown (02/04/2024)  Transportation Needs: Unmet Transportation Needs (02/04/2024)  Utilities: Not At Risk (02/04/2024)  Social Connections: Unknown (02/04/2024)  Tobacco Use: Low Risk  (02/03/2024)     Readmission Risk Interventions    02/07/2024    1:50 PM 07/27/2022   10:46 AM  Readmission Risk Prevention Plan  Transportation Screening Complete Complete  PCP or Specialist Appt within 3-5 Days  Complete  HRI or Home Care Consult  Complete  Social Work Consult for Recovery Care Planning/Counseling  Complete  Palliative Care Screening  Not Applicable  Medication Review [facility]) Referral to Pharmacy Complete  PCP or Specialist appointment within 3-5 days of discharge Complete   HRI or Home Care Consult Complete   SW Recovery Care/Counseling Consult  Complete   Palliative Care Screening Not Applicable   Skilled Nursing Facility Not Applicable

## 2024-02-07 NOTE — Progress Notes (Signed)
 Unable to complete admission documentation dt patient aphasia and AMS, per Primary RN this is baseline

## 2024-02-07 NOTE — Progress Notes (Signed)
 SLP Cancellation Note  Patient Details Name: Diana Harper MRN: [id number] DOB: February 06, 1936   Cancelled treatment:        Orders for cognitive linguistic assessment received and appreciated.  RN does not believe pt is able to participate at this time.  Pt with baseline dementia and hx prior CVA.  Plans for discharge home with hospice care.  Spoke briefly with MD via secure chat.  Will defer cognitive linguistic evaluation at this time.  SLP will sign off.    Kerrie Pleasure, MA, CCC-SLP Acute Rehabilitation Services Office: [phone] 02/07/2024, 9:53 AM

## 2024-02-07 NOTE — Progress Notes (Signed)
 Daily Progress Note   Patient Name: Diana Harper       Date: 02/07/2024 DOB: Jul 03, 1936  Age: 88 y.o. MRN#: [id number] Attending Physician: Barnetta Chapel, MD Primary Care Physician: Lewis Moccasin, MD Admit Date: 02/03/2024  Reason for Consultation/Follow-up: Establishing goals of care  Subjective: I have reviewed medical records including EPIC notes, MAR, any available advanced directives as necessary, and labs. Received report from primary RN - no acute concerns. Per RN, patient has slept most of the day, has poor PO intake.  Went to visit patient at bedside - no family/visitors present. Patient is lying in bed asleep - I did not attempt to wake her to preserve comfort. No signs or non-verbal gestures of pain or discomfort noted. No respiratory distress, increased work of breathing, or secretions noted.   Called daughter/[name] - emotional support provided. She confirms goals for patient's discharge today home with hospice. She notes wheelchair should be delivered today after 4p and they have a meeting with hospice staff on Friday. She has no other concerns/questions.  She expresses appreciation for PMT assistance.  All questions and concerns addressed. Encouraged to call with questions and/or concerns. PMT card previously provided.  Length of Stay: 4  Current Medications: Scheduled Meds:   amLODipine  10 mg Oral Daily   ARIPiprazole  2 mg Oral Daily   atorvastatin  40 mg Oral Daily   carvedilol  12.5 mg Oral BID WC   feeding supplement  237 mL Oral BID BM   heparin injection (subcutaneous)  5,000 Units Subcutaneous Q12H   hydrALAZINE  50 mg Oral Q8H   insulin aspart  0-6 Units Subcutaneous TID WC   levothyroxine  88 mcg Oral Q0600   mouth rinse  15 mL Mouth Rinse 4 times  per day   oseltamivir  30 mg Oral Daily   sertraline  50 mg Oral QHS   sodium chloride flush  3 mL Intravenous Q12H    Continuous Infusions:   PRN Meds: acetaminophen, guaiFENesin, nitroGLYCERIN, ondansetron **OR** ondansetron (ZOFRAN) IV, mouth rinse  Physical Exam Vitals and nursing note reviewed.  Constitutional:      General: She is not in acute distress.    Appearance: She is ill-appearing.  Pulmonary:     Effort: No respiratory distress.  Skin:    General: Skin is warm  and dry.  Neurological:     Comments: asleep             Vital Signs: BP 125/74   Pulse 65   Temp (!) 97.3 F (36.3 C) (Oral)   Resp 16   Ht 5\' 6"  (1.676 m)   SpO2 100%   BMI 20.96 kg/m  SpO2: SpO2: 100 % O2 Device: O2 Device: Room Air O2 Flow Rate:    Intake/output summary:  Intake/Output Summary (Last 24 hours) at 02/07/2024 1402 Last data filed at 02/07/2024 0800 Gross per 24 hour  Intake 240 ml  Output 200 ml  Net 40 ml   LBM: Last BM Date : 02/05/24 Baseline Weight:   Most recent weight:         Palliative Assessment/Data: PPS 40-50%      Patient Active Problem List   Diagnosis Date Noted   Influenza A 02/03/2024   Hypertensive urgency 10/08/2023   Dementia without behavioral disturbance (HCC) 10/08/2023   Acute ischemic stroke (HCC) 04/18/2023   Myelopathy (HCC) 01/09/2023   Acute metabolic encephalopathy 09/15/2022   Vitamin B12 deficiency 09/15/2022   Thiamine deficiency 09/15/2022   Back pain 09/15/2022   CKD (chronic kidney disease), stage IV (HCC) 09/15/2022   Hypokalemia 09/15/2022   AMS (altered mental status) 09/12/2022   C. difficile colitis 07/25/2022   Anemia 07/24/2022   Uncontrolled type 2 diabetes mellitus with hyperglycemia, without long-term current use of insulin (HCC) 07/17/2022   Acute diverticulitis 04/06/2021   CKD stage 3 due to type 2 diabetes mellitus (HCC) 04/06/2021   Essential hypertension 04/06/2021   Hypothyroidism    Depression     COPD (chronic obstructive pulmonary disease) (HCC)    Anorexia 02/11/2021   Bloating symptom 02/11/2021   History of colon polyps 02/09/2021   Hemorrhoids 02/09/2021   Rectal bleeding 02/09/2021   Fecal smearing 02/09/2021   Acute renal failure superimposed on stage 3a chronic kidney disease (HCC) 12/13/2019   Acute kidney injury (HCC) 12/12/2019   Diarrhea 12/12/2019   Dehydration 12/12/2019   Cerebral thrombosis with cerebral infarction 10/10/2017   Cerebral embolism with cerebral infarction 10/10/2017   Subarachnoid hemorrhage 10/10/2017   Intracerebral hemorrhage 10/10/2017   History of CVA (cerebrovascular accident) 10/10/2017   Chronic cholecystitis with calculus 08/25/2016   Syncope, vasovagal 11/15/2011   Diverticulitis 11/15/2011    Palliative Care Assessment & Plan   Patient Profile: 88 y.o. female  with past medical history of type 2 diabetes, chronic kidney disease stage III, essential hypertension, recurrent vasovagal syncope, diverticulosis, history of asthma, history of biliary colic, hyperlipidemia, history of CVA with intracranial bleeds, and dementia was admitted on 02/03/2024 with recurrent syncope, hypertensive urgency, influenza A infection, acute on CKD stage III.    Of note, patient has had 3 admissions in the last 6 months.  Assessment: Principal Problem:   Influenza A Active Problems:   Syncope, vasovagal   History of CVA (cerebrovascular accident)   Acute kidney injury (HCC)   Dehydration   Acute renal failure superimposed on stage 3a chronic kidney disease (HCC)   CKD stage 3 due to type 2 diabetes mellitus (HCC)   Essential hypertension   Hypothyroidism   Uncontrolled type 2 diabetes mellitus with hyperglycemia, without long-term current use of insulin (HCC)   Hypertensive urgency   Dementia without behavioral disturbance (HCC)   Recommendations/Plan: Plan for discharge home with hospice today Continue full code - family will be ready for code  status change to DNR/DNI once patient has  returned home with hospice  PMT will continue to follow peripherally. If there are any imminent needs please call the service directly  Goals of Care and Additional Recommendations: Limitations on Scope of Treatment: Avoid Hospitalization  Code Status:    Code Status Orders  (From admission, onward)           Start     Ordered   02/03/24 2357  Full code  Continuous       Question:  By:  Answer:  Consent: discussion documented in EHR   02/03/24 2358           Code Status History     Date Active Date Inactive Code Status Order ID Comments User Context   11/30/2023 2248 12/02/2023 2000 Full Code [id number]  Therisa Doyne, MD ED   10/08/2023 1633 10/14/2023 1958 Full Code [id number]  Hughie Closs, MD ED   04/19/2023 0037 04/21/2023 1906 Full Code [id number]  Darlin Drop, DO ED   09/12/2022 2048 09/17/2022 0057 Full Code [id number]  Darlin Drop, DO ED   07/24/2022 1517 07/28/2022 1904 Full Code [id number]  Bobette Mo, MD ED   07/15/2022 1732 07/17/2022 2346 Full Code [id number]  Teddy Spike, DO Inpatient   04/06/2021 1319 04/09/2021 1926 Full Code [id number]  Lucile Shutters, MD ED   12/12/2019 1432 12/14/2019 1709 Full Code [id number]  Cathren Harsh, MD Inpatient   10/10/2017 2025 10/12/2017 0100 Full Code [id number]  Rhetta Mura, MD Inpatient   08/25/2016 1842 08/26/2016 2357 Full Code [id number]  Kinsinger, De Blanch, MD Inpatient   11/15/2011 0132 11/15/2011 1859 Full Code [id number]  Dwan Bolt, RN Inpatient       Prognosis:  < 3 months  Discharge Planning: Home with Hospice  Care plan was discussed with primary RN, patient's daughter  Thank you for allowing the Palliative Medicine Team to assist in the care of this patient.   Total Time 35 minutes Prolonged Time Billed  no       Haskel Khan, NP  Please contact Palliative Medicine Team phone at [phone] for questions and concerns.    *Portions of this note are a verbal dictation therefore any spelling and/or grammatical errors are due to the "[employer]" system interpretation.

## 2024-02-07 NOTE — Progress Notes (Signed)
 Pt discharged home c hospice via [facility]. Daughter, [name], made aware of [facility] arrival. No immediate questions or concerns at this time.

## 2024-02-08 DIAGNOSIS — K5900 Constipation, unspecified: Secondary | ICD-10-CM | POA: Diagnosis not present

## 2024-02-08 DIAGNOSIS — I10 Essential (primary) hypertension: Secondary | ICD-10-CM | POA: Diagnosis not present

## 2024-02-08 DIAGNOSIS — F0390 Unspecified dementia without behavioral disturbance: Secondary | ICD-10-CM | POA: Diagnosis not present

## 2024-02-08 DIAGNOSIS — Z7189 Other specified counseling: Secondary | ICD-10-CM | POA: Diagnosis not present

## 2024-02-08 DIAGNOSIS — J101 Influenza due to other identified influenza virus with other respiratory manifestations: Secondary | ICD-10-CM | POA: Diagnosis not present

## 2024-02-08 DIAGNOSIS — E1165 Type 2 diabetes mellitus with hyperglycemia: Secondary | ICD-10-CM | POA: Diagnosis not present

## 2024-02-08 DIAGNOSIS — Z09 Encounter for follow-up examination after completed treatment for conditions other than malignant neoplasm: Secondary | ICD-10-CM | POA: Diagnosis not present

## 2024-02-08 DIAGNOSIS — N189 Chronic kidney disease, unspecified: Secondary | ICD-10-CM | POA: Diagnosis not present

## 2024-02-09 ENCOUNTER — Other Ambulatory Visit (HOSPITAL_COMMUNITY): Payer: Self-pay

## 2024-02-10 DIAGNOSIS — H4089 Other specified glaucoma: Secondary | ICD-10-CM | POA: Diagnosis not present

## 2024-02-10 DIAGNOSIS — J45909 Unspecified asthma, uncomplicated: Secondary | ICD-10-CM | POA: Diagnosis not present

## 2024-02-10 DIAGNOSIS — I509 Heart failure, unspecified: Secondary | ICD-10-CM | POA: Diagnosis not present

## 2024-02-10 DIAGNOSIS — F329 Major depressive disorder, single episode, unspecified: Secondary | ICD-10-CM | POA: Diagnosis not present

## 2024-02-10 DIAGNOSIS — E039 Hypothyroidism, unspecified: Secondary | ICD-10-CM | POA: Diagnosis not present

## 2024-02-10 DIAGNOSIS — I10 Essential (primary) hypertension: Secondary | ICD-10-CM | POA: Diagnosis not present

## 2024-02-10 DIAGNOSIS — E1121 Type 2 diabetes mellitus with diabetic nephropathy: Secondary | ICD-10-CM | POA: Diagnosis not present

## 2024-02-10 DIAGNOSIS — N184 Chronic kidney disease, stage 4 (severe): Secondary | ICD-10-CM | POA: Diagnosis not present

## 2024-02-10 DIAGNOSIS — E785 Hyperlipidemia, unspecified: Secondary | ICD-10-CM | POA: Diagnosis not present

## 2024-02-10 DIAGNOSIS — I679 Cerebrovascular disease, unspecified: Secondary | ICD-10-CM | POA: Diagnosis not present

## 2024-02-10 DIAGNOSIS — F03918 Unspecified dementia, unspecified severity, with other behavioral disturbance: Secondary | ICD-10-CM | POA: Diagnosis not present

## 2024-02-10 DIAGNOSIS — I6350 Cerebral infarction due to unspecified occlusion or stenosis of unspecified cerebral artery: Secondary | ICD-10-CM | POA: Diagnosis not present

## 2024-02-11 DIAGNOSIS — I10 Essential (primary) hypertension: Secondary | ICD-10-CM | POA: Diagnosis not present

## 2024-02-11 DIAGNOSIS — E1121 Type 2 diabetes mellitus with diabetic nephropathy: Secondary | ICD-10-CM | POA: Diagnosis not present

## 2024-02-11 DIAGNOSIS — F03918 Unspecified dementia, unspecified severity, with other behavioral disturbance: Secondary | ICD-10-CM | POA: Diagnosis not present

## 2024-02-11 DIAGNOSIS — I6350 Cerebral infarction due to unspecified occlusion or stenosis of unspecified cerebral artery: Secondary | ICD-10-CM | POA: Diagnosis not present

## 2024-02-11 DIAGNOSIS — N184 Chronic kidney disease, stage 4 (severe): Secondary | ICD-10-CM | POA: Diagnosis not present

## 2024-02-11 DIAGNOSIS — I679 Cerebrovascular disease, unspecified: Secondary | ICD-10-CM | POA: Diagnosis not present

## 2024-02-12 DIAGNOSIS — F03918 Unspecified dementia, unspecified severity, with other behavioral disturbance: Secondary | ICD-10-CM | POA: Diagnosis not present

## 2024-02-12 DIAGNOSIS — I10 Essential (primary) hypertension: Secondary | ICD-10-CM | POA: Diagnosis not present

## 2024-02-12 DIAGNOSIS — I6350 Cerebral infarction due to unspecified occlusion or stenosis of unspecified cerebral artery: Secondary | ICD-10-CM | POA: Diagnosis not present

## 2024-02-12 DIAGNOSIS — E1121 Type 2 diabetes mellitus with diabetic nephropathy: Secondary | ICD-10-CM | POA: Diagnosis not present

## 2024-02-12 DIAGNOSIS — I679 Cerebrovascular disease, unspecified: Secondary | ICD-10-CM | POA: Diagnosis not present

## 2024-02-12 DIAGNOSIS — N184 Chronic kidney disease, stage 4 (severe): Secondary | ICD-10-CM | POA: Diagnosis not present

## 2024-02-13 DIAGNOSIS — I679 Cerebrovascular disease, unspecified: Secondary | ICD-10-CM | POA: Diagnosis not present

## 2024-02-13 DIAGNOSIS — I6350 Cerebral infarction due to unspecified occlusion or stenosis of unspecified cerebral artery: Secondary | ICD-10-CM | POA: Diagnosis not present

## 2024-02-13 DIAGNOSIS — I10 Essential (primary) hypertension: Secondary | ICD-10-CM | POA: Diagnosis not present

## 2024-02-13 DIAGNOSIS — F03918 Unspecified dementia, unspecified severity, with other behavioral disturbance: Secondary | ICD-10-CM | POA: Diagnosis not present

## 2024-02-13 DIAGNOSIS — N184 Chronic kidney disease, stage 4 (severe): Secondary | ICD-10-CM | POA: Diagnosis not present

## 2024-02-13 DIAGNOSIS — E1121 Type 2 diabetes mellitus with diabetic nephropathy: Secondary | ICD-10-CM | POA: Diagnosis not present

## 2024-02-14 DIAGNOSIS — N184 Chronic kidney disease, stage 4 (severe): Secondary | ICD-10-CM | POA: Diagnosis not present

## 2024-02-14 DIAGNOSIS — I6350 Cerebral infarction due to unspecified occlusion or stenosis of unspecified cerebral artery: Secondary | ICD-10-CM | POA: Diagnosis not present

## 2024-02-14 DIAGNOSIS — I10 Essential (primary) hypertension: Secondary | ICD-10-CM | POA: Diagnosis not present

## 2024-02-14 DIAGNOSIS — F03918 Unspecified dementia, unspecified severity, with other behavioral disturbance: Secondary | ICD-10-CM | POA: Diagnosis not present

## 2024-02-14 DIAGNOSIS — E1121 Type 2 diabetes mellitus with diabetic nephropathy: Secondary | ICD-10-CM | POA: Diagnosis not present

## 2024-02-14 DIAGNOSIS — I679 Cerebrovascular disease, unspecified: Secondary | ICD-10-CM | POA: Diagnosis not present

## 2024-02-15 DIAGNOSIS — I6350 Cerebral infarction due to unspecified occlusion or stenosis of unspecified cerebral artery: Secondary | ICD-10-CM | POA: Diagnosis not present

## 2024-02-15 DIAGNOSIS — E1121 Type 2 diabetes mellitus with diabetic nephropathy: Secondary | ICD-10-CM | POA: Diagnosis not present

## 2024-02-15 DIAGNOSIS — I10 Essential (primary) hypertension: Secondary | ICD-10-CM | POA: Diagnosis not present

## 2024-02-15 DIAGNOSIS — N184 Chronic kidney disease, stage 4 (severe): Secondary | ICD-10-CM | POA: Diagnosis not present

## 2024-02-15 DIAGNOSIS — I679 Cerebrovascular disease, unspecified: Secondary | ICD-10-CM | POA: Diagnosis not present

## 2024-02-15 DIAGNOSIS — F03918 Unspecified dementia, unspecified severity, with other behavioral disturbance: Secondary | ICD-10-CM | POA: Diagnosis not present

## 2024-02-16 DIAGNOSIS — I10 Essential (primary) hypertension: Secondary | ICD-10-CM | POA: Diagnosis not present

## 2024-02-16 DIAGNOSIS — E1121 Type 2 diabetes mellitus with diabetic nephropathy: Secondary | ICD-10-CM | POA: Diagnosis not present

## 2024-02-16 DIAGNOSIS — N184 Chronic kidney disease, stage 4 (severe): Secondary | ICD-10-CM | POA: Diagnosis not present

## 2024-02-16 DIAGNOSIS — I6350 Cerebral infarction due to unspecified occlusion or stenosis of unspecified cerebral artery: Secondary | ICD-10-CM | POA: Diagnosis not present

## 2024-02-16 DIAGNOSIS — F03918 Unspecified dementia, unspecified severity, with other behavioral disturbance: Secondary | ICD-10-CM | POA: Diagnosis not present

## 2024-02-16 DIAGNOSIS — I679 Cerebrovascular disease, unspecified: Secondary | ICD-10-CM | POA: Diagnosis not present

## 2024-02-21 DIAGNOSIS — E1121 Type 2 diabetes mellitus with diabetic nephropathy: Secondary | ICD-10-CM | POA: Diagnosis not present

## 2024-02-21 DIAGNOSIS — I679 Cerebrovascular disease, unspecified: Secondary | ICD-10-CM | POA: Diagnosis not present

## 2024-02-21 DIAGNOSIS — F03918 Unspecified dementia, unspecified severity, with other behavioral disturbance: Secondary | ICD-10-CM | POA: Diagnosis not present

## 2024-02-21 DIAGNOSIS — I6350 Cerebral infarction due to unspecified occlusion or stenosis of unspecified cerebral artery: Secondary | ICD-10-CM | POA: Diagnosis not present

## 2024-02-21 DIAGNOSIS — I10 Essential (primary) hypertension: Secondary | ICD-10-CM | POA: Diagnosis not present

## 2024-02-21 DIAGNOSIS — N184 Chronic kidney disease, stage 4 (severe): Secondary | ICD-10-CM | POA: Diagnosis not present

## 2024-02-22 DIAGNOSIS — E1121 Type 2 diabetes mellitus with diabetic nephropathy: Secondary | ICD-10-CM | POA: Diagnosis not present

## 2024-02-22 DIAGNOSIS — I10 Essential (primary) hypertension: Secondary | ICD-10-CM | POA: Diagnosis not present

## 2024-02-22 DIAGNOSIS — E785 Hyperlipidemia, unspecified: Secondary | ICD-10-CM | POA: Diagnosis not present

## 2024-02-22 DIAGNOSIS — F03918 Unspecified dementia, unspecified severity, with other behavioral disturbance: Secondary | ICD-10-CM | POA: Diagnosis not present

## 2024-02-22 DIAGNOSIS — F329 Major depressive disorder, single episode, unspecified: Secondary | ICD-10-CM | POA: Diagnosis not present

## 2024-02-22 DIAGNOSIS — I6350 Cerebral infarction due to unspecified occlusion or stenosis of unspecified cerebral artery: Secondary | ICD-10-CM | POA: Diagnosis not present

## 2024-02-22 DIAGNOSIS — N184 Chronic kidney disease, stage 4 (severe): Secondary | ICD-10-CM | POA: Diagnosis not present

## 2024-02-22 DIAGNOSIS — J45909 Unspecified asthma, uncomplicated: Secondary | ICD-10-CM | POA: Diagnosis not present

## 2024-02-22 DIAGNOSIS — I509 Heart failure, unspecified: Secondary | ICD-10-CM | POA: Diagnosis not present

## 2024-02-22 DIAGNOSIS — I679 Cerebrovascular disease, unspecified: Secondary | ICD-10-CM | POA: Diagnosis not present

## 2024-02-27 DIAGNOSIS — F03918 Unspecified dementia, unspecified severity, with other behavioral disturbance: Secondary | ICD-10-CM | POA: Diagnosis not present

## 2024-02-27 DIAGNOSIS — I10 Essential (primary) hypertension: Secondary | ICD-10-CM | POA: Diagnosis not present

## 2024-02-27 DIAGNOSIS — I679 Cerebrovascular disease, unspecified: Secondary | ICD-10-CM | POA: Diagnosis not present

## 2024-02-27 DIAGNOSIS — N184 Chronic kidney disease, stage 4 (severe): Secondary | ICD-10-CM | POA: Diagnosis not present

## 2024-02-27 DIAGNOSIS — I6350 Cerebral infarction due to unspecified occlusion or stenosis of unspecified cerebral artery: Secondary | ICD-10-CM | POA: Diagnosis not present

## 2024-02-27 DIAGNOSIS — E1121 Type 2 diabetes mellitus with diabetic nephropathy: Secondary | ICD-10-CM | POA: Diagnosis not present

## 2024-02-28 DIAGNOSIS — F03918 Unspecified dementia, unspecified severity, with other behavioral disturbance: Secondary | ICD-10-CM | POA: Diagnosis not present

## 2024-02-28 DIAGNOSIS — J45909 Unspecified asthma, uncomplicated: Secondary | ICD-10-CM | POA: Diagnosis not present

## 2024-02-28 DIAGNOSIS — I679 Cerebrovascular disease, unspecified: Secondary | ICD-10-CM | POA: Diagnosis not present

## 2024-02-28 DIAGNOSIS — E1121 Type 2 diabetes mellitus with diabetic nephropathy: Secondary | ICD-10-CM | POA: Diagnosis not present

## 2024-02-28 DIAGNOSIS — F329 Major depressive disorder, single episode, unspecified: Secondary | ICD-10-CM | POA: Diagnosis not present

## 2024-02-28 DIAGNOSIS — E785 Hyperlipidemia, unspecified: Secondary | ICD-10-CM | POA: Diagnosis not present

## 2024-02-28 DIAGNOSIS — H4089 Other specified glaucoma: Secondary | ICD-10-CM | POA: Diagnosis not present

## 2024-02-28 DIAGNOSIS — N184 Chronic kidney disease, stage 4 (severe): Secondary | ICD-10-CM | POA: Diagnosis not present

## 2024-02-28 DIAGNOSIS — E039 Hypothyroidism, unspecified: Secondary | ICD-10-CM | POA: Diagnosis not present

## 2024-02-28 DIAGNOSIS — I509 Heart failure, unspecified: Secondary | ICD-10-CM | POA: Diagnosis not present

## 2024-02-28 DIAGNOSIS — I10 Essential (primary) hypertension: Secondary | ICD-10-CM | POA: Diagnosis not present

## 2024-02-28 DIAGNOSIS — I6350 Cerebral infarction due to unspecified occlusion or stenosis of unspecified cerebral artery: Secondary | ICD-10-CM | POA: Diagnosis not present

## 2024-02-29 DIAGNOSIS — E1121 Type 2 diabetes mellitus with diabetic nephropathy: Secondary | ICD-10-CM | POA: Diagnosis not present

## 2024-02-29 DIAGNOSIS — F03918 Unspecified dementia, unspecified severity, with other behavioral disturbance: Secondary | ICD-10-CM | POA: Diagnosis not present

## 2024-02-29 DIAGNOSIS — I6350 Cerebral infarction due to unspecified occlusion or stenosis of unspecified cerebral artery: Secondary | ICD-10-CM | POA: Diagnosis not present

## 2024-02-29 DIAGNOSIS — I10 Essential (primary) hypertension: Secondary | ICD-10-CM | POA: Diagnosis not present

## 2024-02-29 DIAGNOSIS — I679 Cerebrovascular disease, unspecified: Secondary | ICD-10-CM | POA: Diagnosis not present

## 2024-02-29 DIAGNOSIS — N184 Chronic kidney disease, stage 4 (severe): Secondary | ICD-10-CM | POA: Diagnosis not present

## 2024-03-07 DIAGNOSIS — I10 Essential (primary) hypertension: Secondary | ICD-10-CM | POA: Diagnosis not present

## 2024-03-07 DIAGNOSIS — F03918 Unspecified dementia, unspecified severity, with other behavioral disturbance: Secondary | ICD-10-CM | POA: Diagnosis not present

## 2024-03-07 DIAGNOSIS — N184 Chronic kidney disease, stage 4 (severe): Secondary | ICD-10-CM | POA: Diagnosis not present

## 2024-03-07 DIAGNOSIS — E1121 Type 2 diabetes mellitus with diabetic nephropathy: Secondary | ICD-10-CM | POA: Diagnosis not present

## 2024-03-07 DIAGNOSIS — I679 Cerebrovascular disease, unspecified: Secondary | ICD-10-CM | POA: Diagnosis not present

## 2024-03-07 DIAGNOSIS — I6350 Cerebral infarction due to unspecified occlusion or stenosis of unspecified cerebral artery: Secondary | ICD-10-CM | POA: Diagnosis not present

## 2024-03-08 DIAGNOSIS — I679 Cerebrovascular disease, unspecified: Secondary | ICD-10-CM | POA: Diagnosis not present

## 2024-03-08 DIAGNOSIS — E1121 Type 2 diabetes mellitus with diabetic nephropathy: Secondary | ICD-10-CM | POA: Diagnosis not present

## 2024-03-08 DIAGNOSIS — F03918 Unspecified dementia, unspecified severity, with other behavioral disturbance: Secondary | ICD-10-CM | POA: Diagnosis not present

## 2024-03-08 DIAGNOSIS — N184 Chronic kidney disease, stage 4 (severe): Secondary | ICD-10-CM | POA: Diagnosis not present

## 2024-03-08 DIAGNOSIS — I10 Essential (primary) hypertension: Secondary | ICD-10-CM | POA: Diagnosis not present

## 2024-03-08 DIAGNOSIS — I6350 Cerebral infarction due to unspecified occlusion or stenosis of unspecified cerebral artery: Secondary | ICD-10-CM | POA: Diagnosis not present

## 2024-03-09 DIAGNOSIS — I679 Cerebrovascular disease, unspecified: Secondary | ICD-10-CM | POA: Diagnosis not present

## 2024-03-09 DIAGNOSIS — F03918 Unspecified dementia, unspecified severity, with other behavioral disturbance: Secondary | ICD-10-CM | POA: Diagnosis not present

## 2024-03-09 DIAGNOSIS — I10 Essential (primary) hypertension: Secondary | ICD-10-CM | POA: Diagnosis not present

## 2024-03-09 DIAGNOSIS — E1121 Type 2 diabetes mellitus with diabetic nephropathy: Secondary | ICD-10-CM | POA: Diagnosis not present

## 2024-03-09 DIAGNOSIS — I6350 Cerebral infarction due to unspecified occlusion or stenosis of unspecified cerebral artery: Secondary | ICD-10-CM | POA: Diagnosis not present

## 2024-03-09 DIAGNOSIS — N184 Chronic kidney disease, stage 4 (severe): Secondary | ICD-10-CM | POA: Diagnosis not present

## 2024-03-14 DIAGNOSIS — I6350 Cerebral infarction due to unspecified occlusion or stenosis of unspecified cerebral artery: Secondary | ICD-10-CM | POA: Diagnosis not present

## 2024-03-14 DIAGNOSIS — I10 Essential (primary) hypertension: Secondary | ICD-10-CM | POA: Diagnosis not present

## 2024-03-14 DIAGNOSIS — F03918 Unspecified dementia, unspecified severity, with other behavioral disturbance: Secondary | ICD-10-CM | POA: Diagnosis not present

## 2024-03-14 DIAGNOSIS — N184 Chronic kidney disease, stage 4 (severe): Secondary | ICD-10-CM | POA: Diagnosis not present

## 2024-03-14 DIAGNOSIS — E1121 Type 2 diabetes mellitus with diabetic nephropathy: Secondary | ICD-10-CM | POA: Diagnosis not present

## 2024-03-14 DIAGNOSIS — I679 Cerebrovascular disease, unspecified: Secondary | ICD-10-CM | POA: Diagnosis not present

## 2024-03-15 DIAGNOSIS — E1121 Type 2 diabetes mellitus with diabetic nephropathy: Secondary | ICD-10-CM | POA: Diagnosis not present

## 2024-03-15 DIAGNOSIS — N184 Chronic kidney disease, stage 4 (severe): Secondary | ICD-10-CM | POA: Diagnosis not present

## 2024-03-15 DIAGNOSIS — I10 Essential (primary) hypertension: Secondary | ICD-10-CM | POA: Diagnosis not present

## 2024-03-15 DIAGNOSIS — F03918 Unspecified dementia, unspecified severity, with other behavioral disturbance: Secondary | ICD-10-CM | POA: Diagnosis not present

## 2024-03-15 DIAGNOSIS — I679 Cerebrovascular disease, unspecified: Secondary | ICD-10-CM | POA: Diagnosis not present

## 2024-03-15 DIAGNOSIS — I6350 Cerebral infarction due to unspecified occlusion or stenosis of unspecified cerebral artery: Secondary | ICD-10-CM | POA: Diagnosis not present

## 2024-03-21 DIAGNOSIS — I679 Cerebrovascular disease, unspecified: Secondary | ICD-10-CM | POA: Diagnosis not present

## 2024-03-21 DIAGNOSIS — E1159 Type 2 diabetes mellitus with other circulatory complications: Secondary | ICD-10-CM | POA: Diagnosis not present

## 2024-03-21 DIAGNOSIS — Z515 Encounter for palliative care: Secondary | ICD-10-CM | POA: Diagnosis not present

## 2024-03-21 DIAGNOSIS — E1165 Type 2 diabetes mellitus with hyperglycemia: Secondary | ICD-10-CM | POA: Diagnosis not present

## 2024-03-21 DIAGNOSIS — E1121 Type 2 diabetes mellitus with diabetic nephropathy: Secondary | ICD-10-CM | POA: Diagnosis not present

## 2024-03-21 DIAGNOSIS — N184 Chronic kidney disease, stage 4 (severe): Secondary | ICD-10-CM | POA: Diagnosis not present

## 2024-03-21 DIAGNOSIS — I10 Essential (primary) hypertension: Secondary | ICD-10-CM | POA: Diagnosis not present

## 2024-03-21 DIAGNOSIS — E1122 Type 2 diabetes mellitus with diabetic chronic kidney disease: Secondary | ICD-10-CM | POA: Diagnosis not present

## 2024-03-21 DIAGNOSIS — F03918 Unspecified dementia, unspecified severity, with other behavioral disturbance: Secondary | ICD-10-CM | POA: Diagnosis not present

## 2024-03-21 DIAGNOSIS — E1143 Type 2 diabetes mellitus with diabetic autonomic (poly)neuropathy: Secondary | ICD-10-CM | POA: Diagnosis not present

## 2024-03-21 DIAGNOSIS — I6350 Cerebral infarction due to unspecified occlusion or stenosis of unspecified cerebral artery: Secondary | ICD-10-CM | POA: Diagnosis not present

## 2024-03-21 DIAGNOSIS — I129 Hypertensive chronic kidney disease with stage 1 through stage 4 chronic kidney disease, or unspecified chronic kidney disease: Secondary | ICD-10-CM | POA: Diagnosis not present

## 2024-03-22 DIAGNOSIS — F03918 Unspecified dementia, unspecified severity, with other behavioral disturbance: Secondary | ICD-10-CM | POA: Diagnosis not present

## 2024-03-22 DIAGNOSIS — I679 Cerebrovascular disease, unspecified: Secondary | ICD-10-CM | POA: Diagnosis not present

## 2024-03-22 DIAGNOSIS — I10 Essential (primary) hypertension: Secondary | ICD-10-CM | POA: Diagnosis not present

## 2024-03-22 DIAGNOSIS — E1121 Type 2 diabetes mellitus with diabetic nephropathy: Secondary | ICD-10-CM | POA: Diagnosis not present

## 2024-03-22 DIAGNOSIS — N184 Chronic kidney disease, stage 4 (severe): Secondary | ICD-10-CM | POA: Diagnosis not present

## 2024-03-22 DIAGNOSIS — I6350 Cerebral infarction due to unspecified occlusion or stenosis of unspecified cerebral artery: Secondary | ICD-10-CM | POA: Diagnosis not present

## 2024-03-23 ENCOUNTER — Ambulatory Visit: Payer: Medicare Other | Admitting: Neurology

## 2024-03-28 DIAGNOSIS — E1121 Type 2 diabetes mellitus with diabetic nephropathy: Secondary | ICD-10-CM | POA: Diagnosis not present

## 2024-03-28 DIAGNOSIS — F03918 Unspecified dementia, unspecified severity, with other behavioral disturbance: Secondary | ICD-10-CM | POA: Diagnosis not present

## 2024-03-28 DIAGNOSIS — I679 Cerebrovascular disease, unspecified: Secondary | ICD-10-CM | POA: Diagnosis not present

## 2024-03-28 DIAGNOSIS — N184 Chronic kidney disease, stage 4 (severe): Secondary | ICD-10-CM | POA: Diagnosis not present

## 2024-03-28 DIAGNOSIS — I10 Essential (primary) hypertension: Secondary | ICD-10-CM | POA: Diagnosis not present

## 2024-03-28 DIAGNOSIS — I6350 Cerebral infarction due to unspecified occlusion or stenosis of unspecified cerebral artery: Secondary | ICD-10-CM | POA: Diagnosis not present

## 2024-03-29 DIAGNOSIS — E1121 Type 2 diabetes mellitus with diabetic nephropathy: Secondary | ICD-10-CM | POA: Diagnosis not present

## 2024-03-29 DIAGNOSIS — I509 Heart failure, unspecified: Secondary | ICD-10-CM | POA: Diagnosis not present

## 2024-03-29 DIAGNOSIS — E039 Hypothyroidism, unspecified: Secondary | ICD-10-CM | POA: Diagnosis not present

## 2024-03-29 DIAGNOSIS — I10 Essential (primary) hypertension: Secondary | ICD-10-CM | POA: Diagnosis not present

## 2024-03-29 DIAGNOSIS — F329 Major depressive disorder, single episode, unspecified: Secondary | ICD-10-CM | POA: Diagnosis not present

## 2024-03-29 DIAGNOSIS — I679 Cerebrovascular disease, unspecified: Secondary | ICD-10-CM | POA: Diagnosis not present

## 2024-03-29 DIAGNOSIS — H4089 Other specified glaucoma: Secondary | ICD-10-CM | POA: Diagnosis not present

## 2024-03-29 DIAGNOSIS — N184 Chronic kidney disease, stage 4 (severe): Secondary | ICD-10-CM | POA: Diagnosis not present

## 2024-03-29 DIAGNOSIS — F03918 Unspecified dementia, unspecified severity, with other behavioral disturbance: Secondary | ICD-10-CM | POA: Diagnosis not present

## 2024-03-29 DIAGNOSIS — E785 Hyperlipidemia, unspecified: Secondary | ICD-10-CM | POA: Diagnosis not present

## 2024-03-29 DIAGNOSIS — I6350 Cerebral infarction due to unspecified occlusion or stenosis of unspecified cerebral artery: Secondary | ICD-10-CM | POA: Diagnosis not present

## 2024-03-29 DIAGNOSIS — J45909 Unspecified asthma, uncomplicated: Secondary | ICD-10-CM | POA: Diagnosis not present

## 2024-04-04 DIAGNOSIS — E1121 Type 2 diabetes mellitus with diabetic nephropathy: Secondary | ICD-10-CM | POA: Diagnosis not present

## 2024-04-04 DIAGNOSIS — I6350 Cerebral infarction due to unspecified occlusion or stenosis of unspecified cerebral artery: Secondary | ICD-10-CM | POA: Diagnosis not present

## 2024-04-04 DIAGNOSIS — N184 Chronic kidney disease, stage 4 (severe): Secondary | ICD-10-CM | POA: Diagnosis not present

## 2024-04-04 DIAGNOSIS — I10 Essential (primary) hypertension: Secondary | ICD-10-CM | POA: Diagnosis not present

## 2024-04-04 DIAGNOSIS — I679 Cerebrovascular disease, unspecified: Secondary | ICD-10-CM | POA: Diagnosis not present

## 2024-04-04 DIAGNOSIS — F03918 Unspecified dementia, unspecified severity, with other behavioral disturbance: Secondary | ICD-10-CM | POA: Diagnosis not present

## 2024-04-09 DIAGNOSIS — I6350 Cerebral infarction due to unspecified occlusion or stenosis of unspecified cerebral artery: Secondary | ICD-10-CM | POA: Diagnosis not present

## 2024-04-09 DIAGNOSIS — I679 Cerebrovascular disease, unspecified: Secondary | ICD-10-CM | POA: Diagnosis not present

## 2024-04-09 DIAGNOSIS — I10 Essential (primary) hypertension: Secondary | ICD-10-CM | POA: Diagnosis not present

## 2024-04-09 DIAGNOSIS — E1121 Type 2 diabetes mellitus with diabetic nephropathy: Secondary | ICD-10-CM | POA: Diagnosis not present

## 2024-04-09 DIAGNOSIS — F03918 Unspecified dementia, unspecified severity, with other behavioral disturbance: Secondary | ICD-10-CM | POA: Diagnosis not present

## 2024-04-09 DIAGNOSIS — N184 Chronic kidney disease, stage 4 (severe): Secondary | ICD-10-CM | POA: Diagnosis not present

## 2024-04-10 DIAGNOSIS — I10 Essential (primary) hypertension: Secondary | ICD-10-CM | POA: Diagnosis not present

## 2024-04-10 DIAGNOSIS — N184 Chronic kidney disease, stage 4 (severe): Secondary | ICD-10-CM | POA: Diagnosis not present

## 2024-04-10 DIAGNOSIS — E1121 Type 2 diabetes mellitus with diabetic nephropathy: Secondary | ICD-10-CM | POA: Diagnosis not present

## 2024-04-10 DIAGNOSIS — F03918 Unspecified dementia, unspecified severity, with other behavioral disturbance: Secondary | ICD-10-CM | POA: Diagnosis not present

## 2024-04-10 DIAGNOSIS — I6350 Cerebral infarction due to unspecified occlusion or stenosis of unspecified cerebral artery: Secondary | ICD-10-CM | POA: Diagnosis not present

## 2024-04-10 DIAGNOSIS — I679 Cerebrovascular disease, unspecified: Secondary | ICD-10-CM | POA: Diagnosis not present

## 2024-04-12 DIAGNOSIS — E1121 Type 2 diabetes mellitus with diabetic nephropathy: Secondary | ICD-10-CM | POA: Diagnosis not present

## 2024-04-12 DIAGNOSIS — I679 Cerebrovascular disease, unspecified: Secondary | ICD-10-CM | POA: Diagnosis not present

## 2024-04-12 DIAGNOSIS — N184 Chronic kidney disease, stage 4 (severe): Secondary | ICD-10-CM | POA: Diagnosis not present

## 2024-04-12 DIAGNOSIS — F03918 Unspecified dementia, unspecified severity, with other behavioral disturbance: Secondary | ICD-10-CM | POA: Diagnosis not present

## 2024-04-12 DIAGNOSIS — I6350 Cerebral infarction due to unspecified occlusion or stenosis of unspecified cerebral artery: Secondary | ICD-10-CM | POA: Diagnosis not present

## 2024-04-12 DIAGNOSIS — I10 Essential (primary) hypertension: Secondary | ICD-10-CM | POA: Diagnosis not present

## 2024-04-17 DIAGNOSIS — I10 Essential (primary) hypertension: Secondary | ICD-10-CM | POA: Diagnosis not present

## 2024-04-17 DIAGNOSIS — N184 Chronic kidney disease, stage 4 (severe): Secondary | ICD-10-CM | POA: Diagnosis not present

## 2024-04-17 DIAGNOSIS — I6350 Cerebral infarction due to unspecified occlusion or stenosis of unspecified cerebral artery: Secondary | ICD-10-CM | POA: Diagnosis not present

## 2024-04-17 DIAGNOSIS — I679 Cerebrovascular disease, unspecified: Secondary | ICD-10-CM | POA: Diagnosis not present

## 2024-04-17 DIAGNOSIS — E1121 Type 2 diabetes mellitus with diabetic nephropathy: Secondary | ICD-10-CM | POA: Diagnosis not present

## 2024-04-17 DIAGNOSIS — F03918 Unspecified dementia, unspecified severity, with other behavioral disturbance: Secondary | ICD-10-CM | POA: Diagnosis not present

## 2024-04-19 DIAGNOSIS — N184 Chronic kidney disease, stage 4 (severe): Secondary | ICD-10-CM | POA: Diagnosis not present

## 2024-04-19 DIAGNOSIS — I6350 Cerebral infarction due to unspecified occlusion or stenosis of unspecified cerebral artery: Secondary | ICD-10-CM | POA: Diagnosis not present

## 2024-04-19 DIAGNOSIS — E1121 Type 2 diabetes mellitus with diabetic nephropathy: Secondary | ICD-10-CM | POA: Diagnosis not present

## 2024-04-19 DIAGNOSIS — F03918 Unspecified dementia, unspecified severity, with other behavioral disturbance: Secondary | ICD-10-CM | POA: Diagnosis not present

## 2024-04-19 DIAGNOSIS — I679 Cerebrovascular disease, unspecified: Secondary | ICD-10-CM | POA: Diagnosis not present

## 2024-04-19 DIAGNOSIS — I10 Essential (primary) hypertension: Secondary | ICD-10-CM | POA: Diagnosis not present

## 2024-04-24 DIAGNOSIS — I10 Essential (primary) hypertension: Secondary | ICD-10-CM | POA: Diagnosis not present

## 2024-04-24 DIAGNOSIS — N184 Chronic kidney disease, stage 4 (severe): Secondary | ICD-10-CM | POA: Diagnosis not present

## 2024-04-24 DIAGNOSIS — I6350 Cerebral infarction due to unspecified occlusion or stenosis of unspecified cerebral artery: Secondary | ICD-10-CM | POA: Diagnosis not present

## 2024-04-24 DIAGNOSIS — I679 Cerebrovascular disease, unspecified: Secondary | ICD-10-CM | POA: Diagnosis not present

## 2024-04-24 DIAGNOSIS — E1121 Type 2 diabetes mellitus with diabetic nephropathy: Secondary | ICD-10-CM | POA: Diagnosis not present

## 2024-04-24 DIAGNOSIS — F03918 Unspecified dementia, unspecified severity, with other behavioral disturbance: Secondary | ICD-10-CM | POA: Diagnosis not present

## 2024-04-26 DIAGNOSIS — I10 Essential (primary) hypertension: Secondary | ICD-10-CM | POA: Diagnosis not present

## 2024-04-26 DIAGNOSIS — F03918 Unspecified dementia, unspecified severity, with other behavioral disturbance: Secondary | ICD-10-CM | POA: Diagnosis not present

## 2024-04-26 DIAGNOSIS — I6350 Cerebral infarction due to unspecified occlusion or stenosis of unspecified cerebral artery: Secondary | ICD-10-CM | POA: Diagnosis not present

## 2024-04-26 DIAGNOSIS — E1121 Type 2 diabetes mellitus with diabetic nephropathy: Secondary | ICD-10-CM | POA: Diagnosis not present

## 2024-04-26 DIAGNOSIS — N184 Chronic kidney disease, stage 4 (severe): Secondary | ICD-10-CM | POA: Diagnosis not present

## 2024-04-26 DIAGNOSIS — I679 Cerebrovascular disease, unspecified: Secondary | ICD-10-CM | POA: Diagnosis not present

## 2024-04-29 DIAGNOSIS — J45909 Unspecified asthma, uncomplicated: Secondary | ICD-10-CM | POA: Diagnosis not present

## 2024-04-29 DIAGNOSIS — N184 Chronic kidney disease, stage 4 (severe): Secondary | ICD-10-CM | POA: Diagnosis not present

## 2024-04-29 DIAGNOSIS — I509 Heart failure, unspecified: Secondary | ICD-10-CM | POA: Diagnosis not present

## 2024-04-29 DIAGNOSIS — F329 Major depressive disorder, single episode, unspecified: Secondary | ICD-10-CM | POA: Diagnosis not present

## 2024-04-29 DIAGNOSIS — E039 Hypothyroidism, unspecified: Secondary | ICD-10-CM | POA: Diagnosis not present

## 2024-04-29 DIAGNOSIS — I10 Essential (primary) hypertension: Secondary | ICD-10-CM | POA: Diagnosis not present

## 2024-04-29 DIAGNOSIS — F03918 Unspecified dementia, unspecified severity, with other behavioral disturbance: Secondary | ICD-10-CM | POA: Diagnosis not present

## 2024-04-29 DIAGNOSIS — E1121 Type 2 diabetes mellitus with diabetic nephropathy: Secondary | ICD-10-CM | POA: Diagnosis not present

## 2024-04-29 DIAGNOSIS — H4089 Other specified glaucoma: Secondary | ICD-10-CM | POA: Diagnosis not present

## 2024-04-29 DIAGNOSIS — I6350 Cerebral infarction due to unspecified occlusion or stenosis of unspecified cerebral artery: Secondary | ICD-10-CM | POA: Diagnosis not present

## 2024-04-29 DIAGNOSIS — I679 Cerebrovascular disease, unspecified: Secondary | ICD-10-CM | POA: Diagnosis not present

## 2024-04-29 DIAGNOSIS — E785 Hyperlipidemia, unspecified: Secondary | ICD-10-CM | POA: Diagnosis not present

## 2024-05-01 DIAGNOSIS — N184 Chronic kidney disease, stage 4 (severe): Secondary | ICD-10-CM | POA: Diagnosis not present

## 2024-05-01 DIAGNOSIS — I679 Cerebrovascular disease, unspecified: Secondary | ICD-10-CM | POA: Diagnosis not present

## 2024-05-01 DIAGNOSIS — F03918 Unspecified dementia, unspecified severity, with other behavioral disturbance: Secondary | ICD-10-CM | POA: Diagnosis not present

## 2024-05-01 DIAGNOSIS — I6350 Cerebral infarction due to unspecified occlusion or stenosis of unspecified cerebral artery: Secondary | ICD-10-CM | POA: Diagnosis not present

## 2024-05-01 DIAGNOSIS — I10 Essential (primary) hypertension: Secondary | ICD-10-CM | POA: Diagnosis not present

## 2024-05-01 DIAGNOSIS — E1121 Type 2 diabetes mellitus with diabetic nephropathy: Secondary | ICD-10-CM | POA: Diagnosis not present

## 2024-05-08 DIAGNOSIS — F03918 Unspecified dementia, unspecified severity, with other behavioral disturbance: Secondary | ICD-10-CM | POA: Diagnosis not present

## 2024-05-08 DIAGNOSIS — I6350 Cerebral infarction due to unspecified occlusion or stenosis of unspecified cerebral artery: Secondary | ICD-10-CM | POA: Diagnosis not present

## 2024-05-08 DIAGNOSIS — I679 Cerebrovascular disease, unspecified: Secondary | ICD-10-CM | POA: Diagnosis not present

## 2024-05-08 DIAGNOSIS — N184 Chronic kidney disease, stage 4 (severe): Secondary | ICD-10-CM | POA: Diagnosis not present

## 2024-05-08 DIAGNOSIS — I10 Essential (primary) hypertension: Secondary | ICD-10-CM | POA: Diagnosis not present

## 2024-05-08 DIAGNOSIS — E1121 Type 2 diabetes mellitus with diabetic nephropathy: Secondary | ICD-10-CM | POA: Diagnosis not present

## 2024-05-10 DIAGNOSIS — N184 Chronic kidney disease, stage 4 (severe): Secondary | ICD-10-CM | POA: Diagnosis not present

## 2024-05-10 DIAGNOSIS — I679 Cerebrovascular disease, unspecified: Secondary | ICD-10-CM | POA: Diagnosis not present

## 2024-05-10 DIAGNOSIS — I6350 Cerebral infarction due to unspecified occlusion or stenosis of unspecified cerebral artery: Secondary | ICD-10-CM | POA: Diagnosis not present

## 2024-05-10 DIAGNOSIS — E1121 Type 2 diabetes mellitus with diabetic nephropathy: Secondary | ICD-10-CM | POA: Diagnosis not present

## 2024-05-10 DIAGNOSIS — F03918 Unspecified dementia, unspecified severity, with other behavioral disturbance: Secondary | ICD-10-CM | POA: Diagnosis not present

## 2024-05-10 DIAGNOSIS — I10 Essential (primary) hypertension: Secondary | ICD-10-CM | POA: Diagnosis not present

## 2024-05-15 DIAGNOSIS — I10 Essential (primary) hypertension: Secondary | ICD-10-CM | POA: Diagnosis not present

## 2024-05-15 DIAGNOSIS — I6350 Cerebral infarction due to unspecified occlusion or stenosis of unspecified cerebral artery: Secondary | ICD-10-CM | POA: Diagnosis not present

## 2024-05-15 DIAGNOSIS — I679 Cerebrovascular disease, unspecified: Secondary | ICD-10-CM | POA: Diagnosis not present

## 2024-05-15 DIAGNOSIS — F03918 Unspecified dementia, unspecified severity, with other behavioral disturbance: Secondary | ICD-10-CM | POA: Diagnosis not present

## 2024-05-15 DIAGNOSIS — N184 Chronic kidney disease, stage 4 (severe): Secondary | ICD-10-CM | POA: Diagnosis not present

## 2024-05-15 DIAGNOSIS — E1121 Type 2 diabetes mellitus with diabetic nephropathy: Secondary | ICD-10-CM | POA: Diagnosis not present

## 2024-05-16 DIAGNOSIS — I10 Essential (primary) hypertension: Secondary | ICD-10-CM | POA: Diagnosis not present

## 2024-05-16 DIAGNOSIS — E1121 Type 2 diabetes mellitus with diabetic nephropathy: Secondary | ICD-10-CM | POA: Diagnosis not present

## 2024-05-16 DIAGNOSIS — F03918 Unspecified dementia, unspecified severity, with other behavioral disturbance: Secondary | ICD-10-CM | POA: Diagnosis not present

## 2024-05-16 DIAGNOSIS — I679 Cerebrovascular disease, unspecified: Secondary | ICD-10-CM | POA: Diagnosis not present

## 2024-05-16 DIAGNOSIS — I6350 Cerebral infarction due to unspecified occlusion or stenosis of unspecified cerebral artery: Secondary | ICD-10-CM | POA: Diagnosis not present

## 2024-05-16 DIAGNOSIS — N184 Chronic kidney disease, stage 4 (severe): Secondary | ICD-10-CM | POA: Diagnosis not present

## 2024-05-17 DIAGNOSIS — I679 Cerebrovascular disease, unspecified: Secondary | ICD-10-CM | POA: Diagnosis not present

## 2024-05-17 DIAGNOSIS — I6350 Cerebral infarction due to unspecified occlusion or stenosis of unspecified cerebral artery: Secondary | ICD-10-CM | POA: Diagnosis not present

## 2024-05-17 DIAGNOSIS — E1121 Type 2 diabetes mellitus with diabetic nephropathy: Secondary | ICD-10-CM | POA: Diagnosis not present

## 2024-05-17 DIAGNOSIS — I10 Essential (primary) hypertension: Secondary | ICD-10-CM | POA: Diagnosis not present

## 2024-05-17 DIAGNOSIS — N184 Chronic kidney disease, stage 4 (severe): Secondary | ICD-10-CM | POA: Diagnosis not present

## 2024-05-17 DIAGNOSIS — F03918 Unspecified dementia, unspecified severity, with other behavioral disturbance: Secondary | ICD-10-CM | POA: Diagnosis not present

## 2024-05-21 DIAGNOSIS — E1121 Type 2 diabetes mellitus with diabetic nephropathy: Secondary | ICD-10-CM | POA: Diagnosis not present

## 2024-05-21 DIAGNOSIS — F03918 Unspecified dementia, unspecified severity, with other behavioral disturbance: Secondary | ICD-10-CM | POA: Diagnosis not present

## 2024-05-21 DIAGNOSIS — I10 Essential (primary) hypertension: Secondary | ICD-10-CM | POA: Diagnosis not present

## 2024-05-21 DIAGNOSIS — I6350 Cerebral infarction due to unspecified occlusion or stenosis of unspecified cerebral artery: Secondary | ICD-10-CM | POA: Diagnosis not present

## 2024-05-21 DIAGNOSIS — I679 Cerebrovascular disease, unspecified: Secondary | ICD-10-CM | POA: Diagnosis not present

## 2024-05-21 DIAGNOSIS — N184 Chronic kidney disease, stage 4 (severe): Secondary | ICD-10-CM | POA: Diagnosis not present

## 2024-05-22 DIAGNOSIS — I6350 Cerebral infarction due to unspecified occlusion or stenosis of unspecified cerebral artery: Secondary | ICD-10-CM | POA: Diagnosis not present

## 2024-05-22 DIAGNOSIS — I679 Cerebrovascular disease, unspecified: Secondary | ICD-10-CM | POA: Diagnosis not present

## 2024-05-22 DIAGNOSIS — N184 Chronic kidney disease, stage 4 (severe): Secondary | ICD-10-CM | POA: Diagnosis not present

## 2024-05-22 DIAGNOSIS — I10 Essential (primary) hypertension: Secondary | ICD-10-CM | POA: Diagnosis not present

## 2024-05-22 DIAGNOSIS — F03918 Unspecified dementia, unspecified severity, with other behavioral disturbance: Secondary | ICD-10-CM | POA: Diagnosis not present

## 2024-05-22 DIAGNOSIS — E1121 Type 2 diabetes mellitus with diabetic nephropathy: Secondary | ICD-10-CM | POA: Diagnosis not present

## 2024-05-24 DIAGNOSIS — E1121 Type 2 diabetes mellitus with diabetic nephropathy: Secondary | ICD-10-CM | POA: Diagnosis not present

## 2024-05-24 DIAGNOSIS — I679 Cerebrovascular disease, unspecified: Secondary | ICD-10-CM | POA: Diagnosis not present

## 2024-05-24 DIAGNOSIS — F03918 Unspecified dementia, unspecified severity, with other behavioral disturbance: Secondary | ICD-10-CM | POA: Diagnosis not present

## 2024-05-24 DIAGNOSIS — I6350 Cerebral infarction due to unspecified occlusion or stenosis of unspecified cerebral artery: Secondary | ICD-10-CM | POA: Diagnosis not present

## 2024-05-24 DIAGNOSIS — I10 Essential (primary) hypertension: Secondary | ICD-10-CM | POA: Diagnosis not present

## 2024-05-24 DIAGNOSIS — N184 Chronic kidney disease, stage 4 (severe): Secondary | ICD-10-CM | POA: Diagnosis not present

## 2024-05-29 DIAGNOSIS — I6350 Cerebral infarction due to unspecified occlusion or stenosis of unspecified cerebral artery: Secondary | ICD-10-CM | POA: Diagnosis not present

## 2024-05-29 DIAGNOSIS — F03918 Unspecified dementia, unspecified severity, with other behavioral disturbance: Secondary | ICD-10-CM | POA: Diagnosis not present

## 2024-05-29 DIAGNOSIS — J45909 Unspecified asthma, uncomplicated: Secondary | ICD-10-CM | POA: Diagnosis not present

## 2024-05-29 DIAGNOSIS — H4089 Other specified glaucoma: Secondary | ICD-10-CM | POA: Diagnosis not present

## 2024-05-29 DIAGNOSIS — E785 Hyperlipidemia, unspecified: Secondary | ICD-10-CM | POA: Diagnosis not present

## 2024-05-29 DIAGNOSIS — E039 Hypothyroidism, unspecified: Secondary | ICD-10-CM | POA: Diagnosis not present

## 2024-05-29 DIAGNOSIS — N184 Chronic kidney disease, stage 4 (severe): Secondary | ICD-10-CM | POA: Diagnosis not present

## 2024-05-29 DIAGNOSIS — E1121 Type 2 diabetes mellitus with diabetic nephropathy: Secondary | ICD-10-CM | POA: Diagnosis not present

## 2024-05-29 DIAGNOSIS — I679 Cerebrovascular disease, unspecified: Secondary | ICD-10-CM | POA: Diagnosis not present

## 2024-05-29 DIAGNOSIS — I509 Heart failure, unspecified: Secondary | ICD-10-CM | POA: Diagnosis not present

## 2024-05-29 DIAGNOSIS — I10 Essential (primary) hypertension: Secondary | ICD-10-CM | POA: Diagnosis not present

## 2024-05-29 DIAGNOSIS — F329 Major depressive disorder, single episode, unspecified: Secondary | ICD-10-CM | POA: Diagnosis not present

## 2024-06-02 DIAGNOSIS — E1121 Type 2 diabetes mellitus with diabetic nephropathy: Secondary | ICD-10-CM | POA: Diagnosis not present

## 2024-06-02 DIAGNOSIS — N184 Chronic kidney disease, stage 4 (severe): Secondary | ICD-10-CM | POA: Diagnosis not present

## 2024-06-02 DIAGNOSIS — I6350 Cerebral infarction due to unspecified occlusion or stenosis of unspecified cerebral artery: Secondary | ICD-10-CM | POA: Diagnosis not present

## 2024-06-02 DIAGNOSIS — I10 Essential (primary) hypertension: Secondary | ICD-10-CM | POA: Diagnosis not present

## 2024-06-02 DIAGNOSIS — F03918 Unspecified dementia, unspecified severity, with other behavioral disturbance: Secondary | ICD-10-CM | POA: Diagnosis not present

## 2024-06-02 DIAGNOSIS — I679 Cerebrovascular disease, unspecified: Secondary | ICD-10-CM | POA: Diagnosis not present

## 2024-06-05 DIAGNOSIS — I6350 Cerebral infarction due to unspecified occlusion or stenosis of unspecified cerebral artery: Secondary | ICD-10-CM | POA: Diagnosis not present

## 2024-06-05 DIAGNOSIS — F03918 Unspecified dementia, unspecified severity, with other behavioral disturbance: Secondary | ICD-10-CM | POA: Diagnosis not present

## 2024-06-05 DIAGNOSIS — I10 Essential (primary) hypertension: Secondary | ICD-10-CM | POA: Diagnosis not present

## 2024-06-05 DIAGNOSIS — N184 Chronic kidney disease, stage 4 (severe): Secondary | ICD-10-CM | POA: Diagnosis not present

## 2024-06-05 DIAGNOSIS — E1121 Type 2 diabetes mellitus with diabetic nephropathy: Secondary | ICD-10-CM | POA: Diagnosis not present

## 2024-06-05 DIAGNOSIS — I679 Cerebrovascular disease, unspecified: Secondary | ICD-10-CM | POA: Diagnosis not present

## 2024-06-07 DIAGNOSIS — I10 Essential (primary) hypertension: Secondary | ICD-10-CM | POA: Diagnosis not present

## 2024-06-07 DIAGNOSIS — N184 Chronic kidney disease, stage 4 (severe): Secondary | ICD-10-CM | POA: Diagnosis not present

## 2024-06-07 DIAGNOSIS — F03918 Unspecified dementia, unspecified severity, with other behavioral disturbance: Secondary | ICD-10-CM | POA: Diagnosis not present

## 2024-06-07 DIAGNOSIS — E1121 Type 2 diabetes mellitus with diabetic nephropathy: Secondary | ICD-10-CM | POA: Diagnosis not present

## 2024-06-07 DIAGNOSIS — I6350 Cerebral infarction due to unspecified occlusion or stenosis of unspecified cerebral artery: Secondary | ICD-10-CM | POA: Diagnosis not present

## 2024-06-07 DIAGNOSIS — I679 Cerebrovascular disease, unspecified: Secondary | ICD-10-CM | POA: Diagnosis not present

## 2024-06-12 DIAGNOSIS — E1121 Type 2 diabetes mellitus with diabetic nephropathy: Secondary | ICD-10-CM | POA: Diagnosis not present

## 2024-06-12 DIAGNOSIS — I10 Essential (primary) hypertension: Secondary | ICD-10-CM | POA: Diagnosis not present

## 2024-06-12 DIAGNOSIS — F03918 Unspecified dementia, unspecified severity, with other behavioral disturbance: Secondary | ICD-10-CM | POA: Diagnosis not present

## 2024-06-12 DIAGNOSIS — N184 Chronic kidney disease, stage 4 (severe): Secondary | ICD-10-CM | POA: Diagnosis not present

## 2024-06-12 DIAGNOSIS — I679 Cerebrovascular disease, unspecified: Secondary | ICD-10-CM | POA: Diagnosis not present

## 2024-06-12 DIAGNOSIS — I6350 Cerebral infarction due to unspecified occlusion or stenosis of unspecified cerebral artery: Secondary | ICD-10-CM | POA: Diagnosis not present

## 2024-06-21 DIAGNOSIS — N184 Chronic kidney disease, stage 4 (severe): Secondary | ICD-10-CM | POA: Diagnosis not present

## 2024-06-21 DIAGNOSIS — I10 Essential (primary) hypertension: Secondary | ICD-10-CM | POA: Diagnosis not present

## 2024-06-21 DIAGNOSIS — F03918 Unspecified dementia, unspecified severity, with other behavioral disturbance: Secondary | ICD-10-CM | POA: Diagnosis not present

## 2024-06-21 DIAGNOSIS — E1121 Type 2 diabetes mellitus with diabetic nephropathy: Secondary | ICD-10-CM | POA: Diagnosis not present

## 2024-06-21 DIAGNOSIS — I679 Cerebrovascular disease, unspecified: Secondary | ICD-10-CM | POA: Diagnosis not present

## 2024-06-21 DIAGNOSIS — I6350 Cerebral infarction due to unspecified occlusion or stenosis of unspecified cerebral artery: Secondary | ICD-10-CM | POA: Diagnosis not present

## 2024-06-26 DIAGNOSIS — I6350 Cerebral infarction due to unspecified occlusion or stenosis of unspecified cerebral artery: Secondary | ICD-10-CM | POA: Diagnosis not present

## 2024-06-26 DIAGNOSIS — F03918 Unspecified dementia, unspecified severity, with other behavioral disturbance: Secondary | ICD-10-CM | POA: Diagnosis not present

## 2024-06-26 DIAGNOSIS — I10 Essential (primary) hypertension: Secondary | ICD-10-CM | POA: Diagnosis not present

## 2024-06-26 DIAGNOSIS — E1121 Type 2 diabetes mellitus with diabetic nephropathy: Secondary | ICD-10-CM | POA: Diagnosis not present

## 2024-06-26 DIAGNOSIS — I679 Cerebrovascular disease, unspecified: Secondary | ICD-10-CM | POA: Diagnosis not present

## 2024-06-26 DIAGNOSIS — N184 Chronic kidney disease, stage 4 (severe): Secondary | ICD-10-CM | POA: Diagnosis not present

## 2024-06-29 DIAGNOSIS — E785 Hyperlipidemia, unspecified: Secondary | ICD-10-CM | POA: Diagnosis not present

## 2024-06-29 DIAGNOSIS — F03918 Unspecified dementia, unspecified severity, with other behavioral disturbance: Secondary | ICD-10-CM | POA: Diagnosis not present

## 2024-06-29 DIAGNOSIS — I509 Heart failure, unspecified: Secondary | ICD-10-CM | POA: Diagnosis not present

## 2024-06-29 DIAGNOSIS — I10 Essential (primary) hypertension: Secondary | ICD-10-CM | POA: Diagnosis not present

## 2024-06-29 DIAGNOSIS — H4089 Other specified glaucoma: Secondary | ICD-10-CM | POA: Diagnosis not present

## 2024-06-29 DIAGNOSIS — I6350 Cerebral infarction due to unspecified occlusion or stenosis of unspecified cerebral artery: Secondary | ICD-10-CM | POA: Diagnosis not present

## 2024-06-29 DIAGNOSIS — F329 Major depressive disorder, single episode, unspecified: Secondary | ICD-10-CM | POA: Diagnosis not present

## 2024-06-29 DIAGNOSIS — E039 Hypothyroidism, unspecified: Secondary | ICD-10-CM | POA: Diagnosis not present

## 2024-06-29 DIAGNOSIS — I679 Cerebrovascular disease, unspecified: Secondary | ICD-10-CM | POA: Diagnosis not present

## 2024-06-29 DIAGNOSIS — J45909 Unspecified asthma, uncomplicated: Secondary | ICD-10-CM | POA: Diagnosis not present

## 2024-06-29 DIAGNOSIS — E1121 Type 2 diabetes mellitus with diabetic nephropathy: Secondary | ICD-10-CM | POA: Diagnosis not present

## 2024-06-29 DIAGNOSIS — N184 Chronic kidney disease, stage 4 (severe): Secondary | ICD-10-CM | POA: Diagnosis not present

## 2024-07-03 DIAGNOSIS — E1121 Type 2 diabetes mellitus with diabetic nephropathy: Secondary | ICD-10-CM | POA: Diagnosis not present

## 2024-07-03 DIAGNOSIS — I679 Cerebrovascular disease, unspecified: Secondary | ICD-10-CM | POA: Diagnosis not present

## 2024-07-03 DIAGNOSIS — I6350 Cerebral infarction due to unspecified occlusion or stenosis of unspecified cerebral artery: Secondary | ICD-10-CM | POA: Diagnosis not present

## 2024-07-03 DIAGNOSIS — I10 Essential (primary) hypertension: Secondary | ICD-10-CM | POA: Diagnosis not present

## 2024-07-03 DIAGNOSIS — F03918 Unspecified dementia, unspecified severity, with other behavioral disturbance: Secondary | ICD-10-CM | POA: Diagnosis not present

## 2024-07-03 DIAGNOSIS — N184 Chronic kidney disease, stage 4 (severe): Secondary | ICD-10-CM | POA: Diagnosis not present

## 2024-07-10 DIAGNOSIS — N184 Chronic kidney disease, stage 4 (severe): Secondary | ICD-10-CM | POA: Diagnosis not present

## 2024-07-10 DIAGNOSIS — I10 Essential (primary) hypertension: Secondary | ICD-10-CM | POA: Diagnosis not present

## 2024-07-10 DIAGNOSIS — I679 Cerebrovascular disease, unspecified: Secondary | ICD-10-CM | POA: Diagnosis not present

## 2024-07-10 DIAGNOSIS — E1121 Type 2 diabetes mellitus with diabetic nephropathy: Secondary | ICD-10-CM | POA: Diagnosis not present

## 2024-07-10 DIAGNOSIS — I6350 Cerebral infarction due to unspecified occlusion or stenosis of unspecified cerebral artery: Secondary | ICD-10-CM | POA: Diagnosis not present

## 2024-07-10 DIAGNOSIS — F03918 Unspecified dementia, unspecified severity, with other behavioral disturbance: Secondary | ICD-10-CM | POA: Diagnosis not present

## 2024-07-11 DIAGNOSIS — I679 Cerebrovascular disease, unspecified: Secondary | ICD-10-CM | POA: Diagnosis not present

## 2024-07-11 DIAGNOSIS — F03918 Unspecified dementia, unspecified severity, with other behavioral disturbance: Secondary | ICD-10-CM | POA: Diagnosis not present

## 2024-07-11 DIAGNOSIS — I10 Essential (primary) hypertension: Secondary | ICD-10-CM | POA: Diagnosis not present

## 2024-07-11 DIAGNOSIS — E1121 Type 2 diabetes mellitus with diabetic nephropathy: Secondary | ICD-10-CM | POA: Diagnosis not present

## 2024-07-11 DIAGNOSIS — N184 Chronic kidney disease, stage 4 (severe): Secondary | ICD-10-CM | POA: Diagnosis not present

## 2024-07-11 DIAGNOSIS — I6350 Cerebral infarction due to unspecified occlusion or stenosis of unspecified cerebral artery: Secondary | ICD-10-CM | POA: Diagnosis not present

## 2024-07-12 DIAGNOSIS — N184 Chronic kidney disease, stage 4 (severe): Secondary | ICD-10-CM | POA: Diagnosis not present

## 2024-07-12 DIAGNOSIS — E1121 Type 2 diabetes mellitus with diabetic nephropathy: Secondary | ICD-10-CM | POA: Diagnosis not present

## 2024-07-12 DIAGNOSIS — I679 Cerebrovascular disease, unspecified: Secondary | ICD-10-CM | POA: Diagnosis not present

## 2024-07-12 DIAGNOSIS — F03918 Unspecified dementia, unspecified severity, with other behavioral disturbance: Secondary | ICD-10-CM | POA: Diagnosis not present

## 2024-07-12 DIAGNOSIS — I6350 Cerebral infarction due to unspecified occlusion or stenosis of unspecified cerebral artery: Secondary | ICD-10-CM | POA: Diagnosis not present

## 2024-07-12 DIAGNOSIS — I10 Essential (primary) hypertension: Secondary | ICD-10-CM | POA: Diagnosis not present

## 2024-07-17 DIAGNOSIS — I10 Essential (primary) hypertension: Secondary | ICD-10-CM | POA: Diagnosis not present

## 2024-07-17 DIAGNOSIS — E1121 Type 2 diabetes mellitus with diabetic nephropathy: Secondary | ICD-10-CM | POA: Diagnosis not present

## 2024-07-17 DIAGNOSIS — N184 Chronic kidney disease, stage 4 (severe): Secondary | ICD-10-CM | POA: Diagnosis not present

## 2024-07-17 DIAGNOSIS — I6350 Cerebral infarction due to unspecified occlusion or stenosis of unspecified cerebral artery: Secondary | ICD-10-CM | POA: Diagnosis not present

## 2024-07-17 DIAGNOSIS — I679 Cerebrovascular disease, unspecified: Secondary | ICD-10-CM | POA: Diagnosis not present

## 2024-07-17 DIAGNOSIS — F03918 Unspecified dementia, unspecified severity, with other behavioral disturbance: Secondary | ICD-10-CM | POA: Diagnosis not present

## 2024-07-24 DIAGNOSIS — I679 Cerebrovascular disease, unspecified: Secondary | ICD-10-CM | POA: Diagnosis not present

## 2024-07-24 DIAGNOSIS — I10 Essential (primary) hypertension: Secondary | ICD-10-CM | POA: Diagnosis not present

## 2024-07-24 DIAGNOSIS — N184 Chronic kidney disease, stage 4 (severe): Secondary | ICD-10-CM | POA: Diagnosis not present

## 2024-07-24 DIAGNOSIS — F03918 Unspecified dementia, unspecified severity, with other behavioral disturbance: Secondary | ICD-10-CM | POA: Diagnosis not present

## 2024-07-24 DIAGNOSIS — E1121 Type 2 diabetes mellitus with diabetic nephropathy: Secondary | ICD-10-CM | POA: Diagnosis not present

## 2024-07-24 DIAGNOSIS — I6350 Cerebral infarction due to unspecified occlusion or stenosis of unspecified cerebral artery: Secondary | ICD-10-CM | POA: Diagnosis not present

## 2024-07-26 DIAGNOSIS — I10 Essential (primary) hypertension: Secondary | ICD-10-CM | POA: Diagnosis not present

## 2024-07-26 DIAGNOSIS — I6350 Cerebral infarction due to unspecified occlusion or stenosis of unspecified cerebral artery: Secondary | ICD-10-CM | POA: Diagnosis not present

## 2024-07-26 DIAGNOSIS — F03918 Unspecified dementia, unspecified severity, with other behavioral disturbance: Secondary | ICD-10-CM | POA: Diagnosis not present

## 2024-07-26 DIAGNOSIS — I679 Cerebrovascular disease, unspecified: Secondary | ICD-10-CM | POA: Diagnosis not present

## 2024-07-26 DIAGNOSIS — N184 Chronic kidney disease, stage 4 (severe): Secondary | ICD-10-CM | POA: Diagnosis not present

## 2024-07-26 DIAGNOSIS — E1121 Type 2 diabetes mellitus with diabetic nephropathy: Secondary | ICD-10-CM | POA: Diagnosis not present

## 2024-07-27 DIAGNOSIS — N184 Chronic kidney disease, stage 4 (severe): Secondary | ICD-10-CM | POA: Diagnosis not present

## 2024-07-27 DIAGNOSIS — I10 Essential (primary) hypertension: Secondary | ICD-10-CM | POA: Diagnosis not present

## 2024-07-27 DIAGNOSIS — I6350 Cerebral infarction due to unspecified occlusion or stenosis of unspecified cerebral artery: Secondary | ICD-10-CM | POA: Diagnosis not present

## 2024-07-27 DIAGNOSIS — F03918 Unspecified dementia, unspecified severity, with other behavioral disturbance: Secondary | ICD-10-CM | POA: Diagnosis not present

## 2024-07-27 DIAGNOSIS — E1121 Type 2 diabetes mellitus with diabetic nephropathy: Secondary | ICD-10-CM | POA: Diagnosis not present

## 2024-07-27 DIAGNOSIS — I679 Cerebrovascular disease, unspecified: Secondary | ICD-10-CM | POA: Diagnosis not present

## 2024-08-13 ENCOUNTER — Emergency Department (HOSPITAL_COMMUNITY)

## 2024-08-13 ENCOUNTER — Encounter (HOSPITAL_COMMUNITY): Payer: Self-pay

## 2024-08-13 ENCOUNTER — Inpatient Hospital Stay (HOSPITAL_COMMUNITY)
Admission: EM | Admit: 2024-08-13 | Discharge: 2024-08-15 | DRG: 884 | Disposition: A | Discharge status: Hospice | Attending: Internal Medicine | Admitting: Internal Medicine

## 2024-08-13 ENCOUNTER — Other Ambulatory Visit: Payer: Self-pay

## 2024-08-13 DIAGNOSIS — Z7401 Bed confinement status: Secondary | ICD-10-CM

## 2024-08-13 DIAGNOSIS — D638 Anemia in other chronic diseases classified elsewhere: Secondary | ICD-10-CM | POA: Diagnosis present

## 2024-08-13 DIAGNOSIS — E559 Vitamin D deficiency, unspecified: Secondary | ICD-10-CM | POA: Diagnosis present

## 2024-08-13 DIAGNOSIS — Z91018 Allergy to other foods: Secondary | ICD-10-CM

## 2024-08-13 DIAGNOSIS — R55 Syncope and collapse: Principal | ICD-10-CM | POA: Diagnosis present

## 2024-08-13 DIAGNOSIS — Z90710 Acquired absence of both cervix and uterus: Secondary | ICD-10-CM

## 2024-08-13 DIAGNOSIS — I129 Hypertensive chronic kidney disease with stage 1 through stage 4 chronic kidney disease, or unspecified chronic kidney disease: Secondary | ICD-10-CM | POA: Diagnosis present

## 2024-08-13 DIAGNOSIS — R231 Pallor: Secondary | ICD-10-CM | POA: Diagnosis not present

## 2024-08-13 DIAGNOSIS — F0393 Unspecified dementia, unspecified severity, with mood disturbance: Secondary | ICD-10-CM | POA: Diagnosis not present

## 2024-08-13 DIAGNOSIS — R001 Bradycardia, unspecified: Secondary | ICD-10-CM | POA: Diagnosis present

## 2024-08-13 DIAGNOSIS — Z888 Allergy status to other drugs, medicaments and biological substances status: Secondary | ICD-10-CM

## 2024-08-13 DIAGNOSIS — R404 Transient alteration of awareness: Secondary | ICD-10-CM | POA: Diagnosis not present

## 2024-08-13 DIAGNOSIS — J9601 Acute respiratory failure with hypoxia: Secondary | ICD-10-CM

## 2024-08-13 DIAGNOSIS — Z7982 Long term (current) use of aspirin: Secondary | ICD-10-CM

## 2024-08-13 DIAGNOSIS — R29818 Other symptoms and signs involving the nervous system: Secondary | ICD-10-CM | POA: Diagnosis not present

## 2024-08-13 DIAGNOSIS — Z8673 Personal history of transient ischemic attack (TIA), and cerebral infarction without residual deficits: Secondary | ICD-10-CM

## 2024-08-13 DIAGNOSIS — G40909 Epilepsy, unspecified, not intractable, without status epilepticus: Secondary | ICD-10-CM | POA: Diagnosis present

## 2024-08-13 DIAGNOSIS — I959 Hypotension, unspecified: Secondary | ICD-10-CM

## 2024-08-13 DIAGNOSIS — E039 Hypothyroidism, unspecified: Secondary | ICD-10-CM | POA: Diagnosis present

## 2024-08-13 DIAGNOSIS — Z79899 Other long term (current) drug therapy: Secondary | ICD-10-CM

## 2024-08-13 DIAGNOSIS — R4020 Unspecified coma: Secondary | ICD-10-CM

## 2024-08-13 DIAGNOSIS — Z794 Long term (current) use of insulin: Secondary | ICD-10-CM

## 2024-08-13 DIAGNOSIS — R569 Unspecified convulsions: Secondary | ICD-10-CM

## 2024-08-13 DIAGNOSIS — I6782 Cerebral ischemia: Secondary | ICD-10-CM | POA: Diagnosis not present

## 2024-08-13 DIAGNOSIS — Z961 Presence of intraocular lens: Secondary | ICD-10-CM | POA: Diagnosis present

## 2024-08-13 DIAGNOSIS — N184 Chronic kidney disease, stage 4 (severe): Secondary | ICD-10-CM | POA: Diagnosis present

## 2024-08-13 DIAGNOSIS — Z833 Family history of diabetes mellitus: Secondary | ICD-10-CM

## 2024-08-13 DIAGNOSIS — G4089 Other seizures: Secondary | ICD-10-CM | POA: Diagnosis not present

## 2024-08-13 DIAGNOSIS — E1142 Type 2 diabetes mellitus with diabetic polyneuropathy: Secondary | ICD-10-CM | POA: Diagnosis present

## 2024-08-13 DIAGNOSIS — Z83719 Family history of colon polyps, unspecified: Secondary | ICD-10-CM

## 2024-08-13 DIAGNOSIS — Z9049 Acquired absence of other specified parts of digestive tract: Secondary | ICD-10-CM

## 2024-08-13 DIAGNOSIS — G9389 Other specified disorders of brain: Secondary | ICD-10-CM

## 2024-08-13 DIAGNOSIS — G9349 Other encephalopathy: Secondary | ICD-10-CM | POA: Diagnosis present

## 2024-08-13 DIAGNOSIS — E1122 Type 2 diabetes mellitus with diabetic chronic kidney disease: Secondary | ICD-10-CM | POA: Diagnosis present

## 2024-08-13 DIAGNOSIS — Z881 Allergy status to other antibiotic agents status: Secondary | ICD-10-CM

## 2024-08-13 DIAGNOSIS — Z8249 Family history of ischemic heart disease and other diseases of the circulatory system: Secondary | ICD-10-CM

## 2024-08-13 DIAGNOSIS — G9340 Encephalopathy, unspecified: Secondary | ICD-10-CM

## 2024-08-13 DIAGNOSIS — Z974 Presence of external hearing-aid: Secondary | ICD-10-CM

## 2024-08-13 DIAGNOSIS — Z841 Family history of disorders of kidney and ureter: Secondary | ICD-10-CM

## 2024-08-13 DIAGNOSIS — E785 Hyperlipidemia, unspecified: Secondary | ICD-10-CM | POA: Diagnosis present

## 2024-08-13 DIAGNOSIS — Z66 Do not resuscitate: Secondary | ICD-10-CM | POA: Diagnosis present

## 2024-08-13 DIAGNOSIS — Z823 Family history of stroke: Secondary | ICD-10-CM

## 2024-08-13 DIAGNOSIS — Z91040 Latex allergy status: Secondary | ICD-10-CM

## 2024-08-13 DIAGNOSIS — Z79890 Hormone replacement therapy (postmenopausal): Secondary | ICD-10-CM

## 2024-08-13 DIAGNOSIS — Z1152 Encounter for screening for COVID-19: Secondary | ICD-10-CM

## 2024-08-13 LAB — CBC
HCT: 34.6 % — ABNORMAL LOW (ref 36.0–46.0)
Hemoglobin: 11.5 g/dL — ABNORMAL LOW (ref 12.0–15.0)
MCH: 30.3 pg (ref 26.0–34.0)
MCHC: 33.2 g/dL (ref 30.0–36.0)
MCV: 91.1 fL (ref 80.0–100.0)
Platelets: 241 K/uL (ref 150–400)
RBC: 3.8 MIL/uL — ABNORMAL LOW (ref 3.87–5.11)
RDW: 12.7 % (ref 11.5–15.5)
WBC: 6.8 K/uL (ref 4.0–10.5)
nRBC: 0 % (ref 0.0–0.2)

## 2024-08-13 LAB — COMPREHENSIVE METABOLIC PANEL WITH GFR
ALT: 17 U/L (ref 0–44)
AST: 25 U/L (ref 15–41)
Albumin: 3.3 g/dL — ABNORMAL LOW (ref 3.5–5.0)
Alkaline Phosphatase: 52 U/L (ref 38–126)
Anion gap: 11 (ref 5–15)
BUN: 30 mg/dL — ABNORMAL HIGH (ref 8–23)
CO2: 24 mmol/L (ref 22–32)
Calcium: 9.2 mg/dL (ref 8.9–10.3)
Chloride: 106 mmol/L (ref 98–111)
Creatinine, Ser: 1.99 mg/dL — ABNORMAL HIGH (ref 0.44–1.00)
GFR, Estimated: 24 mL/min — ABNORMAL LOW (ref >60)
Glucose, Bld: 145 mg/dL — ABNORMAL HIGH (ref 70–99)
Potassium: 4.5 mmol/L (ref 3.5–5.1)
Sodium: 141 mmol/L (ref 135–145)
Total Bilirubin: 0.4 mg/dL (ref 0.0–1.2)
Total Protein: 6.2 g/dL — ABNORMAL LOW (ref 6.5–8.1)

## 2024-08-13 LAB — DIFFERENTIAL
Abs Immature Granulocytes: 0.02 K/uL (ref 0.00–0.07)
Basophils Absolute: 0.1 K/uL (ref 0.0–0.1)
Basophils Relative: 1 %
Eosinophils Absolute: 0.1 K/uL (ref 0.0–0.5)
Eosinophils Relative: 1 %
Immature Granulocytes: 0 %
Lymphocytes Relative: 44 %
Lymphs Abs: 3 K/uL (ref 0.7–4.0)
Monocytes Absolute: 0.5 K/uL (ref 0.1–1.0)
Monocytes Relative: 7 %
Neutro Abs: 3.2 K/uL (ref 1.7–7.7)
Neutrophils Relative %: 47 %

## 2024-08-13 LAB — I-STAT CHEM 8, ED
BUN: 29 mg/dL — ABNORMAL HIGH (ref 8–23)
Calcium, Ion: 1.17 mmol/L (ref 1.15–1.40)
Chloride: 108 mmol/L (ref 98–111)
Creatinine, Ser: 2.1 mg/dL — ABNORMAL HIGH (ref 0.44–1.00)
Glucose, Bld: 138 mg/dL — ABNORMAL HIGH (ref 70–99)
HCT: 32 % — ABNORMAL LOW (ref 36.0–46.0)
Hemoglobin: 10.9 g/dL — ABNORMAL LOW (ref 12.0–15.0)
Potassium: 4.1 mmol/L (ref 3.5–5.1)
Sodium: 142 mmol/L (ref 135–145)
TCO2: 23 mmol/L (ref 22–32)

## 2024-08-13 LAB — PROTIME-INR
INR: 1 (ref 0.8–1.2)
Prothrombin Time: 14.2 s (ref 11.4–15.2)

## 2024-08-13 LAB — CBG MONITORING, ED: Glucose-Capillary: 130 mg/dL — ABNORMAL HIGH (ref 70–99)

## 2024-08-13 LAB — APTT: aPTT: 33 s (ref 24–36)

## 2024-08-13 MED ORDER — LORAZEPAM 2 MG/ML IJ SOLN
2.0000 mg | Freq: Once | INTRAMUSCULAR | Status: DC
  Filled 2024-08-13: qty 1

## 2024-08-13 MED ORDER — VALPROATE SODIUM 100 MG/ML IV SOLN
1250.0000 mg | Freq: Once | INTRAVENOUS | Status: AC
  Administered 2024-08-13: 1250 mg via INTRAVENOUS
  Filled 2024-08-13: qty 12.5

## 2024-08-13 MED ORDER — DILTIAZEM HCL 25 MG/5ML IV SOLN: 5.0000 mg | Freq: Once | INTRAVENOUS | Status: DC

## 2024-08-13 MED ORDER — VALPROATE SODIUM 100 MG/ML IV SOLN
300.0000 mg | Freq: Three times a day (TID) | INTRAVENOUS | Status: DC
  Administered 2024-08-14 (×2): 300 mg via INTRAVENOUS
  Filled 2024-08-13 (×5): qty 3

## 2024-08-13 MED ORDER — LACTATED RINGERS IV BOLUS
1000.0000 mL | Freq: Once | INTRAVENOUS | Status: AC
  Administered 2024-08-13: 1000 mL via INTRAVENOUS

## 2024-08-13 NOTE — ED Provider Notes (Signed)
  EMERGENCY DEPARTMENT AT [facility] Provider Note HPI Rodneshia Greenhouse is a 88 y.o. female with a PMH of type 2 diabetes mellitus, chronic kidney disease stage IIIb/IV, with baseline serum creatinine of 1.68; hypertension, recurrent vasovagal syncope, diverticulosis, asthma, hyperlipidemia, and history of CVA with intracranial bleed.   [place], hypotensive. Moving only L side after intially moving both. Pacing for 15 minutes. Epi drip for 15 minutes  Past Medical History:  Diagnosis Date   Anemia    Anxiety    Arthritis    Asthma    Biliary colic    C. difficile colitis 08/2022   Cholelithiasis    CKD (chronic kidney disease), stage III [facility])    nephrologist--  dr oliver   Colon polyps    Diverticulosis of colon    Gastric ulcer    Glaucoma    History of asthma    1980's  no longer problem since 1980's   History of diverticulitis of colon    recurrent--  2014;  2013;  2012   Hyperlipidemia    Hypertension    Hypothyroidism    Insulin  dependent type 2 diabetes mellitus (HCC)    followed by dr norleen lambeth ([facility])    Murmur    Peripheral neuropathy    Stroke [facility]    Vitamin D  deficiency    Wears hearing aid    bilateral   Wears partial dentures    lower partial and upper full   Past Surgical History:  Procedure Laterality Date   ABDOMINAL HYSTERECTOMY  1970's   CARDIOVASCULAR STRESS TEST  07/30/2009   normal exercise lexiscan  nuclear study w/ no ischemia/  normal LV funciton and wall motion, ef 77%   CATARACT EXTRACTION W/ INTRAOCULAR LENS  IMPLANT, BILATERAL  2014   CATARACT EXTRACTION, BILATERAL Bilateral 2021   LAPAROSCOPIC CHOLECYSTECTOMY SINGLE SITE WITH INTRAOPERATIVE CHOLANGIOGRAM N/A 08/25/2016   Procedure: LAPAROSCOPIC CHOLECYSTECTOMY WITH INTRAOPERATIVE CHOLANGIOGRAM;  Surgeon: Herlene Righter Kinsinger, MD;  Location: [facility];  Service: General;  Laterality: N/A;   LUMBAR SPINE SURGERY  2014   REMOVAL AXILLA CYST Right  1990's    Review of Systems Pertinent positives and negative findings are listed as part of the History of Present Illness and MDM  Physical Exam Vitals:   08/13/24 2122 08/13/24 2124 08/13/24 2131  BP: (!) 160/80    Pulse: 76    Resp: 12    SpO2: 100%    Weight:   61.1 kg  Height:  5' 6 (1.676 m)      Constitutional Nursing notes reviewed Vital signs reviewed  HEENT No obvious trauma Pupils round, equal, and reactive to light. Pupils cross midline Neck supple  Respiratory Effort normal Breathing well on room air CTAB  CV Normal rate and rhythm *** ***No pitting edema  Abdomen Soft, non-tender, non-distended No peritonitis  MSK Atraumatic No obvious deformity ROM appropriate  Neuro Awake and alert Pupils cross midline Moving all extremities    MDM:  Initial Differential Diagnoses includes ***  I reviewed the patient's vitals, the nursing triage note and evaluated the patient at bedside.   I reviewed the patient's external records which show ***  I considered ACS and reviewed/interpreted the EKG. EKG shows normal sinus rhythm with no evidence of acute ischemic changes or high grade conduction block. There is no STEMI or contiguous T wave inversions. No evidence of new-onset arrythmia. The PR, QRS, and QT intervals are normal.   The patient was risk stratified with a  HEAR score of ***. Troponin  *** --> ***. This significantly lowers my suspicion for ACS.  Procedures: Procedures  Medications administered in the ED: Medications  valproate (DEPACON ) 1,250 mg in sodium chloride  0.9 % 100 mL IVPB (has no administration in time range)    Followed by  valproate (DEPACON ) 300 mg in sodium chloride  0.9 % 100 mL IVPB (has no administration in time range)  LORazepam  (ATIVAN ) injection 2 mg (has no administration in time range)     Impression: No diagnosis found.   Patient's presentation is most consistent with {EM COPA:27473}  Disposition: ED Disposition:  Data  Unavailable   ***Discharge: Patient is felt to be medically appropriate for discharge at this time. Patient was instructed to follow up with their primary care doctor/specialists listed above for re-evaluation. Patient was given strict return precautions.  ED Discharge Orders     None

## 2024-08-13 NOTE — ED Triage Notes (Addendum)
 Pt arrives from home via [facility] due to syncope. Daughter reports she had a syncopal episode when moving from a chair to toilet. No head injury. Daughter reports she usually mumbles, but is alert. LSN around 2000. Initial BP was 68 systolic, HR-38 and RR 6. EMS began bagging her and paced her for about 6 minutes. HR increased to 70s. EMS also started an EPI drip en route at 4 mcg/min. She arrives to ED unresponsive and only moving left arm.

## 2024-08-13 NOTE — Plan of Care (Incomplete)
 88 year old female with history of anemia, anxiety, arthritis, asthma, CKD stage IIIb-IV, glaucoma, hypertension, hyperlipidemia, hypothyroidism, insulin -dependent type 2 diabetes, peripheral neuropathy, history of CVA with intracranial bleed, dementia, recurrent vasovagal syncope, diverticulosis presents to the ED via EMS for evaluation of syncope.  Daughter reported to EMS that patient had a syncopal episode when moving from a chair to the toilet.  No head injury reported.  Daughter reported that patient usually mumbles but is alert.  LKW 2000.  Initial SBP 68 with EMS, heart rate 38, and respiratory rate 6.  EMS provided BVM ventilations.  Temporarily paced for about 15 minutes and required epinephrine  drip for 15 minutes.  Heart rate subsequently improved to 70s.  Patient arrived to the ED unresponsive and only moving her left arm.  Code stroke activated on arrival and patient was seen by neurology.  Labs***  Patient was given 1 L LR.  Neurology suspecting seizures and recommended admission by medicine service.  Neurology will arrange for continuous EEG monitoring and started the patient on IV Depacon  1250 mg loading dose followed by 300 mg every 8 hours.

## 2024-08-13 NOTE — Consult Note (Incomplete)
 NEUROLOGY CONSULT NOTE   Date of service: August 13, 2024 Patient Name: Diana Harper MRN:  [id number] DOB:  02-11-1936 Chief Complaint: AMS and not moving left arm Requesting Provider: No att. providers found  History of Present Illness  Diana Harper is a 88 y.o. female with a PMHx as documented below (reviewed) including advanced dementia (bedbound at baseline), stroke, HTN and HLD who presents to the ED via [facility] due to syncope. Daughter reported she had a syncopal episode when moving from a chair to toilet. No head injury. Daughter reports she usually mumbles, but is alert. LSN was 2030. Initial BP was 68 systolic, HR-38 and RR 6. EMS began bagging her and paced her for about 6 minutes. HR increased to 70s. EMS also started an EPI drip en route at 4 mcg/min. She arrives to ED unresponsive and only moving her left arm per ED staff. She was able to move all 4 extremities equally to noxious in CT.   LKW: 2030 Modified rankin score: 5-Severe disability-bedridden, incontinent, needs constant attention IV Thrombolysis: No: Exam findings are most consistent with AMS secondary to toxic/metabolic or infectious etiology.  EVT:  No: mRS of 5  NIHSS components Score: Comment  1a Level of Conscious 0[]  1[]  2[x]  3[]     Groans and moves to noxious. Does not respond to questions and commands and does not visually fixate.   1b LOC Questions 0[]  1[]  2[x]       1c LOC Commands 0[]  1[]  2[x]       2 Best Gaze 0[x]  1[]  2[]      Eyes are midline with slight slow roving EOM that are conjugate  3 Visual 0[]  1[]  2[]  3[x]     No blink to threat bilaterally  4 Facial Palsy 0[x]  1[]  2[]  3[]      5a Motor Arm - left 0[]  1[]  2[]  3[x]  4[]  UN[]   Not following any commands to BUE. Increased tone bilaterally  5b Motor Arm - Right 0[]  1[]  2[]  3[x]  4[]  UN[]    6a Motor Leg - Left 0[]  1[]  2[]  3[x]  4[]  UN[]   BLE flexed at hips and knees in partial fetal position, with increased flexor tone  6b Motor Leg - Right 0[]  1[]  2[]   3[x]  4[]  UN[]    7 Limb Ataxia 0[x]  1[]  2[]  UN[]      8 Sensory 0[x]  1[]  2[]  UN[]     Reacts to noxious x 4  9 Best Language 0[]  1[]  2[]  3[x]      10 Dysarthria 0[]  1[]  2[x]  UN[]      11 Extinct. and Inattention 0[x]  1[]  2[]       TOTAL:   26    Other findings: During exam in CT, she was initially murmuring incomprehensibly to stimuli, then suddenly had arrest of all withdrawal movements to noxious, cessation of all murmuring, with arms moving to an extended and abducted position, suggestive of tonic seizure activity.   ROS  Unable to ascertain due to cognitive/communication deficit.   Past History   Past Medical History:  Diagnosis Date   Anemia    Anxiety    Arthritis    Asthma    Biliary colic    C. difficile colitis 08/2022   Cholelithiasis    CKD (chronic kidney disease), stage III [facility])    nephrologist--  dr oliver   Colon polyps    Diverticulosis of colon    Gastric ulcer    Glaucoma    History of asthma    1980's  no longer problem since 1980's   History of diverticulitis  of colon    recurrent--  2014;  2013;  2012   Hyperlipidemia    Hypertension    Hypothyroidism    Insulin  dependent type 2 diabetes mellitus (HCC)    followed by dr norleen lambeth ([facility])    Murmur    Peripheral neuropathy    Stroke [facility])    Vitamin D  deficiency    Wears hearing aid    bilateral   Wears partial dentures    lower partial and upper full    Past Surgical History:  Procedure Laterality Date   ABDOMINAL HYSTERECTOMY  1970's   CARDIOVASCULAR STRESS TEST  07/30/2009   normal exercise lexiscan  nuclear study w/ no ischemia/  normal LV funciton and wall motion, ef 77%   CATARACT EXTRACTION W/ INTRAOCULAR LENS  IMPLANT, BILATERAL  2014   CATARACT EXTRACTION, BILATERAL Bilateral 2021   LAPAROSCOPIC CHOLECYSTECTOMY SINGLE SITE WITH INTRAOPERATIVE CHOLANGIOGRAM N/A 08/25/2016   Procedure: LAPAROSCOPIC CHOLECYSTECTOMY WITH INTRAOPERATIVE CHOLANGIOGRAM;  Surgeon: Herlene Beverley Bureau,  MD;  Location: [facility];  Service: General;  Laterality: N/A;   LUMBAR SPINE SURGERY  2014   REMOVAL AXILLA CYST Right 1990's    Family History: Family History  Problem Relation Age of Onset   Stroke Mother    Colon polyps Mother    Diabetes Mother    Diabetes Father    Kidney failure Father    COPD Father    Colon polyps Sister    Diabetes Sister    Stroke Brother    Diabetes Brother    Heart attack Maternal Grandmother    Heart disease Maternal Grandfather    Diabetes Paternal Grandmother    Stroke Paternal Grandfather    Hypertension Paternal Grandfather    Diabetes Sister    Diabetes Sister    Diabetes Son    Diabetes Daughter    Colon cancer Neg Hx    Esophageal cancer Neg Hx    Inflammatory bowel disease Neg Hx    Liver disease Neg Hx    Pancreatic cancer Neg Hx    Rectal cancer Neg Hx    Stomach cancer Neg Hx     Social History  reports that she has never smoked. She has never used smokeless tobacco. She reports that she does not drink alcohol  and does not use drugs.  Allergies  Allergen Reactions   Latex Anaphylaxis, Shortness Of Breath and Other (See Comments)    Severe respiratory distress   Onion Diarrhea   Other Other (See Comments)    Patient cannot have TREE NUTS and anything with seeds- History of  diverticulitis    Flexeril  [Cyclobenzaprine ] Other (See Comments)    Caused agitation   Lisinopril Cough   Metformin And Related Diarrhea   Simvastatin Hives and Rash   Tetracycline Rash   Zithromax [Azithromycin] Rash    Medications  No current facility-administered medications for this encounter.  Current Outpatient Medications:    acetaminophen  (TYLENOL ) 325 MG suppository, Place 1 suppository (325 mg total) rectally every 4 (four) hours as needed for fever or mild pain (pain score 1-3)., Disp: 12 suppository, Rfl: 0   albuterol  (VENTOLIN  HFA) 108 (90 Base) MCG/ACT inhaler, Inhale 2 puffs into the lungs every 4 (four) hours as  needed for wheezing or shortness of breath., Disp: , Rfl:    amLODipine  (NORVASC ) 10 MG tablet, Take 1 tablet (10 mg total) by mouth daily., Disp: 30 tablet, Rfl: 1   ARIPiprazole  (ABILIFY ) 2 MG tablet, Take 2 mg by mouth daily.,  Disp: , Rfl:    aspirin  EC 81 MG tablet, Take 1 tablet (81 mg total) by mouth daily. Swallow whole., Disp: , Rfl:    atorvastatin  (LIPITOR ) 40 MG tablet, Take 1 tablet (40 mg total) by mouth daily., Disp: 30 tablet, Rfl: 0   carvedilol  (COREG ) 3.125 MG tablet, Take 1 tablet (3.125 mg total) by mouth 2 (two) times daily., Disp: 60 tablet, Rfl: 1   Cholecalciferol  (VITAMIN D3) 125 MCG (5000 UT) CAPS, Take 5,000 Units by mouth daily., Disp: , Rfl:    Continuous Blood Gluc Receiver (FREESTYLE LIBRE 2 READER) DEVI, See admin instructions., Disp: , Rfl:    Continuous Blood Gluc Sensor (FREESTYLE LIBRE 14 DAY SENSOR) MISC, Inject 1 Device into the skin every 14 (fourteen) days., Disp: , Rfl:    feeding supplement (ENSURE ENLIVE / ENSURE PLUS) LIQD, Take 237 mLs by mouth 2 (two) times daily between meals., Disp: 237 mL, Rfl: 12   Glucagon HCl 1 MG SOLR, Inject 1 mg into the skin as needed (for a blood sugar <67)., Disp: , Rfl:    insulin  degludec (TRESIBA ) 200 UNIT/ML FlexTouch Pen, Inject 6 Units into the skin daily., Disp: 3 mL, Rfl: 1   levothyroxine  (SYNTHROID ) 88 MCG tablet, Take 88 mcg by mouth daily before breakfast., Disp: , Rfl:    Mouthwashes (MOUTH RINSE) LIQD solution, 15 mLs by Mouth Rinse route as needed (oral care)., Disp: , Rfl:    nitroGLYCERIN  (NITROSTAT ) 0.4 MG SL tablet, Place 1 tablet (0.4 mg total) under the tongue every 5 (five) minutes as needed for chest pain., Disp: 30 tablet, Rfl: 3   NOVOLOG  FLEXPEN 100 UNIT/ML FlexPen, Inject 5-10 Units into the skin See admin instructions. Inject 5 units into the skin before meals if BGL is 100-150, 7 units if BGL is 151-200, and 10 units if BGL is greater than 200, Disp: , Rfl:    polyethylene glycol (MIRALAX  /  GLYCOLAX ) 17 g packet, Take 17 g by mouth daily as needed for mild constipation., Disp: 14 each, Rfl: 0   sertraline  (ZOLOFT ) 50 MG tablet, Take 50 mg by mouth at bedtime., Disp: , Rfl:   Vitals   Vitals:   08/30/2024 2122 August 30, 2024 2124 Aug 30, 2024 2131  BP: (!) 160/80    Pulse: 76    Resp: 12    SpO2: 100%    Weight:   61.1 kg  Height:  5' 6 (1.676 m)     Body mass index is 21.74 kg/m.   Physical Exam   Constitutional: Elderly, frail-appearing female Eyes: No scleral injection.  HENT: No OP obstruction.  Head: Normocephalic. Neck with increased tone to rotation and flexion, similar in quality to her BUE and BLE increased tone.  Respiratory: Effort normal, non-labored breathing.    Neurologic Examination   See NIHSS  Labs/Imaging/Neurodiagnostic studies   CBC:  Recent Labs  Lab 2024-08-30 2120 08/30/24 2132  WBC 6.8  --   NEUTROABS 3.2  --   HGB 11.5* 10.9*  HCT 34.6* 32.0*  MCV 91.1  --   PLT 241  --    Basic Metabolic Panel:  Lab Results  Component Value Date   NA 142 2024/08/30   K 4.1 08-30-24   CO2 23 02/06/2024   GLUCOSE 138 (H) Aug 30, 2024   BUN 29 (H) 30-Aug-2024   CREATININE 2.10 (H) 08-30-2024   CALCIUM  8.2 (L) 02/06/2024   GFRNONAA 20 (L) 02/06/2024   GFRAA 33 (L) 01/19/2021   Lipid Panel:  Lab Results  Component  Value Date   LDLCALC 164 (H) 10/09/2023   HgbA1c:  Lab Results  Component Value Date   HGBA1C 6.1 (H) 10/08/2023   Urine Drug Screen:     Component Value Date/Time   LABOPIA NONE DETECTED 10/10/2017 1844   COCAINSCRNUR NONE DETECTED 10/10/2017 1844   LABBENZ NONE DETECTED 10/10/2017 1844   AMPHETMU NONE DETECTED 10/10/2017 1844   THCU NONE DETECTED 10/10/2017 1844   LABBARB NONE DETECTED 10/10/2017 1844    Alcohol  Level     Component Value Date/Time   ETH <10 09/12/2022 1847   INR  Lab Results  Component Value Date   INR 1.1 09/12/2022   APTT  Lab Results  Component Value Date   APTT 30 09/12/2022      ASSESSMENT  88 year old female with a PMHx as documented below (reviewed) including advanced dementia (bedbound at baseline), stroke, HTN and HLD who presents to the ED via [facility] due to syncope. Daughter reported she had a syncopal episode when moving from a chair to toilet. No head injury. Daughter reports she usually mumbles, but is alert. LSN was 2030. Initial BP was 68 systolic, HR-38 and RR 6. EMS began bagging her and paced her for about 6 minutes. HR increased to 70s. EMS also started an EPI drip en route at 4 mcg/min. She arrives to ED unresponsive and only moving her left arm per ED staff. She was able to move all 4 extremities equally to noxious in CT.  - Exam reveals findings most consistent with advanced dementia, with tonic flexion of BLE and increased tone throughout. During exam in CT, she was initially murmuring incomprehensibly to stimuli, then suddenly had arrest of all withdrawal movements to noxious, cessation of all murmuring, with arms moving to an extended and abducted position, suggestive of tonic seizure activity.  - CT head: No evidence of acute intracranial abnormality. ASPECTS is 10. Severe chronic microvascular ischemic disease.  - Labs:  - Elevated BUN and Cr with eGFR of 24.  - Na 142, K 4.1, glucose 138, ionized Ca 1.17.  - AST and ALT normal.  - WBC 6.8 - Impression: Acute AMS in a patient with advanced dementia, with associated hypotension noted after a syncopal episode. Tonic seizure activity noted in the ED. DDx for underlying etiology includes transient cerebral hypoperfusion and tonic seizure. Stroke is felt to be unlikely.   RECOMMENDATIONS  - Loading with VPA 20 mg.kg, followed by scheduled VPA at 5 mg/kg IV TID.  - IV Ativan  2 mg x 1 PRN seizure recurrence - IVF - Frequent neuro checks - Inpatient seizure precautions - EEG - If she is not back to baseline after anticonvulsant load and if EEG is negative, will need MRI brain - Continue home ASA and  atorvastatin  - Toxic/metabolic/infectious encephalopathy work up.  - Avoid deliriogenic medications.   Addendum:  - Patient re-examined. Sleeping with no evidence for seizure activity.  - Daughter in the room states that her mother has never exhibited seizure-like activity or posturing with unresponsiveness in the past.  - EEG will be performed in the morning.  ______________________________________________________________________    Bonney SHARK, Julene Rahn, MD [facility]

## 2024-08-14 ENCOUNTER — Other Ambulatory Visit: Payer: Self-pay

## 2024-08-14 ENCOUNTER — Observation Stay (HOSPITAL_COMMUNITY)

## 2024-08-14 ENCOUNTER — Inpatient Hospital Stay (HOSPITAL_COMMUNITY)

## 2024-08-14 DIAGNOSIS — Z66 Do not resuscitate: Secondary | ICD-10-CM | POA: Diagnosis present

## 2024-08-14 DIAGNOSIS — R55 Syncope and collapse: Secondary | ICD-10-CM

## 2024-08-14 DIAGNOSIS — E559 Vitamin D deficiency, unspecified: Secondary | ICD-10-CM | POA: Diagnosis present

## 2024-08-14 DIAGNOSIS — Z91040 Latex allergy status: Secondary | ICD-10-CM | POA: Diagnosis not present

## 2024-08-14 DIAGNOSIS — E785 Hyperlipidemia, unspecified: Secondary | ICD-10-CM | POA: Diagnosis present

## 2024-08-14 DIAGNOSIS — I959 Hypotension, unspecified: Secondary | ICD-10-CM | POA: Diagnosis not present

## 2024-08-14 DIAGNOSIS — G9349 Other encephalopathy: Secondary | ICD-10-CM | POA: Diagnosis present

## 2024-08-14 DIAGNOSIS — Z1152 Encounter for screening for COVID-19: Secondary | ICD-10-CM | POA: Diagnosis not present

## 2024-08-14 DIAGNOSIS — R001 Bradycardia, unspecified: Secondary | ICD-10-CM

## 2024-08-14 DIAGNOSIS — I129 Hypertensive chronic kidney disease with stage 1 through stage 4 chronic kidney disease, or unspecified chronic kidney disease: Secondary | ICD-10-CM | POA: Diagnosis present

## 2024-08-14 DIAGNOSIS — Z833 Family history of diabetes mellitus: Secondary | ICD-10-CM | POA: Diagnosis not present

## 2024-08-14 DIAGNOSIS — G40909 Epilepsy, unspecified, not intractable, without status epilepticus: Secondary | ICD-10-CM | POA: Diagnosis present

## 2024-08-14 DIAGNOSIS — Z0389 Encounter for observation for other suspected diseases and conditions ruled out: Secondary | ICD-10-CM | POA: Diagnosis not present

## 2024-08-14 DIAGNOSIS — Z794 Long term (current) use of insulin: Secondary | ICD-10-CM | POA: Diagnosis not present

## 2024-08-14 DIAGNOSIS — F0393 Unspecified dementia, unspecified severity, with mood disturbance: Secondary | ICD-10-CM | POA: Diagnosis present

## 2024-08-14 DIAGNOSIS — J9601 Acute respiratory failure with hypoxia: Secondary | ICD-10-CM | POA: Diagnosis not present

## 2024-08-14 DIAGNOSIS — Z8673 Personal history of transient ischemic attack (TIA), and cerebral infarction without residual deficits: Secondary | ICD-10-CM | POA: Diagnosis not present

## 2024-08-14 DIAGNOSIS — N184 Chronic kidney disease, stage 4 (severe): Secondary | ICD-10-CM | POA: Diagnosis present

## 2024-08-14 DIAGNOSIS — Z79890 Hormone replacement therapy (postmenopausal): Secondary | ICD-10-CM | POA: Diagnosis not present

## 2024-08-14 DIAGNOSIS — Z8249 Family history of ischemic heart disease and other diseases of the circulatory system: Secondary | ICD-10-CM | POA: Diagnosis not present

## 2024-08-14 DIAGNOSIS — G9340 Encephalopathy, unspecified: Secondary | ICD-10-CM | POA: Diagnosis not present

## 2024-08-14 DIAGNOSIS — Z7401 Bed confinement status: Secondary | ICD-10-CM | POA: Diagnosis not present

## 2024-08-14 DIAGNOSIS — E039 Hypothyroidism, unspecified: Secondary | ICD-10-CM | POA: Diagnosis present

## 2024-08-14 DIAGNOSIS — D638 Anemia in other chronic diseases classified elsewhere: Secondary | ICD-10-CM | POA: Diagnosis present

## 2024-08-14 DIAGNOSIS — R609 Edema, unspecified: Secondary | ICD-10-CM | POA: Diagnosis not present

## 2024-08-14 DIAGNOSIS — Z974 Presence of external hearing-aid: Secondary | ICD-10-CM | POA: Diagnosis not present

## 2024-08-14 DIAGNOSIS — R569 Unspecified convulsions: Secondary | ICD-10-CM | POA: Diagnosis not present

## 2024-08-14 DIAGNOSIS — Z881 Allergy status to other antibiotic agents status: Secondary | ICD-10-CM | POA: Diagnosis not present

## 2024-08-14 DIAGNOSIS — E1142 Type 2 diabetes mellitus with diabetic polyneuropathy: Secondary | ICD-10-CM | POA: Diagnosis present

## 2024-08-14 DIAGNOSIS — E1122 Type 2 diabetes mellitus with diabetic chronic kidney disease: Secondary | ICD-10-CM | POA: Diagnosis present

## 2024-08-14 LAB — BASIC METABOLIC PANEL WITH GFR
Anion gap: 12 (ref 5–15)
BUN: 25 mg/dL — ABNORMAL HIGH (ref 8–23)
CO2: 22 mmol/L (ref 22–32)
Calcium: 8.7 mg/dL — ABNORMAL LOW (ref 8.9–10.3)
Chloride: 109 mmol/L (ref 98–111)
Creatinine, Ser: 1.7 mg/dL — ABNORMAL HIGH (ref 0.44–1.00)
GFR, Estimated: 29 mL/min — ABNORMAL LOW (ref >60)
Glucose, Bld: 68 mg/dL — ABNORMAL LOW (ref 70–99)
Potassium: 4.4 mmol/L (ref 3.5–5.1)
Sodium: 143 mmol/L (ref 135–145)

## 2024-08-14 LAB — ECHOCARDIOGRAM COMPLETE
AR max vel: 1.03 cm2
AV Area VTI: 1.07 cm2
AV Area mean vel: 1.04 cm2
AV Mean grad: 11 mmHg
AV Peak grad: 21.3 mmHg
Ao pk vel: 2.31 m/s
Area-P 1/2: 3.51 cm2
Height: 66 in
S' Lateral: 2.6 cm
Weight: 2155.22 [oz_av]

## 2024-08-14 LAB — URINALYSIS, ROUTINE W REFLEX MICROSCOPIC
Bilirubin Urine: NEGATIVE
Glucose, UA: NEGATIVE mg/dL
Hgb urine dipstick: NEGATIVE
Ketones, ur: NEGATIVE mg/dL
Nitrite: NEGATIVE
Protein, ur: 100 mg/dL — AB
Specific Gravity, Urine: 1.008 (ref 1.005–1.030)
pH: 7 (ref 5.0–8.0)

## 2024-08-14 LAB — TROPONIN I (HIGH SENSITIVITY): Troponin I (High Sensitivity): 24 ng/L — ABNORMAL HIGH (ref <18)

## 2024-08-14 LAB — HEMOGLOBIN A1C
Hgb A1c MFr Bld: 5.1 % (ref 4.8–5.6)
Mean Plasma Glucose: 99.67 mg/dL

## 2024-08-14 LAB — GLUCOSE, CAPILLARY: Glucose-Capillary: 79 mg/dL (ref 70–99)

## 2024-08-14 LAB — RESP PANEL BY RT-PCR (RSV, FLU A&B, COVID)  RVPGX2
Influenza A by PCR: NEGATIVE
Influenza B by PCR: NEGATIVE
Resp Syncytial Virus by PCR: NEGATIVE
SARS Coronavirus 2 by RT PCR: NEGATIVE

## 2024-08-14 LAB — RAPID URINE DRUG SCREEN, HOSP PERFORMED
Amphetamines: NOT DETECTED
Barbiturates: NOT DETECTED
Benzodiazepines: NOT DETECTED
Cocaine: NOT DETECTED
Opiates: NOT DETECTED
Tetrahydrocannabinol: NOT DETECTED

## 2024-08-14 LAB — D-DIMER, QUANTITATIVE: D-Dimer, Quant: 3.68 ug{FEU}/mL — ABNORMAL HIGH (ref 0.00–0.50)

## 2024-08-14 LAB — IRON AND TIBC
Iron: 48 ug/dL (ref 28–170)
Saturation Ratios: 26 % (ref 10.4–31.8)
TIBC: 188 ug/dL — ABNORMAL LOW (ref 250–450)
UIBC: 140 ug/dL

## 2024-08-14 LAB — ETHANOL: Alcohol, Ethyl (B): <15 mg/dL (ref <15)

## 2024-08-14 LAB — CBG MONITORING, ED
Glucose-Capillary: 87 mg/dL (ref 70–99)
Glucose-Capillary: 59 mg/dL — ABNORMAL LOW (ref 70–99)
Glucose-Capillary: 96 mg/dL (ref 70–99)

## 2024-08-14 LAB — FERRITIN: Ferritin: 161 ng/mL (ref 11–307)

## 2024-08-14 MED ORDER — SODIUM CHLORIDE 0.9 % IV SOLN: INTRAVENOUS | Status: AC

## 2024-08-14 MED ORDER — LEVOTHYROXINE SODIUM 88 MCG PO TABS
88.0000 ug | ORAL_TABLET | Freq: Every day | ORAL | Status: DC
  Administered 2024-08-15: 88 ug via ORAL
  Filled 2024-08-14: qty 1

## 2024-08-14 MED ORDER — ASPIRIN 81 MG PO TBEC
81.0000 mg | DELAYED_RELEASE_TABLET | Freq: Every day | ORAL | Status: DC
  Administered 2024-08-14 – 2024-08-15 (×2): 81 mg via ORAL
  Filled 2024-08-14 (×2): qty 1

## 2024-08-14 MED ORDER — ACETAMINOPHEN 650 MG RE SUPP: 650.0000 mg | Freq: Four times a day (QID) | RECTAL | Status: DC | PRN

## 2024-08-14 MED ORDER — HEPARIN SODIUM (PORCINE) 5000 UNIT/ML IJ SOLN
5000.0000 [IU] | Freq: Three times a day (TID) | INTRAMUSCULAR | Status: DC
  Administered 2024-08-14 – 2024-08-15 (×4): 5000 [IU] via SUBCUTANEOUS
  Filled 2024-08-14 (×4): qty 1

## 2024-08-14 MED ORDER — DEXTROSE 50 % IV SOLN
12.5000 g | Freq: Once | INTRAVENOUS | Status: AC
  Administered 2024-08-14: 12.5 g via INTRAVENOUS
  Filled 2024-08-14: qty 50

## 2024-08-14 MED ORDER — INSULIN ASPART 100 UNIT/ML IJ SOLN: 0.0000 [IU] | INTRAMUSCULAR | Status: DC

## 2024-08-14 MED ORDER — AMLODIPINE BESYLATE 5 MG PO TABS
10.0000 mg | ORAL_TABLET | Freq: Every day | ORAL | Status: DC
  Administered 2024-08-14 – 2024-08-15 (×2): 10 mg via ORAL
  Filled 2024-08-14 (×2): qty 2

## 2024-08-14 MED ORDER — ASPIRIN 300 MG RE SUPP
300.0000 mg | Freq: Every day | RECTAL | Status: DC
Start: 2024-08-14 — End: 2024-08-14
  Administered 2024-08-14: 300 mg via RECTAL
  Filled 2024-08-14: qty 1

## 2024-08-14 MED ORDER — TECHNETIUM TO 99M ALBUMIN AGGREGATED
4.4000 | Freq: Once | INTRAVENOUS | Status: AC
Start: 2024-08-14 — End: 2024-08-14
  Administered 2024-08-14: 4.4 via INTRAVENOUS

## 2024-08-14 MED ORDER — ENSURE ENLIVE PO LIQD
237.0000 mL | Freq: Two times a day (BID) | ORAL | Status: DC
  Administered 2024-08-15: 237 mL via ORAL
  Filled 2024-08-14 (×2): qty 237

## 2024-08-14 MED ORDER — HYDRALAZINE HCL 20 MG/ML IJ SOLN: 5.0000 mg | Freq: Three times a day (TID) | INTRAMUSCULAR | Status: DC | PRN

## 2024-08-14 MED ORDER — SERTRALINE HCL 50 MG PO TABS
50.0000 mg | ORAL_TABLET | Freq: Every day | ORAL | Status: DC
  Administered 2024-08-14: 50 mg via ORAL
  Filled 2024-08-14: qty 1

## 2024-08-14 NOTE — Progress Notes (Signed)
 [name] Liaison Note   [name] is a current patient with [facility] with a terminal diagnosis of cerebrovascular disease. She presented to the ED for evaluation after a syncopal episode at home. She arrived unresponsive and had tonic seizure activity in the ED. She was started on IV Depakote. She was admitted 09.15.2025 for observation.  Per Dr. Norleen Sella with [facility] this is a related hospital admission. Patient is a DNR.   Visited with patient and daughter Diana Harper at the bedside. She said that the patient was more alert today but still lethargic. She stated that they are running a lot of tests on her Mom and is hoping that she can go back home soon. Let her know that [facility] will be following while the patient is in the hospital and to please reach out with any questions.      Patient remains inpatient appropriate for skilled monitoring and diagnostics workup.    V/S: 98.9, 1142/127, 72, 15, Sats 98% on 2L [place]   I/O: not recorded   Abnormal Labs 09.16: BUN 25 Cr 1.7 GFR 29 Trop1 24  Diagnostics: CT head showed remote bilateral basal ganglia lacunar infarct. EEG showed no seizures   IV/PRN medications: hydralazine  5mg  inj. Q8H PRN x0 Tylenol  650 mg suppository Q6H PRN x0.     Assessment and plan per Dr. Arlin Edis (neurology) note on 9.16:   Subjective: No acute events overnight.  No further seizure-like episodes.  Per daughter at bedside, patient usually has constipation and previous episodes of vasovagal syncope.  However recently she was started on sorbitol and has been taking it daily leading to loose stools.  Denies any previous episodes of seizures.  ROS: negative except above   Examination   Vital signs in last 24 hours: Temp:  [98.6 F (37 C)-99.9 F (37.7 C)] 99.9 F (37.7 C) (09/16 0654) Pulse Rate:  [48-97] 97 (09/16 0600) Resp:  [12-22] 21 (09/16 0600) BP: (142-185)/(45-136)  185/118 (09/16 0600) SpO2:  [98 %-100 %] 100 % (09/16 0600) Weight:  [61.1 kg] 61.1 kg (09/15 2131)   General: lying in bed, NAD Neuro: Opens eyes to verbal stimulation, able to tell me her name and recognize her daughter, very slow to respond but does follow commands, PERRLA, antigravity strength in bilateral upper and lower extremities   Basic Metabolic Panel: Last Labs      Recent Labs  Lab 08/13/24 2120 08/13/24 2132  NA 141 142  K 4.5 4.1  CL 106 108  CO2 24  --   GLUCOSE 145* 138*  BUN 30* 29*  CREATININE 1.99* 2.10*  CALCIUM  9.2  --         CBC: Last Labs      Recent Labs  Lab 08/13/24 2120 08/13/24 2132  WBC 6.8  --   NEUTROABS 3.2  --   HGB 11.5* 10.9*  HCT 34.6* 32.0*  MCV 91.1  --   PLT 241  --           Coagulation Studies: Recent Labs (last 2 labs)     Recent Labs    08/13/24 2120  LABPROT 14.2  INR 1.0        Imaging personally reviewed   CT head without contrast 08/13/2024: No evidence of acute large vascular territory infarction,hemorrhage, hydrocephalus, extra-axial collection or mass lesion/mass effect. Similar severe patchy and confluent white matter hypodensities, compatible chronic microvascular ischemic disease. Remote bilateral basal ganglia lacunar infarct.  ASSESSMENT AND PLAN: 88 year old female with multiple medical comorbidities, prior strokes on aspirin  presented with an episode of syncope as well as seizure-like episode   Syncope Seizure like episode - Likely orthostatic syncope -Patient had seizure-like episode while in the ER which could have been due to convulsive syncope - Because this was her first episode, she does not have cortical strokes and EEG did not show any evidence of epileptogenicity, I will avoid starting seizure medications for now - I considered obtaining MRI brain without contrast.  However after discussion with daughter, patient is unable to lay still for the duration of the MRI, it is not worth  sedating the patient as patient is already improving and pretty close to her baseline per daughter at bedside - Continue Aspir 81 milligrams daily for secondary stroke prevention - Rest of the management per primary team - Discussed plan with hospitalist via secure chat   Discharge Planning: possible in hospital death vs transfer to [facility] or LTC with continued hospice services.   Family contact: patient's daughter Bobbette [phone]   IDT: updated   Goals of care: clear, comfort focused care, DNR   Should patient need ambulance transport, please use [facility] as they contract this service for our active hospice patients.   Please call with any hospice questions or concerns.   Greig Basket, BSN, RN [facility] Liaison [phone]

## 2024-08-14 NOTE — ED Notes (Signed)
 Phlebotomy asked to stick

## 2024-08-14 NOTE — Progress Notes (Signed)
  Echocardiogram 2D Echocardiogram has been performed.  Diana Harper 08/14/2024, 12:02 PM

## 2024-08-14 NOTE — Evaluation (Signed)
 Clinical/Bedside Swallow Evaluation Patient Details  Name: Diana Harper MRN: [id number] Date of Birth: Nov 07, 1936  Today's Date: 08/14/2024 Time: SLP Start Time (ACUTE ONLY): 0950 SLP Stop Time (ACUTE ONLY): 1000 SLP Time Calculation (min) (ACUTE ONLY): 10 min  Past Medical History:  Past Medical History:  Diagnosis Date   Anemia    Anxiety    Arthritis    Asthma    Biliary colic    C. difficile colitis 08/2022   Cholelithiasis    CKD (chronic kidney disease), stage III [facility])    nephrologist--  dr oliver   Colon polyps    Diverticulosis of colon    Gastric ulcer    Glaucoma    History of asthma    1980's  no longer problem since 1980's   History of diverticulitis of colon    recurrent--  2014;  2013;  2012   Hyperlipidemia    Hypertension    Hypothyroidism    Insulin  dependent type 2 diabetes mellitus (HCC)    followed by dr norleen lambeth ([facility])    Murmur    Peripheral neuropathy    Stroke [facility])    Vitamin D  deficiency    Wears hearing aid    bilateral   Wears partial dentures    lower partial and upper full   Past Surgical History:  Past Surgical History:  Procedure Laterality Date   ABDOMINAL HYSTERECTOMY  1970's   CARDIOVASCULAR STRESS TEST  07/30/2009   normal exercise lexiscan  nuclear study w/ no ischemia/  normal LV funciton and wall motion, ef 77%   CATARACT EXTRACTION W/ INTRAOCULAR LENS  IMPLANT, BILATERAL  2014   CATARACT EXTRACTION, BILATERAL Bilateral 2021   LAPAROSCOPIC CHOLECYSTECTOMY SINGLE SITE WITH INTRAOPERATIVE CHOLANGIOGRAM N/A 08/25/2016   Procedure: LAPAROSCOPIC CHOLECYSTECTOMY WITH INTRAOPERATIVE CHOLANGIOGRAM;  Surgeon: Herlene Beverley Bureau, MD;  Location: [facility] [place];  Service: General;  Laterality: N/A;   LUMBAR SPINE SURGERY  2014   REMOVAL AXILLA CYST Right 1990's   HPI:  Diana Harper is an 88 yo female who presented from home, where she lives with her daughter, to [facility] on 9/16 due to syncopal episode.  PMHx:  prior CVA, type 2 diabetes, essential hypertension, hyperlipidemia, hypothyroidism,  C. difficile colitis, advanced dementia, CKD III, hx of vasovagal syncope.  Her daughter purees her food in a blender and feeds her.  She tolerates thin liquids well. She is followed by [facility].    Assessment / Plan / Recommendation  Clinical Impression  Pt presents with baseline, functional swallowing with no concerns for aspiration. She consumed sequential sips of thin liquid from a straw and swallowed several bites of applesauce without incident; voice was clear; no coughing. Daughter at bedside. Eval was brief due to EEG staff present to complete assessment.  Pt with no s/s of an advancing dysphagia.  Continue pureed diet, thin liquids. No SLP f/u needed. SLP Visit Diagnosis: Dysphagia, oral phase (R13.11)    Aspiration Risk  No limitations    Diet Recommendation   Thin;Dysphagia 1 (puree)  Medication Administration: Whole meds with puree    Other  Recommendations Oral Care Recommendations: Oral care BID       Swallow Study   General Date of Onset: 08/14/24 HPI: Diana Harper is an 88 yo female who presented from home, where she lives with her daughter, to [facility] on 9/16 due to syncopal episode.  PMHx: prior CVA, type 2 diabetes, essential hypertension, hyperlipidemia, hypothyroidism,  C. difficile colitis, advanced dementia, CKD III, hx of vasovagal  syncope.  Her daughter purees her food in a blender and feeds her.  She tolerates thin liquids well. She is followed by [facility]. Type of Study: Bedside Swallow Evaluation Previous Swallow Assessment: Nov 2024 clinical swallow eval, d/cd on dys1/thin Diet Prior to this Study: NPO Temperature Spikes Noted: No Respiratory Status: Room air History of Recent Intubation: No Behavior/Cognition: Alert Oral Cavity Assessment: Within Functional Limits Oral Care Completed by SLP: No Oral Cavity - Dentition: Edentulous Self-Feeding Abilities: Total  assist Patient Positioning: Upright in bed Baseline Vocal Quality: Normal Volitional Cough: Cognitively unable to elicit Volitional Swallow: Unable to elicit    Oral/Motor/Sensory Function Overall Oral Motor/Sensory Function: Within functional limits   Ice Chips Ice chips: Not tested   Thin Liquid Thin Liquid: Within functional limits    Nectar Thick Nectar Thick Liquid: Not tested   Honey Thick Honey Thick Liquid: Not tested   Puree Puree: Within functional limits   Solid     Solid: Not tested      Diana Harper 08/14/2024,10:15 AM  Palma L. Vona, MA CCC/SLP Clinical Specialist - Acute Care SLP Acute Rehabilitation Services Office number [phone]

## 2024-08-14 NOTE — ED Notes (Signed)
 Patien was soiled. Patient's full linen changed with family assistance.

## 2024-08-14 NOTE — Procedures (Signed)
 Patient Name: Diana Harper  MRN: [id number]  Epilepsy Attending: Arlin MALVA Krebs  Referring Physician/Provider: Merrianne Locus, MD  Date: 08/14/2024 Duration: 21.06 mins  Patient history: 88 year old female with syncope followed by seizure-like episode.  EEG to evaluate for seizure.  Level of alertness: Awake  AEDs during EEG study: None  Technical aspects: This EEG study was done with scalp electrodes positioned according to the 10-20 International system of electrode placement. Electrical activity was reviewed with band pass filter of 1-70Hz , sensitivity of 7 uV/mm, display speed of 37mm/sec with a 60Hz  notched filter applied as appropriate. EEG data were recorded continuously and digitally stored.  Video monitoring was available and reviewed as appropriate.  Description: EEG showed continuous generalized 3 to 6 Hz theta-delta slowing. Hyperventilation and photic stimulation were not performed.     ABNORMALITY - Continuous slow, generalized  IMPRESSION: This study is suggestive of moderate diffuse encephalopathy. No seizures or epileptiform discharges were seen throughout the recording.  Yurianna Tusing O Zarielle Cea

## 2024-08-14 NOTE — ED Notes (Signed)
 Per nurse send dark green (collected earlier 2120) to main lab for ethanol.  KM

## 2024-08-14 NOTE — ED Notes (Signed)
 CCMD called.

## 2024-08-14 NOTE — Progress Notes (Signed)
 [name] liaison note     This patient is a current hospice patient with [facility]. Please see media tab for detailed hospice report.    Liaison will continue to follow for any discharge planning needs and to coordinate continuation of hospice care.    Please don't hesitate to call with any Hospice related questions or concerns.    Thank you for the opportunity to participate in this patient's care.  Greig Basket, BSN, RN [facility] Liaison [phone]

## 2024-08-14 NOTE — Progress Notes (Signed)
 Subjective: No acute events overnight.  No further seizure-like episodes.  Per daughter at bedside, patient usually has constipation and previous episodes of vasovagal syncope.  However recently she was started on sorbitol and has been taking it daily leading to loose stools.  Denies any previous episodes of seizures.  ROS: negative except above  Examination  Vital signs in last 24 hours: Temp:  [98.6 F (37 C)-99.9 F (37.7 C)] 99.9 F (37.7 C) (09/16 0654) Pulse Rate:  [48-97] 97 (09/16 0600) Resp:  [12-22] 21 (09/16 0600) BP: (142-185)/(45-136) 185/118 (09/16 0600) SpO2:  [98 %-100 %] 100 % (09/16 0600) Weight:  [61.1 kg] 61.1 kg (09/15 2130/09/19)  General: lying in bed, NAD Neuro: Opens eyes to verbal stimulation, able to tell me her name and recognize her daughter, very slow to respond but does follow commands, PERRLA, antigravity strength in bilateral upper and lower extremities  Basic Metabolic Panel: Recent Labs  Lab 08/13/24 20-Sep-2119 08/13/24 September 20, 2131  NA 141 142  K 4.5 4.1  CL 106 108  CO2 24  --   GLUCOSE 145* 138*  BUN 30* 29*  CREATININE 1.99* 2.10*  CALCIUM  9.2  --     CBC: Recent Labs  Lab 08/13/24 Sep 20, 2119 08/13/24 09-20-31  WBC 6.8  --   NEUTROABS 3.2  --   HGB 11.5* 10.9*  HCT 34.6* 32.0*  MCV 91.1  --   PLT 241  --      Coagulation Studies: Recent Labs    08/13/24 Sep 20, 2119  LABPROT 14.2  INR 1.0    Imaging personally reviewed  CT head without contrast 08/13/2024: No evidence of acute large vascular territory infarction,hemorrhage, hydrocephalus, extra-axial collection or mass lesion/mass effect. Similar severe patchy and confluent white matter hypodensities, compatible chronic microvascular ischemic disease. Remote bilateral basal ganglia lacunar infarct.   ASSESSMENT AND PLAN: 88 year old female with multiple medical comorbidities, prior strokes on aspirin  presented with an episode of syncope as well as seizure-like episode  Syncope Seizure like episode -  Likely orthostatic syncope -Patient had seizure-like episode while in the ER which could have been due to convulsive syncope - Because this was her first episode, she does not have cortical strokes and EEG did not show any evidence of epileptogenicity, I will avoid starting seizure medications for now - I considered obtaining MRI brain without contrast.  However after discussion with daughter, patient is unable to lay still for the duration of the MRI, it is not worth sedating the patient as patient is already improving and pretty close to her baseline per daughter at bedside - Continue Aspir 81 milligrams daily for secondary stroke prevention - Rest of the management per primary team - Discussed plan with hospitalist via secure chat    I personally spent a total of 45 minutes in the care of the patient today including getting/reviewing separately obtained history, performing a medically appropriate exam/evaluation, counseling and educating, placing orders, referring and communicating with other health care professionals, documenting clinical information in the EHR, independently interpreting results, and coordinating care.          Arlin Krebs Epilepsy [facility] For questions after 5pm please refer to AMION to reach the Neurologist on call

## 2024-08-14 NOTE — ED Notes (Signed)
 Rolling call given, pt is coming upstairs with RN transport.

## 2024-08-14 NOTE — Progress Notes (Signed)
 EEG complete. Results pending.  ?

## 2024-08-14 NOTE — Progress Notes (Addendum)
   Follow Up Note  HPI Pt admitted earlier this morning, please refer to full H&P done Briefly 88 year old female with history of advanced dementia who is bedbound at baseline, history of CVA, CKD stage IV, diabetes mellitus type 2, vasovagal syncope presents to the ED for evaluation of syncope. En route via EMS, noted to be bradycardic.  While in the ED, noted to have a tonic-clonic seizure (no previous history of seizures). CT head unremarkable for any acute intracranial abnormality.  Neurology consulted, recommended EEG and started patient on IV Depacon .  Patient admitted for further management.     Today, saw patient in the ED, daughter at bedside, discussed extensively.  Patient still appeared lethargic/weak possibly postictal, although did not appear to be in any distress, was intermittently awake.  Extensive workup done unremarkable.  Echo done showed EF of 60 to 65% grade 1 diastolic dysfunction.  D-dimer elevated, VQ scan negative for any PE.  Bilateral Doppler pending.  Orthostatic vitals pending.  Patient also noted to be initially bradycardic, PTA Coreg  on hold (may consider discontinuing or reducing dose upon discharge).  Neurology on board, likely orthostatic syncope, in addition to convulsive syncope.  Since EEG is negative no need for seizure medication upon discharge.  Exam: General: NAD, chronically ill-appearing Cardiovascular: S1, S2 present Respiratory: CTAB Abdomen: Soft, nontender, nondistended, bowel sounds present Musculoskeletal: No bilateral pedal edema noted Skin: Normal Psychiatry: Unable to assess     Disposition: Plan for possible DC on 9/17 home with family if stable

## 2024-08-14 NOTE — H&P (Signed)
 History and Physical    Diana Harper FMW:[id number] DOB: Feb 02, 1936 DOA: 08/13/2024  PCP: Waylan Almarie SAUNDERS, MD  Patient coming from: Home  Chief Complaint: Syncope  HPI: Diana Harper is a 88 y.o. female with medical history significant of advanced dementia (bedbound at baseline), CVA, anemia, anxiety, arthritis, asthma, CKD stage IV, glaucoma, hypertension, hyperlipidemia, hypothyroidism, insulin -dependent type 2 diabetes, peripheral neuropathy, recurrent vasovagal syncope presents to the ED via EMS for evaluation of syncope.  Daughter reported to EMS that patient had a syncopal episode when moving from a chair to the toilet.  No head injury reported.  Daughter reported that patient usually mumbles but is alert.  LKW 2030.  Initial SBP 68 with EMS, heart rate 38, and respiratory rate 6.  EMS provided BVM ventilations en route.  Temporarily paced for about 15 minutes and required epinephrine  drip for 15 minutes.  Heart rate subsequently improved to 70s.  Patient arrived to the ED unresponsive and only moving her left arm.  Code stroke activated on arrival and patient was seen by neurology.  CT head showing no acute intracranial abnormality.  Upon evaluation by neurology, patient noted to have tonic seizure activity in the ED.  Neurology felt that stroke is unlikely.  Recommended admission for EEG and patient was started on IV Depacon  1250 mg loading dose followed by 300 mg every 8 hours.   Labs showing no leukocytosis, hemoglobin 11.5 (at baseline), MCV 91.1, sodium within normal range, glucose 145, creatinine 2.0 (at baseline), UDS pending, ethanol level pending.  Chest x-ray showing no active disease.  Patient was also given 1 L LR.  EDP discussed CODE STATUS with the patient's daughter and she is DNR (pre-arrest interventions including intubation desired).  TRH called to admit.  Patient is somnolent.  History provided by her daughter Kayti Poss at bedside who states that patient has advanced  dementia and at baseline she is bedbound.  She mumbles but is normally alert.  Normally when family helps the patient transfer from her bed to the commode, she is able to participate.  However, this changed yesterday just prior to ED arrival when her sister tried to transfer the patient to the commode, she became completely limp and unresponsive so family had to lower her to the ground and EMS was called.  Daughter states patient takes sorbitol as needed for constipation and is having bowel movements.  No vomiting or abdominal pain reported.  No fevers, cough, or shortness of breath reported.  No history of seizures reported.  Review of Systems:  Review of Systems  Reason unable to perform ROS: AMS.    Past Medical History:  Diagnosis Date   Anemia    Anxiety    Arthritis    Asthma    Biliary colic    C. difficile colitis 08/2022   Cholelithiasis    CKD (chronic kidney disease), stage III [facility])    nephrologist--  dr oliver   Colon polyps    Diverticulosis of colon    Gastric ulcer    Glaucoma    History of asthma    1980's  no longer problem since 1980's   History of diverticulitis of colon    recurrent--  2014;  2013;  2012   Hyperlipidemia    Hypertension    Hypothyroidism    Insulin  dependent type 2 diabetes mellitus (HCC)    followed by dr norleen lambeth ([facility])    Murmur    Peripheral neuropathy    Stroke (HCC)  Vitamin D  deficiency    Wears hearing aid    bilateral   Wears partial dentures    lower partial and upper full    Past Surgical History:  Procedure Laterality Date   ABDOMINAL HYSTERECTOMY  1970's   CARDIOVASCULAR STRESS TEST  07/30/2009   normal exercise lexiscan  nuclear study w/ no ischemia/  normal LV funciton and wall motion, ef 77%   CATARACT EXTRACTION W/ INTRAOCULAR LENS  IMPLANT, BILATERAL  2014   CATARACT EXTRACTION, BILATERAL Bilateral 2021   LAPAROSCOPIC CHOLECYSTECTOMY SINGLE SITE WITH INTRAOPERATIVE CHOLANGIOGRAM N/A 08/25/2016    Procedure: LAPAROSCOPIC CHOLECYSTECTOMY WITH INTRAOPERATIVE CHOLANGIOGRAM;  Surgeon: Herlene Righter Kinsinger, MD;  Location: [facility] [place];  Service: General;  Laterality: N/A;   LUMBAR SPINE SURGERY  2014   REMOVAL AXILLA CYST Right 1990's     reports that she has never smoked. She has never used smokeless tobacco. She reports that she does not drink alcohol  and does not use drugs.  Allergies  Allergen Reactions   Latex Anaphylaxis, Shortness Of Breath and Other (See Comments)    Severe respiratory distress   Onion Diarrhea   Other Other (See Comments)    Patient cannot have TREE NUTS and anything with seeds- History of  diverticulitis    Flexeril  [Cyclobenzaprine ] Other (See Comments)    Caused agitation   Lisinopril Cough   Metformin And Related Diarrhea   Simvastatin Hives and Rash   Tetracycline Rash   Zithromax [Azithromycin] Rash    Family History  Problem Relation Age of Onset   Stroke Mother    Colon polyps Mother    Diabetes Mother    Diabetes Father    Kidney failure Father    COPD Father    Colon polyps Sister    Diabetes Sister    Stroke Brother    Diabetes Brother    Heart attack Maternal Grandmother    Heart disease Maternal Grandfather    Diabetes Paternal Grandmother    Stroke Paternal Grandfather    Hypertension Paternal Grandfather    Diabetes Sister    Diabetes Sister    Diabetes Son    Diabetes Daughter    Colon cancer Neg Hx    Esophageal cancer Neg Hx    Inflammatory bowel disease Neg Hx    Liver disease Neg Hx    Pancreatic cancer Neg Hx    Rectal cancer Neg Hx    Stomach cancer Neg Hx     Prior to Admission medications   Medication Sig Start Date End Date Taking? Authorizing Provider  acetaminophen  (TYLENOL ) 500 MG tablet Take 1,000 mg by mouth daily as needed for mild pain (pain score 1-3).   Yes [provider]  ALPRAZolam  (XANAX ) 0.5 MG tablet Take 0.25 mg by mouth at bedtime. 07/31/24  Yes [provider]   amLODipine  (NORVASC ) 10 MG tablet Take 1 tablet (10 mg total) by mouth daily. 02/07/24  Yes Rosario Eland I, MD  amLODipine  (NORVASC ) 5 MG tablet Take 5 mg by mouth daily. 05/24/24  Yes [provider]  aspirin  EC 81 MG tablet Take 1 tablet (81 mg total) by mouth daily. Swallow whole. 10/15/23  Yes Vann, Jessica U, DO  carvedilol  (COREG ) 3.125 MG tablet Take 1 tablet (3.125 mg total) by mouth 2 (two) times daily. Patient taking differently: Take 6.25 mg by mouth daily. 02/07/24  Yes Rosario Eland I, MD  Cholecalciferol  (VITAMIN D3) 125 MCG (5000 UT) CAPS Take 5,000 Units by mouth daily.   Yes  [provider]  feeding supplement (ENSURE ENLIVE / ENSURE PLUS) LIQD Take 237 mLs by mouth 2 (two) times daily between meals. 02/07/24  Yes Rosario Eland I, MD  haloperidol  (HALDOL ) 5 MG tablet Take 5 mg by mouth at bedtime. 07/03/24  Yes [provider]  insulin  degludec (TRESIBA ) 200 UNIT/ML FlexTouch Pen Inject 6 Units into the skin daily. Patient taking differently: Inject 4-16 Units into the skin daily. 02/07/24  Yes Rosario Eland FERNS, MD  levothyroxine  (SYNTHROID ) 88 MCG tablet Take 88 mcg by mouth daily before breakfast.   Yes [provider]  sertraline  (ZOLOFT ) 50 MG tablet Take 50 mg by mouth at bedtime.   Yes [provider]  sorbitol 70 % solution Take 15-30 mLs by mouth daily as needed (for constipation). 08/09/24  Yes [provider]  Continuous Blood Gluc Receiver (FREESTYLE LIBRE 2 READER) DEVI See admin instructions. 10/13/22   [provider]  Continuous Blood Gluc Sensor (FREESTYLE LIBRE 14 DAY SENSOR) MISC Inject 1 Device into the skin every 14 (fourteen) days.    [provider]  Glucagon HCl 1 MG SOLR Inject 1 mg into the skin as needed (for a blood sugar <67). Patient not taking: Reported on 08/13/2024    [provider]  nitroGLYCERIN  (NITROSTAT ) 0.4 MG SL tablet Place 1 tablet (0.4 mg total) under  the tongue every 5 (five) minutes as needed for chest pain. Patient not taking: Reported on 08/13/2024 01/20/21 10/07/24  Thomasena Sherran JAYSON DEVONNA    Physical Exam: Vitals:   08/13/24 2300 08/14/24 0000 08/14/24 0130 08/14/24 0145  BP:  (!) 154/131 (!) 153/66 (!) 159/52  Pulse: 74 (!) 52 (!) 58 (!) 52  Resp:  17    SpO2:  100% 100% 100%  Weight:      Height:        Physical Exam Vitals reviewed.  Constitutional:      General: She is not in acute distress. HENT:     Head: Normocephalic and atraumatic.  Eyes:     Comments: Unable to examine due to lack of patient cooperation  Cardiovascular:     Rate and Rhythm: Regular rhythm. Bradycardia present.     Heart sounds: Murmur heard.  Pulmonary:     Effort: Pulmonary effort is normal. No respiratory distress.     Breath sounds: No stridor. No wheezing, rhonchi or rales.  Abdominal:     General: Bowel sounds are normal. There is no distension.     Palpations: Abdomen is soft.     Tenderness: There is no abdominal tenderness. There is no guarding.     Comments: Examination limited due to patient lying in right lateral decubitus position  Musculoskeletal:     Cervical back: Normal range of motion.     Right lower leg: No edema.     Left lower leg: No edema.  Skin:    General: Skin is warm and dry.  Neurological:     Comments: Somnolent, does not follow commands     Labs on Admission: I have personally reviewed following labs and imaging studies  CBC: Recent Labs  Lab 08/13/24 2120 08/13/24 2132  WBC 6.8  --   NEUTROABS 3.2  --   HGB 11.5* 10.9*  HCT 34.6* 32.0*  MCV 91.1  --   PLT 241  --    Basic Metabolic Panel: Recent Labs  Lab 08/13/24 2120 08/13/24 2132  NA 141 142  K 4.5 4.1  CL 106 108  CO2  24  --   GLUCOSE 145* 138*  BUN 30* 29*  CREATININE 1.99* 2.10*  CALCIUM  9.2  --    GFR: Estimated Creatinine Clearance: 18.3 mL/min (A) (by C-G formula based on SCr of 2.1 mg/dL (H)). Liver Function  Tests: Recent Labs  Lab 08/13/24 2120  AST 25  ALT 17  ALKPHOS 52  BILITOT 0.4  PROT 6.2*  ALBUMIN  3.3*   No results for input(s): LIPASE, AMYLASE in the last 168 hours. No results for input(s): AMMONIA in the last 168 hours. Coagulation Profile: Recent Labs  Lab 08/13/24 2120  INR 1.0   Cardiac Enzymes: No results for input(s): CKTOTAL, CKMB, CKMBINDEX, TROPONINI in the last 168 hours. BNP (last 3 results) No results for input(s): PROBNP in the last 8760 hours. HbA1C: No results for input(s): HGBA1C in the last 72 hours. CBG: Recent Labs  Lab 08/13/24 2120  GLUCAP 130*   Lipid Profile: No results for input(s): CHOL, HDL, LDLCALC, TRIG, CHOLHDL, LDLDIRECT in the last 72 hours. Thyroid  Function Tests: No results for input(s): TSH, T4TOTAL, FREET4, T3FREE, THYROIDAB in the last 72 hours. Anemia Panel: No results for input(s): VITAMINB12, FOLATE, FERRITIN, TIBC, IRON, RETICCTPCT in the last 72 hours. Urine analysis:    Component Value Date/Time   COLORURINE YELLOW 02/05/2024 2222   APPEARANCEUR HAZY (A) 02/05/2024 2222   LABSPEC 1.018 02/05/2024 2222   PHURINE 5.0 02/05/2024 2222   GLUCOSEU NEGATIVE 02/05/2024 2222   HGBUR SMALL (A) 02/05/2024 2222   BILIRUBINUR NEGATIVE 02/05/2024 2222   KETONESUR NEGATIVE 02/05/2024 2222   PROTEINUR >=300 (A) 02/05/2024 2222   UROBILINOGEN 0.2 04/15/2012 1217   NITRITE NEGATIVE 02/05/2024 2222   LEUKOCYTESUR SMALL (A) 02/05/2024 2222    Radiological Exams on Admission: DG Chest Portable 1 View Result Date: 08/13/2024 CLINICAL DATA:  Recent syncopal episode EXAM: PORTABLE CHEST 1 VIEW COMPARISON:  02/06/2024 FINDINGS: Cardiac shadow is within normal limits. Lungs are well aerated bilaterally. No focal infiltrate or effusion is seen. No bony abnormality is noted. IMPRESSION: No active disease. Electronically Signed   By: Oneil Devonshire M.D.   On: 08/13/2024 22:13   CT HEAD CODE  STROKE WO CONTRAST Result Date: 08/13/2024 CLINICAL DATA:  Code stroke.  Neuro deficit, acute, stroke suspected EXAM: CT HEAD WITHOUT CONTRAST TECHNIQUE: Contiguous axial images were obtained from the base of the skull through the vertex without intravenous contrast. RADIATION DOSE REDUCTION: This exam was performed according to the departmental dose-optimization program which includes automated exposure control, adjustment of the mA and/or kV according to patient size and/or use of iterative reconstruction technique. COMPARISON:  CT head 11/30/2023 FINDINGS: Brain: No evidence of acute large vascular territory infarction, hemorrhage, hydrocephalus, extra-axial collection or mass lesion/mass effect. Similar severe patchy and confluent white matter hypodensities, compatible chronic microvascular ischemic disease. Remote bilateral basal ganglia lacunar infarct. Vascular: No hyperdense vessel identified. Calcific atherosclerosis. Skull: No acute fracture. Sinuses/Orbits: Clear sinuses.  No acute orbital findings. Other: No mastoid effusions. ASPECTS [facility] Stroke Program Early CT Score) Total score (0-10 with 10 being normal): 10 IMPRESSION: 1. No evidence of acute intracranial abnormality. ASPECTS is 10. 2. Severe chronic microvascular ischemic disease. Code stroke imaging results were communicated on 08/13/2024 at 9:44 pm to provider Dr. Lindzen via secure text paging. Electronically Signed   By: Gilmore GORMAN Molt M.D.   On: 08/13/2024 21:45    EKG: Independently reviewed.  Sinus rhythm, artifact.  No significant change since previous tracing.  Assessment and Plan  Acute encephalopathy in setting of  baseline advanced dementia CT head showing no acute intracranial abnormality.  Neurology suspecting transient cerebral hypoperfusion versus tonic seizure.  Stroke is felt to be unlikely.  -Continue IV Depacon  -IV Ativan  2 mg x 1 PRN seizure recurrence -IV fluid hydration -Frequent neurochecks -Seizure  precautions -EEG -If she is not back to baseline after anticonvulsant load and if EEG is negative, will need brain MRI per neurology -Avoid deliriogenic medications  Syncope In the setting of suspected seizure although she does have history of recurrent vasovagal syncope.  Bradycardic and hypotensive initially but now improved.  Seizure workup as above.  Continue cardiac monitoring.  She does have a murmur on exam and echocardiogram ordered.  UDS pending.  Sinus bradycardia Heart rate has now improved to 50-60s.  Blood pressure improved.  Hold home Coreg  and continue cardiac monitoring.  Hypotension In the setting of severe bradycardia initially.  Heart rate and blood pressure now improved.  Hemoglobin at baseline and no signs of bleeding.  No leukocytosis or obvious signs of infection.  Chest x-ray not suggestive of pneumonia.  UA ordered to rule out UTI.  Holding home antihypertensives at this time.  Acute hypoxemic respiratory failure In the setting of problems listed above.  Initially required BVM ventilations by EMS on the way to the ED.  Chest x-ray showing no active disease.  Currently stable on 4 L [place].  PE less likely given no tachycardia or clinical signs of DVT on exam.  However, given hypotension and syncope, will check D-dimer and if elevated will need VQ scan to rule out PE.  Avoid CTA due to CKD stage IV.  Continue supplemental oxygen, wean as tolerated.  History of stroke Patient is quite somnolent and not able to safely take any p.o. meds at this time.  Rectal aspirin  ordered.  Chronic normocytic anemia Hemoglobin at baseline.  CKD stage IV Creatinine at baseline.  Insulin -dependent type 2 diabetes A1c 6.1 in November 2024, repeat ordered.  Holding home long-acting insulin  at this time as patient is n.p.o./too somnolent to tolerate p.o. intake.  Placed on sensitive sliding scale insulin  every 4 hours.  Hypothyroidism Mood disorder Holding p.o. meds at this time.  DVT  prophylaxis: SQ Heparin  Code Status: I have also discussed CODE STATUS with the patient's daughter and she confirms that the patient is DNR (pre-arrest interventions including intubation desired). Family Communication: Daughter at bedside. Consults called: Neurology Level of care: Progressive Care Unit Admission status: It is my clinical opinion that referral for OBSERVATION is reasonable and necessary in this patient based on the above information provided. The aforementioned taken together are felt to place the patient at high risk for further clinical deterioration. However, it is anticipated that the patient may be medically stable for discharge from the hospital within 24 to 48 hours.  Editha Ram MD [facility]  If 7PM-7AM, please contact night-coverage [email or web address]  08/14/2024, 2:08 AM

## 2024-08-14 NOTE — Plan of Care (Signed)
 Family at bedside. Incontinent of bowel and bladder. No skin issues.    Problem: Education: Goal: Ability to describe self-care measures that may prevent or decrease complications (Diabetes Survival Skills Education) will improve Outcome: Progressing   Problem: Skin Integrity: Goal: Risk for impaired skin integrity will decrease Outcome: Progressing   Problem: Nutrition: Goal: Adequate nutrition will be maintained Outcome: Progressing   Problem: Pain Managment: Goal: General experience of comfort will improve and/or be controlled Outcome: Progressing   Problem: Safety: Goal: Ability to remain free from injury will improve Outcome: Progressing   Problem: Skin Integrity: Goal: Risk for impaired skin integrity will decrease Outcome: Progressing

## 2024-08-14 NOTE — ED Notes (Signed)
 Pt found to be unresponsive to NIHSS questions Ezenduka MD aware via secure chat, Plunkette MD ED provider called to bedside.

## 2024-08-15 ENCOUNTER — Inpatient Hospital Stay (HOSPITAL_COMMUNITY)

## 2024-08-15 ENCOUNTER — Other Ambulatory Visit: Payer: Self-pay

## 2024-08-15 DIAGNOSIS — I959 Hypotension, unspecified: Secondary | ICD-10-CM

## 2024-08-15 DIAGNOSIS — R55 Syncope and collapse: Secondary | ICD-10-CM

## 2024-08-15 DIAGNOSIS — G9340 Encephalopathy, unspecified: Secondary | ICD-10-CM

## 2024-08-15 DIAGNOSIS — J9601 Acute respiratory failure with hypoxia: Secondary | ICD-10-CM

## 2024-08-15 DIAGNOSIS — R609 Edema, unspecified: Secondary | ICD-10-CM | POA: Diagnosis not present

## 2024-08-15 LAB — URINE CULTURE

## 2024-08-15 LAB — CBC WITH DIFFERENTIAL/PLATELET
Abs Immature Granulocytes: 0.02 K/uL (ref 0.00–0.07)
Basophils Absolute: 0 K/uL (ref 0.0–0.1)
Basophils Relative: 1 %
Eosinophils Absolute: 0.1 K/uL (ref 0.0–0.5)
Eosinophils Relative: 1 %
HCT: 27.1 % — ABNORMAL LOW (ref 36.0–46.0)
Hemoglobin: 9.1 g/dL — ABNORMAL LOW (ref 12.0–15.0)
Immature Granulocytes: 0 %
Lymphocytes Relative: 42 %
Lymphs Abs: 2.4 K/uL (ref 0.7–4.0)
MCH: 30.1 pg (ref 26.0–34.0)
MCHC: 33.6 g/dL (ref 30.0–36.0)
MCV: 89.7 fL (ref 80.0–100.0)
Monocytes Absolute: 0.4 K/uL (ref 0.1–1.0)
Monocytes Relative: 7 %
Neutro Abs: 2.8 K/uL (ref 1.7–7.7)
Neutrophils Relative %: 49 %
Platelets: 201 K/uL (ref 150–400)
RBC: 3.02 MIL/uL — ABNORMAL LOW (ref 3.87–5.11)
RDW: 12.7 % (ref 11.5–15.5)
WBC: 5.7 K/uL (ref 4.0–10.5)
nRBC: 0 % (ref 0.0–0.2)

## 2024-08-15 LAB — BASIC METABOLIC PANEL WITH GFR
Anion gap: 12 (ref 5–15)
BUN: 24 mg/dL — ABNORMAL HIGH (ref 8–23)
CO2: 24 mmol/L (ref 22–32)
Calcium: 8.8 mg/dL — ABNORMAL LOW (ref 8.9–10.3)
Chloride: 107 mmol/L (ref 98–111)
Creatinine, Ser: 1.72 mg/dL — ABNORMAL HIGH (ref 0.44–1.00)
GFR, Estimated: 29 mL/min — ABNORMAL LOW (ref >60)
Glucose, Bld: 72 mg/dL (ref 70–99)
Potassium: 4.3 mmol/L (ref 3.5–5.1)
Sodium: 143 mmol/L (ref 135–145)

## 2024-08-15 LAB — VITAMIN B12: Vitamin B-12: 442 pg/mL (ref 180–914)

## 2024-08-15 LAB — GLUCOSE, CAPILLARY
Glucose-Capillary: 124 mg/dL — ABNORMAL HIGH (ref 70–99)
Glucose-Capillary: 61 mg/dL — ABNORMAL LOW (ref 70–99)
Glucose-Capillary: 72 mg/dL (ref 70–99)
Glucose-Capillary: 74 mg/dL (ref 70–99)

## 2024-08-15 LAB — FOLATE: Folate: 4 ng/mL — ABNORMAL LOW (ref >5.9)

## 2024-08-15 MED ORDER — FOLIC ACID 1 MG PO TABS: 1.0000 mg | ORAL_TABLET | Freq: Every day | ORAL | Status: DC

## 2024-08-15 MED ORDER — SODIUM CHLORIDE 0.9 % IV SOLN: 1.0000 mg | Freq: Once | INTRAVENOUS | Status: DC

## 2024-08-15 MED ORDER — FOLIC ACID 1 MG PO TABS
1.0000 mg | ORAL_TABLET | Freq: Every day | ORAL | Status: DC
  Administered 2024-08-15: 1 mg via ORAL
  Filled 2024-08-15: qty 1

## 2024-08-15 MED ORDER — HALOPERIDOL 5 MG PO TABS: 5.0000 mg | ORAL_TABLET | Freq: Every evening | ORAL | Status: AC | PRN

## 2024-08-15 MED ORDER — FOLIC ACID 1 MG PO TABS: 1.0000 mg | ORAL_TABLET | Freq: Every day | ORAL | 3 refills | Status: AC

## 2024-08-15 MED ORDER — ALPRAZOLAM 0.25 MG PO TABS
0.2500 mg | ORAL_TABLET | Freq: Once | ORAL | Status: AC
  Administered 2024-08-15: 0.25 mg via ORAL
  Filled 2024-08-15: qty 1

## 2024-08-15 NOTE — Discharge Summary (Signed)
 Physician Discharge Summary   Patient: Diana Harper MRN: [id number] DOB: 10/01/1936  Admit date:     08/13/2024  Discharge date: 08/15/24  Discharge Physician: Elgie Butter   PCP: Waylan Almarie SAUNDERS, MD   Recommendations at discharge:  PLEASE FOLLOW UP WITH PCP [facility] MD AS RECOMMENDED.    Discharge Diagnoses: Principal Problem:   Acute encephalopathy Active Problems:   Syncope   Sinus bradycardia   Hypotension   Acute hypoxemic respiratory failure (HCC)   Seizures (HCC)  Resolved Problems:   * No resolved hospital problems. *  Hospital Course: 88 year old female with history of advanced dementia who is bedbound at baseline, history of CVA, CKD stage IV, diabetes mellitus type 2, vasovagal syncope presents to the ED for evaluation of syncope. En route via EMS, noted to be bradycardic.  While in the ED, noted to have a tonic-clonic seizure (no previous history of seizures). CT head unremarkable for any acute intracranial abnormality.  Neurology consulted, recommended EEG and started patient on IV Depacon .  Patient admitted for further management.    Echo done showed EF of 60 to 65% grade 1 diastolic dysfunction.  D-dimer elevated, VQ scan negative for any PE.      Assessment and Plan:  Acute encephalopathy in setting of baseline advanced dementia ? Seizures or orthostatic hypotension.  She is alert and appears to be at baseline as per the daughter.  EEG did not show any etiopathogenicity.  No indication of seizure medication as per neurology.  Initially MRI was considered but after talking to the daughter patient is unable to lay still for the duration of the MRI and its not worth sedating the patient as she is close to her baseline.     Syncope In the setting of suspected seizure although she does have history of recurrent vasovagal syncope.  Bradycardic and hypotensive initially but now improved.   Echo showed LVEF of 60 % ,with  normal function, and no regional wall  motion abn.  EEG is negative for seizures.     Sinus bradycardia HR improved.    Hypotension Resolved . Holding coreg .    Acute hypoxemic respiratory failure In the setting of problems listed above.  Initially required BVM ventilations by EMS on the way to the ED.  Chest x-ray showing no active disease.   History of stroke Continue with aspirin  81 mg daily.    Chronic normocytic anemia Hemoglobin at baseline.   CKD stage IV Creatinine at baseline.   Insulin -dependent type 2 diabetes A1c 6.1 in November 2024, repeat ordered.  Holding home long-acting insulin  at this time as patient is n.p.o./too somnolent to tolerate p.o. intake.  Placed on sensitive sliding scale insulin  every 4 hours.   Hypothyroidism Mood disorder Holding p.o. meds at this time.  Consultants: none.  Procedures performed: v/q scan.   Disposition: Home Diet recommendation:  Discharge Diet Orders (From admission, onward)     Start     Ordered   08/15/24 0000  Diet - low sodium heart healthy        08/15/24 1009           Regular diet DISCHARGE MEDICATION: Allergies as of 08/15/2024       Reactions   Latex Anaphylaxis, Shortness Of Breath, Other (See Comments)   Severe respiratory distress   Onion Diarrhea   Other Other (See Comments)   Patient cannot have TREE NUTS and anything with seeds- History of  diverticulitis    Flexeril  [cyclobenzaprine ] Other (See Comments)  Caused agitation   Lisinopril Cough   Metformin And Related Diarrhea   Simvastatin Hives, Rash   Tetracycline Rash   Zithromax [azithromycin] Rash        Medication List     STOP taking these medications    carvedilol  3.125 MG tablet Commonly known as: COREG    insulin  degludec 200 UNIT/ML FlexTouch Pen Commonly known as: TRESIBA        TAKE these medications    acetaminophen  500 MG tablet Commonly known as: TYLENOL  Take 1,000 mg by mouth daily as needed for mild pain (pain score 1-3).   ALPRAZolam  0.5 MG  tablet Commonly known as: XANAX  Take 0.25 mg by mouth at bedtime.   amLODipine  10 MG tablet Commonly known as: NORVASC  Take 1 tablet (10 mg total) by mouth daily. What changed: Another medication with the same name was removed. Continue taking this medication, and follow the directions you see here.   aspirin  EC 81 MG tablet Take 1 tablet (81 mg total) by mouth daily. Swallow whole.   feeding supplement Liqd Take 237 mLs by mouth 2 (two) times daily between meals.   folic acid  1 MG tablet Commonly known as: FOLVITE  Take 1 tablet (1 mg total) by mouth daily.   FreeStyle Libre 14 Day Sensor Misc Inject 1 Device into the skin every 14 (fourteen) days.   FreeStyle Ardoch 2 Reader Espiridion See admin instructions.   Glucagon HCl 1 MG Solr Inject 1 mg into the skin as needed (for a blood sugar <67).   haloperidol  5 MG tablet Commonly known as: HALDOL  Take 1 tablet (5 mg total) by mouth at bedtime as needed for agitation. What changed:  when to take this reasons to take this   levothyroxine  88 MCG tablet Commonly known as: SYNTHROID  Take 88 mcg by mouth daily before breakfast.   nitroGLYCERIN  0.4 MG SL tablet Commonly known as: NITROSTAT  Place 1 tablet (0.4 mg total) under the tongue every 5 (five) minutes as needed for chest pain.   sertraline  50 MG tablet Commonly known as: ZOLOFT  Take 50 mg by mouth at bedtime.   sorbitol 70 % solution Take 15-30 mLs by mouth daily as needed (for constipation).   Vitamin D3 125 MCG (5000 UT) Caps Take 5,000 Units by mouth daily.        Discharge Exam: Filed Weights   08/13/24 2131  Weight: 61.1 kg   General exam: Appears calm and comfortable  Respiratory system: Clear to auscultation. Respiratory effort normal. Cardiovascular system: S1 & S2 heard, RRR.  Gastrointestinal system: Abdomen is nondistended, soft and nontender.  Central nervous system: Alert and oriented to self.  Extremities: no edema.  Skin: No rashes,      Condition at discharge: fair  The results of significant diagnostics from this hospitalization (including imaging, microbiology, ancillary and laboratory) are listed below for reference.   Imaging Studies: VAS US  LOWER EXTREMITY VENOUS (DVT) Result Date: 08/15/2024  Lower Venous DVT Study Patient Name:  MARQUISA SALIH  Date of Exam:   08/15/2024 Medical Rec #: [id number]       Accession #:    [id number] Date of Birth: 1936/06/18      Patient Gender: F Patient Age:   24 years Exam Location:  [facility] Procedure:      VAS US  LOWER EXTREMITY VENOUS (DVT) Referring Phys: NKEIRUKA EZENDUKA --------------------------------------------------------------------------------  Indications: Edema, Pain, and Elevated d-dimer. Other Indications: Patient presented with having a seizure. Comparison Study: No priors. Performing Technologist: Ricka Sturdivant-Jones RDMS, RVT  Examination Guidelines: A complete evaluation includes B-mode imaging, spectral Doppler, color Doppler, and power Doppler as needed of all accessible portions of each vessel. Bilateral testing is considered an integral part of a complete examination. Limited examinations for reoccurring indications may be performed as noted. The reflux portion of the exam is performed with the patient in reverse Trendelenburg.  +---------+---------------+---------+-----------+----------+--------------+ RIGHT    CompressibilityPhasicitySpontaneityPropertiesThrombus Aging +---------+---------------+---------+-----------+----------+--------------+ CFV      Full           Yes      Yes                                 +---------+---------------+---------+-----------+----------+--------------+ SFJ      Full                                                        +---------+---------------+---------+-----------+----------+--------------+ FV Prox  Full                                                         +---------+---------------+---------+-----------+----------+--------------+ FV Mid   Full           Yes      Yes                                 +---------+---------------+---------+-----------+----------+--------------+ FV DistalFull                                                        +---------+---------------+---------+-----------+----------+--------------+ PFV      Full                                                        +---------+---------------+---------+-----------+----------+--------------+ POP      Full           Yes      Yes                                 +---------+---------------+---------+-----------+----------+--------------+ PTV      Full                                                        +---------+---------------+---------+-----------+----------+--------------+ PERO     Full                                                        +---------+---------------+---------+-----------+----------+--------------+   +---------+---------------+---------+-----------+----------+--------------+  LEFT     CompressibilityPhasicitySpontaneityPropertiesThrombus Aging +---------+---------------+---------+-----------+----------+--------------+ CFV      Full           Yes      Yes                                 +---------+---------------+---------+-----------+----------+--------------+ SFJ      Full                                                        +---------+---------------+---------+-----------+----------+--------------+ FV Prox  Full                                                        +---------+---------------+---------+-----------+----------+--------------+ FV Mid   Full           Yes      Yes                                 +---------+---------------+---------+-----------+----------+--------------+ FV DistalFull                                                         +---------+---------------+---------+-----------+----------+--------------+ PFV      Full                                                        +---------+---------------+---------+-----------+----------+--------------+ POP      Full           Yes      Yes                                 +---------+---------------+---------+-----------+----------+--------------+ PTV      Full                                                        +---------+---------------+---------+-----------+----------+--------------+ PERO     Full                                                        +---------+---------------+---------+-----------+----------+--------------+     Summary: BILATERAL: - No evidence of deep vein thrombosis seen in the lower extremities, bilaterally. -No evidence of popliteal cyst, bilaterally.   *See table(s) above for measurements and observations. Electronically signed by Fonda Rim on 08/15/2024 at 2:48:45 PM.    Final    NM Pulmonary  Perfusion Result Date: 08/14/2024 CLINICAL DATA:  Suspected pulmonary embolism. EXAM: NUCLEAR MEDICINE PERFUSION LUNG SCAN TECHNIQUE: Perfusion images were obtained in multiple projections after intravenous injection of radiopharmaceutical. Ventilation scans intentionally deferred if perfusion scan and chest x-ray adequate for interpretation during COVID 19 epidemic. RADIOPHARMACEUTICALS:  4.4 mCi Tc-64m MAA IV COMPARISON:  Chest plain film, dated August 13, 2024. FINDINGS: Predominantly homogeneous distribution of tracer activity is noted throughout both lungs. No segmental or subsegmental perfusion defects are identified. IMPRESSION: No evidence to suggest the presence of acute pulmonary embolism. Electronically Signed   By: Suzen Dials M.D.   On: 08/14/2024 15:14   ECHOCARDIOGRAM COMPLETE Result Date: 08/14/2024    ECHOCARDIOGRAM REPORT   Patient Name:   ANYJAH ROUNDTREE Date of Exam: 08/14/2024 Medical Rec #:  [id number]      Height:        66.0 in Accession #:    [id number]     Weight:       134.7 lb Date of Birth:  11/04/1936     BSA:          1.691 m Patient Age:    87 years       BP:           185/118 mmHg Patient Gender: F              HR:           56 bpm. Exam Location:  Inpatient Procedure: 2D Echo (Both Spectral and Color Flow Doppler were utilized during            procedure). Indications:    syncope  History:        Patient has prior history of Echocardiogram examinations, most                 recent 10/12/2023. Chronic kidney disease. history of stroke.;                 Risk Factors:Diabetes.  Sonographer:    Tinnie Barefoot RDCS Referring Phys: [id number] VASUNDHRA RATHORE IMPRESSIONS  1. Left ventricular ejection fraction, by estimation, is 60 to 65%. The left ventricle has normal function. The left ventricle has no regional wall motion abnormalities. Left ventricular diastolic parameters are consistent with Grade I diastolic dysfunction (impaired relaxation).  2. Right ventricular systolic function is normal. The right ventricular size is normal.  3. The mitral valve is normal in structure. No evidence of mitral valve regurgitation. No evidence of mitral stenosis.  4. The aortic valve is calcified. There is severe focal calcification of the noncoronary cusp. Aortic valve regurgitation is not visualized. No aortic stenosis is present. FINDINGS  Left Ventricle: Left ventricular ejection fraction, by estimation, is 60 to 65%. The left ventricle has normal function. The left ventricle has no regional wall motion abnormalities. The left ventricular internal cavity size was normal in size. There is  no left ventricular hypertrophy. Left ventricular diastolic parameters are consistent with Grade I diastolic dysfunction (impaired relaxation). Right Ventricle: The right ventricular size is normal. No increase in right ventricular wall thickness. Right ventricular systolic function is normal. Left Atrium: Left atrial size was normal in size.  Right Atrium: Right atrial size was normal in size. Pericardium: There is no evidence of pericardial effusion. Mitral Valve: The mitral valve is normal in structure. No evidence of mitral valve regurgitation. No evidence of mitral valve stenosis. Tricuspid Valve: The tricuspid valve is normal in structure. Tricuspid valve regurgitation is not demonstrated. No evidence of tricuspid  stenosis. Aortic Valve: The aortic valve is calcified. Aortic valve regurgitation is not visualized. No aortic stenosis is present. Aortic valve mean gradient measures 11.0 mmHg. Aortic valve peak gradient measures 21.3 mmHg. Aortic valve area, by VTI measures 1.07 cm. Pulmonic Valve: The pulmonic valve was normal in structure. Pulmonic valve regurgitation is not visualized. No evidence of pulmonic stenosis. Aorta: The aortic root is normal in size and structure. IAS/Shunts: No atrial level shunt detected by color flow Doppler.  LEFT VENTRICLE PLAX 2D LVIDd:         4.80 cm   Diastology LVIDs:         2.60 cm   LV e' medial:    4.03 cm/s LV PW:         1.00 cm   LV E/e' medial:  17.5 LV IVS:        1.00 cm   LV e' lateral:   5.22 cm/s LVOT diam:     1.60 cm   LV E/e' lateral: 13.5 LV SV:         55 LV SV Index:   32 LVOT Area:     2.01 cm  RIGHT VENTRICLE RV Basal diam:  2.70 cm RV S prime:     12.90 cm/s TAPSE (M-mode): 2.2 cm LEFT ATRIUM             Index        RIGHT ATRIUM           Index LA diam:        3.30 cm 1.95 cm/m   RA Area:     11.50 cm LA Vol (A2C):   36.1 ml 21.35 ml/m  RA Volume:   25.10 ml  14.85 ml/m LA Vol (A4C):   43.1 ml 25.49 ml/m LA Biplane Vol: 40.4 ml 23.90 ml/m  AORTIC VALVE                     PULMONIC VALVE AV Area (Vmax):    1.03 cm      PR End Diast Vel: 5.66 msec AV Area (Vmean):   1.04 cm AV Area (VTI):     1.07 cm AV Vmax:           231.00 cm/s AV Vmean:          153.000 cm/s AV VTI:            0.512 m AV Peak Grad:      21.3 mmHg AV Mean Grad:      11.0 mmHg LVOT Vmax:         118.50 cm/s LVOT  Vmean:        78.800 cm/s LVOT VTI:          0.272 m LVOT/AV VTI ratio: 0.53  AORTA Ao Root diam: 3.00 cm Ao Asc diam:  3.00 cm MITRAL VALVE MV Area (PHT): 3.51 cm     SHUNTS MV Decel Time: 216 msec     Systemic VTI:  0.27 m MV E velocity: 70.40 cm/s   Systemic Diam: 1.60 cm MV A velocity: 127.00 cm/s MV E/A ratio:  0.55 Aditya Sabharwal Electronically signed by Ria Commander Signature Date/Time: 08/14/2024/1:45:20 PM    Final    EEG adult Result Date: 08/14/2024 Shelton Arlin KIDD, MD     08/14/2024 10:57 AM Patient Name: Eula Jaster MRN: [id number] Epilepsy Attending: Arlin KIDD Shelton Referring Physician/Provider: Merrianne Locus, MD Date: 08/14/2024 Duration: 21.06 mins Patient history: 88 year old female with syncope followed by  seizure-like episode.  EEG to evaluate for seizure. Level of alertness: Awake AEDs during EEG study: None Technical aspects: This EEG study was done with scalp electrodes positioned according to the 10-20 International system of electrode placement. Electrical activity was reviewed with band pass filter of 1-70Hz , sensitivity of 7 uV/mm, display speed of 87mm/sec with a 60Hz  notched filter applied as appropriate. EEG data were recorded continuously and digitally stored.  Video monitoring was available and reviewed as appropriate. Description: EEG showed continuous generalized 3 to 6 Hz theta-delta slowing. Hyperventilation and photic stimulation were not performed.   ABNORMALITY - Continuous slow, generalized IMPRESSION: This study is suggestive of moderate diffuse encephalopathy. No seizures or epileptiform discharges were seen throughout the recording. Arlin MALVA Krebs   DG Chest Portable 1 View Result Date: 08/13/2024 CLINICAL DATA:  Recent syncopal episode EXAM: PORTABLE CHEST 1 VIEW COMPARISON:  02/06/2024 FINDINGS: Cardiac shadow is within normal limits. Lungs are well aerated bilaterally. No focal infiltrate or effusion is seen. No bony abnormality is noted. IMPRESSION: No  active disease. Electronically Signed   By: Oneil Devonshire M.D.   On: 08/13/2024 22:13   CT HEAD CODE STROKE WO CONTRAST Result Date: 08/13/2024 CLINICAL DATA:  Code stroke.  Neuro deficit, acute, stroke suspected EXAM: CT HEAD WITHOUT CONTRAST TECHNIQUE: Contiguous axial images were obtained from the base of the skull through the vertex without intravenous contrast. RADIATION DOSE REDUCTION: This exam was performed according to the departmental dose-optimization program which includes automated exposure control, adjustment of the mA and/or kV according to patient size and/or use of iterative reconstruction technique. COMPARISON:  CT head 11/30/2023 FINDINGS: Brain: No evidence of acute large vascular territory infarction, hemorrhage, hydrocephalus, extra-axial collection or mass lesion/mass effect. Similar severe patchy and confluent white matter hypodensities, compatible chronic microvascular ischemic disease. Remote bilateral basal ganglia lacunar infarct. Vascular: No hyperdense vessel identified. Calcific atherosclerosis. Skull: No acute fracture. Sinuses/Orbits: Clear sinuses.  No acute orbital findings. Other: No mastoid effusions. ASPECTS [facility] Stroke Program Early CT Score) Total score (0-10 with 10 being normal): 10 IMPRESSION: 1. No evidence of acute intracranial abnormality. ASPECTS is 10. 2. Severe chronic microvascular ischemic disease. Code stroke imaging results were communicated on 08/13/2024 at 9:44 pm to provider Dr. Lindzen via secure text paging. Electronically Signed   By: Gilmore GORMAN Molt M.D.   On: 08/13/2024 21:45    Microbiology: Results for orders placed or performed during the hospital encounter of 08/13/24  Urine Culture (for pregnant, neutropenic or urologic patients or patients with an indwelling urinary catheter)     Status: Abnormal   Collection Time: 08/14/24  3:54 AM   Specimen: Urine, Clean Catch  Result Value Ref Range Status   Specimen Description URINE, CLEAN CATCH   Final   Special Requests   Final    NONE Performed at [facility], [address], [place], [place] [address]    Culture MULTIPLE SPECIES PRESENT, SUGGEST RECOLLECTION (A)  Final   Report Status 08/15/2024 FINAL  Final  Culture, blood (Routine X 2) w Reflex to ID Panel     Status: None (Preliminary result)   Collection Time: 08/14/24  7:09 AM   Specimen: BLOOD  Result Value Ref Range Status   Specimen Description BLOOD LEFT ANTECUBITAL  Final   Special Requests   Final    BOTTLES DRAWN AEROBIC AND ANAEROBIC Blood Culture adequate volume   Culture   Final    NO GROWTH < 24 HOURS Performed at [facility],  [address] [place], [place] [address]    Report Status PENDING  Incomplete  Resp panel by RT-PCR (RSV, Flu A&B, Covid) Anterior Nasal Swab     Status: None   Collection Time: 08/14/24  9:27 AM   Specimen: Anterior Nasal Swab  Result Value Ref Range Status   SARS Coronavirus 2 by RT PCR NEGATIVE NEGATIVE Final   Influenza A by PCR NEGATIVE NEGATIVE Final   Influenza B by PCR NEGATIVE NEGATIVE Final    Comment: (NOTE) The Xpert Xpress SARS-CoV-2/FLU/RSV plus assay is intended as an aid in the diagnosis of influenza from Nasopharyngeal swab specimens and should not be used as a sole basis for treatment. Nasal washings and aspirates are unacceptable for Xpert Xpress SARS-CoV-2/FLU/RSV testing.  Fact Sheet for Patients: BloggerCourse.com  Fact Sheet for Healthcare Providers: SeriousBroker.it  This test is not yet approved or cleared by the [place]  FDA and has been authorized for detection and/or diagnosis of SARS-CoV-2 by FDA under an Emergency Use Authorization (EUA). This EUA will remain in effect (meaning this test can be used) for the duration of the COVID-19 declaration under Section 564(b)(1) of the Act, 21 U.S.C. section 360bbb-3(b)(1), unless the authorization is terminated or revoked.      Resp Syncytial Virus by PCR NEGATIVE NEGATIVE Final    Comment: (NOTE) Fact Sheet for Patients: BloggerCourse.com  Fact Sheet for Healthcare Providers: SeriousBroker.it  This test is not yet approved or cleared by the [place]  FDA and has been authorized for detection and/or diagnosis of SARS-CoV-2 by FDA under an Emergency Use Authorization (EUA). This EUA will remain in effect (meaning this test can be used) for the duration of the COVID-19 declaration under Section 564(b)(1) of the Act, 21 U.S.C. section 360bbb-3(b)(1), unless the authorization is terminated or revoked.  Performed at [facility], [address], [place], [place] [address]   Culture, blood (Routine X 2) w Reflex to ID Panel     Status: None (Preliminary result)   Collection Time: 08/14/24  7:15 PM   Specimen: BLOOD LEFT HAND  Result Value Ref Range Status   Specimen Description BLOOD LEFT HAND  Final   Special Requests   Final    BOTTLES DRAWN AEROBIC ONLY Blood Culture results may not be optimal due to an inadequate volume of blood received in culture bottles   Culture   Final    NO GROWTH < 24 HOURS Performed at [facility], [address] [place], [place] [address]    Report Status PENDING  Incomplete    Labs: CBC: Recent Labs  Lab 08/13/24 2120 08/13/24 2132 08/15/24 0141  WBC 6.8  --  5.7  NEUTROABS 3.2  --  2.8  HGB 11.5* 10.9* 9.1*  HCT 34.6* 32.0* 27.1*  MCV 91.1  --  89.7  PLT 241  --  201   Basic Metabolic Panel: Recent Labs  Lab 08/13/24 2120 08/13/24 2132 08/14/24 0913 08/15/24 0141  NA 141 142 143 143  K 4.5 4.1 4.4 4.3  CL 106 108 109 107  CO2 24  --  22 24  GLUCOSE 145* 138* 68* 72  BUN 30* 29* 25* 24*  CREATININE 1.99* 2.10* 1.70* 1.72*  CALCIUM  9.2  --  8.7* 8.8*   Liver Function Tests: Recent Labs  Lab 08/13/24 2120  AST 25  ALT 17  ALKPHOS 52  BILITOT 0.4  PROT 6.2*  ALBUMIN  3.3*    CBG: Recent Labs  Lab 08/14/24 2034 08/15/24  0011 08/15/24 0458 08/15/24 0845 08/15/24 1032  GLUCAP 79 74 72 61* 124*    Discharge time spent: 41 minutes.   Signed: Elgie Butter, MD [facility] 08/15/2024

## 2024-08-15 NOTE — Plan of Care (Signed)
  Problem: Coping: Goal: Ability to adjust to condition or change in health will improve Outcome: Progressing   Problem: Fluid Volume: Goal: Ability to maintain a balanced intake and output will improve Outcome: Progressing   Problem: Health Behavior/Discharge Planning: Goal: Ability to manage health-related needs will improve Outcome: Progressing   P Problem: Nutritional: Goal: Maintenance of adequate nutrition will improve Outcome: Progressing   Problem: Skin Integrity: Goal: Risk for impaired skin integrity will decrease Outcome: Progressing   Problem: Clinical Measurements: Goal: Will remain free from infection Outcome: Progressing   Problem: Clinical Measurements: Goal: Respiratory complications will improve Outcome: Progressing   Problem: Activity: Goal: Risk for activity intolerance will decrease Outcome: Progressing   Problem: Nutrition: Goal: Adequate nutrition will be maintained Outcome: Progressing   Problem: Coping: Goal: Level of anxiety will decrease Outcome: Progressing   Problem: Elimination: Goal: Will not experience complications related to bowel motility Outcome: Progressing   Problem: Elimination: Goal: Will not experience complications related to urinary retention Outcome: Progressing   Problem: Safety: Goal: Ability to remain free from injury will improve Outcome: Progressing

## 2024-08-15 NOTE — Plan of Care (Signed)

## 2024-08-15 NOTE — TOC Initial Note (Signed)
 Transition of Care [facility]) - Initial/Assessment Note    Patient Details  Name: Diana Harper MRN: [id number] Date of Birth: 30-Dec-1935  [facility]) CM/SW Contact:    Nola Devere Hands, RN Phone Number: 08/15/2024, 3:59 PM  Clinical Narrative:                 Case manager hs spoken with patient's daughter Jackson ,concerning patient's discharge.Address confirmed.She is ready for her mom to go home.  Patient is active with [facility] and will transport home via EMS. Medical Necessity form completed, Ambulance should arrive within an hour. No further needs identified.    Expected Discharge Plan: Home w Hospice Care Barriers to Discharge: No Barriers Identified   Patient Goals and CMS Choice            Expected Discharge Plan and Services         Expected Discharge Date: 08/15/24                                    Prior Living Arrangements/Services                       Activities of Daily Living   ADL Screening (condition at time of admission) Independently performs ADLs?: No Does the patient have a NEW difficulty with bathing/dressing/toileting/self-feeding that is expected to last >3 days?: Yes (Initiates electronic notice to provider for possible OT consult) Does the patient have a NEW difficulty with getting in/out of bed, walking, or climbing stairs that is expected to last >3 days?: Yes (Initiates electronic notice to provider for possible PT consult) Does the patient have a NEW difficulty with communication that is expected to last >3 days?: Yes (Initiates electronic notice to provider for possible SLP consult) Is the patient deaf or have difficulty hearing?: No Does the patient have difficulty seeing, even when wearing glasses/contacts?: Yes Does the patient have difficulty concentrating, remembering, or making decisions?: Yes  Permission Sought/Granted                  Emotional Assessment               Admission diagnosis:  Syncope and collapse [R55] Seizure (HCC) [R56.9] Seizures (HCC) [R56.9] Unconsciousness (HCC) [R40.20] Patient Active Problem List   Diagnosis Date Noted   Sinus bradycardia 08/14/2024   Hypotension 08/14/2024   Acute hypoxemic respiratory failure (HCC) 08/14/2024   Seizures (HCC) 08/14/2024   Influenza A 02/03/2024   Hypertensive urgency 10/08/2023   Dementia without behavioral disturbance (HCC) 10/08/2023   Acute ischemic stroke (HCC) 04/18/2023   Myelopathy (HCC) 01/09/2023   Acute metabolic encephalopathy 09/15/2022   Vitamin B12 deficiency 09/15/2022   Thiamine  deficiency 09/15/2022   Back pain 09/15/2022   CKD (chronic kidney disease), stage IV (HCC) 09/15/2022   Hypokalemia 09/15/2022   AMS (altered mental status) 09/12/2022   C. difficile colitis 07/25/2022   Anemia 07/24/2022   Uncontrolled type 2 diabetes mellitus with hyperglycemia, without long-term current use of insulin  (HCC) 07/17/2022   Acute diverticulitis 04/06/2021   CKD stage 3 due to type 2 diabetes mellitus (HCC) 04/06/2021   Essential hypertension 04/06/2021   Hypothyroidism    Depression    COPD (chronic obstructive pulmonary disease) (HCC)    Anorexia 02/11/2021   Bloating symptom 02/11/2021   History of colon polyps 02/09/2021   Hemorrhoids 02/09/2021   Rectal bleeding 02/09/2021  Fecal smearing 02/09/2021   Acute renal failure superimposed on stage 3a chronic kidney disease (HCC) 12/13/2019   Acute kidney injury (HCC) 12/12/2019   Diarrhea 12/12/2019   Dehydration 12/12/2019   Cerebral thrombosis with cerebral infarction 10/10/2017   Cerebral embolism with cerebral infarction 10/10/2017   Subarachnoid hemorrhage 10/10/2017   Acute encephalopathy 10/10/2017   History of CVA (cerebrovascular accident) 10/10/2017   Chronic cholecystitis with calculus 08/25/2016   Syncope 11/15/2011   Diverticulitis 11/15/2011   PCP:  Waylan Almarie SAUNDERS, MD Pharmacy:   [facility] [address] [place] - [address] [place] [address] Phone: [phone] Fax: [phone]  [name] [address] [place] [address] Phone: [phone] Fax: [phone]  [facility] - [facility] [address] [place] [address] Phone: [phone] Fax: [phone]     Social Drivers of Health (SDOH) Social History: SDOH Screenings   Food Insecurity: No Food Insecurity (08/15/2024)  Housing: Low Risk  (08/15/2024)  Transportation Needs: Unmet Transportation Needs (08/15/2024)  Utilities: Not At Risk (08/15/2024)  Social Connections: Socially Isolated (08/15/2024)  Tobacco Use: Low Risk  (08/13/2024)   SDOH Interventions:     Readmission Risk Interventions    02/07/2024    1:50 PM 07/27/2022   10:46 AM  Readmission Risk Prevention Plan  Transportation Screening Complete Complete  PCP or Specialist Appt within 3-5 Days  Complete  HRI or Home Care Consult  Complete  Social Work Consult for Recovery Care Planning/Counseling  Complete  Palliative Care Screening  Not Applicable  Medication Review [employer]) Referral to Pharmacy Complete  PCP or Specialist appointment within 3-5 days of discharge Complete   HRI or Home Care Consult Complete   SW Recovery Care/Counseling Consult Complete   Palliative Care Screening Not Applicable   Skilled Nursing Facility Not Applicable

## 2024-08-15 NOTE — Progress Notes (Signed)
 Venous duplex lower ext  has been completed. Refer to [facility] under chart review to view preliminary results.   08/15/2024  10:58 AM Felita Bump, Ricka BIRCH

## 2024-08-17 DIAGNOSIS — E1121 Type 2 diabetes mellitus with diabetic nephropathy: Secondary | ICD-10-CM | POA: Diagnosis not present

## 2024-08-17 DIAGNOSIS — E538 Deficiency of other specified B group vitamins: Secondary | ICD-10-CM | POA: Diagnosis not present

## 2024-08-17 DIAGNOSIS — F0390 Unspecified dementia without behavioral disturbance: Secondary | ICD-10-CM | POA: Diagnosis not present

## 2024-08-17 DIAGNOSIS — Z09 Encounter for follow-up examination after completed treatment for conditions other than malignant neoplasm: Secondary | ICD-10-CM | POA: Diagnosis not present

## 2024-08-17 DIAGNOSIS — I679 Cerebrovascular disease, unspecified: Secondary | ICD-10-CM | POA: Diagnosis not present

## 2024-08-17 DIAGNOSIS — I509 Heart failure, unspecified: Secondary | ICD-10-CM | POA: Diagnosis not present

## 2024-08-17 DIAGNOSIS — Z9189 Other specified personal risk factors, not elsewhere classified: Secondary | ICD-10-CM | POA: Diagnosis not present

## 2024-08-19 LAB — CULTURE, BLOOD (ROUTINE X 2)
Culture: NO GROWTH
Culture: NO GROWTH
Special Requests: ADEQUATE

## 2024-10-29 DEATH — deceased
